# Patient Record
Sex: Female | Born: 1940
Health system: Southern US, Community
[De-identification: ages and names within clinical notes are randomized; demographics above are authoritative.]

## PROBLEM LIST (undated history)

## (undated) DIAGNOSIS — Z9289 Personal history of other medical treatment: Secondary | ICD-10-CM

## (undated) DIAGNOSIS — D649 Anemia, unspecified: Secondary | ICD-10-CM

## (undated) DIAGNOSIS — K759 Inflammatory liver disease, unspecified: Secondary | ICD-10-CM

## (undated) DIAGNOSIS — C3490 Malignant neoplasm of unspecified part of unspecified bronchus or lung: Secondary | ICD-10-CM

## (undated) DIAGNOSIS — I10 Essential (primary) hypertension: Secondary | ICD-10-CM

## (undated) DIAGNOSIS — N186 End stage renal disease: Secondary | ICD-10-CM

## (undated) DIAGNOSIS — Z992 Dependence on renal dialysis: Secondary | ICD-10-CM

## (undated) DIAGNOSIS — E785 Hyperlipidemia, unspecified: Secondary | ICD-10-CM

## (undated) DIAGNOSIS — Z85528 Personal history of other malignant neoplasm of kidney: Secondary | ICD-10-CM

## (undated) HISTORY — DX: Hyperlipidemia, unspecified: E78.5

## (undated) HISTORY — PX: CATARACT EXTRACTION: SUR2

---

## 2002-08-04 ENCOUNTER — Encounter: Payer: Self-pay | Admitting: Emergency Medicine

## 2002-08-04 ENCOUNTER — Inpatient Hospital Stay (HOSPITAL_COMMUNITY): Admission: EM | Admit: 2002-08-04 | Discharge: 2002-08-07 | Payer: Self-pay | Admitting: Emergency Medicine

## 2002-08-04 ENCOUNTER — Encounter (INDEPENDENT_AMBULATORY_CARE_PROVIDER_SITE_OTHER): Payer: Self-pay | Admitting: *Deleted

## 2002-08-08 ENCOUNTER — Encounter: Payer: Self-pay | Admitting: Gastroenterology

## 2002-08-08 ENCOUNTER — Ambulatory Visit (HOSPITAL_COMMUNITY): Admission: RE | Admit: 2002-08-08 | Discharge: 2002-08-08 | Payer: Self-pay | Admitting: Gastroenterology

## 2005-04-27 ENCOUNTER — Emergency Department (HOSPITAL_COMMUNITY): Admission: EM | Admit: 2005-04-27 | Discharge: 2005-04-27 | Payer: Self-pay | Admitting: Family Medicine

## 2009-04-03 ENCOUNTER — Ambulatory Visit (HOSPITAL_COMMUNITY): Admission: RE | Admit: 2009-04-03 | Discharge: 2009-04-03 | Payer: Self-pay | Admitting: Family Medicine

## 2009-08-03 ENCOUNTER — Observation Stay (HOSPITAL_COMMUNITY): Admission: EM | Admit: 2009-08-03 | Discharge: 2009-08-04 | Payer: Self-pay | Admitting: Emergency Medicine

## 2011-03-21 LAB — HEPATIC FUNCTION PANEL
ALT: 33 U/L (ref 0–35)
Total Protein: 7.5 g/dL (ref 6.0–8.3)

## 2011-03-21 LAB — DIFFERENTIAL
Basophils Absolute: 0 10*3/uL (ref 0.0–0.1)
Eosinophils Absolute: 0.1 10*3/uL (ref 0.0–0.7)
Monocytes Absolute: 1.4 10*3/uL — ABNORMAL HIGH (ref 0.1–1.0)
Neutrophils Relative %: 69 % (ref 43–77)

## 2011-03-21 LAB — CROSSMATCH: Antibody Screen: NEGATIVE

## 2011-03-21 LAB — HAPTOGLOBIN: Haptoglobin: 124 mg/dL (ref 16–200)

## 2011-03-21 LAB — BASIC METABOLIC PANEL
BUN: 10 mg/dL (ref 6–23)
CO2: 27 mEq/L (ref 19–32)
Calcium: 9.2 mg/dL (ref 8.4–10.5)
Chloride: 106 mEq/L (ref 96–112)
Creatinine, Ser: 0.59 mg/dL (ref 0.4–1.2)
GFR calc Af Amer: >60 mL/min (ref >60)
GFR calc non Af Amer: >60 mL/min (ref >60)
Glucose, Bld: 115 mg/dL — ABNORMAL HIGH (ref 70–99)
Potassium: 3.5 mEq/L (ref 3.5–5.1)
Sodium: 138 mEq/L (ref 135–145)

## 2011-03-21 LAB — CBC
HCT: 35.4 % — ABNORMAL LOW (ref 36.0–46.0)
Hemoglobin: 11.3 g/dL — ABNORMAL LOW (ref 12.0–15.0)
MCHC: 29.4 g/dL — ABNORMAL LOW (ref 30.0–36.0)
Platelets: 234 10*3/uL (ref 150–400)
Platelets: 260 10*3/uL (ref 150–400)
Platelets: 300 10*3/uL (ref 150–400)
RDW: 20.4 % — ABNORMAL HIGH (ref 11.5–15.5)
RDW: 23.9 % — ABNORMAL HIGH (ref 11.5–15.5)
WBC: 8.4 10*3/uL (ref 4.0–10.5)
WBC: 8.6 10*3/uL (ref 4.0–10.5)

## 2011-03-21 LAB — URINALYSIS, ROUTINE W REFLEX MICROSCOPIC
Nitrite: NEGATIVE
Specific Gravity, Urine: 1.007 (ref 1.005–1.030)
Urobilinogen, UA: 0.2 mg/dL (ref 0.0–1.0)

## 2011-03-21 LAB — IRON AND TIBC
Iron: 11 ug/dL — ABNORMAL LOW (ref 42–135)
UIBC: 498 ug/dL

## 2011-03-21 LAB — HEMOCCULT GUIAC POC 1CARD (OFFICE): Fecal Occult Bld: POSITIVE

## 2011-03-21 LAB — PROTIME-INR
INR: 1 (ref 0.00–1.49)
Prothrombin Time: 13.4 seconds (ref 11.6–15.2)

## 2011-03-21 LAB — POCT CARDIAC MARKERS: Myoglobin, poc: 49.2 ng/mL (ref 12–200)

## 2011-03-21 LAB — RETICULOCYTES: Retic Ct Pct: 4.2 % — ABNORMAL HIGH (ref 0.4–3.1)

## 2011-03-21 LAB — T4: T4, Total: 8.5 ug/dL (ref 5.0–12.5)

## 2011-03-21 LAB — LACTATE DEHYDROGENASE: LDH: 158 U/L (ref 94–250)

## 2011-03-21 LAB — ABO/RH: ABO/RH(D): O POS

## 2011-03-21 LAB — FERRITIN: Ferritin: <1 ng/mL — ABNORMAL LOW (ref 10–291)

## 2011-03-25 LAB — COMPREHENSIVE METABOLIC PANEL
ALT: 36 U/L — ABNORMAL HIGH (ref 0–35)
Alkaline Phosphatase: 73 U/L (ref 39–117)
BUN: 10 mg/dL (ref 6–23)
CO2: 32 mEq/L (ref 19–32)
GFR calc non Af Amer: >60 mL/min (ref >60)
Glucose, Bld: 131 mg/dL — ABNORMAL HIGH (ref 70–99)
Potassium: 3.9 mEq/L (ref 3.5–5.1)
Total Protein: 7.4 g/dL (ref 6.0–8.3)

## 2011-03-25 LAB — CBC
HCT: 39.7 % (ref 36.0–46.0)
Hemoglobin: 13.1 g/dL (ref 12.0–15.0)
MCHC: 33.1 g/dL (ref 30.0–36.0)
RBC: 5.08 MIL/uL (ref 3.87–5.11)
RDW: 17.9 % — ABNORMAL HIGH (ref 11.5–15.5)

## 2011-03-25 LAB — LIPID PANEL
Cholesterol: 182 mg/dL (ref 0–200)
LDL Cholesterol: 133 mg/dL — ABNORMAL HIGH (ref 0–99)
Total CHOL/HDL Ratio: 4.7 RATIO

## 2011-04-28 NOTE — Discharge Summary (Signed)
Cassandra Dyer, Cassandra Dyer               ACCOUNT NO.:  [id number]   MEDICAL RECORD NO.:  [id number]          PATIENT TYPE:  INP   LOCATION:  [id number]                         FACILITY:  [facility]   PHYSICIAN:  Reyne Dumas, MD       DATE OF BIRTH:  04-11-1941   DATE OF ADMISSION:  08/02/2009  DATE OF DISCHARGE:  08/04/2009                               DISCHARGE SUMMARY   PRIMARY CARE PHYSICIAN:  Silvio Clayman. Kennon Holter, MD.   [facility]   DISCHARGE DIAGNOSES:  1. Microcytic anemia likely secondary to gastrointestinal blood loss.  2. Hypertension.  3. Dyslipidemia.   SUBJECTIVE:  This is a 70 year old African American female with a  history of hypertension, dyslipidemia, and anemia, who presents with  progressive fatigue and weakness over the last 1 month.  The patient  complained of difficulties with climbing stairs.  She had a similar  episode about 7 years ago when she was evaluated extensively by [facility]  and no obvious source of bleeding was found.  Of note is that the  patient had a normal CBC on April 03, 2009, with a hemoglobin of 13.1  and hematocrit of 39.7.  The patient reported that she had started  taking aspirin on her own without notifying her primary care Atticus Lemberger  about 2 months ago prior to this admission.  She denied any hematemesis,  denied any melena, blood in her stool, or black tarry stool.  On initial  admission and assessment, the patient was found to be hemodynamically  stable with systolic blood pressure in the 130s.  She was, however,  found to have a 3/6 systolic ejection murmur probably related to her  anemia.  On admission,  initial hemoglobin was found to be 5.4.  Liver  function tests were within normal limits.  Cardiac markers were  unremarkable.   HOSPITAL COURSE:  1. Anemia:  The patient was found to have microcytic anemia with a      hemoglobin of 5.4.  Initially, she was transfused with 3 units of      packed red blood  cells following which her hemoglobin came up to      10.7.  The patient had normal coagulation profile, no evidence of      any hemolysis with a normal LDH and a normal haptoglobin.  The      patient was otherwise found to be hemodynamically stable.  I      notified Dr. Ronald Lobo, who was on call on August 03, 2009,      about the patient's anemia, and after our discussion it was      concluded that since the patient did not have any acute evidence of      any ongoing bleeding, she can wait for an outpatient workup.  Dr.      Cristina Gong reassured that he will give the patient a call next week.      Since the patient's hemoglobin has been stable without any active      bleeding, the patient is being discharged home.  The patient was up  and about ambulating the hallways at the time of my final      assessment of the patient prior to discharge.  She is found to be      100% on room air, not tachycardic, and completely stable.  I have      advised her to call Dr. Cristina Gong first thing next week.  I have      provided her with a phone number for Dr. Osborn Coho office.  2. Hypertension:  The patient was continued on her Norvasc and      hydrochlorothiazide.   DISCHARGE MEDICATIONS:  1. Norvasc 10 mg p.o. daily.  2. Ascorbic acid 500 mg p.o. daily.  3. Ferrous sulfate 325 p.o. t.i.d.  4. Hydrochlorothiazide 25 mg p.o. daily.  5. Protonix 40 mg p.o. twice a day.  6. Pravastatin 20 mg p.o. daily.   MEDICATIONS DISCONTINUED:  Aspirin.      Reyne Dumas, MD  Electronically Signed     NA/MEDQ  D:  08/04/2009  T:  08/04/2009  Job:  [id number]

## 2011-04-28 NOTE — H&P (Signed)
[name], Dyer NO.:  [id number]   MEDICAL RECORD NO.:  [id number]          PATIENT TYPE:  EMS   LOCATION:  MAJO                         FACILITY:  [facility]   PHYSICIAN:  Karlyn Agee, M.D. DATE OF BIRTH:  [birth year]   DATE OF ADMISSION:  08/02/2009  DATE OF DISCHARGE:                              HISTORY & PHYSICAL   PRIMARY CARE PHYSICIAN:  Lindwood Qua, M.D.   CHIEF COMPLAINT:  Chronic fatigue and shortness of breath.   HISTORY OF PRESENT ILLNESS:  This is a 70 year old African American lady  with history of hypertension, hypercholesterolemia and anemia who  presents with progressive fatigue and weakness over the past 1 month.  She has increasing difficulty climbing stairs and feels that her legs  will give out.  She had similar symptoms 7 years ago and was found to be  anemic requiring transfusions.  She had upper and lower endoscopy at  that time and she reports that both were normal.  She has had no chest  pain, dizziness or syncope.  She had no diaphoresis.  She denies any  lower extremity edema.  Says she sleeps very well at night and no PND  nor orthopnea.  The patient was evaluated in the emergency room and  found to be severely anemic and the hospitalist service called to assist  with management.   PAST MEDICAL HISTORY:  1. Anemia.  2. Hypertension.  3. Hyperlipidemia.   MEDICATIONS:  1. Pravastatin 20 mg daily.  2. Hydrochlorothiazide 25 mg daily.  3. Amlodipine 10 mg daily.  4. Aspirin 325 mg daily.  5. Vitamin C daily.   ALLERGIES:  No known drug allergies.   SOCIAL HISTORY:  She denies tobacco, alcohol or illicit drug use.  She  works as a Forensic scientist.   FAMILY HISTORY:  Significant for mother with coronary artery disease, a  father who died of pancreatic cancer and her siblings tend to have  hypertension.   REVIEW OF SYSTEMS:  On a 10-point review of systems, surprisingly  unremarkable,   PHYSICAL EXAMINATION:  Very  pleasant, middle-aged African American lady,  lying flat on the stretcher, in no acute distress.  VITALS:  Her temperature is 97.5.  Her pulse is 90, respirations 18,  blood pressure 132/55.  She is saturating at 99% on 2 liters.  Her pupils are round equal and reactive.  Her mucous membranes are  markedly pale.  She is anicteric.  No cervical lymphadenopathy or  thyromegaly.  No jugular venous distention.  No carotid bruit.  CHEST:  Clear to auscultation bilaterally.  CARDIOVASCULAR SYSTEM:  Regular rhythm, 3/6 systolic murmur.  ABDOMEN: Slightly obese, soft, nontender.  No masses.  EXTREMITIES:  Without edema.  She has 3+ bounding dorsalis pedis pulses  bilaterally.  SKIN:  Warm and dry.  She has no ulcerations.  CENTRAL NERVOUS SYSTEM: Cranial nerves II-XII are grossly intact.  She  has no focal neurologic deficit.   LABORATORY DATA:  Her WBC is 7.6 with a hemoglobin of 5.4 that compares  to a hemoglobin in April of this year which was 74.1.  She had MCV of  67.6 and a platelet of 300.  Her serum chemistry is unremarkable.  Sodium is 138, potassium 3.5, chloride 106, CO2 27, glucose is 115, BUN  10, creatinine 0.59, calcium 9.2.  Liver functions were not done.  Cardiac markers completely normal with undetectable troponins and CK-MB.  An anemia panel and haptoglobin are pending.  Her EKG shows sinus  rhythm, possible left atrial abnormality, mild ST depression in anterior  leads compared to a normal EKG in March 15, 2009.   ASSESSMENT:  1. Severe microcytic anemia.  2. History of hypertension.  3. Mild hyperglycemia.  4. History of hyperlipidemia.   PLAN:  Will admit this lady for transfusion and further evaluation  depending on results of her anemia panel.  This lady, after transfusion,  can likely have continued evaluation as an outpatient.      Karlyn Agee, M.D.  Electronically Signed     LC/MEDQ  D:  08/03/2009  T:  08/03/2009  Job:  [id number]   cc:   Lindwood Qua, MD  Ronald Lobo, M.D.

## 2011-05-01 NOTE — Op Note (Signed)
NAMEHAKIMA, Cassandra Dyer                         ACCOUNT NO.:  [id number]   MEDICAL RECORD NO.:  [id number]                   PATIENT TYPE:  INP   LOCATION:  2041                                 FACILITY:  [facility]   PHYSICIAN:  Val Schiavo DICTATOR                    DATE OF BIRTH:  October 14, 1941   DATE OF PROCEDURE:  08/07/2002  DATE OF DISCHARGE:  08/07/2002                                 OPERATIVE REPORT   PROCEDURE:  Colonoscopy with biopsies.   INDICATIONS:  A 70 year old female who presented to the hospital with  Hemoccult-positive stool and weakness and edema and was found to have  profound anemia with a hemoglobin of 3.6, an MCV of 62, and marked iron  deficiency on chemical studies.  Upper endoscopy was unrevealing for a  source of anemia.   FINDINGS:  Essentially normal exam.  Tiny rectal polyps removed.   DESCRIPTION OF PROCEDURE:  The nature, purpose, and risks of the procedure  had been discussed with the patient, who provided consent.  This procedure  was done immediately following her upper endoscopy.  Total sedation for the  two procedures was fentanyl 75 mcg and Versed 8 mg IV without arrhythmias or  desaturation.   The Olympus adjustable-tension pediatric video colonoscope was advanced to  the cecum.  The tip of the scope was able to be nubbed into the orifice of  the terminal ileum and there appeared to be normal ileal mucosa, but free  cannulation of the terminal ileum could not be achieved despite fairly  prolonged efforts.  Pullback was then performed.  The quality of the prep  was excellent, and it is felt that all areas were well-seen.   There were some tiny 2-3 mm hyperplastic-appearing sessile rectal polyps,  removed by cold biopsy.  No large polyps, cancer, colitis, vascular  malformations, or diverticular disease were observed in the colon.  Retroflexion was not performed in the rectum due to the proximity of the  rectal biopsies.   I then recolonoscoped  the patient all the way to the cecum and brought the  scope back again, again not identifying any source of anemia.   The patient tolerated the procedure well, and there were no apparent  complications.   IMPRESSION:  1. Diminutive rectal polyps.  2. No source of anemia identified.   PLAN:  Small bowel series.  Discharge okay for later today.  She will need  follow-up Hemoccults and monitoring of her hemoglobin as an outpatient, and  this will probably be arranged through [facility], since she  has no primary physician.                                              Quan Cybulski DICTATOR   DD/MEDQ  D:  08/07/2002  T:  08/09/2002  Job:  [id number]   cc:   [facility]

## 2011-05-01 NOTE — Consult Note (Signed)
NAME:  Cassandra Dyer, Cassandra Dyer                         ACCOUNT NO.:  [id number]   MEDICAL RECORD NO.:  [id number]                   PATIENT TYPE:  INP   LOCATION:  2041                                 FACILITY:  [facility]   PHYSICIAN:  Cleotis Nipper, M.D.             DATE OF BIRTH:  [birth year]   DATE OF CONSULTATION:  08/04/2002  DATE OF DISCHARGE:  08/04/2002                           GASTROENTEROLOGY CONSULTATION   HISTORY OF PRESENT ILLNESS:  Benito Mccreedy, M.D., of the [facility] asked me to see this 71 year old African American female  who is being admitted today to the hospital as an unassigned patient since  she has no primary physician.   She presented to the emergency room with a history of weakness and lower  extremity edema of perhaps a month's duration and was found to be severely  anemic with a hemoglobin of 5 and an MCV in the 60s.  Hemoccult status is  not currently known.  She is being admitted for transfusion and evaluation.   The patient herself has no risk factors for GI blood loss other than the use  of a daily aspirin for prophylaxis purposes.  She has no localizing GI  symptoms, such as anorexia, weight loss, dyspepsia, dysphagia, reflux,  abdominal pain, constipation, diarrhea, rectal bleeding, or guaiac stools.  There is no family history colorectal neoplasia or ulcer disease.  The  patient herself has no history of such diseases.   ALLERGIES:  No known allergies.   OUTPATIENT MEDICATIONS:  1. Vitamin C.  2. Daily aspirin.   PAST MEDICAL HISTORY:  Operations:  None.  Prior Hospitalizations:  None.  Chronic Medical Illnesses:  None.  No prior history of anemia.   HABITS:  The patient smokes one pack per day, but is a nondrinker.   FAMILY HISTORY:  Negative for ulcers or colon cancer and I believe also  negative for gallbladder disease.   SOCIAL HISTORY:  Lives with family.  Works as Curator.   REVIEW OF SYMPTOMS:  See HPI.   PHYSICAL  EXAMINATION:  GENERAL APPEARANCE:  A pleasant, light-skinned, but  also somewhat pale African American female in no evident distress  whatsoever.  She is anicteric.  CHEST:  Clear.  HEART:  Without gallops, rubs, murmurs, clicks, or arrhythmias.  ABDOMEN:  Grossly without mass or tenderness, but is somewhat obese.  RECTAL:  Exam was not performed at this time, but it is anticipated that  stool will be obtained for occult blood.   LABORATORY DATA:  The hemoglobin on initial presentation was 3.6 with an MCV  of 62, platelets 293,000, and white count 6600.  Chemistry panel normal,  except for minimal elevation of SGOT.   IMPRESSION:  Severe microcytic anemia without obvious localizing  gastrointestinal tract symptoms or physical findings with her only risk  factor being daily aspirin exposure.   PLAN:  Proceed to endoscopic and fluoroscopic evaluation.  This will  be done  after the patient is transfused and then prepped over the next couple of  days.  The nature, purpose, and risks of the procedures were reviewed with  the patient, as well as alternatives, such as radiographic evaluation of the  gastrointestinal tract and she is agreeable to proceed.  Further management  will depend on the endoscopic findings.                                                Cleotis Nipper, M.D.    RVB/MEDQ  D:  08/04/2002  T:  08/07/2002  Job:  [id number]

## 2011-05-01 NOTE — Op Note (Signed)
   NAMEKEYLE, URSIN                         ACCOUNT NO.:  [id number]   MEDICAL RECORD NO.:  [id number]                   PATIENT TYPE:  INP   LOCATION:  2041                                 FACILITY:  [facility]   PHYSICIAN:  Cleotis Nipper, M.D.             DATE OF BIRTH:  March 09, 1941   DATE OF PROCEDURE:  08/07/2002  DATE OF DISCHARGE:                                 OPERATIVE REPORT   PROCEDURE PERFORMED:  Upper endoscopy.   ENDOSCOPIST:  Cleotis Nipper, M.D.   INDICATIONS FOR PROCEDURE:  The patient is a 70 year old female who  presented to the hospital with hemoccult positive stool and profound iron  deficiency anemia with a hemoglobin of 3.6, MCV of 62 and undetectable iron  in her blood and a ferritin of 4.  Stool was hemoccult positive by rectal  exam.   FINDINGS:  Normal exam.   DESCRIPTION OF PROCEDURE:  The nature, purpose and risks of the procedure  had been discussed with the patient and she provided written consent.  She  came in a fasted state to the endoscopy unit and was sedated was fentanyl 75  mcg and Versed 8 mg IV prior to and during this procedure and the  colonoscopy which followed it.  The Olympus video endoscope was passed under  direct vision without difficulty.  The larynx was grossly unremarkable.   The esophageal mucosa was normal without evidence of reflux esophagitis,  Barrett's esophagus, varices, infection or neoplasia or any  ring, stricture  or significant  hiatal hernia.  The stomach contained no significant  abnormalities, no residual, no gastritis, no erosions, ulcers, polyps or  masses including a retroflex view of the proximal stomach.  The pylorus,  duodenal bulb and second duodenum looked normal.   The scope was then removed from the patient.  The patient tolerated the  procedure well.  No biopsies were obtained.  There were no apparent  complications.   IMPRESSION:  Normal upper endoscopy.  No source of profound anemia  endoscopically evident.   PLAN:  Proceed to colonoscopic evaluation.                                                 Cleotis Nipper, M.D.    RVB/MEDQ  D:  08/07/2002  T:  08/09/2002  Job:  [id number]   cc:   [facility]

## 2011-05-01 NOTE — Discharge Summary (Signed)
Cassandra Dyer, Cassandra Dyer                         ACCOUNT NO.:  [id number]   MEDICAL RECORD NO.:  [id number]                   PATIENT TYPE:  INP   LOCATION:  2041                                 FACILITY:  [facility]   PHYSICIAN:  Benito Mccreedy, M.D.             DATE OF BIRTH:  Aug 19, 1941   DATE OF ADMISSION:  08/04/2002  DATE OF DISCHARGE:  08/07/2002                                 DISCHARGE SUMMARY   DISCHARGE DIAGNOSIS:  1. Critical symptomatic microcytic anemia.  Admission hemoglobin of 3.6,     discharge hemoglobin 10.7, status post total four units of packed red     blood cells transfusion during hospitalization.     Esophagogastroduodenoscopy done today by Dr. Youlanda Mighty Buccini was     unremarkable.  For outpatient small bowel follow through and evolution by     Dr. Cristina Gong as an outpatient.  2. Cigarette smoking with a 43-pack-year history.   DISCHARGE MEDICATIONS:  1. Multivitamin one tablet p.o. q.d.  2. Ferrous sulfate 375 mg p.o. t.i.d.  3. Protonix 40 mg p.o. q.d.   LABORATORY DATA:  Discharge labs show white blood cell count 9.0, hemoglobin  10.7, hematocrit __________ , MCV 80.9, platelet count 228,000.  Sodium 139,  potassium 4.4, chloride 109, cO2 21, glucose 114, BUN 11, creatinine 0.7,  calcium 9.2.   ACTIVITY:  As tolerated.   DISCHARGE DIET:  Regular.   SPECIAL INSTRUCTIONS:  Patient is instructed to stop cigarette smoking.  She  is to report to her physician if she experiences any problems including  passing any dark stools, experiences dyspnea on exertion or easy  fatigability.   FOLLOW UP:  Follow up will be with Dr. Cristina Gong in one to two weeks.  Patient  has Dr. Osborn Coho card and she is to call for follow up appointment.  She  will be scheduled for outpatient small bowel follow through prior to  discharge.  She is to come in for the testing and follow up with Dr. Cristina Gong  as mentioned above.  She is to contact Dr. Cristina Gong regarding procedures  esophagogastroduodenoscopy and colonoscopy  done  on August 07, 2002.   CONDITION ON DISCHARGE:  Improved and stable.  Patient is discharged home  with her family.   REASON FOR ADMISSION:  Critical symptomatic anemia.   HOSPITAL COURSE:  The patient presented with dyspnea on exertion, easy  fatigability.  She did not have any history of bright red blood per rectum.  She has been taking aspirin daily for the last one year.  She had history of  menorrhagia as a child but has not had any menstruation since 1995.  She had  hemoglobin on admission of  3.6 with MCV of 61.6, symptomatic anemia.  Her  iron was less than 10 with Ferritin of 4 which is consistent with severe  iron deficiency anemia.  The patient received a total of 4 units of packed  red blood cells transfusion and with this was associated improvement in her  presenting symptomatology.  Esophagogastroduodenoscopy and colonoscopy were  done as mentioned above, both were negative and therefore the patient is  scheduled for outpatient small bowel follow through to evaluate evolution  and to rule out any small bowel pathology.  The patient's hemoglobin has  remained stable since the transfusion and patient does not have any  indication of active bleed.  The patient is therefore going to be discharged  home today to follow up as an outpatient with Dr. Cristina Gong for further  evaluation of her iron deficiency anemia, ruling out any GI involvement.  She will be scheduled for small bowel follow through testing prior to  leaving the hospital today.                                               Benito Mccreedy, M.D.    GO/MEDQ  D:  08/07/2002  T:  08/09/2002  Job:  [id number]   cc:   Cleotis Nipper, M.D.  [address] [place]  [place], [place] [address]  Fax: [phone]

## 2013-10-12 ENCOUNTER — Other Ambulatory Visit: Payer: Self-pay | Admitting: Family Medicine

## 2013-10-12 DIAGNOSIS — Z1231 Encounter for screening mammogram for malignant neoplasm of breast: Secondary | ICD-10-CM

## 2013-11-16 ENCOUNTER — Ambulatory Visit
Admission: RE | Admit: 2013-11-16 | Discharge: 2013-11-16 | Disposition: A | Discharge status: Home / Self Care | Payer: Medicare Other | Source: Ambulatory Visit | Attending: Family Medicine | Admitting: Family Medicine

## 2013-11-16 DIAGNOSIS — Z1231 Encounter for screening mammogram for malignant neoplasm of breast: Secondary | ICD-10-CM

## 2015-02-07 DIAGNOSIS — J329 Chronic sinusitis, unspecified: Secondary | ICD-10-CM | POA: Diagnosis not present

## 2015-02-13 ENCOUNTER — Other Ambulatory Visit: Payer: Self-pay

## 2015-02-13 DIAGNOSIS — Z1231 Encounter for screening mammogram for malignant neoplasm of breast: Secondary | ICD-10-CM

## 2015-02-18 ENCOUNTER — Ambulatory Visit
Admission: RE | Admit: 2015-02-18 | Discharge: 2015-02-18 | Disposition: A | Discharge status: Home / Self Care | Payer: Medicare Other | Source: Ambulatory Visit

## 2015-02-18 DIAGNOSIS — I10 Essential (primary) hypertension: Secondary | ICD-10-CM | POA: Diagnosis not present

## 2015-02-18 DIAGNOSIS — E780 Pure hypercholesterolemia: Secondary | ICD-10-CM | POA: Diagnosis not present

## 2015-02-18 DIAGNOSIS — Z1231 Encounter for screening mammogram for malignant neoplasm of breast: Secondary | ICD-10-CM | POA: Diagnosis not present

## 2015-02-18 DIAGNOSIS — E785 Hyperlipidemia, unspecified: Secondary | ICD-10-CM | POA: Diagnosis not present

## 2015-02-18 LAB — LIPID PANEL
CHOLESTEROL: 170 mg/dL (ref 0–200)
HDL: 43 mg/dL (ref 35–70)
Triglycerides: 84 mg/dL (ref 40–160)

## 2015-02-18 LAB — HEPATIC FUNCTION PANEL
ALT: 7 U/L (ref 7–35)
AST: 28 U/L (ref 13–35)
Alkaline Phosphatase: 69 U/L (ref 25–125)

## 2015-02-18 LAB — BASIC METABOLIC PANEL
BUN: 15 mg/dL (ref 4–21)
Glucose: 122 mg/dL
POTASSIUM: 4.5 mmol/L (ref 3.4–5.3)
SODIUM: 137 mmol/L (ref 137–147)

## 2015-02-18 LAB — CBC AND DIFFERENTIAL
HCT: 44 % (ref 36–46)
Hemoglobin: 14.3 g/dL (ref 12.0–16.0)
PLATELETS: 327 10*3/uL (ref 150–399)
WBC: 9.8 10^3/mL

## 2015-02-26 DIAGNOSIS — E559 Vitamin D deficiency, unspecified: Secondary | ICD-10-CM | POA: Diagnosis not present

## 2015-05-16 ENCOUNTER — Encounter (HOSPITAL_COMMUNITY): Payer: Self-pay | Admitting: Emergency Medicine

## 2015-05-16 ENCOUNTER — Emergency Department (INDEPENDENT_AMBULATORY_CARE_PROVIDER_SITE_OTHER)
Admission: EM | Admit: 2015-05-16 | Discharge: 2015-05-16 | Disposition: A | Discharge status: Home / Self Care | Payer: Medicare Other | Source: Home / Self Care

## 2015-05-16 DIAGNOSIS — S61219A Laceration without foreign body of unspecified finger without damage to nail, initial encounter: Secondary | ICD-10-CM | POA: Diagnosis not present

## 2015-05-16 DIAGNOSIS — Z23 Encounter for immunization: Secondary | ICD-10-CM

## 2015-05-16 HISTORY — DX: Essential (primary) hypertension: I10

## 2015-05-16 MED ORDER — TETANUS-DIPHTH-ACELL PERTUSSIS 5-2.5-18.5 LF-MCG/0.5 IM SUSP
0.5000 mL | Freq: Once | INTRAMUSCULAR | Status: AC
  Administered 2015-05-16: 0.5 mL via INTRAMUSCULAR

## 2015-05-16 MED ORDER — TETANUS-DIPHTH-ACELL PERTUSSIS 5-2.5-18.5 LF-MCG/0.5 IM SUSP
INTRAMUSCULAR | Status: AC
  Filled 2015-05-16: qty 0.5

## 2015-05-16 MED ORDER — MUPIROCIN 2 % EX OINT: 1.0000 "application " | TOPICAL_OINTMENT | Freq: Two times a day (BID) | CUTANEOUS | Status: DC

## 2015-05-16 NOTE — Discharge Instructions (Signed)
Thank you for coming in today.\  Non-Sutured Laceration A laceration is a cut or wound that goes through all layers of the skin and into the tissue just beneath the skin. Usually, these are stitched up or held together with tape or glue shortly after the injury occurred. However, if several or more hours have passed before getting care, too many germs (bacteria) get into the laceration. Stitching it closed would bring the risk of infection. If your health care provider feels your laceration is too old, it may be left open and then bandaged to allow healing from the bottom layer up. HOME CARE INSTRUCTIONS   Change the bandage (dressing) 2 times a day or as directed by your health care provider.  If the dressing or packing gauze sticks, soak it off with soapy water.  When you re-bandage your laceration, make sure that the dressing or packing gauze goes all the way to the bottom of the laceration. The top of the laceration is kept open so it can heal from the bottom up. There is less chance for infection with this method.  Wash the area with soap and water 2 times a day to remove all the creams or ointments, if used. Rinse off the soap. Pat the area dry with a clean towel. Look for signs of infection, such as redness, swelling, or a red line that goes away from the laceration.  Re-apply creams or ointments if they were used to bandage the laceration. This helps keep the bandage from sticking.  If the bandage becomes wet, dirty, or has a bad smell, change it as soon as possible.  Only take medicine as directed by your health care provider. You might need a tetanus shot now if:  You have no idea when you had the last one.  You have never had a tetanus shot before.  Your laceration had dirt in it.  Your laceration was dirty, and your last tetanus shot was more than 7 years ago.  Your laceration was clean, and your last tetanus shot was more than 10 years ago. If you need a tetanus shot, and  you decide not to get one, there is a rare chance of getting tetanus. Sickness from tetanus can be serious. If you got a tetanus shot, your arm may swell and get red and warm to the touch at the shot site. This is common and not a problem. SEEK MEDICAL CARE IF:   You have redness, swelling, or increasing pain in the laceration.  You notice a red line that goes away from your laceration.  You have pus coming from the laceration.  You have a fever.  You notice a bad smell coming from the laceration or dressing.  You notice something coming out of the laceration, such as wood or glass.  Your laceration is on your hand or foot and you are unable to properly move a finger or toe.  You have severe swelling around the laceration, causing pain and numbness.  You notice a change in color in your arm, hand, leg, or foot. MAKE SURE YOU:   Understand these instructions.  Will watch your condition.  Will get help right away if you are not doing well or get worse. Document Released: 10/28/2006 Document Revised: 12/05/2013 Document Reviewed: 05/20/2009 [facility] Patient Information 2015 [place], [place]. This information is not intended to replace advice given to you by your health care provider. Make sure you discuss any questions you have with your health care provider.

## 2015-05-16 NOTE — ED Provider Notes (Signed)
Cassandra Dyer is a 74 y.o. female who presents to Urgent Care today for laceration to the right fifth digit. Patient suffered a laceration at home 6 days ago on class. The laceration is located at the dorsal and ulnar aspect of her right fifth PIP. She cleaned the wound with hydrogen peroxide and applied Neosporin. She's been applying hydrogen peroxide twice daily since the initial incident. She is here today for evaluation. She denies any pain or fever or difficulty with extension flexion or numbness. She cannot recall her last tetanus vaccine.   No past medical history on file. No past surgical history on file. History  Substance Use Topics  . Smoking status: Not on file  . Smokeless tobacco: Not on file  . Alcohol Use: Not on file   ROS as above Medications: No current facility-administered medications for this encounter.   Current Outpatient Prescriptions  Medication Sig Dispense Refill  . mupirocin ointment (BACTROBAN) 2 % Apply 1 application topically 2 (two) times daily. 30 g 0   Allergies not on file   Exam:  BP 145/55 mmHg  Pulse 88  Temp(Src) 98.2 F (36.8 C) (Oral)  Resp 16  SpO2 99% Gen: Well NAD HEENT: EOMI,  MMM Lungs: Normal work of breathing. CTABL Heart: RRR no MRG Abd: NABS, Soft. Nondistended, Nontender Exts: Brisk capillary refill, warm and well perfused.  Skin: Curvilinear laceration well-appearing at the right dorsal fifth PIP. No discharge or erythema. Hand extension and flexion strength intact. Pulses capillary refill and sensation intact  No results found for this or any previous visit (from the past 24 hour(s)). No results found.  Assessment and Plan: 74 y.o. female with old laceration. Tetanus vaccination provided. Patient will treat with mupirocin and about appointment and will return as needed.  Discussed warning signs or symptoms. Please see discharge instructions. Patient expresses understanding.     Gregor Hams, MD 05/16/15 [phone]

## 2015-05-16 NOTE — ED Notes (Signed)
Pt cut her finger 6 days ago and is here to get checked out.

## 2015-06-05 DIAGNOSIS — E559 Vitamin D deficiency, unspecified: Secondary | ICD-10-CM | POA: Diagnosis not present

## 2016-02-18 LAB — LIPID PANEL: LDL Cholesterol: 110 mg/dL

## 2016-06-13 ENCOUNTER — Emergency Department (HOSPITAL_COMMUNITY)
Admission: EM | Admit: 2016-06-13 | Discharge: 2016-06-14 | Disposition: A | Discharge status: Home / Self Care | Payer: Medicare Other | Attending: Emergency Medicine | Admitting: Emergency Medicine

## 2016-06-13 ENCOUNTER — Emergency Department (HOSPITAL_COMMUNITY): Payer: Medicare Other

## 2016-06-13 ENCOUNTER — Encounter (HOSPITAL_COMMUNITY): Payer: Self-pay

## 2016-06-13 DIAGNOSIS — R112 Nausea with vomiting, unspecified: Secondary | ICD-10-CM

## 2016-06-13 DIAGNOSIS — R52 Pain, unspecified: Secondary | ICD-10-CM

## 2016-06-13 DIAGNOSIS — N2889 Other specified disorders of kidney and ureter: Secondary | ICD-10-CM | POA: Diagnosis not present

## 2016-06-13 DIAGNOSIS — I10 Essential (primary) hypertension: Secondary | ICD-10-CM | POA: Diagnosis not present

## 2016-06-13 DIAGNOSIS — E860 Dehydration: Secondary | ICD-10-CM | POA: Insufficient documentation

## 2016-06-13 DIAGNOSIS — R509 Fever, unspecified: Secondary | ICD-10-CM | POA: Insufficient documentation

## 2016-06-13 DIAGNOSIS — R1110 Vomiting, unspecified: Secondary | ICD-10-CM | POA: Diagnosis present

## 2016-06-13 DIAGNOSIS — Z79899 Other long term (current) drug therapy: Secondary | ICD-10-CM | POA: Diagnosis not present

## 2016-06-13 DIAGNOSIS — R1011 Right upper quadrant pain: Secondary | ICD-10-CM | POA: Diagnosis not present

## 2016-06-13 DIAGNOSIS — R109 Unspecified abdominal pain: Secondary | ICD-10-CM | POA: Diagnosis not present

## 2016-06-13 LAB — COMPREHENSIVE METABOLIC PANEL
ALT: 44 U/L (ref 14–54)
AST: 142 U/L — AB (ref 15–41)
Albumin: 3.4 g/dL — ABNORMAL LOW (ref 3.5–5.0)
Alkaline Phosphatase: 64 U/L (ref 38–126)
Anion gap: 9 (ref 5–15)
BUN: 9 mg/dL (ref 6–20)
CO2: 23 mmol/L (ref 22–32)
Calcium: 9.5 mg/dL (ref 8.9–10.3)
Chloride: 103 mmol/L (ref 101–111)
Creatinine, Ser: 0.98 mg/dL (ref 0.44–1.00)
GFR, EST NON AFRICAN AMERICAN: 56 mL/min — AB (ref >60)
Glucose, Bld: 146 mg/dL — ABNORMAL HIGH (ref 65–99)
POTASSIUM: 4.5 mmol/L (ref 3.5–5.1)
Sodium: 135 mmol/L (ref 135–145)
TOTAL PROTEIN: 7 g/dL (ref 6.5–8.1)
Total Bilirubin: 1.3 mg/dL — ABNORMAL HIGH (ref 0.3–1.2)

## 2016-06-13 LAB — URINALYSIS, ROUTINE W REFLEX MICROSCOPIC
Glucose, UA: 100 mg/dL — AB
Ketones, ur: 15 mg/dL — AB
Nitrite: POSITIVE — AB
Protein, ur: 100 mg/dL — AB
Specific Gravity, Urine: 1.039 — ABNORMAL HIGH (ref 1.005–1.030)
pH: 5.5 (ref 5.0–8.0)

## 2016-06-13 LAB — TROPONIN I: Troponin I: <0.03 ng/mL (ref <0.03)

## 2016-06-13 LAB — LACTIC ACID, PLASMA: Lactic Acid, Venous: 1.7 mmol/L (ref 0.5–1.9)

## 2016-06-13 LAB — URINE MICROSCOPIC-ADD ON

## 2016-06-13 LAB — CBC
HCT: 33.8 % — ABNORMAL LOW (ref 36.0–46.0)
Hemoglobin: 10.2 g/dL — ABNORMAL LOW (ref 12.0–15.0)
MCH: 21 pg — ABNORMAL LOW (ref 26.0–34.0)
MCHC: 30.2 g/dL (ref 30.0–36.0)
MCV: 69.5 fL — ABNORMAL LOW (ref 78.0–100.0)
Platelets: 265 10*3/uL (ref 150–400)
RBC: 4.86 MIL/uL (ref 3.87–5.11)
RDW: 18.4 % — ABNORMAL HIGH (ref 11.5–15.5)
WBC: 27.3 10*3/uL — ABNORMAL HIGH (ref 4.0–10.5)

## 2016-06-13 LAB — LIPASE, BLOOD: Lipase: 20 U/L (ref 11–51)

## 2016-06-13 MED ORDER — IOPAMIDOL (ISOVUE-300) INJECTION 61%
INTRAVENOUS | Status: AC
  Administered 2016-06-14: 100 mL
  Filled 2016-06-13: qty 100

## 2016-06-13 MED ORDER — SODIUM CHLORIDE 0.9 % IV BOLUS (SEPSIS)
1000.0000 mL | Freq: Once | INTRAVENOUS | Status: AC
  Administered 2016-06-13: 1000 mL via INTRAVENOUS

## 2016-06-13 MED ORDER — MORPHINE SULFATE (PF) 4 MG/ML IV SOLN
4.0000 mg | Freq: Once | INTRAVENOUS | Status: AC
  Administered 2016-06-13: 4 mg via INTRAVENOUS
  Filled 2016-06-13: qty 1

## 2016-06-13 MED ORDER — ACETAMINOPHEN 325 MG PO TABS
325.0000 mg | ORAL_TABLET | Freq: Once | ORAL | Status: AC
  Administered 2016-06-13: 325 mg via ORAL
  Filled 2016-06-13: qty 1

## 2016-06-13 MED ORDER — ONDANSETRON HCL 4 MG/2ML IJ SOLN
4.0000 mg | Freq: Once | INTRAMUSCULAR | Status: AC
  Administered 2016-06-13: 4 mg via INTRAVENOUS
  Filled 2016-06-13: qty 2

## 2016-06-13 NOTE — ED Notes (Signed)
Patient complains of right sided abdominal pain and flank pain with vomiting since Thursday, denies diarrhea. Denies dysuria

## 2016-06-13 NOTE — ED Provider Notes (Signed)
CSN: [id number]     Arrival date & time 06/13/16  1812 History   First MD Initiated Contact with Patient 06/13/16 2049     Chief Complaint  Patient presents with  . Emesis   PT IS A 75 YO BF WHO SAID THAT SHE HAS BEEN HAVING RIGHT SIDED PAIN AND VOMITING SINCE Thursday (JUNE 29).  PT SAID THAT SHE HAS BEEN UNABLE TO EAT OR DRINK ANYTHING.  (Consider location/radiation/quality/duration/timing/severity/associated sxs/prior Treatment) Patient is a 75 y.o. female presenting with vomiting. The history is provided by the patient.  Emesis Severity:  Moderate Timing:  Intermittent Quality:  Bilious material Progression:  Unchanged Chronicity:  New Recent urination:  Decreased Context: not post-tussive and not self-induced   Relieved by:  Nothing Associated symptoms: abdominal pain     Past Medical History  Diagnosis Date  . Hypertension    History reviewed. No pertinent past surgical history. No family history on file. Social History  Substance Use Topics  . Smoking status: Never Smoker   . Smokeless tobacco: None  . Alcohol Use: Yes   OB History    No data available     Review of Systems  Gastrointestinal: Positive for vomiting and abdominal pain.  All other systems reviewed and are negative.     Allergies  Review of patient's allergies indicates no known allergies.  Home Medications   Prior to Admission medications   Medication Sig Start Date End Date Taking? Authorizing Provider  Calcium Carbonate-Vit D-Min (CALCIUM/VITAMIN D/MINERALS) 600-200 MG-UNIT TABS Take 1 tablet by mouth daily.   Yes Historical Provider, MD  NON FORMULARY Take 1 tablet by mouth daily. Female balance   Yes Historical Provider, MD  NON FORMULARY Take 1 tablet by mouth daily. megajuice   Yes Historical Provider, MD  Omega-3 Fatty Acids (FISH OIL) 1200 MG CAPS Take 1,200 mg by mouth daily.   Yes Historical Provider, MD  vitamin C (ASCORBIC ACID) 500 MG tablet Take 500 mg by mouth daily.   Yes  Historical Provider, MD  vitamin E 400 UNIT capsule Take 400 Units by mouth daily.   Yes Historical Provider, MD  amLODipine (NORVASC) 10 MG tablet Take 10 mg by mouth daily.    Historical Provider, MD  hydrochlorothiazide (HYDRODIURIL) 25 MG tablet Take 25 mg by mouth daily.    Historical Provider, MD  HYDROcodone-acetaminophen (NORCO/VICODIN) 5-325 MG tablet Take 2 tablets by mouth every 4 (four) hours as needed. 06/14/16   Tanna Furry, MD  mupirocin ointment (BACTROBAN) 2 % Apply 1 application topically 2 (two) times daily. 05/16/15   Gregor Hams, MD  ondansetron (ZOFRAN ODT) 4 MG disintegrating tablet Take 1 tablet (4 mg total) by mouth every 8 (eight) hours as needed for nausea. 06/14/16   Tanna Furry, MD  pravastatin (PRAVACHOL) 20 MG tablet Take 20 mg by mouth daily.    Historical Provider, MD  valsartan (DIOVAN) 160 MG tablet Take 160 mg by mouth daily. Reported on 06/13/2016    Historical Provider, MD   BP 126/48 mmHg  Pulse 91  Temp(Src) 100.3 F (37.9 C) (Oral)  Resp 21  SpO2 95% Physical Exam  Constitutional: She is oriented to person, place, and time. She appears well-developed and well-nourished.  HENT:  Head: Normocephalic and atraumatic.  Right Ear: External ear normal.  Left Ear: External ear normal.  Nose: Nose normal.  Mouth/Throat: Oropharynx is clear and moist.  Eyes: Conjunctivae and EOM are normal. Pupils are equal, round, and reactive to light.  Neck: Normal  range of motion. Neck supple.  Cardiovascular: Normal rate, regular rhythm, normal heart sounds and intact distal pulses.   Pulmonary/Chest: Effort normal and breath sounds normal.  Abdominal: Soft. Bowel sounds are normal. There is tenderness in the right upper quadrant.  Musculoskeletal: Normal range of motion.  Neurological: She is alert and oriented to person, place, and time.  Skin: Skin is warm and dry.  Psychiatric: She has a normal mood and affect. Her behavior is normal. Judgment and thought content normal.   Nursing note and vitals reviewed.   ED Course  Procedures (including critical care time) Labs Review Labs Reviewed  COMPREHENSIVE METABOLIC PANEL - Abnormal; Notable for the following:    Glucose, Bld 146 (*)    Albumin 3.4 (*)    AST 142 (*)    Total Bilirubin 1.3 (*)    GFR calc non Af Amer 56 (*)    All other components within normal limits  CBC - Abnormal; Notable for the following:    WBC 27.3 (*)    Hemoglobin 10.2 (*)    HCT 33.8 (*)    MCV 69.5 (*)    MCH 21.0 (*)    RDW 18.4 (*)    All other components within normal limits  URINALYSIS, ROUTINE W REFLEX MICROSCOPIC (NOT AT [facility]) - Abnormal; Notable for the following:    Color, Urine ORANGE (*)    APPearance TURBID (*)    Specific Gravity, Urine 1.039 (*)    Glucose, UA 100 (*)    Hgb urine dipstick LARGE (*)    Bilirubin Urine MODERATE (*)    Ketones, ur 15 (*)    Protein, ur 100 (*)    Nitrite POSITIVE (*)    Leukocytes, UA SMALL (*)    All other components within normal limits  URINE MICROSCOPIC-ADD ON - Abnormal; Notable for the following:    Squamous Epithelial / LPF 0-5 (*)    Bacteria, UA FEW (*)    Casts HYALINE CASTS (*)    All other components within normal limits  CULTURE, BLOOD (ROUTINE X 2)  CULTURE, BLOOD (ROUTINE X 2)  LIPASE, BLOOD  LACTIC ACID, PLASMA  LACTIC ACID, PLASMA  TROPONIN I    Imaging Review Ct Abdomen Pelvis W Contrast  06/14/2016  CLINICAL DATA:  Right flank pain for 2 days.  Vomiting. EXAM: CT ABDOMEN AND PELVIS WITH CONTRAST TECHNIQUE: Multidetector CT imaging of the abdomen and pelvis was performed using the standard protocol following bolus administration of intravenous contrast. CONTRAST:  166m ISOVUE-300 IOPAMIDOL (ISOVUE-300) INJECTION 61% COMPARISON:  Ultrasound 06/13/2016 FINDINGS: There is an 8.5 x 8.8 x 10.0 cm complex mass of the right kidney midpole to lower pole. This appeared solid on ultrasound. It may be partially cystic on CT, perhaps with internal hemorrhage.  The left kidney is normal. Ureters and urinary bladder are normal. There are a few hypodensities in the liver, well under a cm, likely benign but not conclusively characterized. Gallbladder and bile ducts are unremarkable. Pancreas, spleen and left adrenal are normal. There is a 1.8 cm right adrenal nodule. Stomach, small bowel and appendix are normal.  Colon is normal. There are multiple calcified fibroids of the uterus measuring up to 3.5 cm. Probable ovarian cysts bilaterally, measuring up to 3 cm. No ascites. No significant skeletal lesion. Atelectatic appearing posterior lung base opacities bilaterally. No nodules are evident in the visible lung bases. IMPRESSION: 1. Complex 10 cm mass of the right kidney, likely a neoplasm. There may be internal hemorrhage.  MRI without/with contrast would provide optimal characterization. 2. Indeterminate 1.8 cm right adrenal nodule. This could also be evaluated on MRI. 3. Multiple small hypodensities in the liver, more likely benign cysts but not conclusively characterized. 4. Multiple calcified uterine fibroids. 5. Bilateral ovarian cysts, measuring up to 3 cm. In a patient this age, pelvic sonography is advisable to characterize. Electronically Signed   By: Andreas Newport M.D.   On: 06/14/2016 00:51   US Abdomen Limited Ruq  06/13/2016  CLINICAL DATA:  Right upper quadrant pain radiating into the back and right flank. Nausea and vomiting. Three days duration. EXAM: US ABDOMEN LIMITED - RIGHT UPPER QUADRANT COMPARISON:  None. FINDINGS: Gallbladder: No gallstones or wall thickening visualized. No sonographic Murphy sign noted by sonographer. Common bile duct: Diameter: 3.9 mm Liver: No focal lesion identified. Within normal limits in parenchymal echogenicity. Other: There is a solid 10 cm mass of the lower pole right kidney. This likely is a primary neoplasm. IMPRESSION: 1. Normal liver, gallbladder and bile ducts. 2. 10 cm solid mass of the lower pole right kidney. This  probably is a primary neoplasm. Recommend CT of the abdomen and pelvis with contrast. These results were called by telephone at the time of interpretation on 06/13/2016 at 10:39 pm to Dr. Isla Pence , who verbally acknowledged these results. Electronically Signed   By: Andreas Newport M.D.   On: 06/13/2016 22:40   I have personally reviewed and evaluated these images and lab results as part of my medical decision-making.   EKG Interpretation   Date/Time:  Saturday June 13 2016 21:06:43 EDT Ventricular Rate:  91 PR Interval:    QRS Duration: 73 QT Interval:  335 QTC Calculation: 413 R Axis:   51 Text Interpretation:  Sinus rhythm Probable left atrial enlargement  Confirmed by Temia Debroux MD, Frank Pilger ([id number]) on 06/13/2016 9:16:14 PM      MDM  PT SIGNED OUT TO DR. Jeneen Rinks PENDING CT ABD/PELVIS RESULTS. Final diagnoses:  Pain  Non-intractable vomiting with nausea, vomiting of unspecified type  Dehydration  Renal mass, right  Fever, unspecified fever cause      Isla Pence, MD 06/14/16 [phone]

## 2016-06-14 ENCOUNTER — Encounter (HOSPITAL_COMMUNITY): Payer: Self-pay | Admitting: Radiology

## 2016-06-14 ENCOUNTER — Emergency Department (HOSPITAL_COMMUNITY): Payer: Medicare Other

## 2016-06-14 LAB — LACTIC ACID, PLASMA: Lactic Acid, Venous: 1.4 mmol/L (ref 0.5–1.9)

## 2016-06-14 MED ORDER — ONDANSETRON 4 MG PO TBDP: 4.0000 mg | ORAL_TABLET | Freq: Three times a day (TID) | ORAL | Status: DC | PRN

## 2016-06-14 MED ORDER — HYDROCODONE-ACETAMINOPHEN 5-325 MG PO TABS: 2.0000 | ORAL_TABLET | ORAL | Status: DC | PRN

## 2016-06-14 NOTE — ED Provider Notes (Signed)
I met with patient and reviewed the results of her CT scan for her.  Splint if this is diagnostic of renal cell carcinoma and will require urology follow-up and most likely will require complete nephrectomy. Fortunately, her left kidney appears radiographically normal and her creatinine is normal. Disturbance detail. She was able to repeat back to me that she will have to follow-up with urology and likely would have surgery. Her pain is well-controlled at this time. I think her symptoms likely developed with some internal contained hemorrhage within the tumor. She is comfortable and symptom free now.  Tanna Furry, MD 06/14/16 [phone]

## 2016-06-14 NOTE — Discharge Instructions (Signed)
Your testing today including a CAT scan show a large mass in your right kidney that is likely a renal cell cancer.  You have been given the name and information for Dr.Ottelin, a urologist with [facility]. Call Monday morning to make an appointment for follow-up as soon as possible.  Zofran for nausea, Vicodin for pain. Return to the ER with new or worsening symptoms including worsening pain, vomiting, lightheadedness/passing out.

## 2016-06-18 LAB — CULTURE, BLOOD (ROUTINE X 2)
Culture: NO GROWTH
Culture: NO GROWTH

## 2016-09-24 DIAGNOSIS — D49511 Neoplasm of unspecified behavior of right kidney: Secondary | ICD-10-CM | POA: Diagnosis not present

## 2016-09-24 DIAGNOSIS — D4411 Neoplasm of uncertain behavior of right adrenal gland: Secondary | ICD-10-CM | POA: Diagnosis not present

## 2016-11-02 DIAGNOSIS — D4411 Neoplasm of uncertain behavior of right adrenal gland: Secondary | ICD-10-CM | POA: Diagnosis not present

## 2016-11-02 DIAGNOSIS — D49511 Neoplasm of unspecified behavior of right kidney: Secondary | ICD-10-CM | POA: Diagnosis not present

## 2016-11-18 ENCOUNTER — Other Ambulatory Visit: Payer: Self-pay | Admitting: Urology

## 2016-11-18 MED ORDER — MAGNESIUM CITRATE PO SOLN: 1.0000 | Freq: Once | ORAL | Status: DC

## 2016-12-14 DIAGNOSIS — C649 Malignant neoplasm of unspecified kidney, except renal pelvis: Secondary | ICD-10-CM

## 2016-12-14 HISTORY — DX: Malignant neoplasm of unspecified kidney, except renal pelvis: C64.9

## 2017-01-11 NOTE — Patient Instructions (Signed)
SAHVANNA MCMANIGAL  01/11/2017   Your procedure is scheduled on: 01/13/2017    Report to [facility]  Entrance take [place]  elevators to 3rd floor to  [place] at    [place] AM.  Call this number if you have problems the morning of surgery [phone]   Remember: ONLY 1 PERSON MAY GO WITH YOU TO SHORT STAY TO GET  READY MORNING OF YOUR SURGERY.  Do not eat food or drink liquids :After Midnight.     Take these medicines the morning of surgery with A SIP OF WATER: none                                 You may not have any metal on your body including hair pins and              piercings  Do not wear jewelry, make-up, lotions, powders or perfumes, deodorant             Do not wear nail polish.  Do not shave  48 hours prior to surgery.                 Do not bring valuables to the hospital. [place].  Contacts, dentures or bridgework may not be worn into surgery.  Leave suitcase in the car. After surgery it may be brought to your room.                       Please read over the following fact sheets you were given: _____________________________________________________________________             [facility] - Preparing for Surgery Before surgery, you can play an important role.  Because skin is not sterile, your skin needs to be as free of germs as possible.  You can reduce the number of germs on your skin by washing with CHG (chlorahexidine gluconate) soap before surgery.  CHG is an antiseptic cleaner which kills germs and bonds with the skin to continue killing germs even after washing. Please DO NOT use if you have an allergy to CHG or antibacterial soaps.  If your skin becomes reddened/irritated stop using the CHG and inform your nurse when you arrive at Short Stay. Do not shave (including legs and underarms) for at least 48 hours prior to the first CHG shower.  You may shave your face/neck. Please  follow these instructions carefully:  1.  Shower with CHG Soap the night before surgery and the  morning of Surgery.  2.  If you choose to wash your hair, wash your hair first as usual with your  normal  shampoo.  3.  After you shampoo, rinse your hair and body thoroughly to remove the  shampoo.                           4.  Use CHG as you would any other liquid soap.  You can apply chg directly  to the skin and wash                       Gently with a scrungie or clean washcloth.  5.  Apply the CHG Soap to your body ONLY FROM THE NECK DOWN.   Do not use on face/ open                           Wound or open sores. Avoid contact with eyes, ears mouth and genitals (private parts).                       Wash face,  Genitals (private parts) with your normal soap.             6.  Wash thoroughly, paying special attention to the area where your surgery  will be performed.  7.  Thoroughly rinse your body with warm water from the neck down.  8.  DO NOT shower/wash with your normal soap after using and rinsing off  the CHG Soap.                9.  Pat yourself dry with a clean towel.            10.  Wear clean pajamas.            11.  Place clean sheets on your bed the night of your first shower and do not  sleep with pets. Day of Surgery : Do not apply any lotions/deodorants the morning of surgery.  Please wear clean clothes to the hospital/surgery center.  FAILURE TO FOLLOW THESE INSTRUCTIONS MAY RESULT IN THE CANCELLATION OF YOUR SURGERY PATIENT SIGNATURE_________________________________  NURSE SIGNATURE__________________________________  ________________________________________________________________________  WHAT IS A BLOOD TRANSFUSION? Blood Transfusion Information  A transfusion is the replacement of blood or some of its parts. Blood is made up of multiple cells which provide different functions.  Red blood cells carry oxygen and are used for blood loss replacement.  White blood cells  fight against infection.  Platelets control bleeding.  Plasma helps clot blood.  Other blood products are available for specialized needs, such as hemophilia or other clotting disorders. BEFORE THE TRANSFUSION  Who gives blood for transfusions?   Healthy volunteers who are fully evaluated to make sure their blood is safe. This is blood bank blood. Transfusion therapy is the safest it has ever been in the practice of medicine. Before blood is taken from a donor, a complete history is taken to make sure that person has no history of diseases nor engages in risky social behavior (examples are intravenous drug use or sexual activity with multiple partners). The donor's travel history is screened to minimize risk of transmitting infections, such as malaria. The donated blood is tested for signs of infectious diseases, such as HIV and hepatitis. The blood is then tested to be sure it is compatible with you in order to minimize the chance of a transfusion reaction. If you or a relative donates blood, this is often done in anticipation of surgery and is not appropriate for emergency situations. It takes many days to process the donated blood. RISKS AND COMPLICATIONS Although transfusion therapy is very safe and saves many lives, the main dangers of transfusion include:   Getting an infectious disease.  Developing a transfusion reaction. This is an allergic reaction to something in the blood you were given. Every precaution is taken to prevent this. The decision to have a blood transfusion has been considered carefully by your caregiver before blood is given. Blood is not given unless the benefits outweigh the risks. AFTER THE TRANSFUSION  Right after  receiving a blood transfusion, you will usually feel much better and more energetic. This is especially true if your red blood cells have gotten low (anemic). The transfusion raises the level of the red blood cells which carry oxygen, and this usually  causes an energy increase.  The nurse administering the transfusion will monitor you carefully for complications. HOME CARE INSTRUCTIONS  No special instructions are needed after a transfusion. You may find your energy is better. Speak with your caregiver about any limitations on activity for underlying diseases you may have. SEEK MEDICAL CARE IF:   Your condition is not improving after your transfusion.  You develop redness or irritation at the intravenous (IV) site. SEEK IMMEDIATE MEDICAL CARE IF:  Any of the following symptoms occur over the next 12 hours:  Shaking chills.  You have a temperature by mouth above 102 F (38.9 C), not controlled by medicine.  Chest, back, or muscle pain.  People around you feel you are not acting correctly or are confused.  Shortness of breath or difficulty breathing.  Dizziness and fainting.  You get a rash or develop hives.  You have a decrease in urine output.  Your urine turns a dark color or changes to pink, red, or brown. Any of the following symptoms occur over the next 10 days:  You have a temperature by mouth above 102 F (38.9 C), not controlled by medicine.  Shortness of breath.  Weakness after normal activity.  The white part of the eye turns yellow (jaundice).  You have a decrease in the amount of urine or are urinating less often.  Your urine turns a dark color or changes to pink, red, or brown. Document Released: 11/27/2000 Document Revised: 02/22/2012 Document Reviewed: 07/16/2008 [facility] Patient Information 2014 [place], [place].  _______________________________________________________________________

## 2017-01-12 ENCOUNTER — Encounter (HOSPITAL_COMMUNITY): Payer: Self-pay

## 2017-01-12 ENCOUNTER — Encounter (HOSPITAL_COMMUNITY)
Admission: RE | Admit: 2017-01-12 | Discharge: 2017-01-12 | Disposition: A | Discharge status: Home / Self Care | Payer: Medicare Other | Source: Ambulatory Visit | Attending: Urology | Admitting: Urology

## 2017-01-12 HISTORY — DX: Inflammatory liver disease, unspecified: K75.9

## 2017-01-12 LAB — CBC
HCT: 34.7 % — ABNORMAL LOW (ref 36.0–46.0)
HEMOGLOBIN: 10.3 g/dL — AB (ref 12.0–15.0)
MCH: 20.9 pg — AB (ref 26.0–34.0)
MCHC: 29.7 g/dL — AB (ref 30.0–36.0)
MCV: 70.2 fL — ABNORMAL LOW (ref 78.0–100.0)
Platelets: 291 10*3/uL (ref 150–400)
RBC: 4.94 MIL/uL (ref 3.87–5.11)
RDW: 19 % — AB (ref 11.5–15.5)
WBC: 7.6 10*3/uL (ref 4.0–10.5)

## 2017-01-12 LAB — COMPREHENSIVE METABOLIC PANEL
ALK PHOS: 68 U/L (ref 38–126)
ALT: 29 U/L (ref 14–54)
AST: 35 U/L (ref 15–41)
Albumin: 4.1 g/dL (ref 3.5–5.0)
Anion gap: 7 (ref 5–15)
BUN: 10 mg/dL (ref 6–20)
CALCIUM: 9.6 mg/dL (ref 8.9–10.3)
CO2: 27 mmol/L (ref 22–32)
CREATININE: 0.79 mg/dL (ref 0.44–1.00)
Chloride: 107 mmol/L (ref 101–111)
GFR calc non Af Amer: >60 mL/min (ref >60)
GLUCOSE: 106 mg/dL — AB (ref 65–99)
Potassium: 4.3 mmol/L (ref 3.5–5.1)
SODIUM: 141 mmol/L (ref 135–145)
Total Bilirubin: 0.4 mg/dL (ref 0.3–1.2)
Total Protein: 7.2 g/dL (ref 6.5–8.1)

## 2017-01-12 LAB — ABO/RH: ABO/RH(D): O POS

## 2017-01-12 NOTE — Progress Notes (Signed)
EKG-06/15/16-epic

## 2017-01-13 ENCOUNTER — Inpatient Hospital Stay (HOSPITAL_COMMUNITY): Payer: Medicare Other | Admitting: Anesthesiology

## 2017-01-13 ENCOUNTER — Encounter (HOSPITAL_COMMUNITY): Payer: Self-pay | Admitting: *Deleted

## 2017-01-13 ENCOUNTER — Encounter (HOSPITAL_COMMUNITY)
Admission: RE | Disposition: A | Discharge status: Home / Self Care | Payer: Self-pay | Source: Ambulatory Visit | Attending: Urology

## 2017-01-13 ENCOUNTER — Inpatient Hospital Stay (HOSPITAL_COMMUNITY)
Admission: RE | Admit: 2017-01-13 | Discharge: 2017-01-14 | DRG: 661 | Disposition: A | Discharge status: Home / Self Care | Payer: Medicare Other | Source: Ambulatory Visit | Attending: Urology | Admitting: Urology

## 2017-01-13 DIAGNOSIS — N2889 Other specified disorders of kidney and ureter: Secondary | ICD-10-CM | POA: Diagnosis present

## 2017-01-13 DIAGNOSIS — E279 Disorder of adrenal gland, unspecified: Secondary | ICD-10-CM | POA: Diagnosis present

## 2017-01-13 DIAGNOSIS — Z87891 Personal history of nicotine dependence: Secondary | ICD-10-CM

## 2017-01-13 DIAGNOSIS — C641 Malignant neoplasm of right kidney, except renal pelvis: Secondary | ICD-10-CM | POA: Diagnosis not present

## 2017-01-13 DIAGNOSIS — I10 Essential (primary) hypertension: Secondary | ICD-10-CM | POA: Diagnosis present

## 2017-01-13 HISTORY — DX: Personal history of other medical treatment: Z92.89

## 2017-01-13 HISTORY — PX: ROBOT ASSISTED LAPAROSCOPIC NEPHRECTOMY: SHX5140

## 2017-01-13 HISTORY — DX: Anemia, unspecified: D64.9

## 2017-01-13 LAB — HEMOGLOBIN AND HEMATOCRIT, BLOOD
HCT: 34.5 % — ABNORMAL LOW (ref 36.0–46.0)
HEMOGLOBIN: 10.4 g/dL — AB (ref 12.0–15.0)

## 2017-01-13 SURGERY — NEPHRECTOMY, RADICAL, ROBOT-ASSISTED, LAPAROSCOPIC, ADULT
Anesthesia: General | Laterality: Right

## 2017-01-13 MED ORDER — FENTANYL CITRATE (PF) 250 MCG/5ML IJ SOLN
INTRAMUSCULAR | Status: AC
  Filled 2017-01-13: qty 5

## 2017-01-13 MED ORDER — CEFAZOLIN SODIUM-DEXTROSE 2-4 GM/100ML-% IV SOLN
INTRAVENOUS | Status: AC
  Filled 2017-01-13: qty 100

## 2017-01-13 MED ORDER — PROPOFOL 10 MG/ML IV BOLUS
INTRAVENOUS | Status: DC | PRN
  Administered 2017-01-13: 150 mg via INTRAVENOUS

## 2017-01-13 MED ORDER — LACTATED RINGERS IV SOLN
INTRAVENOUS | Status: DC
  Administered 2017-01-13 (×2): via INTRAVENOUS

## 2017-01-13 MED ORDER — LABETALOL HCL 5 MG/ML IV SOLN
INTRAVENOUS | Status: AC
  Filled 2017-01-13: qty 4

## 2017-01-13 MED ORDER — HYDROMORPHONE HCL 2 MG/ML IJ SOLN
INTRAMUSCULAR | Status: AC
  Filled 2017-01-13: qty 1

## 2017-01-13 MED ORDER — LIDOCAINE 2% (20 MG/ML) 5 ML SYRINGE
INTRAMUSCULAR | Status: DC | PRN
  Administered 2017-01-13: 100 mg via INTRAVENOUS

## 2017-01-13 MED ORDER — HYDROMORPHONE HCL 1 MG/ML IJ SOLN
0.2500 mg | INTRAMUSCULAR | Status: DC | PRN
  Administered 2017-01-13 (×4): 0.5 mg via INTRAVENOUS

## 2017-01-13 MED ORDER — LACTATED RINGERS IR SOLN
Status: DC | PRN
  Administered 2017-01-13: 1000 mL

## 2017-01-13 MED ORDER — SUCCINYLCHOLINE CHLORIDE 200 MG/10ML IV SOSY
PREFILLED_SYRINGE | INTRAVENOUS | Status: DC | PRN
  Administered 2017-01-13: 100 mg via INTRAVENOUS

## 2017-01-13 MED ORDER — HYDROMORPHONE HCL 1 MG/ML IJ SOLN
INTRAMUSCULAR | Status: DC | PRN
  Administered 2017-01-13: 0.5 mg via INTRAVENOUS

## 2017-01-13 MED ORDER — SODIUM CHLORIDE 0.9 % IJ SOLN
INTRAMUSCULAR | Status: AC
  Filled 2017-01-13: qty 50

## 2017-01-13 MED ORDER — SODIUM CHLORIDE 0.9 % IJ SOLN
INTRAMUSCULAR | Status: DC | PRN
  Administered 2017-01-13: 20 mL

## 2017-01-13 MED ORDER — BUPIVACAINE LIPOSOME 1.3 % IJ SUSP
INTRAMUSCULAR | Status: DC | PRN
  Administered 2017-01-13: 20 mL

## 2017-01-13 MED ORDER — STERILE WATER FOR IRRIGATION IR SOLN
Status: DC | PRN
  Administered 2017-01-13: 1000 mL

## 2017-01-13 MED ORDER — PROPOFOL 10 MG/ML IV BOLUS
INTRAVENOUS | Status: AC
  Filled 2017-01-13: qty 20

## 2017-01-13 MED ORDER — HYDROCODONE-ACETAMINOPHEN 5-325 MG PO TABS: 1.0000 | ORAL_TABLET | Freq: Four times a day (QID) | ORAL | 0 refills | Status: DC | PRN

## 2017-01-13 MED ORDER — ONDANSETRON HCL 4 MG/2ML IJ SOLN: 4.0000 mg | INTRAMUSCULAR | Status: DC | PRN

## 2017-01-13 MED ORDER — SUGAMMADEX SODIUM 200 MG/2ML IV SOLN
INTRAVENOUS | Status: DC | PRN
  Administered 2017-01-13: 200 mg via INTRAVENOUS

## 2017-01-13 MED ORDER — ROCURONIUM BROMIDE 10 MG/ML (PF) SYRINGE
PREFILLED_SYRINGE | INTRAVENOUS | Status: DC | PRN
  Administered 2017-01-13: 50 mg via INTRAVENOUS

## 2017-01-13 MED ORDER — ONDANSETRON HCL 4 MG/2ML IJ SOLN
INTRAMUSCULAR | Status: DC | PRN
  Administered 2017-01-13: 4 mg via INTRAVENOUS

## 2017-01-13 MED ORDER — ONDANSETRON HCL 4 MG/2ML IJ SOLN
INTRAMUSCULAR | Status: AC
  Filled 2017-01-13: qty 2

## 2017-01-13 MED ORDER — HYDRALAZINE HCL 20 MG/ML IJ SOLN
2.0000 mg | INTRAMUSCULAR | Status: AC | PRN
  Administered 2017-01-13 (×2): 2 mg via INTRAVENOUS

## 2017-01-13 MED ORDER — HYDROMORPHONE HCL 2 MG/ML IJ SOLN: 0.5000 mg | INTRAMUSCULAR | Status: DC | PRN

## 2017-01-13 MED ORDER — ROCURONIUM BROMIDE 50 MG/5ML IV SOSY
PREFILLED_SYRINGE | INTRAVENOUS | Status: AC
  Filled 2017-01-13: qty 5

## 2017-01-13 MED ORDER — DIPHENHYDRAMINE HCL 50 MG/ML IJ SOLN: 12.5000 mg | Freq: Four times a day (QID) | INTRAMUSCULAR | Status: DC | PRN

## 2017-01-13 MED ORDER — HYDROMORPHONE HCL 1 MG/ML IJ SOLN: 0.2500 mg | INTRAMUSCULAR | Status: DC | PRN

## 2017-01-13 MED ORDER — HYDRALAZINE HCL 20 MG/ML IJ SOLN
INTRAMUSCULAR | Status: AC
  Filled 2017-01-13: qty 1

## 2017-01-13 MED ORDER — DIPHENHYDRAMINE HCL 12.5 MG/5ML PO ELIX: 12.5000 mg | ORAL_SOLUTION | Freq: Four times a day (QID) | ORAL | Status: DC | PRN

## 2017-01-13 MED ORDER — DEXTROSE-NACL 5-0.45 % IV SOLN
INTRAVENOUS | Status: DC
  Administered 2017-01-13 – 2017-01-14 (×2): via INTRAVENOUS

## 2017-01-13 MED ORDER — FENTANYL CITRATE (PF) 100 MCG/2ML IJ SOLN
INTRAMUSCULAR | Status: DC | PRN
  Administered 2017-01-13 (×5): 50 ug via INTRAVENOUS

## 2017-01-13 MED ORDER — DEXAMETHASONE SODIUM PHOSPHATE 10 MG/ML IJ SOLN
INTRAMUSCULAR | Status: AC
  Filled 2017-01-13: qty 1

## 2017-01-13 MED ORDER — DEXAMETHASONE SODIUM PHOSPHATE 10 MG/ML IJ SOLN
INTRAMUSCULAR | Status: DC | PRN
  Administered 2017-01-13: 10 mg via INTRAVENOUS

## 2017-01-13 MED ORDER — CEFAZOLIN SODIUM-DEXTROSE 2-4 GM/100ML-% IV SOLN
2.0000 g | INTRAVENOUS | Status: AC
  Administered 2017-01-13: 2 g via INTRAVENOUS
  Filled 2017-01-13: qty 100

## 2017-01-13 MED ORDER — LABETALOL HCL 5 MG/ML IV SOLN
INTRAVENOUS | Status: DC | PRN
  Administered 2017-01-13: 5 mg via INTRAVENOUS
  Administered 2017-01-13: 10 mg via INTRAVENOUS

## 2017-01-13 MED ORDER — OXYCODONE HCL 5 MG PO TABS: 5.0000 mg | ORAL_TABLET | ORAL | Status: DC | PRN

## 2017-01-13 MED ORDER — BUPIVACAINE LIPOSOME 1.3 % IJ SUSP
INTRAMUSCULAR | Status: AC
  Filled 2017-01-13: qty 20

## 2017-01-13 MED ORDER — ACETAMINOPHEN 500 MG PO TABS
1000.0000 mg | ORAL_TABLET | Freq: Four times a day (QID) | ORAL | Status: AC
  Administered 2017-01-13 – 2017-01-14 (×4): 1000 mg via ORAL
  Filled 2017-01-13 (×4): qty 2

## 2017-01-13 SURGICAL SUPPLY — 58 items
ADH SKN CLS APL DERMABOND .7 (GAUZE/BANDAGES/DRESSINGS) ×1
BAG LAPAROSCOPIC 12 15 PORT 16 (BASKET) ×1 IMPLANT
BAG RETRIEVAL 12/15 (BASKET) ×4
CHLORAPREP W/TINT 26ML (MISCELLANEOUS) ×2 IMPLANT
CLIP LIGATING HEM O LOK PURPLE (MISCELLANEOUS) ×2 IMPLANT
CLIP LIGATING HEMO LOK XL GOLD (MISCELLANEOUS) ×3 IMPLANT
CLIP LIGATING HEMO O LOK GREEN (MISCELLANEOUS) ×1 IMPLANT
COVER TIP SHEARS 8 DVNC (MISCELLANEOUS) ×1 IMPLANT
COVER TIP SHEARS 8MM DA VINCI (MISCELLANEOUS) ×1
CUTTER ECHEON FLEX ENDO 45 340 (ENDOMECHANICALS) ×1 IMPLANT
DECANTER SPIKE VIAL GLASS SM (MISCELLANEOUS) ×2 IMPLANT
DERMABOND ADVANCED (GAUZE/BANDAGES/DRESSINGS) ×1
DERMABOND ADVANCED .7 DNX12 (GAUZE/BANDAGES/DRESSINGS) ×2 IMPLANT
DRAIN CHANNEL 15F RND FF 3/16 (WOUND CARE) IMPLANT
DRAPE ARM DVNC X/XI (DISPOSABLE) ×4 IMPLANT
DRAPE COLUMN DVNC XI (DISPOSABLE) ×1 IMPLANT
DRAPE DA VINCI XI ARM (DISPOSABLE) ×4
DRAPE DA VINCI XI COLUMN (DISPOSABLE) ×1
DRAPE INCISE IOBAN 66X45 STRL (DRAPES) ×2 IMPLANT
DRAPE LAPAROSCOPIC ABDOMINAL (DRAPES) ×2 IMPLANT
DRAPE SHEET LG 3/4 BI-LAMINATE (DRAPES) ×2 IMPLANT
ELECT PENCIL ROCKER SW 15FT (MISCELLANEOUS) ×2 IMPLANT
ELECT REM PT RETURN 9FT ADLT (ELECTROSURGICAL) ×2
ELECTRODE REM PT RTRN 9FT ADLT (ELECTROSURGICAL) ×2 IMPLANT
EVACUATOR SILICONE 100CC (DRAIN) IMPLANT
GLOVE BIO SURGEON STRL SZ 6.5 (GLOVE) ×2 IMPLANT
GLOVE BIOGEL M STRL SZ7.5 (GLOVE) ×4 IMPLANT
GOWN STRL REUS W/TWL LRG LVL3 (GOWN DISPOSABLE) ×7 IMPLANT
IRRIG SUCT STRYKERFLOW 2 WTIP (MISCELLANEOUS) ×2
IRRIGATION SUCT STRKRFLW 2 WTP (MISCELLANEOUS) IMPLANT
KIT BASIN OR (CUSTOM PROCEDURE TRAY) ×2 IMPLANT
LOOP VESSEL MAXI BLUE (MISCELLANEOUS) ×2 IMPLANT
NEEDLE INSUFFLATION 14GA 120MM (NEEDLE) ×2 IMPLANT
PORT ACCESS TROCAR AIRSEAL 12 (TROCAR) ×1 IMPLANT
PORT ACCESS TROCAR AIRSEAL 5M (TROCAR)
RELOAD STAPLE 60 2.6 WHT THN (STAPLE) IMPLANT
RELOAD STAPLER WHITE 60MM (STAPLE) IMPLANT
RELOAD WH ECHELON 45 (STAPLE) ×3 IMPLANT
SEAL CANN UNIV 5-8 DVNC XI (MISCELLANEOUS) ×4 IMPLANT
SEAL XI 5MM-8MM UNIVERSAL (MISCELLANEOUS) ×4
SET TRI-LUMEN FLTR TB AIRSEAL (TUBING) ×2 IMPLANT
SOLUTION ELECTROLUBE (MISCELLANEOUS) ×2 IMPLANT
SPONGE LAP 4X18 X RAY DECT (DISPOSABLE) ×2 IMPLANT
STAPLE ECHEON FLEX 60 POW ENDO (STAPLE) IMPLANT
STAPLER RELOAD WHITE 60MM (STAPLE)
SUT ETHILON 3 0 PS 1 (SUTURE) IMPLANT
SUT MNCRL AB 4-0 PS2 18 (SUTURE) ×5 IMPLANT
SUT PDS AB 1 CT1 27 (SUTURE) ×6 IMPLANT
SUT VICRYL 0 UR6 27IN ABS (SUTURE) IMPLANT
TAPE STRIPS DRAPE STRL (GAUZE/BANDAGES/DRESSINGS) ×1 IMPLANT
TOWEL OR NON WOVEN STRL DISP B (DISPOSABLE) ×4 IMPLANT
TRAY FOLEY W/METER SILVER 16FR (SET/KITS/TRAYS/PACK) ×1 IMPLANT
TRAY LAPAROSCOPIC (CUSTOM PROCEDURE TRAY) ×2 IMPLANT
TROCAR BLADELESS OPT 12M 100M (ENDOMECHANICALS) ×1 IMPLANT
TROCAR BLADELESS OPT 5 100 (ENDOMECHANICALS) ×1 IMPLANT
TROCAR UNIVERSAL OPT 12M 100M (ENDOMECHANICALS) ×2 IMPLANT
TROCAR XCEL 12X100 BLDLESS (ENDOMECHANICALS) ×2 IMPLANT
WATER STERILE IRR 1500ML POUR (IV SOLUTION) ×2 IMPLANT

## 2017-01-13 NOTE — Discharge Instructions (Signed)

## 2017-01-13 NOTE — Transfer of Care (Signed)
Immediate Anesthesia Transfer of Care Note  Patient: Cassandra Dyer  Procedure(s) Performed: Procedure(s): XI ROBOTIC ASSISTED LAPAROSCOPIC RADICAL NEPHRECTOMY WITH LYSIS OF ADHESION (Right)  Patient Location: PACU  Anesthesia Type:General  Level of Consciousness: sedated  Airway & Oxygen Therapy: Patient Spontanous Breathing and Patient connected to face mask oxygen  Post-op Assessment: Report given to RN and Post -op Vital signs reviewed and stable  Post vital signs: Reviewed and stable  Last Vitals:  Vitals:   01/13/17 0757  BP: (!) 166/79  Pulse: 76  Resp: 16  Temp: 36.9 C    Last Pain:  Vitals:   01/13/17 0757  TempSrc: Oral      Patients Stated Pain Goal: 2 (74/12/87 8676)  Complications: No apparent anesthesia complications

## 2017-01-13 NOTE — Progress Notes (Signed)
Notified Dr. Tresa Moore of patient's BP 173/61, stated to continue to monitor. Patient in no distress. Will continue to assess patient.

## 2017-01-13 NOTE — H&P (Signed)
Cassandra Dyer is an 76 y.o. female.    Chief Complaint: Pre-op RIGHT robotic radical nephrectomy  HPI:   1- Large Right Renal Cystic Neoplasm - 10cm nodular cystic right renal mass on ER CT on eval abd pain. Ipsilateral 2cm adrenal mass as well. 1 artery 1 vein right renovascular anatomy. NO hilar adenopathy. Cr 0.98 2017. NO electroylte abnormalities or h/o refractory HTN. No contralateral lesions.   2 - Right Adreneal Mass - 2 cm right adrenal mass as per above.   PMH sig fro HepC, HTN. NO CV disesae. NO priro surgeries. NO PCP at present.   Today "Cassandra Dyer" is seen to proceed with RIGHT robotic radical nephrectomy.     Past Medical History:  Diagnosis Date  . Hepatitis    hx of hep C - 10-20 years ago   . Hypertension    not taken b/p med in 2-3 years md deceased never went to another md     No past surgical history on file.  No family history on file. Social History:  reports that she quit smoking about 16 years ago. She has never used smokeless tobacco. She reports that she drinks alcohol. She reports that she does not use drugs.  Allergies: No Known Allergies  No prescriptions prior to admission.    Results for orders placed or performed during the hospital encounter of 01/12/17 (from the past 48 hour(s))  Type and screen All Cardiac and thoracic surgeries, spinal fusions, myomectomies, craniotomies, colon & liver resections, total joint revisions, same day c-section with placenta previa or accreta.     Status: None   Collection Time: 01/12/17  2:18 PM  Result Value Ref Range   ABO/RH(D) O POS    Antibody Screen NEG    Sample Expiration 01/26/2017    Extend sample reason NO TRANSFUSIONS OR PREGNANCY IN THE PAST 3 MONTHS   CBC     Status: Abnormal   Collection Time: 01/12/17  2:19 PM  Result Value Ref Range   WBC 7.6 4.0 - 10.5 K/uL   RBC 4.94 3.87 - 5.11 MIL/uL   Hemoglobin 10.3 (L) 12.0 - 15.0 g/dL   HCT 34.7 (L) 36.0 - 46.0 %   MCV 70.2 (L) 78.0 - 100.0 fL   MCH 20.9 (L) 26.0 - 34.0 pg   MCHC 29.7 (L) 30.0 - 36.0 g/dL   RDW 19.0 (H) 11.5 - 15.5 %   Platelets 291 150 - 400 K/uL  Comprehensive metabolic panel     Status: Abnormal   Collection Time: 01/12/17  2:19 PM  Result Value Ref Range   Sodium 141 135 - 145 mmol/L   Potassium 4.3 3.5 - 5.1 mmol/L   Chloride 107 101 - 111 mmol/L   CO2 27 22 - 32 mmol/L   Glucose, Bld 106 (H) 65 - 99 mg/dL   BUN 10 6 - 20 mg/dL   Creatinine, Ser 0.79 0.44 - 1.00 mg/dL   Calcium 9.6 8.9 - 10.3 mg/dL   Total Protein 7.2 6.5 - 8.1 g/dL   Albumin 4.1 3.5 - 5.0 g/dL   AST 35 15 - 41 U/L   ALT 29 14 - 54 U/L   Alkaline Phosphatase 68 38 - 126 U/L   Total Bilirubin 0.4 0.3 - 1.2 mg/dL   GFR calc non Af Amer >60 >60 mL/min   GFR calc Af Amer >60 >60 mL/min    Comment: (NOTE) The eGFR has been calculated using the CKD EPI equation. This calculation has not been  validated in all clinical situations. eGFR's persistently <60 mL/min signify possible Chronic Kidney Disease.    Anion gap 7 5 - 15  ABO/Rh     Status: None   Collection Time: 01/12/17  2:19 PM  Result Value Ref Range   ABO/RH(D) O POS    No results found.  Review of Systems  Constitutional: Negative.  Negative for chills and fever.  HENT: Negative.   Eyes: Negative.   Respiratory: Negative.   Cardiovascular: Negative.   Gastrointestinal: Negative.   Genitourinary: Negative.   Musculoskeletal: Negative.   Skin: Negative.   Neurological: Negative.   Endo/Heme/Allergies: Negative.   Psychiatric/Behavioral: Negative.     There were no vitals taken for this visit. Physical Exam  Constitutional: She appears well-developed.  Eyes: Pupils are equal, round, and reactive to light.  Neck: Normal range of motion.  Cardiovascular: Normal rate.   Respiratory: Effort normal.  GI: Soft.  Genitourinary:  Genitourinary Comments: No CVAT  Musculoskeletal: Normal range of motion.  Neurological: She is alert.  Skin: Skin is warm.  Psychiatric:  She has a normal mood and affect. Her behavior is normal. Judgment and thought content normal.     Assessment/Plan  Right renal mass highly concerning for cystic renal cell cancer. THis is quite large and ipsilateral adrenal mass does raise some concern about possible metastasis. As she has well controlled comorbitdy and good overall renal function I strongly geel this is best managed by right radical nephrectomy with formal adrenalectomy. Plan for robotic approach.   Risks, benefits, alternatives, expected peri-op course discussed in detail previously and reiterated today.    Alexis Frock, MD 01/13/2017, 6:42 AM

## 2017-01-13 NOTE — Anesthesia Postprocedure Evaluation (Signed)
Anesthesia Post Note  Patient: Cassandra Dyer  Procedure(s) Performed: Procedure(s) (LRB): XI ROBOTIC ASSISTED LAPAROSCOPIC RADICAL NEPHRECTOMY WITH LYSIS OF ADHESION (Right)  Patient location during evaluation: PACU Anesthesia Type: General Level of consciousness: awake Pain management: pain level controlled Vital Signs Assessment: post-procedure vital signs reviewed and stable Respiratory status: spontaneous breathing Cardiovascular status: stable Anesthetic complications: no       Last Vitals:  Vitals:   01/13/17 1200 01/13/17 1215  BP: (!) 207/76 (!) 205/70  Pulse: 65 68  Resp: 10 14  Temp: 36.4 C     Last Pain:  Vitals:   01/13/17 1200  TempSrc:   PainSc: 5                  Merril Nagy

## 2017-01-13 NOTE — Brief Op Note (Signed)
01/13/2017  11:21 AM  PATIENT:  Cassandra Dyer  76 y.o. female  PRE-OPERATIVE DIAGNOSIS:  LARGE RIGHT RENAL MASS  POST-OPERATIVE DIAGNOSIS:  LARGE RIGHT RENAL MASS  PROCEDURE:  Procedure(s): XI ROBOTIC ASSISTED LAPAROSCOPIC RADICAL NEPHRECTOMY WITH LYSIS OF ADHESION (Right)  SURGEON:  Surgeon(s) and Role:    * Alexis Frock, MD - Primary  PHYSICIAN ASSISTANT:   ASSISTANTS: Debbrah Alar PA   ANESTHESIA:   local and general  EBL:  Total I/O In: 2000 [I.V.:2000] Out: 400 [Urine:300; Blood:100]  BLOOD ADMINISTERED:none  DRAINS: foley to gravity   LOCAL MEDICATIONS USED:  MARCAINE     SPECIMEN:  Source of Specimen:  Rt kidney with adrenal and adrenal mass  DISPOSITION OF SPECIMEN:  PATHOLOGY  COUNTS:  YES  TOURNIQUET:  * No tourniquets in log *  DICTATION: .Other Dictation: Dictation Number [id number]  PLAN OF CARE: Admit to inpatient   PATIENT DISPOSITION:  PACU - hemodynamically stable.   Delay start of Pharmacological VTE agent (>24hrs) due to surgical blood loss or risk of bleeding: yes

## 2017-01-13 NOTE — Anesthesia Procedure Notes (Signed)
Procedure Name: Intubation Date/Time: 01/13/2017 9:00 AM Performed by: Lind Covert Pre-anesthesia Checklist: Patient identified, Emergency Drugs available, Suction available, Patient being monitored and Timeout performed Patient Re-evaluated:Patient Re-evaluated prior to inductionOxygen Delivery Method: Circle system utilized Preoxygenation: Pre-oxygenation with 100% oxygen Intubation Type: IV induction Laryngoscope Size: Mac and 3 Grade View: Grade I Tube type: Oral Tube size: 7.5 mm Number of attempts: 1 Airway Equipment and Method: Stylet Secured at: 21 cm Tube secured with: Tape Dental Injury: Teeth and Oropharynx as per pre-operative assessment

## 2017-01-13 NOTE — Anesthesia Preprocedure Evaluation (Addendum)
Anesthesia Evaluation  Patient identified by MRN, date of birth, ID band Patient awake    Reviewed: Allergy & Precautions, NPO status , Patient's Chart, lab work & pertinent test results  History of Anesthesia Complications (+) Emergence Delirium  Airway Mallampati: II  TM Distance: >3 FB     Dental   Pulmonary former smoker,    breath sounds clear to auscultation       Cardiovascular hypertension,  Rhythm:Regular Rate:Normal     Neuro/Psych    GI/Hepatic negative GI ROS, (+) Hepatitis -  Endo/Other  negative endocrine ROS  Renal/GU negative Renal ROS     Musculoskeletal   Abdominal   Peds  Hematology   Anesthesia Other Findings   Reproductive/Obstetrics                            Anesthesia Physical Anesthesia Plan  ASA: III  Anesthesia Plan: General   Post-op Pain Management:    Induction: Intravenous  Airway Management Planned: Oral ETT  Additional Equipment:   Intra-op Plan:   Post-operative Plan: Possible Post-op intubation/ventilation  Informed Consent: I have reviewed the patients History and Physical, chart, labs and discussed the procedure including the risks, benefits and alternatives for the proposed anesthesia with the patient or authorized representative who has indicated his/her understanding and acceptance.   Dental advisory given  Plan Discussed with: CRNA and Anesthesiologist  Anesthesia Plan Comments:         Anesthesia Quick Evaluation

## 2017-01-14 ENCOUNTER — Encounter (HOSPITAL_COMMUNITY): Payer: Self-pay | Admitting: Urology

## 2017-01-14 LAB — TYPE AND SCREEN
ABO/RH(D): O POS
Antibody Screen: NEGATIVE

## 2017-01-14 LAB — BASIC METABOLIC PANEL
ANION GAP: 7 (ref 5–15)
BUN: 7 mg/dL (ref 6–20)
CALCIUM: 8.8 mg/dL — AB (ref 8.9–10.3)
CO2: 26 mmol/L (ref 22–32)
CREATININE: 1.03 mg/dL — AB (ref 0.44–1.00)
Chloride: 107 mmol/L (ref 101–111)
GFR calc non Af Amer: 53 mL/min — ABNORMAL LOW (ref >60)
Glucose, Bld: 120 mg/dL — ABNORMAL HIGH (ref 65–99)
Potassium: 4.1 mmol/L (ref 3.5–5.1)
SODIUM: 140 mmol/L (ref 135–145)

## 2017-01-14 LAB — HEMOGLOBIN AND HEMATOCRIT, BLOOD
HEMATOCRIT: 29.5 % — AB (ref 36.0–46.0)
HEMOGLOBIN: 8.9 g/dL — AB (ref 12.0–15.0)

## 2017-01-14 NOTE — Progress Notes (Signed)
01/14/17  1830  Reviewed discharge instructions with patient. Patient verbalized understanding of discharge instructions. Copy of discharge instructions and prescription given to patient.

## 2017-01-14 NOTE — Discharge Summary (Signed)
Physician Discharge Summary  Patient ID: Cassandra Dyer MRN: [id number] DOB/AGE: 1941-08-02 76 y.o.  Admit date: 01/13/2017 Discharge date: 01/14/2017  Admission Diagnoses:  Discharge Diagnoses:  Active Problems:   Renal mass   Discharged Condition: good  Hospital Course:   1 - Right Renal + Adrenal Mass - Pt underwent RIGHT robotic radical nephrectomy (wtih total right adrenalectomy) on 01/13/17, the day of admission, without acute complication. She was admitted to 4th floor Urology service post-op where she began her vigorous recovery. By the afternoon of POD 1 she is ambulatory, tolerate PO hydration / nutrition, pain controlled with oral meds, and felt to be adequate for discharge. Discharge Hgb 8.9, Cr 1.03, final surgical pathology pending.   Consults: None  Significant Diagnostic Studies: labs: as per above.  Treatments:   RIGHT robotic radical nephrectomy (wtih total right adrenalectomy) on 01/13/17  Discharge Exam: Blood pressure (!) 153/54, pulse 80, temperature 98.1 F (36.7 C), temperature source Oral, resp. rate 18, height '5\' 2"'$  (1.575 m), weight 65.8 kg (145 lb), SpO2 97 %. General appearance: alert, cooperative, appears stated age and family at bedside Eyes: negative Nose: Nares normal. Septum midline. Mucosa normal. No drainage or sinus tenderness. Throat: lips, mucosa, and tongue normal; teeth and gums normal Neck: supple, symmetrical, trachea midline Back: symmetric, no curvature. ROM normal. No CVA tenderness. Resp: non-labored on room air.  Cardio: normal rate GI: Nl rate Extremities: extremities normal, atraumatic, no cyanosis or edema Pulses: 2+ and symmetric Skin: Skin color, texture, turgor normal. No rashes or lesions Lymph nodes: Cervical, supraclavicular, and axillary nodes normal. Neurologic: Grossly normal Incision/Wound: recent port sites / extraction sites c/d/i. NO hernias. No r/g.   Disposition: 01-Home or Self Care    Follow-up  Information    Alexis Frock, MD Follow up.   Specialty:  Urology Why:  We will call to arange post-op visit in abotu 3 weeks. Dr. Tresa Moore will call you with pathology results when available.  Contact information: [place] [address] [phone]        Mauricio Po, FNP Follow up on 02/15/2017.   Specialty:  Family Medicine Why:  Appointment at 9:00 AM. Please keep appointment. If you cancel, call 2 days before appointment or there is a charge of $50.00 for not going. Take all medications with you that you are taking.  Contact information: [place] [address] [phone]           Signed: Alexis Frock 01/14/2017, 5:21 PM

## 2017-01-14 NOTE — Op Note (Signed)
Cassandra Dyer, Cassandra Dyer NO.:  [id number]  MEDICAL RECORD NO.:  [id number]  LOCATION:                                 FACILITY:  PHYSICIAN:  Alexis Frock, MD     DATE OF BIRTH:  Nov 20, 1941  DATE OF PROCEDURE: 01/13/2017                              OPERATIVE REPORT   PREOPERATIVE DIAGNOSES:  Large right renal mass, right adrenal mass.  PROCEDURE: 1. Robotic-assisted laparoscopic right radical nephrectomy. 2. Laparoscopic extensive adhesiolysis.  ESTIMATED BLOOD LOSS:  100 mL.  COMPLICATION:  None.  SPECIMENS:  Right kidney with total adrenal and right adrenal mass en bloc.  FINDINGS: 1. Single artery, single vein, right renovascular anatomy. 2. Right adrenal nodule in the superior limb of the right adrenal     gland. 3. Extensive adhesions between the liver and gallbladder,  Lateral     abdominal wall, likely due to prior hepatitis.  This necessitated     dedicated adhesiolysis.  ASSISTANT:  Debbrah Alar, PA  INDICATION:  Cassandra Dyer is a very pleasant 76 year old lady who was found incidentally to have a large right renal mass of approximately 10 cm.  This had taken up the majority of the volume of the right kidney. There was also an ipsilateral 2 cm adrenal mass worrisome for possibly oligometastatic disease.  No bulky lymphadenopathy, chest lesions, or liver lesions noted.  She does have a history of well-controlled hepatitis.  Her renal function is normal.  Options were discussed for further management including palliative only protocols versus surgical extirpation with curative intent and she wished to proceed with the latter with right radical nephrectomy and adrenalectomy with goal of removing both the right renal mass and right adrenal mass totally. Informed consent was obtained and placed in the medical record.  PROCEDURE IN DETAIL:  The patient being Cassandra Dyer verified. Procedure being right radical nephrectomy was confirmed.  Procedure  was carried out.  Time-out was performed.  Intravenous antibiotics were administered.  General endotracheal anesthesia introduced.  The patient placed into a right side up full flank position, applying 15 degrees of stable flexion, superior arm elevator, axillary roll, sequential compression devices, bottom leg bent, top leg straight.  Foley catheter had been placed per urethra to straight drain.  She was further fashioned to the operative table using beanbag and 3-inch tape over foam padding across her supraxiphoid chest and her pelvis.  Sterile field was created by prepping and draping the patient's entire right flank using chlorhexidine gluconate and high flow low-pressure pneumoperitoneum was obtained using Veress technique in the right lower quadrant having passed the aspiration and drop test.  Next, an 8-mm robotic camera port was placed in position approximately 4 fingerbreadths superior lateral to the umbilicus.  Laparoscopic examination of the peritoneal cavity revealed very large liver with some nodular changes and extensive adhesions between the liver, abdominal wall, hepatic flexure, and gallbladder.  Some of these were quite dense.  This was felt to be likely consistent with her known hepatitis.  Additional ports were placed as follows:  Right subcostal 8-mm robotic port, right far lateral 8-mm robotic port 4 fingerbreadths superior medial to the anterior iliac spine, right paramedian inferior  robotic port approximately 1 handbreadth superior to the pubic ramus, and two 12-mm assistant port site in the midline, the inferior most being in the supraumbilical crease air seal tight and the superior most being 2 fingerbreadths above the camera port.  An additional 5-mm port was placed in the subxiphoid location for later liver retraction.  Robot was docked and passed through the electronic checks.  Initial attention was directed at adhesiolysis.  Very careful adhesiolysis was  performed for approximately 15 minutes, taking down dense adhesions between the inferior and lateral liver borders and the hepatic flexure and abdominal sidewall and chest sidewall to allow sufficient liver mobility to even get to the right retroperitoneum.  Exquisite care was taken to avoid any injury to the hepatic flexure, and this did not occur.  A self-locking grasper was then placed to the previous 5-mm subxiphoid port placing the liver on gentle superior traction.  This allowed much better exposure to the presumed area of renal and adrenal dissection.  The ascending colon was then carefully swept medially by incising just lateral to it.  Lower pole of the kidney was identified and placed on gentle lateral traction. The duodenum was encountered and very carefully kocherized medially such that it lie completely medial to the medial border of the inferior vena cava.  The ureter was identified, placed also on gentle lateral traction.  Dissection proceeded in this triangle towards the renal hilum.  Renal hilum consisted of single artery, single vein, renovascular anatomy as anticipated.  The artery was controlled using extra large clip proximally followed by vascular stapler distally.  The vein was also controlled using a separate vascular load stapler.  Given the known adrenal mass which was by preoperative imaging and at very superior medial aspect of the adrenal gland, exquisitely careful dissection was performed just lateral to the lateral border of the inferior vena cava and medial to the border of the adrenal towards the superior aspect of the adrenal on the right side.  The mass in question was seen and kept with the adrenal portion of the specimen.  Very careful dissection was performed using bipolar energy to control small expected perforating vessels to the adrenal gland from the great vessels such that the entire medial plane was developed to the apex of the adrenal.   Superior attachments were taken down using a similar technique with very careful segmental bipolar energy, small perforating vessels between the adrenal and inferior phrenic branches.  This completely mobilized the superior and medial aspects of the adrenal gland such that it and its mass were completely mobilized en bloc with the kidney. Superior and lateral renal attachments were taken down using cautery scissors.  The ureter was doubly clipped and ligated.  This completely freed up the right nephrectomy and adrenalectomy with adrenal mass and kidney mass specimen en bloc.  This was placed in extra large EndoCatch bag for retrieval.  The area of dissection was carefully inspected. There was excellent hemostasis.  The liver was also inspected very carefully.  Excellent hemostasis.  The liver retractor was taken down. Pneumoperitoneum was released.  System was retrieved by connecting the 2 previous 12-mm assistant port sites in the midline and removing the kidney, adrenal mass, and kidney mass specimen en bloc setting aside for permanent pathology.  The extraction site was closed at the level of fascia using figure-of-eight PDS x6 after mobilizing omentum such that line apposition to the closure.  Scarpa was reapproximated with running Vicryl.  All incision sites were infiltrated  with dilute lyophilized Marcaine and closed at the level of the skin using subcuticular Monocryl followed by Dermabond.  Procedure was then terminated.  The patient tolerated the procedure well.  No immediate periprocedural complications.  The patient was taken to postanesthesia care in stable condition.  Please note, the surgical assistant Debbrah Alar was crucial for all aspects of the procedure today.  She provided invaluable retraction, suctioning, stapling, mobilization of vascular structures without which the procedure today would not be possible.    ______________________________ Alexis Frock,  MD   ______________________________ Alexis Frock, MD    TM/MEDQ  D:  01/13/2017  T:  01/14/2017  Job:  [id number]

## 2017-01-14 NOTE — Progress Notes (Signed)
PCP appointment for March 5th at 9:00 am at [facility] on [address]. With [place], [place].  Pt is aware.

## 2017-02-02 DIAGNOSIS — D49511 Neoplasm of unspecified behavior of right kidney: Secondary | ICD-10-CM | POA: Diagnosis not present

## 2017-02-02 DIAGNOSIS — C641 Malignant neoplasm of right kidney, except renal pelvis: Secondary | ICD-10-CM | POA: Diagnosis not present

## 2017-02-02 DIAGNOSIS — Z905 Acquired absence of kidney: Secondary | ICD-10-CM | POA: Diagnosis not present

## 2017-02-15 ENCOUNTER — Ambulatory Visit (INDEPENDENT_AMBULATORY_CARE_PROVIDER_SITE_OTHER): Payer: Medicare Other | Admitting: Family

## 2017-02-15 ENCOUNTER — Encounter: Payer: Self-pay | Admitting: Family

## 2017-02-15 VITALS — BP 188/78 | HR 88 | Temp 98.2°F | Resp 16 | Ht 62.0 in | Wt 146.0 lb

## 2017-02-15 DIAGNOSIS — I10 Essential (primary) hypertension: Secondary | ICD-10-CM | POA: Insufficient documentation

## 2017-02-15 DIAGNOSIS — C641 Malignant neoplasm of right kidney, except renal pelvis: Secondary | ICD-10-CM

## 2017-02-15 DIAGNOSIS — E785 Hyperlipidemia, unspecified: Secondary | ICD-10-CM

## 2017-02-15 MED ORDER — AMLODIPINE BESYLATE 10 MG PO TABS: 10.0000 mg | ORAL_TABLET | Freq: Every day | ORAL | 1 refills | Status: DC

## 2017-02-15 NOTE — Patient Instructions (Addendum)
Thank you for choosing [facility].  SUMMARY AND INSTRUCTIONS:  Nice to meet you.  Please restart taking the amlodipine daily.  Continue to monitor your blood pressure at home daily and record the values for your next office visit in 1 month.  Please bring your home blood pressure cuff to your next office visit.  Information provided on the [facility] plan is below.   Please stop by the lab at your convenience for your cholesterol check.   Medication:  Your prescription(s) have been submitted to your pharmacy or been printed and provided for you. Please take as directed and contact our office if you believe you are having problem(s) with the medication(s) or have any questions.  Labs:  Please stop by the lab on the lower level of the building for your blood work. Your results will be released to [place] (or called to you) after review, usually within 72 hours after test completion. If any changes need to be made, you will be notified at that same time.  1.) The lab is open from 7:30am to 5:30 pm Monday-Friday 2.) No appointment is necessary 3.) Fasting (if needed) is 6-8 hours after food and drink; black coffee and water are okay   Follow up:  If your symptoms worsen or fail to improve, please contact our office for further instruction, or in case of emergency go directly to the emergency room at the closest medical facility.    DASH Eating Plan DASH stands for "Dietary Approaches to Stop Hypertension." The DASH eating plan is a healthy eating plan that has been shown to reduce high blood pressure (hypertension). It may also reduce your risk for type 2 diabetes, heart disease, and stroke. The DASH eating plan may also help with weight loss. What are tips for following this plan? General guidelines   Avoid eating more than 2,300 mg (milligrams) of salt (sodium) a day. If you have hypertension, you may need to reduce your sodium intake to 1,500 mg a day.  Limit alcohol intake  to no more than 1 drink a day for nonpregnant women and 2 drinks a day for men. One drink equals 12 oz of beer, 5 oz of wine, or 1 oz of hard liquor.  Work with your health care provider to maintain a healthy body weight or to lose weight. Ask what an ideal weight is for you.  Get at least 30 minutes of exercise that causes your heart to beat faster (aerobic exercise) most days of the week. Activities may include walking, swimming, or biking.  Work with your health care provider or diet and nutrition specialist (dietitian) to adjust your eating plan to your individual calorie needs. Reading food labels   Check food labels for the amount of sodium per serving. Choose foods with less than 5 percent of the Daily Value of sodium. Generally, foods with less than 300 mg of sodium per serving fit into this eating plan.  To find whole grains, look for the word "whole" as the first word in the ingredient list. Shopping   Buy products labeled as "low-sodium" or "no salt added."  Buy fresh foods. Avoid canned foods and premade or frozen meals. Cooking   Avoid adding salt when cooking. Use salt-free seasonings or herbs instead of table salt or sea salt. Check with your health care provider or pharmacist before using salt substitutes.  Do not fry foods. Cook foods using healthy methods such as baking, boiling, grilling, and broiling instead.  Cook with heart-healthy oils,  such as olive, canola, soybean, or sunflower oil. Meal planning    Eat a balanced diet that includes:  5 or more servings of fruits and vegetables each day. At each meal, try to fill half of your plate with fruits and vegetables.  Up to 6-8 servings of whole grains each day.  Less than 6 oz of lean meat, poultry, or fish each day. A 3-oz serving of meat is about the same size as a deck of cards. One egg equals 1 oz.  2 servings of low-fat dairy each day.  A serving of nuts, seeds, or beans 5 times each  week.  Heart-healthy fats. Healthy fats called Omega-3 fatty acids are found in foods such as flaxseeds and coldwater fish, like sardines, salmon, and mackerel.  Limit how much you eat of the following:  Canned or prepackaged foods.  Food that is high in trans fat, such as fried foods.  Food that is high in saturated fat, such as fatty meat.  Sweets, desserts, sugary drinks, and other foods with added sugar.  Full-fat dairy products.  Do not salt foods before eating.  Try to eat at least 2 vegetarian meals each week.  Eat more home-cooked food and less restaurant, buffet, and fast food.  When eating at a restaurant, ask that your food be prepared with less salt or no salt, if possible. What foods are recommended? The items listed may not be a complete list. Talk with your dietitian about what dietary choices are best for you. Grains  Whole-grain or whole-wheat bread. Whole-grain or whole-wheat pasta. Brown rice. [place]. Bulgur. Whole-grain and low-sodium cereals. Pita bread. Low-fat, low-sodium crackers. Whole-wheat flour tortillas. Vegetables  Fresh or frozen vegetables (raw, steamed, roasted, or grilled). Low-sodium or reduced-sodium tomato and vegetable juice. Low-sodium or reduced-sodium tomato sauce and tomato paste. Low-sodium or reduced-sodium canned vegetables. Fruits  All fresh, dried, or frozen fruit. Canned fruit in natural juice (without added sugar). Meat and other protein foods  Skinless chicken or [place]. Ground chicken or [place]. Pork with fat trimmed off. Fish and seafood. Egg whites. Dried beans, peas, or lentils. Unsalted nuts, nut butters, and seeds. Unsalted canned beans. Lean cuts of beef with fat trimmed off. Low-sodium, lean deli meat. Dairy  Low-fat (1%) or fat-free (skim) milk. Fat-free, low-fat, or reduced-fat cheeses. Nonfat, low-sodium ricotta or cottage cheese. Low-fat or nonfat yogurt. Low-fat, low-sodium cheese. Fats and oils  Soft  margarine without trans fats. Vegetable oil. Low-fat, reduced-fat, or light mayonnaise and salad dressings (reduced-sodium). Canola, safflower, olive, soybean, and sunflower oils. Avocado. Seasoning and other foods  Herbs. Spices. Seasoning mixes without salt. Unsalted popcorn and pretzels. Fat-free sweets. What foods are not recommended? The items listed may not be a complete list. Talk with your dietitian about what dietary choices are best for you. Grains  Baked goods made with fat, such as croissants, muffins, or some breads. Dry pasta or rice meal packs. Vegetables  Creamed or fried vegetables. Vegetables in a cheese sauce. Regular canned vegetables (not low-sodium or reduced-sodium). Regular canned tomato sauce and paste (not low-sodium or reduced-sodium). Regular tomato and vegetable juice (not low-sodium or reduced-sodium). Angie Fava. Olives. Fruits  Canned fruit in a light or heavy syrup. Fried fruit. Fruit in cream or butter sauce. Meat and other protein foods  Fatty cuts of meat. Ribs. Fried meat. Berniece Salines. Sausage. Bologna and other processed lunch meats. Salami. Fatback. Hotdogs. Bratwurst. Salted nuts and seeds. Canned beans with added salt. Canned or smoked fish. Whole eggs or  egg yolks. Chicken or [place] with skin. Dairy  Whole or 2% milk, cream, and half-and-half. Whole or full-fat cream cheese. Whole-fat or sweetened yogurt. Full-fat cheese. Nondairy creamers. Whipped toppings. Processed cheese and cheese spreads. Fats and oils  Butter. Stick margarine. Lard. Shortening. Ghee. Bacon fat. Tropical oils, such as coconut, palm kernel, or palm oil. Seasoning and other foods  Salted popcorn and pretzels. Onion salt, garlic salt, seasoned salt, table salt, and sea salt. Worcestershire sauce. Tartar sauce. Barbecue sauce. Teriyaki sauce. Soy sauce, including reduced-sodium. Steak sauce. Canned and packaged gravies. Fish sauce. Oyster sauce. Cocktail sauce. Horseradish that you find on the  shelf. Ketchup. Mustard. Meat flavorings and tenderizers. Bouillon cubes. Hot sauce and Tabasco sauce. Premade or packaged marinades. Premade or packaged taco seasonings. Relishes. Regular salad dressings. Where to find more information:  National Heart, Lung, and [place]: https://[place]-eaton.com/  American Heart Association: www.heart.org Summary  The DASH eating plan is a healthy eating plan that has been shown to reduce high blood pressure (hypertension). It may also reduce your risk for type 2 diabetes, heart disease, and stroke.  With the DASH eating plan, you should limit salt (sodium) intake to 2,300 mg a day. If you have hypertension, you may need to reduce your sodium intake to 1,500 mg a day.  When on the DASH eating plan, aim to eat more fresh fruits and vegetables, whole grains, lean proteins, low-fat dairy, and heart-healthy fats.  Work with your health care provider or diet and nutrition specialist (dietitian) to adjust your eating plan to your individual calorie needs. This information is not intended to replace advice given to you by your health care provider. Make sure you discuss any questions you have with your health care provider. Document Released: 11/19/2011 Document Revised: 11/23/2016 Document Reviewed: 11/23/2016 Elsevier Interactive Patient Education  2017 [employer].

## 2017-02-15 NOTE — Progress Notes (Signed)
Subjective:    Patient ID: Cassandra Dyer, female    DOB: 21-Jul-1941, 76 y.o.   MRN: [id number]  Chief Complaint  Patient presents with  . Establish Care    pts previous dr passed away, currently not on any medication since 2015, was taking BP meds and cholesterol medications    HPI:  Cassandra Dyer is a 76 y.o. female who  has a past medical history of Anemia; Hepatitis; History of blood transfusion; Hyperlipidemia; and Hypertension. and presents today for an office visit to establish care.   1.) Renal mass - recently admitted to the hospital and underwent a right robotic radical nephrectomy with total right adrenalectomy 1/44/31 without complication. She is followed by Dr. Tresa Moore of [facility]. 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 She has since followed up with urology is doing well and planned for recheck in 6 months. No current pain or urinary symptoms.  2.) Hypertension - Previously maintained on hydrochlorothiazide (25 mg), Valsartan (160 mg), and amlodipine (10 mg). Has not taken the medication is the past several months. When she takes her blood pressure at home in ranges in the 140s. Believes she may have white coat hypertension. Denies worst headache of life with no symptoms of end organ damage. Not currently following a low sodium diet. Physical activity of walking daily for about 30 minutes.   BP Readings from Last 3 Encounters:  02/15/17 (!) 188/78  01/14/17 (!) 153/54  01/12/17 (!) 160/94    3.) Dyslipidemia - Previously maintained on pravastatin (20 mg). Has not taken the medication in several months since her previous PCP passed away. Denies any adverse side effects while taking the medication. Denies chest pain, shortness of breath or paroxsymal nocturnal dyspnea.   Lab Results  Component Value Date   CHOL  04/03/2009    182        ATP III CLASSIFICATION:  <200     mg/dL   Desirable  200-239  mg/dL   Borderline High  >=240    mg/dL   High          HDL 39 (L) 04/03/2009   LDLCALC (H) 04/03/2009    133        Total Cholesterol/HDL:CHD Risk Coronary Heart Disease Risk Table                     Men   Women  1/2 Average Risk   3.4   3.3  Average Risk       5.0   4.4  2 X Average Risk   9.6   7.1  3 X Average Risk  23.4   11.0        Use the calculated Patient Ratio above and the CHD Risk Table to determine the patient's CHD Risk.        ATP III CLASSIFICATION (LDL):  <100     mg/dL   Optimal  100-129  mg/dL   Near or Above                    Optimal  130-159  mg/dL   Borderline  160-189  mg/dL   High  >190     mg/dL   Very High   TRIG 50 04/03/2009   CHOLHDL 4.7 04/03/2009     No Known Allergies    Outpatient Medications Prior to Visit  Medication Sig Dispense Refill  . HYDROcodone-acetaminophen (NORCO) 5-325 MG tablet Take 1-2 tablets  by mouth every 6 (six) hours as needed for moderate pain or severe pain. 30 tablet 0   Facility-Administered Medications Prior to Visit  Medication Dose Route Frequency Provider Last Rate Last Dose  . magnesium citrate solution 1 Bottle  1 Bottle Oral Once Alexis Frock, MD         Past Medical History:  Diagnosis Date  . Anemia   . Hepatitis    hx of hep C - 10-20 years ago   . History of blood transfusion   . Hyperlipidemia   . Hypertension    not taken b/p med in 2-3 years md deceased never went to another md       Past Surgical History:  Procedure Laterality Date  . ROBOT ASSISTED LAPAROSCOPIC NEPHRECTOMY Right 01/13/2017   Procedure: XI ROBOTIC ASSISTED LAPAROSCOPIC RADICAL NEPHRECTOMY WITH LYSIS OF ADHESION;  Surgeon: Alexis Frock, MD;  Location: [facility];  Service: Urology;  Laterality: Right;      Family History  Problem Relation Age of Onset  . Hypertension Mother   . Pancreatic cancer Father       Social History   Social History  . Marital status: Single    Spouse name: N/A  . Number of children: 0  . Years of  education: 9   Occupational History  . Retired    Social History Main Topics  . Smoking status: Former Smoker    Packs/day: 0.50    Years: 30.00    Types: Cigarettes    Quit date: 12/14/2000  . Smokeless tobacco: Never Used  . Alcohol use Yes     Comment: occasional wine   . Drug use: No  . Sexual activity: Not on file   Other Topics Concern  . Not on file   Social History Narrative   Fun/Hobby: Watching TV and crossword puzzles      Review of Systems  Constitutional: Negative for chills and fever.  Eyes:       Negative for changes in vision  Respiratory: Negative for cough, chest tightness and wheezing.   Cardiovascular: Negative for chest pain, palpitations and leg swelling.  Genitourinary: Negative for decreased urine volume, dysuria, frequency, hematuria and urgency.  Neurological: Negative for dizziness, weakness and light-headedness.       Objective:    BP (!) 188/78 (BP Location: Left Arm, Patient Position: Sitting, Cuff Size: Normal)   Pulse 88   Temp 98.2 F (36.8 C) (Oral)   Resp 16   Ht '5\' 2"'$  (1.575 m)   Wt 146 lb (66.2 kg)   SpO2 91%   BMI 26.70 kg/m  Nursing note and vital signs reviewed.  Physical Exam  Constitutional: She is oriented to person, place, and time. She appears well-developed and well-nourished. No distress.  Cardiovascular: Normal rate, regular rhythm, normal heart sounds and intact distal pulses.  Exam reveals no gallop and no friction rub.   No murmur heard. Pulmonary/Chest: Effort normal and breath sounds normal. No respiratory distress. She has no wheezes. She has no rales. She exhibits no tenderness.  Abdominal: Normal appearance. There is no CVA tenderness.  Neurological: She is alert and oriented to person, place, and time.  Skin: Skin is warm and dry.  Psychiatric: She has a normal mood and affect. Her behavior is normal. Judgment and thought content normal.        Assessment & Plan:   Problem List Items Addressed  This Visit      Cardiovascular and Mediastinum   Essential hypertension -  Primary    Blood pressure elevated above goal 140/90 with current lifestyle management and previously maintained on blood pressure medication. Denies worst headache of life with no new symptoms of end organ damage on physical exam. Information on Dash eating plan provided. Encouraged to monitor blood pressure at home and follow low-sodium diet. Start amlodipine. There is concern for possible white coat hypertension. Bring blood pressure cuff and readings to follow-up in one month to check accuracy of blood pressure cuff.      Relevant Medications   amLODipine (NORVASC) 10 MG tablet   Other Relevant Orders   Comprehensive metabolic panel     Genitourinary   Renal cell carcinoma of right kidney (HCC)    Right renal cell carcinoma status post right nephrectomy. Stable with no current urinary symptoms or costovertebral pain. Management and follow up per urology.        Other   Dyslipidemia    Obtain lipid profile. Previously maintained on pravastatin. Continue lifestyle management pending lipid profile results.      Relevant Orders   Comprehensive metabolic panel   Lipid panel       I have discontinued Ms. Marshman's HYDROcodone-acetaminophen. I am also having her start on amLODipine.   Meds ordered this encounter  Medications  . amLODipine (NORVASC) 10 MG tablet    Sig: Take 1 tablet (10 mg total) by mouth daily.    Dispense:  30 tablet    Refill:  1    Order Specific Question:   Supervising Provider    Answer:   Pricilla Holm A [[id number]]     Follow-up: Return in about 1 month (around 03/18/2017), or if symptoms worsen or fail to improve.  Mauricio Po, FNP

## 2017-02-15 NOTE — Assessment & Plan Note (Signed)
Right renal cell carcinoma status post right nephrectomy. Stable with no current urinary symptoms or costovertebral pain. Management and follow up per urology.

## 2017-02-15 NOTE — Assessment & Plan Note (Signed)
Blood pressure elevated above goal 140/90 with current lifestyle management and previously maintained on blood pressure medication. Denies worst headache of life with no new symptoms of end organ damage on physical exam. Information on Dash eating plan provided. Encouraged to monitor blood pressure at home and follow low-sodium diet. Start amlodipine. There is concern for possible white coat hypertension. Bring blood pressure cuff and readings to follow-up in one month to check accuracy of blood pressure cuff.

## 2017-02-15 NOTE — Assessment & Plan Note (Signed)
Obtain lipid profile. Previously maintained on pravastatin. Continue lifestyle management pending lipid profile results.

## 2017-02-24 ENCOUNTER — Encounter: Payer: Self-pay | Admitting: Family

## 2017-03-08 ENCOUNTER — Other Ambulatory Visit (INDEPENDENT_AMBULATORY_CARE_PROVIDER_SITE_OTHER): Payer: Medicare Other

## 2017-03-08 DIAGNOSIS — E785 Hyperlipidemia, unspecified: Secondary | ICD-10-CM

## 2017-03-08 DIAGNOSIS — I10 Essential (primary) hypertension: Secondary | ICD-10-CM

## 2017-03-08 LAB — COMPREHENSIVE METABOLIC PANEL
ALT: 26 U/L (ref 0–35)
AST: 29 U/L (ref 0–37)
Albumin: 4.3 g/dL (ref 3.5–5.2)
Alkaline Phosphatase: 66 U/L (ref 39–117)
BUN: 12 mg/dL (ref 6–23)
CO2: 27 mEq/L (ref 19–32)
CREATININE: 0.96 mg/dL (ref 0.40–1.20)
Calcium: 9.7 mg/dL (ref 8.4–10.5)
Chloride: 108 mEq/L (ref 96–112)
GFR: 73.1 mL/min (ref >60.00)
GLUCOSE: 110 mg/dL — AB (ref 70–99)
Potassium: 4.7 mEq/L (ref 3.5–5.1)
Sodium: 140 mEq/L (ref 135–145)
Total Bilirubin: 0.3 mg/dL (ref 0.2–1.2)
Total Protein: 7.2 g/dL (ref 6.0–8.3)

## 2017-03-08 LAB — LIPID PANEL
CHOL/HDL RATIO: 4
Cholesterol: 160 mg/dL (ref 0–200)
HDL: 45.5 mg/dL (ref >39.00)
LDL CALC: 101 mg/dL — AB (ref 0–99)
NONHDL: 114.26
Triglycerides: 68 mg/dL (ref 0.0–149.0)
VLDL: 13.6 mg/dL (ref 0.0–40.0)

## 2017-03-25 ENCOUNTER — Encounter: Payer: Self-pay | Admitting: Family

## 2017-03-25 ENCOUNTER — Ambulatory Visit (INDEPENDENT_AMBULATORY_CARE_PROVIDER_SITE_OTHER): Payer: Medicare Other | Admitting: Family

## 2017-03-25 DIAGNOSIS — I10 Essential (primary) hypertension: Secondary | ICD-10-CM | POA: Diagnosis not present

## 2017-03-25 MED ORDER — AMLODIPINE BESYLATE 10 MG PO TABS: 10.0000 mg | ORAL_TABLET | Freq: Every day | ORAL | 1 refills | Status: DC

## 2017-03-25 MED ORDER — METOPROLOL SUCCINATE ER 50 MG PO TB24: 50.0000 mg | ORAL_TABLET | Freq: Every day | ORAL | 1 refills | Status: DC

## 2017-03-25 NOTE — Patient Instructions (Signed)
Thank you for choosing [facility].  SUMMARY AND INSTRUCTIONS:   Please continue to take her medications as prescribed.  Start metoprolol daily.  Continue to monitor your blood pressures at home as you have been doing.  Everything is moving in the right direction.  Follow-up in 3 weeks for nurse visit to check blood pressure.  Medication:  Your prescription(s) have been submitted to your pharmacy or been printed and provided for you. Please take as directed and contact our office if you believe you are having problem(s) with the medication(s) or have any questions.  Follow up:  If your symptoms worsen or fail to improve, please contact our office for further instruction, or in case of emergency go directly to the emergency room at the closest medical facility.

## 2017-03-25 NOTE — Assessment & Plan Note (Signed)
Blood pressure remains elevated above goal 150/90 with current medication regimen and no adverse side effects. Continue current dosage of amlodipine. Start metoprolol. Encouraged to monitor blood pressure at home and follow low-sodium diet. Denies worst headache of life with no new symptoms of end organ damage noted on physical exam. Continue to monitor.

## 2017-03-25 NOTE — Progress Notes (Signed)
Subjective:    Patient ID: Cassandra Dyer, female    DOB: [birth year], 76 y.o.   MRN: [id number]  Chief Complaint  Patient presents with  . Follow-up    blood pressure    HPI:  Cassandra Dyer is a 76 y.o. female who  has a past medical history of Anemia; Hepatitis; History of blood transfusion; Hyperlipidemia; and Hypertension. and presents today for a follow up office visit.   1.) Hypertension - Currently maintained on amlodipine. Reports taking the medication as prescribed and denies adverse side effects. Denies worst headache of life with no symptoms. Blood pressures at home have been ranging between 140-160. Working on following a low sodium diet.   BP Readings from Last 3 Encounters:  03/25/17 (!) 164/78  02/15/17 (!) 188/78  01/14/17 (!) 153/54    No Known Allergies    Outpatient Medications Prior to Visit  Medication Sig Dispense Refill  . amLODipine (NORVASC) 10 MG tablet Take 1 tablet (10 mg total) by mouth daily. 30 tablet 1   Facility-Administered Medications Prior to Visit  Medication Dose Route Frequency Provider Last Rate Last Dose  . magnesium citrate solution 1 Bottle  1 Bottle Oral Once Alexis Frock, MD          Review of Systems  Constitutional: Negative for chills and fever.  Eyes:       Negative for changes in vision  Respiratory: Negative for cough, chest tightness and wheezing.   Cardiovascular: Negative for chest pain, palpitations and leg swelling.  Neurological: Negative for dizziness, weakness and light-headedness.      Objective:    BP (!) 164/78 (BP Location: Left Arm, Patient Position: Sitting, Cuff Size: Large)   Pulse 97   Temp 98.7 F (37.1 C) (Oral)   Resp 16   Ht '5\' 2"'$  (1.575 m)   Wt 148 lb 12.8 oz (67.5 kg)   SpO2 95%   BMI 27.22 kg/m  Nursing note and vital signs reviewed.  Physical Exam  Constitutional: She is oriented to person, place, and time. She appears well-developed and well-nourished. No distress.    Cardiovascular: Normal rate, regular rhythm, normal heart sounds and intact distal pulses.   Pulmonary/Chest: Effort normal and breath sounds normal.  Neurological: She is alert and oriented to person, place, and time.  Skin: Skin is warm and dry.  Psychiatric: She has a normal mood and affect. Her behavior is normal. Judgment and thought content normal.       Assessment & Plan:   Problem List Items Addressed This Visit      Cardiovascular and Mediastinum   Essential hypertension    Blood pressure remains elevated above goal 150/90 with current medication regimen and no adverse side effects. Continue current dosage of amlodipine. Start metoprolol. Encouraged to monitor blood pressure at home and follow low-sodium diet. Denies worst headache of life with no new symptoms of end organ damage noted on physical exam. Continue to monitor.      Relevant Medications   metoprolol succinate (TOPROL-XL) 50 MG 24 hr tablet   amLODipine (NORVASC) 10 MG tablet       I am having Cassandra Dyer start on metoprolol succinate. I am also having her maintain her Garlic and amLODipine.   Meds ordered this encounter  Medications  . Garlic 2130 MG CAPS    Sig: Take by mouth.  . metoprolol succinate (TOPROL-XL) 50 MG 24 hr tablet    Sig: Take 1 tablet (50 mg total) by mouth daily.  Take with or immediately following a meal.    Dispense:  30 tablet    Refill:  1    Order Specific Question:   Supervising Provider    Answer:   Pricilla Holm A [[id number]]  . amLODipine (NORVASC) 10 MG tablet    Sig: Take 1 tablet (10 mg total) by mouth daily.    Dispense:  90 tablet    Refill:  1    Order Specific Question:   Supervising Provider    Answer:   Pricilla Holm A [[id number]]     Follow-up: Return in about 3 weeks (around 04/15/2017), or if symptoms worsen or fail to improve.  Mauricio Po, FNP

## 2017-04-15 ENCOUNTER — Ambulatory Visit: Payer: Medicare Other | Admitting: General Practice

## 2017-04-15 VITALS — BP 150/60

## 2017-04-15 DIAGNOSIS — I10 Essential (primary) hypertension: Secondary | ICD-10-CM

## 2017-04-15 NOTE — Progress Notes (Signed)
Blood pressure improved. Continue current dosage of metoprolol and amlodipine. Follow up in 3 months.

## 2017-05-16 ENCOUNTER — Other Ambulatory Visit: Payer: Self-pay | Admitting: Family

## 2017-07-20 ENCOUNTER — Other Ambulatory Visit: Payer: Self-pay | Admitting: Family

## 2017-08-02 ENCOUNTER — Other Ambulatory Visit: Payer: Self-pay | Admitting: Urology

## 2017-08-02 ENCOUNTER — Ambulatory Visit (HOSPITAL_COMMUNITY)
Admission: RE | Admit: 2017-08-02 | Discharge: 2017-08-02 | Disposition: A | Discharge status: Home / Self Care | Payer: Medicare Other | Source: Ambulatory Visit | Attending: Urology | Admitting: Urology

## 2017-08-02 DIAGNOSIS — C641 Malignant neoplasm of right kidney, except renal pelvis: Secondary | ICD-10-CM

## 2017-08-02 DIAGNOSIS — R918 Other nonspecific abnormal finding of lung field: Secondary | ICD-10-CM | POA: Diagnosis not present

## 2017-08-05 DIAGNOSIS — Z85528 Personal history of other malignant neoplasm of kidney: Secondary | ICD-10-CM | POA: Diagnosis not present

## 2017-08-05 DIAGNOSIS — C641 Malignant neoplasm of right kidney, except renal pelvis: Secondary | ICD-10-CM | POA: Diagnosis not present

## 2017-08-05 DIAGNOSIS — C649 Malignant neoplasm of unspecified kidney, except renal pelvis: Secondary | ICD-10-CM | POA: Diagnosis not present

## 2017-10-26 ENCOUNTER — Ambulatory Visit (INDEPENDENT_AMBULATORY_CARE_PROVIDER_SITE_OTHER): Payer: Medicare Other | Admitting: Nurse Practitioner

## 2017-10-26 ENCOUNTER — Encounter: Payer: Self-pay | Admitting: Nurse Practitioner

## 2017-10-26 DIAGNOSIS — I10 Essential (primary) hypertension: Secondary | ICD-10-CM

## 2017-10-26 MED ORDER — AMLODIPINE BESYLATE 5 MG PO TABS: 5.0000 mg | ORAL_TABLET | Freq: Every day | ORAL | 2 refills | Status: DC

## 2017-10-26 NOTE — Assessment & Plan Note (Signed)
Blood pressure above goal 150/90. She ran out of medications one month ago. Shes been getting daily readings 140s-150s/60s-70s over the past month since being off her medications. Denies headaches, vision changes, chest pain, shortness of breath.  Will restart amlodipine at 5mg  po daily. She will keep BP log at home and return in 1 month for Follow up.

## 2017-10-26 NOTE — Progress Notes (Signed)
Subjective:    Patient ID: Cassandra Dyer, female    DOB: Jan 03, 1941, 76 y.o.   MRN: [id number]  HPI Mr Cassandra Dyer is a 77 yo female who presents today to establish care. She is transferring to me from another provider in the same clinic.  Hypertension-Has been maintained on amlodipine and toprol. She ran out of her medications about 1 month ago and was unable to get an appointment for refill. She is wanting to get back on her BP medications. She checks her blood pressure every night and reports readings around 140s-150s/60s-70s. She denies headaches, vision changes, chest pain, shortness. She reports she tolerated her medications well and did not have any adverse reactions to the medications in the past.  Review of Systems  See HPI  Past Medical History:  Diagnosis Date  . Anemia   . Hepatitis    hx of hep C - 10-20 years ago   . History of blood transfusion   . Hyperlipidemia   . Hypertension    not taken b/p med in 2-3 years md deceased never went to another md      Social History   Socioeconomic History  . Marital status: Single    Spouse name: Not on file  . Number of children: 0  . Years of education: 9  . Highest education level: Not on file  Social Needs  . Financial resource strain: Not on file  . Food insecurity - worry: Not on file  . Food insecurity - inability: Not on file  . Transportation needs - medical: Not on file  . Transportation needs - non-medical: Not on file  Occupational History  . Occupation: Retired  Tobacco Use  . Smoking status: Former Smoker    Packs/day: 0.50    Years: 30.00    Pack years: 15.00    Types: Cigarettes    Last attempt to quit: 12/14/2000    Years since quitting: 16.8  . Smokeless tobacco: Never Used  Substance and Sexual Activity  . Alcohol use: Yes    Comment: occasional wine   . Drug use: No  . Sexual activity: Not on file  Other Topics Concern  . Not on file  Social History Narrative   Fun/Hobby: Watching TV and  crossword puzzles    No past surgical history on file.  Family History  Problem Relation Age of Onset  . Hypertension Mother   . Pancreatic cancer Father     No Known Allergies  Current Outpatient Medications on File Prior to Visit  Medication Sig Dispense Refill  . amLODipine (NORVASC) 10 MG tablet Take 1 tablet (10 mg total) by mouth daily. 90 tablet 1  . Ascorbic Acid (VITAMIN C PO) Take by mouth.    . Cholecalciferol (VITAMIN D PO) Take by mouth.    . Garlic 9563 MG CAPS Take by mouth.    . metoprolol succinate (TOPROL-XL) 50 MG 24 hr tablet TAKE 1 TABLET BY MOUTH ONCE DAILY. TAKE  WITH  OR  IMMEDIATELY  FOLLOWING  A  MEAL 30 tablet 1  . NON FORMULARY Mega Juice, Female balance vitamins     Current Facility-Administered Medications on File Prior to Visit  Medication Dose Route Frequency Provider Last Rate Last Dose  . magnesium citrate solution 1 Bottle  1 Bottle Oral Once Alexis Frock, MD        BP (!) 160/70 (BP Location: Left Arm, Patient Position: Sitting, Cuff Size: Large)   Pulse 88   Temp 98.3 F (  36.8 C) (Oral)   Resp 16   Ht 5\' 2"  (1.575 m)   Wt 150 lb (68 kg)   SpO2 98%   BMI 27.44 kg/m       Objective:   Physical Exam  Constitutional: She is oriented to person, place, and time. She appears well-developed and well-nourished. No distress.  HENT:  Head: Normocephalic and atraumatic.  Cardiovascular: Normal rate, regular rhythm and intact distal pulses.  Pulmonary/Chest: Effort normal and breath sounds normal.  Musculoskeletal: She exhibits no edema.  Neurological: She is alert and oriented to person, place, and time. Coordination normal.  Skin: Skin is warm and dry.  Psychiatric: She has a normal mood and affect. Judgment and thought content normal.      Assessment & Plan:

## 2017-10-26 NOTE — Patient Instructions (Addendum)
I have sent a refill of your amlodipine 5mg  once daily to your pharmacy.  Please try to check your blood pressure once daily or at least a few times a week, at the same time each day, and keep a log.  Return in 1 month and we will see how your blood pressure is doing with the amlodipine 5mg  once daily.  Our blood pressure goal for you is less than 150/90.  If you have medicare related insurance (such as traditional Medicare, [employer], [employer], or similar), Please make an appointment at the scheduling desk with Sharee Pimple, the [employer], for your Wellness visit in this office, which is a benefit with your insurance.  It was nice to meet you. Thanks for letting me take care of you today :)

## 2017-11-10 NOTE — Progress Notes (Signed)
Subjective:   Cassandra Dyer is a 76 y.o. female who presents for an Initial Medicare Annual Wellness Visit.  Review of Systems    No ROS.  Medicare Wellness Visit. Additional risk factors are reflected in the social history.  Cardiac Risk Factors include: advanced age (>17men, >69 women);dyslipidemia;hypertension Sleep patterns: feels rested on waking, gets up 1 times nightly to void and sleeps 7-8 hours nightly.    Home Safety/Smoke Alarms: Feels safe in home. Smoke alarms in place.  Living environment; residence and Firearm Safety: 1-story house/ trailer, no firearms. Lives with alone, no needs for DME, good support system Seat Belt Safety/Bike Helmet: Wears seat belt.     Objective:    Today's Vitals   11/11/17 1458 11/11/17 1535  BP: (!) 162/72 (!) 164/65  Pulse: 96 86  Resp: 20   SpO2: 98%   Weight: 154 lb (69.9 kg)   Height: 5\' 2"  (1.575 m)    Body mass index is 28.17 kg/m.   Current Medications (verified) Outpatient Encounter Medications as of 11/11/2017  Medication Sig  . amLODipine (NORVASC) 5 MG tablet Take 1 tablet (5 mg total) daily by mouth.  . Ascorbic Acid (VITAMIN C PO) Take by mouth.  . Cholecalciferol (VITAMIN D PO) Take by mouth.  . Garlic 1540 MG CAPS Take by mouth.  . metoprolol succinate (TOPROL-XL) 50 MG 24 hr tablet TAKE 1 TABLET BY MOUTH ONCE DAILY. TAKE  WITH  OR  IMMEDIATELY  FOLLOWING  A  MEAL  . NON FORMULARY Mega Juice, Female balance vitamins   Facility-Administered Encounter Medications as of 11/11/2017  Medication  . magnesium citrate solution 1 Bottle    Allergies (verified) Patient has no known allergies.   History: Past Medical History:  Diagnosis Date  . Anemia   . Hepatitis    hx of hep C - 10-20 years ago   . History of blood transfusion   . Hyperlipidemia   . Hypertension    not taken b/p med in 2-3 years md deceased never went to another md    Past Surgical History:  Procedure Laterality Date  . ROBOT ASSISTED  LAPAROSCOPIC NEPHRECTOMY Right 01/13/2017   Procedure: XI ROBOTIC ASSISTED LAPAROSCOPIC RADICAL NEPHRECTOMY WITH LYSIS OF ADHESION;  Surgeon: Alexis Frock, MD;  Location: [facility];  Service: Urology;  Laterality: Right;   Family History  Problem Relation Age of Onset  . Hypertension Mother   . Pancreatic cancer Father    Social History   Occupational History  . Occupation: Retired  Tobacco Use  . Smoking status: Former Smoker    Packs/day: 0.50    Years: 30.00    Pack years: 15.00    Types: Cigarettes    Last attempt to quit: 12/14/2000    Years since quitting: 16.9  . Smokeless tobacco: Never Used  Substance and Sexual Activity  . Alcohol use: Yes    Comment: occasional Kaynen Minner   . Drug use: No  . Sexual activity: Not on file    Tobacco Counseling Counseling given: Not Answered   Activities of Daily Living In your present state of health, do you have any difficulty performing the following activities: 11/11/2017 01/13/2017  Hearing? N -  Vision? N -  Comment - -  Difficulty concentrating or making decisions? N N  Walking or climbing stairs? N N  Dressing or bathing? N N  Doing errands, shopping? N N  Preparing Food and eating ? N -  Using the Toilet? N -  In  the past six months, have you accidently leaked urine? N -  Do you have problems with loss of bowel control? N -  Managing your Medications? N -  Managing your Finances? N -  Housekeeping or managing your Housekeeping? N -  Some recent data might be hidden    Immunizations and Health Maintenance Immunization History  Administered Date(s) Administered  . Tdap 05/16/2015   Health Maintenance Due  Topic Date Due  . DEXA SCAN  05/06/2008  . MAMMOGRAM  02/17/2017    Patient Care Team: Golden Circle, FNP as PCP - General (Family Medicine)  Indicate any recent Medical Services you may have received from other than [facility] providers in the past year (date may be approximate).     Assessment:   This is a  routine wellness examination for Aliyah.Physical assessment deferred to PCP.   Hearing/Vision screen  Visual Acuity Screening   Right eye Left eye Both eyes  Without correction: 20/40 20/40 20/40   With correction:     Comments: Patient states she will make an eye appointment.  Hearing Screening Comments: Able to hear conversational tones w/o difficulty. No issues reported.  Passed whisper test  Dietary issues and exercise activities discussed: Current Exercise Habits: Home exercise routine(Patient has an active job of which she works 5 days a week.), Type of exercise: walking, Time (Minutes): 30, Frequency (Times/Week): 5, Weekly Exercise (Minutes/Week): 150, Intensity: Mild, Exercise limited by: None identified  Diet (meal preparation, eat out, water intake, caffeinated beverages, dairy products, fruits and vegetables): in general, a "healthy" diet  , well balanced, eats a variety of fruits and vegetables daily, limits salt, fat/cholesterol, sugar, caffeine, drinks 6-8 glasses of water daily.  Goals    . Patient Stated     Maintain current health status, continue to reach to individuals that need help and stay active in church, enjoy life and family      Depression Screen PHQ 2/9 Scores 11/11/2017 02/15/2017  PHQ - 2 Score 0 0  PHQ- 9 Score 0 -    Fall Risk Fall Risk  11/11/2017 02/15/2017  Falls in the past year? No No    Cognitive Function: MMSE - Mini Mental State Exam 11/11/2017  Orientation to time 5  Orientation to Place 5  Registration 3  Attention/ Calculation 5  Recall 2  Language- name 2 objects 2  Language- repeat 1  Language- follow 3 step command 3  Language- read & follow direction 1  Write a sentence 1  Copy design 1  Total score 29        Screening Tests Health Maintenance  Topic Date Due  . DEXA SCAN  05/06/2008  . MAMMOGRAM  02/17/2017  . INFLUENZA VACCINE  03/21/2018 (Originally 07/14/2017)  . PNA vac Low Risk Adult (1 of 2 - PCV13) 11/11/2018  (Originally 05/06/2008)  . COLONOSCOPY  10/08/2019  . TETANUS/TDAP  05/15/2025      Plan:    Continue doing brain stimulating activities (puzzles, reading, adult coloring books, staying active) to keep memory sharp.   Continue to eat heart healthy diet (full of fruits, vegetables, whole grains, lean protein, water--limit salt, fat, and sugar intake) and increase physical activity as tolerated.  I have personally reviewed and noted the following in the patient's chart:   . Medical and social history . Use of alcohol, tobacco or illicit drugs  . Current medications and supplements . Functional ability and status . Nutritional status . Physical activity . Advanced directives . List of  other physicians . Vitals . Screenings to include cognitive, depression, and falls . Referrals and appointments  In addition, I have reviewed and discussed with patient certain preventive protocols, quality metrics, and best practice recommendations. A written personalized care plan for preventive services as well as general preventive health recommendations were provided to patient.     Michiel Cowboy, RN   11/11/2017

## 2017-11-11 ENCOUNTER — Ambulatory Visit (INDEPENDENT_AMBULATORY_CARE_PROVIDER_SITE_OTHER): Payer: Medicare Other | Admitting: *Deleted

## 2017-11-11 ENCOUNTER — Telehealth: Payer: Self-pay | Admitting: *Deleted

## 2017-11-11 VITALS — BP 164/65 | HR 86 | Resp 20 | Ht 62.0 in | Wt 154.0 lb

## 2017-11-11 DIAGNOSIS — Z0000 Encounter for general adult medical examination without abnormal findings: Secondary | ICD-10-CM

## 2017-11-11 DIAGNOSIS — Z1231 Encounter for screening mammogram for malignant neoplasm of breast: Secondary | ICD-10-CM | POA: Diagnosis not present

## 2017-11-11 DIAGNOSIS — Z780 Asymptomatic menopausal state: Secondary | ICD-10-CM | POA: Diagnosis not present

## 2017-11-11 DIAGNOSIS — Z1239 Encounter for other screening for malignant neoplasm of breast: Secondary | ICD-10-CM

## 2017-11-11 NOTE — Telephone Encounter (Signed)
During AWV, patient's B/P was elevated 162/72 pulse 96 and 30 minutes later 164/65 pulse 86. Patient stated that her norvasc was lowered to 5 mg. Patient has a follow-up visit scheduled for 12/02/17 for B/P check.

## 2017-11-11 NOTE — Patient Instructions (Signed)
Continue doing brain stimulating activities (puzzles, reading, adult coloring books, staying active) to keep memory sharp.   Continue to eat heart healthy diet (full of fruits, vegetables, whole grains, lean protein, water--limit salt, fat, and sugar intake) and increase physical activity as tolerated.   Cassandra Dyer , Thank you for taking time to come for your Medicare Wellness Visit. I appreciate your ongoing commitment to your health goals. Please review the following plan we discussed and let me know if I can assist you in the future.   These are the goals we discussed: Goals    . Patient Stated     Maintain current health status, continue to reach to individuals that need help and stay active in church, enjoy life and family       This is a list of the screening recommended for you and due dates:  Health Maintenance  Topic Date Due  . DEXA scan (bone density measurement)  05/06/2008  . Mammogram  02/17/2017  . Flu Shot  03/21/2018*  . Pneumonia vaccines (1 of 2 - PCV13) 11/11/2018*  . Colon Cancer Screening  10/08/2019  . Tetanus Vaccine  05/15/2025  *Topic was postponed. The date shown is not the original due date.

## 2017-11-12 NOTE — Telephone Encounter (Signed)
Left message advising patient of ashleighs note/instructions---call back if you need refill

## 2017-11-15 NOTE — Progress Notes (Signed)
Medical screening examination/treatment/procedure(s) were performed by the Wellness Coach, RN. As primary care provider I was immediately available for consulation/collaboration. I agree with above documentation. Micholas Drumwright, NP  

## 2017-12-02 ENCOUNTER — Encounter: Payer: Self-pay | Admitting: Nurse Practitioner

## 2017-12-02 ENCOUNTER — Ambulatory Visit (INDEPENDENT_AMBULATORY_CARE_PROVIDER_SITE_OTHER): Payer: Medicare Other | Admitting: Nurse Practitioner

## 2017-12-02 DIAGNOSIS — I10 Essential (primary) hypertension: Secondary | ICD-10-CM | POA: Diagnosis not present

## 2017-12-02 MED ORDER — METOPROLOL SUCCINATE ER 50 MG PO TB24: ORAL_TABLET | ORAL | 1 refills | Status: DC

## 2017-12-02 MED ORDER — AMLODIPINE BESYLATE 10 MG PO TABS: 5.0000 mg | ORAL_TABLET | Freq: Every day | ORAL | 1 refills | Status: DC

## 2017-12-02 NOTE — Progress Notes (Signed)
Subjective:    Patient ID: Cassandra Dyer, female    DOB: 06-30-1941, 76 y.o.   MRN: [id number]  HPI Cassandra Dyer is a 76 yo female who presents today for a follow up of hypertension. She was seen by me on 11/13 to establish care and her blood pressure was elevated. She had been out of her daily blood pressure medications, amlodipine 5 and toprol 50 once daily, for about 1 month because she was not able to get refills due to loss of provider. She reported home readings 140s-150s/60s-70s on 11/13, so the decision was made to restart her amlodipine 5 once dialy and return in 1 month for follow up. In the meantime, She was seen for an RN wellness visit in the clinic on 11/29 and her blood pressure was 164/65, so her amlodipine dosage was increased to 10mg  daily. She returns today for follow up.  She has been taking amlodipine 10 daily with full compliance. She reports feeling well. She denies headaches, vision changes, chest pain, shortness of breath. Her blood pressure is still elevated today.  BP Readings from Last 3 Encounters:  12/02/17 (!) 170/68  11/11/17 (!) 164/65  10/26/17 (!) 160/70    Review of Systems  See HPI  Past Medical History:  Diagnosis Date  . Anemia   . Hepatitis    hx of hep C - 10-20 years ago   . History of blood transfusion   . Hyperlipidemia   . Hypertension    not taken b/p med in 2-3 years md deceased never went to another md      Social History   Socioeconomic History  . Marital status: Single    Spouse name: Not on file  . Number of children: 0  . Years of education: 9  . Highest education level: Not on file  Social Needs  . Financial resource strain: Not hard at all  . Food insecurity - worry: Never true  . Food insecurity - inability: Never true  . Transportation needs - medical: No  . Transportation needs - non-medical: No  Occupational History  . Occupation: Retired  Tobacco Use  . Smoking status: Former Smoker    Packs/day: 0.50   Years: 30.00    Pack years: 15.00    Types: Cigarettes    Last attempt to quit: 12/14/2000    Years since quitting: 16.9  . Smokeless tobacco: Never Used  Substance and Sexual Activity  . Alcohol use: Yes    Comment: occasional wine   . Drug use: No  . Sexual activity: Not on file  Other Topics Concern  . Not on file  Social History Narrative   Fun/Hobby: Watching TV and crossword puzzles    Past Surgical History:  Procedure Laterality Date  . ROBOT ASSISTED LAPAROSCOPIC NEPHRECTOMY Right 01/13/2017   Procedure: XI ROBOTIC ASSISTED LAPAROSCOPIC RADICAL NEPHRECTOMY WITH LYSIS OF ADHESION;  Surgeon: Alexis Frock, MD;  Location: [facility];  Service: Urology;  Laterality: Right;    Family History  Problem Relation Age of Onset  . Hypertension Mother   . Pancreatic cancer Father     No Known Allergies  Current Outpatient Medications on File Prior to Visit  Medication Sig Dispense Refill  . amLODipine (NORVASC) 5 MG tablet Take 1 tablet (5 mg total) daily by mouth. 90 tablet 2  . Ascorbic Acid (VITAMIN C PO) Take by mouth.    . Cholecalciferol (VITAMIN D PO) Take by mouth.    . Garlic 1601 MG CAPS  Take by mouth.    . metoprolol succinate (TOPROL-XL) 50 MG 24 hr tablet TAKE 1 TABLET BY MOUTH ONCE DAILY. TAKE  WITH  OR  IMMEDIATELY  FOLLOWING  A  MEAL 30 tablet 1  . NON FORMULARY Mega Juice, Female balance vitamins     Current Facility-Administered Medications on File Prior to Visit  Medication Dose Route Frequency Provider Last Rate Last Dose  . magnesium citrate solution 1 Bottle  1 Bottle Oral Once Alexis Frock, MD        BP (!) 170/68 (BP Location: Left Arm, Patient Position: Sitting, Cuff Size: Large)   Pulse 85   Temp 98.2 F (36.8 C) (Oral)   Resp 16   Ht 5\' 2"  (1.575 m)   Wt 153 lb (69.4 kg)   SpO2 97%   BMI 27.98 kg/m       Objective:   Physical Exam  Constitutional: She is oriented to person, place, and time. She appears well-developed and well-nourished.  No distress.  HENT:  Head: Normocephalic and atraumatic.  Cardiovascular: Normal rate, regular rhythm, normal heart sounds and intact distal pulses.  Pulmonary/Chest: Effort normal and breath sounds normal.  Musculoskeletal: She exhibits no edema.  Neurological: She is alert and oriented to person, place, and time. Coordination normal.  Skin: Skin is warm and dry.  Psychiatric: She has a normal mood and affect. Judgment and thought content normal.      Assessment & Plan:

## 2017-12-02 NOTE — Patient Instructions (Addendum)
Please continue amlodipine 10mg  once daily. Please resume taking your toprol XL 50mg  once daily.  Please try to check your blood pressure once daily or at least a few times a week, at the same time each day, and keep a log.  Let me know if you get blood pressure readings higher than 150/90, lower than 110/70.  It was good to see you. Happy holidays!

## 2017-12-05 NOTE — Assessment & Plan Note (Signed)
Continues to have high readings on amlodipine alone. We will add the toprol 50 back as she tolerated the medication well and reports her blood pressure was better controlled on the toprol and amlodipine combined. Encouraged healthy diet and exercise, continued BP monitoring at home. Medications ordered: - amLODipine (NORVASC) 10 MG tablet; Take 0.5 tablets (5 mg total) by mouth daily.  Dispense: 30 tablet; Refill: 1 - metoprolol succinate (TOPROL-XL) 50 MG 24 hr tablet; TAKE 1 TABLET BY MOUTH ONCE DAILY. TAKE  WITH  OR  IMMEDIATELY  FOLLOWING  A  MEAL  Dispense: 30 tablet; Refill: 1 RTC in 1 month for f/u

## 2017-12-30 ENCOUNTER — Ambulatory Visit
Admission: RE | Admit: 2017-12-30 | Discharge: 2017-12-30 | Disposition: A | Discharge status: Home / Self Care | Payer: Medicare Other | Source: Ambulatory Visit | Attending: Nurse Practitioner | Admitting: Nurse Practitioner

## 2017-12-30 DIAGNOSIS — Z780 Asymptomatic menopausal state: Secondary | ICD-10-CM | POA: Diagnosis not present

## 2017-12-30 DIAGNOSIS — Z1231 Encounter for screening mammogram for malignant neoplasm of breast: Secondary | ICD-10-CM | POA: Diagnosis not present

## 2017-12-30 DIAGNOSIS — M810 Age-related osteoporosis without current pathological fracture: Secondary | ICD-10-CM | POA: Diagnosis not present

## 2017-12-30 DIAGNOSIS — Z1239 Encounter for other screening for malignant neoplasm of breast: Secondary | ICD-10-CM

## 2018-01-06 ENCOUNTER — Telehealth: Payer: Self-pay

## 2018-01-06 NOTE — Telephone Encounter (Signed)
Patient would like a phone call at your convenience to discuss prolia injections. [name] recommends with her osteoporosis that she look into getting these injections. View recent dexa scan in chart with note. Thank you!

## 2018-01-07 NOTE — Telephone Encounter (Signed)
I will verify insurance for prolia and call patient back to discuss cost and any other questions about prolia

## 2018-01-20 ENCOUNTER — Ambulatory Visit (INDEPENDENT_AMBULATORY_CARE_PROVIDER_SITE_OTHER): Payer: Medicare Other | Admitting: Nurse Practitioner

## 2018-01-20 ENCOUNTER — Other Ambulatory Visit (INDEPENDENT_AMBULATORY_CARE_PROVIDER_SITE_OTHER): Payer: Medicare Other

## 2018-01-20 ENCOUNTER — Encounter: Payer: Self-pay | Admitting: Nurse Practitioner

## 2018-01-20 VITALS — BP 164/74 | HR 85 | Temp 98.9°F | Resp 16 | Ht 62.0 in | Wt 153.4 lb

## 2018-01-20 DIAGNOSIS — I10 Essential (primary) hypertension: Secondary | ICD-10-CM

## 2018-01-20 LAB — CBC WITH DIFFERENTIAL/PLATELET
Basophils Absolute: 0 10*3/uL (ref 0.0–0.1)
Basophils Relative: 0.6 % (ref 0.0–3.0)
EOS PCT: 5 % (ref 0.0–5.0)
Eosinophils Absolute: 0.3 10*3/uL (ref 0.0–0.7)
HEMATOCRIT: 37.2 % (ref 36.0–46.0)
HEMOGLOBIN: 11.9 g/dL — AB (ref 12.0–15.0)
LYMPHS PCT: 16 % (ref 12.0–46.0)
Lymphs Abs: 1 10*3/uL (ref 0.7–4.0)
MCHC: 32 g/dL (ref 30.0–36.0)
MCV: 76.2 fl — AB (ref 78.0–100.0)
Monocytes Absolute: 1.2 10*3/uL — ABNORMAL HIGH (ref 0.1–1.0)
NEUTROS ABS: 3.8 10*3/uL (ref 1.4–7.7)
Neutrophils Relative %: 60 % (ref 43.0–77.0)
Platelets: 289 10*3/uL (ref 150.0–400.0)
RBC: 4.88 Mil/uL (ref 3.87–5.11)
RDW: 18.5 % — AB (ref 11.5–15.5)
WBC: 6.3 10*3/uL (ref 4.0–10.5)

## 2018-01-20 LAB — COMPREHENSIVE METABOLIC PANEL
ALBUMIN: 4.3 g/dL (ref 3.5–5.2)
ALT: 28 U/L (ref 0–35)
AST: 37 U/L (ref 0–37)
Alkaline Phosphatase: 65 U/L (ref 39–117)
BUN: 19 mg/dL (ref 6–23)
CALCIUM: 10.1 mg/dL (ref 8.4–10.5)
CHLORIDE: 105 meq/L (ref 96–112)
CO2: 28 meq/L (ref 19–32)
Creatinine, Ser: 1.13 mg/dL (ref 0.40–1.20)
GFR: 60.42 mL/min (ref >60.00)
Glucose, Bld: 86 mg/dL (ref 70–99)
POTASSIUM: 4.2 meq/L (ref 3.5–5.1)
SODIUM: 139 meq/L (ref 135–145)
Total Bilirubin: 0.3 mg/dL (ref 0.2–1.2)
Total Protein: 7.4 g/dL (ref 6.0–8.3)

## 2018-01-20 NOTE — Progress Notes (Signed)
Name: Cassandra Dyer   MRN: [id number]    DOB: 03-21-1941   Date:01/20/2018       Progress Note  Subjective  Chief Complaint  Chief Complaint  Patient presents with  . Follow-up    blood pressure    HPI  Hypertension -At her last visit on 12/20 we added toprol 50 for continued elevated blood pressure readings. She had been maintained on toprol in the past for blood pressure and tolerated this medication well. She had stopped it prior to establishing care with me because she ran out of refills -around October of last year. We also continued her amlodipine 10 dialy We discussed healthy diet and exercise and she was instructed to return in about 1 month for a follow up. Today, she says she has been back on toprol 50 and continued amlodipine 10 daily as instructed. She says she is tolerating both medications well and denies any adverse effects. She brings in home BP readings today : 130-142/55-65 for past several weeks. Denies headaches, vision changes, chest pain, shortness of breath, edema. She says she feels well.  BP Readings from Last 3 Encounters:  01/20/18 (!) 164/74  12/02/17 (!) 170/68  11/11/17 (!) 164/65     Patient Active Problem List   Diagnosis Date Noted  . Essential hypertension 02/15/2017  . Dyslipidemia 02/15/2017  . Renal cell carcinoma of right kidney ([place]) 02/15/2017  . Renal mass 01/13/2017    Past Surgical History:  Procedure Laterality Date  . ROBOT ASSISTED LAPAROSCOPIC NEPHRECTOMY Right 01/13/2017   Procedure: XI ROBOTIC ASSISTED LAPAROSCOPIC RADICAL NEPHRECTOMY WITH LYSIS OF ADHESION;  Surgeon: Alexis Frock, MD;  Location: [facility];  Service: Urology;  Laterality: Right;    Family History  Problem Relation Age of Onset  . Hypertension Mother   . Pancreatic cancer Father   . Breast cancer Sister     Social History   Socioeconomic History  . Marital status: Single    Spouse name: Not on file  . Number of children: 0  . Years of education: 9   . Highest education level: Not on file  Social Needs  . Financial resource strain: Not hard at all  . Food insecurity - worry: Never true  . Food insecurity - inability: Never true  . Transportation needs - medical: No  . Transportation needs - non-medical: No  Occupational History  . Occupation: Retired  Tobacco Use  . Smoking status: Former Smoker    Packs/day: 0.50    Years: 30.00    Pack years: 15.00    Types: Cigarettes    Last attempt to quit: 12/14/2000    Years since quitting: 17.1  . Smokeless tobacco: Never Used  Substance and Sexual Activity  . Alcohol use: Yes    Comment: occasional wine   . Drug use: No  . Sexual activity: Not on file  Other Topics Concern  . Not on file  Social History Narrative   Fun/Hobby: Watching TV and crossword puzzles     Current Outpatient Medications:  .  amLODipine (NORVASC) 10 MG tablet, Take 0.5 tablets (5 mg total) by mouth daily., Disp: 30 tablet, Rfl: 1 .  Ascorbic Acid (VITAMIN C PO), Take by mouth., Disp: , Rfl:  .  Calcium Carbonate-Vit D-Min (CALCIUM 1200 PO), Take by mouth., Disp: , Rfl:  .  Cholecalciferol (VITAMIN D PO), Take by mouth., Disp: , Rfl:  .  Garlic 2778 MG CAPS, Take by mouth., Disp: , Rfl:  .  metoprolol succinate (  TOPROL-XL) 50 MG 24 hr tablet, TAKE 1 TABLET BY MOUTH ONCE DAILY. TAKE  WITH  OR  IMMEDIATELY  FOLLOWING  A  MEAL, Disp: 30 tablet, Rfl: 1 .  NON FORMULARY, Mega Juice, Female balance vitamins, Disp: , Rfl:  No current facility-administered medications for this visit.   Facility-Administered Medications Ordered in Other Visits:  .  magnesium citrate solution 1 Bottle, 1 Bottle, Oral, Once, Alexis Frock, MD  No Known Allergies   ROS See HPI  Objective  Vitals:   01/20/18 1559  BP: (!) 164/74  Pulse: 85  Resp: 16  Temp: 98.9 F (37.2 C)  TempSrc: Oral  SpO2: 96%  Weight: 153 lb 6.4 oz (69.6 kg)  Height: 5\' 2"  (1.575 m)    Body mass index is 28.06 kg/m.  Physical Exam Vital  signs reviewed. Constitutional: Patient appears well-developed and well-nourished. No distress.  HENT: Head: Normocephalic and atraumatic. Nose: Nose normal. Mouth/Throat: Oropharynx is clear and moist. No oropharyngeal exudate.  Eyes: Conjunctivae and EOM are normal. No scleral icterus.  Neck: Normal range of motion. Neck supple.  Cardiovascular: Normal rate, regular rhythm and normal heart sounds. No BLE edema. Distal pulses intact. Pulmonary/Chest: Effort normal and breath sounds normal. No respiratory distress. Musculoskeletal: Normal range of motion, no joint effusions. No gross deformities Neurological: She is alert and oriented to person, place, and time. Coordination, balance, strength, speech and gait are normal.  Skin: Skin is warm and dry. No rash noted. No erythema.  Psychiatric: Patient has a normal mood and affect. behavior is normal. Judgment and thought content normal.  PHQ2/9: Depression screen [facility] 2/9 11/11/2017 02/15/2017  Decreased Interest 0 0  Down, Depressed, Hopeless 0 0  PHQ - 2 Score 0 0  Altered sleeping 0 -  Tired, decreased energy 0 -  Change in appetite 0 -  Feeling bad or failure about yourself  0 -  Trouble concentrating 0 -  Moving slowly or fidgety/restless 0 -  Suicidal thoughts 0 -  PHQ-9 Score 0 -  Difficult doing work/chores Not difficult at all -    Fall Risk: Fall Risk  11/11/2017 02/15/2017  Falls in the past year? No No   Assessment & Plan RTC in about 1 month for BP follow up- she will bring log and cuff from home for calibration

## 2018-01-20 NOTE — Patient Instructions (Addendum)
Please head downstairs for lab work.  Please try to check your blood pressure once daily or at least a few times a week, at the same time each day, and keep a log. Please bring your log and your blood pressure cuff back in about 1 month, so we can calibrate your cuff and make sure your blood pressure is stable.  It was good to see you. Thanks for letting me take care of you today :)   Mediterranean Diet A Mediterranean diet refers to food and lifestyle choices that are based on the traditions of countries located on the [employer]. This way of eating has been shown to help prevent certain conditions and improve outcomes for people who have chronic diseases, like kidney disease and heart disease. What are tips for following this plan? Lifestyle  Cook and eat meals together with your family, when possible.  Drink enough fluid to keep your urine clear or pale yellow.  Be physically active every day. This includes: ? Aerobic exercise like running or swimming. ? Leisure activities like gardening, walking, or housework.  Get 7-8 hours of sleep each night.  If recommended by your health care provider, drink red wine in moderation. This means 1 glass a day for nonpregnant women and 2 glasses a day for men. A glass of wine equals 5 oz (150 mL). Reading food labels  Check the serving size of packaged foods. For foods such as rice and pasta, the serving size refers to the amount of cooked product, not dry.  Check the total fat in packaged foods. Avoid foods that have saturated fat or trans fats.  Check the ingredients list for added sugars, such as corn syrup. Shopping  At the grocery store, buy most of your food from the areas near the walls of the store. This includes: ? Fresh fruits and vegetables (produce). ? Grains, beans, nuts, and seeds. Some of these may be available in unpackaged forms or large amounts (in bulk). ? Fresh seafood. ? Poultry and eggs. ? Low-fat dairy  products.  Buy whole ingredients instead of prepackaged foods.  Buy fresh fruits and vegetables in-season from local farmers markets.  Buy frozen fruits and vegetables in resealable bags.  If you do not have access to quality fresh seafood, buy precooked frozen shrimp or canned fish, such as tuna, salmon, or sardines.  Buy small amounts of raw or cooked vegetables, salads, or olives from the deli or salad bar at your store.  Stock your pantry so you always have certain foods on hand, such as olive oil, canned tuna, canned tomatoes, rice, pasta, and beans. Cooking  Cook foods with extra-virgin olive oil instead of using butter or other vegetable oils.  Have meat as a side dish, and have vegetables or grains as your main dish. This means having meat in small portions or adding small amounts of meat to foods like pasta or stew.  Use beans or vegetables instead of meat in common dishes like chili or lasagna.  Experiment with different cooking methods. Try roasting or broiling vegetables instead of steaming or sauteing them.  Add frozen vegetables to soups, stews, pasta, or rice.  Add nuts or seeds for added healthy fat at each meal. You can add these to yogurt, salads, or vegetable dishes.  Marinate fish or vegetables using olive oil, lemon juice, garlic, and fresh herbs. Meal planning  Plan to eat 1 vegetarian meal one day each week. Try to work up to 2 vegetarian meals, if possible.  Eat seafood 2 or more times a week.  Have healthy snacks readily available, such as: ? Vegetable sticks with hummus. ? [place] yogurt. ? Fruit and nut trail mix.  Eat balanced meals throughout the week. This includes: ? Fruit: 2-3 servings a day ? Vegetables: 4-5 servings a day ? Low-fat dairy: 2 servings a day ? Fish, poultry, or lean meat: 1 serving a day ? Beans and legumes: 2 or more servings a week ? Nuts and seeds: 1-2 servings a day ? Whole grains: 6-8 servings a day ? Extra-virgin  olive oil: 3-4 servings a day  Limit red meat and sweets to only a few servings a month What are my food choices?  Mediterranean diet ? Recommended ? Grains: Whole-grain pasta. Brown rice. Bulgar wheat. Polenta. Couscous. Whole-wheat bread. [place]. ? Vegetables: Artichokes. Beets. Broccoli. Cabbage. Carrots. Eggplant. Green beans. Chard. Kale. Spinach. Onions. Leeks. Peas. Squash. Tomatoes. Peppers. Radishes. ? Fruits: Apples. Apricots. Avocado. Berries. Bananas. Cherries. Dates. Figs. Grapes. Lemons. Melon. Oranges. Peaches. Plums. Pomegranate. ? Meats and other protein foods: Beans. Almonds. Sunflower seeds. Pine nuts. Peanuts. [place]. Salmon. Scall[place]. Shrimp. [place]. Tilapia. Clams. Oysters. Eggs. ? Dairy: Low-fat milk. Cheese. Greek yogurt. ? Beverages: Water. Red wine. Herbal tea. ? Fats and oils: Extra virgin olive oil. Avocado oil. Grape seed oil. ? Sweets and desserts: [place] yogurt with honey. Baked apples. Poached pears. Trail mix. ? Seasoning and other foods: Basil. Cilantro. Coriander. Cumin. Mint. Parsley. Sage. Rosemary. Tarragon. Garlic. Oregano. Thyme. Pepper. Balsalmic vinegar. Tahini. Hummus. Tomato sauce. Olives. Mushrooms. ? Limit these ? Grains: Prepackaged pasta or rice dishes. Prepackaged cereal with added sugar. ? Vegetables: Deep fried potatoes (french fries). ? Fruits: Fruit canned in syrup. ? Meats and other protein foods: Beef. Pork. Lamb. Poultry with skin. Hot dogs. [place] Salines. ? Dairy: Ice cream. Sour cream. Whole milk. ? Beverages: Juice. Sugar-sweetened soft drinks. Beer. Liquor and spirits. ? Fats and oils: Butter. Canola oil. Vegetable oil. Beef fat (tallow). Lard. ? Sweets and desserts: Cookies. Cakes. Pies. Candy. ? Seasoning and other foods: Mayonnaise. Premade sauces and marinades. ? The items listed may not be a complete list. Talk with your dietitian about what dietary choices are right for you. Summary  The Mediterranean diet includes both food  and lifestyle choices.  Eat a variety of fresh fruits and vegetables, beans, nuts, seeds, and whole grains.  Limit the amount of red meat and sweets that you eat.  Talk with your health care provider about whether it is safe for you to drink red wine in moderation. This means 1 glass a day for nonpregnant women and 2 glasses a day for men. A glass of wine equals 5 oz (150 mL). This information is not intended to replace advice given to you by your health care provider. Make sure you discuss any questions you have with your health care provider. Document Released: 07/23/2016 Document Revised: 08/25/2016 Document Reviewed: 07/23/2016 Elsevier Interactive Patient Education  [employer].

## 2018-01-20 NOTE — Assessment & Plan Note (Signed)
Systolic BP elevated in office but not on home readings, diastolic BP is actually a little low. She is tolerating toprol and amlodipine well- we will continue. We will not adjust medications today due to lower diastolic readings and reported normal home readings. She will continue to monitor BP at home and come back in about 1 month for follow up, she will bring her BP cuff for calibration Healthy diet and reduction of salt discussed today- See AVS for education provided to patient - CBC with Differential/Platelet; Future - Comprehensive metabolic panel; Future

## 2018-01-26 ENCOUNTER — Other Ambulatory Visit: Payer: Self-pay | Admitting: Nurse Practitioner

## 2018-01-26 DIAGNOSIS — I10 Essential (primary) hypertension: Secondary | ICD-10-CM

## 2018-02-10 ENCOUNTER — Telehealth: Payer: Self-pay

## 2018-02-10 NOTE — Telephone Encounter (Signed)
Left message advising patient that insurance has been verified for prolia injections, estimated $250 copay---patient can schedule nurse visit at her earliest  Convenience to get started with first injection---any questions, can talk with Roland Lipke,RN at [facility] office

## 2018-02-17 ENCOUNTER — Encounter: Payer: Self-pay | Admitting: Nurse Practitioner

## 2018-02-17 ENCOUNTER — Ambulatory Visit (INDEPENDENT_AMBULATORY_CARE_PROVIDER_SITE_OTHER): Payer: Medicare Other | Admitting: Nurse Practitioner

## 2018-02-17 VITALS — BP 164/64 | HR 71 | Resp 16 | Ht 62.0 in | Wt 151.0 lb

## 2018-02-17 DIAGNOSIS — M810 Age-related osteoporosis without current pathological fracture: Secondary | ICD-10-CM

## 2018-02-17 DIAGNOSIS — I10 Essential (primary) hypertension: Secondary | ICD-10-CM

## 2018-02-17 NOTE — Progress Notes (Signed)
Name: Cassandra Dyer   MRN: [id number]    DOB: [birth year]   Date:02/17/2018       Progress Note  Subjective  Chief Complaint  Chief Complaint  Patient presents with  . Follow-up    blood pressure    HPI Cassandra Dyer is here for a blood pressure follow up. We will also discuss her recent bone density scan.  Hypertension -maintained on amlodipine 10 and toprol 50 daily Reports daily medication compliance without adverse medication effects. Reports home readings of 120-140/55-65, she checks daily. She brought her own BP monitor today which we calibrated for accuracy. She enies headaches, vision changes, chest pain, shortness of breath, edema. She says she feels well overall, but does get very anxious about coming in for appointments and says she has always had higher blood pressure readings in clinical settings  BP Readings from Last 3 Encounters:  02/17/18 (!) 164/64  01/20/18 (!) 164/74  12/02/17 (!) 170/68   Osteoporosis- Bone density test on 1/17 shows osteoporosis She has started taking calcium 1200 mg and vitamin D 800 IU daily for osteoporosis but declines additional medical therapies She denies falls, joint pain, back pain.  Patient Active Problem List   Diagnosis Date Noted  . Essential hypertension 02/15/2017  . Dyslipidemia 02/15/2017  . Renal cell carcinoma of right kidney ([place]) 02/15/2017  . Renal mass 01/13/2017    Past Surgical History:  Procedure Laterality Date  . ROBOT ASSISTED LAPAROSCOPIC NEPHRECTOMY Right 01/13/2017   Procedure: XI ROBOTIC ASSISTED LAPAROSCOPIC RADICAL NEPHRECTOMY WITH LYSIS OF ADHESION;  Surgeon: Alexis Frock, MD;  Location: [facility];  Service: Urology;  Laterality: Right;    Family History  Problem Relation Age of Onset  . Hypertension Mother   . Pancreatic cancer Father   . Breast cancer Sister     Social History   Socioeconomic History  . Marital status: Single    Spouse name: Not on file  . Number of children: 0  . Years  of education: 9  . Highest education level: Not on file  Social Needs  . Financial resource strain: Not hard at all  . Food insecurity - worry: Never true  . Food insecurity - inability: Never true  . Transportation needs - medical: No  . Transportation needs - non-medical: No  Occupational History  . Occupation: Retired  Tobacco Use  . Smoking status: Former Smoker    Packs/day: 0.50    Years: 30.00    Pack years: 15.00    Types: Cigarettes    Last attempt to quit: 12/14/2000    Years since quitting: 17.1  . Smokeless tobacco: Never Used  Substance and Sexual Activity  . Alcohol use: Yes    Comment: occasional wine   . Drug use: No  . Sexual activity: Not on file  Other Topics Concern  . Not on file  Social History Narrative   Fun/Hobby: Watching TV and crossword puzzles     Current Outpatient Medications:  .  amLODipine (NORVASC) 10 MG tablet, Take 0.5 tablets (5 mg total) by mouth daily., Disp: 30 tablet, Rfl: 1 .  Ascorbic Acid (VITAMIN C PO), Take by mouth., Disp: , Rfl:  .  Calcium Carbonate-Vit D-Min (CALCIUM 1200 PO), Take by mouth., Disp: , Rfl:  .  Cholecalciferol (VITAMIN D PO), Take by mouth., Disp: , Rfl:  .  Garlic 7824 MG CAPS, Take by mouth., Disp: , Rfl:  .  metoprolol succinate (TOPROL-XL) 50 MG 24 hr tablet, TAKE 1 TABLET BY  MOUTH ONCE DAILY, TAKE WITH OR IMMEDIATELY FOLLOWING A MEAL, Disp: 90 tablet, Rfl: 1 .  NON FORMULARY, Mega Juice, Female balance vitamins, Disp: , Rfl:  No current facility-administered medications for this visit.   Facility-Administered Medications Ordered in Other Visits:  .  magnesium citrate solution 1 Bottle, 1 Bottle, Oral, Once, Alexis Frock, MD  No Known Allergies   ROS See HPI  Objective  Vitals:   02/17/18 1634  BP: (!) 164/64  Pulse: 71  Resp: 16  SpO2: 98%  Weight: 151 lb (68.5 kg)  Height: 5\' 2"  (1.575 m)    Body mass index is 27.62 kg/m.  Physical Exam Vital signs reviewed. Constitutional:  Patient appears well-developed and well-nourished. No distress.  HENT: Head: Normocephalic and atraumatic. Nose: Nose normal. Mouth/Throat: Oropharynx is clear and moist. Eyes: Conjunctivae and EOM are normal. No scleral icterus.  Neck: Normal range of motion. Neck supple.  Cardiovascular: Normal rate, regular rhythm and normal heart sounds. No BLE edema. Distal pulses intact. Pulmonary/Chest: Effort normal and breath sounds normal. No respiratory distress. Musculoskeletal: Normal range of motion, no joint effusions. No gross deformities Neurological: She is alert and oriented to person, place, and time. Coordination, balance, strength, speech and gait are normal.  Skin: Skin is warm and dry. No rash noted. No erythema.  Psychiatric: Patient has a normal mood and affect. behavior is normal.   PHQ2/9: Depression screen [facility] 2/9 11/11/2017 02/15/2017  Decreased Interest 0 0  Down, Depressed, Hopeless 0 0  PHQ - 2 Score 0 0  Altered sleeping 0 -  Tired, decreased energy 0 -  Change in appetite 0 -  Feeling bad or failure about yourself  0 -  Trouble concentrating 0 -  Moving slowly or fidgety/restless 0 -  Suicidal thoughts 0 -  PHQ-9 Score 0 -  Difficult doing work/chores Not difficult at all -   Fall Risk: Fall Risk  11/11/2017 02/15/2017  Falls in the past year? No No     Assessment & Plan RTC in 6 months for routine F/U: HTN  -Reviewed Health Maintenance: up to date

## 2018-02-17 NOTE — Patient Instructions (Signed)
Continue to monitor your blood pressure at home and let me know If you are getting readings over 140/90.  Ill see you back in about 6 months !   Mediterranean Diet A Mediterranean diet refers to food and lifestyle choices that are based on the traditions of countries located on the [employer]. This way of eating has been shown to help prevent certain conditions and improve outcomes for people who have chronic diseases, like kidney disease and heart disease. What are tips for following this plan? Lifestyle  Cook and eat meals together with your family, when possible.  Drink enough fluid to keep your urine clear or pale yellow.  Be physically active every day. This includes: ? Aerobic exercise like running or swimming. ? Leisure activities like gardening, walking, or housework.  Get 7-8 hours of sleep each night.  If recommended by your health care provider, drink red wine in moderation. This means 1 glass a day for nonpregnant women and 2 glasses a day for men. A glass of wine equals 5 oz (150 mL). Reading food labels  Check the serving size of packaged foods. For foods such as rice and pasta, the serving size refers to the amount of cooked product, not dry.  Check the total fat in packaged foods. Avoid foods that have saturated fat or trans fats.  Check the ingredients list for added sugars, such as corn syrup. Shopping  At the grocery store, buy most of your food from the areas near the walls of the store. This includes: ? Fresh fruits and vegetables (produce). ? Grains, beans, nuts, and seeds. Some of these may be available in unpackaged forms or large amounts (in bulk). ? Fresh seafood. ? Poultry and eggs. ? Low-fat dairy products.  Buy whole ingredients instead of prepackaged foods.  Buy fresh fruits and vegetables in-season from local farmers markets.  Buy frozen fruits and vegetables in resealable bags.  If you do not have access to quality fresh seafood, buy  precooked frozen shrimp or canned fish, such as tuna, salmon, or sardines.  Buy small amounts of raw or cooked vegetables, salads, or olives from the deli or salad bar at your store.  Stock your pantry so you always have certain foods on hand, such as olive oil, canned tuna, canned tomatoes, rice, pasta, and beans. Cooking  Cook foods with extra-virgin olive oil instead of using butter or other vegetable oils.  Have meat as a side dish, and have vegetables or grains as your main dish. This means having meat in small portions or adding small amounts of meat to foods like pasta or stew.  Use beans or vegetables instead of meat in common dishes like chili or lasagna.  Experiment with different cooking methods. Try roasting or broiling vegetables instead of steaming or sauteing them.  Add frozen vegetables to soups, stews, pasta, or rice.  Add nuts or seeds for added healthy fat at each meal. You can add these to yogurt, salads, or vegetable dishes.  Marinate fish or vegetables using olive oil, lemon juice, garlic, and fresh herbs. Meal planning  Plan to eat 1 vegetarian meal one day each week. Try to work up to 2 vegetarian meals, if possible.  Eat seafood 2 or more times a week.  Have healthy snacks readily available, such as: ? Vegetable sticks with hummus. ? [place] yogurt. ? Fruit and nut trail mix.  Eat balanced meals throughout the week. This includes: ? Fruit: 2-3 servings a day ? Vegetables: 4-5 servings  a day ? Low-fat dairy: 2 servings a day ? Fish, poultry, or lean meat: 1 serving a day ? Beans and legumes: 2 or more servings a week ? Nuts and seeds: 1-2 servings a day ? Whole grains: 6-8 servings a day ? Extra-virgin olive oil: 3-4 servings a day  Limit red meat and sweets to only a few servings a month What are my food choices?  Mediterranean diet ? Recommended ? Grains: Whole-grain pasta. Brown rice. Bulgar wheat. Polenta. Couscous. Whole-wheat bread. [place]. ? Vegetables: Artichokes. Beets. Broccoli. Cabbage. Carrots. Eggplant. Green beans. Chard. Kale. Spinach. Onions. Leeks. Peas. Squash. Tomatoes. Peppers. Radishes. ? Fruits: Apples. Apricots. Avocado. Berries. Bananas. Cherries. Dates. Figs. Grapes. Lemons. Melon. Oranges. Peaches. Plums. Pomegranate. ? Meats and other protein foods: Beans. Almonds. Sunflower seeds. Pine nuts. Peanuts. [place]. Salmon. Scallops. Shrimp. Ogle. Tilapia. Clams. Oysters. Eggs. ? Dairy: Low-fat milk. Cheese. Greek yogurt. ? Beverages: Water. Red wine. Herbal tea. ? Fats and oils: Extra virgin olive oil. Avocado oil. Grape seed oil. ? Sweets and desserts: [place] yogurt with honey. Baked apples. Poached pears. Trail mix. ? Seasoning and other foods: Basil. Cilantro. Coriander. Cumin. Mint. Parsley. Sage. Rosemary. Tarragon. Garlic. Oregano. Thyme. Pepper. Balsalmic vinegar. Tahini. Hummus. Tomato sauce. Olives. Mushrooms. ? Limit these ? Grains: Prepackaged pasta or rice dishes. Prepackaged cereal with added sugar. ? Vegetables: Deep fried potatoes (french fries). ? Fruits: Fruit canned in syrup. ? Meats and other protein foods: Beef. Pork. Lamb. Poultry with skin. Hot dogs. Berniece Salines. ? Dairy: Ice cream. Sour cream. Whole milk. ? Beverages: Juice. Sugar-sweetened soft drinks. Beer. Liquor and spirits. ? Fats and oils: Butter. Canola oil. Vegetable oil. Beef fat (tallow). Lard. ? Sweets and desserts: Cookies. Cakes. Pies. Candy. ? Seasoning and other foods: Mayonnaise. Premade sauces and marinades. ? The items listed may not be a complete list. Talk with your dietitian about what dietary choices are right for you. Summary  The Mediterranean diet includes both food and lifestyle choices.  Eat a variety of fresh fruits and vegetables, beans, nuts, seeds, and whole grains.  Limit the amount of red meat and sweets that you eat.  Talk with your health care provider about whether it is safe for you to drink red wine in  moderation. This means 1 glass a day for nonpregnant women and 2 glasses a day for men. A glass of wine equals 5 oz (150 mL). This information is not intended to replace advice given to you by your health care provider. Make sure you discuss any questions you have with your health care provider. Document Released: 07/23/2016 Document Revised: 08/25/2016 Document Reviewed: 07/23/2016 Elsevier Interactive Patient Education  [employer].

## 2018-02-20 ENCOUNTER — Encounter: Payer: Self-pay | Admitting: Nurse Practitioner

## 2018-02-20 DIAGNOSIS — M810 Age-related osteoporosis without current pathological fracture: Secondary | ICD-10-CM | POA: Insufficient documentation

## 2018-02-20 NOTE — Assessment & Plan Note (Signed)
Noted on recent DEXA We discussed pharmacological options to treat osteoporosis including prolia, she declines She will continue OTC vitamin D and calcium supplements and we will repeat DEXA in 2-5 years

## 2018-02-20 NOTE — Assessment & Plan Note (Signed)
She has normal readings at home, and her monitor appears to be accurate We will have her continue amlodipine and toprol at current dosages She will continue to monitor her blood pressure readings at home and F/U for readings over 140/90, otherwise routine F/U in 6 months Discussed the role of healthy diet,reduction of salt, and exercise in the management of hypertension-See AVS for information provided to patient

## 2018-02-21 ENCOUNTER — Telehealth: Payer: Self-pay

## 2018-02-21 NOTE — Telephone Encounter (Signed)
Received message from [facility] stating that pt called to let us know that she would prefer to take OTC vitamin D and Calcium supplements for her osteoporosis and declines taking prolia injections at this time.

## 2018-02-21 NOTE — Telephone Encounter (Signed)
Patient has been removed from prolia portal

## 2018-03-15 ENCOUNTER — Other Ambulatory Visit: Payer: Self-pay | Admitting: Nurse Practitioner

## 2018-03-15 DIAGNOSIS — I10 Essential (primary) hypertension: Secondary | ICD-10-CM

## 2018-06-13 ENCOUNTER — Other Ambulatory Visit: Payer: Self-pay | Admitting: Nurse Practitioner

## 2018-06-13 DIAGNOSIS — I10 Essential (primary) hypertension: Secondary | ICD-10-CM

## 2018-07-25 ENCOUNTER — Other Ambulatory Visit: Payer: Self-pay | Admitting: Nurse Practitioner

## 2018-07-25 DIAGNOSIS — I10 Essential (primary) hypertension: Secondary | ICD-10-CM

## 2018-08-25 ENCOUNTER — Ambulatory Visit (INDEPENDENT_AMBULATORY_CARE_PROVIDER_SITE_OTHER): Payer: Medicare Other | Admitting: Nurse Practitioner

## 2018-08-25 ENCOUNTER — Encounter: Payer: Self-pay | Admitting: Nurse Practitioner

## 2018-08-25 DIAGNOSIS — I10 Essential (primary) hypertension: Secondary | ICD-10-CM

## 2018-08-25 MED ORDER — AMLODIPINE BESYLATE 10 MG PO TABS: 10.0000 mg | ORAL_TABLET | Freq: Every day | ORAL | 1 refills | Status: DC

## 2018-08-25 NOTE — Patient Instructions (Signed)
Please try to check your blood pressure once daily or at least a few times a week, at the same time each day, and keep a log. Let me know if any readings over 140/90.   DASH Eating Plan DASH stands for "Dietary Approaches to Stop Hypertension." The DASH eating plan is a healthy eating plan that has been shown to reduce high blood pressure (hypertension). It may also reduce your risk for type 2 diabetes, heart disease, and stroke. The DASH eating plan may also help with weight loss. What are tips for following this plan? General guidelines  Avoid eating more than 2,300 mg (milligrams) of salt (sodium) a day. If you have hypertension, you may need to reduce your sodium intake to 1,500 mg a day.  Limit alcohol intake to no more than 1 drink a day for nonpregnant women and 2 drinks a day for men. One drink equals 12 oz of beer, 5 oz of wine, or 1 oz of hard liquor.  Work with your health care provider to maintain a healthy body weight or to lose weight. Ask what an ideal weight is for you.  Get at least 30 minutes of exercise that causes your heart to beat faster (aerobic exercise) most days of the week. Activities may include walking, swimming, or biking.  Work with your health care provider or diet and nutrition specialist (dietitian) to adjust your eating plan to your individual calorie needs. Reading food labels  Check food labels for the amount of sodium per serving. Choose foods with less than 5 percent of the Daily Value of sodium. Generally, foods with less than 300 mg of sodium per serving fit into this eating plan.  To find whole grains, look for the word "whole" as the first word in the ingredient list. Shopping  Buy products labeled as "low-sodium" or "no salt added."  Buy fresh foods. Avoid canned foods and premade or frozen meals. Cooking  Avoid adding salt when cooking. Use salt-free seasonings or herbs instead of table salt or sea salt. Check with your health care provider  or pharmacist before using salt substitutes.  Do not fry foods. Cook foods using healthy methods such as baking, boiling, grilling, and broiling instead.  Cook with heart-healthy oils, such as olive, canola, soybean, or sunflower oil. Meal planning   Eat a balanced diet that includes: ? 5 or more servings of fruits and vegetables each day. At each meal, try to fill half of your plate with fruits and vegetables. ? Up to 6-8 servings of whole grains each day. ? Less than 6 oz of lean meat, poultry, or fish each day. A 3-oz serving of meat is about the same size as a deck of cards. One egg equals 1 oz. ? 2 servings of low-fat dairy each day. ? A serving of nuts, seeds, or beans 5 times each week. ? Heart-healthy fats. Healthy fats called Omega-3 fatty acids are found in foods such as flaxseeds and coldwater fish, like sardines, salmon, and mackerel.  Limit how much you eat of the following: ? Canned or prepackaged foods. ? Food that is high in trans fat, such as fried foods. ? Food that is high in saturated fat, such as fatty meat. ? Sweets, desserts, sugary drinks, and other foods with added sugar. ? Full-fat dairy products.  Do not salt foods before eating.  Try to eat at least 2 vegetarian meals each week.  Eat more home-cooked food and less restaurant, buffet, and fast food.  When  eating at a restaurant, ask that your food be prepared with less salt or no salt, if possible. What foods are recommended? The items listed may not be a complete list. Talk with your dietitian about what dietary choices are best for you. Grains Whole-grain or whole-wheat bread. Whole-grain or whole-wheat pasta. Brown rice. [place]. Bulgur. Whole-grain and low-sodium cereals. Pita bread. Low-fat, low-sodium crackers. Whole-wheat flour tortillas. Vegetables Fresh or frozen vegetables (raw, steamed, roasted, or grilled). Low-sodium or reduced-sodium tomato and vegetable juice. Low-sodium or  reduced-sodium tomato sauce and tomato paste. Low-sodium or reduced-sodium canned vegetables. Fruits All fresh, dried, or frozen fruit. Canned fruit in natural juice (without added sugar). Meat and other protein foods Skinless chicken or [place]. Ground chicken or [place]. Pork with fat trimmed off. Fish and seafood. Egg whites. Dried beans, peas, or lentils. Unsalted nuts, nut butters, and seeds. Unsalted canned beans. Lean cuts of beef with fat trimmed off. Low-sodium, lean deli meat. Dairy Low-fat (1%) or fat-free (skim) milk. Fat-free, low-fat, or reduced-fat cheeses. Nonfat, low-sodium ricotta or cottage cheese. Low-fat or nonfat yogurt. Low-fat, low-sodium cheese. Fats and oils Soft margarine without trans fats. Vegetable oil. Low-fat, reduced-fat, or light mayonnaise and salad dressings (reduced-sodium). Canola, safflower, olive, soybean, and sunflower oils. Avocado. Seasoning and other foods Herbs. Spices. Seasoning mixes without salt. Unsalted popcorn and pretzels. Fat-free sweets. What foods are not recommended? The items listed may not be a complete list. Talk with your dietitian about what dietary choices are best for you. Grains Baked goods made with fat, such as croissants, muffins, or some breads. Dry pasta or rice meal packs. Vegetables Creamed or fried vegetables. Vegetables in a cheese sauce. Regular canned vegetables (not low-sodium or reduced-sodium). Regular canned tomato sauce and paste (not low-sodium or reduced-sodium). Regular tomato and vegetable juice (not low-sodium or reduced-sodium). Angie Fava. Olives. Fruits Canned fruit in a light or heavy syrup. Fried fruit. Fruit in cream or butter sauce. Meat and other protein foods Fatty cuts of meat. Ribs. Fried meat. Berniece Salines. Sausage. Bologna and other processed lunch meats. Salami. Fatback. Hotdogs. Bratwurst. Salted nuts and seeds. Canned beans with added salt. Canned or smoked fish. Whole eggs or egg yolks. Chicken or [place]  with skin. Dairy Whole or 2% milk, cream, and half-and-half. Whole or full-fat cream cheese. Whole-fat or sweetened yogurt. Full-fat cheese. Nondairy creamers. Whipped toppings. Processed cheese and cheese spreads. Fats and oils Butter. Stick margarine. Lard. Shortening. Ghee. Bacon fat. Tropical oils, such as coconut, palm kernel, or palm oil. Seasoning and other foods Salted popcorn and pretzels. Onion salt, garlic salt, seasoned salt, table salt, and sea salt. Worcestershire sauce. Tartar sauce. Barbecue sauce. Teriyaki sauce. Soy sauce, including reduced-sodium. Steak sauce. Canned and packaged gravies. Fish sauce. Oyster sauce. Cocktail sauce. Horseradish that you find on the shelf. Ketchup. Mustard. Meat flavorings and tenderizers. Bouillon cubes. Hot sauce and Tabasco sauce. Premade or packaged marinades. Premade or packaged taco seasonings. Relishes. Regular salad dressings. Where to find more information:  National Heart, Lung, and [place]: https://[place]-eaton.com/  American Heart Association: www.heart.org Summary  The DASH eating plan is a healthy eating plan that has been shown to reduce high blood pressure (hypertension). It may also reduce your risk for type 2 diabetes, heart disease, and stroke.  With the DASH eating plan, you should limit salt (sodium) intake to 2,300 mg a day. If you have hypertension, you may need to reduce your sodium intake to 1,500 mg a day.  When on the DASH eating plan, aim  to eat more fresh fruits and vegetables, whole grains, lean proteins, low-fat dairy, and heart-healthy fats.  Work with your health care provider or diet and nutrition specialist (dietitian) to adjust your eating plan to your individual calorie needs. This information is not intended to replace advice given to you by your health care provider. Make sure you discuss any questions you have with your health care provider. Document Released: 11/19/2011 Document Revised: 11/23/2016  Document Reviewed: 11/23/2016 Elsevier Interactive Patient Education  [employer].

## 2018-08-25 NOTE — Assessment & Plan Note (Signed)
BP remains elevated on clinical readings with documented normal home readings, her home monitor was calibrated for accuracy at last OV She feels well, active 77 yo, I don't think medication changes or further testing indicated today, we will continue her amlodipine and toprol at current dosages Continue to monitor home readings RTC in 6 months for routine F/U, or sooner for readings >140/90 Additional education provided on AVS - amLODipine (NORVASC) 10 MG tablet; Take 1 tablet (10 mg total) by mouth daily.  Dispense: 90 tablet; Refill: 1

## 2018-08-25 NOTE — Progress Notes (Signed)
Name: Cassandra Dyer   MRN: [id number]    DOB: Jul 10, 1941   Date:08/25/2018       Progress Note  Subjective  Chief Complaint Follow up  HPI Cassandra Dyer is here today for blood pressure follow up, maintained on amlodipine 10 and toprol 50 daily. Reports daily medication compliance without noted adverse medication effects. Reports checking her blood pressure daily, recent home readings 120s-140s/70s. Denies weakness, confusion, syncope, headaches, vision changes, chest pain, shortness of breath, edema, falls. Says she feels well today, has recently been trying to eat healthier meals, avoid sweets and exercise more.  BP Readings from Last 3 Encounters:  08/25/18 (!) 174/88  02/17/18 (!) 164/64  01/20/18 (!) 164/74    Patient Active Problem List   Diagnosis Date Noted  . Osteoporosis 02/20/2018  . Essential hypertension 02/15/2017  . Dyslipidemia 02/15/2017  . Renal cell carcinoma of right kidney ([place]) 02/15/2017  . Renal mass 01/13/2017    Past Surgical History:  Procedure Laterality Date  . ROBOT ASSISTED LAPAROSCOPIC NEPHRECTOMY Right 01/13/2017   Procedure: XI ROBOTIC ASSISTED LAPAROSCOPIC RADICAL NEPHRECTOMY WITH LYSIS OF ADHESION;  Surgeon: Alexis Frock, MD;  Location: [facility];  Service: Urology;  Laterality: Right;    Family History  Problem Relation Age of Onset  . Hypertension Mother   . Pancreatic cancer Father   . Breast cancer Sister     Social History   Socioeconomic History  . Marital status: Single    Spouse name: Not on file  . Number of children: 0  . Years of education: 9  . Highest education level: Not on file  Occupational History  . Occupation: Retired  Scientific laboratory technician  . Financial resource strain: Not hard at all  . Food insecurity:    Worry: Never true    Inability: Never true  . Transportation needs:    Medical: No    Non-medical: No  Tobacco Use  . Smoking status: Former Smoker    Packs/day: 0.50    Years: 30.00    Pack years: 15.00   Types: Cigarettes    Last attempt to quit: 12/14/2000    Years since quitting: 17.7  . Smokeless tobacco: Never Used  Substance and Sexual Activity  . Alcohol use: Yes    Comment: occasional wine   . Drug use: No  . Sexual activity: Not on file  Lifestyle  . Physical activity:    Days per week: Not on file    Minutes per session: Not on file  . Stress: Not on file  Relationships  . Social connections:    Talks on phone: Not on file    Gets together: Not on file    Attends religious service: Not on file    Active member of club or organization: Not on file    Attends meetings of clubs or organizations: Not on file    Relationship status: Not on file  . Intimate partner violence:    Fear of current or ex partner: Not on file    Emotionally abused: Not on file    Physically abused: Not on file    Forced sexual activity: Not on file  Other Topics Concern  . Not on file  Social History Narrative   Fun/Hobby: Watching TV and crossword puzzles     Current Outpatient Medications:  .  amLODipine (NORVASC) 5 MG tablet, TAKE 1 TABLET BY MOUTH ONCE DAILY (Patient taking differently: Take 10 mg by mouth daily. ), Disp: 90 tablet, Rfl: 1 .  Ascorbic Acid (VITAMIN C PO), Take by mouth., Disp: , Rfl:  .  Calcium Carbonate-Vit D-Min (CALCIUM 1200 PO), Take by mouth., Disp: , Rfl:  .  Cholecalciferol (VITAMIN D PO), Take by mouth., Disp: , Rfl:  .  Garlic 2482 MG CAPS, Take by mouth., Disp: , Rfl:  .  metoprolol succinate (TOPROL-XL) 50 MG 24 hr tablet, TAKE 1 TABLET BY MOUTH ONCE DAILY **TAKE  WITH  OR  IMMEDIATELY  FOLLOWING  A  MEAL**, Disp: 90 tablet, Rfl: 0 .  NON FORMULARY, Mega Juice, Female balance vitamins, Disp: , Rfl:  .  amLODipine (NORVASC) 10 MG tablet, Take 0.5 tablets (5 mg total) by mouth daily. (Patient not taking: Reported on 08/25/2018), Disp: 30 tablet, Rfl: 1 No current facility-administered medications for this visit.   Facility-Administered Medications Ordered in Other  Visits:  .  magnesium citrate solution 1 Bottle, 1 Bottle, Oral, Once, Alexis Frock, MD  No Known Allergies   ROS See HPI  Objective  Vitals:   08/25/18 1609  BP: (!) 174/88  Pulse: 68  SpO2: 95%  Weight: 149 lb (67.6 kg)  Height: 5\' 2"  (1.575 m)    Body mass index is 27.25 kg/m.  Physical Exam Vital signs reviewed. Constitutional: Patient appears well-developed and well-nourished. No distress.  HENT: Head: Normocephalic and atraumatic.  Nose: Nose normal. Mouth/Throat: Oropharynx is clear and moist. No oropharyngeal exudate.  Eyes: Conjunctivae and EOM are normal. Pupils are equal, round, and reactive to light. No scleral icterus.  Neck: Normal range of motion. Neck supple. Cardiovascular: Normal rate, regular rhythm and normal heart sounds.  No murmur heard. No BLE edema. Distal pulses intact. Pulmonary/Chest: Effort normal and breath sounds normal. No respiratory distress. Neurological: She is alert and oriented to person, place, and time. No cranial nerve deficit. Coordination, balance, strength, speech and gait are normal.  Skin: Skin is warm and dry. No rash noted. No erythema.  Psychiatric: Patient has a normal mood and affect. behavior is normal. Judgment and thought content normal.   Assessment & Plan RTC in 6 months for routine F/U: HTN, annual labs  -Reviewed Health Maintenance: declines PNA, influenza vaccination

## 2018-09-12 ENCOUNTER — Other Ambulatory Visit: Payer: Self-pay | Admitting: Nurse Practitioner

## 2018-09-12 DIAGNOSIS — I10 Essential (primary) hypertension: Secondary | ICD-10-CM

## 2018-10-24 ENCOUNTER — Telehealth: Payer: Self-pay | Admitting: Nurse Practitioner

## 2018-10-24 ENCOUNTER — Other Ambulatory Visit: Payer: Self-pay | Admitting: Family

## 2018-10-24 DIAGNOSIS — I10 Essential (primary) hypertension: Secondary | ICD-10-CM

## 2018-10-24 MED ORDER — AMLODIPINE BESYLATE 10 MG PO TABS: 10.0000 mg | ORAL_TABLET | Freq: Every day | ORAL | 1 refills | Status: DC

## 2018-10-24 NOTE — Telephone Encounter (Signed)
Copied from [place] [phone]. Topic: Quick Communication - See Telephone Encounter >> Oct 24, 2018 12:49 PM Conception Chancy, NT wrote: CRM for notification. See Telephone encounter for: 10/24/18.  Patient is calling and states she did not pick up amLODipine (NORVASC) 10 MG tablet  on 08/25/18 when it was sent in. She would like this resent.  [place], [place] - [address] [place] [address] Phone: [phone] Fax: [phone]

## 2018-10-24 NOTE — Telephone Encounter (Signed)
Rx sent. See meds.  

## 2018-11-17 ENCOUNTER — Ambulatory Visit: Payer: Medicare Other

## 2018-11-24 ENCOUNTER — Ambulatory Visit (INDEPENDENT_AMBULATORY_CARE_PROVIDER_SITE_OTHER): Payer: Medicare Other | Admitting: *Deleted

## 2018-11-24 VITALS — BP 146/64 | HR 72 | Resp 17 | Ht 62.0 in | Wt 150.0 lb

## 2018-11-24 DIAGNOSIS — Z0000 Encounter for general adult medical examination without abnormal findings: Secondary | ICD-10-CM | POA: Diagnosis not present

## 2018-11-24 NOTE — Progress Notes (Signed)
Subjective:   Cassandra Dyer is a 77 y.o. female who presents for Medicare Annual (Subsequent) preventive examination.  Review of Systems:  No ROS.  Medicare Wellness Visit. Additional risk factors are reflected in the social history.  Cardiac Risk Factors include: advanced age (>33men, >56 women);dyslipidemia;hypertension Sleep patterns: feels rested on waking, gets up 1-2 times nightly to void and sleeps 6-7 hours nightly.    Home Safety/Smoke Alarms: Feels safe in home. Smoke alarms in place.  Living environment; residence and Firearm Safety: 1-story house/ trailer, no firearms. Lives alone, no needs for DME, good support system Seat Belt Safety/Bike Helmet: Wears seat belt.      Objective:     Vitals: BP (!) 146/64   Pulse 72   Resp 17   Ht 5\' 2"  (1.575 m)   Wt 150 lb (68 kg)   SpO2 98%   BMI 27.44 kg/m   Body mass index is 27.44 kg/m.  Advanced Directives 11/24/2018 11/11/2017 01/13/2017 01/12/2017 06/13/2016  Does Patient Have a Medical Advance Directive? No No No No No  Would patient like information on creating a medical advance directive? No - Patient declined Yes (ED - Information included in AVS) No - Patient declined - No - patient declined information    Tobacco Social History   Tobacco Use  Smoking Status Former Smoker  . Packs/day: 0.50  . Years: 30.00  . Pack years: 15.00  . Types: Cigarettes  . Last attempt to quit: 12/14/2000  . Years since quitting: 17.9  Smokeless Tobacco Never Used     Counseling given: Not Answered  Past Medical History:  Diagnosis Date  . Anemia   . Hepatitis    hx of hep C - 10-20 years ago   . History of blood transfusion   . Hyperlipidemia   . Hypertension    not taken b/p med in 2-3 years md deceased never went to another md    Past Surgical History:  Procedure Laterality Date  . ROBOT ASSISTED LAPAROSCOPIC NEPHRECTOMY Right 01/13/2017   Procedure: XI ROBOTIC ASSISTED LAPAROSCOPIC RADICAL NEPHRECTOMY WITH LYSIS OF  ADHESION;  Surgeon: Alexis Frock, MD;  Location: [facility];  Service: Urology;  Laterality: Right;   Family History  Problem Relation Age of Onset  . Hypertension Mother   . Pancreatic cancer Father   . Breast cancer Sister    Social History   Socioeconomic History  . Marital status: Single    Spouse name: Not on file  . Number of children: 0  . Years of education: 9  . Highest education level: Not on file  Occupational History  . Occupation: Retired  Scientific laboratory technician  . Financial resource strain: Not hard at all  . Food insecurity:    Worry: Never true    Inability: Never true  . Transportation needs:    Medical: No    Non-medical: No  Tobacco Use  . Smoking status: Former Smoker    Packs/day: 0.50    Years: 30.00    Pack years: 15.00    Types: Cigarettes    Last attempt to quit: 12/14/2000    Years since quitting: 17.9  . Smokeless tobacco: Never Used  Substance and Sexual Activity  . Alcohol use: Yes    Comment: occasional    . Drug use: No  . Sexual activity: Never  Lifestyle  . Physical activity:    Days per week: 5 days    Minutes per session: 20 min  . Stress: Not at  all  Relationships  . Social connections:    Talks on phone: More than three times a week    Gets together: More than three times a week    Attends religious service: More than 4 times per year    Active member of club or organization: Yes    Attends meetings of clubs or organizations: More than 4 times per year    Relationship status: Not on file  Other Topics Concern  . Not on file  Social History Narrative   Fun/Hobby: Watching TV and crossword puzzles    Outpatient Encounter Medications as of 11/24/2018  Medication Sig  . amLODipine (NORVASC) 10 MG tablet Take 1 tablet (10 mg total) by mouth daily.  . Ascorbic Acid (VITAMIN C PO) Take by mouth.  . Calcium Carbonate-Vit D-Min (CALCIUM 1200 PO) Take by mouth.  . Cholecalciferol (VITAMIN D PO) Take by mouth.  . Garlic 0076 MG CAPS  Take by mouth.  . metoprolol succinate (TOPROL-XL) 50 MG 24 hr tablet TAKE 1 TABLET BY MOUTH ONCE DAILY TAKE  WITH  OR  IMMEDIATELY  FOLLOWING  A  MEAL  . NON FORMULARY Mega Juice, Female balance vitamins  . Omega-3 Fatty Acids (FISH OIL PO) Take 1 tablet by mouth daily.   Facility-Administered Encounter Medications as of 11/24/2018  Medication  . magnesium citrate solution 1 Bottle    Activities of Daily Living In your present state of health, do you have any difficulty performing the following activities: 11/24/2018  Hearing? N  Vision? N  Difficulty concentrating or making decisions? N  Walking or climbing stairs? N  Dressing or bathing? N  Doing errands, shopping? N  Preparing Food and eating ? N  Using the Toilet? N  In the past six months, have you accidently leaked urine? N  Do you have problems with loss of bowel control? N  Managing your Medications? N  Managing your Finances? N  Housekeeping or managing your Housekeeping? N  Some recent data might be hidden    Patient Care Team: Lance Sell, NP as PCP - General (Nurse Practitioner)    Assessment:   This is a routine wellness examination for Cassandra Dyer. Physical assessment deferred to PCP.  Exercise Activities and Dietary recommendations Current Exercise Habits: Home exercise routine, Type of exercise: walking, Time (Minutes): 30, Frequency (Times/Week): 5, Weekly Exercise (Minutes/Week): 150, Intensity: Mild, Exercise limited by: None identified  Diet (meal preparation, eat out, water intake, caffeinated beverages, dairy products, fruits and vegetables): in general, a "healthy" diet  , well balanced. eats a variety of fruits and vegetables daily, limits salt, fat/cholesterol, sugar,carbohydrates,caffeine.  Reviewed heart healthy diet. Encouraged patient to increase daily water and healthy fluid intake.   Goals    . Patient Stated     Maintain current health status, continue to reach to individuals that need  help and stay active in church, enjoy life and family    . Patient Stated     Continue to enjoy family, friends, stay socially and spiritually connected.       Fall Risk Fall Risk  11/24/2018 11/11/2017 02/15/2017  Falls in the past year? 0 No No    Depression Screen PHQ 2/9 Scores 11/24/2018 11/11/2017 02/15/2017  PHQ - 2 Score 0 0 0  PHQ- 9 Score - 0 -     Cognitive Function MMSE - Mini Mental State Exam 11/24/2018 11/11/2017  Orientation to time 5 5  Orientation to Place 5 5  Registration 3 3  Attention/  Calculation 5 5  Recall 2 2  Language- name 2 objects 2 2  Language- repeat 1 1  Language- follow 3 step command 3 3  Language- read & follow direction 1 1  Write a sentence 1 1  Copy design 1 1  Total score 29 29        Immunization History  Administered Date(s) Administered  . Tdap 05/16/2015   Screening Tests Health Maintenance  Topic Date Due  . INFLUENZA VACCINE  03/15/2019 (Originally 07/14/2018)  . PNA vac Low Risk Adult (1 of 2 - PCV13) 11/25/2019 (Originally 05/06/2008)  . COLONOSCOPY  10/08/2019  . TETANUS/TDAP  05/15/2025  . DEXA SCAN  Completed      Plan:      Continue doing brain stimulating activities (puzzles, reading, adult coloring books, staying active) to keep memory sharp.   Continue to eat heart healthy diet (full of fruits, vegetables, whole grains, lean protein, water--limit salt, fat, and sugar intake) and increase physical activity as tolerated.   I have personally reviewed and noted the following in the patient's chart:   . Medical and social history . Use of alcohol, tobacco or illicit drugs  . Current medications and supplements . Functional ability and status . Nutritional status . Physical activity . Advanced directives . List of other physicians . Vitals . Screenings to include cognitive, depression, and falls . Referrals and appointments  In addition, I have reviewed and discussed with patient certain preventive  protocols, quality metrics, and best practice recommendations. A written personalized care plan for preventive services as well as general preventive health recommendations were provided to patient.     Michiel Cowboy, RN  11/24/2018

## 2018-11-24 NOTE — Patient Instructions (Signed)
Continue doing brain stimulating activities (puzzles, reading, adult coloring books, staying active) to keep memory sharp.   Continue to eat heart healthy diet (full of fruits, vegetables, whole grains, lean protein, water--limit salt, fat, and sugar intake) and increase physical activity as tolerated.   Cassandra Dyer , Thank you for taking time to come for your Medicare Wellness Visit. I appreciate your ongoing commitment to your health goals. Please review the following plan we discussed and let me know if I can assist you in the future.   These are the goals we discussed: Goals    . Patient Stated     Maintain current health status, continue to reach to individuals that need help and stay active in church, enjoy life and family    . Patient Stated     Continue to enjoy family, friends, stay socially and spiritually connected.       This is a list of the screening recommended for you and due dates:  Health Maintenance  Topic Date Due  . Flu Shot  03/15/2019*  . Pneumonia vaccines (1 of 2 - PCV13) 11/25/2019*  . Colon Cancer Screening  10/08/2019  . Tetanus Vaccine  05/15/2025  . DEXA scan (bone density measurement)  Completed  *Topic was postponed. The date shown is not the original due date.   Health Maintenance, Female Adopting a healthy lifestyle and getting preventive care can go a long way to promote health and wellness. Talk with your health care provider about what schedule of regular examinations is right for you. This is a good chance for you to check in with your provider about disease prevention and staying healthy. In between checkups, there are plenty of things you can do on your own. Experts have done a lot of research about which lifestyle changes and preventive measures are most likely to keep you healthy. Ask your health care provider for more information. Weight and diet Eat a healthy diet  Be sure to include plenty of vegetables, fruits, low-fat dairy products, and  lean protein.  Do not eat a lot of foods high in solid fats, added sugars, or salt.  Get regular exercise. This is one of the most important things you can do for your health. ? Most adults should exercise for at least 150 minutes each week. The exercise should increase your heart rate and make you sweat (moderate-intensity exercise). ? Most adults should also do strengthening exercises at least twice a week. This is in addition to the moderate-intensity exercise.  Maintain a healthy weight  Body mass index (BMI) is a measurement that can be used to identify possible weight problems. It estimates body fat based on height and weight. Your health care provider can help determine your BMI and help you achieve or maintain a healthy weight.  For females 36 years of age and older: ? A BMI below 18.5 is considered underweight. ? A BMI of 18.5 to 24.9 is normal. ? A BMI of 25 to 29.9 is considered overweight. ? A BMI of 30 and above is considered obese.  Watch levels of cholesterol and blood lipids  You should start having your blood tested for lipids and cholesterol at 75 years of age, then have this test every 5 years.  You may need to have your cholesterol levels checked more often if: ? Your lipid or cholesterol levels are high. ? You are older than 75 years of age. ? You are at high risk for heart disease.  Cancer screening Lung  Cancer  Lung cancer screening is recommended for adults 93-50 years old who are at high risk for lung cancer because of a history of smoking.  A yearly low-dose CT scan of the lungs is recommended for people who: ? Currently smoke. ? Have quit within the past 15 years. ? Have at least a 30-pack-year history of smoking. A pack year is smoking an average of one pack of cigarettes a day for 1 year.  Yearly screening should continue until it has been 15 years since you quit.  Yearly screening should stop if you develop a health problem that would prevent you  from having lung cancer treatment.  Breast Cancer  Practice breast self-awareness. This means understanding how your breasts normally appear and feel.  It also means doing regular breast self-exams. Let your health care provider know about any changes, no matter how small.  If you are in your 20s or 30s, you should have a clinical breast exam (CBE) by a health care provider every 1-3 years as part of a regular health exam.  If you are 73 or older, have a CBE every year. Also consider having a breast X-ray (mammogram) every year.  If you have a family history of breast cancer, talk to your health care provider about genetic screening.  If you are at high risk for breast cancer, talk to your health care provider about having an MRI and a mammogram every year.  Breast cancer gene (BRCA) assessment is recommended for women who have family members with BRCA-related cancers. BRCA-related cancers include: ? Breast. ? Ovarian. ? Tubal. ? Peritoneal cancers.  Results of the assessment will determine the need for genetic counseling and BRCA1 and BRCA2 testing.  Cervical Cancer Your health care provider may recommend that you be screened regularly for cancer of the pelvic organs (ovaries, uterus, and vagina). This screening involves a pelvic examination, including checking for microscopic changes to the surface of your cervix (Pap test). You may be encouraged to have this screening done every 3 years, beginning at age 65.  For women ages 65-65, health care providers may recommend pelvic exams and Pap testing every 3 years, or they may recommend the Pap and pelvic exam, combined with testing for human papilloma virus (HPV), every 5 years. Some types of HPV increase your risk of cervical cancer. Testing for HPV may also be done on women of any age with unclear Pap test results.  Other health care providers may not recommend any screening for nonpregnant women who are considered low risk for pelvic  cancer and who do not have symptoms. Ask your health care provider if a screening pelvic exam is right for you.  If you have had past treatment for cervical cancer or a condition that could lead to cancer, you need Pap tests and screening for cancer for at least 20 years after your treatment. If Pap tests have been discontinued, your risk factors (such as having a new sexual partner) need to be reassessed to determine if screening should resume. Some women have medical problems that increase the chance of getting cervical cancer. In these cases, your health care provider may recommend more frequent screening and Pap tests.  Colorectal Cancer  This type of cancer can be detected and often prevented.  Routine colorectal cancer screening usually begins at 75 years of age and continues through 75 years of age.  Your health care provider may recommend screening at an earlier age if you have risk factors for colon cancer.  Your health care provider may also recommend using home test kits to check for hidden blood in the stool.  A small camera at the end of a tube can be used to examine your colon directly (sigmoidoscopy or colonoscopy). This is done to check for the earliest forms of colorectal cancer.  Routine screening usually begins at age 21.  Direct examination of the colon should be repeated every 5-10 years through 75 years of age. However, you may need to be screened more often if early forms of precancerous polyps or small growths are found.  Skin Cancer  Check your skin from head to toe regularly.  Tell your health care provider about any new moles or changes in moles, especially if there is a change in a mole's shape or color.  Also tell your health care provider if you have a mole that is larger than the size of a pencil eraser.  Always use sunscreen. Apply sunscreen liberally and repeatedly throughout the day.  Protect yourself by wearing long sleeves, pants, a wide-brimmed hat,  and sunglasses whenever you are outside.  Heart disease, diabetes, and high blood pressure  High blood pressure causes heart disease and increases the risk of stroke. High blood pressure is more likely to develop in: ? People who have blood pressure in the high end of the normal range (130-139/85-89 mm Hg). ? People who are overweight or obese. ? People who are African American.  If you are 95-35 years of age, have your blood pressure checked every 3-5 years. If you are 54 years of age or older, have your blood pressure checked every year. You should have your blood pressure measured twice-once when you are at a hospital or clinic, and once when you are not at a hospital or clinic. Record the average of the two measurements. To check your blood pressure when you are not at a hospital or clinic, you can use: ? An automated blood pressure machine at a pharmacy. ? A home blood pressure monitor.  If you are between 62 years and 24 years old, ask your health care provider if you should take aspirin to prevent strokes.  Have regular diabetes screenings. This involves taking a blood sample to check your fasting blood sugar level. ? If you are at a normal weight and have a low risk for diabetes, have this test once every three years after 75 years of age. ? If you are overweight and have a high risk for diabetes, consider being tested at a younger age or more often. Preventing infection Hepatitis B  If you have a higher risk for hepatitis B, you should be screened for this virus. You are considered at high risk for hepatitis B if: ? You were born in a country where hepatitis B is common. Ask your health care provider which countries are considered high risk. ? Your parents were born in a high-risk country, and you have not been immunized against hepatitis B (hepatitis B vaccine). ? You have HIV or AIDS. ? You use needles to inject street drugs. ? You live with someone who has hepatitis B. ? You  have had sex with someone who has hepatitis B. ? You get hemodialysis treatment. ? You take certain medicines for conditions, including cancer, organ transplantation, and autoimmune conditions.  Hepatitis C  Blood testing is recommended for: ? Everyone born from [birth year] through 1963. ? Anyone with known risk factors for hepatitis C.  Sexually transmitted infections (STIs)  You should be screened  for sexually transmitted infections (STIs) including gonorrhea and chlamydia if: ? You are sexually active and are younger than 75 years of age. ? You are older than 75 years of age and your health care provider tells you that you are at risk for this type of infection. ? Your sexual activity has changed since you were last screened and you are at an increased risk for chlamydia or gonorrhea. Ask your health care provider if you are at risk.  If you do not have HIV, but are at risk, it may be recommended that you take a prescription medicine daily to prevent HIV infection. This is called pre-exposure prophylaxis (PrEP). You are considered at risk if: ? You are sexually active and do not regularly use condoms or know the HIV status of your partner(s). ? You take drugs by injection. ? You are sexually active with a partner who has HIV.  Talk with your health care provider about whether you are at high risk of being infected with HIV. If you choose to begin PrEP, you should first be tested for HIV. You should then be tested every 3 months for as long as you are taking PrEP. Pregnancy  If you are premenopausal and you may become pregnant, ask your health care provider about preconception counseling.  If you may become pregnant, take 400 to 800 micrograms (mcg) of folic acid every day.  If you want to prevent pregnancy, talk to your health care provider about birth control (contraception). Osteoporosis and menopause  Osteoporosis is a disease in which the bones lose minerals and strength with aging.  This can result in serious bone fractures. Your risk for osteoporosis can be identified using a bone density scan.  If you are 45 years of age or older, or if you are at risk for osteoporosis and fractures, ask your health care provider if you should be screened.  Ask your health care provider whether you should take a calcium or vitamin D supplement to lower your risk for osteoporosis.  Menopause may have certain physical symptoms and risks.  Hormone replacement therapy may reduce some of these symptoms and risks. Talk to your health care provider about whether hormone replacement therapy is right for you. Follow these instructions at home:  Schedule regular health, dental, and eye exams.  Stay current with your immunizations.  Do not use any tobacco products including cigarettes, chewing tobacco, or electronic cigarettes.  If you are pregnant, do not drink alcohol.  If you are breastfeeding, limit how much and how often you drink alcohol.  Limit alcohol intake to no more than 1 drink per day for nonpregnant women. One drink equals 12 ounces of beer, 5 ounces of Augustina Braddock, or 1 ounces of hard liquor.  Do not use street drugs.  Do not share needles.  Ask your health care provider for help if you need support or information about quitting drugs.  Tell your health care provider if you often feel depressed.  Tell your health care provider if you have ever been abused or do not feel safe at home. This information is not intended to replace advice given to you by your health care provider. Make sure you discuss any questions you have with your health care provider. Document Released: 06/15/2011 Document Revised: 05/07/2016 Document Reviewed: 09/03/2015 Elsevier Interactive Patient Education  [employer].

## 2018-11-25 NOTE — Progress Notes (Signed)
Medical screening examination/treatment/procedure(s) were performed by the Wellness Coach, RN. As primary care provider I was immediately available for consulation/collaboration. I agree with above documentation. Maelyn Berrey, NP  

## 2018-12-27 ENCOUNTER — Other Ambulatory Visit: Payer: Self-pay | Admitting: Nurse Practitioner

## 2018-12-27 DIAGNOSIS — Z1231 Encounter for screening mammogram for malignant neoplasm of breast: Secondary | ICD-10-CM

## 2019-01-23 ENCOUNTER — Other Ambulatory Visit: Payer: Self-pay | Admitting: Nurse Practitioner

## 2019-01-23 DIAGNOSIS — I10 Essential (primary) hypertension: Secondary | ICD-10-CM

## 2019-01-26 ENCOUNTER — Ambulatory Visit
Admission: RE | Admit: 2019-01-26 | Discharge: 2019-01-26 | Disposition: A | Discharge status: Home / Self Care | Payer: Medicare Other | Source: Ambulatory Visit | Attending: Nurse Practitioner | Admitting: Nurse Practitioner

## 2019-01-26 DIAGNOSIS — Z1231 Encounter for screening mammogram for malignant neoplasm of breast: Secondary | ICD-10-CM | POA: Diagnosis not present

## 2019-02-23 ENCOUNTER — Ambulatory Visit (INDEPENDENT_AMBULATORY_CARE_PROVIDER_SITE_OTHER): Payer: Medicare Other | Admitting: Nurse Practitioner

## 2019-02-23 ENCOUNTER — Other Ambulatory Visit (INDEPENDENT_AMBULATORY_CARE_PROVIDER_SITE_OTHER): Payer: Medicare Other

## 2019-02-23 ENCOUNTER — Other Ambulatory Visit: Payer: Self-pay

## 2019-02-23 ENCOUNTER — Encounter: Payer: Self-pay | Admitting: Nurse Practitioner

## 2019-02-23 VITALS — BP 160/70 | HR 82 | Ht 62.0 in | Wt 151.0 lb

## 2019-02-23 DIAGNOSIS — E785 Hyperlipidemia, unspecified: Secondary | ICD-10-CM | POA: Diagnosis not present

## 2019-02-23 DIAGNOSIS — I10 Essential (primary) hypertension: Secondary | ICD-10-CM | POA: Diagnosis not present

## 2019-02-23 LAB — COMPREHENSIVE METABOLIC PANEL
ALBUMIN: 4.5 g/dL (ref 3.5–5.2)
ALT: 33 U/L (ref 0–35)
AST: 39 U/L — ABNORMAL HIGH (ref 0–37)
Alkaline Phosphatase: 56 U/L (ref 39–117)
BUN: 18 mg/dL (ref 6–23)
CO2: 28 mEq/L (ref 19–32)
Calcium: 10.6 mg/dL — ABNORMAL HIGH (ref 8.4–10.5)
Chloride: 102 mEq/L (ref 96–112)
Creatinine, Ser: 1.1 mg/dL (ref 0.40–1.20)
GFR: 58.46 mL/min — ABNORMAL LOW (ref >60.00)
Glucose, Bld: 98 mg/dL (ref 70–99)
Potassium: 4.7 mEq/L (ref 3.5–5.1)
Sodium: 137 mEq/L (ref 135–145)
Total Bilirubin: 0.6 mg/dL (ref 0.2–1.2)
Total Protein: 7.3 g/dL (ref 6.0–8.3)

## 2019-02-23 LAB — CBC
HEMATOCRIT: 44.2 % (ref 36.0–46.0)
Hemoglobin: 15.1 g/dL — ABNORMAL HIGH (ref 12.0–15.0)
MCHC: 34.3 g/dL (ref 30.0–36.0)
MCV: 90.8 fl (ref 78.0–100.0)
Platelets: 218 10*3/uL (ref 150.0–400.0)
RBC: 4.86 Mil/uL (ref 3.87–5.11)
RDW: 13.5 % (ref 11.5–15.5)
WBC: 6.7 10*3/uL (ref 4.0–10.5)

## 2019-02-23 LAB — LIPID PANEL
Cholesterol: 163 mg/dL (ref 0–200)
HDL: 34.7 mg/dL — AB (ref >39.00)
LDL Cholesterol: 100 mg/dL — ABNORMAL HIGH (ref 0–99)
NonHDL: 128.61
Total CHOL/HDL Ratio: 5
Triglycerides: 142 mg/dL (ref 0.0–149.0)
VLDL: 28.4 mg/dL (ref 0.0–40.0)

## 2019-02-23 MED ORDER — AMLODIPINE BESYLATE 10 MG PO TABS: 10.0000 mg | ORAL_TABLET | Freq: Every day | ORAL | 1 refills | Status: DC

## 2019-02-23 MED ORDER — METOPROLOL SUCCINATE ER 50 MG PO TB24: ORAL_TABLET | ORAL | 1 refills | Status: DC

## 2019-02-23 NOTE — Patient Instructions (Signed)
Head downstairs for labs  Return in about 3-4 months for routine follow up   Hypertension Hypertension, commonly called high blood pressure, is when the force of blood pumping through the arteries is too strong. The arteries are the blood vessels that carry blood from the heart throughout the body. Hypertension forces the heart to work harder to pump blood and may cause arteries to become narrow or stiff. Having untreated or uncontrolled hypertension can cause heart attacks, strokes, kidney disease, and other problems. A blood pressure reading consists of a higher number over a lower number. Ideally, your blood pressure should be below 120/80. The first ("top") number is called the systolic pressure. It is a measure of the pressure in your arteries as your heart beats. The second ("bottom") number is called the diastolic pressure. It is a measure of the pressure in your arteries as the heart relaxes. What are the causes? The cause of this condition is not known. What increases the risk? Some risk factors for high blood pressure are under your control. Others are not. Factors you can change  Smoking.  Having type 2 diabetes mellitus, high cholesterol, or both.  Not getting enough exercise or physical activity.  Being overweight.  Having too much fat, sugar, calories, or salt (sodium) in your diet.  Drinking too much alcohol. Factors that are difficult or impossible to change  Having chronic kidney disease.  Having a family history of high blood pressure.  Age. Risk increases with age.  Race. You may be at higher risk if you are African-American.  Gender. Men are at higher risk than women before age 19. After age 2, women are at higher risk than men.  Having obstructive sleep apnea.  Stress. What are the signs or symptoms? Extremely high blood pressure (hypertensive crisis) may cause:  Headache.  Anxiety.  Shortness of breath.  Nosebleed.  Nausea and  vomiting.  Severe chest pain.  Jerky movements you cannot control (seizures). How is this diagnosed? This condition is diagnosed by measuring your blood pressure while you are seated, with your arm resting on a surface. The cuff of the blood pressure monitor will be placed directly against the skin of your upper arm at the level of your heart. It should be measured at least twice using the same arm. Certain conditions can cause a difference in blood pressure between your right and left arms. Certain factors can cause blood pressure readings to be lower or higher than normal (elevated) for a short period of time:  When your blood pressure is higher when you are in a health care provider's office than when you are at home, this is called white coat hypertension. Most people with this condition do not need medicines.  When your blood pressure is higher at home than when you are in a health care provider's office, this is called masked hypertension. Most people with this condition may need medicines to control blood pressure. If you have a high blood pressure reading during one visit or you have normal blood pressure with other risk factors:  You may be asked to return on a different day to have your blood pressure checked again.  You may be asked to monitor your blood pressure at home for 1 week or longer. If you are diagnosed with hypertension, you may have other blood or imaging tests to help your health care provider understand your overall risk for other conditions. How is this treated? This condition is treated by making healthy lifestyle changes,  such as eating healthy foods, exercising more, and reducing your alcohol intake. Your health care provider may prescribe medicine if lifestyle changes are not enough to get your blood pressure under control, and if:  Your systolic blood pressure is above 130.  Your diastolic blood pressure is above 80. Your personal target blood pressure may vary  depending on your medical conditions, your age, and other factors. Follow these instructions at home: Eating and drinking   Eat a diet that is high in fiber and potassium, and low in sodium, added sugar, and fat. An example eating plan is called the DASH (Dietary Approaches to Stop Hypertension) diet. To eat this way: ? Eat plenty of fresh fruits and vegetables. Try to fill half of your plate at each meal with fruits and vegetables. ? Eat whole grains, such as whole wheat pasta, brown rice, or whole grain bread. Fill about one quarter of your plate with whole grains. ? Eat or drink low-fat dairy products, such as skim milk or low-fat yogurt. ? Avoid fatty cuts of meat, processed or cured meats, and poultry with skin. Fill about one quarter of your plate with lean proteins, such as fish, chicken without skin, beans, eggs, and tofu. ? Avoid premade and processed foods. These tend to be higher in sodium, added sugar, and fat.  Reduce your daily sodium intake. Most people with hypertension should eat less than 1,500 mg of sodium a day.  Limit alcohol intake to no more than 1 drink a day for nonpregnant women and 2 drinks a day for men. One drink equals 12 oz of beer, 5 oz of wine, or 1 oz of hard liquor. Lifestyle   Work with your health care provider to maintain a healthy body weight or to lose weight. Ask what an ideal weight is for you.  Get at least 30 minutes of exercise that causes your heart to beat faster (aerobic exercise) most days of the week. Activities may include walking, swimming, or biking.  Include exercise to strengthen your muscles (resistance exercise), such as pilates or lifting weights, as part of your weekly exercise routine. Try to do these types of exercises for 30 minutes at least 3 days a week.  Do not use any products that contain nicotine or tobacco, such as cigarettes and e-cigarettes. If you need help quitting, ask your health care provider.  Monitor your blood  pressure at home as told by your health care provider.  Keep all follow-up visits as told by your health care provider. This is important. Medicines  Take over-the-counter and prescription medicines only as told by your health care provider. Follow directions carefully. Blood pressure medicines must be taken as prescribed.  Do not skip doses of blood pressure medicine. Doing this puts you at risk for problems and can make the medicine less effective.  Ask your health care provider about side effects or reactions to medicines that you should watch for. Contact a health care provider if:  You think you are having a reaction to a medicine you are taking.  You have headaches that keep coming back (recurring).  You feel dizzy.  You have swelling in your ankles.  You have trouble with your vision. Get help right away if:  You develop a severe headache or confusion.  You have unusual weakness or numbness.  You feel faint.  You have severe pain in your chest or abdomen.  You vomit repeatedly.  You have trouble breathing. Summary  Hypertension is when the force  of blood pumping through your arteries is too strong. If this condition is not controlled, it may put you at risk for serious complications.  Your personal target blood pressure may vary depending on your medical conditions, your age, and other factors. For most people, a normal blood pressure is less than 120/80.  Hypertension is treated with lifestyle changes, medicines, or a combination of both. Lifestyle changes include weight loss, eating a healthy, low-sodium diet, exercising more, and limiting alcohol. This information is not intended to replace advice given to you by your health care provider. Make sure you discuss any questions you have with your health care provider. Document Released: 11/30/2005 Document Revised: 10/28/2016 Document Reviewed: 10/28/2016 Elsevier Interactive Patient Education  2019 [employer].

## 2019-02-23 NOTE — Assessment & Plan Note (Signed)
BP readings are elevated in clinic but normal at home Continue current medications Continue to monitor home readings RTC in 3-4 months for routine follow up, or sooner for readings >140/90 at home - amLODipine (NORVASC) 10 MG tablet; Take 1 tablet (10 mg total) by mouth daily.  Dispense: 90 tablet; Refill: 1 - metoprolol succinate (TOPROL-XL) 50 MG 24 hr tablet; TAKE 1 TABLET BY MOUTH ONCE DAILY WITH OR IMMEDIATELY FOLLOWING A MEAL  Dispense: 90 tablet; Refill: 1 - CBC; Future - Comprehensive metabolic panel; Future - Lipid panel; Future

## 2019-02-23 NOTE — Assessment & Plan Note (Signed)
Not on statin On OTC supplements Update labs She is fasting - CBC; Future - Comprehensive metabolic panel; Future - Lipid panel; Future

## 2019-02-23 NOTE — Progress Notes (Signed)
Cassandra Dyer is a 78 y.o. female with the following history as recorded in Epic[employer]:  Patient Active Problem List   Diagnosis Date Noted  . Osteoporosis 02/20/2018  . Essential hypertension 02/15/2017  . Dyslipidemia 02/15/2017  . Renal cell carcinoma of right kidney ([place]) 02/15/2017  . Renal mass 01/13/2017    Current Outpatient Medications  Medication Sig Dispense Refill  . amLODipine (NORVASC) 10 MG tablet Take 1 tablet (10 mg total) by mouth daily. 90 tablet 1  . Ascorbic Acid (VITAMIN C PO) Take by mouth.    . Calcium Carbonate-Vit D-Min (CALCIUM 1200 PO) Take by mouth.    . Cholecalciferol (VITAMIN D PO) Take by mouth.    . Garlic 5035 MG CAPS Take by mouth.    . metoprolol succinate (TOPROL-XL) 50 MG 24 hr tablet TAKE 1 TABLET BY MOUTH ONCE DAILY WITH OR IMMEDIATELY FOLLOWING A MEAL 90 tablet 1  . NON FORMULARY Mega Juice, Female balance vitamins    . Omega-3 Fatty Acids (FISH OIL PO) Take 1 tablet by mouth daily.     No current facility-administered medications for this visit.    Facility-Administered Medications Ordered in Other Visits  Medication Dose Route Frequency Provider Last Rate Last Dose  . magnesium citrate solution 1 Bottle  1 Bottle Oral Once Alexis Frock, MD        Allergies: Patient has no known allergies.  Past Medical History:  Diagnosis Date  . Anemia   . Hepatitis    hx of hep C - 10-20 years ago   . History of blood transfusion   . Hyperlipidemia   . Hypertension    not taken b/p med in 2-3 years md deceased never went to another md     Past Surgical History:  Procedure Laterality Date  . ROBOT ASSISTED LAPAROSCOPIC NEPHRECTOMY Right 01/13/2017   Procedure: XI ROBOTIC ASSISTED LAPAROSCOPIC RADICAL NEPHRECTOMY WITH LYSIS OF ADHESION;  Surgeon: Alexis Frock, MD;  Location: [facility];  Service: Urology;  Laterality: Right;    Family History  Problem Relation Age of Onset  . Hypertension Mother   . Pancreatic cancer Father   . Breast cancer  Sister     Social History   Tobacco Use  . Smoking status: Former Smoker    Packs/day: 0.50    Years: 30.00    Pack years: 15.00    Types: Cigarettes    Last attempt to quit: 12/14/2000    Years since quitting: 18.2  . Smokeless tobacco: Never Used  Substance Use Topics  . Alcohol use: Yes    Comment: occasional wine      Subjective:  Cassandra Dyer is here today for routine hypertension follow up, Annual labs. She is maintained on amlodipine 10, metoprolol 50 daily Reports daily medication compliance without noted adverse medication effects. Reports checking BP readings regularly at home, recent readings 120s-130s/70s, says she feels nervous about her medical appointments and thinks that's why her blood pressure is up here She tries to stay healthy by walking every morning She feels well today without any complaints Denies weakness, headaches, vision changes, chest pain, shortness of breath, edema.  BP Readings from Last 3 Encounters:  02/23/19 (!) 160/70  11/24/18 (!) 146/64  08/25/18 (!) 174/88    ROS- See HPI  Objective:  Vitals:   02/23/19 1602 02/23/19 1622  BP: (!) 180/78 (!) 160/70  Pulse: 82   SpO2: 95%   Weight: 151 lb (68.5 kg)   Height: 5\' 2"  (1.575 m)  General: Well developed, well nourished, in no acute distress  Skin : Warm and dry.  Head: Normocephalic and atraumatic  Eyes: Sclera and conjunctiva clear; pupils round and reactive to light; extraocular movements intact  Oropharynx: Pink, supple. No suspicious lesions  Neck: Supple without thyromegaly, adenopathy  Lungs: Respirations unlabored; clear to auscultation bilaterally without wheeze, rales, rhonchi  CVS exam: normal rate, regular rhythm, normal S1, S2, no murmurs, rubs, clicks or gallops.  Extremities: No edema, cyanosis, clubbing  Vessels: Symmetric bilaterally  Neurologic: Alert and oriented; speech intact; face symmetrical; moves all extremities well; CNII-XII intact without focal deficit   Psychiatric: Normal mood and affect.  Assessment:  1. Dyslipidemia   2. Essential hypertension     Plan:   Return in about 3 months (around 05/26/2019) for routine follow up.

## 2019-02-27 ENCOUNTER — Other Ambulatory Visit: Payer: Self-pay | Admitting: Family

## 2019-03-01 ENCOUNTER — Telehealth: Payer: Self-pay | Admitting: Nurse Practitioner

## 2019-03-01 ENCOUNTER — Other Ambulatory Visit: Payer: Self-pay | Admitting: Nurse Practitioner

## 2019-03-01 DIAGNOSIS — E785 Hyperlipidemia, unspecified: Secondary | ICD-10-CM

## 2019-03-01 MED ORDER — ROSUVASTATIN CALCIUM 10 MG PO TABS: 10.0000 mg | ORAL_TABLET | Freq: Every day | ORAL | 1 refills | Status: DC

## 2019-03-01 NOTE — Telephone Encounter (Signed)
Copied from [place] [id number]. Topic: Quick Communication - See Telephone Encounter >> Mar 01, 2019 11:55 AM Sheran Luz wrote: CRM for notification. See Telephone encounter for: 03/01/19.  Patient called back to inquire if [facility]nut had sent in a medication for her cholesterol as she had previously spoke with nurse who advised her it would be sent to pharmacy. Please advise?

## 2019-03-01 NOTE — Telephone Encounter (Signed)
See result note sent today Thanks!

## 2019-03-01 NOTE — Addendum Note (Signed)
Addended by: Cresenciano Lick on: 03/01/2019 01:29 PM   Modules accepted: Orders

## 2019-03-01 NOTE — Telephone Encounter (Signed)
Notes recorded by Cresenciano Lick, CMA on 02/27/2019 at 4:54 PM EDT Pt informed of below. She is willing to try a statin and will repeat labs in 2-3 weeks. ------  Notes recorded by Marrian Salvage, [place] on 02/27/2019 at 2:39 PM EDT Based on her lipid panel and risk factors, would recommend a statin; she has an almost 15% risk of having a cardiac event in the next 10 years; we recommend treatment when the risk is at 7.5% or higher; let [name] know her response; Also her calcium level was up slightly; not sure if this is clinically significant; but would recommend re-checking this and a hormone level in 2-3 weeks; can just come to the lab for tests.

## 2019-03-02 NOTE — Telephone Encounter (Signed)
I called pt and advised her to take Crestor once daily and verified with her when to come back for Calcium and PTH labs on around 03/10/19 and to come back to lab fasting on around 04/12/19. Pt verbalized understanding.

## 2019-03-02 NOTE — Telephone Encounter (Signed)
Pt states she is calling to receive instructions on how to take her medication and to discuss coming back in in April for lab work. Please advise.

## 2019-03-10 ENCOUNTER — Other Ambulatory Visit (INDEPENDENT_AMBULATORY_CARE_PROVIDER_SITE_OTHER): Payer: Medicare Other

## 2019-03-10 DIAGNOSIS — E785 Hyperlipidemia, unspecified: Secondary | ICD-10-CM

## 2019-03-10 LAB — LIPID PANEL
CHOL/HDL RATIO: 3
Cholesterol: 116 mg/dL (ref 0–200)
HDL: 38.8 mg/dL — ABNORMAL LOW (ref >39.00)
LDL Cholesterol: 63 mg/dL (ref 0–99)
NonHDL: 76.99
Triglycerides: 70 mg/dL (ref 0.0–149.0)
VLDL: 14 mg/dL (ref 0.0–40.0)

## 2019-03-10 LAB — COMPREHENSIVE METABOLIC PANEL
ALK PHOS: 50 U/L (ref 39–117)
ALT: 40 U/L — AB (ref 0–35)
AST: 39 U/L — ABNORMAL HIGH (ref 0–37)
Albumin: 4.2 g/dL (ref 3.5–5.2)
BUN: 14 mg/dL (ref 6–23)
CO2: 25 mEq/L (ref 19–32)
Calcium: 10 mg/dL (ref 8.4–10.5)
Chloride: 107 mEq/L (ref 96–112)
Creatinine, Ser: 1.05 mg/dL (ref 0.40–1.20)
GFR: 61.68 mL/min (ref >60.00)
Glucose, Bld: 118 mg/dL — ABNORMAL HIGH (ref 70–99)
Potassium: 4.3 mEq/L (ref 3.5–5.1)
Sodium: 141 mEq/L (ref 135–145)
TOTAL PROTEIN: 6.9 g/dL (ref 6.0–8.3)
Total Bilirubin: 0.4 mg/dL (ref 0.2–1.2)

## 2019-04-20 ENCOUNTER — Telehealth: Payer: Self-pay | Admitting: Nurse Practitioner

## 2019-04-20 DIAGNOSIS — E785 Hyperlipidemia, unspecified: Secondary | ICD-10-CM

## 2019-04-20 MED ORDER — ROSUVASTATIN CALCIUM 10 MG PO TABS: 10.0000 mg | ORAL_TABLET | Freq: Every day | ORAL | 1 refills | Status: DC

## 2019-04-20 NOTE — Telephone Encounter (Signed)
Copied from [place] [id number]. Topic: Quick Communication - Rx Refill/Question >> Apr 20, 2019  9:53 AM Percell Belt A wrote: Medication: rosuvastatin (CRESTOR) 10 MG tablet [[id number]]   Has the patient contacted their pharmacy? Yes.   (Agent: If no, request that the patient contact the pharmacy for the refill.) (Agent: If yes, when and what did the pharmacy advise?)  Preferred Pharmacy (with phone number or street name): [place], [place] - [address] [phone] (Phone)   Agent: Please be advised that RX refills may take up to 3 business days. We ask that you follow-up with your pharmacy.

## 2019-04-20 NOTE — Telephone Encounter (Signed)
Refill sent. See meds.  

## 2019-04-25 ENCOUNTER — Telehealth: Payer: Self-pay | Admitting: Internal Medicine

## 2019-04-25 NOTE — Telephone Encounter (Signed)
LVM for patient to call back to make an appt with Dr. Jenny Reichmann and to cancel [facility] appt

## 2019-04-25 NOTE — Telephone Encounter (Signed)
-----   Message from Biagio Borg, MD sent at 04/15/2019  6:50 PM EDT ----- Regarding: transfer of care Ms Pasty Arch or other [place],  Pt is due for yearly f/u and DM on or after June 18; ok to change her July appt to June with me    in order for the patient to continue have updated ongoing care without being lost in the system, and to keep up a high standard of care at [facility].  This visit would be for "office visit" to f/u general medical conditions (such as Diabetics who are due for f/u A1c or other chronic medical problem).     These visits can be In Person (if no fever symptoms), or Virtual.  I would prefer in person during day hours at the office, but either is ok.  Exceptions can be made to scheduling of currently ill patients in person who happen to have a current issue such as feeling feverish and UTI symptoms or other infection (just not anything related to possible COVID19 symptoms)  Please be aware I would like 4 D[place] (virtual) visits if possible each Mon/Tues/Wed/Thur evenings that I can do from home.  The template has been changed to reflect the ability to do this, and I will just need to be aware to do this by watching my schedule.

## 2019-06-22 ENCOUNTER — Other Ambulatory Visit: Payer: Self-pay | Admitting: Internal Medicine

## 2019-06-22 DIAGNOSIS — E785 Hyperlipidemia, unspecified: Secondary | ICD-10-CM

## 2019-06-29 ENCOUNTER — Encounter: Payer: Self-pay | Admitting: Internal Medicine

## 2019-06-29 ENCOUNTER — Other Ambulatory Visit: Payer: Self-pay

## 2019-06-29 ENCOUNTER — Ambulatory Visit (INDEPENDENT_AMBULATORY_CARE_PROVIDER_SITE_OTHER): Payer: Medicare Other | Admitting: Internal Medicine

## 2019-06-29 ENCOUNTER — Other Ambulatory Visit (INDEPENDENT_AMBULATORY_CARE_PROVIDER_SITE_OTHER): Payer: Medicare Other

## 2019-06-29 VITALS — BP 144/76 | HR 86 | Temp 98.9°F | Ht 62.0 in | Wt 156.0 lb

## 2019-06-29 DIAGNOSIS — E611 Iron deficiency: Secondary | ICD-10-CM

## 2019-06-29 DIAGNOSIS — E785 Hyperlipidemia, unspecified: Secondary | ICD-10-CM

## 2019-06-29 DIAGNOSIS — E559 Vitamin D deficiency, unspecified: Secondary | ICD-10-CM

## 2019-06-29 DIAGNOSIS — R739 Hyperglycemia, unspecified: Secondary | ICD-10-CM

## 2019-06-29 DIAGNOSIS — E538 Deficiency of other specified B group vitamins: Secondary | ICD-10-CM

## 2019-06-29 DIAGNOSIS — I10 Essential (primary) hypertension: Secondary | ICD-10-CM

## 2019-06-29 LAB — CBC WITH DIFFERENTIAL/PLATELET
Basophils Absolute: 0 10*3/uL (ref 0.0–0.1)
Basophils Relative: 0.6 % (ref 0.0–3.0)
Eosinophils Absolute: 0.2 10*3/uL (ref 0.0–0.7)
Eosinophils Relative: 2.3 % (ref 0.0–5.0)
HCT: 44.1 % (ref 36.0–46.0)
Hemoglobin: 14.6 g/dL (ref 12.0–15.0)
Lymphocytes Relative: 12.2 % (ref 12.0–46.0)
Lymphs Abs: 0.8 10*3/uL (ref 0.7–4.0)
MCHC: 33.1 g/dL (ref 30.0–36.0)
MCV: 91.9 fl (ref 78.0–100.0)
Monocytes Absolute: 1.1 10*3/uL — ABNORMAL HIGH (ref 0.1–1.0)
Monocytes Relative: 16.8 % — ABNORMAL HIGH (ref 3.0–12.0)
Neutro Abs: 4.6 10*3/uL (ref 1.4–7.7)
Neutrophils Relative %: 68.1 % (ref 43.0–77.0)
Platelets: 215 10*3/uL (ref 150.0–400.0)
RBC: 4.8 Mil/uL (ref 3.87–5.11)
RDW: 13.3 % (ref 11.5–15.5)
WBC: 6.8 10*3/uL (ref 4.0–10.5)

## 2019-06-29 LAB — HEMOGLOBIN A1C: Hgb A1c MFr Bld: 5.8 % (ref 4.6–6.5)

## 2019-06-29 LAB — VITAMIN B12: Vitamin B-12: 514 pg/mL (ref 211–911)

## 2019-06-29 LAB — TSH: TSH: 0.89 u[IU]/mL (ref 0.35–4.50)

## 2019-06-29 NOTE — Progress Notes (Signed)
Subjective:    Patient ID: Cassandra Dyer, female    DOB: [birth year], 78 y.o.   MRN: [id number]  HPI  Here to f/u; overall doing ok,  Pt denies chest pain, increasing sob or doe, wheezing, orthopnea, PND, increased LE swelling, palpitations, dizziness or syncope.  Pt denies new neurological symptoms such as new headache, or facial or extremity weakness or numbness.  Pt denies polydipsia, polyuria, or low sugar episode.  Pt states overall good compliance with meds, mostly trying to follow appropriate diet, with wt overall stable,  but little exercise however. BP at home < 140/90. No new complaints Past Medical History:  Diagnosis Date  . Anemia   . Hepatitis    hx of hep C - 10-20 years ago   . History of blood transfusion   . Hyperlipidemia   . Hypertension    not taken b/p med in 2-3 years md deceased never went to another md    Past Surgical History:  Procedure Laterality Date  . ROBOT ASSISTED LAPAROSCOPIC NEPHRECTOMY Right 01/13/2017   Procedure: XI ROBOTIC ASSISTED LAPAROSCOPIC RADICAL NEPHRECTOMY WITH LYSIS OF ADHESION;  Surgeon: Alexis Frock, MD;  Location: [facility];  Service: Urology;  Laterality: Right;    reports that she quit smoking about 18 years ago. Her smoking use included cigarettes. She has a 15.00 pack-year smoking history. She has never used smokeless tobacco. She reports current alcohol use. She reports that she does not use drugs. family history includes Breast cancer in her sister; Hypertension in her mother; Pancreatic cancer in her father. No Known Allergies Current Outpatient Medications on File Prior to Visit  Medication Sig Dispense Refill  . amLODipine (NORVASC) 10 MG tablet Take 1 tablet (10 mg total) by mouth daily. 90 tablet 1  . Ascorbic Acid (VITAMIN C PO) Take by mouth.    . Calcium Carbonate-Vit D-Min (CALCIUM 1200 PO) Take by mouth.    . Cholecalciferol (VITAMIN D PO) Take by mouth.    . Garlic 0263 MG CAPS Take by mouth.    . metoprolol succinate  (TOPROL-XL) 50 MG 24 hr tablet TAKE 1 TABLET BY MOUTH ONCE DAILY WITH OR IMMEDIATELY FOLLOWING A MEAL 90 tablet 1  . NON FORMULARY Mega Juice, Female balance vitamins    . Omega-3 Fatty Acids (FISH OIL PO) Take 1 tablet by mouth daily.    . rosuvastatin (CRESTOR) 10 MG tablet Take 1 tablet by mouth once daily 30 tablet 0   Current Facility-Administered Medications on File Prior to Visit  Medication Dose Route Frequency Provider Last Rate Last Dose  . magnesium citrate solution 1 Bottle  1 Bottle Oral Once Alexis Frock, MD       Review of Systems  Constitutional: Negative for other unusual diaphoresis or sweats HENT: Negative for ear discharge or swelling Eyes: Negative for other worsening visual disturbances Respiratory: Negative for stridor or other swelling  Gastrointestinal: Negative for worsening distension or other blood Genitourinary: Negative for retention or other urinary change Musculoskeletal: Negative for other MSK pain or swelling Skin: Negative for color change or other new lesions Neurological: Negative for worsening tremors and other numbness  Psychiatric/Behavioral: Negative for worsening agitation or other fatigue All other system neg per pt    Objective:   Physical Exam BP (!) 144/76 (BP Location: Left Arm, Patient Position: Sitting, Cuff Size: Normal)   Pulse 86   Temp 98.9 F (37.2 C) (Oral)   Ht 5\' 2"  (1.575 m)   Wt 156 lb (70.8 kg)  SpO2 96%   BMI 28.53 kg/m  VS noted,  Constitutional: Pt appears in NAD HENT: Head: NCAT.  Right Ear: External ear normal.  Left Ear: External ear normal.  Eyes: . Pupils are equal, round, and reactive to light. Conjunctivae and EOM are normal Nose: without d/c or deformity Neck: Neck supple. Gross normal ROM Cardiovascular: Normal rate and regular rhythm.   Pulmonary/Chest: Effort normal and breath sounds without rales or wheezing.  Abd:  Soft, NT, ND, + BS, no organomegaly Neurological: Pt is alert. At baseline  orientation, motor grossly intact Skin: Skin is warm. No rashes, other new lesions, no LE edema Psychiatric: Pt behavior is normal without agitation  No other exam findings  Assessment & Plan:

## 2019-06-29 NOTE — Assessment & Plan Note (Signed)
stable overall by history and exam, recent data reviewed with pt, and pt to continue medical treatment as before,  to f/u any worsening symptoms or concerns  

## 2019-06-29 NOTE — Patient Instructions (Signed)

## 2019-06-30 LAB — BASIC METABOLIC PANEL
BUN: 17 mg/dL (ref 6–23)
CO2: 25 mEq/L (ref 19–32)
Calcium: 10.2 mg/dL (ref 8.4–10.5)
Chloride: 103 mEq/L (ref 96–112)
Creatinine, Ser: 1.17 mg/dL (ref 0.40–1.20)
GFR: 54.4 mL/min — ABNORMAL LOW (ref >60.00)
Glucose, Bld: 85 mg/dL (ref 70–99)
Potassium: 4.3 mEq/L (ref 3.5–5.1)
Sodium: 139 mEq/L (ref 135–145)

## 2019-06-30 LAB — LIPID PANEL
Cholesterol: 112 mg/dL (ref 0–200)
HDL: 32.3 mg/dL — ABNORMAL LOW (ref >39.00)
LDL Cholesterol: 45 mg/dL (ref 0–99)
NonHDL: 79.84
Total CHOL/HDL Ratio: 3
Triglycerides: 172 mg/dL — ABNORMAL HIGH (ref 0.0–149.0)
VLDL: 34.4 mg/dL (ref 0.0–40.0)

## 2019-06-30 LAB — HEPATIC FUNCTION PANEL
ALT: 48 U/L — ABNORMAL HIGH (ref 0–35)
AST: 49 U/L — ABNORMAL HIGH (ref 0–37)
Albumin: 4.6 g/dL (ref 3.5–5.2)
Alkaline Phosphatase: 58 U/L (ref 39–117)
Bilirubin, Direct: 0 mg/dL (ref 0.0–0.3)
Total Bilirubin: 0.3 mg/dL (ref 0.2–1.2)
Total Protein: 7.4 g/dL (ref 6.0–8.3)

## 2019-06-30 LAB — IBC PANEL
Iron: 81 ug/dL (ref 42–145)
Saturation Ratios: 18.7 % — ABNORMAL LOW (ref 20.0–50.0)
Transferrin: 310 mg/dL (ref 212.0–360.0)

## 2019-06-30 LAB — VITAMIN D 25 HYDROXY (VIT D DEFICIENCY, FRACTURES): VITD: 45.06 ng/mL (ref 30.00–100.00)

## 2019-07-01 ENCOUNTER — Encounter: Payer: Self-pay | Admitting: Internal Medicine

## 2019-07-05 ENCOUNTER — Ambulatory Visit: Payer: Medicare Other | Admitting: Nurse Practitioner

## 2019-07-18 ENCOUNTER — Telehealth: Payer: Self-pay | Admitting: Nurse Practitioner

## 2019-07-18 DIAGNOSIS — E785 Hyperlipidemia, unspecified: Secondary | ICD-10-CM

## 2019-07-18 MED ORDER — ROSUVASTATIN CALCIUM 10 MG PO TABS: 10.0000 mg | ORAL_TABLET | Freq: Every day | ORAL | 1 refills | Status: DC

## 2019-07-18 NOTE — Telephone Encounter (Signed)
rosuvastatin (CRESTOR) 10 MG tablet  Send to [facility]   Pt want 90 day supply. Please advise

## 2019-07-18 NOTE — Telephone Encounter (Signed)
Reviewed chart pt is up-to-date sent refills to [facility].../lmb  

## 2019-10-10 DIAGNOSIS — H25013 Cortical age-related cataract, bilateral: Secondary | ICD-10-CM | POA: Diagnosis not present

## 2019-10-10 DIAGNOSIS — H2512 Age-related nuclear cataract, left eye: Secondary | ICD-10-CM | POA: Diagnosis not present

## 2019-10-10 DIAGNOSIS — H2513 Age-related nuclear cataract, bilateral: Secondary | ICD-10-CM | POA: Diagnosis not present

## 2019-10-10 DIAGNOSIS — H18413 Arcus senilis, bilateral: Secondary | ICD-10-CM | POA: Diagnosis not present

## 2019-10-10 DIAGNOSIS — H25043 Posterior subcapsular polar age-related cataract, bilateral: Secondary | ICD-10-CM | POA: Diagnosis not present

## 2019-10-11 ENCOUNTER — Other Ambulatory Visit: Payer: Self-pay | Admitting: *Deleted

## 2019-10-11 DIAGNOSIS — I10 Essential (primary) hypertension: Secondary | ICD-10-CM

## 2019-10-11 MED ORDER — METOPROLOL SUCCINATE ER 50 MG PO TB24: ORAL_TABLET | ORAL | 1 refills | Status: DC

## 2019-10-11 MED ORDER — AMLODIPINE BESYLATE 10 MG PO TABS: 10.0000 mg | ORAL_TABLET | Freq: Every day | ORAL | 1 refills | Status: DC

## 2019-10-18 ENCOUNTER — Other Ambulatory Visit: Payer: Self-pay | Admitting: Internal Medicine

## 2019-10-18 DIAGNOSIS — E785 Hyperlipidemia, unspecified: Secondary | ICD-10-CM

## 2019-10-23 DIAGNOSIS — H2512 Age-related nuclear cataract, left eye: Secondary | ICD-10-CM | POA: Diagnosis not present

## 2019-10-24 DIAGNOSIS — H2511 Age-related nuclear cataract, right eye: Secondary | ICD-10-CM | POA: Diagnosis not present

## 2019-11-08 ENCOUNTER — Other Ambulatory Visit: Payer: Self-pay

## 2019-11-13 DIAGNOSIS — H2511 Age-related nuclear cataract, right eye: Secondary | ICD-10-CM | POA: Diagnosis not present

## 2019-11-29 ENCOUNTER — Ambulatory Visit (INDEPENDENT_AMBULATORY_CARE_PROVIDER_SITE_OTHER): Payer: Medicare Other | Admitting: *Deleted

## 2019-11-29 DIAGNOSIS — Z0000 Encounter for general adult medical examination without abnormal findings: Secondary | ICD-10-CM | POA: Diagnosis not present

## 2019-11-29 NOTE — Progress Notes (Addendum)
Subjective:   Cassandra Dyer is a 78 y.o. female who presents for Medicare Annual (Subsequent) preventive examination.  I connected with patient by a telephone and verified that I am speaking with the correct person using two identifiers. Patient stated full name and DOB. Patient gave permission to continue with telephonic visit. Patient's location was at home and Nurse's location was at [place] office. Participants during this visit included patient and nurse.  Review of Systems:     Sleep patterns: feels rested on waking, gets up 1-2 times nightly to void and sleeps 7-8 hours nightly.    Home Safety/Smoke Alarms: Feels safe in home. Smoke alarms in place.  Living environment; residence and Firearm Safety: 1-story house/ trailer. Lives with significant other, no needs for DME, good support system Seat Belt Safety/Bike Helmet: Wears seat belt.      Objective:     Vitals: There were no vitals taken for this visit.  There is no height or weight on file to calculate BMI.  Advanced Directives 11/29/2019 11/24/2018 11/11/2017 01/13/2017 01/12/2017 06/13/2016  Does Patient Have a Medical Advance Directive? No No No No No No  Would patient like information on creating a medical advance directive? Yes (ED - Information included in AVS) No - Patient declined Yes (ED - Information included in AVS) No - Patient declined - No - patient declined information    Tobacco Social History   Tobacco Use  Smoking Status Former Smoker  . Packs/day: 0.50  . Years: 30.00  . Pack years: 15.00  . Types: Cigarettes  . Quit date: 12/14/2000  . Years since quitting: 18.9  Smokeless Tobacco Never Used     Counseling given: Not Answered  Past Medical History:  Diagnosis Date  . Anemia   . Hepatitis    hx of hep C - 10-20 years ago   . History of blood transfusion   . Hyperlipidemia   . Hypertension    not taken b/p med in 2-3 years md deceased never went to another md    Past Surgical History:    Procedure Laterality Date  . CATARACT EXTRACTION Bilateral    11/30 left /12/9 right  . ROBOT ASSISTED LAPAROSCOPIC NEPHRECTOMY Right 01/13/2017   Procedure: XI ROBOTIC ASSISTED LAPAROSCOPIC RADICAL NEPHRECTOMY WITH LYSIS OF ADHESION;  Surgeon: Alexis Frock, MD;  Location: [facility];  Service: Urology;  Laterality: Right;   Family History  Problem Relation Age of Onset  . Hypertension Mother   . Pancreatic cancer Father   . Breast cancer Sister    Social History   Socioeconomic History  . Marital status: Significant Other    Spouse name: Not on file  . Number of children: 0  . Years of education: 9  . Highest education level: Not on file  Occupational History  . Occupation: Retired  Tobacco Use  . Smoking status: Former Smoker    Packs/day: 0.50    Years: 30.00    Pack years: 15.00    Types: Cigarettes    Quit date: 12/14/2000    Years since quitting: 18.9  . Smokeless tobacco: Never Used  Substance and Sexual Activity  . Alcohol use: Yes    Comment: occasional Cassandra Dyer   . Drug use: No  . Sexual activity: Never  Other Topics Concern  . Not on file  Social History Narrative   Fun/Hobby: Watching TV and crossword puzzles   Social Determinants of Health   Financial Resource Strain:   . Difficulty of Paying Living  Expenses: Not on file  Food Insecurity:   . Worried About Charity fundraiser in the Last Year: Not on file  . Ran Out of Food in the Last Year: Not on file  Transportation Needs:   . Lack of Transportation (Medical): Not on file  . Lack of Transportation (Non-Medical): Not on file  Physical Activity: Insufficiently Active  . Days of Exercise per Week: Not on file  . Minutes of Exercise per Session: Not on file  Stress: No Stress Concern Present  . Feeling of Stress : Not on file  Social Connections: Unknown  . Frequency of Communication with Friends and Family: Not on file  . Frequency of Social Gatherings with Friends and Family: Not on file  . Attends  Religious Services: Not on file  . Active Member of Clubs or Organizations: Not on file  . Attends Archivist Meetings: Not on file  . Marital Status: Not on file    Outpatient Encounter Medications as of 11/29/2019  Medication Sig  . amLODipine (NORVASC) 10 MG tablet Take 1 tablet (10 mg total) by mouth daily.  . Ascorbic Acid (VITAMIN C PO) Take by mouth.  . Calcium Carbonate-Vit D-Min (CALCIUM 1200 PO) Take by mouth.  . Cholecalciferol (VITAMIN D PO) Take by mouth.  . Garlic 2831 MG CAPS Take by mouth.  . metoprolol succinate (TOPROL-XL) 50 MG 24 hr tablet TAKE 1 TABLET BY MOUTH ONCE DAILY WITH OR IMMEDIATELY FOLLOWING A MEAL  . NON FORMULARY Mega Juice, Female balance vitamins  . Omega-3 Fatty Acids (FISH OIL PO) Take 1 tablet by mouth daily.  . rosuvastatin (CRESTOR) 10 MG tablet Take 1 tablet by mouth once daily   Facility-Administered Encounter Medications as of 11/29/2019  Medication  . magnesium citrate solution 1 Bottle    Activities of Daily Living In your present state of health, do you have any difficulty performing the following activities: 11/29/2019  Hearing? N  Vision? N  Difficulty concentrating or making decisions? N  Walking or climbing stairs? N  Dressing or bathing? N  Doing errands, shopping? N  Preparing Food and eating ? N  Using the Toilet? N  In the past six months, have you accidently leaked urine? N  Do you have problems with loss of bowel control? N  Managing your Medications? N  Managing your Finances? N  Housekeeping or managing your Housekeeping? N  Some recent data might be hidden    Patient Care Team: Biagio Borg, MD as PCP - General (Internal Medicine)    Assessment:   This is a routine wellness examination for Cassandra Dyer. Physical assessment deferred to PCP.  Exercise Activities and Dietary recommendations Current Exercise Habits: Home exercise routine, Type of exercise: walking, Time (Minutes): 30, Frequency (Times/Week):  7, Weekly Exercise (Minutes/Week): 210, Intensity: Mild, Exercise limited by: orthopedic condition(s)  Diet (meal preparation, eat out, water intake, caffeinated beverages, dairy products, fruits and vegetables): in general, a "healthy" diet  , well balanced. eats a variety of fruits and vegetables daily, limits salt, fat/cholesterol, sugar,carbohydrates,caffeine, drinks 6-8 glasses of water daily.  Goals    . Patient Stated     Maintain current health status, continue to help individuals within the community, stay active in church, enjoy life and family    . Patient Stated     Continue to enjoy family, friends, stay socially and spiritually connected.       Fall Risk Fall Risk  11/29/2019 06/29/2019 11/24/2018 11/11/2017 02/15/2017  Falls in  the past year? 0 0 0 No No  Number falls in past yr: 0 - - - -  Injury with Fall? 0 - - - -    Depression Screen PHQ 2/9 Scores 11/29/2019 06/29/2019 11/24/2018 11/11/2017  PHQ - 2 Score 0 0 0 0  PHQ- 9 Score - - - 0     Cognitive Function MMSE - Mini Mental State Exam 11/24/2018 11/11/2017  Orientation to time 5 5  Orientation to Place 5 5  Registration 3 3  Attention/ Calculation 5 5  Recall 2 2  Language- name 2 objects 2 2  Language- repeat 1 1  Language- follow 3 step command 3 3  Language- read & follow direction 1 1  Write a sentence 1 1  Copy design 1 1  Total score 29 29       Ad8 score reviewed for issues:  Issues making decisions: no  Less interest in hobbies / activities: no  Repeats questions, stories (family complaining): no  Trouble using ordinary gadgets (microwave, computer, phone):no  Forgets the month or year: no  Mismanaging finances: no  Remembering appts: no  Daily problems with thinking and/or memory: no Ad8 score is= 0  Immunization History  Administered Date(s) Administered  . Tdap 05/16/2015   Screening Tests Health Maintenance  Topic Date Due  . INFLUENZA VACCINE  03/13/2020 (Originally  07/15/2019)  . PNA vac Low Risk Adult (1 of 2 - PCV13) 11/28/2020 (Originally 05/06/2008)  . TETANUS/TDAP  05/15/2025  . DEXA SCAN  Completed      Plan:    Reviewed health maintenance screenings with patient today and relevant education, vaccines, and/or referrals were provided.   Continue to eat heart healthy diet (full of fruits, vegetables, whole grains, lean protein, water--limit salt, fat, and sugar intake) and increase physical activity as tolerated.  Continue doing brain stimulating activities (puzzles, reading, adult coloring books, staying active) to keep memory sharp.   I have personally reviewed and noted the following in the patient's chart:   . Medical and social history . Use of alcohol, tobacco or illicit drugs  . Current medications and supplements . Functional ability and status . Nutritional status . Physical activity . Advanced directives . List of other physicians . Screenings to include cognitive, depression, and falls . Referrals and appointments  In addition, I have reviewed and discussed with patient certain preventive protocols, quality metrics, and best practice recommendations. A written personalized care plan for preventive services as well as general preventive health recommendations were provided to patient.     Michiel Cowboy, RN  11/29/2019    Medical screening examination/treatment/procedure(s) were performed by non-physician practitioner and as supervising physician I was immediately available for consultation/collaboration. I agree with above. Cathlean Cower, MD

## 2019-11-30 ENCOUNTER — Ambulatory Visit: Payer: Medicare Other

## 2019-12-28 ENCOUNTER — Other Ambulatory Visit: Payer: Self-pay

## 2019-12-28 ENCOUNTER — Encounter: Payer: Self-pay | Admitting: Internal Medicine

## 2019-12-28 ENCOUNTER — Ambulatory Visit (INDEPENDENT_AMBULATORY_CARE_PROVIDER_SITE_OTHER): Payer: Medicare Other | Admitting: Internal Medicine

## 2019-12-28 VITALS — BP 164/84 | HR 90 | Temp 98.8°F | Ht 62.0 in | Wt 157.0 lb

## 2019-12-28 DIAGNOSIS — E785 Hyperlipidemia, unspecified: Secondary | ICD-10-CM

## 2019-12-28 DIAGNOSIS — R739 Hyperglycemia, unspecified: Secondary | ICD-10-CM | POA: Diagnosis not present

## 2019-12-28 DIAGNOSIS — I10 Essential (primary) hypertension: Secondary | ICD-10-CM | POA: Diagnosis not present

## 2019-12-28 DIAGNOSIS — I872 Venous insufficiency (chronic) (peripheral): Secondary | ICD-10-CM | POA: Diagnosis not present

## 2019-12-28 MED ORDER — HYDROCHLOROTHIAZIDE 12.5 MG PO CAPS: 12.5000 mg | ORAL_CAPSULE | Freq: Every day | ORAL | 3 refills | Status: DC

## 2019-12-28 MED ORDER — METOPROLOL SUCCINATE ER 100 MG PO TB24: 100.0000 mg | ORAL_TABLET | Freq: Every day | ORAL | 3 refills | Status: DC

## 2019-12-28 NOTE — Assessment & Plan Note (Signed)
Mild, also add hct 12.5 qd

## 2019-12-28 NOTE — Assessment & Plan Note (Signed)
stable overall by history and exam, recent data reviewed with pt, and pt to continue medical treatment as before,  to f/u any worsening symptoms or concerns  

## 2019-12-28 NOTE — Patient Instructions (Addendum)
Please take all new medication as prescribed - the mild fluid pill  OK to increase the toprol XL to 100 mg per day  Please continue all other medications as before, including the amlodipine and crestor  Please have the pharmacy call with any other refills you may need.  Please continue your efforts at being more active, low cholesterol diet, and weight control.  Please keep your appointments with your specialists as you may have planned  Please return in 6 months, or sooner if needed

## 2019-12-28 NOTE — Progress Notes (Signed)
Subjective:    Patient ID: Cassandra Dyer, female    DOB: 10-24-1941, 79 y.o.   MRN: [id number]  HPI  Here to f/u; overall doing ok,  Pt denies chest pain, increasing sob or doe, wheezing, orthopnea, PND, increased LE swelling, palpitations, dizziness or syncope though does have some small swelling to the legs gone in the AM, but back in the PM  Pt denies new neurological symptoms such as new headache, or facial or extremity weakness or numbness.  Pt denies polydipsia, polyuria, or low sugar episode.  Pt states overall good compliance with meds, mostly trying to follow appropriate diet, with wt overall stable,  but little exercise however BP Readings from Last 3 Encounters:  12/28/19 (!) 164/84  06/29/19 (!) 144/76  02/23/19 (!) 160/70   Past Medical History:  Diagnosis Date  . Anemia   . Hepatitis    hx of hep C - 10-20 years ago   . History of blood transfusion   . Hyperlipidemia   . Hypertension    not taken b/p med in 2-3 years md deceased never went to another md    Past Surgical History:  Procedure Laterality Date  . CATARACT EXTRACTION Bilateral    11/30 left /12/9 right  . ROBOT ASSISTED LAPAROSCOPIC NEPHRECTOMY Right 01/13/2017   Procedure: XI ROBOTIC ASSISTED LAPAROSCOPIC RADICAL NEPHRECTOMY WITH LYSIS OF ADHESION;  Surgeon: Alexis Frock, MD;  Location: [facility];  Service: Urology;  Laterality: Right;    reports that she quit smoking about 19 years ago. Her smoking use included cigarettes. She has a 15.00 pack-year smoking history. She has never used smokeless tobacco. She reports current alcohol use. She reports that she does not use drugs. family history includes Breast cancer in her sister; Hypertension in her mother; Pancreatic cancer in her father. No Known Allergies Current Outpatient Medications on File Prior to Visit  Medication Sig Dispense Refill  . amLODipine (NORVASC) 10 MG tablet Take 1 tablet (10 mg total) by mouth daily. 90 tablet 1  . Ascorbic Acid  (VITAMIN C PO) Take by mouth.    . Calcium Carbonate-Vit D-Min (CALCIUM 1200 PO) Take by mouth.    . Cholecalciferol (VITAMIN D PO) Take by mouth.    . Garlic 7616 MG CAPS Take by mouth.    . NON FORMULARY Mega Juice, Female balance vitamins    . Omega-3 Fatty Acids (FISH OIL PO) Take 1 tablet by mouth daily.    . rosuvastatin (CRESTOR) 10 MG tablet Take 1 tablet by mouth once daily 90 tablet 0   No current facility-administered medications on file prior to visit.   Review of Systems  Constitutional: Negative for other unusual diaphoresis or sweats HENT: Negative for ear discharge or swelling Eyes: Negative for other worsening visual disturbances Respiratory: Negative for stridor or other swelling  Gastrointestinal: Negative for worsening distension or other blood Genitourinary: Negative for retention or other urinary change Musculoskeletal: Negative for other MSK pain or swelling Skin: Negative for color change or other new lesions Neurological: Negative for worsening tremors and other numbness  Psychiatric/Behavioral: Negative for worsening agitation or other fatigue All otherwise neg per pt     Objective:   Physical Exam BP (!) 164/84   Pulse 90   Temp 98.8 F (37.1 C) (Oral)   Ht 5\' 2"  (1.575 m)   Wt 157 lb (71.2 kg)   SpO2 97%   BMI 28.72 kg/m  VS noted,  Constitutional: Pt appears in NAD HENT: Head: NCAT.  Right Ear: External ear normal.  Left Ear: External ear normal.  Eyes: . Pupils are equal, round, and reactive to light. Conjunctivae and EOM are normal Nose: without d/c or deformity Neck: Neck supple. Gross normal ROM Cardiovascular: Normal rate and regular rhythm.   Pulmonary/Chest: Effort normal and breath sounds without rales or wheezing.  Abd:  Soft, NT, ND, + BS, no organomegaly Neurological: Pt is alert. At baseline orientation, motor grossly intact Skin: Skin is warm. No rashes, other new lesions, trace bilat LE edema Psychiatric: Pt behavior is normal  without agitation  All otherwise neg per pt Lab Results  Component Value Date   WBC 6.8 06/29/2019   HGB 14.6 06/29/2019   HCT 44.1 06/29/2019   PLT 215.0 06/29/2019   GLUCOSE 85 06/29/2019   CHOL 112 06/29/2019   TRIG 172.0 (H) 06/29/2019   HDL 32.30 (L) 06/29/2019   LDLCALC 45 06/29/2019   ALT 48 (H) 06/29/2019   AST 49 (H) 06/29/2019   NA 139 06/29/2019   K 4.3 06/29/2019   CL 103 06/29/2019   CREATININE 1.17 06/29/2019   BUN 17 06/29/2019   CO2 25 06/29/2019   TSH 0.89 06/29/2019   INR 1.0 08/03/2009   HGBA1C 5.8 06/29/2019         Assessment & Plan:

## 2019-12-28 NOTE — Assessment & Plan Note (Addendum)
Uncontrolled o/w stable overall by history and exam, recent data reviewed with pt, and pt to continue medical treatment as before,  to f/u any worsening symptoms or concerns, to increase the toprol xl 100 qd

## 2020-01-11 ENCOUNTER — Other Ambulatory Visit: Payer: Self-pay | Admitting: Internal Medicine

## 2020-01-11 DIAGNOSIS — Z1231 Encounter for screening mammogram for malignant neoplasm of breast: Secondary | ICD-10-CM

## 2020-01-14 ENCOUNTER — Other Ambulatory Visit: Payer: Self-pay | Admitting: Internal Medicine

## 2020-01-14 DIAGNOSIS — E785 Hyperlipidemia, unspecified: Secondary | ICD-10-CM

## 2020-02-20 ENCOUNTER — Ambulatory Visit
Admission: RE | Admit: 2020-02-20 | Discharge: 2020-02-20 | Disposition: A | Discharge status: Home / Self Care | Payer: Medicare Other | Source: Ambulatory Visit | Attending: Internal Medicine | Admitting: Internal Medicine

## 2020-02-20 ENCOUNTER — Other Ambulatory Visit: Payer: Self-pay

## 2020-02-20 DIAGNOSIS — Z1231 Encounter for screening mammogram for malignant neoplasm of breast: Secondary | ICD-10-CM

## 2020-02-22 ENCOUNTER — Other Ambulatory Visit: Payer: Self-pay | Admitting: Internal Medicine

## 2020-02-22 DIAGNOSIS — R928 Other abnormal and inconclusive findings on diagnostic imaging of breast: Secondary | ICD-10-CM

## 2020-03-06 ENCOUNTER — Other Ambulatory Visit: Payer: Self-pay

## 2020-03-06 ENCOUNTER — Other Ambulatory Visit: Payer: Self-pay | Admitting: Internal Medicine

## 2020-03-06 ENCOUNTER — Ambulatory Visit
Admission: RE | Admit: 2020-03-06 | Discharge: 2020-03-06 | Disposition: A | Discharge status: Home / Self Care | Payer: Medicare Other | Source: Ambulatory Visit | Attending: Internal Medicine | Admitting: Internal Medicine

## 2020-03-06 DIAGNOSIS — R921 Mammographic calcification found on diagnostic imaging of breast: Secondary | ICD-10-CM

## 2020-03-06 DIAGNOSIS — R928 Other abnormal and inconclusive findings on diagnostic imaging of breast: Secondary | ICD-10-CM

## 2020-03-14 ENCOUNTER — Ambulatory Visit
Admission: RE | Admit: 2020-03-14 | Discharge: 2020-03-14 | Disposition: A | Discharge status: Home / Self Care | Payer: Medicare Other | Source: Ambulatory Visit | Attending: Internal Medicine | Admitting: Internal Medicine

## 2020-03-14 ENCOUNTER — Other Ambulatory Visit: Payer: Self-pay

## 2020-03-14 DIAGNOSIS — N6011 Diffuse cystic mastopathy of right breast: Secondary | ICD-10-CM | POA: Diagnosis not present

## 2020-03-14 DIAGNOSIS — D241 Benign neoplasm of right breast: Secondary | ICD-10-CM | POA: Diagnosis not present

## 2020-03-14 DIAGNOSIS — R921 Mammographic calcification found on diagnostic imaging of breast: Secondary | ICD-10-CM

## 2020-03-25 ENCOUNTER — Other Ambulatory Visit: Payer: Self-pay | Admitting: Internal Medicine

## 2020-03-25 DIAGNOSIS — I10 Essential (primary) hypertension: Secondary | ICD-10-CM

## 2020-03-25 NOTE — Telephone Encounter (Signed)
Please refill as per office routine med refill policy (all routine meds refilled for 3 mo or monthly per pt preference up to one year from last visit, then month to month grace period for 3 mo, then further med refills will have to be denied)  

## 2020-04-08 ENCOUNTER — Emergency Department (HOSPITAL_COMMUNITY): Payer: Medicare Other

## 2020-04-08 ENCOUNTER — Other Ambulatory Visit: Payer: Self-pay

## 2020-04-08 ENCOUNTER — Inpatient Hospital Stay (HOSPITAL_COMMUNITY)
Admission: EM | Admit: 2020-04-08 | Discharge: 2020-04-11 | DRG: 871 | Disposition: A | Discharge status: Home / Self Care | Payer: Medicare Other | Attending: Internal Medicine | Admitting: Internal Medicine

## 2020-04-08 DIAGNOSIS — Z85528 Personal history of other malignant neoplasm of kidney: Secondary | ICD-10-CM

## 2020-04-08 DIAGNOSIS — Z20822 Contact with and (suspected) exposure to covid-19: Secondary | ICD-10-CM | POA: Diagnosis present

## 2020-04-08 DIAGNOSIS — K5730 Diverticulosis of large intestine without perforation or abscess without bleeding: Secondary | ICD-10-CM | POA: Diagnosis not present

## 2020-04-08 DIAGNOSIS — Z8249 Family history of ischemic heart disease and other diseases of the circulatory system: Secondary | ICD-10-CM | POA: Diagnosis not present

## 2020-04-08 DIAGNOSIS — R05 Cough: Secondary | ICD-10-CM | POA: Diagnosis not present

## 2020-04-08 DIAGNOSIS — C641 Malignant neoplasm of right kidney, except renal pelvis: Secondary | ICD-10-CM | POA: Diagnosis not present

## 2020-04-08 DIAGNOSIS — R6520 Severe sepsis without septic shock: Secondary | ICD-10-CM | POA: Diagnosis not present

## 2020-04-08 DIAGNOSIS — D696 Thrombocytopenia, unspecified: Secondary | ICD-10-CM | POA: Diagnosis present

## 2020-04-08 DIAGNOSIS — I10 Essential (primary) hypertension: Secondary | ICD-10-CM | POA: Diagnosis present

## 2020-04-08 DIAGNOSIS — N179 Acute kidney failure, unspecified: Secondary | ICD-10-CM | POA: Diagnosis present

## 2020-04-08 DIAGNOSIS — Z905 Acquired absence of kidney: Secondary | ICD-10-CM | POA: Diagnosis not present

## 2020-04-08 DIAGNOSIS — J9621 Acute and chronic respiratory failure with hypoxia: Secondary | ICD-10-CM | POA: Diagnosis present

## 2020-04-08 DIAGNOSIS — M810 Age-related osteoporosis without current pathological fracture: Secondary | ICD-10-CM | POA: Diagnosis present

## 2020-04-08 DIAGNOSIS — Z79899 Other long term (current) drug therapy: Secondary | ICD-10-CM | POA: Diagnosis not present

## 2020-04-08 DIAGNOSIS — R739 Hyperglycemia, unspecified: Secondary | ICD-10-CM | POA: Diagnosis present

## 2020-04-08 DIAGNOSIS — I160 Hypertensive urgency: Secondary | ICD-10-CM | POA: Diagnosis present

## 2020-04-08 DIAGNOSIS — E860 Dehydration: Secondary | ICD-10-CM

## 2020-04-08 DIAGNOSIS — Z91128 Patient's intentional underdosing of medication regimen for other reason: Secondary | ICD-10-CM

## 2020-04-08 DIAGNOSIS — A419 Sepsis, unspecified organism: Secondary | ICD-10-CM | POA: Diagnosis not present

## 2020-04-08 DIAGNOSIS — T465X6A Underdosing of other antihypertensive drugs, initial encounter: Secondary | ICD-10-CM | POA: Diagnosis present

## 2020-04-08 DIAGNOSIS — Z87891 Personal history of nicotine dependence: Secondary | ICD-10-CM | POA: Diagnosis not present

## 2020-04-08 DIAGNOSIS — Y92009 Unspecified place in unspecified non-institutional (private) residence as the place of occurrence of the external cause: Secondary | ICD-10-CM

## 2020-04-08 DIAGNOSIS — J189 Pneumonia, unspecified organism: Secondary | ICD-10-CM

## 2020-04-08 DIAGNOSIS — B1920 Unspecified viral hepatitis C without hepatic coma: Secondary | ICD-10-CM | POA: Diagnosis present

## 2020-04-08 DIAGNOSIS — R748 Abnormal levels of other serum enzymes: Secondary | ICD-10-CM | POA: Diagnosis present

## 2020-04-08 DIAGNOSIS — N170 Acute kidney failure with tubular necrosis: Secondary | ICD-10-CM | POA: Diagnosis present

## 2020-04-08 DIAGNOSIS — Z8619 Personal history of other infectious and parasitic diseases: Secondary | ICD-10-CM

## 2020-04-08 DIAGNOSIS — J440 Chronic obstructive pulmonary disease with acute lower respiratory infection: Secondary | ICD-10-CM | POA: Diagnosis present

## 2020-04-08 DIAGNOSIS — Z9289 Personal history of other medical treatment: Secondary | ICD-10-CM

## 2020-04-08 DIAGNOSIS — R918 Other nonspecific abnormal finding of lung field: Secondary | ICD-10-CM | POA: Diagnosis not present

## 2020-04-08 DIAGNOSIS — E785 Hyperlipidemia, unspecified: Secondary | ICD-10-CM | POA: Diagnosis not present

## 2020-04-08 DIAGNOSIS — R7989 Other specified abnormal findings of blood chemistry: Secondary | ICD-10-CM | POA: Diagnosis not present

## 2020-04-08 DIAGNOSIS — R112 Nausea with vomiting, unspecified: Secondary | ICD-10-CM | POA: Diagnosis present

## 2020-04-08 DIAGNOSIS — Z9114 Patient's other noncompliance with medication regimen: Secondary | ICD-10-CM | POA: Diagnosis not present

## 2020-04-08 DIAGNOSIS — R000 Tachycardia, unspecified: Secondary | ICD-10-CM | POA: Diagnosis not present

## 2020-04-08 HISTORY — DX: Personal history of other malignant neoplasm of kidney: Z85.528

## 2020-04-08 LAB — COMPREHENSIVE METABOLIC PANEL
ALT: 95 U/L — ABNORMAL HIGH (ref 0–44)
AST: 155 U/L — ABNORMAL HIGH (ref 15–41)
Albumin: 2.6 g/dL — ABNORMAL LOW (ref 3.5–5.0)
Alkaline Phosphatase: 41 U/L (ref 38–126)
Anion gap: 13 (ref 5–15)
BUN: 52 mg/dL — ABNORMAL HIGH (ref 8–23)
CO2: 18 mmol/L — ABNORMAL LOW (ref 22–32)
Calcium: 8.5 mg/dL — ABNORMAL LOW (ref 8.9–10.3)
Chloride: 104 mmol/L (ref 98–111)
Creatinine, Ser: 3.17 mg/dL — ABNORMAL HIGH (ref 0.44–1.00)
GFR calc Af Amer: 16 mL/min — ABNORMAL LOW (ref >60)
GFR calc non Af Amer: 14 mL/min — ABNORMAL LOW (ref >60)
Glucose, Bld: 231 mg/dL — ABNORMAL HIGH (ref 70–99)
Potassium: 4.5 mmol/L (ref 3.5–5.1)
Sodium: 135 mmol/L (ref 135–145)
Total Bilirubin: 1.1 mg/dL (ref 0.3–1.2)
Total Protein: 4.9 g/dL — ABNORMAL LOW (ref 6.5–8.1)

## 2020-04-08 LAB — CBC
HCT: 51.4 % — ABNORMAL HIGH (ref 36.0–46.0)
Hemoglobin: 17.2 g/dL — ABNORMAL HIGH (ref 12.0–15.0)
MCH: 29.5 pg (ref 26.0–34.0)
MCHC: 33.5 g/dL (ref 30.0–36.0)
MCV: 88 fL (ref 80.0–100.0)
Platelets: 126 10*3/uL — ABNORMAL LOW (ref 150–400)
RBC: 5.84 MIL/uL — ABNORMAL HIGH (ref 3.87–5.11)
RDW: 14.9 % (ref 11.5–15.5)
WBC: 16.7 10*3/uL — ABNORMAL HIGH (ref 4.0–10.5)
nRBC: 0 % (ref 0.0–0.2)

## 2020-04-08 LAB — URINALYSIS, ROUTINE W REFLEX MICROSCOPIC
Bilirubin Urine: NEGATIVE
Glucose, UA: NEGATIVE mg/dL
Hgb urine dipstick: NEGATIVE
Ketones, ur: NEGATIVE mg/dL
Leukocytes,Ua: NEGATIVE
Nitrite: NEGATIVE
Protein, ur: 30 mg/dL — AB
Specific Gravity, Urine: 1.02 (ref 1.005–1.030)
pH: 5 (ref 5.0–8.0)

## 2020-04-08 LAB — CBG MONITORING, ED: Glucose-Capillary: 117 mg/dL — ABNORMAL HIGH (ref 70–99)

## 2020-04-08 LAB — LACTIC ACID, PLASMA: Lactic Acid, Venous: 1.7 mmol/L (ref 0.5–1.9)

## 2020-04-08 LAB — PROCALCITONIN: Procalcitonin: 0.11 ng/mL

## 2020-04-08 LAB — CK: Total CK: 116 U/L (ref 38–234)

## 2020-04-08 LAB — LIPASE, BLOOD: Lipase: 65 U/L — ABNORMAL HIGH (ref 11–51)

## 2020-04-08 LAB — HEMOGLOBIN A1C
Hgb A1c MFr Bld: 5.6 % (ref 4.8–5.6)
Mean Plasma Glucose: 114.02 mg/dL

## 2020-04-08 LAB — RESPIRATORY PANEL BY RT PCR (FLU A&B, COVID)
Influenza A by PCR: NEGATIVE
Influenza B by PCR: NEGATIVE
SARS Coronavirus 2 by RT PCR: NEGATIVE

## 2020-04-08 MED ORDER — SODIUM CHLORIDE 0.9 % IV SOLN: INTRAVENOUS | Status: DC

## 2020-04-08 MED ORDER — HEPARIN SODIUM (PORCINE) 5000 UNIT/ML IJ SOLN
5000.0000 [IU] | Freq: Three times a day (TID) | INTRAMUSCULAR | Status: DC
  Administered 2020-04-08 – 2020-04-09 (×4): 5000 [IU] via SUBCUTANEOUS
  Filled 2020-04-08 (×4): qty 1

## 2020-04-08 MED ORDER — GUAIFENESIN ER 600 MG PO TB12
600.0000 mg | ORAL_TABLET | Freq: Two times a day (BID) | ORAL | Status: DC
  Administered 2020-04-08 – 2020-04-11 (×7): 600 mg via ORAL
  Filled 2020-04-08 (×7): qty 1

## 2020-04-08 MED ORDER — SODIUM CHLORIDE 0.9 % IV SOLN
500.0000 mg | Freq: Once | INTRAVENOUS | Status: AC
  Administered 2020-04-08: 500 mg via INTRAVENOUS
  Filled 2020-04-08: qty 500

## 2020-04-08 MED ORDER — LACTATED RINGERS IV BOLUS
1000.0000 mL | Freq: Once | INTRAVENOUS | Status: AC
  Administered 2020-04-08: 1000 mL via INTRAVENOUS

## 2020-04-08 MED ORDER — ONDANSETRON HCL 4 MG/2ML IJ SOLN
4.0000 mg | Freq: Once | INTRAMUSCULAR | Status: AC
  Administered 2020-04-08: 4 mg via INTRAVENOUS
  Filled 2020-04-08: qty 2

## 2020-04-08 MED ORDER — SODIUM CHLORIDE 0.9 % IV SOLN
2.0000 g | Freq: Once | INTRAVENOUS | Status: AC
  Administered 2020-04-08: 2 g via INTRAVENOUS
  Filled 2020-04-08: qty 20

## 2020-04-08 MED ORDER — SODIUM CHLORIDE 0.9% FLUSH
3.0000 mL | Freq: Two times a day (BID) | INTRAVENOUS | Status: DC
  Administered 2020-04-08: 3 mL via INTRAVENOUS

## 2020-04-08 MED ORDER — ALBUTEROL SULFATE (2.5 MG/3ML) 0.083% IN NEBU: 2.5000 mg | INHALATION_SOLUTION | Freq: Four times a day (QID) | RESPIRATORY_TRACT | Status: DC | PRN

## 2020-04-08 MED ORDER — INSULIN ASPART 100 UNIT/ML ~~LOC~~ SOLN: 0.0000 [IU] | Freq: Three times a day (TID) | SUBCUTANEOUS | Status: DC

## 2020-04-08 MED ORDER — ONDANSETRON HCL 4 MG/2ML IJ SOLN: 4.0000 mg | Freq: Four times a day (QID) | INTRAMUSCULAR | Status: DC | PRN

## 2020-04-08 MED ORDER — HYDRALAZINE HCL 20 MG/ML IJ SOLN
10.0000 mg | INTRAMUSCULAR | Status: DC | PRN
  Administered 2020-04-10: 10 mg via INTRAVENOUS
  Filled 2020-04-08: qty 1

## 2020-04-08 MED ORDER — ACETAMINOPHEN 650 MG RE SUPP: 650.0000 mg | Freq: Four times a day (QID) | RECTAL | Status: DC | PRN

## 2020-04-08 MED ORDER — AMLODIPINE BESYLATE 10 MG PO TABS
10.0000 mg | ORAL_TABLET | Freq: Every day | ORAL | Status: DC
  Administered 2020-04-08 – 2020-04-11 (×4): 10 mg via ORAL
  Filled 2020-04-08: qty 2
  Filled 2020-04-08 (×3): qty 1

## 2020-04-08 MED ORDER — ONDANSETRON HCL 4 MG PO TABS: 4.0000 mg | ORAL_TABLET | Freq: Four times a day (QID) | ORAL | Status: DC | PRN

## 2020-04-08 MED ORDER — ACETAMINOPHEN 325 MG PO TABS: 650.0000 mg | ORAL_TABLET | Freq: Four times a day (QID) | ORAL | Status: DC | PRN

## 2020-04-08 MED ORDER — METOPROLOL SUCCINATE ER 100 MG PO TB24
100.0000 mg | ORAL_TABLET | Freq: Once | ORAL | Status: AC
  Administered 2020-04-08: 100 mg via ORAL
  Filled 2020-04-08: qty 1

## 2020-04-08 MED ORDER — AZITHROMYCIN 250 MG PO TABS
500.0000 mg | ORAL_TABLET | Freq: Every day | ORAL | Status: DC
  Administered 2020-04-09 – 2020-04-11 (×3): 500 mg via ORAL
  Filled 2020-04-08 (×3): qty 2

## 2020-04-08 MED ORDER — SODIUM CHLORIDE 0.9 % IV SOLN
2.0000 g | INTRAVENOUS | Status: DC
  Administered 2020-04-09 – 2020-04-11 (×3): 2 g via INTRAVENOUS
  Filled 2020-04-08 (×3): qty 2

## 2020-04-08 MED ORDER — METOPROLOL SUCCINATE ER 100 MG PO TB24
100.0000 mg | ORAL_TABLET | Freq: Every day | ORAL | Status: DC
  Administered 2020-04-09 – 2020-04-11 (×3): 100 mg via ORAL
  Filled 2020-04-08 (×3): qty 1

## 2020-04-08 NOTE — ED Notes (Signed)
Patient transported to CT 

## 2020-04-08 NOTE — ED Triage Notes (Signed)
Pt c/o vomiting since Saturday a.m. along with facial and left hand swelling.

## 2020-04-08 NOTE — ED Provider Notes (Signed)
[facility] Provider Note   CSN: [id number] Arrival date & time: 04/08/20  [id number]     History Chief Complaint  Patient presents with  . Emesis    Cassandra Dyer is a 79 y.o. female.  The history is provided by the patient.  Emesis Severity:  Severe Duration:  3 days Timing:  Intermittent Number of daily episodes:  5 or more Quality:  Stomach contents Progression:  Unchanged Chronicity:  New Relieved by:  Nothing Exacerbated by: trying to eat. Ineffective treatments:  None tried Associated symptoms: cough and diarrhea   Associated symptoms: no abdominal pain, no headaches and no URI   Associated symptoms comment:  Generalized fatigue and weakness.  Feels out of breath with activity since the vomiting started.  2 episodes of diarrhea with the vomiting yesterday but none before or since then.  No fever.  Intermittent cough but no sputum or cold like symptoms.  No abd pain or recent surgery s/p nephrectomy.  Pt still taking BP meds but not today due to vomiting.  No chest pain or pressure. Felt like her face looks puffy through her cheeks and eyes since sat.  Has intermittent swelling of her legs that is better after sleeping which has not changed. Covid vaccine 2nd dose last month. Risk factors: prior abdominal surgery   Risk factors: no alcohol use, no diabetes and no sick contacts        Past Medical History:  Diagnosis Date  . Anemia   . Hepatitis    hx of hep C - 10-20 years ago   . History of blood transfusion   . Hyperlipidemia   . Hypertension    not taken b/p med in 2-3 years md deceased never went to another md     Patient Active Problem List   Diagnosis Date Noted  . Venous insufficiency 12/28/2019  . Hyperglycemia 06/29/2019  . Osteoporosis 02/20/2018  . Essential hypertension 02/15/2017  . Dyslipidemia 02/15/2017  . Renal cell carcinoma of right kidney ([place]) 02/15/2017    Past Surgical History:  Procedure  Laterality Date  . CATARACT EXTRACTION Bilateral    11/30 left /12/9 right  . ROBOT ASSISTED LAPAROSCOPIC NEPHRECTOMY Right 01/13/2017   Procedure: XI ROBOTIC ASSISTED LAPAROSCOPIC RADICAL NEPHRECTOMY WITH LYSIS OF ADHESION;  Surgeon: Alexis Frock, MD;  Location: [facility];  Service: Urology;  Laterality: Right;     OB History   No obstetric history on file.     Family History  Problem Relation Age of Onset  . Hypertension Mother   . Pancreatic cancer Father   . Breast cancer Sister     Social History   Tobacco Use  . Smoking status: Former Smoker    Packs/day: 0.50    Years: 30.00    Pack years: 15.00    Types: Cigarettes    Quit date: 12/14/2000    Years since quitting: 19.3  . Smokeless tobacco: Never Used  Substance Use Topics  . Alcohol use: Yes    Comment: occasional wine   . Drug use: No    Home Medications Prior to Admission medications   Medication Sig Start Date End Date Taking? Authorizing Provider  amLODipine (NORVASC) 10 MG tablet Take 1 tablet (10 mg total) by mouth daily. Annual appt due in July must see provider for future refills 03/26/20   Biagio Borg, MD  Ascorbic Acid (VITAMIN C PO) Take by mouth.    [provider]  Calcium Carbonate-Vit D-Min (CALCIUM  1200 PO) Take by mouth.    [provider]  Cholecalciferol (VITAMIN D PO) Take by mouth.    [provider]  Garlic 1791 MG CAPS Take by mouth.    [provider]  hydrochlorothiazide (MICROZIDE) 12.5 MG capsule Take 1 capsule (12.5 mg total) by mouth daily. 12/28/19 12/27/20  Biagio Borg, MD  metoprolol succinate (TOPROL-XL) 100 MG 24 hr tablet Take 1 tablet (100 mg total) by mouth daily. Take with or immediately following a meal. 12/28/19   Biagio Borg, MD  NON FORMULARY Mega Juice, Female balance vitamins    [provider]  Omega-3 Fatty Acids (FISH OIL PO) Take 1 tablet by mouth daily.    [provider]  rosuvastatin (CRESTOR) 10 MG tablet  Take 1 tablet by mouth once daily 01/15/20   Biagio Borg, MD    Allergies    Patient has no known allergies.  Review of Systems   Review of Systems  Respiratory: Positive for cough.   Gastrointestinal: Positive for diarrhea and vomiting. Negative for abdominal pain.  Neurological: Negative for headaches.  All other systems reviewed and are negative.   Physical Exam Updated Vital Signs BP (!) 197/84 (BP Location: Left Arm)   Pulse (!) 114   Temp 98.2 F (36.8 C) (Oral)   Resp 16   Ht 5\' 2"  (1.575 m)   Wt 71.2 kg   SpO2 97%   BMI 28.72 kg/m   Physical Exam Vitals and nursing note reviewed.  Constitutional:      General: She is not in acute distress.    Appearance: Normal appearance. She is well-developed and normal weight.  HENT:     Head: Normocephalic and atraumatic.     Comments: No notable facial swelling    Mouth/Throat:     Mouth: Mucous membranes are dry.     Comments: No dental pain Eyes:     Conjunctiva/sclera: Conjunctivae normal.     Pupils: Pupils are equal, round, and reactive to light.  Cardiovascular:     Rate and Rhythm: Regular rhythm. Tachycardia present.     Heart sounds: No murmur.  Pulmonary:     Effort: Pulmonary effort is normal. No respiratory distress.     Breath sounds: Normal breath sounds. No wheezing or rales.  Abdominal:     General: Bowel sounds are normal. There is no distension.     Palpations: Abdomen is soft.     Tenderness: There is no abdominal tenderness. There is no guarding or rebound.  Musculoskeletal:        General: No tenderness. Normal range of motion.     Cervical back: Normal range of motion and neck supple.     Comments: Trace edema present in bilateral lower ext  Skin:    General: Skin is warm and dry.     Findings: No erythema or rash.  Neurological:     General: No focal deficit present.     Mental Status: She is alert and oriented to person, place, and time. Mental status is at baseline.  Psychiatric:         Mood and Affect: Mood normal.        Behavior: Behavior normal.        Thought Content: Thought content normal.     ED Results / Procedures / Treatments   Labs (all labs ordered are listed, but only abnormal results are displayed) Labs Reviewed  LIPASE, BLOOD - Abnormal; Notable for the following components:  Result Value   Lipase 65 (*)    All other components within normal limits  COMPREHENSIVE METABOLIC PANEL - Abnormal; Notable for the following components:   CO2 18 (*)    Glucose, Bld 231 (*)    BUN 52 (*)    Creatinine, Ser 3.17 (*)    Calcium 8.5 (*)    Total Protein 4.9 (*)    Albumin 2.6 (*)    AST 155 (*)    ALT 95 (*)    GFR calc non Af Amer 14 (*)    GFR calc Af Amer 16 (*)    All other components within normal limits  CBC - Abnormal; Notable for the following components:   WBC 16.7 (*)    RBC 5.84 (*)    Hemoglobin 17.2 (*)    HCT 51.4 (*)    Platelets 126 (*)    All other components within normal limits  URINALYSIS, ROUTINE W REFLEX MICROSCOPIC    EKG EKG Interpretation  Date/Time:  Monday April 08 2020 11:35:01 EDT Ventricular Rate:  103 PR Interval:    QRS Duration: 69 QT Interval:  314 QTC Calculation: 411 R Axis:   36 Text Interpretation: Sinus tachycardia No significant change since last tracing Confirmed by Blanchie Dessert [phone] on 04/08/2020 11:45:12 AM   Radiology CT ABDOMEN PELVIS WO CONTRAST  Result Date: 04/08/2020 CLINICAL DATA:  Vomiting. Facial and left hand swelling. EXAM: CT ABDOMEN AND PELVIS WITHOUT CONTRAST TECHNIQUE: Multidetector CT imaging of the abdomen and pelvis was performed following the standard protocol without IV contrast. COMPARISON:  08/05/2017 FINDINGS: Lower chest: Small right pleural effusion. Consolidation/atelectasis posteriorly at the right lung base. Coronary calcifications. Hepatobiliary: Stable small hypodense lesion presumably cysts. No new lesion. No gallstones, gallbladder wall thickening, or  biliary dilatation. Pancreas: Unremarkable. No pancreatic ductal dilatation or surrounding inflammatory changes. Spleen: Normal in size without focal abnormality. Adrenals/Urinary Tract: 1.5 cm left adrenal nodule as before. Left kidney unremarkable. Previous right nephrectomy. Stomach/Bowel: Stomach nondistended. Small bowel decompressed. Colon is nondilated with scattered diverticula. Vascular/Lymphatic: Moderate calcified aortoiliac atheromatous plaque without aneurysm. No retroperitoneal, abdominal, or pelvic adenopathy. Reproductive: Calcified uterine fibroids. 4 cm left cystic adnexal process, previously 3.1 cm on 06/14/2016. Smaller right adnexal cyst. Other: No ascites. No free air. Musculoskeletal: Small ventral hernia containing only mesenteric fat. Stable L3 superior endplate compression deformity. No acute fracture or worrisome bone lesion. IMPRESSION: 1. Small right pleural effusion with adjacent consolidation/atelectasis at the right lung base. 2. Stable left adrenal nodule, likely adenoma. 3. Stable 4 cm left cystic adnexal process, previously 3.1 cm on 06/14/2016. Consider elective outpatient pelvic ultrasound for further characterization. 4. Colonic diverticulosis. 5. Uterine fibroids. 6. Small ventral hernia containing only mesenteric fat. Aortic Atherosclerosis (ICD10-I70.0). Electronically Signed   By: Lucrezia Europe M.D.   On: 04/08/2020 13:36   DG Chest Port 1 View  Result Date: 04/08/2020 CLINICAL DATA:  79 year old female with cough and vomiting for 2 days. Face and left hand swelling. EXAM: PORTABLE CHEST 1 VIEW COMPARISON:  Chest radiographs 08/02/2017 and earlier. FINDINGS: Portable AP upright view at 1115 hours. Mildly lower lung volumes. Mediastinal contours are stable and within normal limits. Visualized tracheal air column is within normal limits. Allowing for portable technique the lungs are clear. No pneumothorax. Normal visible bowel gas pattern. No pneumoperitoneum identified. No  acute osseous abnormality identified. IMPRESSION: Negative portable chest. Electronically Signed   By: Genevie Ann M.D.   On: 04/08/2020 11:21    Procedures Procedures (including critical  care time)  Medications Ordered in ED Medications  ondansetron (ZOFRAN) injection 4 mg (has no administration in time range)  lactated ringers bolus 1,000 mL (has no administration in time range)    ED Course  I have reviewed the triage vital signs and the nursing notes.  Pertinent labs & imaging results that were available during my care of the patient were reviewed by me and considered in my medical decision making (see chart for details).    MDM Rules/Calculators/A&P                      79 year old female presenting today with persistent vomiting since Saturday.  Patient reports she cannot hold anything down but has no localized abdominal pain, fever.  Patient did have 2 episodes of diarrhea but have not had any further bowel movements.  No recent antibiotic use.  She has had no recent abdominal surgeries but does have a prior history of nephrectomy due to cancer.  Patient has had an intermittent cough but denies any cold-like symptoms or shortness of breath today.  Patient is tachycardic and hypertensive here.  She has not been able to take her blood pressure medicines for the last few days due to to the vomiting.  She has no significant findings for fluid overload today and breath sounds are clear bilaterally.  Labs are consistent with a leukocytosis of 16,000 and hemoconcentration with a hemoglobin of 17, CMP with new AKI with a creatinine of 3.1 from her baseline of 1 with elevated BUN concern for prerenal due to poor oral intake.  Also LFTs are elevated at 155 and 95 which is above her baseline even with a history of hepatitis for the last 10 to 20 years.  Patient has no abdominal pain on exam however her lipase is also mildly elevated in the 60s.  We will do a chest x-ray to confirm that patient does not  have an underlying pneumonia.  Low suspicion at this time for CHF.  AKI could be related to dehydration due to persistent nausea vomiting and patient will be given IV fluids and antiemetic.  If x-ray is clear we will do CT to further evaluate the abdomen.  2:09 PM  CT shows a small right pleural effusion with adjacent consolidation in the right lung base which is most likely pneumonia in the cause of patient's symptoms as she does not have any further evidence in her abdomen that would be causing her symptoms.  Given patient's pneumonia, leukocytosis, new AKI will admit for further care.  She was given Rocephin and azithromycin.  MDM Number of Diagnoses or Management Options   Amount and/or Complexity of Data Reviewed Clinical lab tests: ordered and reviewed Tests in the radiology section of CPT: ordered and reviewed Tests in the medicine section of CPT: ordered and reviewed Decide to obtain previous medical records or to obtain history from someone other than the patient: yes Obtain history from someone other than the patient: no Review and summarize past medical records: yes Discuss the patient with other providers: yes Independent visualization of images, tracings, or specimens: yes  Risk of Complications, Morbidity, and/or Mortality Presenting problems: moderate Diagnostic procedures: minimal Management options: low  Patient Progress Patient progress: improved    Final Clinical Impression(s) / ED Diagnoses Final diagnoses:  AKI (acute kidney injury) ([place])  Dehydration  Community acquired pneumonia of right lower lobe of lung    Rx / DC Orders ED Discharge Orders    None  Blanchie Dessert, MD 04/08/20 1410

## 2020-04-08 NOTE — H&P (Signed)
History and Physical    Cassandra Dyer PJK:[id number] DOB: Feb 12, 1941 DOA: 04/08/2020  Referring MD/NP/PA: Blanchie Dessert, MD PCP: Biagio Borg, MD  Patient coming from: Home  Chief Complaint: Vomiting  I have personally briefly reviewed patient's old medical records in [place]   HPI: Cassandra Dyer is a 79 y.o. female with medical history significant of HTN, HLD, remote hepatitis C, renal cell carcinoma s/p right nephrectomy in 2018, and anemia presents with complaints of vomiting over the last 3 days.  She recalls eating fish the night before onset of symptoms, but does not feel that this was related.  She had vomited several times that day and was unable to keep any of her food or medicines down.  Notes associated symptoms of decreased urine output, mild intermittent cough, some shortness of breath, swelling(legs and face), loose stools, and generalized malaise.  She has had both of her Covid vaccines.  Denies having any fever, chest pain, abdominal pain, history of heart failure, or recent sick contacts to her knowledge.  She reports that she previously had been diagnosed with hepatitis C several years ago, but never was formally treated for this.  ED Course: On admission into the emergency department patient was noted to be afebrile with pulse 100-117, respirations 12-23, blood pressure elevated up to 220/85, and O2 saturations maintained on room air.  Labs significant for WBC 16.7, hemoglobin 17.2, platelets 126, BUN 52, creatinine 3.17, glucose 231, AST 155, and ALT 95.  Influenza and COVID-19 screening were negative.  Imaging studies concerning for a right lower lobe consolidation/atelectasis with small pleural effusion.  Patient was given Zofran, 1 L lactated Ringer's, Rocephin, and azithromycin.   Review of Systems  Constitutional: Positive for malaise/fatigue. Negative for fever.  HENT: Negative for ear discharge and nosebleeds.   Eyes: Negative for photophobia and pain.    Respiratory: Positive for cough.   Cardiovascular: Positive for leg swelling.  Gastrointestinal: Positive for nausea and vomiting. Negative for abdominal pain.  Genitourinary: Negative for dysuria and hematuria.  Neurological: Positive for weakness. Negative for focal weakness.    Past Medical History:  Diagnosis Date  . Anemia   . Hepatitis    hx of hep C - 10-20 years ago   . History of blood transfusion   . Hyperlipidemia   . Hypertension    not taken b/p med in 2-3 years md deceased never went to another md     Past Surgical History:  Procedure Laterality Date  . CATARACT EXTRACTION Bilateral    11/30 left /12/9 right  . ROBOT ASSISTED LAPAROSCOPIC NEPHRECTOMY Right 01/13/2017   Procedure: XI ROBOTIC ASSISTED LAPAROSCOPIC RADICAL NEPHRECTOMY WITH LYSIS OF ADHESION;  Surgeon: Alexis Frock, MD;  Location: [facility];  Service: Urology;  Laterality: Right;     reports that she quit smoking about 19 years ago. Her smoking use included cigarettes. She has a 15.00 pack-year smoking history. She has never used smokeless tobacco. She reports current alcohol use. She reports that she does not use drugs.  No Known Allergies  Family History  Problem Relation Age of Onset  . Hypertension Mother   . Pancreatic cancer Father   . Breast cancer Sister     Prior to Admission medications   Medication Sig Start Date End Date Taking? Authorizing Provider  amLODipine (NORVASC) 10 MG tablet Take 1 tablet (10 mg total) by mouth daily. Annual appt due in July must see provider for future refills 03/26/20   Cathlean Cower  W, MD  Ascorbic Acid (VITAMIN C PO) Take by mouth.    [provider]  Calcium Carbonate-Vit D-Min (CALCIUM 1200 PO) Take by mouth.    [provider]  Cholecalciferol (VITAMIN D PO) Take by mouth.    [provider]  Garlic 3557 MG CAPS Take by mouth.    [provider]  hydrochlorothiazide (MICROZIDE) 12.5 MG capsule Take 1 capsule (12.5 mg  total) by mouth daily. 12/28/19 12/27/20  Biagio Borg, MD  metoprolol succinate (TOPROL-XL) 100 MG 24 hr tablet Take 1 tablet (100 mg total) by mouth daily. Take with or immediately following a meal. 12/28/19   Biagio Borg, MD  NON FORMULARY Mega Juice, Female balance vitamins    [provider]  Omega-3 Fatty Acids (FISH OIL PO) Take 1 tablet by mouth daily.    [provider]  rosuvastatin (CRESTOR) 10 MG tablet Take 1 tablet by mouth once daily 01/15/20   Biagio Borg, MD    Physical Exam:  Constitutional: Elderly female who appears to be in no acute distress at this time Vitals:   04/08/20 1045 04/08/20 1100 04/08/20 1130 04/08/20 1230  BP: (!) 209/87 (!) 193/88 (!) 219/84 (!) 220/85  Pulse: (!) 106 (!) 117 (!) 103 (!) 102  Resp: 16 18  17   Temp:      TempSrc:      SpO2: 96% 94% 92% 95%  Weight:      Height:       Eyes: PERRL, lids and conjunctivae normal ENMT: Mucous membranes are dry. Posterior pharynx clear of any exudate or lesions. Neck: normal, supple, no masses, no thyromegaly Respiratory: clear to auscultation bilaterally, no wheezing, no crackles. Normal respiratory effort.  Cardiovascular: Regular rate and rhythm, no murmurs / rubs / gallops.  1+ pitting edema of the lower extremities. 2+ pedal pulses. No carotid bruits.  Abdomen: no tenderness, no masses palpated. No hepatosplenomegaly. Bowel sounds positive.  Musculoskeletal: no clubbing / cyanosis. No joint deformity upper and lower extremities. Good ROM, no contractures. Normal muscle tone.  Skin: no rashes, lesions, ulcers. No induration Neurologic: CN 2-12 grossly intact. Sensation intact, DTR normal. Strength 5/5 in all 4.  Psychiatric: Normal judgment and insight. Alert and oriented x 3. Normal mood.     Labs on Admission: I have personally reviewed following labs and imaging studies  CBC: Recent Labs  Lab 04/08/20 0635  WBC 16.7*  HGB 17.2*  HCT 51.4*  MCV 88.0  PLT 126*   Basic  Metabolic Panel: Recent Labs  Lab 04/08/20 0635  NA 135  K 4.5  CL 104  CO2 18*  GLUCOSE 231*  BUN 52*  CREATININE 3.17*  CALCIUM 8.5*   GFR: Estimated Creatinine Clearance: 13.9 mL/min (A) (by C-G formula based on SCr of 3.17 mg/dL (H)). Liver Function Tests: Recent Labs  Lab 04/08/20 0635  AST 155*  ALT 95*  ALKPHOS 41  BILITOT 1.1  PROT 4.9*  ALBUMIN 2.6*   Recent Labs  Lab 04/08/20 0635  LIPASE 65*   No results for input(s): AMMONIA in the last 168 hours. Coagulation Profile: No results for input(s): INR, PROTIME in the last 168 hours. Cardiac Enzymes: No results for input(s): CKTOTAL, CKMB, CKMBINDEX, TROPONINI in the last 168 hours. BNP (last 3 results) No results for input(s): PROBNP in the last 8760 hours. HbA1C: No results for input(s): HGBA1C in the last 72 hours. CBG: No results for input(s): GLUCAP in the last 168 hours. Lipid Profile: No  results for input(s): CHOL, HDL, LDLCALC, TRIG, CHOLHDL, LDLDIRECT in the last 72 hours. Thyroid Function Tests: No results for input(s): TSH, T4TOTAL, FREET4, T3FREE, THYROIDAB in the last 72 hours. Anemia Panel: No results for input(s): VITAMINB12, FOLATE, FERRITIN, TIBC, IRON, RETICCTPCT in the last 72 hours. Urine analysis:    Component Value Date/Time   COLORURINE YELLOW 04/08/2020 1203   APPEARANCEUR HAZY (A) 04/08/2020 1203   LABSPEC 1.020 04/08/2020 1203   PHURINE 5.0 04/08/2020 1203   GLUCOSEU NEGATIVE 04/08/2020 1203   HGBUR NEGATIVE 04/08/2020 1203   Proctor 04/08/2020 1203   KETONESUR NEGATIVE 04/08/2020 1203   PROTEINUR 30 (A) 04/08/2020 1203   UROBILINOGEN 0.2 08/03/2009 1940   NITRITE NEGATIVE 04/08/2020 1203   LEUKOCYTESUR NEGATIVE 04/08/2020 1203   Sepsis Labs: No results found for this or any previous visit (from the past 240 hour(s)).   Radiological Exams on Admission: CT ABDOMEN PELVIS WO CONTRAST  Result Date: 04/08/2020 CLINICAL DATA:  Vomiting. Facial and left hand  swelling. EXAM: CT ABDOMEN AND PELVIS WITHOUT CONTRAST TECHNIQUE: Multidetector CT imaging of the abdomen and pelvis was performed following the standard protocol without IV contrast. COMPARISON:  08/05/2017 FINDINGS: Lower chest: Small right pleural effusion. Consolidation/atelectasis posteriorly at the right lung base. Coronary calcifications. Hepatobiliary: Stable small hypodense lesion presumably cysts. No new lesion. No gallstones, gallbladder wall thickening, or biliary dilatation. Pancreas: Unremarkable. No pancreatic ductal dilatation or surrounding inflammatory changes. Spleen: Normal in size without focal abnormality. Adrenals/Urinary Tract: 1.5 cm left adrenal nodule as before. Left kidney unremarkable. Previous right nephrectomy. Stomach/Bowel: Stomach nondistended. Small bowel decompressed. Colon is nondilated with scattered diverticula. Vascular/Lymphatic: Moderate calcified aortoiliac atheromatous plaque without aneurysm. No retroperitoneal, abdominal, or pelvic adenopathy. Reproductive: Calcified uterine fibroids. 4 cm left cystic adnexal process, previously 3.1 cm on 06/14/2016. Smaller right adnexal cyst. Other: No ascites. No free air. Musculoskeletal: Small ventral hernia containing only mesenteric fat. Stable L3 superior endplate compression deformity. No acute fracture or worrisome bone lesion. IMPRESSION: 1. Small right pleural effusion with adjacent consolidation/atelectasis at the right lung base. 2. Stable left adrenal nodule, likely adenoma. 3. Stable 4 cm left cystic adnexal process, previously 3.1 cm on 06/14/2016. Consider elective outpatient pelvic ultrasound for further characterization. 4. Colonic diverticulosis. 5. Uterine fibroids. 6. Small ventral hernia containing only mesenteric fat. Aortic Atherosclerosis (ICD10-I70.0). Electronically Signed   By: Lucrezia Europe M.D.   On: 04/08/2020 13:36   DG Chest Port 1 View  Result Date: 04/08/2020 CLINICAL DATA:  79 year old female with  cough and vomiting for 2 days. Face and left hand swelling. EXAM: PORTABLE CHEST 1 VIEW COMPARISON:  Chest radiographs 08/02/2017 and earlier. FINDINGS: Portable AP upright view at 1115 hours. Mildly lower lung volumes. Mediastinal contours are stable and within normal limits. Visualized tracheal air column is within normal limits. Allowing for portable technique the lungs are clear. No pneumothorax. Normal visible bowel gas pattern. No pneumoperitoneum identified. No acute osseous abnormality identified. IMPRESSION: Negative portable chest. Electronically Signed   By: Genevie Ann M.D.   On: 04/08/2020 11:21    EKG: Independently reviewed.  Sinus tachycardia 103 bpm  Assessment/Plan Sepsis secondary to right lower lobe pneumonia: Acute.  Patient presents with tachycardia and tachypnea with white blood cell count elevated up to 16.7.  Imaging studies concerning for possible right lower lobe pneumonia.  Lactic acid had not initially been obtained prior to patient receiving 1 L of fluids with empiric antibiotics of Rocephin and azithromycin.  Imaging findings could be secondary to aspiration  given multiple episodes of vomiting. -Admit to a telemetry bed  -Check blood cultures -Continuous pulse oximetry with nasal cannula oxygen as needed -Add-on lactic acid and procalcitonin -Continue empiric antibiotics of Rocephin and azithromycin -Mucinex  Acute renal failure: Patient's baseline creatinine previously noted to be within normal limits, but presents with creatinine elevated up to 3.17 with BUN 52.  Suspecting prerenal cause of symptoms with signs of hemoconcentration.  Urinalysis not significant for any signs of infection. -Add on CK -Monitor intake and output -IV fluids normal saline IV fluids at 125 mL/h -Recheck kidney function in a.m. -Hold nephrotoxic agents including hydrochlorothiazide  Hypertensive urgency: Blood pressures initially elevated up to 220.  Home blood pressure medications include  amlodipine 10 mg daily and metoprolol 100 mg daily. -Continue amlodipine and metoprolol -Hydralazine IV as needed elevated blood pressures  Nausea and vomiting: Acute.  Patient reports persistent nausea and vomiting over the last 3 days. -Antiemetics as needed  Hyperglycemia: Acute.  Glucose initially elevated up to 231 on admission.  Last hemoglobin A1c 5.8 in 06/2019.  Question if this is secondary to acute stress response or patient has untreated diabetes.   -Check hemoglobin A1c -Hypoglycemic protocol -CBGs before every meal with very sensitive SSI -Adjust insulin regimen as needed   Thrombocytopenia: Acute.  Platelet count 126 on admission.  Review of records notes patient previously with normal platelet counts.  No signs of bleeding. -Recheck platelet count in a.m.  Elevated liver enzymes: Acute.  On admission AST elevated to 155 and ALT 95. -Recheck liver enzymes in a.m.  Hyperlipidemia: Home medications include Crestor 10 mg daily. -Consider restarting depending CK  History of renal cell carcinoma of the right kidney: Patient status post right nephrectomy in 2018.  History of hepatitis C: Patient reports remote history of hepatitis C.  Reports receiving any kind of treatment.  Question if elevated liver enzymes and thrombocytopenia related. -Check acute hepatitis panel -May warrant formal consult gastroenterology in a.m.  **CT incidental findings stable left cystic adnexal process, left adrenal nodule, and small ventral hernia**  DVT prophylaxis: Heparin Code Status: Full Family Communication: Patient declined need to update family at this time Disposition Plan: Possible discharge home in 2 to 3 days Consults called: None Admission status: Inpatient  Norval Morton MD [facility] Pager [phone]   If 7PM-7AM, please contact night-coverage [email or web address] Password [facility]  04/08/2020, 2:38 PM

## 2020-04-09 ENCOUNTER — Inpatient Hospital Stay (HOSPITAL_COMMUNITY): Payer: Medicare Other

## 2020-04-09 ENCOUNTER — Encounter (HOSPITAL_COMMUNITY): Payer: Self-pay | Admitting: Internal Medicine

## 2020-04-09 DIAGNOSIS — J189 Pneumonia, unspecified organism: Secondary | ICD-10-CM | POA: Diagnosis present

## 2020-04-09 DIAGNOSIS — N170 Acute kidney failure with tubular necrosis: Secondary | ICD-10-CM | POA: Diagnosis present

## 2020-04-09 DIAGNOSIS — I160 Hypertensive urgency: Secondary | ICD-10-CM | POA: Diagnosis present

## 2020-04-09 DIAGNOSIS — Z8619 Personal history of other infectious and parasitic diseases: Secondary | ICD-10-CM | POA: Diagnosis present

## 2020-04-09 DIAGNOSIS — N179 Acute kidney failure, unspecified: Secondary | ICD-10-CM | POA: Diagnosis present

## 2020-04-09 LAB — GLUCOSE, CAPILLARY
Glucose-Capillary: 102 mg/dL — ABNORMAL HIGH (ref 70–99)
Glucose-Capillary: 103 mg/dL — ABNORMAL HIGH (ref 70–99)
Glucose-Capillary: 130 mg/dL — ABNORMAL HIGH (ref 70–99)
Glucose-Capillary: 92 mg/dL (ref 70–99)

## 2020-04-09 LAB — COMPREHENSIVE METABOLIC PANEL
ALT: 75 U/L — ABNORMAL HIGH (ref 0–44)
AST: 102 U/L — ABNORMAL HIGH (ref 15–41)
Albumin: 2.3 g/dL — ABNORMAL LOW (ref 3.5–5.0)
Alkaline Phosphatase: 33 U/L — ABNORMAL LOW (ref 38–126)
Anion gap: 9 (ref 5–15)
BUN: 40 mg/dL — ABNORMAL HIGH (ref 8–23)
CO2: 20 mmol/L — ABNORMAL LOW (ref 22–32)
Calcium: 8 mg/dL — ABNORMAL LOW (ref 8.9–10.3)
Chloride: 105 mmol/L (ref 98–111)
Creatinine, Ser: 2.15 mg/dL — ABNORMAL HIGH (ref 0.44–1.00)
GFR calc Af Amer: 25 mL/min — ABNORMAL LOW (ref >60)
GFR calc non Af Amer: 22 mL/min — ABNORMAL LOW (ref >60)
Glucose, Bld: 121 mg/dL — ABNORMAL HIGH (ref 70–99)
Potassium: 4.4 mmol/L (ref 3.5–5.1)
Sodium: 134 mmol/L — ABNORMAL LOW (ref 135–145)
Total Bilirubin: 1.3 mg/dL — ABNORMAL HIGH (ref 0.3–1.2)
Total Protein: 4.4 g/dL — ABNORMAL LOW (ref 6.5–8.1)

## 2020-04-09 LAB — CBC
HCT: 41.5 % (ref 36.0–46.0)
Hemoglobin: 14.3 g/dL (ref 12.0–15.0)
MCH: 30.4 pg (ref 26.0–34.0)
MCHC: 34.5 g/dL (ref 30.0–36.0)
MCV: 88.1 fL (ref 80.0–100.0)
Platelets: 74 10*3/uL — ABNORMAL LOW (ref 150–400)
RBC: 4.71 MIL/uL (ref 3.87–5.11)
RDW: 14.6 % (ref 11.5–15.5)
WBC: 20.8 10*3/uL — ABNORMAL HIGH (ref 4.0–10.5)
nRBC: 0 % (ref 0.0–0.2)

## 2020-04-09 LAB — TSH: TSH: 0.482 u[IU]/mL (ref 0.350–4.500)

## 2020-04-09 LAB — DIC (DISSEMINATED INTRAVASCULAR COAGULATION)PANEL
D-Dimer, Quant: 2.66 ug/mL-FEU — ABNORMAL HIGH (ref 0.00–0.50)
Fibrinogen: 551 mg/dL — ABNORMAL HIGH (ref 210–475)
INR: 1.2 (ref 0.8–1.2)
Platelets: 72 10*3/uL — ABNORMAL LOW (ref 150–400)
Prothrombin Time: 14.7 seconds (ref 11.4–15.2)
Smear Review: NONE SEEN
aPTT: 34 seconds (ref 24–36)

## 2020-04-09 LAB — HEPATITIS PANEL, ACUTE
HCV Ab: NONREACTIVE
Hep A IgM: NONREACTIVE
Hep B C IgM: NONREACTIVE
Hepatitis B Surface Ag: NONREACTIVE

## 2020-04-09 LAB — D-DIMER, QUANTITATIVE: D-Dimer, Quant: 3.12 ug/mL-FEU — ABNORMAL HIGH (ref 0.00–0.50)

## 2020-04-09 LAB — T4, FREE: Free T4: 0.97 ng/dL (ref 0.61–1.12)

## 2020-04-09 MED ORDER — TECHNETIUM TO 99M ALBUMIN AGGREGATED
1.6000 | Freq: Once | INTRAVENOUS | Status: AC | PRN
  Administered 2020-04-09: 1.6 via INTRAVENOUS

## 2020-04-09 NOTE — Progress Notes (Signed)
PROGRESS NOTE    Cassandra Dyer  WLN:[id number] DOB: 10-22-1941 DOA: 04/08/2020 PCP: Biagio Borg, MD   Brief Narrative:  Cassandra Dyer is a 79 y.o. female with medical history significant of HTN, HLD, remote hepatitis C, renal cell carcinoma s/p right nephrectomy in 2018, and anemia presents with complaints of vomiting over the last 3 days.  She recalls eating fish the night before onset of symptoms, but does not feel that this was related.  She had vomited several times that day and was unable to keep any of her food or medicines down.  Notes associated symptoms of decreased urine output, mild intermittent cough, some shortness of breath, swelling(legs and face), loose stools, and generalized malaise.  She has had both of her Covid vaccines.  Denies having any fever, chest pain, abdominal pain, history of heart failure, or recent sick contacts to her knowledge.  She reports that she previously had been diagnosed with hepatitis C several years ago, but never was formally treated for this. On admission into the emergency department patient was noted to be afebrile with pulse 100-117, respirations 12-23, blood pressure elevated up to 220/85, and O2 saturations maintained on room air.  Labs significant for WBC 16.7, hemoglobin 17.2, platelets 126, BUN 52, creatinine 3.17, glucose 231, AST 155, and ALT 95.  Influenza and COVID-19 screening were negative.  Imaging studies concerning for a right lower lobe consolidation/atelectasis with small pleural effusion.  Patient was given Zofran, 1 L lactated Ringer's, Rocephin, and azithromycin.    Assessment & Plan:   Principal Problem:   Sepsis ([place]) Active Problems:   Dyslipidemia   Renal cell carcinoma of right kidney (HCC)   Pneumonia   Acute renal failure (HCC)   Hypertensive urgency   History of hepatitis C   Acute hypoxic respiratory failure in the setting of questionable pneumonia, rule out PE -Chest x-ray personally reviewed, no overt  opacification, CT abdomen does remark on small pleural effusion but does not appear to be remarkable enough to cause hypoxia -D-dimer positive, will VQ scan given patient's elevated creatinine, bilateral lower extremity ultrasounds pending  Sepsis secondary to questionable right lower lobe pneumonia; CAP versus aspiration: Acute, POA.   -Tachycardia, tachypnea, leukocytosis at 16.7 at admission -suspected right lower lobe pneumonia based on CT  -Clinically patient without fever, borderline hypoxic as above but leukocytosis continues to worsen(although possibly in the setting of steroids) -Procalcitonin borderline positive -Continue antibiotics and follow clinically  AKI without history of CKD - Baseline appears to be around 0.9-1.1 per chart review  - Continue IV fluids Lab Results  Component Value Date   CREATININE 2.15 (H) 04/09/2020   CREATININE 3.17 (H) 04/08/2020   CREATININE 1.17 06/29/2019   Thrombocytopenia: Acute, POA. -Platelet count dropping precipitously overnight to 74 -Discontinue DVT prophylaxis -Repeat CBC in the morning -Continue to downtrend will need to evaluate further -not consistent with HIT given acute onset possibly secondary to acute sepsis as above  Hypertensive urgency:  -Blood pressures initially elevated up to 220.   -Continue home medications amlodipine 10 mg daily and metoprolol 100 mg daily.  Acute intractable nausea and vomiting: POA, resolving.  - Patient reports persistent nausea and vomiting over the last 3 days. - Antiemetics as needed -Symptoms improving today  Hyperglycemia: Acute, without history of diabetes Lab Results  Component Value Date   HGBA1C 5.6 04/08/2020  -Hypoglycemic protocol, low-dose sliding scale as necessary -Patient has not required any insulin since admission    Elevated liver enzymes:  Acute on chronic, POA Hepatic Function Latest Ref Rng & Units 04/09/2020 04/08/2020 06/29/2019  Total Protein 6.5 - 8.1 g/dL 4.4(L)  4.9(L) 7.4  Albumin 3.5 - 5.0 g/dL 2.3(L) 2.6(L) 4.6  AST 15 - 41 U/L 102(H) 155(H) 49(H)  ALT 0 - 44 U/L 75(H) 95(H) 48(H)  Alk Phosphatase 38 - 126 U/L 33(L) 41 58  Total Bilirubin 0.3 - 1.2 mg/dL 1.3(H) 1.1 0.3  Bilirubin, Direct 0.0 - 0.3 mg/dL - - 0.0    Hyperlipidemia:  - Continue home Crestor 10 mg daily. - CK WNL  History of renal cell carcinoma of the right kidney:  - Patient status post right nephrectomy in 2018.  History of hepatitis C:  Patient reports remote history of hepatitis C.  Denies receiving any kind of treatment. Hepatitis panel negative  DVT prophylaxis: Heparin Code Status: Full Family Communication: Patient declined need to update family at this time Disposition Plan: Possible discharge home in 2 to 3 days pending clinical course Consults called: None Admission status: Inpatient  Subjective: No acute issues or events overnight, denies abdominal pain, headache, fevers, chills, chest pain, shortness of breath.  Objective: Vitals:   04/09/20 0155 04/09/20 0211 04/09/20 0416 04/09/20 0430  BP: (!) 192/75 (!) 170/79 (!) 184/78 (!) 172/76  Pulse: 94 89 92   Resp: 20 19 18    Temp: 99.9 F (37.7 C) 99.8 F (37.7 C) 98.9 F (37.2 C)   TempSrc: Oral Oral Oral   SpO2: 97% 96% 96%   Weight: 72.4 kg     Height: 5' 2"  (1.575 m)       Intake/Output Summary (Last 24 hours) at 04/09/2020 0723 Last data filed at 04/09/2020 0600 Gross per 24 hour  Intake 1883 ml  Output --  Net 1883 ml   Filed Weights   04/08/20 0624 04/09/20 0155  Weight: 71.2 kg 72.4 kg    Examination:  General:  Pleasantly resting in bed, No acute distress. HEENT:  Normocephalic atraumatic.  Sclerae nonicteric, noninjected.  Extraocular movements intact bilaterally. Neck:  Without mass or deformity.  Trachea is midline. Lungs:  Clear to auscultate bilaterally without rhonchi, wheeze, or rales. Heart:  Regular rate and rhythm.  Without murmurs, rubs, or gallops. Abdomen:   Soft, nontender, nondistended.  Without guarding or rebound. Extremities: Without cyanosis, clubbing, edema, or obvious deformity. Vascular:  Dorsalis pedis and posterior tibial pulses palpable bilaterally. Skin:  Warm and dry, no erythema, no ulcerations.  Data Reviewed: I have personally reviewed following labs and imaging studies  CBC: Recent Labs  Lab 04/08/20 0635 04/09/20 0308  WBC 16.7* 20.8*  HGB 17.2* 14.3  HCT 51.4* 41.5  MCV 88.0 88.1  PLT 126* 74*   Basic Metabolic Panel: Recent Labs  Lab 04/08/20 0635 04/09/20 0308  NA 135 134*  K 4.5 4.4  CL 104 105  CO2 18* 20*  GLUCOSE 231* 121*  BUN 52* 40*  CREATININE 3.17* 2.15*  CALCIUM 8.5* 8.0*   GFR: Estimated Creatinine Clearance: 20.7 mL/min (A) (by C-G formula based on SCr of 2.15 mg/dL (H)). Liver Function Tests: Recent Labs  Lab 04/08/20 0635 04/09/20 0308  AST 155* 102*  ALT 95* 75*  ALKPHOS 41 33*  BILITOT 1.1 1.3*  PROT 4.9* 4.4*  ALBUMIN 2.6* 2.3*   Recent Labs  Lab 04/08/20 0635  LIPASE 65*   No results for input(s): AMMONIA in the last 168 hours. Coagulation Profile: No results for input(s): INR, PROTIME in the last 168 hours. Cardiac Enzymes: Recent Labs  Lab 04/08/20 1640  CKTOTAL 116   BNP (last 3 results) No results for input(s): PROBNP in the last 8760 hours. HbA1C: Recent Labs    04/08/20 1640  HGBA1C 5.6   CBG: Recent Labs  Lab 04/08/20 2221  GLUCAP 117*   Lipid Profile: No results for input(s): CHOL, HDL, LDLCALC, TRIG, CHOLHDL, LDLDIRECT in the last 72 hours. Thyroid Function Tests: Recent Labs    04/08/20 2230  TSH 0.482   Anemia Panel: No results for input(s): VITAMINB12, FOLATE, FERRITIN, TIBC, IRON, RETICCTPCT in the last 72 hours. Sepsis Labs: Recent Labs  Lab 04/08/20 1640  PROCALCITON 0.11  LATICACIDVEN 1.7    Recent Results (from the past 240 hour(s))  Respiratory Panel by RT PCR (Flu A&B, Covid) - Nasopharyngeal Swab     Status: None    Collection Time: 04/08/20  2:17 PM   Specimen: Nasopharyngeal Swab  Result Value Ref Range Status   SARS Coronavirus 2 by RT PCR NEGATIVE NEGATIVE Final    Comment: (NOTE) SARS-CoV-2 target nucleic acids are NOT DETECTED. The SARS-CoV-2 RNA is generally detectable in upper respiratoy specimens during the acute phase of infection. The lowest concentration of SARS-CoV-2 viral copies this assay can detect is 131 copies/mL. A negative result does not preclude SARS-Cov-2 infection and should not be used as the sole basis for treatment or other patient management decisions. A negative result may occur with  improper specimen collection/handling, submission of specimen other than nasopharyngeal swab, presence of viral mutation(s) within the areas targeted by this assay, and inadequate number of viral copies (<131 copies/mL). A negative result must be combined with clinical observations, patient history, and epidemiological information. The expected result is Negative. Fact Sheet for Patients:  PinkCheek.be Fact Sheet for Healthcare Providers:  GravelBags.it This test is not yet ap proved or cleared by the [place] FDA and  has been authorized for detection and/or diagnosis of SARS-CoV-2 by FDA under an Emergency Use Authorization (EUA). This EUA will remain  in effect (meaning this test can be used) for the duration of the COVID-19 declaration under Section 564(b)(1) of the Act, 21 U.S.C. section 360bbb-3(b)(1), unless the authorization is terminated or revoked sooner.    Influenza A by PCR NEGATIVE NEGATIVE Final   Influenza B by PCR NEGATIVE NEGATIVE Final    Comment: (NOTE) The Xpert Xpress SARS-CoV-2/FLU/RSV assay is intended as an aid in  the diagnosis of influenza from Nasopharyngeal swab specimens and  should not be used as a sole basis for treatment. Nasal washings and  aspirates are unacceptable for Xpert Xpress  SARS-CoV-2/FLU/RSV  testing. Fact Sheet for Patients: PinkCheek.be Fact Sheet for Healthcare Providers: GravelBags.it This test is not yet approved or cleared by the [place] FDA and  has been authorized for detection and/or diagnosis of SARS-CoV-2 by  FDA under an Emergency Use Authorization (EUA). This EUA will remain  in effect (meaning this test can be used) for the duration of the  Covid-19 declaration under Section 564(b)(1) of the Act, 21  U.S.C. section 360bbb-3(b)(1), unless the authorization is  terminated or revoked. Performed at [facility], [place] [address] [place], [place] [address]   Culture, blood (routine x 2)     Status: None (Preliminary result)   Collection Time: 04/08/20  4:40 PM   Specimen: Site Not Specified; Blood  Result Value Ref Range Status   Specimen Description SITE NOT SPECIFIED  Final   Special Requests   Final    BOTTLES DRAWN AEROBIC AND  ANAEROBIC Blood Culture adequate volume   Culture   Final    NO GROWTH < 12 HOURS Performed at [facility] [address], [place], [place] [address]    Report Status PENDING  Incomplete         Radiology Studies: CT ABDOMEN PELVIS WO CONTRAST  Result Date: 04/08/2020 CLINICAL DATA:  Vomiting. Facial and left hand swelling. EXAM: CT ABDOMEN AND PELVIS WITHOUT CONTRAST TECHNIQUE: Multidetector CT imaging of the abdomen and pelvis was performed following the standard protocol without IV contrast. COMPARISON:  08/05/2017 FINDINGS: Lower chest: Small right pleural effusion. Consolidation/atelectasis posteriorly at the right lung base. Coronary calcifications. Hepatobiliary: Stable small hypodense lesion presumably cysts. No new lesion. No gallstones, gallbladder wall thickening, or biliary dilatation. Pancreas: Unremarkable. No pancreatic ductal dilatation or surrounding inflammatory changes. Spleen: Normal in size without focal abnormality.  Adrenals/Urinary Tract: 1.5 cm left adrenal nodule as before. Left kidney unremarkable. Previous right nephrectomy. Stomach/Bowel: Stomach nondistended. Small bowel decompressed. Colon is nondilated with scattered diverticula. Vascular/Lymphatic: Moderate calcified aortoiliac atheromatous plaque without aneurysm. No retroperitoneal, abdominal, or pelvic adenopathy. Reproductive: Calcified uterine fibroids. 4 cm left cystic adnexal process, previously 3.1 cm on 06/14/2016. Smaller right adnexal cyst. Other: No ascites. No free air. Musculoskeletal: Small ventral hernia containing only mesenteric fat. Stable L3 superior endplate compression deformity. No acute fracture or worrisome bone lesion. IMPRESSION: 1. Small right pleural effusion with adjacent consolidation/atelectasis at the right lung base. 2. Stable left adrenal nodule, likely adenoma. 3. Stable 4 cm left cystic adnexal process, previously 3.1 cm on 06/14/2016. Consider elective outpatient pelvic ultrasound for further characterization. 4. Colonic diverticulosis. 5. Uterine fibroids. 6. Small ventral hernia containing only mesenteric fat. Aortic Atherosclerosis (ICD10-I70.0). Electronically Signed   By: Lucrezia Europe M.D.   On: 04/08/2020 13:36   DG Chest Port 1 View  Result Date: 04/08/2020 CLINICAL DATA:  79 year old female with cough and vomiting for 2 days. Face and left hand swelling. EXAM: PORTABLE CHEST 1 VIEW COMPARISON:  Chest radiographs 08/02/2017 and earlier. FINDINGS: Portable AP upright view at 1115 hours. Mildly lower lung volumes. Mediastinal contours are stable and within normal limits. Visualized tracheal air column is within normal limits. Allowing for portable technique the lungs are clear. No pneumothorax. Normal visible bowel gas pattern. No pneumoperitoneum identified. No acute osseous abnormality identified. IMPRESSION: Negative portable chest. Electronically Signed   By: Genevie Ann M.D.   On: 04/08/2020 11:21        Scheduled  Meds: . amLODipine  10 mg Oral Daily  . azithromycin  500 mg Oral Daily  . guaiFENesin  600 mg Oral BID  . heparin  5,000 Units Subcutaneous Q8H  . insulin aspart  0-6 Units Subcutaneous TID WC  . metoprolol succinate  100 mg Oral Daily  . sodium chloride flush  3 mL Intravenous Q12H   Continuous Infusions: . sodium chloride 100 mL/hr at 04/09/20 0159  . cefTRIAXone (ROCEPHIN)  IV       LOS: 1 day   Time spent: 9mn  Anshi Jalloh C Sharisse Rantz, DO [facility]  If 7PM-7AM, please contact night-coverage [email or web address]  04/09/2020, 7:23 AM

## 2020-04-10 ENCOUNTER — Inpatient Hospital Stay (HOSPITAL_COMMUNITY): Payer: Medicare Other

## 2020-04-10 DIAGNOSIS — R7989 Other specified abnormal findings of blood chemistry: Secondary | ICD-10-CM

## 2020-04-10 LAB — GLUCOSE, CAPILLARY
Glucose-Capillary: 83 mg/dL (ref 70–99)
Glucose-Capillary: 90 mg/dL (ref 70–99)
Glucose-Capillary: 78 mg/dL (ref 70–99)
Glucose-Capillary: 87 mg/dL (ref 70–99)

## 2020-04-10 LAB — BASIC METABOLIC PANEL
Anion gap: 6 (ref 5–15)
BUN: 19 mg/dL (ref 8–23)
CO2: 23 mmol/L (ref 22–32)
Calcium: 7.8 mg/dL — ABNORMAL LOW (ref 8.9–10.3)
Chloride: 108 mmol/L (ref 98–111)
Creatinine, Ser: 1.5 mg/dL — ABNORMAL HIGH (ref 0.44–1.00)
GFR calc Af Amer: 39 mL/min — ABNORMAL LOW (ref >60)
GFR calc non Af Amer: 33 mL/min — ABNORMAL LOW (ref >60)
Glucose, Bld: 115 mg/dL — ABNORMAL HIGH (ref 70–99)
Potassium: 3.8 mmol/L (ref 3.5–5.1)
Sodium: 137 mmol/L (ref 135–145)

## 2020-04-10 LAB — CBC
HCT: 40.7 % (ref 36.0–46.0)
Hemoglobin: 13.5 g/dL (ref 12.0–15.0)
MCH: 29.5 pg (ref 26.0–34.0)
MCHC: 33.2 g/dL (ref 30.0–36.0)
MCV: 89.1 fL (ref 80.0–100.0)
Platelets: 86 10*3/uL — ABNORMAL LOW (ref 150–400)
RBC: 4.57 MIL/uL (ref 3.87–5.11)
RDW: 14.8 % (ref 11.5–15.5)
WBC: 16 10*3/uL — ABNORMAL HIGH (ref 4.0–10.5)
nRBC: 0 % (ref 0.0–0.2)

## 2020-04-10 NOTE — Plan of Care (Signed)
  Problem: Education: Goal: Knowledge of General Education information will improve Description Including pain rating scale, medication(s)/side effects and non-pharmacologic comfort measures Outcome: Progressing   

## 2020-04-10 NOTE — Progress Notes (Signed)
PROGRESS NOTE    Cassandra Dyer  EYC:[id number] DOB: December 09, 1941 DOA: 04/08/2020 PCP: Biagio Borg, MD   Brief Narrative:  Cassandra Dyer is a 79 y.o. female with medical history significant of HTN, HLD, remote hepatitis C, renal cell carcinoma s/p right nephrectomy in 2018, and anemia presents with complaints of vomiting over the last 3 days.  She recalls eating fish the night before onset of symptoms, but does not feel that this was related.  She had vomited several times that day and was unable to keep any of her food or medicines down.  Notes associated symptoms of decreased urine output, mild intermittent cough, some shortness of breath, swelling(legs and face), loose stools, and generalized malaise.  She has had both of her Covid vaccines.  Denies having any fever, chest pain, abdominal pain, history of heart failure, or recent sick contacts to her knowledge.  She reports that she previously had been diagnosed with hepatitis C several years ago, but never was formally treated for this. On admission into the emergency department patient was noted to be afebrile with pulse 100-117, respirations 12-23, blood pressure elevated up to 220/85, and O2 saturations maintained on room air.  Labs significant for WBC 16.7, hemoglobin 17.2, platelets 126, BUN 52, creatinine 3.17, glucose 231, AST 155, and ALT 95.  Influenza and COVID-19 screening were negative.  Imaging studies concerning for a right lower lobe consolidation/atelectasis with small pleural effusion.  Patient was given Zofran, 1 L lactated Ringer's, Rocephin, and azithromycin.    Assessment & Plan:   Principal Problem:   Sepsis ([place]) Active Problems:   Dyslipidemia   Renal cell carcinoma of right kidney (HCC)   Pneumonia   Acute renal failure (HCC)   Hypertensive urgency   History of hepatitis C   Acute hypoxic respiratory failure in the setting of questionable pneumonia, rule out PE Cannot rule out underlying chronic COPD without  formal diagnosis -Chest x-ray personally reviewed, no overt opacification, CT abdomen does remark on small pleural effusion but does not appear to be remarkable enough to cause hypoxia -D-dimer positive, VQ negative, bilateral lower extremity Doppler pending -Patient endorses a 40+ pack year smoking history with no formal diagnosis of COPD or hypoxia in the past.  This may account for patient's moderate hypoxia with very small noted pneumonia and effusion as above.  Patient would likely benefit from outpatient pulmonology evaluation and PFTs.  She fortunately no longer smokes and has quit some 16 years ago. -Continue daily ambulatory O2 screens -May need oxygen at home after discharge with likely prolonged wean given suspected poor baseline lung function  Sepsis secondary to questionable right lower lobe pneumonia; CAP versus aspiration: Acute, POA.   -Tachycardia, tachypnea, leukocytosis at 16.7 at admission -suspected right lower lobe pneumonia based on CT  -Clinically patient without fever, borderline hypoxic as above but leukocytosis continues -Procalcitonin borderline positive -Continue antibiotics and follow clinically  AKI without history of CKD, resolving - Baseline appears to be around 0.9-1.1 per chart review  -Likely prerenal given discussion with patient and poor p.o. intake recently - Continue IV fluids Lab Results  Component Value Date   CREATININE 1.50 (H) 04/10/2020   CREATININE 2.15 (H) 04/09/2020   CREATININE 3.17 (H) 04/08/2020   Thrombocytopenia: Acute, POA. -Platelet count dropping precipitously overnight to 74 -Discontinue DVT prophylaxis -Repeat CBC in the morning -Continue to downtrend will need to evaluate further -not consistent with HIT given acute onset possibly secondary to acute sepsis as above  Hypertensive  urgency:  -Blood pressures initially elevated up to 220.   -Continue home medications amlodipine 10 mg daily and metoprolol 100 mg daily.  Acute  intractable nausea and vomiting: POA, resolving.  - Patient reports persistent nausea and vomiting over the last 3 days. - Antiemetics as needed -Symptoms improving today  Hyperglycemia: Acute, without history of diabetes Lab Results  Component Value Date   HGBA1C 5.6 04/08/2020  -Hypoglycemic protocol, low-dose sliding scale as necessary -Patient has not required any insulin since admission    Elevated liver enzymes: Acute on chronic, POA Hepatic Function Latest Ref Rng & Units 04/09/2020 04/08/2020 06/29/2019  Total Protein 6.5 - 8.1 g/dL 4.4(L) 4.9(L) 7.4  Albumin 3.5 - 5.0 g/dL 2.3(L) 2.6(L) 4.6  AST 15 - 41 U/L 102(H) 155(H) 49(H)  ALT 0 - 44 U/L 75(H) 95(H) 48(H)  Alk Phosphatase 38 - 126 U/L 33(L) 41 58  Total Bilirubin 0.3 - 1.2 mg/dL 1.3(H) 1.1 0.3  Bilirubin, Direct 0.0 - 0.3 mg/dL - - 0.0    Hyperlipidemia:  - Continue home Crestor 10 mg daily. - CK WNL  History of renal cell carcinoma of the right kidney:  - Patient status post right nephrectomy in 2018.  History of hepatitis C:  Patient reports remote history of hepatitis C.  Denies receiving any kind of treatment. Hepatitis panel negative  DVT prophylaxis: Heparin Code Status: Full Family Communication: Patient declined need to update family at this time Disposition Plan: Possible discharge home in 2 to 3 days pending clinical course Consults called: None Admission status: Inpatient  Subjective: No acute issues or events overnight, denies abdominal pain, headache, fevers, chills, chest pain, shortness of breath.  Objective: Vitals:   04/09/20 2200 04/10/20 0447 04/10/20 0527 04/10/20 1542  BP: (!) 178/74 (!) 186/97 (!) 172/86 (!) 176/58  Pulse:  91  87  Resp:  16  17  Temp:  98.6 F (37 C)  98.8 F (37.1 C)  TempSrc:  Oral  Oral  SpO2:  94%  (!) 71%  Weight:      Height:        Intake/Output Summary (Last 24 hours) at 04/10/2020 1600 Last data filed at 04/10/2020 1110 Gross per 24 hour    Intake 2391.08 ml  Output --  Net 2391.08 ml   Filed Weights   04/08/20 0624 04/09/20 0155  Weight: 71.2 kg 72.4 kg    Examination:  General:  Pleasantly resting in bed, No acute distress. HEENT:  Normocephalic atraumatic.  Sclerae nonicteric, noninjected.  Extraocular movements intact bilaterally. Neck:  Without mass or deformity.  Trachea is midline. Lungs:  Clear to auscultate bilaterally without rhonchi, wheeze, or rales. Heart:  Regular rate and rhythm.  Without murmurs, rubs, or gallops. Abdomen:  Soft, nontender, nondistended.  Without guarding or rebound. Extremities: Without cyanosis, clubbing, edema, or obvious deformity. Vascular:  Dorsalis pedis and posterior tibial pulses palpable bilaterally. Skin:  Warm and dry, no erythema, no ulcerations.  Data Reviewed: I have personally reviewed following labs and imaging studies  CBC: Recent Labs  Lab 04/08/20 0635 04/09/20 0308 04/09/20 1549  WBC 16.7* 20.8*  --   HGB 17.2* 14.3  --   HCT 51.4* 41.5  --   MCV 88.0 88.1  --   PLT 126* 74* 72*   Basic Metabolic Panel: Recent Labs  Lab 04/08/20 0635 04/09/20 0308 04/10/20 1434  NA 135 134* 137  K 4.5 4.4 3.8  CL 104 105 108  CO2 18* 20* 23  GLUCOSE 231*  121* 115*  BUN 52* 40* 19  CREATININE 3.17* 2.15* 1.50*  CALCIUM 8.5* 8.0* 7.8*   GFR: Estimated Creatinine Clearance: 29.7 mL/min (A) (by C-G formula based on SCr of 1.5 mg/dL (H)). Liver Function Tests: Recent Labs  Lab 04/08/20 0635 04/09/20 0308  AST 155* 102*  ALT 95* 75*  ALKPHOS 41 33*  BILITOT 1.1 1.3*  PROT 4.9* 4.4*  ALBUMIN 2.6* 2.3*   Recent Labs  Lab 04/08/20 0635  LIPASE 65*   No results for input(s): AMMONIA in the last 168 hours. Coagulation Profile: Recent Labs  Lab 04/09/20 1549  INR 1.2   Cardiac Enzymes: Recent Labs  Lab 04/08/20 1640  CKTOTAL 116   BNP (last 3 results) No results for input(s): PROBNP in the last 8760 hours. HbA1C: Recent Labs     04/08/20 1640  HGBA1C 5.6   CBG: Recent Labs  Lab 04/09/20 1143 04/09/20 1641 04/09/20 2107 04/10/20 0725 04/10/20 1129  GLUCAP 103* 130* 92 83 90   Lipid Profile: No results for input(s): CHOL, HDL, LDLCALC, TRIG, CHOLHDL, LDLDIRECT in the last 72 hours. Thyroid Function Tests: Recent Labs    04/08/20 2230 04/09/20 0655  TSH 0.482  --   FREET4  --  0.97   Anemia Panel: No results for input(s): VITAMINB12, FOLATE, FERRITIN, TIBC, IRON, RETICCTPCT in the last 72 hours. Sepsis Labs: Recent Labs  Lab 04/08/20 1640  PROCALCITON 0.11  LATICACIDVEN 1.7    Recent Results (from the past 240 hour(s))  Respiratory Panel by RT PCR (Flu A&B, Covid) - Nasopharyngeal Swab     Status: None   Collection Time: 04/08/20  2:17 PM   Specimen: Nasopharyngeal Swab  Result Value Ref Range Status   SARS Coronavirus 2 by RT PCR NEGATIVE NEGATIVE Final    Comment: (NOTE) SARS-CoV-2 target nucleic acids are NOT DETECTED. The SARS-CoV-2 RNA is generally detectable in upper respiratoy specimens during the acute phase of infection. The lowest concentration of SARS-CoV-2 viral copies this assay can detect is 131 copies/mL. A negative result does not preclude SARS-Cov-2 infection and should not be used as the sole basis for treatment or other patient management decisions. A negative result may occur with  improper specimen collection/handling, submission of specimen other than nasopharyngeal swab, presence of viral mutation(s) within the areas targeted by this assay, and inadequate number of viral copies (<131 copies/mL). A negative result must be combined with clinical observations, patient history, and epidemiological information. The expected result is Negative. Fact Sheet for Patients:  PinkCheek.be Fact Sheet for Healthcare Providers:  GravelBags.it This test is not yet ap proved or cleared by the [place] FDA and  has been  authorized for detection and/or diagnosis of SARS-CoV-2 by FDA under an Emergency Use Authorization (EUA). This EUA will remain  in effect (meaning this test can be used) for the duration of the COVID-19 declaration under Section 564(b)(1) of the Act, 21 U.S.C. section 360bbb-3(b)(1), unless the authorization is terminated or revoked sooner.    Influenza A by PCR NEGATIVE NEGATIVE Final   Influenza B by PCR NEGATIVE NEGATIVE Final    Comment: (NOTE) The Xpert Xpress SARS-CoV-2/FLU/RSV assay is intended as an aid in  the diagnosis of influenza from Nasopharyngeal swab specimens and  should not be used as a sole basis for treatment. Nasal washings and  aspirates are unacceptable for Xpert Xpress SARS-CoV-2/FLU/RSV  testing. Fact Sheet for Patients: PinkCheek.be Fact Sheet for Healthcare Providers: GravelBags.it This test is not yet approved or cleared by the  [place] Architectural technologist and  has been authorized for detection and/or diagnosis of SARS-CoV-2 by  FDA under an Print production planner (EUA). This EUA will remain  in effect (meaning this test can be used) for the duration of the  Covid-19 declaration under Section 564(b)(1) of the Act, 21  U.S.C. section 360bbb-3(b)(1), unless the authorization is  terminated or revoked. Performed at [facility], [place] [address] [place], [place] [address]   Culture, blood (routine x 2)     Status: None (Preliminary result)   Collection Time: 04/08/20  4:40 PM   Specimen: Site Not Specified; Blood  Result Value Ref Range Status   Specimen Description SITE NOT SPECIFIED  Final   Special Requests   Final    BOTTLES DRAWN AEROBIC AND ANAEROBIC Blood Culture adequate volume   Culture   Final    NO GROWTH 2 DAYS Performed at [facility], [place] [address], [place], [place] [address]    Report Status PENDING  Incomplete  Culture, blood (routine x 2)     Status: None (Preliminary  result)   Collection Time: 04/09/20  3:08 AM   Specimen: BLOOD LEFT ARM  Result Value Ref Range Status   Specimen Description BLOOD LEFT ARM  Final   Special Requests   Final    BOTTLES DRAWN AEROBIC ONLY Blood Culture results may not be optimal due to an inadequate volume of blood received in culture bottles   Culture   Final    NO GROWTH 1 DAY Performed at [facility], [place] [address], [place],  [address]    Report Status PENDING  Incomplete         Radiology Studies: NM Pulmonary Perfusion  Result Date: 04/09/2020 CLINICAL DATA:  PE suspected, low/intermediate prob, positive D-dimer EXAM: NUCLEAR MEDICINE PERFUSION LUNG SCAN TECHNIQUE: Perfusion images were obtained in multiple projections after intravenous injection of radiopharmaceutical. Ventilation scans intentionally deferred if perfusion scan and chest x-ray adequate for interpretation during COVID 19 epidemic. RADIOPHARMACEUTICALS:  1.6 mCi Tc-70mMAA IV COMPARISON:  Chest radiograph earlier this day. Lung bases from abdominal CT yesterday. FINDINGS: Slight heterogeneous perfusion to both lungs without segmental perfusion defect. IMPRESSION: Mild heterogeneous pulmonary perfusion consistent with low probability of pulmonary embolus. No segmental perfusion defects. Electronically Signed   By: MKeith RakeM.D.   On: 04/09/2020 17:35   DG CHEST PORT 1 VIEW  Result Date: 04/09/2020 CLINICAL DATA:  Suspected PE. For V/Q correlation. EXAM: PORTABLE CHEST 1 VIEW COMPARISON:  None. FINDINGS: Heart size is normal. Aortic atherosclerosis. Chronic interstitial lung markings again demonstrated. These are questionably more prominent raising the possibility of mild edema or early pneumonia. No consolidation, collapse or visible effusion. IMPRESSION: Chronic lung markings, possibly slightly more prominent than seen yesterday raising the possibility early edema or pneumonia. Electronically Signed   By: MNelson ChimesM.D.   On:  04/09/2020 16:34   VAS [place]OWER EXTREMITY VENOUS (DVT)  Result Date: 04/10/2020  Lower Venous DVTStudy Indications: Positive d-dimer.  Comparison Study: No priors. Performing Technologist: ROda CoganRDMS, RVT  Examination Guidelines: A complete evaluation includes B-mode imaging, spectral Doppler, color Doppler, and power Doppler as needed of all accessible portions of each vessel. Bilateral testing is considered an integral part of a complete examination. Limited examinations for reoccurring indications may be performed as noted. The reflux portion of the exam is performed with the patient in reverse Trendelenburg.  +---------+---------------+---------+-----------+----------+--------------+ RIGHT    CompressibilityPhasicitySpontaneityPropertiesThrombus Aging +---------+---------------+---------+-----------+----------+--------------+ CFV  Full           Yes      Yes                                 +---------+---------------+---------+-----------+----------+--------------+ SFJ      Full                                                        +---------+---------------+---------+-----------+----------+--------------+ FV Prox  Full                                                        +---------+---------------+---------+-----------+----------+--------------+ FV Mid   Full                                                        +---------+---------------+---------+-----------+----------+--------------+ FV DistalFull                                                        +---------+---------------+---------+-----------+----------+--------------+ PFV      Full                                                        +---------+---------------+---------+-----------+----------+--------------+ POP      Full           Yes      Yes                                 +---------+---------------+---------+-----------+----------+--------------+ PTV      Full                                                         +---------+---------------+---------+-----------+----------+--------------+ PERO     Full                                                        +---------+---------------+---------+-----------+----------+--------------+   +---------+---------------+---------+-----------+----------+--------------+ LEFT     CompressibilityPhasicitySpontaneityPropertiesThrombus Aging +---------+---------------+---------+-----------+----------+--------------+ CFV      Full           Yes      Yes                                 +---------+---------------+---------+-----------+----------+--------------+  SFJ      Full                                                        +---------+---------------+---------+-----------+----------+--------------+ FV Prox  Full                                                        +---------+---------------+---------+-----------+----------+--------------+ FV Mid   Full                                                        +---------+---------------+---------+-----------+----------+--------------+ FV DistalFull                                                        +---------+---------------+---------+-----------+----------+--------------+ PFV      Full                                                        +---------+---------------+---------+-----------+----------+--------------+ POP      Full           Yes      Yes                                 +---------+---------------+---------+-----------+----------+--------------+ PTV      Full                                                        +---------+---------------+---------+-----------+----------+--------------+ PERO     Full                                                        +---------+---------------+---------+-----------+----------+--------------+     Summary: RIGHT: - There is no evidence of deep vein thrombosis in  the lower extremity.  - No cystic structure found in the popliteal fossa.  LEFT: - There is no evidence of deep vein thrombosis in the lower extremity.  - No cystic structure found in the popliteal fossa.  *See table(s) above for measurements and observations.    Preliminary    Scheduled Meds: . amLODipine  10 mg Oral Daily  . azithromycin  500 mg Oral Daily  . guaiFENesin  600 mg Oral BID  . insulin aspart  0-6 Units Subcutaneous TID WC  . metoprolol succinate  100 mg Oral  Daily  . sodium chloride flush  3 mL Intravenous Q12H   Continuous Infusions: . sodium chloride 100 mL/hr at 04/10/20 1005  . cefTRIAXone (ROCEPHIN)  IV 2 g (04/10/20 1009)     LOS: 2 days   Time spent: 69mn  Seraj Dunnam C Tatia Petrucci, DO [facility]  If 7PM-7AM, please contact night-coverage [email or web address]  04/10/2020, 4:00 PM

## 2020-04-10 NOTE — Progress Notes (Signed)
SATURATION QUALIFICATIONS: (This note is used to comply with regulatory documentation for home oxygen)  Patient Saturations on Room Air at Rest = 94%  Patient Saturations on Room Air while Ambulating = 84%  Patient Saturations on 3 Liters of oxygen while Ambulating = 91%  Please briefly explain why patient needs home oxygen: Pt desats  while ambulating.

## 2020-04-10 NOTE — Evaluation (Signed)
Physical Therapy Evaluation and Discharge  Patient Details Name: Cassandra Dyer MRN: [id number] DOB: Mar 17, 1941 Today's Date: 04/10/2020   History of Present Illness  Pt is a 79 y/o female admitted secondary to sepsis and ? RLL PNA. PMH includes R kidney cancer s/p nephrectomy, hep C, and HTN.   Clinical Impression  Patient evaluated by Physical Therapy with no further acute PT needs identified. All education has been completed and the patient has no further questions. Pt overall at a supervision to mod I level. Pt overall steady with no LOB noted. Oxygen sats at 90-91% on 3L of oxygen during gait; notified RN. Educated about ambulating with nursing staff at least 2X/day. See below for any follow-up Physical Therapy or equipment needs. PT is signing off. Thank you for this referral. If needs change, please re-consult.      Follow Up Recommendations No PT follow up    Equipment Recommendations  None recommended by PT    Recommendations for Other Services       Precautions / Restrictions Precautions Precautions: Other (comment) Precaution Comments: Watch O2 sats.  Restrictions Weight Bearing Restrictions: No      Mobility  Bed Mobility               General bed mobility comments: Up with RN at beginning of session.   Transfers Overall transfer level: Modified independent                  Ambulation/Gait Ambulation/Gait assistance: Supervision Gait Distance (Feet): 500 Feet Assistive device: IV Pole Gait Pattern/deviations: Step-through pattern;Decreased stride length Gait velocity: Decreased   General Gait Details: Cautious gait, however, overall steady. Supervision for safety and line management. Able to navigate obstacles in hall without LOB. Pt reports gait is close to baseline. Oxygen sats at 90-91% on 3L during ambulation.   Stairs            Wheelchair Mobility    Modified Rankin (Stroke Patients Only)       Balance Overall balance  assessment: No apparent balance deficits (not formally assessed)                                           Pertinent Vitals/Pain Pain Assessment: No/denies pain    Home Living Family/patient expects to be discharged to:: Private residence Living Arrangements: Spouse/significant other(boyfriend) Available Help at Discharge: Family;Available 24 hours/day Type of Home: House Home Access: Level entry     Home Layout: One level Home Equipment: None      Prior Function Level of Independence: Independent               Hand Dominance        Extremity/Trunk Assessment   Upper Extremity Assessment Upper Extremity Assessment: Defer to OT evaluation    Lower Extremity Assessment Lower Extremity Assessment: Overall WFL for tasks assessed    Cervical / Trunk Assessment Cervical / Trunk Assessment: Normal  Communication   Communication: No difficulties  Cognition Arousal/Alertness: Awake/alert Behavior During Therapy: WFL for tasks assessed/performed Overall Cognitive Status: Within Functional Limits for tasks assessed                                        General Comments General comments (skin integrity, edema, etc.): Educated about ambulating with nursing  staff at least 2X/day if able.     Exercises     Assessment/Plan    PT Assessment Patent does not need any further PT services  PT Problem List         PT Treatment Interventions      PT Goals (Current goals can be found in the Care Plan section)  Acute Rehab PT Goals Patient Stated Goal: to go home PT Goal Formulation: With patient Time For Goal Achievement: 04/10/20 Potential to Achieve Goals: Good    Frequency     Barriers to discharge        Co-evaluation               AM-PAC PT "6 Clicks" Mobility  Outcome Measure Help needed turning from your back to your side while in a flat bed without using bedrails?: None Help needed moving from lying on your  back to sitting on the side of a flat bed without using bedrails?: None Help needed moving to and from a bed to a chair (including a wheelchair)?: None Help needed standing up from a chair using your arms (e.g., wheelchair or bedside chair)?: None Help needed to walk in hospital room?: None Help needed climbing 3-5 steps with a railing? : A Little 6 Click Score: 23    End of Session Equipment Utilized During Treatment: Oxygen Activity Tolerance: Patient tolerated treatment well Patient left: in chair;with call bell/phone within reach Nurse Communication: Mobility status;Other (comment)(oxygen sats) PT Visit Diagnosis: Other abnormalities of gait and mobility (R26.89)    Time: 1350-1406 PT Time Calculation (min) (ACUTE ONLY): 16 min   Charges:   PT Evaluation $PT Eval Low Complexity: 1 Low          Lou Miner, DPT  Acute Rehabilitation Services  Pager: [phone] Office: [phone]   Rudean Hitt 04/10/2020, 2:14 PM

## 2020-04-11 LAB — GLUCOSE, CAPILLARY
Glucose-Capillary: 83 mg/dL (ref 70–99)
Glucose-Capillary: 128 mg/dL — ABNORMAL HIGH (ref 70–99)

## 2020-04-11 LAB — CBC
HCT: 37.2 % (ref 36.0–46.0)
Hemoglobin: 12.1 g/dL (ref 12.0–15.0)
MCH: 29.2 pg (ref 26.0–34.0)
MCHC: 32.5 g/dL (ref 30.0–36.0)
MCV: 89.6 fL (ref 80.0–100.0)
Platelets: 92 10*3/uL — ABNORMAL LOW (ref 150–400)
RBC: 4.15 MIL/uL (ref 3.87–5.11)
RDW: 14.6 % (ref 11.5–15.5)
WBC: 12.7 10*3/uL — ABNORMAL HIGH (ref 4.0–10.5)
nRBC: 0 % (ref 0.0–0.2)

## 2020-04-11 LAB — BASIC METABOLIC PANEL
Anion gap: 6 (ref 5–15)
BUN: 16 mg/dL (ref 8–23)
CO2: 20 mmol/L — ABNORMAL LOW (ref 22–32)
Calcium: 7.7 mg/dL — ABNORMAL LOW (ref 8.9–10.3)
Chloride: 112 mmol/L — ABNORMAL HIGH (ref 98–111)
Creatinine, Ser: 1.32 mg/dL — ABNORMAL HIGH (ref 0.44–1.00)
GFR calc Af Amer: 45 mL/min — ABNORMAL LOW (ref >60)
GFR calc non Af Amer: 39 mL/min — ABNORMAL LOW (ref >60)
Glucose, Bld: 94 mg/dL (ref 70–99)
Potassium: 3.8 mmol/L (ref 3.5–5.1)
Sodium: 138 mmol/L (ref 135–145)

## 2020-04-11 MED ORDER — AZITHROMYCIN 250 MG PO TABS
ORAL_TABLET | ORAL | 0 refills | Status: AC
Start: 2020-04-11 — End: 2020-04-16

## 2020-04-11 MED ORDER — IPRATROPIUM-ALBUTEROL 0.5-2.5 (3) MG/3ML IN SOLN: 3.0000 mL | RESPIRATORY_TRACT | Status: DC

## 2020-04-11 MED ORDER — IPRATROPIUM-ALBUTEROL 0.5-2.5 (3) MG/3ML IN SOLN
3.0000 mL | RESPIRATORY_TRACT | Status: DC
  Administered 2020-04-11: 3 mL via RESPIRATORY_TRACT
  Filled 2020-04-11: qty 3

## 2020-04-11 NOTE — Discharge Summary (Signed)
Physician Discharge Summary  Cassandra Dyer ZDG:[id number] DOB: [birth year] DOA: 04/08/2020  PCP: Biagio Borg, MD  Admit date: 04/08/2020 Discharge date: 04/11/2020  Admitted From: Home Disposition: Home with home health  Recommendations for Outpatient Follow-up:  1. Follow up with PCP in 1-2 weeks 2. Please obtain BMP/CBC in one week  Home Health: Yes Equipment/Devices: Oxygen, 2 L with ambulation  Discharge Condition: Stable CODE STATUS: Full code Diet recommendation: Low-salt cardiac diet  Brief/Interim Summary: Cassandra R [name] a 79 y.o.femalewith medical history significant ofHTN, HLD, remote hepatitis C,renal cell carcinomas/pright nephrectomy in 2018, and anemiapresents with complaints of vomiting over the last 3 days. She recalls eating fish the night before onset of symptoms, but does not feel that this was related. She had vomited several times that day and was unable to keep any of her food or medicines down. Notes associated symptoms of decreased urine output, mild intermittent cough, some shortness of breath, swelling(legs and face), loose stools, and generalized malaise. She has had both of her Covid vaccines. Denies having any fever, chest pain, abdominal pain, history of heart failure, or recent sick contacts to her knowledge. She reports that she previously had been diagnosed with hepatitis C several years ago, but never was formally treated for this. On admission into the emergency department patient was noted to be afebrile with pulse 100-117, respirations 12-23,blood pressure elevated up to 220/85, and O2 saturations maintainedon room air. Labs significant for WBC 16.7, hemoglobin 17.2, platelets 126, BUN 52, creatinine 3.17, glucose 231, AST 155, and ALT 95. Influenza and COVID-19 screening were negative. Imaging studies concerning for a right lower lobe consolidation/atelectasis with small pleural effusion. Patient was given Zofran, 1 L lactated  Ringer's, Rocephin, and azithromycin  Patient admitted as above with acute on chronic hypoxic respiratory failure likely pneumonia, community-acquired on top of previously undiagnosed COPD.  Patient was moderately hypoxic at intake, pneumonia on imaging was not impressive, would not expect small pneumonia to cause such drastic hypoxia, as such D-dimer was obtained and positive subsequent V/Q and bilateral lower extremity ultrasound were negative for PE or DVT.  Lengthy discussion with patient about history, indicates she has some 40+ pack year smoking history with what appears to be chronic dyspnea with exertion worsening over the past few years.  She has never been formally evaluated or diagnosed with COPD in the outpatient setting but given history and symptoms this is the most likely etiology of her hypoxia.  Patient is resolving from her pneumonia, otherwise stable for discharge, given ongoing hypoxia again likely chronic patient has been evaluated requiring 2 L nasal cannula with ambulation only, able to tolerate room air at rest.  We discussed patient will need to continue complete course of antibiotics and follow with PCP with likely referral to pulmonology in the next few weeks for further evaluation and testing for COPD evaluation.  Patient otherwise stable and agreeable for discharge home.  Discharge Diagnoses:  Principal Problem:   Sepsis ([place]) Active Problems:   Dyslipidemia   Renal cell carcinoma of right kidney (HCC)   Pneumonia   Acute renal failure (HCC)   Hypertensive urgency   History of hepatitis C    Discharge Instructions  Discharge Instructions    Call MD for:  difficulty breathing, headache or visual disturbances   Complete by: As directed    Call MD for:  extreme fatigue   Complete by: As directed    Call MD for:  hives   Complete by: As directed  Call MD for:  persistant dizziness or light-headedness   Complete by: As directed    Call MD for:  persistant nausea  and vomiting   Complete by: As directed    Call MD for:  severe uncontrolled pain   Complete by: As directed    Call MD for:  temperature >100.4   Complete by: As directed    Diet - low sodium heart healthy   Complete by: As directed    Increase activity slowly   Complete by: As directed      Allergies as of 04/11/2020   No Known Allergies     Medication List    TAKE these medications   amLODipine 10 MG tablet Commonly known as: NORVASC Take 1 tablet (10 mg total) by mouth daily. Annual appt due in July must see provider for future refills   azithromycin 250 MG tablet Commonly known as: Zithromax Z-Pak Take 1 tablet po daily x 3 days   Black Currant Seed Oil 500 MG Caps Take 500 mg by mouth daily.   CALCIUM 1200 PO Take 1,200 mg by mouth daily.   FISH OIL PO Take 1 tablet by mouth daily.   Garlic 3419 MG Caps Take 2,000 mg by mouth daily.   hydrochlorothiazide 12.5 MG capsule Commonly known as: Microzide Take 1 capsule (12.5 mg total) by mouth daily.   metoprolol succinate 100 MG 24 hr tablet Commonly known as: TOPROL-XL Take 1 tablet (100 mg total) by mouth daily. Take with or immediately following a meal.   multivitamin with minerals Tabs tablet Take 1 tablet by mouth daily.   OVER THE COUNTER MEDICATION Take 1 capsule by mouth daily. Mega juice   rosuvastatin 10 MG tablet Commonly known as: CRESTOR Take 1 tablet by mouth once daily   VITAMIN C PO Take 500 mg by mouth in the morning and at bedtime.   VITAMIN D PO Take 2,000 mcg by mouth See admin instructions. Takes 2000 mcg 3 times a week ( monday, wednesday and friday)            Museum/gallery conservator  (From admission, onward)         Start     Ordered   04/11/20 1349  DME Oxygen  Once    Question Answer Comment  Length of Need Lifetime   Mode or (Route) Nasal cannula   Liters per Minute 2   Frequency Continuous (stationary and portable oxygen unit needed)   Oxygen conserving  device No   Oxygen delivery system Gas      04/11/20 1348          No Known Allergies   Procedures/Studies: CT ABDOMEN PELVIS WO CONTRAST  Result Date: 04/08/2020 CLINICAL DATA:  Vomiting. Facial and left hand swelling. EXAM: CT ABDOMEN AND PELVIS WITHOUT CONTRAST TECHNIQUE: Multidetector CT imaging of the abdomen and pelvis was performed following the standard protocol without IV contrast. COMPARISON:  08/05/2017 FINDINGS: Lower chest: Small right pleural effusion. Consolidation/atelectasis posteriorly at the right lung base. Coronary calcifications. Hepatobiliary: Stable small hypodense lesion presumably cysts. No new lesion. No gallstones, gallbladder wall thickening, or biliary dilatation. Pancreas: Unremarkable. No pancreatic ductal dilatation or surrounding inflammatory changes. Spleen: Normal in size without focal abnormality. Adrenals/Urinary Tract: 1.5 cm left adrenal nodule as before. Left kidney unremarkable. Previous right nephrectomy. Stomach/Bowel: Stomach nondistended. Small bowel decompressed. Colon is nondilated with scattered diverticula. Vascular/Lymphatic: Moderate calcified aortoiliac atheromatous plaque without aneurysm. No retroperitoneal, abdominal, or pelvic adenopathy. Reproductive: Calcified uterine fibroids. 4  cm left cystic adnexal process, previously 3.1 cm on 06/14/2016. Smaller right adnexal cyst. Other: No ascites. No free air. Musculoskeletal: Small ventral hernia containing only mesenteric fat. Stable L3 superior endplate compression deformity. No acute fracture or worrisome bone lesion. IMPRESSION: 1. Small right pleural effusion with adjacent consolidation/atelectasis at the right lung base. 2. Stable left adrenal nodule, likely adenoma. 3. Stable 4 cm left cystic adnexal process, previously 3.1 cm on 06/14/2016. Consider elective outpatient pelvic ultrasound for further characterization. 4. Colonic diverticulosis. 5. Uterine fibroids. 6. Small ventral hernia  containing only mesenteric fat. Aortic Atherosclerosis (ICD10-I70.0). Electronically Signed   By: Lucrezia Europe M.D.   On: 04/08/2020 13:36   NM Pulmonary Perfusion  Result Date: 04/09/2020 CLINICAL DATA:  PE suspected, low/intermediate prob, positive D-dimer EXAM: NUCLEAR MEDICINE PERFUSION LUNG SCAN TECHNIQUE: Perfusion images were obtained in multiple projections after intravenous injection of radiopharmaceutical. Ventilation scans intentionally deferred if perfusion scan and chest x-ray adequate for interpretation during COVID 19 epidemic. RADIOPHARMACEUTICALS:  1.6 mCi Tc-38m MAA IV COMPARISON:  Chest radiograph earlier this day. Lung bases from abdominal CT yesterday. FINDINGS: Slight heterogeneous perfusion to both lungs without segmental perfusion defect. IMPRESSION: Mild heterogeneous pulmonary perfusion consistent with low probability of pulmonary embolus. No segmental perfusion defects. Electronically Signed   By: Keith Rake M.D.   On: 04/09/2020 17:35   DG CHEST PORT 1 VIEW  Result Date: 04/09/2020 CLINICAL DATA:  Suspected PE. For V/Q correlation. EXAM: PORTABLE CHEST 1 VIEW COMPARISON:  None. FINDINGS: Heart size is normal. Aortic atherosclerosis. Chronic interstitial lung markings again demonstrated. These are questionably more prominent raising the possibility of mild edema or early pneumonia. No consolidation, collapse or visible effusion. IMPRESSION: Chronic lung markings, possibly slightly more prominent than seen yesterday raising the possibility early edema or pneumonia. Electronically Signed   By: Nelson Chimes M.D.   On: 04/09/2020 16:34   DG Chest Port 1 View  Result Date: 04/08/2020 CLINICAL DATA:  79 year old female with cough and vomiting for 2 days. Face and left hand swelling. EXAM: PORTABLE CHEST 1 VIEW COMPARISON:  Chest radiographs 08/02/2017 and earlier. FINDINGS: Portable AP upright view at 1115 hours. Mildly lower lung volumes. Mediastinal contours are stable and  within normal limits. Visualized tracheal air column is within normal limits. Allowing for portable technique the lungs are clear. No pneumothorax. Normal visible bowel gas pattern. No pneumoperitoneum identified. No acute osseous abnormality identified. IMPRESSION: Negative portable chest. Electronically Signed   By: Genevie Ann M.D.   On: 04/08/2020 11:21   MM CLIP PLACEMENT RIGHT  Result Date: 03/14/2020 CLINICAL DATA:  Status post stereotactic guided core biopsy 2 sites of calcifications in the UPPER-OUTER QUADRANT RIGHT breast. EXAM: DIAGNOSTIC RIGHT MAMMOGRAM POST STEREOTACTIC BIOPSY x2 COMPARISON:  Previous exam(s). FINDINGS: Mammographic images were obtained following stereotactic guided biopsy of calcifications superior UPPER OUTER QUADRANT of the RIGHT breast and placement of a coil shaped clip. The biopsy marking clip is in expected position at the site of biopsy. Following biopsy of smooth linear calcifications in the inferior site UPPER-OUTER QUADRANT, an X shaped clip was placed and is identified within the expected location adjacent to residual calcifications. IMPRESSION: Tissue marker clips are in the expected locations after biopsy. Final Assessment: Post Procedure Mammograms for Marker Placement Electronically Signed   By: Nolon Nations M.D.   On: 03/14/2020 10:08   MM RT BREAST BX W LOC DEV 1ST LESION IMAGE BX SPEC STEREO GUIDE  Addendum Date: 03/18/2020   ADDENDUM REPORT: 03/18/2020 12:50  ADDENDUM: Pathology revealed LOBULAR NEOPLASIA (ATYPICAL LOBULAR HYPERPLASIA), FIBROADENOMA WITH CALCIFICATIONS of the RIGHT breast, upper outer quadrant, superior, coil clip. This was found to be concordant by Dr. Nolon Nations, with excision recommended. Pathology revealed FIBROCYSTIC CHANGE WITH CALCIFICATIONS of the RIGHT breast, upper outer inferior, X clip. This was found to be concordant by Dr. Nolon Nations. Pathology results were discussed with the patient by telephone. The patient reported  doing well after the biopsies with tenderness at the sites. Post biopsy instructions and care were reviewed and questions were answered. The patient was encouraged to call [facility] for any additional concerns. Surgical consultation has been arranged with Dr. Donnie Mesa at [facility] on April 08, 2020. Pathology results reported by Stacie Acres RN on 03/18/2020. Electronically Signed   By: Nolon Nations M.D.   On: 03/18/2020 12:50   Result Date: 03/18/2020 CLINICAL DATA:  Patient presents for stereotactic guided core biopsy of 2 sites of calcifications in the RIGHT breast. EXAM: RIGHT BREAST STEREOTACTIC CORE NEEDLE BIOPSY x2 COMPARISON:  Previous exams. FINDINGS: The patient and I discussed the procedure of stereotactic-guided biopsy including benefits and alternatives. We discussed the high likelihood of a successful procedure. We discussed the risks of the procedure including infection, bleeding, tissue injury, clip migration, and inadequate sampling. Informed written consent was given. The usual time out protocol was performed immediately prior to the procedure. Site 1: UPPER-OUTER QUADRANT RIGHT, superior group of amorphous calcifications, coil clip: Using sterile technique and 1% lidocaine and 1% lidocaine with epinephrine as local anesthetic, under stereotactic guidance, a 9 gauge vacuum assisted device was used to perform core needle biopsy of calcifications in the UPPER-OUTER QUADRANT of the RIGHT breast, superiorly site using a craniocaudal approach. Specimen radiograph was performed showing calcifications in numerous specimens. Specimens with calcifications are identified for pathology. Lesion quadrant: UPPER-OUTER QUADRANT RIGHT breast At the conclusion of the procedure, a coil shaped tissue marker clip was deployed into the biopsy cavity. Site 2: UPPER-OUTER QUADRANT RIGHT breast, group of smooth linear calcifications, X clip: Using sterile technique and  1% lidocaine and 1% lidocaine with epinephrine as local anesthetic, under stereotactic guidance, a 9 gauge vacuum assisted device was used to perform core needle biopsy of calcifications in the UPPER-OUTER QUADRANT of the RIGHT breast, more inferior group using a craniocaudal approach. Specimen radiograph was performed showing calcifications and numerous specimen. Specimens with calcifications are identified for pathology. Lesion quadrant: UPPER-OUTER QUADRANT RIGHT breast At the conclusion of the procedure, X shaped tissue marker clip was deployed into the biopsy cavity. Follow-up 2-view mammogram was performed and dictated separately. IMPRESSION: Stereotactic-guided biopsy of 2 sites of calcifications in the UPPER-OUTER QUADRANT RIGHT breast. No apparent complications. Electronically Signed: By: Nolon Nations M.D. On: 03/14/2020 10:06   MM RT BREAST BX W LOC DEV EA AD LESION IMG BX SPEC STEREO GUIDE  Addendum Date: 03/18/2020   ADDENDUM REPORT: 03/18/2020 12:50 ADDENDUM: Pathology revealed LOBULAR NEOPLASIA (ATYPICAL LOBULAR HYPERPLASIA), FIBROADENOMA WITH CALCIFICATIONS of the RIGHT breast, upper outer quadrant, superior, coil clip. This was found to be concordant by Dr. Nolon Nations, with excision recommended. Pathology revealed FIBROCYSTIC CHANGE WITH CALCIFICATIONS of the RIGHT breast, upper outer inferior, X clip. This was found to be concordant by Dr. Nolon Nations. Pathology results were discussed with the patient by telephone. The patient reported doing well after the biopsies with tenderness at the sites. Post biopsy instructions and care were reviewed and questions were answered. The patient was encouraged to  call [facility] for any additional concerns. Surgical consultation has been arranged with Dr. Donnie Mesa at [facility] on April 08, 2020. Pathology results reported by Stacie Acres RN on 03/18/2020. Electronically Signed   By: Nolon Nations  M.D.   On: 03/18/2020 12:50   Result Date: 03/18/2020 CLINICAL DATA:  Patient presents for stereotactic guided core biopsy of 2 sites of calcifications in the RIGHT breast. EXAM: RIGHT BREAST STEREOTACTIC CORE NEEDLE BIOPSY x2 COMPARISON:  Previous exams. FINDINGS: The patient and I discussed the procedure of stereotactic-guided biopsy including benefits and alternatives. We discussed the high likelihood of a successful procedure. We discussed the risks of the procedure including infection, bleeding, tissue injury, clip migration, and inadequate sampling. Informed written consent was given. The usual time out protocol was performed immediately prior to the procedure. Site 1: UPPER-OUTER QUADRANT RIGHT, superior group of amorphous calcifications, coil clip: Using sterile technique and 1% lidocaine and 1% lidocaine with epinephrine as local anesthetic, under stereotactic guidance, a 9 gauge vacuum assisted device was used to perform core needle biopsy of calcifications in the UPPER-OUTER QUADRANT of the RIGHT breast, superiorly site using a craniocaudal approach. Specimen radiograph was performed showing calcifications in numerous specimens. Specimens with calcifications are identified for pathology. Lesion quadrant: UPPER-OUTER QUADRANT RIGHT breast At the conclusion of the procedure, a coil shaped tissue marker clip was deployed into the biopsy cavity. Site 2: UPPER-OUTER QUADRANT RIGHT breast, group of smooth linear calcifications, X clip: Using sterile technique and 1% lidocaine and 1% lidocaine with epinephrine as local anesthetic, under stereotactic guidance, a 9 gauge vacuum assisted device was used to perform core needle biopsy of calcifications in the UPPER-OUTER QUADRANT of the RIGHT breast, more inferior group using a craniocaudal approach. Specimen radiograph was performed showing calcifications and numerous specimen. Specimens with calcifications are identified for pathology. Lesion quadrant: UPPER-OUTER  QUADRANT RIGHT breast At the conclusion of the procedure, X shaped tissue marker clip was deployed into the biopsy cavity. Follow-up 2-view mammogram was performed and dictated separately. IMPRESSION: Stereotactic-guided biopsy of 2 sites of calcifications in the UPPER-OUTER QUADRANT RIGHT breast. No apparent complications. Electronically Signed: By: Nolon Nations M.D. On: 03/14/2020 10:06   VAS [place] LOWER EXTREMITY VENOUS (DVT)  Result Date: 04/10/2020  Lower Venous DVTStudy Indications: Positive d-dimer.  Comparison Study: No priors. Performing Technologist: Oda Cogan RDMS, RVT  Examination Guidelines: A complete evaluation includes B-mode imaging, spectral Doppler, color Doppler, and power Doppler as needed of all accessible portions of each vessel. Bilateral testing is considered an integral part of a complete examination. Limited examinations for reoccurring indications may be performed as noted. The reflux portion of the exam is performed with the patient in reverse Trendelenburg.  +---------+---------------+---------+-----------+----------+--------------+ RIGHT    CompressibilityPhasicitySpontaneityPropertiesThrombus Aging +---------+---------------+---------+-----------+----------+--------------+ CFV      Full           Yes      Yes                                 +---------+---------------+---------+-----------+----------+--------------+ SFJ      Full                                                        +---------+---------------+---------+-----------+----------+--------------+ FV Prox  Full                                                        +---------+---------------+---------+-----------+----------+--------------+ FV Mid   Full                                                        +---------+---------------+---------+-----------+----------+--------------+ FV DistalFull                                                         +---------+---------------+---------+-----------+----------+--------------+ PFV      Full                                                        +---------+---------------+---------+-----------+----------+--------------+ POP      Full           Yes      Yes                                 +---------+---------------+---------+-----------+----------+--------------+ PTV      Full                                                        +---------+---------------+---------+-----------+----------+--------------+ PERO     Full                                                        +---------+---------------+---------+-----------+----------+--------------+   +---------+---------------+---------+-----------+----------+--------------+ LEFT     CompressibilityPhasicitySpontaneityPropertiesThrombus Aging +---------+---------------+---------+-----------+----------+--------------+ CFV      Full           Yes      Yes                                 +---------+---------------+---------+-----------+----------+--------------+ SFJ      Full                                                        +---------+---------------+---------+-----------+----------+--------------+ FV Prox  Full                                                        +---------+---------------+---------+-----------+----------+--------------+  FV Mid   Full                                                        +---------+---------------+---------+-----------+----------+--------------+ FV DistalFull                                                        +---------+---------------+---------+-----------+----------+--------------+ PFV      Full                                                        +---------+---------------+---------+-----------+----------+--------------+ POP      Full           Yes      Yes                                  +---------+---------------+---------+-----------+----------+--------------+ PTV      Full                                                        +---------+---------------+---------+-----------+----------+--------------+ PERO     Full                                                        +---------+---------------+---------+-----------+----------+--------------+     Summary: RIGHT: - There is no evidence of deep vein thrombosis in the lower extremity.  - No cystic structure found in the popliteal fossa.  LEFT: - There is no evidence of deep vein thrombosis in the lower extremity.  - No cystic structure found in the popliteal fossa.  *See table(s) above for measurements and observations. Electronically signed by Servando Snare MD on 04/10/2020 at 8:23:21 PM.    Final     Subjective: No acute issues or events overnight, patient feels back to baseline, denies any dyspnea with exertion while wearing oxygen, otherwise declines chest pain, nausea, vomiting, diarrhea, constipation, headache, fevers, chills.   Discharge Exam: Vitals:   04/11/20 0435 04/11/20 1146  BP: (!) 173/89   Pulse: 92   Resp: 17   Temp: 98.6 F (37 C)   SpO2: 100% 97%   Vitals:   04/10/20 1542 04/10/20 2107 04/11/20 0435 04/11/20 1146  BP: (!) 176/58 (!) 178/60 (!) 173/89   Pulse: 87 87 92   Resp: 17 18 17    Temp: 98.8 F (37.1 C) 99.2 F (37.3 C) 98.6 F (37 C)   TempSrc: Oral Oral Oral   SpO2: (!) 71% 96% 100% 97%  Weight:      Height:        General:  Pleasantly resting in bed, No acute distress. HEENT:  Normocephalic atraumatic.  Sclerae nonicteric, noninjected.  Extraocular movements intact bilaterally. Neck:  Without mass or deformity.  Trachea is midline. Lungs: Bilaterally without overt wheezes rales or rhonchi Heart:  Regular rate and rhythm.  Without murmurs, rubs, or gallops. Abdomen:  Soft, nontender, nondistended.  Without guarding or rebound. Extremities: Without cyanosis, clubbing, or  obvious deformity. Vascular:  Dorsalis pedis and posterior tibial pulses palpable bilaterally. Skin:  Warm and dry, no erythema, no ulcerations.   The results of significant diagnostics from this hospitalization (including imaging, microbiology, ancillary and laboratory) are listed below for reference.     Microbiology: Recent Results (from the past 240 hour(s))  Respiratory Panel by RT PCR (Flu A&B, Covid) - Nasopharyngeal Swab     Status: None   Collection Time: 04/08/20  2:17 PM   Specimen: Nasopharyngeal Swab  Result Value Ref Range Status   SARS Coronavirus 2 by RT PCR NEGATIVE NEGATIVE Final    Comment: (NOTE) SARS-CoV-2 target nucleic acids are NOT DETECTED. The SARS-CoV-2 RNA is generally detectable in upper respiratoy specimens during the acute phase of infection. The lowest concentration of SARS-CoV-2 viral copies this assay can detect is 131 copies/mL. A negative result does not preclude SARS-Cov-2 infection and should not be used as the sole basis for treatment or other patient management decisions. A negative result may occur with  improper specimen collection/handling, submission of specimen other than nasopharyngeal swab, presence of viral mutation(s) within the areas targeted by this assay, and inadequate number of viral copies (<131 copies/mL). A negative result must be combined with clinical observations, patient history, and epidemiological information. The expected result is Negative. Fact Sheet for Patients:  PinkCheek.be Fact Sheet for Healthcare Providers:  GravelBags.it This test is not yet ap proved or cleared by the [place] FDA and  has been authorized for detection and/or diagnosis of SARS-CoV-2 by FDA under an Emergency Use Authorization (EUA). This EUA will remain  in effect (meaning this test can be used) for the duration of the COVID-19 declaration under Section 564(b)(1) of the Act, 21  U.S.C. section 360bbb-3(b)(1), unless the authorization is terminated or revoked sooner.    Influenza A by PCR NEGATIVE NEGATIVE Final   Influenza B by PCR NEGATIVE NEGATIVE Final    Comment: (NOTE) The Xpert Xpress SARS-CoV-2/FLU/RSV assay is intended as an aid in  the diagnosis of influenza from Nasopharyngeal swab specimens and  should not be used as a sole basis for treatment. Nasal washings and  aspirates are unacceptable for Xpert Xpress SARS-CoV-2/FLU/RSV  testing. Fact Sheet for Patients: PinkCheek.be Fact Sheet for Healthcare Providers: GravelBags.it This test is not yet approved or cleared by the [place] FDA and  has been authorized for detection and/or diagnosis of SARS-CoV-2 by  FDA under an Emergency Use Authorization (EUA). This EUA will remain  in effect (meaning this test can be used) for the duration of the  Covid-19 declaration under Section 564(b)(1) of the Act, 21  U.S.C. section 360bbb-3(b)(1), unless the authorization is  terminated or revoked. Performed at [facility], [place] [address], [place], [place] [address]   Culture, blood (routine x 2)     Status: None (Preliminary result)   Collection Time: 04/08/20  4:40 PM   Specimen: Site Not Specified; Blood  Result Value Ref Range Status   Specimen Description SITE NOT SPECIFIED  Final   Special Requests   Final    BOTTLES DRAWN AEROBIC AND ANAEROBIC Blood Culture adequate volume   Culture  Final    NO GROWTH 3 DAYS Performed at [facility], [place] [address], [place], [place] [address]    Report Status PENDING  Incomplete  Culture, blood (routine x 2)     Status: None (Preliminary result)   Collection Time: 04/09/20  3:08 AM   Specimen: BLOOD LEFT ARM  Result Value Ref Range Status   Specimen Description BLOOD LEFT ARM  Final   Special Requests   Final    BOTTLES DRAWN AEROBIC ONLY Blood Culture results may not be optimal due to  an inadequate volume of blood received in culture bottles   Culture   Final    NO GROWTH 2 DAYS Performed at [facility], [place] [address], [place], [place] [address]    Report Status PENDING  Incomplete     Labs: BNP (last 3 results) No results for input(s): BNP in the last 8760 hours. Basic Metabolic Panel: Recent Labs  Lab 04/08/20 0635 04/09/20 0308 04/10/20 1434 04/11/20 0128  NA 135 134* 137 138  K 4.5 4.4 3.8 3.8  CL 104 105 108 112*  CO2 18* 20* 23 20*  GLUCOSE 231* 121* 115* 94  BUN 52* 40* 19 16  CREATININE 3.17* 2.15* 1.50* 1.32*  CALCIUM 8.5* 8.0* 7.8* 7.7*   Liver Function Tests: Recent Labs  Lab 04/08/20 0635 04/09/20 0308  AST 155* 102*  ALT 95* 75*  ALKPHOS 41 33*  BILITOT 1.1 1.3*  PROT 4.9* 4.4*  ALBUMIN 2.6* 2.3*   Recent Labs  Lab 04/08/20 0635  LIPASE 65*   No results for input(s): AMMONIA in the last 168 hours. CBC: Recent Labs  Lab 04/08/20 0635 04/09/20 0308 04/09/20 1549 04/10/20 1434 04/11/20 0128  WBC 16.7* 20.8*  --  16.0* 12.7*  HGB 17.2* 14.3  --  13.5 12.1  HCT 51.4* 41.5  --  40.7 37.2  MCV 88.0 88.1  --  89.1 89.6  PLT 126* 74* 72* 86* 92*   Cardiac Enzymes: Recent Labs  Lab 04/08/20 1640  CKTOTAL 116   BNP: Invalid input(s): POCBNP CBG: Recent Labs  Lab 04/10/20 1129 04/10/20 1642 04/10/20 2109 04/11/20 0742 04/11/20 1249  GLUCAP 90 78 87 83 128*   D-Dimer Recent Labs    04/09/20 0806 04/09/20 1549  DDIMER 3.12* 2.66*   Hgb A1c Recent Labs    04/08/20 1640  HGBA1C 5.6   Lipid Profile No results for input(s): CHOL, HDL, LDLCALC, TRIG, CHOLHDL, LDLDIRECT in the last 72 hours. Thyroid function studies Recent Labs    04/08/20 2230  TSH 0.482   Anemia work up No results for input(s): VITAMINB12, FOLATE, FERRITIN, TIBC, IRON, RETICCTPCT in the last 72 hours. Urinalysis    Component Value Date/Time   COLORURINE YELLOW 04/08/2020 1203   APPEARANCEUR HAZY (A) 04/08/2020 1203    LABSPEC 1.020 04/08/2020 1203   PHURINE 5.0 04/08/2020 1203   GLUCOSEU NEGATIVE 04/08/2020 1203   HGBUR NEGATIVE 04/08/2020 1203   [place] 04/08/2020 1203   KETONESUR NEGATIVE 04/08/2020 1203   PROTEINUR 30 (A) 04/08/2020 1203   UROBILINOGEN 0.2 08/03/2009 1940   NITRITE NEGATIVE 04/08/2020 1203   LEUKOCYTESUR NEGATIVE 04/08/2020 1203   Sepsis Labs Invalid input(s): PROCALCITONIN,  WBC,  LACTICIDVEN Microbiology Recent Results (from the past 240 hour(s))  Respiratory Panel by RT PCR (Flu A&B, Covid) - Nasopharyngeal Swab     Status: None   Collection Time: 04/08/20  2:17 PM   Specimen: Nasopharyngeal Swab  Result Value Ref Range Status  SARS Coronavirus 2 by RT PCR NEGATIVE NEGATIVE Final    Comment: (NOTE) SARS-CoV-2 target nucleic acids are NOT DETECTED. The SARS-CoV-2 RNA is generally detectable in upper respiratoy specimens during the acute phase of infection. The lowest concentration of SARS-CoV-2 viral copies this assay can detect is 131 copies/mL. A negative result does not preclude SARS-Cov-2 infection and should not be used as the sole basis for treatment or other patient management decisions. A negative result may occur with  improper specimen collection/handling, submission of specimen other than nasopharyngeal swab, presence of viral mutation(s) within the areas targeted by this assay, and inadequate number of viral copies (<131 copies/mL). A negative result must be combined with clinical observations, patient history, and epidemiological information. The expected result is Negative. Fact Sheet for Patients:  PinkCheek.be Fact Sheet for Healthcare Providers:  GravelBags.it This test is not yet ap proved or cleared by the [place] FDA and  has been authorized for detection and/or diagnosis of SARS-CoV-2 by FDA under an Emergency Use Authorization (EUA). This EUA will remain  in effect  (meaning this test can be used) for the duration of the COVID-19 declaration under Section 564(b)(1) of the Act, 21 U.S.C. section 360bbb-3(b)(1), unless the authorization is terminated or revoked sooner.    Influenza A by PCR NEGATIVE NEGATIVE Final   Influenza B by PCR NEGATIVE NEGATIVE Final    Comment: (NOTE) The Xpert Xpress SARS-CoV-2/FLU/RSV assay is intended as an aid in  the diagnosis of influenza from Nasopharyngeal swab specimens and  should not be used as a sole basis for treatment. Nasal washings and  aspirates are unacceptable for Xpert Xpress SARS-CoV-2/FLU/RSV  testing. Fact Sheet for Patients: PinkCheek.be Fact Sheet for Healthcare Providers: GravelBags.it This test is not yet approved or cleared by the [place] FDA and  has been authorized for detection and/or diagnosis of SARS-CoV-2 by  FDA under an Emergency Use Authorization (EUA). This EUA will remain  in effect (meaning this test can be used) for the duration of the  Covid-19 declaration under Section 564(b)(1) of the Act, 21  U.S.C. section 360bbb-3(b)(1), unless the authorization is  terminated or revoked. Performed at [facility], [place] [address], [place], [place] [address]   Culture, blood (routine x 2)     Status: None (Preliminary result)   Collection Time: 04/08/20  4:40 PM   Specimen: Site Not Specified; Blood  Result Value Ref Range Status   Specimen Description SITE NOT SPECIFIED  Final   Special Requests   Final    BOTTLES DRAWN AEROBIC AND ANAEROBIC Blood Culture adequate volume   Culture   Final    NO GROWTH 3 DAYS Performed at [facility], [place] [address] [place], [place] [address]    Report Status PENDING  Incomplete  Culture, blood (routine x 2)     Status: None (Preliminary result)   Collection Time: 04/09/20  3:08 AM   Specimen: BLOOD LEFT ARM  Result Value Ref Range Status   Specimen Description BLOOD LEFT  ARM  Final   Special Requests   Final    BOTTLES DRAWN AEROBIC ONLY Blood Culture results may not be optimal due to an inadequate volume of blood received in culture bottles   Culture   Final    NO GROWTH 2 DAYS Performed at [facility], [place] [address] [place], [place] [address]    Report Status PENDING  Incomplete     Time coordinating discharge: Over 30 minutes  SIGNED:   Little Ishikawa, DO [facility] 04/11/2020, 1:48 PM Pager   If 7PM-7AM, please contact night-coverage [email or web address]

## 2020-04-11 NOTE — Plan of Care (Signed)

## 2020-04-11 NOTE — Evaluation (Signed)
Occupational Therapy Evaluation Patient Details Name: Cassandra Dyer MRN: [id number] DOB: 04/25/1941 Today's Date: 04/11/2020    History of Present Illness Pt is a 79 y/o female admitted secondary to sepsis and ? RLL PNA. PMH includes R kidney cancer s/p nephrectomy, hep C, and HTN.    Clinical Impression   PTA patient independent and driving. Admitted for above and limited by problem list below, including decreased activity tolerance and endurance.  Patient completing ADLs and transfers, in room mobility with supervision, on 3L supplemental O2 via [place] but noted SpO2 desaturation to 82% with minimal self care tasks (grooming at sink) with cueing for PLB techniques.  Initated education on energy conservation techniques, will benefit from further reinforcement.  Will follow acutely to optimize independence, safety and tolerance to ADLs but anticipate no further needs after dc home.     Follow Up Recommendations  No OT follow up;Supervision - Intermittent    Equipment Recommendations  Tub/shower seat    Recommendations for Other Services       Precautions / Restrictions Precautions Precautions: Other (comment) Precaution Comments: Watch O2 sats.  Restrictions Weight Bearing Restrictions: No      Mobility Bed Mobility Overal bed mobility: Modified Independent             General bed mobility comments: HOB elevated but no assist required  Transfers Overall transfer level: Needs assistance Equipment used: None Transfers: Sit to/from Stand Sit to Stand: Supervision         General transfer comment: for line mgmt     Balance Overall balance assessment: No apparent balance deficits (not formally assessed)                                         ADL either performed or assessed with clinical judgement   ADL Overall ADL's : Needs assistance/impaired     Grooming: Supervision/safety;Standing   Upper Body Bathing: Set up;Sitting   Lower Body  Bathing: Supervison/ safety;Sit to/from stand   Upper Body Dressing : Set up;Sitting   Lower Body Dressing: Supervision/safety;Sit to/from stand   Toilet Transfer: Supervision/safety;Ambulation   Toileting- Clothing Manipulation and Hygiene: Supervision/safety;Sit to/from stand       Functional mobility during ADLs: Supervision/safety General ADL Comments: pt limited by decreased activity tolerance and endurance, noted on 3L supplemental O2 via [place] with SPO2 desaturation to 82% while grooming at sink     Vision         Perception     Praxis      Pertinent Vitals/Pain Pain Assessment: No/denies pain     Hand Dominance Right   Extremity/Trunk Assessment Upper Extremity Assessment Upper Extremity Assessment: Overall WFL for tasks assessed   Lower Extremity Assessment Lower Extremity Assessment: Defer to PT evaluation   Cervical / Trunk Assessment Cervical / Trunk Assessment: Normal   Communication Communication Communication: No difficulties   Cognition Arousal/Alertness: Awake/alert Behavior During Therapy: WFL for tasks assessed/performed Overall Cognitive Status: Within Functional Limits for tasks assessed                                     General Comments  initated education on energy conservation, PLB; SpO2 decreased to 82% on 3L supplemental O2 with minimal activity     Exercises     Shoulder Instructions  Home Living Family/patient expects to be discharged to:: Private residence Living Arrangements: Spouse/significant other(boyfriend) Available Help at Discharge: Family;Available 24 hours/day Type of Home: House Home Access: Level entry     Home Layout: One level     Bathroom Shower/Tub: Teacher, early years/pre: Standard     Home Equipment: None          Prior Functioning/Environment Level of Independence: Independent                 OT Problem List: Decreased activity tolerance;Decreased safety  awareness;Decreased knowledge of precautions;Decreased knowledge of use of DME or AE;Cardiopulmonary status limiting activity      OT Treatment/Interventions: Self-care/ADL training;DME and/or AE instruction;Therapeutic activities;Patient/family education;Energy conservation    OT Goals(Current goals can be found in the care plan section) Acute Rehab OT Goals Patient Stated Goal: to go home OT Goal Formulation: With patient Time For Goal Achievement: 04/25/20 Potential to Achieve Goals: Good  OT Frequency: Min 2X/week   Barriers to D/C:            Co-evaluation              AM-PAC OT "6 Clicks" Daily Activity     Outcome Measure Help from another person eating meals?: None Help from another person taking care of personal grooming?: A Little Help from another person toileting, which includes using toliet, bedpan, or urinal?: A Little Help from another person bathing (including washing, rinsing, drying)?: A Little Help from another person to put on and taking off regular upper body clothing?: A Little Help from another person to put on and taking off regular lower body clothing?: A Little 6 Click Score: 19   End of Session Equipment Utilized During Treatment: Oxygen(3L) Nurse Communication: Mobility status;Other (comment);Precautions(O2)  Activity Tolerance: Patient tolerated treatment well Patient left: with call bell/phone within reach;in chair  OT Visit Diagnosis: Other (comment)(decreased activity tolerance)                Time: 6237-6283 OT Time Calculation (min): 26 min Charges:  OT General Charges $OT Visit: 1 Visit OT Evaluation $OT Eval Moderate Complexity: 1 Mod OT Treatments $Self Care/Home Management : 8-22 mins  Jolaine Artist, OT Acute Rehabilitation Services Pager [phone] Office [phone]   Delight Stare 04/11/2020, 10:14 AM

## 2020-04-11 NOTE — Progress Notes (Signed)
Cassandra Dyer to be D/C'd  per MD order. Discussed with the patient and all questions fully answered.  VSS, Skin clean, dry and intact without evidence of skin break down, no evidence of skin tears noted.  IV catheter discontinued intact. Site without signs and symptoms of complications. Dressing and pressure applied.  An After Visit Summary was printed and given to the patient. Patient received prescription.  D/c education completed with patient/family including follow up instructions, medication list, d/c activities limitations if indicated, with other d/c instructions as indicated by MD - patient able to verbalize understanding, all questions fully answered.   Patient instructed to return to ED, call 911, or call MD for any changes in condition.   Patient to be escorted via [place], and D/C home via private auto.

## 2020-04-11 NOTE — Progress Notes (Signed)
SATURATION QUALIFICATIONS: (This note is used to comply with regulatory documentation for home oxygen)  Patient Saturations on Room Air at Rest =90%  Patient Saturations on Room Air while Ambulating = 81%  Patient Saturations on2 Liters of oxygen while Ambulating = 91%  Please briefly explain why patient needs home oxygen:

## 2020-04-11 NOTE — TOC Initial Note (Signed)
Transition of Care [facility]) - Initial/Assessment Note    Patient Details  Name: Cassandra Dyer MRN: [id number] Date of Birth: 08-23-1941  Transition of Care [facility]) CM/SW Contact:    Marilu Favre, RN Phone Number: 04/11/2020, 10:53 AM  Clinical Narrative:                  Patient from home with boyfriend. Discussed home oxygen. [employer] [facility] will bring portable oxygen tank to room prior to discharge and arrange for delivery of home oxygen DME.    Patient has transportation home  Expected Discharge Plan: Home/Self Care Barriers to Discharge: Continued Medical Work up(oxygen needs)   Patient Goals and CMS Choice Patient states their goals for this hospitalization and ongoing recovery are:: to go home CMS Medicare.gov Compare Post Acute Care list provided to:: Patient Choice offered to / list presented to : Patient  Expected Discharge Plan and Services Expected Discharge Plan: Home/Self Care   Discharge Planning Services: CM Consult Post Acute Care Choice: Durable Medical Equipment Living arrangements for the past 2 months: Single Family Home                 DME Arranged: Oxygen DME Agency: [employer] Date DME Agency Contacted: 04/11/20 Time DME Agency Contacted: 1053 Representative spoke with at DME Agency: [place]: NA          Prior Living Arrangements/Services Living arrangements for the past 2 months: Single Family Home Lives with:: Significant Other Patient language and need for interpreter reviewed:: Yes        Need for Family Participation in Patient Care: Yes (Comment) Care giver support system in place?: Yes (comment)   Criminal Activity/Legal Involvement Pertinent to Current Situation/Hospitalization: No - Comment as needed  Activities of Daily Living Home Assistive Devices/Equipment: Eyeglasses ADL Screening (condition at time of admission) Patient's cognitive ability adequate to safely complete daily activities?: Yes Is the patient deaf or  have difficulty hearing?: No Does the patient have difficulty seeing, even when wearing glasses/contacts?: No Does the patient have difficulty concentrating, remembering, or making decisions?: No Patient able to express need for assistance with ADLs?: Yes Does the patient have difficulty dressing or bathing?: No Independently performs ADLs?: Yes (appropriate for developmental age) Does the patient have difficulty walking or climbing stairs?: Yes Weakness of Legs: Both Weakness of Arms/Hands: None  Permission Sought/Granted   Permission granted to share information with : No              Emotional Assessment   Attitude/Demeanor/Rapport: Engaged Affect (typically observed): Accepting Orientation: : Oriented to Self, Oriented to Place, Oriented to  Time, Oriented to Situation Alcohol / Substance Use: Not Applicable Psych Involvement: No (comment)  Admission diagnosis:  Dehydration [E86.0] AKI (acute kidney injury) ([place]) [N17.9] Sepsis ([place]) [A41.9] Community acquired pneumonia of right lower lobe of lung [J18.9] Patient Active Problem List   Diagnosis Date Noted  . Pneumonia 04/09/2020  . Acute renal failure ([place]) 04/09/2020  . Hypertensive urgency 04/09/2020  . History of hepatitis C 04/09/2020  . Sepsis ([place]) 04/08/2020  . Venous insufficiency 12/28/2019  . Hyperglycemia 06/29/2019  . Osteoporosis 02/20/2018  . Essential hypertension 02/15/2017  . Dyslipidemia 02/15/2017  . Renal cell carcinoma of right kidney ([place]) 02/15/2017   PCP:  Biagio Borg, MD Pharmacy:   [facility] ([place]), [place] - [address] [place] ([place]) [place] [address] Phone: [phone] Fax: [phone]     Social Determinants of  Health (SDOH) Interventions    Readmission Risk Interventions No flowsheet data found.

## 2020-04-13 LAB — CULTURE, BLOOD (ROUTINE X 2)
Culture: NO GROWTH
Special Requests: ADEQUATE

## 2020-04-14 LAB — CULTURE, BLOOD (ROUTINE X 2): Culture: NO GROWTH

## 2020-04-15 ENCOUNTER — Telehealth: Payer: Self-pay | Admitting: *Deleted

## 2020-04-15 NOTE — Telephone Encounter (Signed)
Transition Care Management Follow-up Telephone Call  Date discharged? 04/11/20   How have you been since you were released from the hospital? Pt states she is doing ok, but still having some swelling in her feet & ankles.    Do you understand why you were in the hospital? YES   Do you understand the discharge instructions? YES   Where were you discharged to? Home   Items Reviewed:  Medications reviewed: YES, she states she has completed the Zpac that was given  Allergies reviewed: YES  Dietary changes reviewed: YES, pt states she still don't have an appetite like she should  Referrals reviewed: No referral needed   Functional Questionnaire:   Activities of Daily Living (ADLs):   She states she are independent in the following: ambulation, bathing and hygiene, feeding, continence, grooming, toileting and dressing States she doesn't require assistance    Any transportation issues/concerns?: NO   Any patient concerns? NO   Confirmed importance and date/time of follow-up visits scheduled YES, APPT 04/19/20  Provider Appointment booked with Dr. Jenny Reichmann   Confirmed with patient if condition begins to worsen call PCP or go to the ER.  Patient was given the office number and encouraged to call back with question or concerns.  : YES

## 2020-04-18 ENCOUNTER — Other Ambulatory Visit: Payer: Self-pay | Admitting: *Deleted

## 2020-04-18 NOTE — Patient Outreach (Signed)
[facility]) Care Management  04/18/2020  ZENOVIA JUSTMAN 08/24/1941 [id number]   RED ON EMMI ALERT - General Discharge Day # 4 Date: 5/5 Red Alert Reason: Don't know who to call with changes in condition, Lost interest in things, and Other problems/questions   Outreach attempt #1, unsuccessful to home number, unable to leave a message.  Call also placed to listed cell number, HIPAA compliant voice message left.   Plan: RN CM will send unsuccessful outreach letter and follow up within the next 3-4 business days.  Valente David, [place], MSN [place] [phone]

## 2020-04-19 ENCOUNTER — Ambulatory Visit (INDEPENDENT_AMBULATORY_CARE_PROVIDER_SITE_OTHER): Payer: Medicare Other | Admitting: Internal Medicine

## 2020-04-19 ENCOUNTER — Other Ambulatory Visit: Payer: Self-pay

## 2020-04-19 ENCOUNTER — Encounter: Payer: Self-pay | Admitting: Internal Medicine

## 2020-04-19 VITALS — BP 220/110 | HR 110 | Temp 98.7°F | Ht 62.0 in | Wt 163.0 lb

## 2020-04-19 DIAGNOSIS — I160 Hypertensive urgency: Secondary | ICD-10-CM

## 2020-04-19 DIAGNOSIS — R739 Hyperglycemia, unspecified: Secondary | ICD-10-CM

## 2020-04-19 DIAGNOSIS — J9601 Acute respiratory failure with hypoxia: Secondary | ICD-10-CM | POA: Diagnosis not present

## 2020-04-19 DIAGNOSIS — I10 Essential (primary) hypertension: Secondary | ICD-10-CM

## 2020-04-19 DIAGNOSIS — R112 Nausea with vomiting, unspecified: Secondary | ICD-10-CM | POA: Diagnosis not present

## 2020-04-19 DIAGNOSIS — J189 Pneumonia, unspecified organism: Secondary | ICD-10-CM

## 2020-04-19 DIAGNOSIS — R197 Diarrhea, unspecified: Secondary | ICD-10-CM

## 2020-04-19 MED ORDER — METOPROLOL SUCCINATE ER 100 MG PO TB24
100.0000 mg | ORAL_TABLET | Freq: Every day | ORAL | 3 refills | Status: DC
Start: 2020-04-19 — End: 2020-08-08

## 2020-04-19 MED ORDER — ONDANSETRON HCL 4 MG PO TABS: 4.0000 mg | ORAL_TABLET | Freq: Three times a day (TID) | ORAL | 1 refills | Status: DC | PRN

## 2020-04-19 MED ORDER — HYDROCHLOROTHIAZIDE 25 MG PO TABS
25.0000 mg | ORAL_TABLET | Freq: Every day | ORAL | 3 refills | Status: DC
Start: 2020-04-19 — End: 2020-08-08

## 2020-04-19 MED ORDER — AMLODIPINE BESYLATE 10 MG PO TABS: 10.0000 mg | ORAL_TABLET | Freq: Every day | ORAL | 3 refills | Status: AC

## 2020-04-19 NOTE — Patient Instructions (Signed)
Please take all new medication as prescribed - the nausea medication as needed  You can also take Immodium as needed for diarrhea  OK to increase the HCT fluid pill to 25 mg per day  Please continue all other medications as before, and refills have been done if requested - the other 2 blood pressure pills, and the home oxygen at 2L for now  Please have the pharmacy call with any other refills you may need.  Please keep your appointments with your specialists as you may have planned  Please make an Appointment to return 1 week

## 2020-04-20 ENCOUNTER — Encounter: Payer: Self-pay | Admitting: Internal Medicine

## 2020-04-20 NOTE — Assessment & Plan Note (Signed)
Etiology unclear, for zofran prn, consider gi consult

## 2020-04-20 NOTE — Progress Notes (Signed)
Subjective:    Patient ID: Cassandra Dyer, female    DOB: 22-May-1941, 79 y.o.   MRN: [id number]  HPI  Here to f/u recent hospn 4/26-4/29 with pneumonia/sepsis with acute hypoxic respiratory fallure after presenting with 3 days n/v and decreased urine output, mild intermittent cough, some shortness of breath, swelling(legs and face), loose stools, and generalized malaise. COVID neg, also s/p vaccination.  May have undiagnosed underlying copd as well. Currently on o2 2L continous.   Pt denies chest pain, increased sob or doe, wheezing, orthopnea, PND, increased LE swelling, palpitations, dizziness or syncope, but has had one day of several episode n/v 3 days ago, with several small non bloody diarrhea as well.  Became nervous about her meds and has not taken much of her antHTN meds.  Did finish her post hosp 3 days azithromycin.  Today - Denies worsening reflux, abd pain, dysphagia, n/v, bowel change or blood.  Refuses to return to ed today with high blood pressure Past Medical History:  Diagnosis Date  . Anemia   . Hepatitis    hx of hep C - 10-20 years ago   . History of blood transfusion   . History of kidney cancer   . Hyperlipidemia   . Hypertension    not taken b/p med in 2-3 years md deceased never went to another md    Past Surgical History:  Procedure Laterality Date  . CATARACT EXTRACTION Bilateral    11/30 left /12/9 right  . ROBOT ASSISTED LAPAROSCOPIC NEPHRECTOMY Right 01/13/2017   Procedure: XI ROBOTIC ASSISTED LAPAROSCOPIC RADICAL NEPHRECTOMY WITH LYSIS OF ADHESION;  Surgeon: Alexis Frock, MD;  Location: [facility];  Service: Urology;  Laterality: Right;    reports that she quit smoking about 19 years ago. Her smoking use included cigarettes. She has a 15.00 pack-year smoking history. She has never used smokeless tobacco. She reports current alcohol use. She reports that she does not use drugs. family history includes Breast cancer in her sister; Hypertension in her mother;  Pancreatic cancer in her father. No Known Allergies Current Outpatient Medications on File Prior to Visit  Medication Sig Dispense Refill  . Ascorbic Acid (VITAMIN C PO) Take 500 mg by mouth in the morning and at bedtime.     . Black Currant Seed Oil 500 MG CAPS Take 500 mg by mouth daily.    . Calcium Carbonate-Vit D-Min (CALCIUM 1200 PO) Take 1,200 mg by mouth daily.     . Cholecalciferol (VITAMIN D PO) Take 2,000 mcg by mouth See admin instructions. Takes 2000 mcg 3 times a week ( monday, wednesday and friday)    . Garlic 1027 MG CAPS Take 2,000 mg by mouth daily.     . Multiple Vitamin (MULTIVITAMIN WITH MINERALS) TABS tablet Take 1 tablet by mouth daily.    . Omega-3 Fatty Acids (FISH OIL PO) Take 1 tablet by mouth daily.    Marland Kitchen OVER THE COUNTER MEDICATION Take 1 capsule by mouth daily. Mega juice    . rosuvastatin (CRESTOR) 10 MG tablet Take 1 tablet by mouth once daily (Patient taking differently: Take 10 mg by mouth daily. ) 90 tablet 1   No current facility-administered medications on file prior to visit.   Review of Systems All otherwise neg per pt     Objective:   Physical Exam BP (!) 220/110 (BP Location: Left Arm, Patient Position: Sitting, Cuff Size: Large)   Pulse (!) 110   Temp 98.7 F (37.1 C) (Oral)  Ht 5\' 2"  (1.575 m)   Wt 163 lb (73.9 kg)   SpO2 95%   BMI 29.81 kg/m  VS noted,  Constitutional: Pt appears in NAD HENT: Head: NCAT.  Right Ear: External ear normal.  Left Ear: External ear normal.  Eyes: . Pupils are equal, round, and reactive to light. Conjunctivae and EOM are normal Nose: without d/c or deformity Neck: Neck supple. Gross normal ROM Cardiovascular: Normal rate and regular rhythm.   Pulmonary/Chest: Effort normal and breath sounds without rales or wheezing.  Abd:  Soft, NT, ND, + BS, no organomegaly Neurological: Pt is alert. At baseline orientation, motor grossly intact Skin: Skin is warm. No rashes, other new lesions, no LE  edema Psychiatric: Pt behavior is normal without agitation  All otherwise neg per pt Lab Results  Component Value Date   WBC 12.7 (H) 04/11/2020   HGB 12.1 04/11/2020   HCT 37.2 04/11/2020   PLT 92 (L) 04/11/2020   GLUCOSE 94 04/11/2020   CHOL 112 06/29/2019   TRIG 172.0 (H) 06/29/2019   HDL 32.30 (L) 06/29/2019   LDLCALC 45 06/29/2019   ALT 75 (H) 04/09/2020   AST 102 (H) 04/09/2020   NA 138 04/11/2020   K 3.8 04/11/2020   CL 112 (H) 04/11/2020   CREATININE 1.32 (H) 04/11/2020   BUN 16 04/11/2020   CO2 20 (L) 04/11/2020   TSH 0.482 04/08/2020   INR 1.2 04/09/2020   HGBA1C 5.6 04/08/2020         Assessment & Plan:

## 2020-04-20 NOTE — Assessment & Plan Note (Signed)
None today, exam benign, etiology unclear, for immodium prn

## 2020-04-20 NOTE — Assessment & Plan Note (Signed)
Resolved,  to f/u any worsening symptoms or concerns  

## 2020-04-20 NOTE — Assessment & Plan Note (Signed)
stable overall by history and exam, recent data reviewed with pt, and pt to continue medical treatment as before,  to f/u any worsening symptoms or concerns  

## 2020-04-20 NOTE — Assessment & Plan Note (Signed)
Urged bp med compliance starting now asap

## 2020-04-20 NOTE — Assessment & Plan Note (Signed)
To stay on 2l home o2 for now

## 2020-04-23 ENCOUNTER — Other Ambulatory Visit: Payer: Self-pay | Admitting: *Deleted

## 2020-04-23 NOTE — Patient Outreach (Signed)
[facility]) Care Management  04/23/2020  Cassandra Dyer 1941-06-17 [id number]   RED ON EMMI ALERT - General Discharge Day # 4 Date: 5/5 Red Alert Reason: Don't know who to call with changes in condition, Lost interest in things, and Other problems/questions  Outreach attempt #2, successful, identity verified.  This care manager introduced self and stated purpose of call.  [facility] care management services explained.    Member report she is doing much better since being discharged from the hospital.  She was having some issues with nausea/vomiting a couple days after but that has since been relieved.  Denies any ongoing issues or questions.  She was seen by her PCP on 5/7, noted that her BP was elevated (220/110), admits that she hadn't taken her medications since discharge due to fear of vomiting.  Report she has since restarted taking them without issues, will have follow up visit on 5/14.  Inquired about checking BP at home, state she need new batteries but will get some as soon as possible.  Discussed having ongoing involvement with [facility], she denies the need but is open to follow up call next week.  Encouraged to contact this care manager with questions.  Valente David, [place], MSN [place] [phone]

## 2020-04-26 ENCOUNTER — Encounter: Payer: Self-pay | Admitting: Internal Medicine

## 2020-04-26 ENCOUNTER — Other Ambulatory Visit: Payer: Self-pay

## 2020-04-26 ENCOUNTER — Ambulatory Visit (INDEPENDENT_AMBULATORY_CARE_PROVIDER_SITE_OTHER): Payer: Medicare Other | Admitting: Internal Medicine

## 2020-04-26 ENCOUNTER — Ambulatory Visit (INDEPENDENT_AMBULATORY_CARE_PROVIDER_SITE_OTHER)
Admission: RE | Admit: 2020-04-26 | Discharge: 2020-04-26 | Disposition: A | Discharge status: Home / Self Care | Payer: Medicare Other | Source: Ambulatory Visit | Attending: Internal Medicine | Admitting: Internal Medicine

## 2020-04-26 VITALS — BP 180/92 | HR 84 | Temp 99.3°F | Ht 62.0 in | Wt 147.0 lb

## 2020-04-26 DIAGNOSIS — I872 Venous insufficiency (chronic) (peripheral): Secondary | ICD-10-CM | POA: Diagnosis not present

## 2020-04-26 DIAGNOSIS — I10 Essential (primary) hypertension: Secondary | ICD-10-CM

## 2020-04-26 DIAGNOSIS — R509 Fever, unspecified: Secondary | ICD-10-CM

## 2020-04-26 DIAGNOSIS — J9601 Acute respiratory failure with hypoxia: Secondary | ICD-10-CM

## 2020-04-26 DIAGNOSIS — J189 Pneumonia, unspecified organism: Secondary | ICD-10-CM | POA: Diagnosis not present

## 2020-04-26 LAB — CBC WITH DIFFERENTIAL/PLATELET
Basophils Absolute: 0 10*3/uL (ref 0.0–0.1)
Basophils Relative: 0.4 % (ref 0.0–3.0)
Eosinophils Absolute: 0.2 10*3/uL (ref 0.0–0.7)
Eosinophils Relative: 2.1 % (ref 0.0–5.0)
HCT: 40.9 % (ref 36.0–46.0)
Hemoglobin: 13.7 g/dL (ref 12.0–15.0)
Lymphocytes Relative: 10.4 % — ABNORMAL LOW (ref 12.0–46.0)
Lymphs Abs: 0.9 10*3/uL (ref 0.7–4.0)
MCHC: 33.4 g/dL (ref 30.0–36.0)
MCV: 89.7 fl (ref 78.0–100.0)
Monocytes Absolute: 1.7 10*3/uL — ABNORMAL HIGH (ref 0.1–1.0)
Monocytes Relative: 19 % — ABNORMAL HIGH (ref 3.0–12.0)
Neutro Abs: 6 10*3/uL (ref 1.4–7.7)
Neutrophils Relative %: 68.1 % (ref 43.0–77.0)
Platelets: 352 10*3/uL (ref 150.0–400.0)
RBC: 4.56 Mil/uL (ref 3.87–5.11)
RDW: 13.7 % (ref 11.5–15.5)
WBC: 8.8 10*3/uL (ref 4.0–10.5)

## 2020-04-26 LAB — BASIC METABOLIC PANEL
BUN: 15 mg/dL (ref 6–23)
CO2: 34 mEq/L — ABNORMAL HIGH (ref 19–32)
Calcium: 10.3 mg/dL (ref 8.4–10.5)
Chloride: 101 mEq/L (ref 96–112)
Creatinine, Ser: 1.59 mg/dL — ABNORMAL HIGH (ref 0.40–1.20)
GFR: 38.1 mL/min — ABNORMAL LOW (ref >60.00)
Glucose, Bld: 96 mg/dL (ref 70–99)
Potassium: 4.3 mEq/L (ref 3.5–5.1)
Sodium: 139 mEq/L (ref 135–145)

## 2020-04-26 LAB — URINALYSIS, ROUTINE W REFLEX MICROSCOPIC
Bilirubin Urine: NEGATIVE
Hgb urine dipstick: NEGATIVE
Leukocytes,Ua: NEGATIVE
Nitrite: NEGATIVE
Specific Gravity, Urine: 1.015 (ref 1.000–1.030)
Urine Glucose: NEGATIVE
Urobilinogen, UA: 0.2 (ref 0.0–1.0)
pH: 7.5 (ref 5.0–8.0)

## 2020-04-26 LAB — HEPATIC FUNCTION PANEL
ALT: 46 U/L — ABNORMAL HIGH (ref 0–35)
AST: 41 U/L — ABNORMAL HIGH (ref 0–37)
Albumin: 3.3 g/dL — ABNORMAL LOW (ref 3.5–5.2)
Alkaline Phosphatase: 63 U/L (ref 39–117)
Bilirubin, Direct: 0.1 mg/dL (ref 0.0–0.3)
Total Bilirubin: 0.3 mg/dL (ref 0.2–1.2)
Total Protein: 6.1 g/dL (ref 6.0–8.3)

## 2020-04-26 MED ORDER — TELMISARTAN 80 MG PO TABS: 80.0000 mg | ORAL_TABLET | Freq: Every day | ORAL | 3 refills | Status: DC

## 2020-04-26 NOTE — Patient Instructions (Signed)
Please take all new medication as prescribed - the generic micardis 80 mg per day  You are given the order to stop the oxygen to give to your oxygen provider  Please continue all other medications as before, and refills have been done if requested.  Please have the pharmacy call with any other refills you may need.  Please continue your efforts at being more active, low cholesterol diet, and weight control.  You are otherwise up to date with prevention measures today.  Please keep your appointments with your specialists as you may have planned  Please go to the XRAY Department in the first floor for the x-ray testing  Please go to the LAB at the blood drawing area for the tests to be done  You will be contacted by phone if any changes need to be made immediately.  Otherwise, you will receive a letter about your results with an explanation, but please check with MyChart first.  Please remember to sign up for MyChart if you have not done so, as this will be important to you in the future with finding out test results, communicating by private email, and scheduling acute appointments online when needed.  Please make an Appointment to return in 3 weeks, or sooner if needed a

## 2020-04-26 NOTE — Assessment & Plan Note (Signed)
Resistant; for add micardis 80 after baseline renal fxn today and f/u 3 wks, cont all other tx - BB, HCT and amlodipine 10

## 2020-04-26 NOTE — Assessment & Plan Note (Signed)
Now at baseline with trivial to the RLE, and trace to LLE to mid leg only, cont same tx

## 2020-04-26 NOTE — Progress Notes (Signed)
Subjective:    Patient ID: Cassandra Dyer, female    DOB: 04-13-1941, 79 y.o.   MRN: [id number]  HPI  Here 1 wk f/u overall feeling much improved, better energy and stamina, no dizziness.  sbp at home last night 160. Pt denies chest pain, increased sob or doe, wheezing, orthopnea, PND, increased LE swelling, palpitations, or syncope.  Swelling improved as well.  Diarrhea resolved.  Needs f/u oxygen need.  Has low grade temp but denies chills, hA, ST, cough, and Denies urinary symptoms such as dysuria, frequency, urgency, flank pain, hematuria or n/v, .   Pt denies fever, wt loss, night sweats, loss of appetite, or other constitutional symptoms Past Medical History:  Diagnosis Date  . Anemia   . Hepatitis    hx of hep C - 10-20 years ago   . History of blood transfusion   . History of kidney cancer   . Hyperlipidemia   . Hypertension    not taken b/p med in 2-3 years md deceased never went to another md    Past Surgical History:  Procedure Laterality Date  . CATARACT EXTRACTION Bilateral    11/30 left /12/9 right  . ROBOT ASSISTED LAPAROSCOPIC NEPHRECTOMY Right 01/13/2017   Procedure: XI ROBOTIC ASSISTED LAPAROSCOPIC RADICAL NEPHRECTOMY WITH LYSIS OF ADHESION;  Surgeon: Alexis Frock, MD;  Location: [facility];  Service: Urology;  Laterality: Right;    reports that she quit smoking about 19 years ago. Her smoking use included cigarettes. She has a 15.00 pack-year smoking history. She has never used smokeless tobacco. She reports current alcohol use. She reports that she does not use drugs. family history includes Breast cancer in her sister; Hypertension in her mother; Pancreatic cancer in her father. No Known Allergies Current Outpatient Medications on File Prior to Visit  Medication Sig Dispense Refill  . amLODipine (NORVASC) 10 MG tablet Take 1 tablet (10 mg total) by mouth daily. 90 tablet 3  . Ascorbic Acid (VITAMIN C PO) Take 500 mg by mouth in the morning and at bedtime.     .  Black Currant Seed Oil 500 MG CAPS Take 500 mg by mouth daily.    . Calcium Carbonate-Vit D-Min (CALCIUM 1200 PO) Take 1,200 mg by mouth daily.     . Cholecalciferol (VITAMIN D PO) Take 2,000 mcg by mouth See admin instructions. Takes 2000 mcg 3 times a week ( monday, wednesday and friday)    . Garlic 9024 MG CAPS Take 2,000 mg by mouth daily.     . hydrochlorothiazide (HYDRODIURIL) 25 MG tablet Take 1 tablet (25 mg total) by mouth daily. 90 tablet 3  . metoprolol succinate (TOPROL-XL) 100 MG 24 hr tablet Take 1 tablet (100 mg total) by mouth daily. Take with or immediately following a meal. 90 tablet 3  . Multiple Vitamin (MULTIVITAMIN WITH MINERALS) TABS tablet Take 1 tablet by mouth daily.    . Omega-3 Fatty Acids (FISH OIL PO) Take 1 tablet by mouth daily.    . ondansetron (ZOFRAN) 4 MG tablet Take 1 tablet (4 mg total) by mouth every 8 (eight) hours as needed for nausea or vomiting. 30 tablet 1  . OVER THE COUNTER MEDICATION Take 1 capsule by mouth daily. Mega juice    . rosuvastatin (CRESTOR) 10 MG tablet Take 1 tablet by mouth once daily (Patient taking differently: Take 10 mg by mouth daily. ) 90 tablet 1   No current facility-administered medications on file prior to visit.   Review of  Systems All otherwise neg per pt     Objective:   Physical Exam BP (!) 180/92 (BP Location: Left Arm, Patient Position: Sitting, Cuff Size: Large)   Pulse 84   Temp 99.3 F (37.4 C) (Oral)   Ht 5\' 2"  (1.575 m)   Wt 147 lb (66.7 kg)   SpO2 99%   BMI 26.89 kg/m  o2 sat after ambulation on RA - 98% VS noted,  Constitutional: Pt appears in NAD HENT: Head: NCAT.  Right Ear: External ear normal.  Left Ear: External ear normal.  Eyes: . Pupils are equal, round, and reactive to light. Conjunctivae and EOM are normal Nose: without d/c or deformity Neck: Neck supple. Gross normal ROM Cardiovascular: Normal rate and regular rhythm.   Pulmonary/Chest: Effort normal and breath sounds without rales or  wheezing.  Abd:  Soft, NT, ND, + BS, no organomegaly Neurological: Pt is alert. At baseline orientation, motor grossly intact Skin: Skin is warm. No rashes, other new lesions, trace LLE edema only Psychiatric: Pt behavior is normal without agitation  All otherwise neg per pt Lab Results  Component Value Date   WBC 8.8 04/26/2020   HGB 13.7 04/26/2020   HCT 40.9 04/26/2020   PLT 352.0 04/26/2020   GLUCOSE 96 04/26/2020   CHOL 112 06/29/2019   TRIG 172.0 (H) 06/29/2019   HDL 32.30 (L) 06/29/2019   LDLCALC 45 06/29/2019   ALT 46 (H) 04/26/2020   AST 41 (H) 04/26/2020   NA 139 04/26/2020   K 4.3 04/26/2020   CL 101 04/26/2020   CREATININE 1.59 (H) 04/26/2020   BUN 15 04/26/2020   CO2 34 (H) 04/26/2020   TSH 0.482 04/08/2020   INR 1.2 04/09/2020   HGBA1C 5.6 04/08/2020      Assessment & Plan:

## 2020-04-27 ENCOUNTER — Encounter: Payer: Self-pay | Admitting: Internal Medicine

## 2020-04-27 LAB — URINE CULTURE: Result:: NO GROWTH

## 2020-04-27 NOTE — Assessment & Plan Note (Signed)
For ua,  Cxr, labs as ordered

## 2020-04-27 NOTE — Assessment & Plan Note (Addendum)
Resolved, ok to d/c oxygen  I spent 41 minutes in preparing to see the patient by review of recent labs, imaging and procedures, obtaining and reviewing separately obtained history, communicating with the patient and family or caregiver, ordering medications, tests or procedures, and documenting clinical information in the EHR including the differential Dx, treatment, and any further evaluation and other management of acute resp failure, htn, edema, diarrhea, fever

## 2020-05-01 ENCOUNTER — Telehealth: Payer: Self-pay

## 2020-05-01 NOTE — Telephone Encounter (Signed)
New message   Seen on  5.14.21   The patient voiced she would like to continue to be on her oxygen machine throughout the day due to a drop in saturation.   Asking for a callback  Please advise.

## 2020-05-02 ENCOUNTER — Other Ambulatory Visit: Payer: Self-pay | Admitting: *Deleted

## 2020-05-02 NOTE — Telephone Encounter (Signed)
No sorry, your last evaluation showed this was not needed  We could repeat at another visit if she likes, and should have an OV if having any worsening breathing again

## 2020-05-02 NOTE — Telephone Encounter (Signed)
Notified pt w/MD response.../lmb 

## 2020-05-02 NOTE — Patient Outreach (Signed)
[facility]) Care Management  05/02/2020  Cassandra Dyer [birth year] [id number]   Call placed to member to follow up on management of hypertension.  BP during last office visit was still elevated at 180/92, was placed on Micardis.  State her blood pressure is now much better, todays reading was 140/66.  She will have another follow up on 6/10 with PCP.  Report she will continue to monitor pressure daily and record trends to share with MD.  Attempted to engage member for ongoing follow up, denies the need stating she is "doing super great."  Benefits of program explained, she again declined to continue with engagement.  State she will contact this care manager should her needs change.  Will close case at this time.  Valente David, [place], MSN [place] [phone]

## 2020-05-23 ENCOUNTER — Other Ambulatory Visit: Payer: Self-pay

## 2020-05-23 ENCOUNTER — Ambulatory Visit (INDEPENDENT_AMBULATORY_CARE_PROVIDER_SITE_OTHER): Payer: Medicare Other | Admitting: Internal Medicine

## 2020-05-23 ENCOUNTER — Encounter: Payer: Self-pay | Admitting: Internal Medicine

## 2020-05-23 VITALS — BP 160/70 | HR 78 | Temp 98.5°F | Ht 62.0 in | Wt 142.0 lb

## 2020-05-23 DIAGNOSIS — R739 Hyperglycemia, unspecified: Secondary | ICD-10-CM

## 2020-05-23 DIAGNOSIS — E785 Hyperlipidemia, unspecified: Secondary | ICD-10-CM | POA: Diagnosis not present

## 2020-05-23 DIAGNOSIS — N1832 Chronic kidney disease, stage 3b: Secondary | ICD-10-CM

## 2020-05-23 DIAGNOSIS — I10 Essential (primary) hypertension: Secondary | ICD-10-CM

## 2020-05-23 DIAGNOSIS — Z905 Acquired absence of kidney: Secondary | ICD-10-CM | POA: Diagnosis not present

## 2020-05-23 DIAGNOSIS — J9601 Acute respiratory failure with hypoxia: Secondary | ICD-10-CM

## 2020-05-23 DIAGNOSIS — N189 Chronic kidney disease, unspecified: Secondary | ICD-10-CM | POA: Insufficient documentation

## 2020-05-23 NOTE — Patient Instructions (Signed)
Ok to stop the oxygen as before, and we will need to send the STOP order to your oxygen provider  You will be contacted regarding the referral for: Nephrology ([facility])  Please continue all other medications as before, and refills have been done if requested.  Please have the pharmacy call with any other refills you may need.  Please continue your efforts at being more active, low cholesterol diet, and weight control  Please keep your appointments with your specialists as you may have planned  Please make an Appointment to return in 6 months, or sooner if needed

## 2020-05-23 NOTE — Progress Notes (Signed)
Subjective:    Patient ID: Cassandra Dyer, female    DOB: Nov 06, 1941, 79 y.o.   MRN: [id number]  HPI  Here to f/u; overall doing ok,  Pt denies chest pain, increasing sob or doe, wheezing, orthopnea, PND, increased LE swelling, palpitations, dizziness or syncope.  Pt denies new neurological symptoms such as new headache, or facial or extremity weakness or numbness.  Pt denies polydipsia, polyuria, or low sugar episode.  Pt states overall good compliance with meds, mostly trying to follow appropriate diet, with wt overall stable,  but little exercise however. Oxygen for some reason not yet picked up at home.  Note pt has solitary kidney.   No new complaints Past Medical History:  Diagnosis Date  . Anemia   . Hepatitis    hx of hep C - 10-20 years ago   . History of blood transfusion   . History of kidney cancer   . Hyperlipidemia   . Hypertension    not taken b/p med in 2-3 years md deceased never went to another md    Past Surgical History:  Procedure Laterality Date  . CATARACT EXTRACTION Bilateral    11/30 left /12/9 right  . ROBOT ASSISTED LAPAROSCOPIC NEPHRECTOMY Right 01/13/2017   Procedure: XI ROBOTIC ASSISTED LAPAROSCOPIC RADICAL NEPHRECTOMY WITH LYSIS OF ADHESION;  Surgeon: Alexis Frock, MD;  Location: [facility];  Service: Urology;  Laterality: Right;    reports that she quit smoking about 19 years ago. Her smoking use included cigarettes. She has a 15.00 pack-year smoking history. She has never used smokeless tobacco. She reports current alcohol use. She reports that she does not use drugs. family history includes Breast cancer in her sister; Hypertension in her mother; Pancreatic cancer in her father. No Known Allergies Current Outpatient Medications on File Prior to Visit  Medication Sig Dispense Refill  . amLODipine (NORVASC) 10 MG tablet Take 1 tablet (10 mg total) by mouth daily. 90 tablet 3  . Ascorbic Acid (VITAMIN C PO) Take 500 mg by mouth in the morning and at  bedtime.     . Black Currant Seed Oil 500 MG CAPS Take 500 mg by mouth daily.    . Calcium Carbonate-Vit D-Min (CALCIUM 1200 PO) Take 1,200 mg by mouth daily.     . Cholecalciferol (VITAMIN D PO) Take 2,000 mcg by mouth See admin instructions. Takes 2000 mcg 3 times a week ( monday, wednesday and friday)    . Garlic 7616 MG CAPS Take 2,000 mg by mouth daily.     . hydrochlorothiazide (HYDRODIURIL) 25 MG tablet Take 1 tablet (25 mg total) by mouth daily. 90 tablet 3  . metoprolol succinate (TOPROL-XL) 100 MG 24 hr tablet Take 1 tablet (100 mg total) by mouth daily. Take with or immediately following a meal. 90 tablet 3  . Multiple Vitamin (MULTIVITAMIN WITH MINERALS) TABS tablet Take 1 tablet by mouth daily.    . Omega-3 Fatty Acids (FISH OIL PO) Take 1 tablet by mouth daily.    . ondansetron (ZOFRAN) 4 MG tablet Take 1 tablet (4 mg total) by mouth every 8 (eight) hours as needed for nausea or vomiting. 30 tablet 1  . OVER THE COUNTER MEDICATION Take 1 capsule by mouth daily. Mega juice    . rosuvastatin (CRESTOR) 10 MG tablet Take 1 tablet by mouth once daily (Patient taking differently: Take 10 mg by mouth daily. ) 90 tablet 1  . telmisartan (MICARDIS) 80 MG tablet Take 1 tablet (80 mg total)  by mouth daily. 90 tablet 3   No current facility-administered medications on file prior to visit.   Review of Systems All otherwise neg per pt    Objective:   Physical Exam BP (!) 160/70 (BP Location: Left Arm, Patient Position: Sitting, Cuff Size: Large)   Pulse 78   Temp 98.5 F (36.9 C) (Oral)   Ht 5\' 2"  (1.575 m)   Wt 142 lb (64.4 kg)   SpO2 96%   BMI 25.97 kg/m  VS noted,  Constitutional: Pt appears in NAD HENT: Head: NCAT.  Right Ear: External ear normal.  Left Ear: External ear normal.  Eyes: . Pupils are equal, round, and reactive to light. Conjunctivae and EOM are normal Nose: without d/c or deformity Neck: Neck supple. Gross normal ROM Cardiovascular: Normal rate and regular  rhythm.   Pulmonary/Chest: Effort normal and breath sounds without rales or wheezing.  Abd:  Soft, NT, ND, + BS, no organomegaly Neurological: Pt is alert. At baseline orientation, motor grossly intact Skin: Skin is warm. No rashes, other new lesions, no LE edema Psychiatric: Pt behavior is normal without agitation  All otherwise neg per pt Lab Results  Component Value Date   WBC 8.8 04/26/2020   HGB 13.7 04/26/2020   HCT 40.9 04/26/2020   PLT 352.0 04/26/2020   GLUCOSE 96 04/26/2020   CHOL 112 06/29/2019   TRIG 172.0 (H) 06/29/2019   HDL 32.30 (L) 06/29/2019   LDLCALC 45 06/29/2019   ALT 46 (H) 04/26/2020   AST 41 (H) 04/26/2020   NA 139 04/26/2020   K 4.3 04/26/2020   CL 101 04/26/2020   CREATININE 1.59 (H) 04/26/2020   BUN 15 04/26/2020   CO2 34 (H) 04/26/2020   TSH 0.482 04/08/2020   INR 1.2 04/09/2020   HGBA1C 5.6 04/08/2020         Assessment & Plan:

## 2020-05-26 ENCOUNTER — Encounter: Payer: Self-pay | Admitting: Internal Medicine

## 2020-05-26 NOTE — Assessment & Plan Note (Signed)
stable overall by history and exam, recent data reviewed with pt, and pt to continue medical treatment as before,  to f/u any worsening symptoms or concerns  

## 2020-05-26 NOTE — Assessment & Plan Note (Signed)
With solitary kidney, for renal referral

## 2020-05-26 NOTE — Assessment & Plan Note (Signed)
For renal f/u

## 2020-05-26 NOTE — Assessment & Plan Note (Addendum)
Resolved, to d/c home o2  I spent 31 minutes in preparing to see the patient by review of recent labs, imaging and procedures, obtaining and reviewing separately obtained history, communicating with the patient and family or caregiver, ordering medications, tests or procedures, and documenting clinical information in the EHR including the differential Dx, treatment, and any further evaluation and other management of acute hypox resp failure, ckd, solitary kidney, htn, hld, hyperglycemia

## 2020-06-10 ENCOUNTER — Telehealth: Payer: Self-pay

## 2020-06-10 DIAGNOSIS — J9601 Acute respiratory failure with hypoxia: Secondary | ICD-10-CM

## 2020-06-10 NOTE — Telephone Encounter (Signed)
PCP has written rx for [place] oxygen.   Will enter and EMR and Fax.

## 2020-06-14 NOTE — Telephone Encounter (Signed)
Order faxed x 3 - waiting on confirmation to come back through.

## 2020-06-26 ENCOUNTER — Ambulatory Visit: Payer: Medicare Other | Admitting: Internal Medicine

## 2020-07-22 ENCOUNTER — Telehealth (INDEPENDENT_AMBULATORY_CARE_PROVIDER_SITE_OTHER): Payer: Medicare Other | Admitting: Family

## 2020-07-22 ENCOUNTER — Other Ambulatory Visit: Payer: Self-pay | Admitting: Internal Medicine

## 2020-07-22 ENCOUNTER — Other Ambulatory Visit: Payer: Self-pay

## 2020-07-22 DIAGNOSIS — E785 Hyperlipidemia, unspecified: Secondary | ICD-10-CM

## 2020-07-22 DIAGNOSIS — J209 Acute bronchitis, unspecified: Secondary | ICD-10-CM | POA: Diagnosis not present

## 2020-07-22 MED ORDER — BENZONATATE 200 MG PO CAPS
200.0000 mg | ORAL_CAPSULE | Freq: Three times a day (TID) | ORAL | 0 refills | Status: DC | PRN
Start: 2020-07-22 — End: 2020-11-06

## 2020-07-22 MED ORDER — DOXYCYCLINE HYCLATE 100 MG PO TABS
100.0000 mg | ORAL_TABLET | Freq: Two times a day (BID) | ORAL | 0 refills | Status: DC
Start: 2020-07-22 — End: 2020-08-08

## 2020-07-22 NOTE — Progress Notes (Signed)
Cassandra Dyer is a 79 y.o. female with the following history as recorded in EpicCare:  Patient Active Problem List   Diagnosis Date Noted   CKD (chronic kidney disease) 05/23/2020   Solitary kidney, acquired 05/23/2020   Fever 04/26/2020   Acute hypoxemic respiratory failure (HCC) 04/19/2020   Diarrhea 04/19/2020   Nausea & vomiting 04/19/2020   Pneumonia 04/09/2020   Acute renal failure ([place]) 04/09/2020   Hypertensive urgency 04/09/2020   History of hepatitis C 04/09/2020   Sepsis ([place]) 04/08/2020   Venous insufficiency 12/28/2019   Hyperglycemia 06/29/2019   Osteoporosis 02/20/2018   Essential hypertension 02/15/2017   Dyslipidemia 02/15/2017   Renal cell carcinoma of right kidney ([place]) 02/15/2017    Current Outpatient Medications  Medication Sig Dispense Refill   amLODipine (NORVASC) 10 MG tablet Take 1 tablet (10 mg total) by mouth daily. 90 tablet 3   Ascorbic Acid (VITAMIN C PO) Take 500 mg by mouth in the morning and at bedtime.      benzonatate (TESSALON) 200 MG capsule Take 1 capsule (200 mg total) by mouth 3 (three) times daily as needed. 30 capsule 0   Black Currant Seed Oil 500 MG CAPS Take 500 mg by mouth daily.     Calcium Carbonate-Vit D-Min (CALCIUM 1200 PO) Take 1,200 mg by mouth daily.      Cholecalciferol (VITAMIN D PO) Take 2,000 mcg by mouth See admin instructions. Takes 2000 mcg 3 times a week ( monday, wednesday and friday)     doxycycline (VIBRA-TABS) 100 MG tablet Take 1 tablet (100 mg total) by mouth 2 (two) times daily. 20 tablet 0   Garlic 3662 MG CAPS Take 2,000 mg by mouth daily.      hydrochlorothiazide (HYDRODIURIL) 25 MG tablet Take 1 tablet (25 mg total) by mouth daily. 90 tablet 3   metoprolol succinate (TOPROL-XL) 100 MG 24 hr tablet Take 1 tablet (100 mg total) by mouth daily. Take with or immediately following a meal. 90 tablet 3   Multiple Vitamin (MULTIVITAMIN WITH MINERALS) TABS tablet Take 1 tablet by mouth  daily.     Omega-3 Fatty Acids (FISH OIL PO) Take 1 tablet by mouth daily.     ondansetron (ZOFRAN) 4 MG tablet Take 1 tablet (4 mg total) by mouth every 8 (eight) hours as needed for nausea or vomiting. 30 tablet 1   OVER THE COUNTER MEDICATION Take 1 capsule by mouth daily. Mega juice     rosuvastatin (CRESTOR) 10 MG tablet Take 1 tablet by mouth once daily (Patient taking differently: Take 10 mg by mouth daily. ) 90 tablet 1   telmisartan (MICARDIS) 80 MG tablet Take 1 tablet (80 mg total) by mouth daily. 90 tablet 3   No current facility-administered medications for this visit.    Allergies: Patient has no known allergies.  Past Medical History:  Diagnosis Date   Anemia    Hepatitis    hx of hep C - 10-20 years ago    History of blood transfusion    History of kidney cancer    Hyperlipidemia    Hypertension    not taken b/p med in 2-3 years md deceased never went to another md     Past Surgical History:  Procedure Laterality Date   CATARACT EXTRACTION Bilateral    11/30 left /12/9 right   ROBOT ASSISTED LAPAROSCOPIC NEPHRECTOMY Right 01/13/2017   Procedure: XI ROBOTIC ASSISTED LAPAROSCOPIC RADICAL NEPHRECTOMY WITH LYSIS OF ADHESION;  Surgeon: Alexis Frock, MD;  Location:  [facility];  Service: Urology;  Laterality: Right;    Family History  Problem Relation Age of Onset   Hypertension Mother    Pancreatic cancer Father    Breast cancer Sister     Social History   Tobacco Use   Smoking status: Former Smoker    Packs/day: 0.50    Years: 30.00    Pack years: 15.00    Types: Cigarettes    Quit date: 12/14/2000    Years since quitting: 19.6   Smokeless tobacco: Never Used  Substance Use Topics   Alcohol use: Yes    Comment: occasional wine     Subjective:    I connected with Cassandra Dyer on 07/22/20 at  9:40 AM EDT by a telephone call and verified that I am speaking with the correct person using two identifiers.   I discussed the limitations of  evaluation and management by telemedicine and the availability of in person appointments. The patient expressed understanding and agreed to proceed. Provider in office/ patient is at home; provider and patient are only 2 people on telephone call.   Patient is concerned about 4 day history of cough/ congestion; notes that symptoms started on Friday; she denies any fever or chest pain or shortness of breath; does not have underlying asthma or COPD documented but was hospitalized with pneumonia in May 2021; is not using any OTC medication; did take COVID vaccine- has no concerns for exposure; Requesting Rx for antibiotic;   Objective:  There were no vitals filed for this visit.  General: Well developed, well nourished, in no acute distress  Lungs: Respirations unlabored; Neurologic: Alert and oriented; speech intact;  Assessment:  1. Acute bronchitis, unspecified organism     Plan:  Rx for Doxycycline 100 mg bid x 10 days and Tessalon Perles 200 mg tid prn; increase fluids, rest; follow-up worse, no better- will need to consider in person evaluation/ CXR;  Time spent 8 minutes    No follow-ups on file.  No orders of the defined types were placed in this encounter.   Requested Prescriptions   Signed Prescriptions Disp Refills   benzonatate (TESSALON) 200 MG capsule 30 capsule 0    Sig: Take 1 capsule (200 mg total) by mouth 3 (three) times daily as needed.   doxycycline (VIBRA-TABS) 100 MG tablet 20 tablet 0    Sig: Take 1 tablet (100 mg total) by mouth 2 (two) times daily.

## 2020-07-26 ENCOUNTER — Inpatient Hospital Stay (HOSPITAL_COMMUNITY)
Admission: EM | Admit: 2020-07-26 | Discharge: 2020-08-08 | DRG: 674 | Disposition: A | Discharge status: Home / Self Care | Payer: Medicare Other | Attending: Internal Medicine | Admitting: Internal Medicine

## 2020-07-26 ENCOUNTER — Encounter (HOSPITAL_COMMUNITY): Payer: Self-pay | Admitting: Emergency Medicine

## 2020-07-26 ENCOUNTER — Emergency Department (HOSPITAL_COMMUNITY): Payer: Medicare Other

## 2020-07-26 ENCOUNTER — Other Ambulatory Visit: Payer: Self-pay

## 2020-07-26 DIAGNOSIS — N170 Acute kidney failure with tubular necrosis: Principal | ICD-10-CM | POA: Diagnosis present

## 2020-07-26 DIAGNOSIS — Z8249 Family history of ischemic heart disease and other diseases of the circulatory system: Secondary | ICD-10-CM

## 2020-07-26 DIAGNOSIS — J189 Pneumonia, unspecified organism: Secondary | ICD-10-CM | POA: Diagnosis not present

## 2020-07-26 DIAGNOSIS — R21 Rash and other nonspecific skin eruption: Secondary | ICD-10-CM | POA: Diagnosis not present

## 2020-07-26 DIAGNOSIS — E782 Mixed hyperlipidemia: Secondary | ICD-10-CM | POA: Diagnosis present

## 2020-07-26 DIAGNOSIS — R768 Other specified abnormal immunological findings in serum: Secondary | ICD-10-CM | POA: Diagnosis present

## 2020-07-26 DIAGNOSIS — R0602 Shortness of breath: Secondary | ICD-10-CM

## 2020-07-26 DIAGNOSIS — Z9842 Cataract extraction status, left eye: Secondary | ICD-10-CM

## 2020-07-26 DIAGNOSIS — Z79899 Other long term (current) drug therapy: Secondary | ICD-10-CM

## 2020-07-26 DIAGNOSIS — J90 Pleural effusion, not elsewhere classified: Secondary | ICD-10-CM | POA: Diagnosis not present

## 2020-07-26 DIAGNOSIS — D509 Iron deficiency anemia, unspecified: Secondary | ICD-10-CM | POA: Diagnosis present

## 2020-07-26 DIAGNOSIS — Z9841 Cataract extraction status, right eye: Secondary | ICD-10-CM

## 2020-07-26 DIAGNOSIS — Z20822 Contact with and (suspected) exposure to covid-19: Secondary | ICD-10-CM | POA: Diagnosis not present

## 2020-07-26 DIAGNOSIS — Z66 Do not resuscitate: Secondary | ICD-10-CM | POA: Diagnosis present

## 2020-07-26 DIAGNOSIS — E875 Hyperkalemia: Secondary | ICD-10-CM | POA: Diagnosis not present

## 2020-07-26 DIAGNOSIS — E872 Acidosis, unspecified: Secondary | ICD-10-CM | POA: Diagnosis present

## 2020-07-26 DIAGNOSIS — I120 Hypertensive chronic kidney disease with stage 5 chronic kidney disease or end stage renal disease: Secondary | ICD-10-CM | POA: Diagnosis present

## 2020-07-26 DIAGNOSIS — Z905 Acquired absence of kidney: Secondary | ICD-10-CM

## 2020-07-26 DIAGNOSIS — N179 Acute kidney failure, unspecified: Secondary | ICD-10-CM

## 2020-07-26 DIAGNOSIS — Z85528 Personal history of other malignant neoplasm of kidney: Secondary | ICD-10-CM

## 2020-07-26 DIAGNOSIS — Z803 Family history of malignant neoplasm of breast: Secondary | ICD-10-CM

## 2020-07-26 DIAGNOSIS — Z8619 Personal history of other infectious and parasitic diseases: Secondary | ICD-10-CM

## 2020-07-26 DIAGNOSIS — D649 Anemia, unspecified: Secondary | ICD-10-CM | POA: Diagnosis present

## 2020-07-26 DIAGNOSIS — J449 Chronic obstructive pulmonary disease, unspecified: Secondary | ICD-10-CM | POA: Diagnosis present

## 2020-07-26 DIAGNOSIS — Z800 Family history of malignant neoplasm of digestive organs: Secondary | ICD-10-CM

## 2020-07-26 DIAGNOSIS — E8770 Fluid overload, unspecified: Secondary | ICD-10-CM | POA: Diagnosis present

## 2020-07-26 DIAGNOSIS — J811 Chronic pulmonary edema: Secondary | ICD-10-CM

## 2020-07-26 DIAGNOSIS — N1832 Chronic kidney disease, stage 3b: Secondary | ICD-10-CM

## 2020-07-26 DIAGNOSIS — D631 Anemia in chronic kidney disease: Secondary | ICD-10-CM | POA: Diagnosis present

## 2020-07-26 DIAGNOSIS — R197 Diarrhea, unspecified: Secondary | ICD-10-CM | POA: Diagnosis present

## 2020-07-26 DIAGNOSIS — E860 Dehydration: Secondary | ICD-10-CM | POA: Diagnosis present

## 2020-07-26 DIAGNOSIS — D696 Thrombocytopenia, unspecified: Secondary | ICD-10-CM

## 2020-07-26 DIAGNOSIS — N186 End stage renal disease: Secondary | ICD-10-CM | POA: Diagnosis present

## 2020-07-26 DIAGNOSIS — D72829 Elevated white blood cell count, unspecified: Secondary | ICD-10-CM | POA: Diagnosis not present

## 2020-07-26 DIAGNOSIS — E785 Hyperlipidemia, unspecified: Secondary | ICD-10-CM | POA: Diagnosis present

## 2020-07-26 DIAGNOSIS — I10 Essential (primary) hypertension: Secondary | ICD-10-CM | POA: Diagnosis present

## 2020-07-26 DIAGNOSIS — Z87891 Personal history of nicotine dependence: Secondary | ICD-10-CM

## 2020-07-26 DIAGNOSIS — R059 Cough, unspecified: Secondary | ICD-10-CM

## 2020-07-26 LAB — CBC WITH DIFFERENTIAL/PLATELET
Abs Immature Granulocytes: 0.1 10*3/uL — ABNORMAL HIGH (ref 0.00–0.07)
Basophils Absolute: 0 10*3/uL (ref 0.0–0.1)
Basophils Relative: 0 %
Eosinophils Absolute: 0 10*3/uL (ref 0.0–0.5)
Eosinophils Relative: 0 %
HCT: 43.5 % (ref 36.0–46.0)
Hemoglobin: 13.4 g/dL (ref 12.0–15.0)
Immature Granulocytes: 1 %
Lymphocytes Relative: 5 %
Lymphs Abs: 0.6 10*3/uL — ABNORMAL LOW (ref 0.7–4.0)
MCH: 28.9 pg (ref 26.0–34.0)
MCHC: 30.8 g/dL (ref 30.0–36.0)
MCV: 93.8 fL (ref 80.0–100.0)
Monocytes Absolute: 0.7 10*3/uL (ref 0.1–1.0)
Monocytes Relative: 6 %
Neutro Abs: 11.4 10*3/uL — ABNORMAL HIGH (ref 1.7–7.7)
Neutrophils Relative %: 88 %
Platelets: 242 10*3/uL (ref 150–400)
RBC: 4.64 MIL/uL (ref 3.87–5.11)
RDW: 17.2 % — ABNORMAL HIGH (ref 11.5–15.5)
WBC: 12.8 10*3/uL — ABNORMAL HIGH (ref 4.0–10.5)
nRBC: 0.2 % (ref 0.0–0.2)

## 2020-07-26 LAB — COMPREHENSIVE METABOLIC PANEL
ALT: 32 U/L (ref 0–44)
AST: 33 U/L (ref 15–41)
Albumin: 3.2 g/dL — ABNORMAL LOW (ref 3.5–5.0)
Alkaline Phosphatase: 46 U/L (ref 38–126)
Anion gap: 16 — ABNORMAL HIGH (ref 5–15)
BUN: 115 mg/dL — ABNORMAL HIGH (ref 8–23)
CO2: 13 mmol/L — ABNORMAL LOW (ref 22–32)
Calcium: 9.2 mg/dL (ref 8.9–10.3)
Chloride: 110 mmol/L (ref 98–111)
Creatinine, Ser: 12.06 mg/dL — ABNORMAL HIGH (ref 0.44–1.00)
GFR calc Af Amer: 3 mL/min — ABNORMAL LOW (ref >60)
GFR calc non Af Amer: 3 mL/min — ABNORMAL LOW (ref >60)
Glucose, Bld: 113 mg/dL — ABNORMAL HIGH (ref 70–99)
Potassium: 6.6 mmol/L (ref 3.5–5.1)
Sodium: 139 mmol/L (ref 135–145)
Total Bilirubin: 1.1 mg/dL (ref 0.3–1.2)
Total Protein: 6.4 g/dL — ABNORMAL LOW (ref 6.5–8.1)

## 2020-07-26 LAB — SARS CORONAVIRUS 2 BY RT PCR (HOSPITAL ORDER, PERFORMED IN ~~LOC~~ HOSPITAL LAB): SARS Coronavirus 2: NEGATIVE

## 2020-07-26 MED ORDER — SODIUM CHLORIDE 0.9 % IV BOLUS
1000.0000 mL | Freq: Once | INTRAVENOUS | Status: AC
  Administered 2020-07-27: 1000 mL via INTRAVENOUS

## 2020-07-26 MED ORDER — ONDANSETRON HCL 4 MG/2ML IJ SOLN
4.0000 mg | Freq: Once | INTRAMUSCULAR | Status: AC
  Administered 2020-07-27: 4 mg via INTRAVENOUS
  Filled 2020-07-26: qty 2

## 2020-07-26 MED ORDER — SODIUM ZIRCONIUM CYCLOSILICATE 10 G PO PACK
10.0000 g | PACK | Freq: Once | ORAL | Status: AC
  Administered 2020-07-27: 10 g via ORAL
  Filled 2020-07-26: qty 1

## 2020-07-26 NOTE — ED Triage Notes (Signed)
Pt to ED with c/o nausea and vomiting for several days.  Pt st's she is currently taking Doxycycline for cough.  Pt also c/o shortness of breath

## 2020-07-26 NOTE — ED Provider Notes (Signed)
[facility] EMERGENCY DEPARTMENT Provider Note   CSN: [id number] Arrival date & time: 07/26/20  1715     History Chief Complaint  Patient presents with  . Vomiting    Cassandra Dyer is a 79 y.o. female with history of renal cell carcinoma s/p right nephrectomy in 2018, HTN, HLD, remote hepatitis C, and COPD who presents to the emergency department with a chief complaint of vomiting.  The patient reports nonbloody, nonbilious vomiting, onset 24 hours ago.  She has had nausea for several days that has been well controlled with Zofran at home.  She was unable to take her home medications due to vomiting.  She reports associated watery diarrhea and swelling noted to her arms and legs.  States that she previously had swelling to her face, hands, and legs several months ago and was started on 25 mg of Lasix daily.  She also reports progressively worsening dyspnea with exertion over the last week accompanied by cough and congestion.  She had a telemedicine visit with her PCP on 8/9 and was started on a course of doxycycline and Tessalon Perles. She also notes polydipsia and dysuria have been ongoing for an unknown amount of time.  She denies fever, chills, chest pain, abdominal pain, orthopnea, PND, dizziness, lightheadedness, confusion.  Patient reports that she saw her PCP around June and was referred to nephrology for worsening kidney function.  Her appointment was scheduled for 8/12, but she was feeling so poorly that she rescheduled the appointment for 8/26.   Last admission in April 2021 with sepsis secondary to acuity acquired pneumonia complicated by previously undiagnosed COPD.  Patient was hypoxic on arrival and discharged with home oxygen, which has since been discontinued.  PCP: Dr. Cathlean Cower  The history is provided by the patient. No language interpreter was used.       Past Medical History:  Diagnosis Date  . Anemia   . Hepatitis    hx of hep C - 10-20  years ago   . History of blood transfusion   . History of kidney cancer   . Hyperlipidemia   . Hypertension    not taken b/p med in 2-3 years md deceased never went to another md     Patient Active Problem List   Diagnosis Date Noted  . Hyperkalemia, diminished renal excretion 07/27/2020  . CKD (chronic kidney disease) 05/23/2020  . Solitary kidney, acquired 05/23/2020  . Fever 04/26/2020  . Acute hypoxemic respiratory failure ([place]) 04/19/2020  . Diarrhea 04/19/2020  . Nausea & vomiting 04/19/2020  . Pneumonia 04/09/2020  . Acute renal failure ([place]) 04/09/2020  . Hypertensive urgency 04/09/2020  . History of hepatitis C 04/09/2020  . Sepsis ([place]) 04/08/2020  . Venous insufficiency 12/28/2019  . Hyperglycemia 06/29/2019  . Osteoporosis 02/20/2018  . Essential hypertension 02/15/2017  . Dyslipidemia 02/15/2017  . Renal cell carcinoma of right kidney ([place]) 02/15/2017    Past Surgical History:  Procedure Laterality Date  . CATARACT EXTRACTION Bilateral    11/30 left /12/9 right  . ROBOT ASSISTED LAPAROSCOPIC NEPHRECTOMY Right 01/13/2017   Procedure: XI ROBOTIC ASSISTED LAPAROSCOPIC RADICAL NEPHRECTOMY WITH LYSIS OF ADHESION;  Surgeon: Alexis Frock, MD;  Location: [facility];  Service: Urology;  Laterality: Right;     OB History   No obstetric history on file.     Family History  Problem Relation Age of Onset  . Hypertension Mother   . Pancreatic cancer Father   . Breast cancer Sister  Social History   Tobacco Use  . Smoking status: Former Smoker    Packs/day: 0.50    Years: 30.00    Pack years: 15.00    Types: Cigarettes    Quit date: 12/14/2000    Years since quitting: 19.6  . Smokeless tobacco: Never Used  Vaping Use  . Vaping Use: Never used  Substance Use Topics  . Alcohol use: Yes    Comment: occasional wine   . Drug use: No    Home Medications Prior to Admission medications   Medication Sig Start Date End Date Taking? Authorizing Provider    amLODipine (NORVASC) 10 MG tablet Take 1 tablet (10 mg total) by mouth daily. 04/19/20  Yes Biagio Borg, MD  Ascorbic Acid (VITAMIN C PO) Take 500 mg by mouth in the morning and at bedtime.    Yes [provider]  benzonatate (TESSALON) 200 MG capsule Take 1 capsule (200 mg total) by mouth 3 (three) times daily as needed. 07/22/20  Yes Marrian Salvage, FNP  Black Currant Seed Oil 500 MG CAPS Take 500 mg by mouth daily.   Yes [provider]  Calcium Carbonate-Vit D-Min (CALCIUM 1200 PO) Take 1,200 mg by mouth daily.    Yes [provider]  Cholecalciferol (VITAMIN D PO) Take 2,000 mcg by mouth See admin instructions. Takes 2000 mcg 3 times a week ( monday, wednesday and friday)   Yes [provider]  doxycycline (VIBRA-TABS) 100 MG tablet Take 1 tablet (100 mg total) by mouth 2 (two) times daily. 07/22/20  Yes Marrian Salvage, FNP  Garlic 5784 MG CAPS Take 2,000 mg by mouth daily.    Yes [provider]  hydrochlorothiazide (HYDRODIURIL) 25 MG tablet Take 1 tablet (25 mg total) by mouth daily. 04/19/20  Yes Biagio Borg, MD  metoprolol succinate (TOPROL-XL) 100 MG 24 hr tablet Take 1 tablet (100 mg total) by mouth daily. Take with or immediately following a meal. 04/19/20  Yes Biagio Borg, MD  Multiple Vitamin (MULTIVITAMIN WITH MINERALS) TABS tablet Take 1 tablet by mouth daily.   Yes [provider]  Omega-3 Fatty Acids (FISH OIL PO) Take 1 tablet by mouth daily.   Yes [provider]  ondansetron (ZOFRAN) 4 MG tablet Take 1 tablet (4 mg total) by mouth every 8 (eight) hours as needed for nausea or vomiting. 04/19/20  Yes Biagio Borg, MD  OVER THE COUNTER MEDICATION Take 1 capsule by mouth daily. Mega juice   Yes [provider]  rosuvastatin (CRESTOR) 10 MG tablet Take 1 tablet by mouth once daily Patient taking differently: Take 10 mg by mouth daily.  07/22/20  Yes Biagio Borg, MD  telmisartan (MICARDIS) 80 MG  tablet Take 1 tablet (80 mg total) by mouth daily. 04/26/20  Yes Biagio Borg, MD    Allergies    Patient has no known allergies.  Review of Systems   Review of Systems  Constitutional: Negative for activity change, chills and fever.  HENT: Positive for congestion. Negative for sinus pressure and sinus pain.   Eyes: Negative for visual disturbance.  Respiratory: Positive for cough and shortness of breath.   Cardiovascular: Positive for leg swelling. Negative for chest pain and palpitations.  Gastrointestinal: Positive for diarrhea, nausea and vomiting. Negative for abdominal pain.  Endocrine: Positive for polydipsia.  Genitourinary: Positive for dysuria. Negative for urgency.  Musculoskeletal: Negative for back pain.  Skin: Negative for rash.  Allergic/Immunologic: Negative for immunocompromised state.  Neurological: Negative for seizures, syncope, weakness and headaches.  Psychiatric/Behavioral: Negative for confusion.    Physical Exam Updated Vital Signs BP (!) 187/61 (BP Location: Right Arm)   Pulse 97   Temp 97.8 F (36.6 C) (Oral)   Resp 18   SpO2 99%   Physical Exam Vitals and nursing note reviewed.  Constitutional:      General: She is not in acute distress.    Comments: Nontoxic-appearing  HENT:     Head: Normocephalic.  Eyes:     Conjunctiva/sclera: Conjunctivae normal.  Cardiovascular:     Rate and Rhythm: Normal rate and regular rhythm.     Heart sounds: No murmur heard.  No friction rub. No gallop.   Pulmonary:     Effort: Pulmonary effort is normal. No respiratory distress.     Breath sounds: No stridor.     Comments: Crackles in the bilateral bases Chest:     Chest wall: No tenderness.  Abdominal:     General: There is no distension.     Palpations: Abdomen is soft.     Tenderness: There is no right CVA tenderness, left CVA tenderness, guarding or rebound.     Comments: Abdomen soft, nontender, nondistended.  Musculoskeletal:     Cervical back:  Neck supple.     Right lower leg: Edema present.     Left lower leg: Edema present.     Comments: Nonpitting edema noted to the bilateral upper and lower extremities  Skin:    General: Skin is warm.     Findings: No rash.  Neurological:     Mental Status: She is alert.  Psychiatric:        Behavior: Behavior normal.     ED Results / Procedures / Treatments   Labs (all labs ordered are listed, but only abnormal results are displayed) Labs Reviewed  CBC WITH DIFFERENTIAL/PLATELET - Abnormal; Notable for the following components:      Result Value   WBC 12.8 (*)    RDW 17.2 (*)    Neutro Abs 11.4 (*)    Lymphs Abs 0.6 (*)    Abs Immature Granulocytes 0.10 (*)    All other components within normal limits  COMPREHENSIVE METABOLIC PANEL - Abnormal; Notable for the following components:   Potassium 6.6 (*)    CO2 13 (*)    Glucose, Bld 113 (*)    BUN 115 (*)    Creatinine, Ser 12.06 (*)    Total Protein 6.4 (*)    Albumin 3.2 (*)    GFR calc non Af Amer 3 (*)    GFR calc Af Amer 3 (*)    Anion gap 16 (*)    All other components within normal limits  BASIC METABOLIC PANEL - Abnormal; Notable for the following components:   Potassium 7.2 (*)    CO2 12 (*)    Glucose, Bld 112 (*)    BUN 127 (*)    Creatinine, Ser 12.34 (*)    Calcium 8.6 (*)    GFR calc non Af Amer 3 (*)    GFR calc Af Amer 3 (*)    All other components within normal limits  MAGNESIUM - Abnormal; Notable for the following components:   Magnesium 2.5 (*)    All other components within normal limits  SARS CORONAVIRUS 2 BY RT PCR (HOSPITAL ORDER, [place])  URINALYSIS, ROUTINE W REFLEX MICROSCOPIC  HEPATITIS B CORE ANTIBODY, TOTAL  HEPATITIS B SURFACE ANTIGEN  HEPATITIS C ANTIBODY  ANCA TITERS  ANTI-DNA ANTIBODY, DOUBLE-STRANDED  ANTINUCLEAR ANTIBODIES, IFA  C3 COMPLEMENT  C4 COMPLEMENT  GLOMERULAR BASEMENT MEMBRANE ANTIBODIES  PROTEIN / CREATININE RATIO, URINE  SODIUM,  URINE, RANDOM  CREATININE, URINE, RANDOM  CK    EKG EKG Interpretation  Date/Time:  Friday July 26 2020 17:36:29 EDT Ventricular Rate:  94 PR Interval:  146 QRS Duration: 70 QT Interval:  338 QTC Calculation: 422 R Axis:   6 Text Interpretation: Normal sinus rhythm Cannot rule out Anterior infarct , age undetermined Abnormal ECG Confirmed by Davonna Belling [phone]) on 07/26/2020 11:04:01 PM   Radiology DG Chest 1 View  Result Date: 07/26/2020 CLINICAL DATA:  79 year old female with shortness of breath. EXAM: CHEST  1 VIEW COMPARISON:  Chest radiograph dated 04/26/2020 FINDINGS: Small bilateral pleural effusions with bibasilar atelectasis, increased since the prior radiograph. Pneumonia is not excluded. No pneumothorax. The cardiac borders are silhouetted. Atherosclerotic calcification of the aorta. No acute osseous pathology. IMPRESSION: Small bilateral pleural effusions with bibasilar atelectasis, increased since the prior radiograph. Electronically Signed   By: Anner Crete M.D.   On: 07/26/2020 20:24   US RENAL  Result Date: 07/27/2020 CLINICAL DATA:  Renal failure. EXAM: RENAL / URINARY TRACT ULTRASOUND COMPLETE COMPARISON:  CT 04/08/2020 FINDINGS: Right Kidney: Surgically absent. Left Kidney: Renal measurements: 9.6 x 5.4 x 4.8 cm = volume: 130 mL. Echogenicity within normal limits. No mass or hydronephrosis visualized. Bladder: Empty and not well assessed. Other: Incidental right pleural effusion. IMPRESSION: 1. Unremarkable sonographic appearance of the left kidney. 2. Right nephrectomy. 3. Urinary bladder is empty and not well assessed. 4. Right pleural effusion incidentally noted. Electronically Signed   By: Keith Rake M.D.   On: 07/27/2020 04:04    Procedures .Critical Care Performed by: Joanne Gavel, PA-C Authorized by: Joanne Gavel, PA-C   Critical care provider statement:    Critical care time (minutes):  50   Critical care time was exclusive of:   Separately billable procedures and treating other patients and teaching time   Critical care was necessary to treat or prevent imminent or life-threatening deterioration of the following conditions:  Renal failure and metabolic crisis   Critical care was time spent personally by me on the following activities:  Ordering and performing treatments and interventions, ordering and review of laboratory studies, ordering and review of radiographic studies, discussions with consultants, examination of patient, obtaining history from patient or surrogate, re-evaluation of patient's condition, review of old charts, pulse oximetry, evaluation of patient's response to treatment and development of treatment plan with patient or surrogate   I assumed direction of critical care for this patient from another provider in my specialty: no     (including critical care time)  Medications Ordered in ED Medications  sodium zirconium cyclosilicate (LOKELMA) packet 10 g (10 g Oral Given 07/27/20 0425)  sodium zirconium cyclosilicate (LOKELMA) packet 10 g (10 g Oral Given 07/27/20 0056)  ondansetron (ZOFRAN) injection 4 mg (4 mg Intravenous Given 07/27/20 0122)  sodium chloride 0.9 % bolus 1,000 mL (1,000 mLs Intravenous Bolus from Bag 07/27/20 0121)  amLODipine (NORVASC) tablet 10 mg (10 mg Oral Given 07/27/20 0143)  sodium bicarbonate injection 50 mEq (50 mEq Intravenous Given 07/27/20 0216)  dextrose 50 % solution 50 mL (50 mLs Intravenous Given 07/27/20 0425)  insulin aspart (novoLOG) injection 6 Units (6 Units Intravenous Given 07/27/20 0256)  calcium gluconate 1 g/ 50 mL sodium chloride IVPB (0 g Intravenous Stopped 07/27/20 0419)  ED Course  I have reviewed the triage vital signs and the nursing notes.  Pertinent labs & imaging results that were available during my care of the patient were reviewed by me and considered in my medical decision making (see chart for details).  Clinical Course as of Jul 27 436  Sat  Jul 27, 2020  0221 Notified by RN that repeat potassium is 7.2.  Of note, repeat metabolic panel was collected around the time that [facility] was initiated.  Repeat EKG obtained at time of results of potassium of 7.2.  She continues to be in sinus rhythm.  No changes from previous EKG.  Calcium gluconate and sodium bicarb have been initiated.  Dr. Alvino Chapel made aware of repeat labs.  Patient re-evaluated at time of results and she is in no acute distress and remains free of chest pain.   [MM]    Clinical Course User Index [MM] Laquinton Bihm, Laymond Purser, PA-C   MDM Rules/Calculators/A&P                          79 year old female with history of renal cell carcinoma s/p right nephrectomy in 2018, HTN, HLD, remote hepatitis C, and COPD who presents with vomiting and diarrhea, progressively worsening shortness of breath, peripheral edema, cough, nasal congestion.  No documented hypoxia, but patient was placed on 2 L nasal cannula by triage staff.  She does not wear home oxygen.  She is hypertensive.  Afebrile.  No tachycardia.  Creatinine is 12, up from 1.59 in May 2021.  Patient also has peripheral edema and has bilateral pleural effusions on my evaluation of chest x-ray.  She appears volume overloaded.  She is not on hemodialysis and has no history of hemodialysis.  Patient also previously had right nephrectomy secondary to renal cell carcinoma in 2018.  Potassium is 6.6.  Patient adamantly denies chest pain.  Troponin is elevated, likely secondary to acute renal failure.  EKG initially did not demonstrate peaked T waves, but repeat EKG is of better quality and does demonstrate peaked T waves.  We will start patient on [facility] and consult nephrology.  She is also endorsing dysuria.  Urinalysis is pending.  TREATMENTS: Start patient on [place] for hyperkalemia.  Will give IV fluid bolus and Zofran for nausea. Amlodipine given for hypertension.  CONSULT to nephrology and spoke with Dr. Augustin Coupe. No  recommendations from nephrology at this time.  CONSULT to hospitalist team and spoke with Dr. Cyd Silence who accept the patient for admission as patient has critically ill and requires inpatient admission for further work-up and evaluation.  The patient appears reasonably stabilized for admission considering the current resources, flow, and capabilities available in the ED at this time, and I doubt any other [facility] requiring further screening and/or treatment in the ED prior to admission.   Final Clinical Impression(s) / ED Diagnoses Final diagnoses:  Acute renal failure, unspecified acute renal failure type ([place])  Hyperkalemia    Rx / DC Orders ED Discharge Orders    None       Joanne Gavel, PA-C 07/27/20 [place], Nathan, MD 07/27/20 [phone]

## 2020-07-27 ENCOUNTER — Encounter (HOSPITAL_COMMUNITY): Payer: Self-pay | Admitting: Internal Medicine

## 2020-07-27 ENCOUNTER — Inpatient Hospital Stay (HOSPITAL_COMMUNITY): Payer: Medicare Other

## 2020-07-27 ENCOUNTER — Emergency Department (HOSPITAL_COMMUNITY): Payer: Medicare Other

## 2020-07-27 DIAGNOSIS — Z992 Dependence on renal dialysis: Secondary | ICD-10-CM | POA: Diagnosis not present

## 2020-07-27 DIAGNOSIS — Z79899 Other long term (current) drug therapy: Secondary | ICD-10-CM | POA: Diagnosis not present

## 2020-07-27 DIAGNOSIS — Z87891 Personal history of nicotine dependence: Secondary | ICD-10-CM | POA: Diagnosis not present

## 2020-07-27 DIAGNOSIS — Z8619 Personal history of other infectious and parasitic diseases: Secondary | ICD-10-CM | POA: Diagnosis not present

## 2020-07-27 DIAGNOSIS — Z9841 Cataract extraction status, right eye: Secondary | ICD-10-CM | POA: Diagnosis not present

## 2020-07-27 DIAGNOSIS — N19 Unspecified kidney failure: Secondary | ICD-10-CM | POA: Diagnosis not present

## 2020-07-27 DIAGNOSIS — I129 Hypertensive chronic kidney disease with stage 1 through stage 4 chronic kidney disease, or unspecified chronic kidney disease: Secondary | ICD-10-CM | POA: Diagnosis not present

## 2020-07-27 DIAGNOSIS — N170 Acute kidney failure with tubular necrosis: Secondary | ICD-10-CM | POA: Diagnosis not present

## 2020-07-27 DIAGNOSIS — I10 Essential (primary) hypertension: Secondary | ICD-10-CM | POA: Diagnosis not present

## 2020-07-27 DIAGNOSIS — I120 Hypertensive chronic kidney disease with stage 5 chronic kidney disease or end stage renal disease: Secondary | ICD-10-CM | POA: Diagnosis not present

## 2020-07-27 DIAGNOSIS — N179 Acute kidney failure, unspecified: Secondary | ICD-10-CM | POA: Diagnosis not present

## 2020-07-27 DIAGNOSIS — Z66 Do not resuscitate: Secondary | ICD-10-CM | POA: Diagnosis not present

## 2020-07-27 DIAGNOSIS — J9811 Atelectasis: Secondary | ICD-10-CM | POA: Diagnosis not present

## 2020-07-27 DIAGNOSIS — Z8249 Family history of ischemic heart disease and other diseases of the circulatory system: Secondary | ICD-10-CM | POA: Diagnosis not present

## 2020-07-27 DIAGNOSIS — E875 Hyperkalemia: Secondary | ICD-10-CM

## 2020-07-27 DIAGNOSIS — R05 Cough: Secondary | ICD-10-CM | POA: Diagnosis not present

## 2020-07-27 DIAGNOSIS — N186 End stage renal disease: Secondary | ICD-10-CM | POA: Diagnosis not present

## 2020-07-27 DIAGNOSIS — E872 Acidosis, unspecified: Secondary | ICD-10-CM | POA: Diagnosis present

## 2020-07-27 DIAGNOSIS — N183 Chronic kidney disease, stage 3 unspecified: Secondary | ICD-10-CM | POA: Diagnosis not present

## 2020-07-27 DIAGNOSIS — Z803 Family history of malignant neoplasm of breast: Secondary | ICD-10-CM | POA: Diagnosis not present

## 2020-07-27 DIAGNOSIS — J449 Chronic obstructive pulmonary disease, unspecified: Secondary | ICD-10-CM | POA: Diagnosis not present

## 2020-07-27 DIAGNOSIS — D696 Thrombocytopenia, unspecified: Secondary | ICD-10-CM | POA: Diagnosis not present

## 2020-07-27 DIAGNOSIS — J90 Pleural effusion, not elsewhere classified: Secondary | ICD-10-CM | POA: Diagnosis not present

## 2020-07-27 DIAGNOSIS — E785 Hyperlipidemia, unspecified: Secondary | ICD-10-CM | POA: Diagnosis present

## 2020-07-27 DIAGNOSIS — Z85528 Personal history of other malignant neoplasm of kidney: Secondary | ICD-10-CM | POA: Diagnosis not present

## 2020-07-27 DIAGNOSIS — E782 Mixed hyperlipidemia: Secondary | ICD-10-CM | POA: Diagnosis not present

## 2020-07-27 DIAGNOSIS — Z9842 Cataract extraction status, left eye: Secondary | ICD-10-CM | POA: Diagnosis not present

## 2020-07-27 DIAGNOSIS — E860 Dehydration: Secondary | ICD-10-CM | POA: Diagnosis present

## 2020-07-27 DIAGNOSIS — D509 Iron deficiency anemia, unspecified: Secondary | ICD-10-CM | POA: Diagnosis present

## 2020-07-27 DIAGNOSIS — Z905 Acquired absence of kidney: Secondary | ICD-10-CM | POA: Diagnosis not present

## 2020-07-27 DIAGNOSIS — D631 Anemia in chronic kidney disease: Secondary | ICD-10-CM | POA: Diagnosis not present

## 2020-07-27 DIAGNOSIS — R197 Diarrhea, unspecified: Secondary | ICD-10-CM | POA: Diagnosis present

## 2020-07-27 DIAGNOSIS — R0600 Dyspnea, unspecified: Secondary | ICD-10-CM | POA: Diagnosis not present

## 2020-07-27 DIAGNOSIS — R0602 Shortness of breath: Secondary | ICD-10-CM | POA: Diagnosis not present

## 2020-07-27 DIAGNOSIS — Z4901 Encounter for fitting and adjustment of extracorporeal dialysis catheter: Secondary | ICD-10-CM | POA: Diagnosis not present

## 2020-07-27 DIAGNOSIS — N185 Chronic kidney disease, stage 5: Secondary | ICD-10-CM | POA: Diagnosis not present

## 2020-07-27 DIAGNOSIS — Z800 Family history of malignant neoplasm of digestive organs: Secondary | ICD-10-CM | POA: Diagnosis not present

## 2020-07-27 DIAGNOSIS — Z20822 Contact with and (suspected) exposure to covid-19: Secondary | ICD-10-CM | POA: Diagnosis not present

## 2020-07-27 DIAGNOSIS — R1110 Vomiting, unspecified: Secondary | ICD-10-CM | POA: Diagnosis not present

## 2020-07-27 HISTORY — PX: IR US GUIDE VASC ACCESS RIGHT: IMG2390

## 2020-07-27 HISTORY — PX: IR FLUORO GUIDE CV LINE RIGHT: IMG2283

## 2020-07-27 LAB — HEPATITIS B SURFACE ANTIGEN: Hepatitis B Surface Ag: NONREACTIVE

## 2020-07-27 LAB — CBC WITH DIFFERENTIAL/PLATELET
Abs Immature Granulocytes: 0.08 10*3/uL — ABNORMAL HIGH (ref 0.00–0.07)
Basophils Absolute: 0 10*3/uL (ref 0.0–0.1)
Basophils Relative: 0 %
Eosinophils Absolute: 0 10*3/uL (ref 0.0–0.5)
Eosinophils Relative: 0 %
HCT: 38.9 % (ref 36.0–46.0)
Hemoglobin: 12.4 g/dL (ref 12.0–15.0)
Immature Granulocytes: 1 %
Lymphocytes Relative: 4 %
Lymphs Abs: 0.5 10*3/uL — ABNORMAL LOW (ref 0.7–4.0)
MCH: 29.1 pg (ref 26.0–34.0)
MCHC: 31.9 g/dL (ref 30.0–36.0)
MCV: 91.3 fL (ref 80.0–100.0)
Monocytes Absolute: 0.7 10*3/uL (ref 0.1–1.0)
Monocytes Relative: 5 %
Neutro Abs: 11.9 10*3/uL — ABNORMAL HIGH (ref 1.7–7.7)
Neutrophils Relative %: 90 %
Platelets: 218 10*3/uL (ref 150–400)
RBC: 4.26 MIL/uL (ref 3.87–5.11)
RDW: 16.9 % — ABNORMAL HIGH (ref 11.5–15.5)
WBC: 13.2 10*3/uL — ABNORMAL HIGH (ref 4.0–10.5)
nRBC: 0.2 % (ref 0.0–0.2)

## 2020-07-27 LAB — RENAL FUNCTION PANEL
Albumin: 2.5 g/dL — ABNORMAL LOW (ref 3.5–5.0)
Anion gap: 14 (ref 5–15)
BUN: 128 mg/dL — ABNORMAL HIGH (ref 8–23)
CO2: 14 mmol/L — ABNORMAL LOW (ref 22–32)
Calcium: 8.4 mg/dL — ABNORMAL LOW (ref 8.9–10.3)
Chloride: 110 mmol/L (ref 98–111)
Creatinine, Ser: 12.33 mg/dL — ABNORMAL HIGH (ref 0.44–1.00)
GFR calc Af Amer: 3 mL/min — ABNORMAL LOW (ref >60)
GFR calc non Af Amer: 3 mL/min — ABNORMAL LOW (ref >60)
Glucose, Bld: 146 mg/dL — ABNORMAL HIGH (ref 70–99)
Phosphorus: 7.4 mg/dL — ABNORMAL HIGH (ref 2.5–4.6)
Potassium: 5.6 mmol/L — ABNORMAL HIGH (ref 3.5–5.1)
Sodium: 138 mmol/L (ref 135–145)

## 2020-07-27 LAB — PROTIME-INR
INR: 1.3 — ABNORMAL HIGH (ref 0.8–1.2)
Prothrombin Time: 15.4 seconds — ABNORMAL HIGH (ref 11.4–15.2)

## 2020-07-27 LAB — BASIC METABOLIC PANEL
Anion gap: 14 (ref 5–15)
BUN: 127 mg/dL — ABNORMAL HIGH (ref 8–23)
CO2: 12 mmol/L — ABNORMAL LOW (ref 22–32)
Calcium: 8.6 mg/dL — ABNORMAL LOW (ref 8.9–10.3)
Chloride: 111 mmol/L (ref 98–111)
Creatinine, Ser: 12.34 mg/dL — ABNORMAL HIGH (ref 0.44–1.00)
GFR calc Af Amer: 3 mL/min — ABNORMAL LOW (ref >60)
GFR calc non Af Amer: 3 mL/min — ABNORMAL LOW (ref >60)
Glucose, Bld: 112 mg/dL — ABNORMAL HIGH (ref 70–99)
Potassium: 7.2 mmol/L (ref 3.5–5.1)
Sodium: 137 mmol/L (ref 135–145)

## 2020-07-27 LAB — MRSA PCR SCREENING: MRSA by PCR: NEGATIVE

## 2020-07-27 LAB — HEPATITIS C ANTIBODY: HCV Ab: NONREACTIVE

## 2020-07-27 LAB — MAGNESIUM: Magnesium: 2.5 mg/dL — ABNORMAL HIGH (ref 1.7–2.4)

## 2020-07-27 LAB — CK: Total CK: 147 U/L (ref 38–234)

## 2020-07-27 LAB — HEPATITIS B CORE ANTIBODY, TOTAL: Hep B Core Total Ab: REACTIVE — AB

## 2020-07-27 LAB — LACTIC ACID, PLASMA: Lactic Acid, Venous: 1.3 mmol/L (ref 0.5–1.9)

## 2020-07-27 LAB — APTT: aPTT: 31 seconds (ref 24–36)

## 2020-07-27 MED ORDER — CALCIUM GLUCONATE-NACL 1-0.675 GM/50ML-% IV SOLN
1.0000 g | Freq: Once | INTRAVENOUS | Status: AC
  Administered 2020-07-27: 1000 mg via INTRAVENOUS
  Filled 2020-07-27: qty 50

## 2020-07-27 MED ORDER — LIDOCAINE HCL (PF) 1 % IJ SOLN
INTRAMUSCULAR | Status: AC | PRN
  Administered 2020-07-27: 10 mL

## 2020-07-27 MED ORDER — MIDAZOLAM HCL 2 MG/2ML IJ SOLN
INTRAMUSCULAR | Status: AC
  Filled 2020-07-27: qty 4

## 2020-07-27 MED ORDER — AMLODIPINE BESYLATE 5 MG PO TABS
10.0000 mg | ORAL_TABLET | Freq: Once | ORAL | Status: AC
  Administered 2020-07-27: 10 mg via ORAL
  Filled 2020-07-27: qty 2

## 2020-07-27 MED ORDER — ADULT MULTIVITAMIN W/MINERALS CH
1.0000 | ORAL_TABLET | Freq: Every day | ORAL | Status: DC
  Administered 2020-07-27 – 2020-08-08 (×13): 1 via ORAL
  Filled 2020-07-27 (×13): qty 1

## 2020-07-27 MED ORDER — STERILE WATER FOR INJECTION IV SOLN
INTRAVENOUS | Status: DC
  Filled 2020-07-27 (×2): qty 850

## 2020-07-27 MED ORDER — DEXTROSE 50 % IV SOLN
1.0000 | Freq: Once | INTRAVENOUS | Status: AC
  Administered 2020-07-27: 50 mL via INTRAVENOUS
  Filled 2020-07-27: qty 50

## 2020-07-27 MED ORDER — ONDANSETRON HCL 4 MG/2ML IJ SOLN
4.0000 mg | Freq: Four times a day (QID) | INTRAMUSCULAR | Status: DC | PRN
  Administered 2020-08-01 – 2020-08-04 (×4): 4 mg via INTRAVENOUS
  Filled 2020-07-27 (×4): qty 2

## 2020-07-27 MED ORDER — POLYETHYLENE GLYCOL 3350 17 G PO PACK: 17.0000 g | PACK | Freq: Every day | ORAL | Status: DC | PRN

## 2020-07-27 MED ORDER — MIDAZOLAM HCL 2 MG/2ML IJ SOLN
INTRAMUSCULAR | Status: AC | PRN
  Administered 2020-07-27: 1 mg via INTRAVENOUS

## 2020-07-27 MED ORDER — SODIUM CHLORIDE 0.9 % IV SOLN
1.0000 g | Freq: Once | INTRAVENOUS | Status: DC
  Filled 2020-07-27: qty 10

## 2020-07-27 MED ORDER — CEFAZOLIN SODIUM-DEXTROSE 2-4 GM/100ML-% IV SOLN
INTRAVENOUS | Status: AC | PRN
  Administered 2020-07-27: 2 g via INTRAVENOUS

## 2020-07-27 MED ORDER — CHLORHEXIDINE GLUCONATE CLOTH 2 % EX PADS
6.0000 | MEDICATED_PAD | Freq: Every day | CUTANEOUS | Status: DC
  Administered 2020-07-29 – 2020-08-08 (×10): 6 via TOPICAL

## 2020-07-27 MED ORDER — SODIUM ZIRCONIUM CYCLOSILICATE 10 G PO PACK
10.0000 g | PACK | Freq: Two times a day (BID) | ORAL | Status: DC
  Administered 2020-07-27 – 2020-07-29 (×4): 10 g via ORAL
  Filled 2020-07-27 (×5): qty 1

## 2020-07-27 MED ORDER — METOPROLOL SUCCINATE ER 100 MG PO TB24
100.0000 mg | ORAL_TABLET | Freq: Every day | ORAL | Status: DC
  Administered 2020-07-27 – 2020-07-31 (×5): 100 mg via ORAL
  Filled 2020-07-27 (×5): qty 1

## 2020-07-27 MED ORDER — HEPARIN SODIUM (PORCINE) 5000 UNIT/ML IJ SOLN
5000.0000 [IU] | Freq: Three times a day (TID) | INTRAMUSCULAR | Status: DC
  Administered 2020-07-27 – 2020-08-08 (×34): 5000 [IU] via SUBCUTANEOUS
  Filled 2020-07-27 (×35): qty 1

## 2020-07-27 MED ORDER — FENTANYL CITRATE (PF) 100 MCG/2ML IJ SOLN
INTRAMUSCULAR | Status: AC | PRN
  Administered 2020-07-27: 50 ug via INTRAVENOUS

## 2020-07-27 MED ORDER — ROSUVASTATIN CALCIUM 5 MG PO TABS
10.0000 mg | ORAL_TABLET | Freq: Every day | ORAL | Status: DC
  Administered 2020-07-27 – 2020-08-08 (×13): 10 mg via ORAL
  Filled 2020-07-27 (×13): qty 2

## 2020-07-27 MED ORDER — FENTANYL CITRATE (PF) 100 MCG/2ML IJ SOLN
INTRAMUSCULAR | Status: AC
  Filled 2020-07-27: qty 2

## 2020-07-27 MED ORDER — CEFAZOLIN SODIUM-DEXTROSE 2-4 GM/100ML-% IV SOLN
INTRAVENOUS | Status: AC
  Filled 2020-07-27: qty 100

## 2020-07-27 MED ORDER — AMLODIPINE BESYLATE 10 MG PO TABS
10.0000 mg | ORAL_TABLET | Freq: Every day | ORAL | Status: DC
  Administered 2020-07-27 – 2020-08-08 (×12): 10 mg via ORAL
  Filled 2020-07-27 (×12): qty 1

## 2020-07-27 MED ORDER — SODIUM CHLORIDE 0.9 % IV SOLN: INTRAVENOUS | Status: AC

## 2020-07-27 MED ORDER — INSULIN ASPART 100 UNIT/ML IV SOLN
6.0000 [IU] | Freq: Once | INTRAVENOUS | Status: AC
  Administered 2020-07-27: 6 [IU] via INTRAVENOUS

## 2020-07-27 MED ORDER — LACTATED RINGERS IV SOLN: INTRAVENOUS | Status: DC

## 2020-07-27 MED ORDER — SODIUM BICARBONATE 8.4 % IV SOLN
50.0000 meq | Freq: Once | INTRAVENOUS | Status: AC
  Administered 2020-07-27: 50 meq via INTRAVENOUS
  Filled 2020-07-27: qty 50

## 2020-07-27 MED ORDER — ONDANSETRON HCL 4 MG PO TABS: 4.0000 mg | ORAL_TABLET | Freq: Four times a day (QID) | ORAL | Status: DC | PRN

## 2020-07-27 MED ORDER — LIDOCAINE HCL 1 % IJ SOLN
INTRAMUSCULAR | Status: AC
  Filled 2020-07-27: qty 20

## 2020-07-27 MED ORDER — HYDRALAZINE HCL 20 MG/ML IJ SOLN
10.0000 mg | Freq: Four times a day (QID) | INTRAMUSCULAR | Status: DC | PRN
  Administered 2020-07-27: 10 mg via INTRAVENOUS
  Filled 2020-07-27: qty 1

## 2020-07-27 MED ORDER — HEPARIN SODIUM (PORCINE) 1000 UNIT/ML IJ SOLN: INTRAMUSCULAR | Status: AC | PRN

## 2020-07-27 MED ORDER — ONDANSETRON HCL 4 MG/2ML IJ SOLN
INTRAMUSCULAR | Status: AC
  Administered 2020-07-27: 4 mg via INTRAVENOUS
  Filled 2020-07-27: qty 2

## 2020-07-27 MED ORDER — ACETAMINOPHEN 325 MG PO TABS
650.0000 mg | ORAL_TABLET | Freq: Four times a day (QID) | ORAL | Status: DC | PRN
  Administered 2020-08-07: 650 mg via ORAL
  Filled 2020-07-27: qty 2

## 2020-07-27 MED ORDER — ACETAMINOPHEN 650 MG RE SUPP: 650.0000 mg | Freq: Four times a day (QID) | RECTAL | Status: DC | PRN

## 2020-07-27 MED ORDER — HEPARIN SODIUM (PORCINE) 1000 UNIT/ML IJ SOLN
INTRAMUSCULAR | Status: AC
  Administered 2020-07-27: 3.2 mL
  Filled 2020-07-27: qty 1

## 2020-07-27 MED ORDER — FUROSEMIDE 10 MG/ML IJ SOLN
120.0000 mg | Freq: Once | INTRAVENOUS | Status: AC
  Administered 2020-07-27: 120 mg via INTRAVENOUS
  Filled 2020-07-27: qty 2

## 2020-07-27 NOTE — Progress Notes (Signed)
PROGRESS NOTE  JAIAH WEIGEL  DOB: February 04, 1941  PCP: Biagio Borg, MD VVO:[id number]  DOA: 07/26/2020  LOS: 0 days   Chief Complaint  Patient presents with  . Vomiting   Brief narrative: TUNISHA RULAND is a 79 y.o. female with PMH of HTN, HLD, CKD stage III, hepatitis C, chronic anemia, COPD, right nephrectomy in 2018 for renal cell cancer. Patient presented to the ED on 07/26/2020 with complaint of vomiting for 24 hours, nonbloody, nonbilious leading to generalized weakness.  In the ED, she was noted to have potassium elevated to 7.2, creatinine significantly elevated 12.3. EKG changes minimal. Patient was given Lokelma, IV dextrose, insulin, calcium gluconate and sodium bicarbonate. Nephrology consultation was obtained. Patient was admitted to hospitalist service.   Subjective: Patient was seen and examined this morning. States that she is having diarrhea for last several days.  Alert, awake, oriented to place and person. Not in pain. Chart reviewed.   No fever.  Heart rate in 90s, blood pressure 180s to 190s, on 3 L oxygen by nasal cannula. Repeat labs this morning showed potassium improvement of 5.6, creatinine remains elevated at 12.33.  Assessment/Plan: AKI on CKD 3 Severe metabolic acidosis History of right nephrectomy secondary to RCC -Baseline creatinine 1.3-1.6 in recent months -Presented with significant elevated creatinine to 12.3 and serum bicarb low at 13 -Unclear cause of acute deterioration of kidney function. -Nephrology involved.  Noted plan for serological work-up. -Renal ultrasound ruled out postoperative course. -Noted plan for tunnel catheter placement and initiation of dialysis today. -Nephrology has started the patient on sodium bicarb at 50 mill per hour.  Acute hyperkalemia -Presented with calcium at 7.2. -Temporized in the ED with Lokelma, IV dextrose, insulin, calcium gluconate and sodium bicarbonate -Repeat potassium this morning is  5.8. -Defer to nephrology for need of dialysis.  Diarrhea -For last 3 to 4 days.  Probably causing dehydration and renal failure. -GI pathogen panel ordered.   Essential hypertension -Blood pressure in last 24 hours has remained elevated to 180s and 190s. -Home meds include Toprol 100 g daily, amlodipine 10 mg daily, HCTZ 25 mg daily, telmisartan 80 mg daily. -Continue Toprol and amlodipine.  Keep HCT intermittent on hold. -IV hydralazine as needed.  Mixed hyperlipidemia -Continue Crestor   History of COPD -Stable respiratory status -As needed inhaler  Mobility: Encourage ambulation Code Status:   Code Status: DNR  Nutritional status: Body mass index is 29.96 kg/m.     Diet Order            Diet NPO time specified  Diet effective now                 DVT prophylaxis: heparin injection 5,000 Units Start: 07/27/20 0600   Antimicrobials:  None Fluid: Sodium bicarbonate 50 mill per hour  Consultants: Nephrology, IR Family Communication:  None at bedside  Status is: Inpatient  Remains inpatient appropriate because:Persistent severe electrolyte disturbances, Ongoing diagnostic testing needed not appropriate for outpatient work up and IV treatments appropriate due to intensity of illness or inability to take PO   Dispo: The patient is from: Home              Anticipated d/c is to: Home              Anticipated d/c date is: > 3 days              Patient currently is not medically stable to d/c.  Infusions:  . sodium chloride 75 mL/hr at 07/27/20 0800  . ceFAZolin    .  ceFAZolin (ANCEF) IV 2 g (07/27/20 1253)  .  sodium bicarbonate (isotonic) infusion in sterile water 50 mL/hr at 07/27/20 0836    Scheduled Meds: . amLODipine  10 mg Oral Daily  . Chlorhexidine Gluconate Cloth  6 each Topical Q0600  . fentaNYL      . heparin  5,000 Units Subcutaneous Q8H  . heparin sodium (porcine)      . lidocaine      . metoprolol succinate  100 mg Oral Daily  .  midazolam      . multivitamin with minerals  1 tablet Oral Daily  . rosuvastatin  10 mg Oral Daily  . sodium zirconium cyclosilicate  10 g Oral BID    Antimicrobials: Anti-infectives (From admission, onward)   Start     Dose/Rate Route Frequency Ordered Stop   07/27/20 1253  ceFAZolin (ANCEF) IVPB 2g/100 mL premix     Discontinue     over 30 Minutes Intravenous Continuous PRN 07/27/20 1253     07/27/20 1244  ceFAZolin (ANCEF) 2-4 GM/100ML-% IVPB       Note to Pharmacy: Margaretmary Dys   : cabinet override      07/27/20 1244 07/28/20 0044      PRN meds: acetaminophen **OR** acetaminophen, ceFAZolin (ANCEF) IV, fentaNYL, hydrALAZINE, midazolam, ondansetron **OR** ondansetron (ZOFRAN) IV, polyethylene glycol   Objective: Vitals:   07/27/20 1052 07/27/20 1234  BP: (!) 164/68 (!) 165/76  Pulse: 95 93  Resp: 20 18  Temp: 98.2 F (36.8 C)   SpO2: 97% 98%    Intake/Output Summary (Last 24 hours) at 07/27/2020 1253 Last data filed at 07/27/2020 0800 Gross per 24 hour  Intake 174.43 ml  Output --  Net 174.43 ml   Filed Weights   07/27/20 0450  Weight: 74.3 kg   Weight change:  Body mass index is 29.96 kg/m.   Physical Exam: General exam: Appears calm and comfortable.  Not in physical distress Skin: No rashes, lesions or ulcers. HEENT: Atraumatic, normocephalic, supple neck, no obvious bleeding Lungs: Clear to auscultation bilaterally CVS: Regular rate and rhythm, no murmur GI/Abd soft, nontender, nondistended, bowel sound present CNS: Alert, awake, oriented to place and person Psychiatry: Mood appropriate Extremities: No edema, no calf tenderness  Data Review: I have personally reviewed the laboratory data and studies available.  Recent Labs  Lab 07/26/20 1747 07/27/20 0734  WBC 12.8* 13.2*  NEUTROABS 11.4* 11.9*  HGB 13.4 12.4  HCT 43.5 38.9  MCV 93.8 91.3  PLT 242 218   Recent Labs  Lab 07/26/20 1747 07/27/20 0115 07/27/20 0734  NA 139 137 138  K 6.6*  7.2* 5.6*  CL 110 111 110  CO2 13* 12* 14*  GLUCOSE 113* 112* 146*  BUN 115* 127* 128*  CREATININE 12.06* 12.34* 12.33*  CALCIUM 9.2 8.6* 8.4*  MG  --  2.5*  --   PHOS  --   --  7.4*   Lab Results  Component Value Date   HGBA1C 5.6 04/08/2020       Component Value Date/Time   CHOL 112 06/29/2019 1622   TRIG 172.0 (H) 06/29/2019 1622   HDL 32.30 (L) 06/29/2019 1622   CHOLHDL 3 06/29/2019 1622   VLDL 34.4 06/29/2019 1622   LDLCALC 45 06/29/2019 1622    Signed, Terrilee Croak, MD [facility] Pager: [phone] (Secure Chat preferred). 07/27/2020

## 2020-07-27 NOTE — H&P (Addendum)
History and Physical    Cassandra Dyer CBJ:[id number] DOB: January 19, 1941 DOA: 07/26/2020  PCP: Biagio Borg, MD  Patient coming from: Home   Chief Complaint:  Chief Complaint  Patient presents with  . Vomiting     HPI:    79 year old female with past medical history of hyperlipidemia, remote history of hepatitis C, COPD, hypertension, chronic kidney disease stage IIIb and renal cell carcinoma status post right nephrectomy (2018) presenting to [facility] emergency department with complaints of vomiting and weakness.  Patient explains that approximately 1 week ago she began to develop generalized weakness. This generalized weakness was initially mild in intensity but progressively worsened over the next several days. Generalized weakness was worse with exertion and improved with rest. As the patient's symptoms continue to worsen the patient developed a nonproductive cough and mild bilateral lower extremity swelling.  Patient symptoms continue to worsen until approximately 2 days ago when the patient also began to develop severe nausea and frequent bouts of vomiting. Because of the patient's frequent bouts of vomiting she was unable to tolerate any oral intake for approximately 2 days prior to her presentation.  Patient symptoms continue to worsen until she eventually presented to [facility] emergency department for evaluation. Upon evaluation in the emergency department patient was found to be hyperkalemic with potassium of 7.2 as well as suffering a new onset severe acute kidney injury with creatinine of 12.34. Patient was administered Lokelma, dextrose, insulin, calcium gluconate and sodium bicarbonate. Dr. Augustin Coupe with nephrology was called by the emergency department staff who graciously came and evaluated the patient at the bedside. The hospitalist group was then called to assess the patient for admission to the hospital.  Upon further questioning patient denies regular NSAID  use, illicit drug use or new herbal supplement use. Patient explains that the most recent prescription medications she has been placed on or hydrochlorothiazide and telmisartan in May by her primary care provider.   Review of Systems:   Review of Systems  Constitutional: Positive for malaise/fatigue.  Gastrointestinal: Positive for nausea and vomiting.  Neurological: Positive for weakness.  All other systems reviewed and are negative.    Past Medical History:  Diagnosis Date  . Anemia   . Hepatitis    hx of hep C - 10-20 years ago   . History of blood transfusion   . History of kidney cancer   . Hyperlipidemia   . Hypertension    not taken b/p med in 2-3 years md deceased never went to another md     Past Surgical History:  Procedure Laterality Date  . CATARACT EXTRACTION Bilateral    11/30 left /12/9 right  . ROBOT ASSISTED LAPAROSCOPIC NEPHRECTOMY Right 01/13/2017   Procedure: XI ROBOTIC ASSISTED LAPAROSCOPIC RADICAL NEPHRECTOMY WITH LYSIS OF ADHESION;  Surgeon: Alexis Frock, MD;  Location: [facility] ORS;  Service: Urology;  Laterality: Right;     reports that she quit smoking about 19 years ago. Her smoking use included cigarettes. She has a 15.00 pack-year smoking history. She has never used smokeless tobacco. She reports current alcohol use. She reports that she does not use drugs.  No Known Allergies  Family History  Problem Relation Age of Onset  . Hypertension Mother   . Pancreatic cancer Father   . Breast cancer Sister      Prior to Admission medications   Medication Sig Start Date End Date Taking? Authorizing Provider  amLODipine (NORVASC) 10 MG tablet Take 1 tablet (10 mg total)  by mouth daily. 04/19/20  Yes Biagio Borg, MD  Ascorbic Acid (VITAMIN C PO) Take 500 mg by mouth in the morning and at bedtime.    Yes [provider]  benzonatate (TESSALON) 200 MG capsule Take 1 capsule (200 mg total) by mouth 3 (three) times daily as needed. 07/22/20  Yes  Marrian Salvage, FNP  Black Currant Seed Oil 500 MG CAPS Take 500 mg by mouth daily.   Yes [provider]  Calcium Carbonate-Vit D-Min (CALCIUM 1200 PO) Take 1,200 mg by mouth daily.    Yes [provider]  Cholecalciferol (VITAMIN D PO) Take 2,000 mcg by mouth See admin instructions. Takes 2000 mcg 3 times a week ( monday, wednesday and friday)   Yes [provider]  doxycycline (VIBRA-TABS) 100 MG tablet Take 1 tablet (100 mg total) by mouth 2 (two) times daily. 07/22/20  Yes Marrian Salvage, FNP  Garlic 7858 MG CAPS Take 2,000 mg by mouth daily.    Yes [provider]  hydrochlorothiazide (HYDRODIURIL) 25 MG tablet Take 1 tablet (25 mg total) by mouth daily. 04/19/20  Yes Biagio Borg, MD  metoprolol succinate (TOPROL-XL) 100 MG 24 hr tablet Take 1 tablet (100 mg total) by mouth daily. Take with or immediately following a meal. 04/19/20  Yes Biagio Borg, MD  Multiple Vitamin (MULTIVITAMIN WITH MINERALS) TABS tablet Take 1 tablet by mouth daily.   Yes [provider]  Omega-3 Fatty Acids (FISH OIL PO) Take 1 tablet by mouth daily.   Yes [provider]  ondansetron (ZOFRAN) 4 MG tablet Take 1 tablet (4 mg total) by mouth every 8 (eight) hours as needed for nausea or vomiting. 04/19/20  Yes Biagio Borg, MD  OVER THE COUNTER MEDICATION Take 1 capsule by mouth daily. Mega juice   Yes [provider]  rosuvastatin (CRESTOR) 10 MG tablet Take 1 tablet by mouth once daily Patient taking differently: Take 10 mg by mouth daily.  07/22/20  Yes Biagio Borg, MD  telmisartan (MICARDIS) 80 MG tablet Take 1 tablet (80 mg total) by mouth daily. 04/26/20  Yes Biagio Borg, MD    Physical Exam: Vitals:   07/27/20 0201 07/27/20 0230 07/27/20 0342 07/27/20 0430  BP: (!) 185/78 (!) 188/59 (!) 192/66 (!) 187/61  Pulse: 94 96 97 97  Resp: 17 (!) 22 18 18   Temp:   97.8 F (36.6 C)   TempSrc:   Oral   SpO2: 100% 100% 99% 99%    Constitutional: Lethargic but arousable and oriented x3, no associated distress.   Skin: no rashes, no lesions, good skin turgor noted. Eyes: Pupils are equally reactive to light. Increased conjunctival pallor noted without scleral icterus. ENMT: Moist mucous membranes noted.  Posterior pharynx clear of any exudate or lesions.   Neck: normal, supple, no masses, no thyromegaly.  No evidence of jugular venous distension.   Respiratory: clear to auscultation bilaterally, no wheezing, no crackles. Normal respiratory effort. No accessory muscle use.  Cardiovascular: Regular rate and rhythm, no murmurs / rubs / gallops. Trace bilateral lower extremity pitting edema. 2+ pedal pulses. No carotid bruits.  Chest:   Nontender without crepitus or deformity.   Back:   Nontender without crepitus or deformity. Abdomen: Abdomen is soft and nontender.  No evidence of intra-abdominal masses.  Positive bowel sounds noted in all quadrants.   Musculoskeletal: No joint deformity upper and lower extremities. Good ROM, no contractures. Normal muscle tone.  Neurologic: CN  2-12 grossly intact. Sensation intact, strength noted to be 5 out of 5 in all 4 extremities.  Patient is following all commands.  Patient is responsive to verbal stimuli.   Psychiatric: Patient presents as a normal mood with appropriate affect.  Patient seems to possess insight as to theircurrent situation.     Labs on Admission: I have personally reviewed following labs and imaging studies -   CBC: Recent Labs  Lab 07/26/20 1747  WBC 12.8*  NEUTROABS 11.4*  HGB 13.4  HCT 43.5  MCV 93.8  PLT 528   Basic Metabolic Panel: Recent Labs  Lab 07/26/20 1747 07/27/20 0115  NA 139 137  K 6.6* 7.2*  CL 110 111  CO2 13* 12*  GLUCOSE 113* 112*  BUN 115* 127*  CREATININE 12.06* 12.34*  CALCIUM 9.2 8.6*  MG  --  2.5*   GFR: CrCl cannot be calculated (Unknown ideal weight.). Liver Function Tests: Recent Labs  Lab 07/26/20 1747  AST 33   ALT 32  ALKPHOS 46  BILITOT 1.1  PROT 6.4*  ALBUMIN 3.2*   No results for input(s): LIPASE, AMYLASE in the last 168 hours. No results for input(s): AMMONIA in the last 168 hours. Coagulation Profile: No results for input(s): INR, PROTIME in the last 168 hours. Cardiac Enzymes: No results for input(s): CKTOTAL, CKMB, CKMBINDEX, TROPONINI in the last 168 hours. BNP (last 3 results) No results for input(s): PROBNP in the last 8760 hours. HbA1C: No results for input(s): HGBA1C in the last 72 hours. CBG: No results for input(s): GLUCAP in the last 168 hours. Lipid Profile: No results for input(s): CHOL, HDL, LDLCALC, TRIG, CHOLHDL, LDLDIRECT in the last 72 hours. Thyroid Function Tests: No results for input(s): TSH, T4TOTAL, FREET4, T3FREE, THYROIDAB in the last 72 hours. Anemia Panel: No results for input(s): VITAMINB12, FOLATE, FERRITIN, TIBC, IRON, RETICCTPCT in the last 72 hours. Urine analysis:    Component Value Date/Time   COLORURINE YELLOW 04/26/2020 0909   APPEARANCEUR CLEAR 04/26/2020 0909   LABSPEC 1.015 04/26/2020 0909   PHURINE 7.5 04/26/2020 0909   GLUCOSEU NEGATIVE 04/26/2020 0909   HGBUR NEGATIVE 04/26/2020 0909   BILIRUBINUR NEGATIVE 04/26/2020 0909   KETONESUR TRACE (A) 04/26/2020 0909   PROTEINUR 30 (A) 04/08/2020 1203   UROBILINOGEN 0.2 04/26/2020 0909   NITRITE NEGATIVE 04/26/2020 0909   LEUKOCYTESUR NEGATIVE 04/26/2020 0909    Radiological Exams on Admission - Personally Reviewed: DG Chest 1 View  Result Date: 07/26/2020 CLINICAL DATA:  79 year old female with shortness of breath. EXAM: CHEST  1 VIEW COMPARISON:  Chest radiograph dated 04/26/2020 FINDINGS: Small bilateral pleural effusions with bibasilar atelectasis, increased since the prior radiograph. Pneumonia is not excluded. No pneumothorax. The cardiac borders are silhouetted. Atherosclerotic calcification of the aorta. No acute osseous pathology. IMPRESSION: Small bilateral pleural effusions with  bibasilar atelectasis, increased since the prior radiograph. Electronically Signed   By: Anner Crete M.D.   On: 07/26/2020 20:24   US RENAL  Result Date: 07/27/2020 CLINICAL DATA:  Renal failure. EXAM: RENAL / URINARY TRACT ULTRASOUND COMPLETE COMPARISON:  CT 04/08/2020 FINDINGS: Right Kidney: Surgically absent. Left Kidney: Renal measurements: 9.6 x 5.4 x 4.8 cm = volume: 130 mL. Echogenicity within normal limits. No mass or hydronephrosis visualized. Bladder: Empty and not well assessed. Other: Incidental right pleural effusion. IMPRESSION: 1. Unremarkable sonographic appearance of the left kidney. 2. Right nephrectomy. 3. Urinary bladder is empty and not well assessed. 4. Right pleural effusion incidentally noted. Electronically Signed   By: Threasa Beards  Sanford M.D.   On: 07/27/2020 04:04    EKG: Personally reviewed.  Rhythm is normal sinus rhythm with heart rate of 86 bpm. Minimally peaked T waves.   Assessment/Plan Principal Problem:   Acute kidney injury (AKI) superimposed on chronic kidney disease stage IIIb with acute tubular necrosis (ATN) (HCC)   Sudden development of acute kidney injury   Patient has known history of chronic kidney disease, likely secondary to longstanding history of hypertension  It is unclear what caused this significant deterioration in renal function.  Patient denies NSAID use or illicit drug use.  Patient does report some diarrhea but this has only been ongoing for 24 hours.    Patient is likely suffering from superimposed acute tubular necrosis.  Patient is additionally suffering from severe metabolic acidosis and hyperkalemia as a result of severe kidney injury.  Appreciate recommendations by Dr. Augustin Coupe with nephrology. Patient could very likely need hemodialysis if she does not rapidly improve.  Renal ultrasound reveals no evidence of postobstructive etiology and no evidence of medical renal disease which is very reassuring.  Patient states that she is  still urinating and does not appear volume overloaded and therefore I will place patient on gentle intravenous hydration while monitoring closely for sudden onset of volume overload.  Strict input and output monitoring  Daily weights  Urine electrolytes, creatine kinase and rheumatologic work-up all ordered by nephrology.  Active Problems:  Metabolic acidosis   Patient exhibiting substantial metabolic acidosis based on chemistry  Etiology is advanced renal disease  Will abstain from initiation of sodium bicarbonate infusion as this can precipitate third spacing/volume overload in the situation.-will defer decision on management of metabolic acidosis to nephrology -if metabolic acidosis persists patient may benefit from starting bicarbonate tablets instead.    Hyperkalemia, diminished renal excretion  Patient presenting with severe hyperkalemia with minimal EKG changes  Patient already administered Lokelma, dextrose, insulin, bicarbonate and calcium by the emergency department staff.  Admitting patient to stepdown unit for close monitoring on telemetry.   Nephrology is aware and is additionally following  If electrolyte abnormalities do not rapidly improve patient will likely need dialysis.    Essential hypertension   Holding home regimen of Micardis and hydrochlorothiazide  Continuing remainder of home regimen of antihypertensive therapy.  We will additionally provide patient with as needed intravenous antihypertensives persistently elevated blood pressures.    Mixed hyperlipidemia    Continue home regimen of statin therapy.   Code Status:  DNR Family Communication: deferred   Status is: Inpatient  Remains inpatient appropriate because:Persistent severe electrolyte disturbances, Ongoing diagnostic testing needed not appropriate for outpatient work up, IV treatments appropriate due to intensity of illness or inability to take PO and Inpatient level of care  appropriate due to severity of illness   Dispo: The patient is from: Home              Anticipated d/c is to: Home              Anticipated d/c date is: > 3 days              Patient currently is not medically stable to d/c.        Vernelle Emerald MD [facility] Pager [phone]  If 7PM-7AM, please contact night-coverage [email or web address] Use universal [place] password for that web site. If you do not have the password, please call the hospital operator.  07/27/2020, 5:50 AM

## 2020-07-27 NOTE — ED Notes (Signed)
Patient having small amt diarrhea, cleaned patient and clean linens applied. Patient states she is comfortable, transported to u/s.

## 2020-07-27 NOTE — Progress Notes (Addendum)
[facility] Progress Note   79 y.o. female  HTN, HLD, HCV, COPD, rt nephrectomy in 2018 for RCC p/w  vomiting x1 day +  nausea for a few days. She has also had water diarrhea and noted increased swelling in her extremities. She has had swelling that occurred a few months ago that was treated with Lasix. In addition she has had progressively worsening dyspnea with exertion with a nonproductive cough and congestion recently started on doxycycline and antitussive. She denies fever, chills, CP, orthopnea, dizziness of loss of consciousness. She has not seen nephrology yet. Her creatinine in the past few months have been in the 1.3-1.6 range. She denies dysuria, hematuria, obstructive symptoms, rashes, new medications or NSAID use. She also denies hemoptysis or new joint discomfort. CXR showed b/l effusions left > right.  Assessment/ Plan:   1. Acute on CKD3 s/p rt nephrectomy BL Cr had been in the 1.3-1.6 range in recent months presumably chronic component secondary to HTN.  - Will check urine studies and a renal ultrasound. - Avoid contrast and other nephrotoxins if possible. - Renal dose medications for a GFR under 15 ml/min. - Sending serological workup as well. - STAT BMet this AM requested -> headed to RRT soon if there is no improvement. - Will check PT PTT in case line is required today -> prefer tunneled catheter if RRT necessary  - Strict I&O's, daily weights  - Will also dose Lasix x1   -> no improvement in renal function -> will initiate HD today while workup ongoing. Appreciate VIR (Dr. Kathlene Cote) who will place TC early afternoon.  2. Hyperkalemia - already received HCO3 and Calcium gluconate. - Also given D5 + insulin 6U at 0256 this AM - Lokelma 10 g BID  3. Dyspnea with h/o COPD + b/l pleural effusions 4. HTN 5. Remote h/o HCV  Subjective:   Breathing is comfortable. Diarrhea. Denies CP/n/v.   Objective:   BP (!) 187/61 (BP Location: Right Arm)   Pulse  97   Temp 98.2 F (36.8 C)   Resp 18   Ht 5\' 2"  (1.575 m)   Wt 74.3 kg   SpO2 99%   BMI 29.96 kg/m  No intake or output data in the 24 hours ending 07/27/20 0702 Weight change:   Physical Exam: GEN: NAD, A&Ox3, NCAT HEENT: No conjunctival pallor, EOMI NECK: Supple, no thyromegaly LUNGS: CTA B/L no rales, rhonchi or wheezing CV: RRR, No M/R/G ABD: SNDNT +BS  EXT: Tr  lower extremity edema    Imaging: DG Chest 1 View  Result Date: 07/26/2020 CLINICAL DATA:  79 year old female with shortness of breath. EXAM: CHEST  1 VIEW COMPARISON:  Chest radiograph dated 04/26/2020 FINDINGS: Small bilateral pleural effusions with bibasilar atelectasis, increased since the prior radiograph. Pneumonia is not excluded. No pneumothorax. The cardiac borders are silhouetted. Atherosclerotic calcification of the aorta. No acute osseous pathology. IMPRESSION: Small bilateral pleural effusions with bibasilar atelectasis, increased since the prior radiograph. Electronically Signed   By: Anner Crete M.D.   On: 07/26/2020 20:24   US RENAL  Result Date: 07/27/2020 CLINICAL DATA:  Renal failure. EXAM: RENAL / URINARY TRACT ULTRASOUND COMPLETE COMPARISON:  CT 04/08/2020 FINDINGS: Right Kidney: Surgically absent. Left Kidney: Renal measurements: 9.6 x 5.4 x 4.8 cm = volume: 130 mL. Echogenicity within normal limits. No mass or hydronephrosis visualized. Bladder: Empty and not well assessed. Other: Incidental right pleural effusion. IMPRESSION: 1. Unremarkable sonographic appearance of the left kidney. 2. Right nephrectomy. 3. Urinary  bladder is empty and not well assessed. 4. Right pleural effusion incidentally noted. Electronically Signed   By: Keith Rake M.D.   On: 07/27/2020 04:04    Labs: BMET Recent Labs  Lab 07/26/20 1747 07/27/20 0115  NA 139 137  K 6.6* 7.2*  CL 110 111  CO2 13* 12*  GLUCOSE 113* 112*  BUN 115* 127*  CREATININE 12.06* 12.34*  CALCIUM 9.2 8.6*   CBC Recent Labs   Lab 07/26/20 1747  WBC 12.8*  NEUTROABS 11.4*  HGB 13.4  HCT 43.5  MCV 93.8  PLT 242    Medications:    . amLODipine  10 mg Oral Daily  . heparin  5,000 Units Subcutaneous Q8H  . metoprolol succinate  100 mg Oral Daily  . multivitamin with minerals  1 tablet Oral Daily  . rosuvastatin  10 mg Oral Daily  . sodium zirconium cyclosilicate  10 g Oral BID      Otelia Santee, MD 07/27/2020, 7:02 AM

## 2020-07-27 NOTE — Procedures (Signed)
Interventional Radiology Procedure Note  Procedure: Right IJ tunneled HD catheter placement  Complications: None  Estimated Blood Loss: < 10 mL  Findings: 19 cm tip to cuff length Palindrome catheter placed via right IJ with tip in RA. OK to use.  Venetia Night. Kathlene Cote, M.D Pager:  [phone]

## 2020-07-27 NOTE — Consult Note (Signed)
Chief Complaint: Patient was seen in consultation today for AKI/tunneled HD catheter placement.  Referring Physician(s): Dwana Melena  Supervising Physician: Aletta Edouard  Patient Status: [facility] - In-pt  History of Present Illness: Cassandra Dyer is a 79 y.o. female with a past medical history of hypertension, hyperlipidemia, COPD, Hepatitis C, RCC s/p right nephrectomy 2018, and anemia. She presented to [facility] ED 07/26/2020 due to vomiting. In ED, labs consistent with AKI. She was admitted for further management. Nephrology was consulted who recommended initiation of HD along with IR consult for possible tunneled HD catheter placement.  IR requested by Dr. Augustin Coupe for possible image-guided tunneled HD catheter placement. Patient awake and alert laying in bed with no complaints at this time. Denies fever, chills, chest pain, dyspnea, abdominal pain, or headache.   Past Medical History:  Diagnosis Date  . Anemia   . Hepatitis    hx of hep C - 10-20 years ago   . History of blood transfusion   . History of kidney cancer   . Hyperlipidemia   . Hypertension    not taken b/p med in 2-3 years md deceased never went to another md     Past Surgical History:  Procedure Laterality Date  . CATARACT EXTRACTION Bilateral    11/30 left /12/9 right  . ROBOT ASSISTED LAPAROSCOPIC NEPHRECTOMY Right 01/13/2017   Procedure: XI ROBOTIC ASSISTED LAPAROSCOPIC RADICAL NEPHRECTOMY WITH LYSIS OF ADHESION;  Surgeon: Alexis Frock, MD;  Location: [facility] ORS;  Service: Urology;  Laterality: Right;    Allergies: Patient has no known allergies.  Medications: Prior to Admission medications   Medication Sig Start Date End Date Taking? Authorizing Provider  amLODipine (NORVASC) 10 MG tablet Take 1 tablet (10 mg total) by mouth daily. 04/19/20  Yes Biagio Borg, MD  Ascorbic Acid (VITAMIN C PO) Take 500 mg by mouth in the morning and at bedtime.    Yes [provider]  benzonatate (TESSALON) 200 MG  capsule Take 1 capsule (200 mg total) by mouth 3 (three) times daily as needed. 07/22/20  Yes Marrian Salvage, FNP  Black Currant Seed Oil 500 MG CAPS Take 500 mg by mouth daily.   Yes [provider]  Calcium Carbonate-Vit D-Min (CALCIUM 1200 PO) Take 1,200 mg by mouth daily.    Yes [provider]  Cholecalciferol (VITAMIN D PO) Take 2,000 mcg by mouth See admin instructions. Takes 2000 mcg 3 times a week ( monday, wednesday and friday)   Yes [provider]  doxycycline (VIBRA-TABS) 100 MG tablet Take 1 tablet (100 mg total) by mouth 2 (two) times daily. 07/22/20  Yes Marrian Salvage, FNP  Garlic 3570 MG CAPS Take 2,000 mg by mouth daily.    Yes [provider]  hydrochlorothiazide (HYDRODIURIL) 25 MG tablet Take 1 tablet (25 mg total) by mouth daily. 04/19/20  Yes Biagio Borg, MD  metoprolol succinate (TOPROL-XL) 100 MG 24 hr tablet Take 1 tablet (100 mg total) by mouth daily. Take with or immediately following a meal. 04/19/20  Yes Biagio Borg, MD  Multiple Vitamin (MULTIVITAMIN WITH MINERALS) TABS tablet Take 1 tablet by mouth daily.   Yes [provider]  Omega-3 Fatty Acids (FISH OIL PO) Take 1 tablet by mouth daily.   Yes [provider]  ondansetron (ZOFRAN) 4 MG tablet Take 1 tablet (4 mg total) by mouth every 8 (eight) hours as needed for nausea or vomiting. 04/19/20  Yes Biagio Borg, MD  OVER THE COUNTER MEDICATION Take 1 capsule by mouth daily. Mega juice   Yes [provider]  rosuvastatin (CRESTOR) 10 MG tablet Take 1 tablet by mouth once daily Patient taking differently: Take 10 mg by mouth daily.  07/22/20  Yes Biagio Borg, MD  telmisartan (MICARDIS) 80 MG tablet Take 1 tablet (80 mg total) by mouth daily. 04/26/20  Yes Biagio Borg, MD     Family History  Problem Relation Age of Onset  . Hypertension Mother   . Pancreatic cancer Father   . Breast cancer Sister     Social History   Socioeconomic  History  . Marital status: Significant Other    Spouse name: Not on file  . Number of children: 0  . Years of education: 9  . Highest education level: Not on file  Occupational History  . Occupation: Retired  Tobacco Use  . Smoking status: Former Smoker    Packs/day: 0.50    Years: 30.00    Pack years: 15.00    Types: Cigarettes    Quit date: 12/14/2000    Years since quitting: 19.6  . Smokeless tobacco: Never Used  Vaping Use  . Vaping Use: Never used  Substance and Sexual Activity  . Alcohol use: Yes    Comment: occasional wine   . Drug use: No  . Sexual activity: Not Currently  Other Topics Concern  . Not on file  Social History Narrative   Fun/Hobby: Watching TV and crossword puzzles   Social Determinants of Health   Financial Resource Strain:   . Difficulty of Paying Living Expenses:   Food Insecurity:   . Worried About Charity fundraiser in the Last Year:   . Arboriculturist in the Last Year:   Transportation Needs:   . Film/video editor (Medical):   Marland Kitchen Lack of Transportation (Non-Medical):   Physical Activity:   . Days of Exercise per Week:   . Minutes of Exercise per Session:   Stress:   . Feeling of Stress :   Social Connections:   . Frequency of Communication with Friends and Family:   . Frequency of Social Gatherings with Friends and Family:   . Attends Religious Services:   . Active Member of Clubs or Organizations:   . Attends Archivist Meetings:   Marland Kitchen Marital Status:      Review of Systems: A 12 point ROS discussed and pertinent positives are indicated in the HPI above.  All other systems are negative.  Review of Systems  Constitutional: Negative for chills and fever.  Respiratory: Negative for shortness of breath and wheezing.   Cardiovascular: Negative for chest pain and palpitations.  Gastrointestinal: Negative for abdominal pain.  Neurological: Negative for headaches.  Psychiatric/Behavioral: Negative for behavioral problems  and confusion.    Vital Signs: BP (!) 166/55 (BP Location: Right Arm)   Pulse 96   Temp 98 F (36.7 C) (Oral)   Resp 20   Ht 5\' 2"  (1.575 m)   Wt 163 lb 12.8 oz (74.3 kg)   SpO2 91%   BMI 29.96 kg/m   Physical Exam Vitals and nursing note reviewed.  Constitutional:      General: She is not in acute distress.    Appearance: Normal appearance.  Cardiovascular:     Rate and Rhythm: Normal rate and regular rhythm.     Heart sounds: Normal heart sounds. No murmur heard.   Pulmonary:     Effort: Pulmonary effort  is normal. No respiratory distress.     Breath sounds: Normal breath sounds. No wheezing.  Skin:    General: Skin is warm and dry.  Neurological:     Mental Status: She is alert and oriented to person, place, and time.      MD Evaluation Airway: WNL Heart: WNL Abdomen: WNL Chest/ Lungs: WNL ASA  Classification: 2 Mallampati/Airway Score: Two   Imaging: DG Chest 1 View  Result Date: 07/26/2020 CLINICAL DATA:  79 year old female with shortness of breath. EXAM: CHEST  1 VIEW COMPARISON:  Chest radiograph dated 04/26/2020 FINDINGS: Small bilateral pleural effusions with bibasilar atelectasis, increased since the prior radiograph. Pneumonia is not excluded. No pneumothorax. The cardiac borders are silhouetted. Atherosclerotic calcification of the aorta. No acute osseous pathology. IMPRESSION: Small bilateral pleural effusions with bibasilar atelectasis, increased since the prior radiograph. Electronically Signed   By: Anner Crete M.D.   On: 07/26/2020 20:24   US RENAL  Result Date: 07/27/2020 CLINICAL DATA:  Renal failure. EXAM: RENAL / URINARY TRACT ULTRASOUND COMPLETE COMPARISON:  CT 04/08/2020 FINDINGS: Right Kidney: Surgically absent. Left Kidney: Renal measurements: 9.6 x 5.4 x 4.8 cm = volume: 130 mL. Echogenicity within normal limits. No mass or hydronephrosis visualized. Bladder: Empty and not well assessed. Other: Incidental right pleural effusion.  IMPRESSION: 1. Unremarkable sonographic appearance of the left kidney. 2. Right nephrectomy. 3. Urinary bladder is empty and not well assessed. 4. Right pleural effusion incidentally noted. Electronically Signed   By: Keith Rake M.D.   On: 07/27/2020 04:04    Labs:  CBC: Recent Labs    04/11/20 0128 04/26/20 0909 07/26/20 1747 07/27/20 0734  WBC 12.7* 8.8 12.8* 13.2*  HGB 12.1 13.7 13.4 12.4  HCT 37.2 40.9 43.5 38.9  PLT 92* 352.0 242 218    COAGS: Recent Labs    04/09/20 1549 07/27/20 0734  INR 1.2 1.3*  APTT 34 31    BMP: Recent Labs    04/11/20 0128 04/11/20 0128 04/26/20 0909 07/26/20 1747 07/27/20 0115 07/27/20 0734  NA 138   < > 139 139 137 138  K 3.8   < > 4.3 6.6* 7.2* 5.6*  CL 112*   < > 101 110 111 110  CO2 20*   < > 34* 13* 12* 14*  GLUCOSE 94   < > 96 113* 112* 146*  BUN 16   < > 15 115* 127* 128*  CALCIUM 7.7*   < > 10.3 9.2 8.6* 8.4*  CREATININE 1.32*   < > 1.59* 12.06* 12.34* 12.33*  GFRNONAA 39*  --   --  3* 3* 3*  GFRAA 45*  --   --  3* 3* 3*   < > = values in this interval not displayed.    LIVER FUNCTION TESTS: Recent Labs    04/08/20 0635 04/08/20 0635 04/09/20 0308 04/26/20 0909 07/26/20 1747 07/27/20 0734  BILITOT 1.1  --  1.3* 0.3 1.1  --   AST 155*  --  102* 41* 33  --   ALT 95*  --  75* 46* 32  --   ALKPHOS 41  --  33* 63 46  --   PROT 4.9*  --  4.4* 6.1 6.4*  --   ALBUMIN 2.6*   < > 2.3* 3.3* 3.2* 2.5*   < > = values in this interval not displayed.     Assessment and Plan:  AKI on CKD stage III with tentative plans to begin HD. Plan for image-guided tunneled  HD catheter placement today in IR. Patient is NPO. Afebrile.  Risks and benefits discussed with the patient including, but not limited to bleeding, infection, vascular injury, pneumothorax which may require chest tube placement, air embolism or even death. All of the patient's questions were answered, patient is agreeable to proceed. Consent signed and in  chart.   Thank you for this interesting consult.  I greatly enjoyed meeting Cassandra Dyer and look forward to participating in their care.  A copy of this report was sent to the requesting provider on this date.  Electronically Signed: Earley Abide, PA-C 07/27/2020, 9:33 AM   I spent a total of 20 Minutes in face to face in clinical consultation, greater than 50% of which was counseling/coordinating care for AKI/tunneled HD catheter placement.

## 2020-07-27 NOTE — Consult Note (Signed)
Reason for Consult: Renal failure Referring Physician:  Dr. Davonna Belling  Chief Complaint: Vomiting  Assessment/Plan: 1. Acute on CKD3 s/p rt nephrectomy BL Cr had been in the 1.3-1.6 range in recent months presumably chronic component secondary to HTN.  - Will check urine studies and a renal ultrasound. - Avoid contrast and other nephrotoxins if possible. - Renal dose medications for a GFR under 15 ml/min. - Will send serological workup as well. - Headed to RRT soon if there is no improvement. 2. Hyperkalemia - already received HCO3 and Calcium gluconate. - D5 + insulin - Lokelma 10 g BID 3. Dyspnea with h/o COPD + b/l pleural effusions 4. HTN 5. Remote h/o HCV   HPI: Cassandra Dyer is an 79 y.o. female with a history of HTN, HLD, HCV, COPD, rt nephrectomy in 2018 for RCC presenting with vomiting described as nonbloody and nonbilious starting 24hrs ago. However, she has had nausea for a few days for which she has been taking Zofran. She has also had water diarrhea and noted increased swelling in her extremities. She has had swelling that occurred a few months ago that was treated with Lasix. In addition she has had progressively worsening dyspnea with exertion with a nonproductive cough and congestion recently started on doxycycline and antitussive. She denies fever, chills, CP, orthopnea, dizziness of loss of consciousness. She has not seen nephrology yet. Her creatinine in the past few months have been in the 1.3-1.6 range. She denies dysuria, hematuria, obstructive symptoms, rashes, new medications or NSAID use. She also denies hemoptysis or new joint discomfort. CXR showed b/l effusions left > right.  ROS Pertinent items are noted in HPI.  Chemistry and CBC: Creatinine, Ser  Date/Time Value Ref Range Status  07/26/2020 05:47 PM 12.06 (H) 0.44 - 1.00 mg/dL Final  04/26/2020 09:09 AM 1.59 (H) 0.40 - 1.20 mg/dL Final  04/11/2020 01:28 AM 1.32 (H) 0.44 - 1.00 mg/dL Final   04/10/2020 02:34 PM 1.50 (H) 0.44 - 1.00 mg/dL Final  04/09/2020 03:08 AM 2.15 (H) 0.44 - 1.00 mg/dL Final    Comment:    DELTA CHECK NOTED  04/08/2020 06:35 AM 3.17 (H) 0.44 - 1.00 mg/dL Final  06/29/2019 04:22 PM 1.17 0.40 - 1.20 mg/dL Final  03/10/2019 08:01 AM 1.05 0.40 - 1.20 mg/dL Final  02/23/2019 04:32 PM 1.10 0.40 - 1.20 mg/dL Final  01/20/2018 04:43 PM 1.13 0.40 - 1.20 mg/dL Final  03/08/2017 10:11 AM 0.96 0.40 - 1.20 mg/dL Final  01/14/2017 05:44 AM 1.03 (H) 0.44 - 1.00 mg/dL Final  01/12/2017 02:19 PM 0.79 0.44 - 1.00 mg/dL Final  06/13/2016 06:33 PM 0.98 0.44 - 1.00 mg/dL Final  08/03/2009 02:40 AM 0.59 0.40 - 1.20 mg/dL Final  04/03/2009 07:38 AM 0.77 0.40 - 1.20 mg/dL Final   Recent Labs  Lab 07/26/20 1747  NA 139  K 6.6*  CL 110  CO2 13*  GLUCOSE 113*  BUN 115*  CREATININE 12.06*  CALCIUM 9.2   Recent Labs  Lab 07/26/20 1747  WBC 12.8*  NEUTROABS 11.4*  HGB 13.4  HCT 43.5  MCV 93.8  PLT 242   Liver Function Tests: Recent Labs  Lab 07/26/20 1747  AST 33  ALT 32  ALKPHOS 46  BILITOT 1.1  PROT 6.4*  ALBUMIN 3.2*   No results for input(s): LIPASE, AMYLASE in the last 168 hours. No results for input(s): AMMONIA in the last 168 hours. Cardiac Enzymes: No results for input(s): CKTOTAL, CKMB, CKMBINDEX, TROPONINI in the last 168  hours. Iron Studies: No results for input(s): IRON, TIBC, TRANSFERRIN, FERRITIN in the last 72 hours. PT/INR: @LABRCNTIP (inr:5)  Xrays/Other Studies: ) Results for orders placed or performed during the hospital encounter of 07/26/20 (from the past 48 hour(s))  SARS Coronavirus 2 by RT PCR (hospital order, performed in [facility] hospital lab) Nasopharyngeal Nasopharyngeal Swab     Status: None   Collection Time: 07/26/20  5:31 PM   Specimen: Nasopharyngeal Swab  Result Value Ref Range   SARS Coronavirus 2 NEGATIVE NEGATIVE    Comment: (NOTE) SARS-CoV-2 target nucleic acids are NOT DETECTED.  The SARS-CoV-2 RNA is  generally detectable in upper and lower respiratory specimens during the acute phase of infection. The lowest concentration of SARS-CoV-2 viral copies this assay can detect is 250 copies / mL. A negative result does not preclude SARS-CoV-2 infection and should not be used as the sole basis for treatment or other patient management decisions.  A negative result may occur with improper specimen collection / handling, submission of specimen other than nasopharyngeal swab, presence of viral mutation(s) within the areas targeted by this assay, and inadequate number of viral copies (<250 copies / mL). A negative result must be combined with clinical observations, patient history, and epidemiological information.  Fact Sheet for Patients:   StrictlyIdeas.no  Fact Sheet for Healthcare Providers: BankingDealers.co.za  This test is not yet approved or  cleared by the [place] FDA and has been authorized for detection and/or diagnosis of SARS-CoV-2 by FDA under an Emergency Use Authorization (EUA).  This EUA will remain in effect (meaning this test can be used) for the duration of the COVID-19 declaration under Section 564(b)(1) of the Act, 21 U.S.C. section 360bbb-3(b)(1), unless the authorization is terminated or revoked sooner.  Performed at [facility], [place] [address] [place], [place] [address]   CBC with Differential     Status: Abnormal   Collection Time: 07/26/20  5:47 PM  Result Value Ref Range   WBC 12.8 (H) 4.0 - 10.5 K/uL   RBC 4.64 3.87 - 5.11 MIL/uL   Hemoglobin 13.4 12.0 - 15.0 g/dL   HCT 43.5 36 - 46 %   MCV 93.8 80.0 - 100.0 fL   MCH 28.9 26.0 - 34.0 pg   MCHC 30.8 30.0 - 36.0 g/dL   RDW 17.2 (H) 11.5 - 15.5 %   Platelets 242 150 - 400 K/uL   nRBC 0.2 0.0 - 0.2 %   Neutrophils Relative % 88 %   Neutro Abs 11.4 (H) 1.7 - 7.7 K/uL   Lymphocytes Relative 5 %   Lymphs Abs 0.6 (L) 0.7 - 4.0 K/uL   Monocytes  Relative 6 %   Monocytes Absolute 0.7 0 - 1 K/uL   Eosinophils Relative 0 %   Eosinophils Absolute 0.0 0 - 0 K/uL   Basophils Relative 0 %   Basophils Absolute 0.0 0 - 0 K/uL   Immature Granulocytes 1 %   Abs Immature Granulocytes 0.10 (H) 0.00 - 0.07 K/uL    Comment: Performed at [facility] [address], [place], [place] [address]  Comprehensive metabolic panel     Status: Abnormal   Collection Time: 07/26/20  5:47 PM  Result Value Ref Range   Sodium 139 135 - 145 mmol/L   Potassium 6.6 (HH) 3.5 - 5.1 mmol/L    Comment: NO VISIBLE HEMOLYSIS CRITICAL RESULT CALLED TO, READ BACK BY AND VERIFIED WITH: Denman George RN [phone] [address]  Chloride 110 98 - 111 mmol/L   CO2 13 (L) 22 - 32 mmol/L   Glucose, Bld 113 (H) 70 - 99 mg/dL    Comment: Glucose reference range applies only to samples taken after fasting for at least 8 hours.   BUN 115 (H) 8 - 23 mg/dL   Creatinine, Ser 12.06 (H) 0.44 - 1.00 mg/dL   Calcium 9.2 8.9 - 10.3 mg/dL   Total Protein 6.4 (L) 6.5 - 8.1 g/dL   Albumin 3.2 (L) 3.5 - 5.0 g/dL   AST 33 15 - 41 U/L   ALT 32 0 - 44 U/L   Alkaline Phosphatase 46 38 - 126 U/L   Total Bilirubin 1.1 0.3 - 1.2 mg/dL   GFR calc non Af Amer 3 (L) >60 mL/min   GFR calc Af Amer 3 (L) >60 mL/min   Anion gap 16 (H) 5 - 15    Comment: Performed at [facility] [address], [place], [place] [address]   DG Chest 1 View  Result Date: 07/26/2020 CLINICAL DATA:  79 year old female with shortness of breath. EXAM: CHEST  1 VIEW COMPARISON:  Chest radiograph dated 04/26/2020 FINDINGS: Small bilateral pleural effusions with bibasilar atelectasis, increased since the prior radiograph. Pneumonia is not excluded. No pneumothorax. The cardiac borders are silhouetted. Atherosclerotic calcification of the aorta. No acute osseous pathology. IMPRESSION: Small bilateral pleural effusions with bibasilar atelectasis, increased since the prior radiograph. Electronically Signed   By:  Anner Crete M.D.   On: 07/26/2020 20:24    PMH:   Past Medical History:  Diagnosis Date  . Anemia   . Hepatitis    hx of hep C - 10-20 years ago   . History of blood transfusion   . History of kidney cancer   . Hyperlipidemia   . Hypertension    not taken b/p med in 2-3 years md deceased never went to another md     PSH:   Past Surgical History:  Procedure Laterality Date  . CATARACT EXTRACTION Bilateral    11/30 left /12/9 right  . ROBOT ASSISTED LAPAROSCOPIC NEPHRECTOMY Right 01/13/2017   Procedure: XI ROBOTIC ASSISTED LAPAROSCOPIC RADICAL NEPHRECTOMY WITH LYSIS OF ADHESION;  Surgeon: Alexis Frock, MD;  Location: [facility];  Service: Urology;  Laterality: Right;    Allergies: No Known Allergies  Medications:   Prior to Admission medications   Medication Sig Start Date End Date Taking? Authorizing Provider  amLODipine (NORVASC) 10 MG tablet Take 1 tablet (10 mg total) by mouth daily. 04/19/20  Yes Biagio Borg, MD  Ascorbic Acid (VITAMIN C PO) Take 500 mg by mouth in the morning and at bedtime.    Yes [provider]  benzonatate (TESSALON) 200 MG capsule Take 1 capsule (200 mg total) by mouth 3 (three) times daily as needed. 07/22/20  Yes Marrian Salvage, FNP  Black Currant Seed Oil 500 MG CAPS Take 500 mg by mouth daily.   Yes [provider]  Calcium Carbonate-Vit D-Min (CALCIUM 1200 PO) Take 1,200 mg by mouth daily.    Yes [provider]  Cholecalciferol (VITAMIN D PO) Take 2,000 mcg by mouth See admin instructions. Takes 2000 mcg 3 times a week ( monday, wednesday and friday)   Yes [provider]  doxycycline (VIBRA-TABS) 100 MG tablet Take 1 tablet (100 mg total) by mouth 2 (two) times daily. 07/22/20  Yes Marrian Salvage, FNP  Garlic 0981 MG CAPS Take 2,000 mg by mouth daily.  Yes [provider]  hydrochlorothiazide (HYDRODIURIL) 25 MG tablet Take 1 tablet (25 mg total) by mouth daily. 04/19/20  Yes Biagio Borg, MD  metoprolol succinate (TOPROL-XL) 100 MG 24 hr tablet Take 1 tablet (100 mg total) by mouth daily. Take with or immediately following a meal. 04/19/20  Yes Biagio Borg, MD  Multiple Vitamin (MULTIVITAMIN WITH MINERALS) TABS tablet Take 1 tablet by mouth daily.   Yes [provider]  Omega-3 Fatty Acids (FISH OIL PO) Take 1 tablet by mouth daily.   Yes [provider]  ondansetron (ZOFRAN) 4 MG tablet Take 1 tablet (4 mg total) by mouth every 8 (eight) hours as needed for nausea or vomiting. 04/19/20  Yes Biagio Borg, MD  OVER THE COUNTER MEDICATION Take 1 capsule by mouth daily. Mega juice   Yes [provider]  rosuvastatin (CRESTOR) 10 MG tablet Take 1 tablet by mouth once daily Patient taking differently: Take 10 mg by mouth daily.  07/22/20  Yes Biagio Borg, MD  telmisartan (MICARDIS) 80 MG tablet Take 1 tablet (80 mg total) by mouth daily. 04/26/20  Yes Biagio Borg, MD    Discontinued Meds:  There are no discontinued medications.  Social History:  reports that she quit smoking about 19 years ago. Her smoking use included cigarettes. She has a 15.00 pack-year smoking history. She has never used smokeless tobacco. She reports current alcohol use. She reports that she does not use drugs.  Family History:   Family History  Problem Relation Age of Onset  . Hypertension Mother   . Pancreatic cancer Father   . Breast cancer Sister     Blood pressure (!) 185/74, pulse 87, temperature 97.9 F (36.6 C), temperature source Oral, resp. rate 19, SpO2 100 %. General appearance: alert, cooperative and appears stated age Head: Normocephalic, without obvious abnormality, atraumatic Eyes: negative Neck: JVD - 6 cm above sternal notch, no adenopathy, no carotid bruit, supple, symmetrical, trachea midline and thyroid not enlarged, symmetric, no tenderness/mass/nodules Back: symmetric, no curvature. ROM normal. No CVA tenderness. Resp: clear to auscultation  bilaterally Chest wall: no tenderness Cardio: regular rate and rhythm GI: soft, non-tender; bowel sounds normal; no masses,  no organomegaly Extremities: edema tr Pulses: 2+ and symmetric Skin: Skin color, texture, turgor normal. No rashes or lesions       Aairah Negrette, Hunt Oris, MD 07/27/2020, 1:43 AM

## 2020-07-28 LAB — PROTEIN / CREATININE RATIO, URINE
Creatinine, Urine: 317.27 mg/dL
Protein Creatinine Ratio: 0.14 mg/mg{Cre} (ref 0.00–0.15)
Total Protein, Urine: 44 mg/dL

## 2020-07-28 LAB — CBC WITH DIFFERENTIAL/PLATELET
Abs Immature Granulocytes: 0.05 10*3/uL (ref 0.00–0.07)
Basophils Absolute: 0 10*3/uL (ref 0.0–0.1)
Basophils Relative: 0 %
Eosinophils Absolute: 0.1 10*3/uL (ref 0.0–0.5)
Eosinophils Relative: 1 %
HCT: 32.4 % — ABNORMAL LOW (ref 36.0–46.0)
Hemoglobin: 10.7 g/dL — ABNORMAL LOW (ref 12.0–15.0)
Immature Granulocytes: 1 %
Lymphocytes Relative: 9 %
Lymphs Abs: 0.9 10*3/uL (ref 0.7–4.0)
MCH: 29.9 pg (ref 26.0–34.0)
MCHC: 33 g/dL (ref 30.0–36.0)
MCV: 90.5 fL (ref 80.0–100.0)
Monocytes Absolute: 1.4 10*3/uL — ABNORMAL HIGH (ref 0.1–1.0)
Monocytes Relative: 14 %
Neutro Abs: 7.8 10*3/uL — ABNORMAL HIGH (ref 1.7–7.7)
Neutrophils Relative %: 75 %
Platelets: 171 10*3/uL (ref 150–400)
RBC: 3.58 MIL/uL — ABNORMAL LOW (ref 3.87–5.11)
RDW: 16.9 % — ABNORMAL HIGH (ref 11.5–15.5)
WBC: 10.1 10*3/uL (ref 4.0–10.5)
nRBC: 0 % (ref 0.0–0.2)

## 2020-07-28 LAB — GASTROINTESTINAL PANEL BY PCR, STOOL (REPLACES STOOL CULTURE)

## 2020-07-28 LAB — URINALYSIS, ROUTINE W REFLEX MICROSCOPIC
Bilirubin Urine: NEGATIVE
Glucose, UA: NEGATIVE mg/dL
Hgb urine dipstick: NEGATIVE
Ketones, ur: NEGATIVE mg/dL
Nitrite: NEGATIVE
Protein, ur: NEGATIVE mg/dL
Renal Epithelial: <1
Specific Gravity, Urine: 1.016 (ref 1.005–1.030)
pH: 5 (ref 5.0–8.0)

## 2020-07-28 LAB — CREATININE, URINE, RANDOM: Creatinine, Urine: 316.4 mg/dL

## 2020-07-28 LAB — COMPREHENSIVE METABOLIC PANEL
ALT: 20 U/L (ref 0–44)
AST: 26 U/L (ref 15–41)
Albumin: 2.2 g/dL — ABNORMAL LOW (ref 3.5–5.0)
Alkaline Phosphatase: 32 U/L — ABNORMAL LOW (ref 38–126)
Anion gap: 16 — ABNORMAL HIGH (ref 5–15)
BUN: 129 mg/dL — ABNORMAL HIGH (ref 8–23)
CO2: 16 mmol/L — ABNORMAL LOW (ref 22–32)
Calcium: 8.1 mg/dL — ABNORMAL LOW (ref 8.9–10.3)
Chloride: 106 mmol/L (ref 98–111)
Creatinine, Ser: 12.35 mg/dL — ABNORMAL HIGH (ref 0.44–1.00)
GFR calc Af Amer: 3 mL/min — ABNORMAL LOW (ref >60)
GFR calc non Af Amer: 3 mL/min — ABNORMAL LOW (ref >60)
Glucose, Bld: 103 mg/dL — ABNORMAL HIGH (ref 70–99)
Potassium: 4.7 mmol/L (ref 3.5–5.1)
Sodium: 138 mmol/L (ref 135–145)
Total Bilirubin: 0.6 mg/dL (ref 0.3–1.2)
Total Protein: 4.2 g/dL — ABNORMAL LOW (ref 6.5–8.1)

## 2020-07-28 LAB — SODIUM, URINE, RANDOM: Sodium, Ur: 22 mmol/L

## 2020-07-28 LAB — PHOSPHORUS: Phosphorus: 8.1 mg/dL — ABNORMAL HIGH (ref 2.5–4.6)

## 2020-07-28 LAB — C4 COMPLEMENT: Complement C4, Body Fluid: 25 mg/dL (ref 12–38)

## 2020-07-28 LAB — C3 COMPLEMENT: C3 Complement: 65 mg/dL — ABNORMAL LOW (ref 82–167)

## 2020-07-28 LAB — MAGNESIUM: Magnesium: 2.4 mg/dL (ref 1.7–2.4)

## 2020-07-28 MED ORDER — ALTEPLASE 2 MG IJ SOLR
2.0000 mg | Freq: Once | INTRAMUSCULAR | Status: DC | PRN
  Filled 2020-07-28: qty 2

## 2020-07-28 MED ORDER — HEPARIN SODIUM (PORCINE) 1000 UNIT/ML IJ SOLN
INTRAMUSCULAR | Status: AC
  Filled 2020-07-28: qty 4

## 2020-07-28 MED ORDER — HEPARIN SODIUM (PORCINE) 1000 UNIT/ML DIALYSIS
1000.0000 [IU] | INTRAMUSCULAR | Status: DC | PRN
  Administered 2020-07-29: 1000 [IU] via INTRAVENOUS_CENTRAL
  Filled 2020-07-28 (×2): qty 1

## 2020-07-28 MED ORDER — SODIUM CHLORIDE 0.9 % IV SOLN: 100.0000 mL | INTRAVENOUS | Status: DC | PRN

## 2020-07-28 MED ORDER — LIDOCAINE-PRILOCAINE 2.5-2.5 % EX CREA
1.0000 "application " | TOPICAL_CREAM | CUTANEOUS | Status: DC | PRN
  Filled 2020-07-28: qty 5

## 2020-07-28 MED ORDER — LIDOCAINE HCL (PF) 1 % IJ SOLN: 5.0000 mL | INTRAMUSCULAR | Status: DC | PRN

## 2020-07-28 MED ORDER — PENTAFLUOROPROP-TETRAFLUOROETH EX AERO
1.0000 "application " | INHALATION_SPRAY | CUTANEOUS | Status: DC | PRN
  Filled 2020-07-28: qty 116

## 2020-07-28 NOTE — Progress Notes (Signed)
[facility] Progress Note   79 y.o.female HTN, HLD, HCV, COPD, rt nephrectomy in 2018 for RCC p/w  vomiting x1 day +  nausea for a few days. She has also had water diarrhea and noted increased swelling in her extremities. She has had swelling that occurred a few months ago that was treated with Lasix. In addition she has had progressively worsening dyspnea with exertion with a nonproductive cough and congestion recently started on doxycycline and antitussive. She denies fever, chills, CP, orthopnea, dizziness of loss of consciousness. She has not seen nephrology yet. Her creatinine in the past few months have been in the 1.3-1.6 range.She denies dysuria, hematuria, obstructive symptoms, rashes, new medications or NSAID use. She also denies hemoptysis or new joint discomfort. CXR showed b/l effusions left > right.  Assessment/ Plan:   1. Acute on CKD3 s/p rt nephrectomy BL Cr had been in the 1.3-1.6 range in recent months presumably chronic component secondary to HTN.  - Will check urine studies; renal ultrasound -> no e/o obstruction. - Avoid contrast and other nephrotoxins if possible. - Renal dose medications for a GFR under 15 ml/min. - Sending serological workup as well + gave a trial of Lasix (poor response) - Strict I&O's, daily weights   -> no improvement in renal function + K 7.2  ->  initiated HD 8/14  while workup ongoing. Appreciate VIR (Dr. Kathlene Cote) who will place [facility] 8/14   - pt tolerated HD #1 very early this AM (overnight) UF net 995 mL  - Plan HD #2 for tomorrow; unfortunately not a candidate for a biopsy because of the solitary kidney. Will f/u serologies and urine studies.  2. Hyperkalemia - already received HCO3 and Calcium gluconate. - Also given D5 + insulin 6U at 0256 8/14  - Lokelma 10 g BID -> can stop  3. Anemia  - Will check iron studies and does ESA if needed.  4. Dyspnea with h/o COPD + b/l pleural effusions 5. HTN 6. Remote h/o  HCV  Subjective:   States her breathing is more comfortable.  Denies CP/n/v.   Objective:   BP (!) 167/70 (BP Location: Right Arm)   Pulse 84   Temp 98.3 F (36.8 C) (Oral)   Resp 16   Ht 5\' 2"  (1.575 m)   Wt 74.3 kg   SpO2 100%   BMI 29.96 kg/m   Intake/Output Summary (Last 24 hours) at 07/28/2020 1009 Last data filed at 07/28/2020 0900 Gross per 24 hour  Intake 1658.37 ml  Output 995 ml  Net 663.37 ml   Weight change:   Physical Exam: GEN: NAD, A&Ox3, NCAT HEENT: No conjunctival pallor, EOMI NECK: Supple, no thyromegaly LUNGS: CTA B/L no rales, rhonchi or wheezing CV: RRR, No M/R/G ABD: SNDNT +BS  EXT: Tr  lower extremity edema  Imaging: DG Chest 1 View  Result Date: 07/26/2020 CLINICAL DATA:  79 year old female with shortness of breath. EXAM: CHEST  1 VIEW COMPARISON:  Chest radiograph dated 04/26/2020 FINDINGS: Small bilateral pleural effusions with bibasilar atelectasis, increased since the prior radiograph. Pneumonia is not excluded. No pneumothorax. The cardiac borders are silhouetted. Atherosclerotic calcification of the aorta. No acute osseous pathology. IMPRESSION: Small bilateral pleural effusions with bibasilar atelectasis, increased since the prior radiograph. Electronically Signed   By: Anner Crete M.D.   On: 07/26/2020 20:24   US RENAL  Result Date: 07/27/2020 CLINICAL DATA:  Renal failure. EXAM: RENAL / URINARY TRACT ULTRASOUND COMPLETE COMPARISON:  CT 04/08/2020 FINDINGS: Right Kidney:  Surgically absent. Left Kidney: Renal measurements: 9.6 x 5.4 x 4.8 cm = volume: 130 mL. Echogenicity within normal limits. No mass or hydronephrosis visualized. Bladder: Empty and not well assessed. Other: Incidental right pleural effusion. IMPRESSION: 1. Unremarkable sonographic appearance of the left kidney. 2. Right nephrectomy. 3. Urinary bladder is empty and not well assessed. 4. Right pleural effusion incidentally noted. Electronically Signed   By: Keith Rake  M.D.   On: 07/27/2020 04:04   IR Fluoro Guide CV Line Right  Result Date: 07/27/2020 CLINICAL DATA:  Acute kidney injury, uremia and need for emergent hemodialysis. Request has been made to place a tunneled hemodialysis catheter due to predicted need for longer term hemodialysis. EXAM: TUNNELED CENTRAL VENOUS HEMODIALYSIS CATHETER PLACEMENT WITH ULTRASOUND AND FLUOROSCOPIC GUIDANCE ANESTHESIA/SEDATION: 2.0 mg IV Versed; 50 mcg IV Fentanyl. Total Moderate Sedation Time:   20 minutes. The patient's level of consciousness and physiologic status were continuously monitored during the procedure by Radiology nursing. MEDICATIONS: 2 g IV Ancef. FLUOROSCOPY TIME:  24 seconds.  2.0 mGy. PROCEDURE: The procedure, risks, benefits, and alternatives were explained to the patient. Questions regarding the procedure were encouraged and answered. The patient understands and consents to the procedure. A timeout was performed prior to initiating the procedure. The right neck and chest were prepped with chlorhexidine in a sterile fashion, and a sterile drape was applied covering the operative field. Maximum barrier sterile technique with sterile gowns and gloves were used for the procedure. Local anesthesia was provided with 1% lidocaine. Ultrasound was used to confirm patency of the right internal jugular vein. After creating a small venotomy incision, a 21 gauge needle was advanced into the right internal jugular vein under direct, real-time ultrasound guidance. Ultrasound image documentation was performed. After securing guidewire access, an 8 Fr dilator was placed. A J-wire was kinked to measure appropriate catheter length. A Palindrome tunneled hemodialysis catheter measuring 19 cm from tip to cuff was chosen for placement. This was tunneled in a retrograde fashion from the chest wall to the venotomy incision. At the venotomy, serial dilatation was performed and a 15 Fr peel-away sheath was placed over a guidewire. The  catheter was then placed through the sheath and the sheath removed. Final catheter positioning was confirmed and documented with a fluoroscopic spot image. The catheter was aspirated, flushed with saline, and injected with appropriate volume heparin dwells. The venotomy incision was closed with subcuticular 4-0 Vicryl. Dermabond was applied to the incision. The catheter exit site was secured with 0-Prolene retention sutures. COMPLICATIONS: None.  No pneumothorax. FINDINGS: After catheter placement, the tip lies in the right atrium. The catheter aspirates normally and is ready for immediate use. IMPRESSION: Placement of tunneled hemodialysis catheter via the right internal jugular vein. The catheter tip lies in the right atrium. The catheter is ready for immediate use. Electronically Signed   By: Aletta Edouard M.D.   On: 07/27/2020 15:00   IR US Guide Vasc Access Right  Result Date: 07/27/2020 CLINICAL DATA:  Acute kidney injury, uremia and need for emergent hemodialysis. Request has been made to place a tunneled hemodialysis catheter due to predicted need for longer term hemodialysis. EXAM: TUNNELED CENTRAL VENOUS HEMODIALYSIS CATHETER PLACEMENT WITH ULTRASOUND AND FLUOROSCOPIC GUIDANCE ANESTHESIA/SEDATION: 2.0 mg IV Versed; 50 mcg IV Fentanyl. Total Moderate Sedation Time:   20 minutes. The patient's level of consciousness and physiologic status were continuously monitored during the procedure by Radiology nursing. MEDICATIONS: 2 g IV Ancef. FLUOROSCOPY TIME:  24 seconds.  2.0 mGy.  PROCEDURE: The procedure, risks, benefits, and alternatives were explained to the patient. Questions regarding the procedure were encouraged and answered. The patient understands and consents to the procedure. A timeout was performed prior to initiating the procedure. The right neck and chest were prepped with chlorhexidine in a sterile fashion, and a sterile drape was applied covering the operative field. Maximum barrier sterile  technique with sterile gowns and gloves were used for the procedure. Local anesthesia was provided with 1% lidocaine. Ultrasound was used to confirm patency of the right internal jugular vein. After creating a small venotomy incision, a 21 gauge needle was advanced into the right internal jugular vein under direct, real-time ultrasound guidance. Ultrasound image documentation was performed. After securing guidewire access, an 8 Fr dilator was placed. A J-wire was kinked to measure appropriate catheter length. A Palindrome tunneled hemodialysis catheter measuring 19 cm from tip to cuff was chosen for placement. This was tunneled in a retrograde fashion from the chest wall to the venotomy incision. At the venotomy, serial dilatation was performed and a 15 Fr peel-away sheath was placed over a guidewire. The catheter was then placed through the sheath and the sheath removed. Final catheter positioning was confirmed and documented with a fluoroscopic spot image. The catheter was aspirated, flushed with saline, and injected with appropriate volume heparin dwells. The venotomy incision was closed with subcuticular 4-0 Vicryl. Dermabond was applied to the incision. The catheter exit site was secured with 0-Prolene retention sutures. COMPLICATIONS: None.  No pneumothorax. FINDINGS: After catheter placement, the tip lies in the right atrium. The catheter aspirates normally and is ready for immediate use. IMPRESSION: Placement of tunneled hemodialysis catheter via the right internal jugular vein. The catheter tip lies in the right atrium. The catheter is ready for immediate use. Electronically Signed   By: Aletta Edouard M.D.   On: 07/27/2020 15:00    Labs: BMET Recent Labs  Lab 07/26/20 1747 07/27/20 0115 07/27/20 0734 07/28/20 0246 07/28/20 0355  NA 139 137 138 138  --   K 6.6* 7.2* 5.6* 4.7  --   CL 110 111 110 106  --   CO2 13* 12* 14* 16*  --   GLUCOSE 113* 112* 146* 103*  --   BUN 115* 127* 128* 129*   --   CREATININE 12.06* 12.34* 12.33* 12.35*  --   CALCIUM 9.2 8.6* 8.4* 8.1*  --   PHOS  --   --  7.4*  --  8.1*   CBC Recent Labs  Lab 07/26/20 1747 07/27/20 0734 07/28/20 0246  WBC 12.8* 13.2* 10.1  NEUTROABS 11.4* 11.9* 7.8*  HGB 13.4 12.4 10.7*  HCT 43.5 38.9 32.4*  MCV 93.8 91.3 90.5  PLT 242 218 171    Medications:    . amLODipine  10 mg Oral Daily  . Chlorhexidine Gluconate Cloth  6 each Topical Q0600  . heparin  5,000 Units Subcutaneous Q8H  . heparin sodium (porcine)      . metoprolol succinate  100 mg Oral Daily  . multivitamin with minerals  1 tablet Oral Daily  . rosuvastatin  10 mg Oral Daily  . sodium zirconium cyclosilicate  10 g Oral BID      Otelia Santee, MD 07/28/2020, 10:09 AM

## 2020-07-28 NOTE — Progress Notes (Signed)
   07/28/20 0628  Hand-Off documentation  Handoff Given Given to shift RN/LPN  Report given to (Full Name) Toma Aran, RN  Handoff Received Received from shift RN/LPN  Report received from (Full Name) Henorckl Wandy Bossler  Vital Signs  Temp 98.7 F (37.1 C)  Temp Source Oral  Pulse Rate 80  Pulse Rate Source Monitor  Resp 19  BP (!) 168/79  BP Location Right Arm  BP Method Automatic  Patient Position (if appropriate) Lying  Oxygen Therapy  SpO2 100 %  O2 Device Nasal Cannula  Pain Assessment  Pain Scale 0-10  Pain Score 0  Post-Hemodialysis Assessment  Rinseback Volume (mL) 250 mL  KECN 194 V  Dialyzer Clearance Lightly streaked  Duration of HD Treatment -hour(s) 3 hour(s)  Hemodialysis Intake (mL) 700 mL  UF Total -Machine (mL) 1695 mL  Net UF (mL) 995 mL  Tolerated HD Treatment Yes  Post-Hemodialysis Comments tx  AVG/AVF Arterial Site Held (minutes) 0 minutes  AVG/AVF Venous Site Held (minutes) 0 minutes  Hemodialysis Catheter Right Internal jugular Double lumen Permanent (Tunneled)  Placement Date/Time: 07/27/20 1258   Time Out: Correct patient;Correct site;Correct procedure  Maximum sterile barrier precautions: Hand hygiene;Cap;Mask;Sterile gown;Sterile gloves;Large sterile sheet  Site Prep: Chlorhexidine (preferred)  Local Anes...  Site Condition No complications  Blue Lumen Status Heparin locked;Capped (Central line)  Red Lumen Status Heparin locked;Blood return noted  Catheter fill solution Heparin 1000 units/ml  Catheter fill volume (Arterial) 1.6 cc  Catheter fill volume (Venous) 1.6  Dressing Type Gauze/Drain sponge  Dressing Status Old drainage;Intact  Post treatment catheter status Capped and Clamped

## 2020-07-28 NOTE — Progress Notes (Signed)
PROGRESS NOTE  Cassandra Dyer  DOB: 1941/09/19  PCP: Biagio Borg, MD PTW:[id number]  DOA: 07/26/2020  LOS: 1 day   Chief Complaint  Patient presents with  . Vomiting   Brief narrative: Cassandra Dyer is a 79 y.o. female with PMH of HTN, HLD, CKD stage III, hepatitis C, chronic anemia, COPD, right nephrectomy in 2018 for renal cell cancer. Patient presented to the ED on 07/26/2020 with complaint of vomiting for 24 hours, nonbloody, nonbilious leading to generalized weakness.  In the ED, she was noted to have potassium elevated to 7.2, creatinine significantly elevated 12.3. EKG changes minimal. Patient was given Lokelma, IV dextrose, insulin, calcium gluconate and sodium bicarbonate. Nephrology consultation was obtained. Patient was admitted to hospitalist service.  Subjective: Patient was seen and examined this morning. Propped up in bed.  Not in distress.  Remains alert, awake, oriented x3. She underwent dialysis earlier this morning, reportedly 1.6 L was removed. Labs from this morning which seems prior to dialysis shows potassium level improved to 4.7 today, creatinine remains high at 12.35, WBC improved to 10.1. Patient is still having diarrhea but feels better than before.  Stool sample sent yesterday.  Pending report. Urine output has not been monitored  Assessment/Plan: AKI on CKD 3 Severe metabolic acidosis History of right nephrectomy secondary to RCC -Baseline creatinine 1.3-1.6 in recent months -Presented with significant elevated creatinine to 12.3 and serum bicarb low at 13 -Unclear cause of acute deterioration of kidney function. -Nephrology involved. Renal ultrasound ruled out postoperative course.  Serological work-up sent. -8/14, underwent tunnel catheter placement -8/15, this morning patient underwent dialysis, reportedly 1.6 L was removed. -Patient currently remains on 50 mill per hour of sodium bicarbonate drip. -Bicarbonate level still remains low but  somewhat better at 16.  Acute hyperkalemia -Presented with calcium at 7.2. -Temporized in the ED with Lokelma, IV dextrose, insulin, calcium gluconate and sodium bicarbonate -Repeat potassium this morning is 4.7.  Patient also underwent dialysis earlier this morning.. -Nephrology following.  Diarrhea -For 3 to 4 days prior to presentation.  Probably causing dehydration and renal failure. -GI pathogen panel was sent.  Pending report.  Patient states it seems to be slowing down.  Currently on depends.  Clinically monitor.  -No fever.  WBC count improving.  Not on antibiotics.  Essential hypertension -Blood pressure in last 24 hours has remained in 160s 170s. -Home meds include Toprol 100 g daily, amlodipine 10 mg daily, HCTZ 25 mg daily, telmisartan 80 mg daily. -Currently on Toprol and amlodipine.  Continue to hold HCTZ and telmisartan. -IV hydralazine as needed.  Mixed hyperlipidemia -Continue Crestor   History of COPD -Stable respiratory status -As needed inhaler  Mobility: Encourage ambulation Code Status:   Code Status: DNR  Nutritional status: Body mass index is 29.96 kg/m.     Diet Order            Diet renal with fluid restriction Room service appropriate? Yes; Fluid consistency: Thin  Diet effective now                 DVT prophylaxis: heparin injection 5,000 Units Start: 07/27/20 0600   Antimicrobials:  None Fluid: Sodium bicarbonate 50 mill per hour  Consultants: Nephrology, IR Family Communication:  None at bedside  Status is: Inpatient  Remains inpatient appropriate because:Persistent severe electrolyte disturbances, Ongoing diagnostic testing needed not appropriate for outpatient work up and IV treatments appropriate due to intensity of illness or inability to take PO   Dispo:  The patient is from: Home              Anticipated d/c is to: Home              Anticipated d/c date is: > 3 days              Patient currently is not medically stable to  d/c.  Infusions:  . sodium chloride    . sodium chloride    .  sodium bicarbonate (isotonic) infusion in sterile water 50 mL/hr at 07/27/20 2300    Scheduled Meds: . amLODipine  10 mg Oral Daily  . Chlorhexidine Gluconate Cloth  6 each Topical Q0600  . heparin  5,000 Units Subcutaneous Q8H  . heparin sodium (porcine)      . metoprolol succinate  100 mg Oral Daily  . multivitamin with minerals  1 tablet Oral Daily  . rosuvastatin  10 mg Oral Daily  . sodium zirconium cyclosilicate  10 g Oral BID    Antimicrobials: Anti-infectives (From admission, onward)   Start     Dose/Rate Route Frequency Ordered Stop   07/27/20 1253  ceFAZolin (ANCEF) IVPB 2g/100 mL premix        over 30 Minutes Intravenous Continuous PRN 07/27/20 1253 07/27/20 1253   07/27/20 1244  ceFAZolin (ANCEF) 2-4 GM/100ML-% IVPB       Note to Pharmacy: Margaretmary Dys   : cabinet override      07/27/20 1244 07/28/20 0044      PRN meds: sodium chloride, sodium chloride, acetaminophen **OR** acetaminophen, alteplase, heparin, hydrALAZINE, lidocaine (PF), lidocaine-prilocaine, ondansetron **OR** ondansetron (ZOFRAN) IV, pentafluoroprop-tetrafluoroeth, polyethylene glycol   Objective: Vitals:   07/28/20 0628 07/28/20 0727  BP: (!) 168/79 (!) 167/70  Pulse: 80 84  Resp: 19 16  Temp: 98.7 F (37.1 C) 98.3 F (36.8 C)  SpO2: 100% 100%    Intake/Output Summary (Last 24 hours) at 07/28/2020 0930 Last data filed at 07/28/2020 0900 Gross per 24 hour  Intake 1658.37 ml  Output 995 ml  Net 663.37 ml   Filed Weights   07/27/20 0450  Weight: 74.3 kg   Weight change:  Body mass index is 29.96 kg/m.   Physical Exam: General exam: Appears calm and comfortable.  Not in physical distress Skin: No rashes, lesions or ulcers. HEENT: Atraumatic, normocephalic, supple neck, no obvious bleeding Lungs: Clear to auscultation bilaterally.  Has temporary dialysis catheter placed on right anterior chest wall. CVS: Regular  rate and rhythm, no murmur GI/Abd soft, nontender, nondistended, bowel sound present CNS: Alert, awake, oriented to place and person Psychiatry: Mood appropriate Extremities: Slightly puffy extremities but no discernible edema.,  No calf tenderness  Data Review: I have personally reviewed the laboratory data and studies available.  Recent Labs  Lab 07/26/20 1747 07/27/20 0734 07/28/20 0246  WBC 12.8* 13.2* 10.1  NEUTROABS 11.4* 11.9* 7.8*  HGB 13.4 12.4 10.7*  HCT 43.5 38.9 32.4*  MCV 93.8 91.3 90.5  PLT 242 218 171   Recent Labs  Lab 07/26/20 1747 07/27/20 0115 07/27/20 0734 07/28/20 0246 07/28/20 0355  NA 139 137 138 138  --   K 6.6* 7.2* 5.6* 4.7  --   CL 110 111 110 106  --   CO2 13* 12* 14* 16*  --   GLUCOSE 113* 112* 146* 103*  --   BUN 115* 127* 128* 129*  --   CREATININE 12.06* 12.34* 12.33* 12.35*  --   CALCIUM 9.2 8.6* 8.4* 8.1*  --  MG  --  2.5*  --  2.4  --   PHOS  --   --  7.4*  --  8.1*   Lab Results  Component Value Date   HGBA1C 5.6 04/08/2020       Component Value Date/Time   CHOL 112 06/29/2019 1622   TRIG 172.0 (H) 06/29/2019 1622   HDL 32.30 (L) 06/29/2019 1622   CHOLHDL 3 06/29/2019 1622   VLDL 34.4 06/29/2019 1622   LDLCALC 45 06/29/2019 1622    Signed, Terrilee Croak, MD [facility] Pager: [phone] (Secure Chat preferred). 07/28/2020

## 2020-07-29 LAB — IRON AND TIBC
Iron: 32 ug/dL (ref 28–170)
Saturation Ratios: 14 % (ref 10.4–31.8)
TIBC: 224 ug/dL — ABNORMAL LOW (ref 250–450)
UIBC: 192 ug/dL

## 2020-07-29 LAB — CBC
HCT: 32.9 % — ABNORMAL LOW (ref 36.0–46.0)
Hemoglobin: 10.5 g/dL — ABNORMAL LOW (ref 12.0–15.0)
MCH: 29.7 pg (ref 26.0–34.0)
MCHC: 31.9 g/dL (ref 30.0–36.0)
MCV: 92.9 fL (ref 80.0–100.0)
Platelets: 131 10*3/uL — ABNORMAL LOW (ref 150–400)
RBC: 3.54 MIL/uL — ABNORMAL LOW (ref 3.87–5.11)
RDW: 16.9 % — ABNORMAL HIGH (ref 11.5–15.5)
WBC: 9.3 10*3/uL (ref 4.0–10.5)
nRBC: 0 % (ref 0.0–0.2)

## 2020-07-29 LAB — RENAL FUNCTION PANEL
Albumin: 2.3 g/dL — ABNORMAL LOW (ref 3.5–5.0)
Anion gap: 11 (ref 5–15)
BUN: 67 mg/dL — ABNORMAL HIGH (ref 8–23)
CO2: 24 mmol/L (ref 22–32)
Calcium: 8.1 mg/dL — ABNORMAL LOW (ref 8.9–10.3)
Chloride: 105 mmol/L (ref 98–111)
Creatinine, Ser: 7.14 mg/dL — ABNORMAL HIGH (ref 0.44–1.00)
GFR calc Af Amer: 6 mL/min — ABNORMAL LOW (ref >60)
GFR calc non Af Amer: 5 mL/min — ABNORMAL LOW (ref >60)
Glucose, Bld: 94 mg/dL (ref 70–99)
Phosphorus: 6.3 mg/dL — ABNORMAL HIGH (ref 2.5–4.6)
Potassium: 3.9 mmol/L (ref 3.5–5.1)
Sodium: 140 mmol/L (ref 135–145)

## 2020-07-29 LAB — PROTEIN ELECTROPHORESIS, SERUM
A/G Ratio: 1.3 (ref 0.7–1.7)
Albumin ELP: 2.7 g/dL — ABNORMAL LOW (ref 2.9–4.4)
Alpha-1-Globulin: 0.3 g/dL (ref 0.0–0.4)
Alpha-2-Globulin: 0.6 g/dL (ref 0.4–1.0)
Beta Globulin: 0.6 g/dL — ABNORMAL LOW (ref 0.7–1.3)
Gamma Globulin: 0.6 g/dL (ref 0.4–1.8)
Globulin, Total: 2.1 g/dL — ABNORMAL LOW (ref 2.2–3.9)
Total Protein ELP: 4.8 g/dL — ABNORMAL LOW (ref 6.0–8.5)

## 2020-07-29 LAB — ANTINUCLEAR ANTIBODIES, IFA: ANA Ab, IFA: POSITIVE — AB

## 2020-07-29 LAB — ANTI-DNA ANTIBODY, DOUBLE-STRANDED: ds DNA Ab: <1 IU/mL (ref 0–9)

## 2020-07-29 LAB — FERRITIN: Ferritin: 59 ng/mL (ref 11–307)

## 2020-07-29 LAB — GLOMERULAR BASEMENT MEMBRANE ANTIBODIES: GBM Ab: 2 units (ref 0–20)

## 2020-07-29 LAB — FANA STAINING PATTERNS: Homogeneous Pattern: 1 — ABNORMAL HIGH

## 2020-07-29 MED ORDER — SODIUM CHLORIDE 0.9 % IV SOLN
250.0000 mg | Freq: Every day | INTRAVENOUS | Status: AC
  Administered 2020-07-29 – 2020-08-01 (×4): 250 mg via INTRAVENOUS
  Filled 2020-07-29 (×5): qty 20

## 2020-07-29 NOTE — Progress Notes (Signed)
PROGRESS NOTE  Cassandra Dyer  DOB: Nov 06, 1941  PCP: Biagio Borg, MD LZJ:[id number]  DOA: 07/26/2020  LOS: 2 days   Chief Complaint  Patient presents with  . Vomiting   Brief narrative: Cassandra Dyer is a 79 y.o. female with PMH of HTN, HLD, CKD stage III, hepatitis C, chronic anemia, COPD, right nephrectomy in 2018 for renal cell cancer. Patient presented to the ED on 07/26/2020 with complaint of vomiting for 24 hours, nonbloody, nonbilious leading to generalized weakness.  In the ED, she was noted to have potassium elevated to 7.2, creatinine significantly elevated 12.3. EKG changes minimal. Patient was given Lokelma, IV dextrose, insulin, calcium gluconate and sodium bicarbonate. Nephrology consultation was obtained. Patient was admitted to hospitalist service.  Subjective: Patient was seen and examined this morning. Sitting up at the edge of the bed.  Not in distress.  No new symptoms.  Diarrhea slowing down.  Only one episode this morning last 24 hours.  GI pathogen panel negative.  Assessment/Plan: AKI on CKD 3 Severe metabolic acidosis History of right nephrectomy secondary to RCC -Baseline creatinine 1.3-1.6 in recent months -Presented with significant elevated creatinine to 12.3 and serum bicarb low at 13 -Unclear cause of acute deterioration of kidney function. -Nephrology involved. Renal ultrasound ruled out postoperative course.  Serological work-up sent. -8/14, underwent tunnel catheter placement -8/15, patient was initiated on dialysis.  Noted plan for repeat dialysis today. -Off bicarbonate drip.  Acute hyperkalemia -Presented with calcium at 7.2. -Temporized in the ED with Lokelma, IV dextrose, insulin, calcium gluconate and sodium bicarbonate -Nephrology following.  Diarrhea -Patient was having diarrhea for 3 to 4 days prior to presentation.  Probably causing dehydration and renal failure. -GI pathogen panel negative.  Not on antibiotics.  Clinically  improving. Only one episode of loose motion last 24 hours this morning.   -No fever. WBC count improving.    Essential hypertension -Home meds include Toprol 100 g daily, amlodipine 10 mg daily, HCTZ 25 mg daily, telmisartan 80 mg daily. -Currently on Toprol and amlodipine.  Continue to hold HCTZ and telmisartan. -Blood pressure in last 24 hours has remained in 160s-170s.  Nephrology following. -IV hydralazine as needed.  Mixed hyperlipidemia -Continue Crestor   History of COPD -Stable respiratory status -As needed inhaler  Mobility: Encourage ambulation Code Status:   Code Status: DNR  Nutritional status: Body mass index is 29.96 kg/m.     Diet Order            Diet renal with fluid restriction Room service appropriate? Yes; Fluid consistency: Thin  Diet effective now                 DVT prophylaxis: heparin injection 5,000 Units Start: 07/27/20 0600   Antimicrobials:  None Fluid: None  Consultants: Nephrology, IR Family Communication:  None at bedside  Status is: Inpatient  Remains inpatient appropriate because:Persistent severe electrolyte disturbances, Ongoing diagnostic testing needed not appropriate for outpatient work up and IV treatments appropriate due to intensity of illness or inability to take PO   Dispo: The patient is from: Home              Anticipated d/c is to: Home              Anticipated d/c date is: > 3 days              Patient currently is not medically stable to d/c.  Infusions:  . sodium chloride    .  sodium chloride    . ferric gluconate (FERRLECIT/NULECIT) IV    .  sodium bicarbonate (isotonic) infusion in sterile water 50 mL/hr at 07/27/20 2300    Scheduled Meds: . amLODipine  10 mg Oral Daily  . Chlorhexidine Gluconate Cloth  6 each Topical Q0600  . heparin  5,000 Units Subcutaneous Q8H  . metoprolol succinate  100 mg Oral Daily  . multivitamin with minerals  1 tablet Oral Daily  . rosuvastatin  10 mg Oral Daily  . sodium  zirconium cyclosilicate  10 g Oral BID    Antimicrobials: Anti-infectives (From admission, onward)   Start     Dose/Rate Route Frequency Ordered Stop   07/27/20 1253  ceFAZolin (ANCEF) IVPB 2g/100 mL premix        over 30 Minutes Intravenous Continuous PRN 07/27/20 1253 07/27/20 1253   07/27/20 1244  ceFAZolin (ANCEF) 2-4 GM/100ML-% IVPB       Note to Pharmacy: Margaretmary Dys   : cabinet override      07/27/20 1244 07/28/20 0044      PRN meds: sodium chloride, sodium chloride, acetaminophen **OR** acetaminophen, alteplase, heparin, hydrALAZINE, lidocaine (PF), lidocaine-prilocaine, ondansetron **OR** ondansetron (ZOFRAN) IV, pentafluoroprop-tetrafluoroeth, polyethylene glycol   Objective: Vitals:   07/29/20 0800 07/29/20 1117  BP: (!) 144/66 (!) 176/83  Pulse: 78 79  Resp: 14 16  Temp: 97.9 F (36.6 C) 98.3 F (36.8 C)  SpO2: 94% 98%    Intake/Output Summary (Last 24 hours) at 07/29/2020 1122 Last data filed at 07/28/2020 1630 Gross per 24 hour  Intake 250 ml  Output --  Net 250 ml   Filed Weights   07/27/20 0450  Weight: 74.3 kg   Weight change:  Body mass index is 29.96 kg/m.   Physical Exam: General exam: Appears calm and comfortable.  Not in physical distress Skin: No rashes, lesions or ulcers. HEENT: Atraumatic, normocephalic, supple neck, no obvious bleeding Lungs: Clear to auscultation bilaterally.  Has temporary dialysis catheter placed on right anterior chest wall. CVS: Regular rate and rhythm, no murmur GI/Abd soft, nontender, nondistended, bowel sound present CNS: Alert, awake, oriented to place and person Psychiatry: Mood appropriate Extremities: Pedal edema bilaterally.  Has puffy bilateral upper extremities as well.  No calf tenderness.  Data Review: I have personally reviewed the laboratory data and studies available.  Recent Labs  Lab 07/26/20 1747 07/27/20 0734 07/28/20 0246  WBC 12.8* 13.2* 10.1  NEUTROABS 11.4* 11.9* 7.8*  HGB 13.4 12.4  10.7*  HCT 43.5 38.9 32.4*  MCV 93.8 91.3 90.5  PLT 242 218 171   Recent Labs  Lab 07/26/20 1747 07/27/20 0115 07/27/20 0734 07/28/20 0246 07/28/20 0355  NA 139 137 138 138  --   K 6.6* 7.2* 5.6* 4.7  --   CL 110 111 110 106  --   CO2 13* 12* 14* 16*  --   GLUCOSE 113* 112* 146* 103*  --   BUN 115* 127* 128* 129*  --   CREATININE 12.06* 12.34* 12.33* 12.35*  --   CALCIUM 9.2 8.6* 8.4* 8.1*  --   MG  --  2.5*  --  2.4  --   PHOS  --   --  7.4*  --  8.1*   Lab Results  Component Value Date   HGBA1C 5.6 04/08/2020       Component Value Date/Time   CHOL 112 06/29/2019 1622   TRIG 172.0 (H) 06/29/2019 1622   HDL 32.30 (L) 06/29/2019 1622   CHOLHDL 3  06/29/2019 1622   VLDL 34.4 06/29/2019 1622   LDLCALC 45 06/29/2019 1622    Signed, Terrilee Croak, MD [facility] Pager: [phone] (Secure Chat preferred). 07/29/2020

## 2020-07-29 NOTE — Progress Notes (Addendum)
[facility] Progress Note   79 y.o.female HTN, HLD, HCV, COPD, rt nephrectomy in 2018 for RCC p/w  nausea and vomiting and AKI on CKD  Assessment/ Plan:   1. Severe oliguric AKI on CKD3: S/p rt nephrectomy BL Cr 1.3-1.6 range in recent months presumably chronic component secondary to HTN.  No obvious obstruction.  Poor response to Lasix.  Not a biopsy candidate given solitary kidney.  Initiated dialysis on 8/14.  We will continue dialysis for now and follow-up serologies -TDC in place -Dialysis 2/3 today -Consider permanent access - Avoid contrast and other nephrotoxins if possible. - Renal dose medications for a GFR under 15 ml/min. - Sending serological workup as well + gave a trial of Lasix (poor response) - Strict I&O's, daily weights   2. Hyperkalemia -secondary to renal failure.  Now improved with Lokelma and dialysis.  3. Anemia  -Iron sat 14 and ferritin 59.  Will start iron load 1000mg  over 4 doses  4. Dyspnea with h/o COPD + b/l pleural effusions 5. HTN -acceptable continue to monitor 6. Remote h/o HCV  Subjective:   Feeling well today without specific complaint.  Denies chest pain.  Mild shortness of breath present   Objective:   BP (!) 144/66 (BP Location: Right Arm)   Pulse 78   Temp 97.9 F (36.6 C) (Oral)   Resp 14   Ht 5\' 2"  (1.575 m)   Wt 74.3 kg   SpO2 94%   BMI 29.96 kg/m   Intake/Output Summary (Last 24 hours) at 07/29/2020 1028 Last data filed at 07/28/2020 1630 Gross per 24 hour  Intake 250 ml  Output 120 ml  Net 130 ml   Weight change:   Physical Exam: GEN: NAD, lying in bed HEENT: No conjunctival pallor, no nasal discharge LUNGS: Bilateral chest rise with no increased work of breathing CV: Normal rate, no audible murmurs ABD: SNDNT +BS  EXT: Tr  lower extremity edema, 1+ LUE edema  Imaging: IR Fluoro Guide CV Line Right  Result Date: 07/27/2020 CLINICAL DATA:  Acute kidney injury, uremia and need for emergent  hemodialysis. Request has been made to place a tunneled hemodialysis catheter due to predicted need for longer term hemodialysis. EXAM: TUNNELED CENTRAL VENOUS HEMODIALYSIS CATHETER PLACEMENT WITH ULTRASOUND AND FLUOROSCOPIC GUIDANCE ANESTHESIA/SEDATION: 2.0 mg IV Versed; 50 mcg IV Fentanyl. Total Moderate Sedation Time:   20 minutes. The patient's level of consciousness and physiologic status were continuously monitored during the procedure by Radiology nursing. MEDICATIONS: 2 g IV Ancef. FLUOROSCOPY TIME:  24 seconds.  2.0 mGy. PROCEDURE: The procedure, risks, benefits, and alternatives were explained to the patient. Questions regarding the procedure were encouraged and answered. The patient understands and consents to the procedure. A timeout was performed prior to initiating the procedure. The right neck and chest were prepped with chlorhexidine in a sterile fashion, and a sterile drape was applied covering the operative field. Maximum barrier sterile technique with sterile gowns and gloves were used for the procedure. Local anesthesia was provided with 1% lidocaine. Ultrasound was used to confirm patency of the right internal jugular vein. After creating a small venotomy incision, a 21 gauge needle was advanced into the right internal jugular vein under direct, real-time ultrasound guidance. Ultrasound image documentation was performed. After securing guidewire access, an 8 Fr dilator was placed. A J-wire was kinked to measure appropriate catheter length. A Palindrome tunneled hemodialysis catheter measuring 19 cm from tip to cuff was chosen for placement. This was tunneled in a  retrograde fashion from the chest wall to the venotomy incision. At the venotomy, serial dilatation was performed and a 15 Fr peel-away sheath was placed over a guidewire. The catheter was then placed through the sheath and the sheath removed. Final catheter positioning was confirmed and documented with a fluoroscopic spot image. The  catheter was aspirated, flushed with saline, and injected with appropriate volume heparin dwells. The venotomy incision was closed with subcuticular 4-0 Vicryl. Dermabond was applied to the incision. The catheter exit site was secured with 0-Prolene retention sutures. COMPLICATIONS: None.  No pneumothorax. FINDINGS: After catheter placement, the tip lies in the right atrium. The catheter aspirates normally and is ready for immediate use. IMPRESSION: Placement of tunneled hemodialysis catheter via the right internal jugular vein. The catheter tip lies in the right atrium. The catheter is ready for immediate use. Electronically Signed   By: Aletta Edouard M.D.   On: 07/27/2020 15:00   IR US Guide Vasc Access Right  Result Date: 07/27/2020 CLINICAL DATA:  Acute kidney injury, uremia and need for emergent hemodialysis. Request has been made to place a tunneled hemodialysis catheter due to predicted need for longer term hemodialysis. EXAM: TUNNELED CENTRAL VENOUS HEMODIALYSIS CATHETER PLACEMENT WITH ULTRASOUND AND FLUOROSCOPIC GUIDANCE ANESTHESIA/SEDATION: 2.0 mg IV Versed; 50 mcg IV Fentanyl. Total Moderate Sedation Time:   20 minutes. The patient's level of consciousness and physiologic status were continuously monitored during the procedure by Radiology nursing. MEDICATIONS: 2 g IV Ancef. FLUOROSCOPY TIME:  24 seconds.  2.0 mGy. PROCEDURE: The procedure, risks, benefits, and alternatives were explained to the patient. Questions regarding the procedure were encouraged and answered. The patient understands and consents to the procedure. A timeout was performed prior to initiating the procedure. The right neck and chest were prepped with chlorhexidine in a sterile fashion, and a sterile drape was applied covering the operative field. Maximum barrier sterile technique with sterile gowns and gloves were used for the procedure. Local anesthesia was provided with 1% lidocaine. Ultrasound was used to confirm patency of  the right internal jugular vein. After creating a small venotomy incision, a 21 gauge needle was advanced into the right internal jugular vein under direct, real-time ultrasound guidance. Ultrasound image documentation was performed. After securing guidewire access, an 8 Fr dilator was placed. A J-wire was kinked to measure appropriate catheter length. A Palindrome tunneled hemodialysis catheter measuring 19 cm from tip to cuff was chosen for placement. This was tunneled in a retrograde fashion from the chest wall to the venotomy incision. At the venotomy, serial dilatation was performed and a 15 Fr peel-away sheath was placed over a guidewire. The catheter was then placed through the sheath and the sheath removed. Final catheter positioning was confirmed and documented with a fluoroscopic spot image. The catheter was aspirated, flushed with saline, and injected with appropriate volume heparin dwells. The venotomy incision was closed with subcuticular 4-0 Vicryl. Dermabond was applied to the incision. The catheter exit site was secured with 0-Prolene retention sutures. COMPLICATIONS: None.  No pneumothorax. FINDINGS: After catheter placement, the tip lies in the right atrium. The catheter aspirates normally and is ready for immediate use. IMPRESSION: Placement of tunneled hemodialysis catheter via the right internal jugular vein. The catheter tip lies in the right atrium. The catheter is ready for immediate use. Electronically Signed   By: Aletta Edouard M.D.   On: 07/27/2020 15:00    Labs: BMET Recent Labs  Lab 07/26/20 1747 07/27/20 0115 07/27/20 4650 07/28/20 0246 07/28/20 0355  NA 139 137 138 138  --   K 6.6* 7.2* 5.6* 4.7  --   CL 110 111 110 106  --   CO2 13* 12* 14* 16*  --   GLUCOSE 113* 112* 146* 103*  --   BUN 115* 127* 128* 129*  --   CREATININE 12.06* 12.34* 12.33* 12.35*  --   CALCIUM 9.2 8.6* 8.4* 8.1*  --   PHOS  --   --  7.4*  --  8.1*   CBC Recent Labs  Lab 07/26/20 1747  07/27/20 0734 07/28/20 0246  WBC 12.8* 13.2* 10.1  NEUTROABS 11.4* 11.9* 7.8*  HGB 13.4 12.4 10.7*  HCT 43.5 38.9 32.4*  MCV 93.8 91.3 90.5  PLT 242 218 171    Medications:    . amLODipine  10 mg Oral Daily  . Chlorhexidine Gluconate Cloth  6 each Topical Q0600  . heparin  5,000 Units Subcutaneous Q8H  . metoprolol succinate  100 mg Oral Daily  . multivitamin with minerals  1 tablet Oral Daily  . rosuvastatin  10 mg Oral Daily  . sodium zirconium cyclosilicate  10 g Oral BID      Reesa Chew  07/29/2020, 10:28 AM

## 2020-07-29 NOTE — Plan of Care (Signed)
  Problem: Education: Goal: Knowledge of disease and its progression will improve Outcome: Adequate for Discharge   Problem: Health Behavior/Discharge Planning: Goal: Ability to manage health-related needs will improve Outcome: Adequate for Discharge   Problem: Clinical Measurements: Goal: Complications related to the disease process or treatment will be avoided or minimized Outcome: Adequate for Discharge Goal: Dialysis access will remain free of complications Outcome: Adequate for Discharge   Problem: Activity: Goal: Activity intolerance will improve Outcome: Adequate for Discharge   Problem: Fluid Volume: Goal: Fluid volume balance will be maintained or improved Outcome: Adequate for Discharge   Problem: Nutritional: Goal: Ability to make appropriate dietary choices will improve Outcome: Adequate for Discharge   Problem: Respiratory: Goal: Respiratory symptoms related to disease process will be avoided Outcome: Adequate for Discharge   Problem: Self-Concept: Goal: Body image disturbance will be avoided or minimized Outcome: Adequate for Discharge   Problem: Urinary Elimination: Goal: Progression of disease will be identified and treated Outcome: Adequate for Discharge   Problem: Education: Goal: Knowledge of General Education information will improve Description: Including pain rating scale, medication(s)/side effects and non-pharmacologic comfort measures Outcome: Adequate for Discharge   Problem: Health Behavior/Discharge Planning: Goal: Ability to manage health-related needs will improve Outcome: Adequate for Discharge   Problem: Clinical Measurements: Goal: Ability to maintain clinical measurements within normal limits will improve Outcome: Adequate for Discharge Goal: Will remain free from infection Outcome: Adequate for Discharge Goal: Diagnostic test results will improve Outcome: Adequate for Discharge Goal: Respiratory complications will  improve Outcome: Adequate for Discharge Goal: Cardiovascular complication will be avoided Outcome: Adequate for Discharge   Problem: Activity: Goal: Risk for activity intolerance will decrease Outcome: Adequate for Discharge   Problem: Nutrition: Goal: Adequate nutrition will be maintained Outcome: Adequate for Discharge   Problem: Coping: Goal: Level of anxiety will decrease Outcome: Adequate for Discharge   Problem: Elimination: Goal: Will not experience complications related to bowel motility Outcome: Adequate for Discharge Goal: Will not experience complications related to urinary retention Outcome: Adequate for Discharge   Problem: Pain Managment: Goal: General experience of comfort will improve Outcome: Adequate for Discharge   Problem: Safety: Goal: Ability to remain free from injury will improve Outcome: Adequate for Discharge   Problem: Skin Integrity: Goal: Risk for impaired skin integrity will decrease Outcome: Adequate for Discharge

## 2020-07-30 LAB — ANCA TITERS
Atypical P-ANCA titer: <1:20 {titer}
C-ANCA: <1:20 {titer}
P-ANCA: <1:20 {titer}

## 2020-07-30 MED ORDER — HYDRALAZINE HCL 25 MG PO TABS
25.0000 mg | ORAL_TABLET | Freq: Three times a day (TID) | ORAL | Status: DC
  Administered 2020-07-30 – 2020-07-31 (×3): 25 mg via ORAL
  Filled 2020-07-30 (×3): qty 1

## 2020-07-30 NOTE — Progress Notes (Signed)
PROGRESS NOTE  Cassandra Dyer  DOB: 12/07/1941  PCP: Biagio Borg, MD WUJ:[id number]  DOA: 07/26/2020  LOS: 3 days   Chief Complaint  Patient presents with  . Vomiting   Brief narrative: Cassandra Dyer is a 79 y.o. female with PMH of HTN, HLD, CKD stage III, hepatitis C, chronic anemia, COPD, right nephrectomy in 2018 for renal cell cancer. Patient presented to the ED on 07/26/2020 with complaint of vomiting for 24 hours, nonbloody, nonbilious leading to generalized weakness.  In the ED, she was noted to have potassium elevated to 7.2, creatinine significantly elevated 12.3. EKG changes minimal. Patient was given Lokelma, IV dextrose, insulin, calcium gluconate and sodium bicarbonate. Nephrology consultation was obtained. Patient was admitted to hospitalist service.  Subjective: Patient was seen and examined this morning. Not in distress.  Diarrhea improved.  AKI seems to be improving  Assessment/Plan: AKI on CKD 3 Severe metabolic acidosis History of right nephrectomy secondary to RCC -Baseline creatinine 1.3-1.6 in recent months -Presented with significant elevated creatinine to 12.3 and serum bicarb low at 13 -Unclear cause of acute deterioration of kidney function. -Nephrology following.  -Renal ultrasound ruled out postoperative course.  Serological work-up sent. -8/14, underwent tunnel catheter placement -8/15, patient was initiated on dialysis.  Last dialysis on 8/16. -AKI seems to be resolving. -Metabolic acidosis improved with dialysis and bicarbonate drip.  Currently off drip.  Acute hyperkalemia -Presented with calcium at 7.2. -Temporized in the ED with Lokelma, IV dextrose, insulin, calcium gluconate and sodium bicarbonate -Potassium level remains normal with dialysis  Acute diarrhea - POA -Patient was having diarrhea for 3 to 4 days prior to presentation.  Probably leading to dehydration and renal failure. -GI pathogen panel negative.  Not on antibiotics.   Clinically improving.  -No fever. WBC count back to normal.  Essential hypertension -Home meds include Toprol 100 g daily, amlodipine 10 mg daily, HCTZ 25 mg daily, telmisartan 80 mg daily. -Currently on Toprol and amlodipine.  Continue to hold HCTZ and telmisartan. -Blood pressure in last 24 hours remains elevated between 150 and 180.  Defer to nephrology. -IV hydralazine as needed.  Mixed hyperlipidemia -Continue Crestor   History of COPD -Stable respiratory status -As needed inhaler  Mobility: Encourage ambulation Code Status:   Code Status: DNR  Nutritional status: Body mass index is 30 kg/m.     Diet Order            Diet renal with fluid restriction Room service appropriate? Yes; Fluid consistency: Thin  Diet effective now                 DVT prophylaxis: heparin injection 5,000 Units Start: 07/27/20 0600   Antimicrobials:  None Fluid: None  Consultants: Nephrology, IR Family Communication:  None at bedside  Status is: Inpatient  Remains inpatient appropriate because:Persistent severe electrolyte disturbances, Ongoing diagnostic testing needed not appropriate for outpatient work up and IV treatments appropriate due to intensity of illness or inability to take PO   Dispo: The patient is from: Home              Anticipated d/c is to: Home              Anticipated d/c date is: > 3 days              Patient currently is not medically stable to d/c.  Infusions:  . ferric gluconate (FERRLECIT/NULECIT) IV 250 mg (07/29/20 1355)  .  sodium bicarbonate (isotonic) infusion in  sterile water 50 mL/hr at 07/27/20 2300    Scheduled Meds: . amLODipine  10 mg Oral Daily  . Chlorhexidine Gluconate Cloth  6 each Topical Q0600  . heparin  5,000 Units Subcutaneous Q8H  . metoprolol succinate  100 mg Oral Daily  . multivitamin with minerals  1 tablet Oral Daily  . rosuvastatin  10 mg Oral Daily  . sodium zirconium cyclosilicate  10 g Oral BID     Antimicrobials: Anti-infectives (From admission, onward)   Start     Dose/Rate Route Frequency Ordered Stop   07/27/20 1253  ceFAZolin (ANCEF) IVPB 2g/100 mL premix        over 30 Minutes Intravenous Continuous PRN 07/27/20 1253 07/27/20 1253   07/27/20 1244  ceFAZolin (ANCEF) 2-4 GM/100ML-% IVPB       Note to Pharmacy: Margaretmary Dys   : cabinet override      07/27/20 1244 07/28/20 0044      PRN meds: acetaminophen **OR** acetaminophen, hydrALAZINE, ondansetron **OR** ondansetron (ZOFRAN) IV, polyethylene glycol   Objective: Vitals:   07/30/20 0751 07/30/20 1002  BP: (!) 151/69 (!) 171/96  Pulse: 83 78  Resp: 17 16  Temp: 98.5 F (36.9 C) 98.7 F (37.1 C)  SpO2: 94% 96%    Intake/Output Summary (Last 24 hours) at 07/30/2020 1132 Last data filed at 07/29/2020 1800 Gross per 24 hour  Intake 105.54 ml  Output 1000 ml  Net -894.46 ml   Filed Weights   07/27/20 0450 07/29/20 1117 07/29/20 1454  Weight: 74.3 kg 75.4 kg 74.4 kg   Weight change:  Body mass index is 30 kg/m.   Physical Exam: General exam: Appears calm and comfortable.  Not in physical distress.  Generalized anasarca seems to be improving. Skin: No rashes, lesions or ulcers. HEENT: Atraumatic, normocephalic, supple neck, no obvious bleeding Lungs: Clear to auscultation bilaterally.  Has temporary dialysis catheter placed on right anterior chest wall. CVS: Regular rate and rhythm, GI/Abd soft, nontender, nondistended, bowel sound present CNS: Alert, awake, oriented to place and person Psychiatry: Mood appropriate Extremities: Pedal edema bilaterally improving as well.  Data Review: I have personally reviewed the laboratory data and studies available.  Recent Labs  Lab 07/26/20 1747 07/27/20 0734 07/28/20 0246 07/29/20 1137  WBC 12.8* 13.2* 10.1 9.3  NEUTROABS 11.4* 11.9* 7.8*  --   HGB 13.4 12.4 10.7* 10.5*  HCT 43.5 38.9 32.4* 32.9*  MCV 93.8 91.3 90.5 92.9  PLT 242 218 171 131*   Recent  Labs  Lab 07/26/20 1747 07/27/20 0115 07/27/20 0734 07/28/20 0246 07/28/20 0355 07/29/20 1137  NA 139 137 138 138  --  140  K 6.6* 7.2* 5.6* 4.7  --  3.9  CL 110 111 110 106  --  105  CO2 13* 12* 14* 16*  --  24  GLUCOSE 113* 112* 146* 103*  --  94  BUN 115* 127* 128* 129*  --  67*  CREATININE 12.06* 12.34* 12.33* 12.35*  --  7.14*  CALCIUM 9.2 8.6* 8.4* 8.1*  --  8.1*  MG  --  2.5*  --  2.4  --   --   PHOS  --   --  7.4*  --  8.1* 6.3*   Lab Results  Component Value Date   HGBA1C 5.6 04/08/2020       Component Value Date/Time   CHOL 112 06/29/2019 1622   TRIG 172.0 (H) 06/29/2019 1622   HDL 32.30 (L) 06/29/2019 1622   CHOLHDL 3 06/29/2019  1622   VLDL 34.4 06/29/2019 1622   LDLCALC 45 06/29/2019 1622    Signed, Terrilee Croak, MD [facility] Pager: [phone] (Secure Chat preferred). 07/30/2020

## 2020-07-30 NOTE — Plan of Care (Signed)
  Problem: Education: Goal: Knowledge of disease and its progression will improve Outcome: Progressing   Problem: Health Behavior/Discharge Planning: Goal: Ability to manage health-related needs will improve Outcome: Progressing   Problem: Clinical Measurements: Goal: Complications related to the disease process or treatment will be avoided or minimized Outcome: Progressing Goal: Dialysis access will remain free of complications Outcome: Progressing   Problem: Activity: Goal: Activity intolerance will improve Outcome: Progressing   Problem: Fluid Volume: Goal: Fluid volume balance will be maintained or improved Outcome: Progressing   Problem: Nutritional: Goal: Ability to make appropriate dietary choices will improve Outcome: Progressing   Problem: Respiratory: Goal: Respiratory symptoms related to disease process will be avoided Outcome: Progressing   Problem: Self-Concept: Goal: Body image disturbance will be avoided or minimized Outcome: Progressing   Problem: Urinary Elimination: Goal: Progression of disease will be identified and treated Outcome: Progressing   Problem: Education: Goal: Knowledge of General Education information will improve Description: Including pain rating scale, medication(s)/side effects and non-pharmacologic comfort measures Outcome: Progressing   Problem: Health Behavior/Discharge Planning: Goal: Ability to manage health-related needs will improve Outcome: Progressing   Problem: Clinical Measurements: Goal: Ability to maintain clinical measurements within normal limits will improve Outcome: Progressing Goal: Will remain free from infection Outcome: Progressing Goal: Diagnostic test results will improve Outcome: Progressing Goal: Respiratory complications will improve Outcome: Progressing Goal: Cardiovascular complication will be avoided Outcome: Progressing   Problem: Activity: Goal: Risk for activity intolerance will  decrease Outcome: Progressing   Problem: Nutrition: Goal: Adequate nutrition will be maintained Outcome: Progressing   Problem: Coping: Goal: Level of anxiety will decrease Outcome: Progressing   Problem: Elimination: Goal: Will not experience complications related to bowel motility Outcome: Progressing Goal: Will not experience complications related to urinary retention Outcome: Progressing   Problem: Pain Managment: Goal: General experience of comfort will improve Outcome: Progressing   Problem: Safety: Goal: Ability to remain free from injury will improve Outcome: Progressing   Problem: Skin Integrity: Goal: Risk for impaired skin integrity will decrease Outcome: Progressing

## 2020-07-30 NOTE — Progress Notes (Signed)
  [facility] Progress Note   79 y.o.female HTN, HLD, HCV, COPD, rt nephrectomy in 2018 for RCC p/w  nausea and vomiting and AKI on CKD  Assessment/ Plan:   1. Severe oliguric AKI on CKD3: S/p rt nephrectomy BL Cr 1.3-1.6 range in recent months presumably chronic component secondary to HTN.  No obvious obstruction.  Poor response to Lasix.  Not a biopsy candidate given solitary kidney.  Initiated dialysis on 8/14.  We will continue dialysis for now and follow-up serologies -TDC in place -Dialysis 3/3 today -Consider AVF/AVG placement but holding given potential resolving AKI - Avoid contrast and other nephrotoxins if possible. - Renal dose medications for a GFR under 15 ml/min. -f/u serologies - Strict I&O's, daily weights   2. Hyperkalemia -secondary to renal failure.  Now improved with Lokelma and dialysis.  3. Anemia  -Iron sat 14 and ferritin 59.  Continue iron load 1000mg  over 4 doses  4. Dyspnea with h/o COPD + b/l pleural effusions - stable 5. HTN -acceptable continue to monitor 6. Remote h/o HCV  Subjective:   Patient feels well today and denies any specific complaints.  Edema is improving with dialysis.   Objective:   BP (!) 171/96 (BP Location: Right Wrist)   Pulse 78   Temp 98.7 F (37.1 C) (Oral)   Resp 16   Ht 5\' 2"  (1.575 m)   Wt 74.4 kg   SpO2 96%   BMI 30.00 kg/m   Intake/Output Summary (Last 24 hours) at 07/30/2020 1054 Last data filed at 07/29/2020 1800 Gross per 24 hour  Intake 105.54 ml  Output 1000 ml  Net -894.46 ml   Weight change:   Physical Exam: GEN: NAD, lying in bed HEENT: No conjunctival pallor, no nasal discharge LUNGS: Bilateral chest rise with no increased work of breathing CV: Normal rate, no audible murmurs ABD: SNDNT +BS  EXT: Tr  lower extremity edema, 1+ LUE edema  Imaging: No results found.  Labs: BMET Recent Labs  Lab 07/26/20 1747 07/27/20 0115 07/27/20 0734 07/28/20 0246 07/28/20 0355  07/29/20 1137  NA 139 137 138 138  --  140  K 6.6* 7.2* 5.6* 4.7  --  3.9  CL 110 111 110 106  --  105  CO2 13* 12* 14* 16*  --  24  GLUCOSE 113* 112* 146* 103*  --  94  BUN 115* 127* 128* 129*  --  67*  CREATININE 12.06* 12.34* 12.33* 12.35*  --  7.14*  CALCIUM 9.2 8.6* 8.4* 8.1*  --  8.1*  PHOS  --   --  7.4*  --  8.1* 6.3*   CBC Recent Labs  Lab 07/26/20 1747 07/27/20 0734 07/28/20 0246 07/29/20 1137  WBC 12.8* 13.2* 10.1 9.3  NEUTROABS 11.4* 11.9* 7.8*  --   HGB 13.4 12.4 10.7* 10.5*  HCT 43.5 38.9 32.4* 32.9*  MCV 93.8 91.3 90.5 92.9  PLT 242 218 171 131*    Medications:    . amLODipine  10 mg Oral Daily  . Chlorhexidine Gluconate Cloth  6 each Topical Q0600  . heparin  5,000 Units Subcutaneous Q8H  . metoprolol succinate  100 mg Oral Daily  . multivitamin with minerals  1 tablet Oral Daily  . rosuvastatin  10 mg Oral Daily  . sodium zirconium cyclosilicate  10 g Oral BID      Reesa Chew  07/30/2020, 10:54 AM

## 2020-07-31 LAB — RENAL FUNCTION PANEL
Albumin: 2.3 g/dL — ABNORMAL LOW (ref 3.5–5.0)
Anion gap: 9 (ref 5–15)
BUN: 37 mg/dL — ABNORMAL HIGH (ref 8–23)
CO2: 27 mmol/L (ref 22–32)
Calcium: 8.4 mg/dL — ABNORMAL LOW (ref 8.9–10.3)
Chloride: 103 mmol/L (ref 98–111)
Creatinine, Ser: 3.83 mg/dL — ABNORMAL HIGH (ref 0.44–1.00)
GFR calc Af Amer: 12 mL/min — ABNORMAL LOW (ref >60)
GFR calc non Af Amer: 11 mL/min — ABNORMAL LOW (ref >60)
Glucose, Bld: 92 mg/dL (ref 70–99)
Phosphorus: 3.6 mg/dL (ref 2.5–4.6)
Potassium: 3.7 mmol/L (ref 3.5–5.1)
Sodium: 139 mmol/L (ref 135–145)

## 2020-07-31 LAB — CBC
HCT: 32.2 % — ABNORMAL LOW (ref 36.0–46.0)
Hemoglobin: 10.1 g/dL — ABNORMAL LOW (ref 12.0–15.0)
MCH: 30.1 pg (ref 26.0–34.0)
MCHC: 31.4 g/dL (ref 30.0–36.0)
MCV: 96.1 fL (ref 80.0–100.0)
Platelets: 108 10*3/uL — ABNORMAL LOW (ref 150–400)
RBC: 3.35 MIL/uL — ABNORMAL LOW (ref 3.87–5.11)
RDW: 16.6 % — ABNORMAL HIGH (ref 11.5–15.5)
WBC: 11.7 10*3/uL — ABNORMAL HIGH (ref 4.0–10.5)
nRBC: 0 % (ref 0.0–0.2)

## 2020-07-31 MED ORDER — LIDOCAINE HCL (PF) 1 % IJ SOLN: 5.0000 mL | INTRAMUSCULAR | Status: DC | PRN

## 2020-07-31 MED ORDER — ALTEPLASE 2 MG IJ SOLR
2.0000 mg | Freq: Once | INTRAMUSCULAR | Status: DC | PRN
  Filled 2020-07-31: qty 2

## 2020-07-31 MED ORDER — HYDRALAZINE HCL 50 MG PO TABS
50.0000 mg | ORAL_TABLET | Freq: Three times a day (TID) | ORAL | Status: DC
  Administered 2020-07-31 – 2020-08-01 (×2): 50 mg via ORAL
  Filled 2020-07-31 (×2): qty 1

## 2020-07-31 MED ORDER — LIDOCAINE-PRILOCAINE 2.5-2.5 % EX CREA
1.0000 "application " | TOPICAL_CREAM | CUTANEOUS | Status: DC | PRN
  Filled 2020-07-31: qty 5

## 2020-07-31 MED ORDER — PENTAFLUOROPROP-TETRAFLUOROETH EX AERO
1.0000 "application " | INHALATION_SPRAY | CUTANEOUS | Status: DC | PRN
  Filled 2020-07-31: qty 116

## 2020-07-31 MED ORDER — HEPARIN SODIUM (PORCINE) 1000 UNIT/ML IJ SOLN
INTRAMUSCULAR | Status: AC
  Administered 2020-07-31: 3200 [IU]
  Filled 2020-07-31: qty 4

## 2020-07-31 NOTE — Progress Notes (Addendum)
  [facility] Progress Note   79 y.o.female HTN, HLD, HCV, COPD, rt nephrectomy in 2018 for RCC p/w  nausea and vomiting and AKI on CKD  Assessment/ Plan:   1. Severe oliguric AKI on CKD3: S/p rt nephrectomy BL Cr 1.3-1.6 range in recent months presumably chronic component secondary to HTN.  No obvious obstruction.  Poor response to Lasix.  Not a biopsy candidate given solitary kidney.  Initiated dialysis on 8/14.  We will continue dialysis for now and follow-up serologies -TDC in place -Continue with dialysis today maintaining MWF schedule -Consider AVF/AVG placement but holding given potential resolving AKI - Avoid contrast and other nephrotoxins if possible. - Renal dose medications for a GFR under 15 ml/min. -ANA positive (no titer), C3 mildly decreased, otherwise normal serologies - Strict I&O's, daily weights   2. Anemia  -Iron sat 14 and ferritin 59.  Continue iron load 1000mg  over 4 doses  3. Dyspnea with h/o COPD + b/l pleural effusions -has improved with dialysis 4. HTN -more hypertensive recently likely related to volume overload.  Continue metoprolol 100 mg daily, amlodipine 10 mg daily, hydralazine increased to 50 mg 3 times daily 5. Remote h/o HCV  Subjective:   Patient did not have dialysis yesterday.  Edema worse in her bilateral arms.  Otherwise no complaints   Objective:   BP (!) 188/79   Pulse 93   Temp (!) 97.5 F (36.4 C) (Oral)   Resp (!) 22   Ht 5\' 2"  (1.575 m)   Wt 74.4 kg   SpO2 93%   BMI 30.00 kg/m  No intake or output data in the 24 hours ending 07/31/20 1045 Weight change:   Physical Exam: GEN: NAD, sitting in bed HEENT: No conjunctival pallor, no nasal discharge LUNGS: Bilateral chest rise with no increased work of breathing CV: Normal rate, no audible murmurs ABD: SNDNT +BS  EXT: Tr  lower extremity edema, 1+ LUE edema  Imaging: No results found.  Labs: BMET Recent Labs  Lab 07/26/20 1747 07/27/20 0115  07/27/20 0734 07/28/20 0246 07/28/20 0355 07/29/20 1137  NA 139 137 138 138  --  140  K 6.6* 7.2* 5.6* 4.7  --  3.9  CL 110 111 110 106  --  105  CO2 13* 12* 14* 16*  --  24  GLUCOSE 113* 112* 146* 103*  --  94  BUN 115* 127* 128* 129*  --  67*  CREATININE 12.06* 12.34* 12.33* 12.35*  --  7.14*  CALCIUM 9.2 8.6* 8.4* 8.1*  --  8.1*  PHOS  --   --  7.4*  --  8.1* 6.3*   CBC Recent Labs  Lab 07/26/20 1747 07/27/20 0734 07/28/20 0246 07/29/20 1137  WBC 12.8* 13.2* 10.1 9.3  NEUTROABS 11.4* 11.9* 7.8*  --   HGB 13.4 12.4 10.7* 10.5*  HCT 43.5 38.9 32.4* 32.9*  MCV 93.8 91.3 90.5 92.9  PLT 242 218 171 131*    Medications:    . amLODipine  10 mg Oral Daily  . Chlorhexidine Gluconate Cloth  6 each Topical Q0600  . heparin  5,000 Units Subcutaneous Q8H  . hydrALAZINE  50 mg Oral Q8H  . metoprolol succinate  100 mg Oral Daily  . multivitamin with minerals  1 tablet Oral Daily  . rosuvastatin  10 mg Oral Daily      Reesa Chew  07/31/2020, 10:45 AM

## 2020-07-31 NOTE — Progress Notes (Signed)
[facility]  PROGRESS NOTE  Cassandra Dyer ZLD:[id number] DOB: [birth year] DOA: 07/26/2020 PCP: Biagio Borg, MD Admit date - 07/26/2020   Admitting Physician Vernelle Emerald, MD  Outpatient Primary MD for the patient is Biagio Borg, MD  LOS - 4 Brief Narrative   Cassandra Dyer is a 79 y.o. year old female with medical history significant for HTN, HLD, CKD stage III, hepatitis C, chronic anemia, COPD, right nephrectomy in 2018 for renal cell cancer who presented on 07/26/2020 with shortness of breath, vomiting, diarrhea and generalized weakness for the past week, as well as intermittent episodes of lower extremity swelling was found to have creatinine of 12 (previous baseline of 1.3), potassium of 7.2, CO2 12, GFR 3, white count 12.8 concerning for AKI on CKD  Hospital course complicated by initiation of dialysis on 8/14  Subjective  Today states she still making urine and having bowel movements.  Denies any chest pain or shortness of breath  A & P   Severe oliguric AKI on CKD stage III in setting of prior remote right nephrectomy.  Unclear progression of CKD likely due to HTN acute exacerbation related to preceding symptoms of nausea, vomiting, diarrhea.  Suspect the symptoms were likely uremic related.  Renal ultrasound showed no obvious obstruction, has not responded well to Lasix, not a biopsy candidate given solitary kidney per nephrology.  ANA positive, C3 mildly decreased otherwise normal serologies -Continue dialysis via [facility], Monday Wednesday Friday -Considering aVF/AVG per nephrology -Avoid nephrotoxins, renally dose medications for GFR less than 15 -Daily weights, strict I's and O's  Hyperkalemia, resolved.  With calcium gluconate, Lokelma, insulin, bicarb on admission.  Normocytic anemia, hemoglobin stable.  Iron panel consistent with iron deficiency -Continue iron load  Dyspnea, improving.  Has COPD but no active wheezing.  Has improved with dialysis, likely due to  volume overload.  All pleural effusions noted on admitting chest x-ray, no current O2 requirements -Monitor respiratory status  HTN, SBP in the 180s likely being driven by volume overload in the setting of poor kidney function. -Increase hydralazine to 50 mg 3 times daily -Continue metoprolol 100 mg daily, Lotensin 10 mg daily -Discontinued home ARB due to AKI  Leukocytosis, mild.  Likely stress leukocytosis.  Remains afebrile -Monitor CBC  HLD, stable -Continue Crestor  Acute diarrhea, present on admission, resolved.  Per patient ongoing 3 to 4 days prior to presentation, potential nidus of AKI  History of COPD, no wheezing on exam -As needed albuterol inhaler     Family Communication  : None  Code Status : DNR, discussed on day of admission  Disposition Plan  :  Patient is from home. Anticipated d/c date: 2 to 3 days. Barriers to d/c or necessity for inpatient status:  Continue monitoring of creatinine/kidney function, dialysis needs, volume status Consults  : Nephrology, IR  Procedures  :    DVT Prophylaxis  : Heparin  Lab Results  Component Value Date   PLT 108 (L) 07/31/2020    Diet :  Diet Order            Diet renal with fluid restriction Room service appropriate? Yes; Fluid consistency: Thin  Diet effective now                  Inpatient Medications Scheduled Meds: . amLODipine  10 mg Oral Daily  . Chlorhexidine Gluconate Cloth  6 each Topical Q0600  . heparin  5,000 Units Subcutaneous Q8H  . hydrALAZINE  50  mg Oral Q8H  . metoprolol succinate  100 mg Oral Daily  . multivitamin with minerals  1 tablet Oral Daily  . rosuvastatin  10 mg Oral Daily   Continuous Infusions: . ferric gluconate (FERRLECIT/NULECIT) IV 250 mg (07/31/20 1043)   PRN Meds:.acetaminophen **OR** acetaminophen, hydrALAZINE, ondansetron **OR** ondansetron (ZOFRAN) IV, polyethylene glycol  Antibiotics  :   Anti-infectives (From admission, onward)   Start     Dose/Rate Route  Frequency Ordered Stop   07/27/20 1253  ceFAZolin (ANCEF) IVPB 2g/100 mL premix        over 30 Minutes Intravenous Continuous PRN 07/27/20 1253 07/27/20 1253   07/27/20 1244  ceFAZolin (ANCEF) 2-4 GM/100ML-% IVPB       Note to Pharmacy: Margaretmary Dys   : cabinet override      07/27/20 1244 07/28/20 0044       Objective   Vitals:   07/31/20 1430 07/31/20 1500 07/31/20 1531 07/31/20 1600  BP: (!) 180/79 (!) 151/78 (!) 177/89 (!) 158/69  Pulse: 75 75 80 83  Resp:   (!) 21 20  Temp:   99 F (37.2 C) 99 F (37.2 C)  TempSrc:   Oral Oral  SpO2: 99% 99% 99% 99%  Weight:   73.8 kg   Height:        SpO2: 99 % O2 Flow Rate (L/min): 2 L/min  Wt Readings from Last 3 Encounters:  07/31/20 73.8 kg  05/23/20 64.4 kg  04/26/20 66.7 kg     Intake/Output Summary (Last 24 hours) at 07/31/2020 1848 Last data filed at 07/31/2020 1531 Gross per 24 hour  Intake 250 ml  Output 3000 ml  Net -2750 ml    Physical Exam:     Awake Alert, Oriented X 3, Normal affect No new F.N deficits,  [place].AT, Normal respiratory effort on room air, CTAB Dialysis catheter in right chest wall, clean, dry Diffuse nonpitting edema of bilateral upper and lower extremities RRR,No Gallops,Rubs or new Murmurs,  +ve B.Sounds, Abd Soft, No tenderness, No rebound, guarding or rigidity.    I have personally reviewed the following:   Data Reviewed:  CBC Recent Labs  Lab 07/26/20 1747 07/27/20 0734 07/28/20 0246 07/29/20 1137 07/31/20 1200  WBC 12.8* 13.2* 10.1 9.3 11.7*  HGB 13.4 12.4 10.7* 10.5* 10.1*  HCT 43.5 38.9 32.4* 32.9* 32.2*  PLT 242 218 171 131* 108*  MCV 93.8 91.3 90.5 92.9 96.1  MCH 28.9 29.1 29.9 29.7 30.1  MCHC 30.8 31.9 33.0 31.9 31.4  RDW 17.2* 16.9* 16.9* 16.9* 16.6*  LYMPHSABS 0.6* 0.5* 0.9  --   --   MONOABS 0.7 0.7 1.4*  --   --   EOSABS 0.0 0.0 0.1  --   --   BASOSABS 0.0 0.0 0.0  --   --     Chemistries  Recent Labs  Lab 07/26/20 1747 07/26/20 1747 07/27/20 0115  07/27/20 0734 07/28/20 0246 07/29/20 1137 07/31/20 1200  NA 139   < > 137 138 138 140 139  K 6.6*   < > 7.2* 5.6* 4.7 3.9 3.7  CL 110   < > 111 110 106 105 103  CO2 13*   < > 12* 14* 16* 24 27  GLUCOSE 113*   < > 112* 146* 103* 94 92  BUN 115*   < > 127* 128* 129* 67* 37*  CREATININE 12.06*   < > 12.34* 12.33* 12.35* 7.14* 3.83*  CALCIUM 9.2   < > 8.6* 8.4* 8.1* 8.1* 8.4*  MG  --   --  2.5*  --  2.4  --   --   AST 33  --   --   --  26  --   --   ALT 32  --   --   --  20  --   --   ALKPHOS 46  --   --   --  32*  --   --   BILITOT 1.1  --   --   --  0.6  --   --    < > = values in this interval not displayed.   ------------------------------------------------------------------------------------------------------------------ No results for input(s): CHOL, HDL, LDLCALC, TRIG, CHOLHDL, LDLDIRECT in the last 72 hours.  Lab Results  Component Value Date   HGBA1C 5.6 04/08/2020   ------------------------------------------------------------------------------------------------------------------ No results for input(s): TSH, T4TOTAL, T3FREE, THYROIDAB in the last 72 hours.  Invalid input(s): FREET3 ------------------------------------------------------------------------------------------------------------------ Recent Labs    07/29/20 0036  FERRITIN 59  TIBC 224*  IRON 32    Coagulation profile Recent Labs  Lab 07/27/20 0734  INR 1.3*    No results for input(s): DDIMER in the last 72 hours.  Cardiac Enzymes No results for input(s): CKMB, TROPONINI, MYOGLOBIN in the last 168 hours.  Invalid input(s): CK ------------------------------------------------------------------------------------------------------------------ No results found for: BNP  Micro Results Recent Results (from the past 240 hour(s))  SARS Coronavirus 2 by RT PCR (hospital order, performed in [facility] hospital lab) Nasopharyngeal Nasopharyngeal Swab     Status: None   Collection Time: 07/26/20  5:31 PM    Specimen: Nasopharyngeal Swab  Result Value Ref Range Status   SARS Coronavirus 2 NEGATIVE NEGATIVE Final    Comment: (NOTE) SARS-CoV-2 target nucleic acids are NOT DETECTED.  The SARS-CoV-2 RNA is generally detectable in upper and lower respiratory specimens during the acute phase of infection. The lowest concentration of SARS-CoV-2 viral copies this assay can detect is 250 copies / mL. A negative result does not preclude SARS-CoV-2 infection and should not be used as the sole basis for treatment or other patient management decisions.  A negative result may occur with improper specimen collection / handling, submission of specimen other than nasopharyngeal swab, presence of viral mutation(s) within the areas targeted by this assay, and inadequate number of viral copies (<250 copies / mL). A negative result must be combined with clinical observations, patient history, and epidemiological information.  Fact Sheet for Patients:   StrictlyIdeas.no  Fact Sheet for Healthcare Providers: BankingDealers.co.za  This test is not yet approved or  cleared by the [place] FDA and has been authorized for detection and/or diagnosis of SARS-CoV-2 by FDA under an Emergency Use Authorization (EUA).  This EUA will remain in effect (meaning this test can be used) for the duration of the COVID-19 declaration under Section 564(b)(1) of the Act, 21 U.S.C. section 360bbb-3(b)(1), unless the authorization is terminated or revoked sooner.  Performed at [facility], [place] [address], [place], [place] [address]   MRSA PCR Screening     Status: None   Collection Time: 07/27/20  8:25 AM   Specimen: Nasal Mucosa; Nasopharyngeal  Result Value Ref Range Status   MRSA by PCR NEGATIVE NEGATIVE Final    Comment:        The GeneXpert MRSA Assay (FDA approved for NASAL specimens only), is one component of a comprehensive MRSA  colonization surveillance program. It is not intended to diagnose MRSA infection nor to guide or monitor treatment for MRSA infections. Performed  at [facility], [place] [address] [place], [place] [address]   Gastrointestinal Panel by PCR , Stool     Status: None   Collection Time: 07/27/20 10:00 AM   Specimen: Stool  Result Value Ref Range Status   Campylobacter species NOT DETECTED NOT DETECTED Final   Plesimonas shigelloides NOT DETECTED NOT DETECTED Final   Salmonella species NOT DETECTED NOT DETECTED Final   Yersinia enterocolitica NOT DETECTED NOT DETECTED Final   Vibrio species NOT DETECTED NOT DETECTED Final   Vibrio cholerae NOT DETECTED NOT DETECTED Final   Enteroaggregative E coli (EAEC) NOT DETECTED NOT DETECTED Final   Enteropathogenic E coli (EPEC) NOT DETECTED NOT DETECTED Final   Enterotoxigenic E coli (ETEC) NOT DETECTED NOT DETECTED Final   Shiga like toxin producing E coli (STEC) NOT DETECTED NOT DETECTED Final   Shigella/Enteroinvasive E coli (EIEC) NOT DETECTED NOT DETECTED Final   Cryptosporidium NOT DETECTED NOT DETECTED Final   Cyclospora cayetanensis NOT DETECTED NOT DETECTED Final   Entamoeba histolytica NOT DETECTED NOT DETECTED Final   Giardia lamblia NOT DETECTED NOT DETECTED Final   Adenovirus F40/41 NOT DETECTED NOT DETECTED Final   Astrovirus NOT DETECTED NOT DETECTED Final   Norovirus GI/GII NOT DETECTED NOT DETECTED Final   Rotavirus A NOT DETECTED NOT DETECTED Final   Sapovirus (I, II, IV, and V) NOT DETECTED NOT DETECTED Final    Comment: Performed at [facility], [address], [place],  [address]    Radiology Reports DG Chest 1 View  Result Date: 07/26/2020 CLINICAL DATA:  79 year old female with shortness of breath. EXAM: CHEST  1 VIEW COMPARISON:  Chest radiograph dated 04/26/2020 FINDINGS: Small bilateral pleural effusions with bibasilar atelectasis, increased since the prior radiograph. Pneumonia is not excluded.  No pneumothorax. The cardiac borders are silhouetted. Atherosclerotic calcification of the aorta. No acute osseous pathology. IMPRESSION: Small bilateral pleural effusions with bibasilar atelectasis, increased since the prior radiograph. Electronically Signed   By: Anner Crete M.D.   On: 07/26/2020 20:24   US RENAL  Result Date: 07/27/2020 CLINICAL DATA:  Renal failure. EXAM: RENAL / URINARY TRACT ULTRASOUND COMPLETE COMPARISON:  CT 04/08/2020 FINDINGS: Right Kidney: Surgically absent. Left Kidney: Renal measurements: 9.6 x 5.4 x 4.8 cm = volume: 130 mL. Echogenicity within normal limits. No mass or hydronephrosis visualized. Bladder: Empty and not well assessed. Other: Incidental right pleural effusion. IMPRESSION: 1. Unremarkable sonographic appearance of the left kidney. 2. Right nephrectomy. 3. Urinary bladder is empty and not well assessed. 4. Right pleural effusion incidentally noted. Electronically Signed   By: Keith Rake M.D.   On: 07/27/2020 04:04   IR Fluoro Guide CV Line Right  Result Date: 07/27/2020 CLINICAL DATA:  Acute kidney injury, uremia and need for emergent hemodialysis. Request has been made to place a tunneled hemodialysis catheter due to predicted need for longer term hemodialysis. EXAM: TUNNELED CENTRAL VENOUS HEMODIALYSIS CATHETER PLACEMENT WITH ULTRASOUND AND FLUOROSCOPIC GUIDANCE ANESTHESIA/SEDATION: 2.0 mg IV Versed; 50 mcg IV Fentanyl. Total Moderate Sedation Time:   20 minutes. The patient's level of consciousness and physiologic status were continuously monitored during the procedure by Radiology nursing. MEDICATIONS: 2 g IV Ancef. FLUOROSCOPY TIME:  24 seconds.  2.0 mGy. PROCEDURE: The procedure, risks, benefits, and alternatives were explained to the patient. Questions regarding the procedure were encouraged and answered. The patient understands and consents to the procedure. A timeout was performed prior to initiating the procedure. The right neck and chest were  prepped with chlorhexidine  in a sterile fashion, and a sterile drape was applied covering the operative field. Maximum barrier sterile technique with sterile gowns and gloves were used for the procedure. Local anesthesia was provided with 1% lidocaine. Ultrasound was used to confirm patency of the right internal jugular vein. After creating a small venotomy incision, a 21 gauge needle was advanced into the right internal jugular vein under direct, real-time ultrasound guidance. Ultrasound image documentation was performed. After securing guidewire access, an 8 Fr dilator was placed. A J-wire was kinked to measure appropriate catheter length. A Palindrome tunneled hemodialysis catheter measuring 19 cm from tip to cuff was chosen for placement. This was tunneled in a retrograde fashion from the chest wall to the venotomy incision. At the venotomy, serial dilatation was performed and a 15 Fr peel-away sheath was placed over a guidewire. The catheter was then placed through the sheath and the sheath removed. Final catheter positioning was confirmed and documented with a fluoroscopic spot image. The catheter was aspirated, flushed with saline, and injected with appropriate volume heparin dwells. The venotomy incision was closed with subcuticular 4-0 Vicryl. Dermabond was applied to the incision. The catheter exit site was secured with 0-Prolene retention sutures. COMPLICATIONS: None.  No pneumothorax. FINDINGS: After catheter placement, the tip lies in the right atrium. The catheter aspirates normally and is ready for immediate use. IMPRESSION: Placement of tunneled hemodialysis catheter via the right internal jugular vein. The catheter tip lies in the right atrium. The catheter is ready for immediate use. Electronically Signed   By: Aletta Edouard M.D.   On: 07/27/2020 15:00   IR US Guide Vasc Access Right  Result Date: 07/27/2020 CLINICAL DATA:  Acute kidney injury, uremia and need for emergent hemodialysis.  Request has been made to place a tunneled hemodialysis catheter due to predicted need for longer term hemodialysis. EXAM: TUNNELED CENTRAL VENOUS HEMODIALYSIS CATHETER PLACEMENT WITH ULTRASOUND AND FLUOROSCOPIC GUIDANCE ANESTHESIA/SEDATION: 2.0 mg IV Versed; 50 mcg IV Fentanyl. Total Moderate Sedation Time:   20 minutes. The patient's level of consciousness and physiologic status were continuously monitored during the procedure by Radiology nursing. MEDICATIONS: 2 g IV Ancef. FLUOROSCOPY TIME:  24 seconds.  2.0 mGy. PROCEDURE: The procedure, risks, benefits, and alternatives were explained to the patient. Questions regarding the procedure were encouraged and answered. The patient understands and consents to the procedure. A timeout was performed prior to initiating the procedure. The right neck and chest were prepped with chlorhexidine in a sterile fashion, and a sterile drape was applied covering the operative field. Maximum barrier sterile technique with sterile gowns and gloves were used for the procedure. Local anesthesia was provided with 1% lidocaine. Ultrasound was used to confirm patency of the right internal jugular vein. After creating a small venotomy incision, a 21 gauge needle was advanced into the right internal jugular vein under direct, real-time ultrasound guidance. Ultrasound image documentation was performed. After securing guidewire access, an 8 Fr dilator was placed. A J-wire was kinked to measure appropriate catheter length. A Palindrome tunneled hemodialysis catheter measuring 19 cm from tip to cuff was chosen for placement. This was tunneled in a retrograde fashion from the chest wall to the venotomy incision. At the venotomy, serial dilatation was performed and a 15 Fr peel-away sheath was placed over a guidewire. The catheter was then placed through the sheath and the sheath removed. Final catheter positioning was confirmed and documented with a fluoroscopic spot image. The catheter was  aspirated, flushed with saline, and injected with appropriate  volume heparin dwells. The venotomy incision was closed with subcuticular 4-0 Vicryl. Dermabond was applied to the incision. The catheter exit site was secured with 0-Prolene retention sutures. COMPLICATIONS: None.  No pneumothorax. FINDINGS: After catheter placement, the tip lies in the right atrium. The catheter aspirates normally and is ready for immediate use. IMPRESSION: Placement of tunneled hemodialysis catheter via the right internal jugular vein. The catheter tip lies in the right atrium. The catheter is ready for immediate use. Electronically Signed   By: Aletta Edouard M.D.   On: 07/27/2020 15:00     Time Spent in minutes  30     Desiree Hane M.D on 07/31/2020 at 6:48 PM  To page go to [email or web address] - password [facility]

## 2020-08-01 LAB — CBC WITH DIFFERENTIAL/PLATELET
Abs Immature Granulocytes: 0.07 10*3/uL (ref 0.00–0.07)
Basophils Absolute: 0 10*3/uL (ref 0.0–0.1)
Basophils Relative: 0 %
Eosinophils Absolute: 0.2 10*3/uL (ref 0.0–0.5)
Eosinophils Relative: 2 %
HCT: 31.7 % — ABNORMAL LOW (ref 36.0–46.0)
Hemoglobin: 9.8 g/dL — ABNORMAL LOW (ref 12.0–15.0)
Immature Granulocytes: 1 %
Lymphocytes Relative: 7 %
Lymphs Abs: 0.8 10*3/uL (ref 0.7–4.0)
MCH: 30.2 pg (ref 26.0–34.0)
MCHC: 30.9 g/dL (ref 30.0–36.0)
MCV: 97.5 fL (ref 80.0–100.0)
Monocytes Absolute: 1.6 10*3/uL — ABNORMAL HIGH (ref 0.1–1.0)
Monocytes Relative: 14 %
Neutro Abs: 9.3 10*3/uL — ABNORMAL HIGH (ref 1.7–7.7)
Neutrophils Relative %: 76 %
Platelets: 107 10*3/uL — ABNORMAL LOW (ref 150–400)
RBC: 3.25 MIL/uL — ABNORMAL LOW (ref 3.87–5.11)
RDW: 16.8 % — ABNORMAL HIGH (ref 11.5–15.5)
WBC: 12 10*3/uL — ABNORMAL HIGH (ref 4.0–10.5)
nRBC: 0 % (ref 0.0–0.2)

## 2020-08-01 MED ORDER — HYDRALAZINE HCL 50 MG PO TABS
100.0000 mg | ORAL_TABLET | Freq: Three times a day (TID) | ORAL | Status: DC
  Administered 2020-08-01 – 2020-08-08 (×20): 100 mg via ORAL
  Filled 2020-08-01 (×20): qty 2

## 2020-08-01 MED ORDER — CARVEDILOL 12.5 MG PO TABS
12.5000 mg | ORAL_TABLET | Freq: Two times a day (BID) | ORAL | Status: DC
  Administered 2020-08-01 – 2020-08-08 (×14): 12.5 mg via ORAL
  Filled 2020-08-01 (×14): qty 1

## 2020-08-01 NOTE — Progress Notes (Signed)
Cassandra Dyer Progress Note   79 y.o.female HTN, HLD, HCV, COPD, rt nephrectomy in 2018 for RCC p/w  nausea and vomiting and AKI on CKD  Assessment/ Plan:   1. Severe oliguric AKI on CKD3: S/p rt nephrectomy BL Cr 1.3-1.6 range in recent months presumably chronic component secondary to HTN.  No obvious obstruction.  Poor response to Lasix.  Not a biopsy candidate given solitary kidney.  Initiated dialysis on 8/14.  We will continue dialysis for now and follow-up serologies -TDC in place -Continue with dialysis today maintaining MWF schedule -Consider AVF/AVG placement but holding given potential resolving AKI - Avoid contrast and other nephrotoxins if possible. - Renal dose medications for a GFR under 15 ml/min. -ANA positive (no titer), C3 mildly decreased, otherwise normal serologies.  Follow-up reflex from ANA - Strict I&O's, daily weights   2. Anemia  -Iron sat 14 and ferritin 59.  Continue iron load 1000mg  over 4 doses, last dose today  3. Dyspnea with h/o COPD + b/l pleural effusions -has improved with dialysis 4. HTN -persistent hypertension despite 3 L of ultrafiltration yesterday.  Increase hydralazine 200 mg 3 times daily and switch metoprolol 100 mg to carvedilol 12.5 mg twice daily.  Continue amlodipine 10 mg daily 5. Remote h/o HCV  Subjective:   Tolerated dialysis yesterday without significant issues.  3 L of ultrafiltration.  Edema somewhat better   Objective:   BP (!) 150/54 (BP Location: Left Wrist)   Pulse 90   Temp 100.1 F (37.8 C) (Oral)   Resp 20   Ht 5\' 2"  (1.575 m)   Wt 73.8 kg   SpO2 95%   BMI 29.76 kg/m   Intake/Output Summary (Last 24 hours) at 08/01/2020 0935 Last data filed at 07/31/2020 1531 Gross per 24 hour  Intake --  Output 3000 ml  Net -3000 ml   Weight change:   Physical Exam: GEN: NAD, sitting in bed HEENT: No conjunctival pallor, no nasal discharge LUNGS: Bilateral chest rise with no increased work of  breathing CV: Normal rate, no audible murmurs ABD: SNDNT +BS  EXT:  Trace bilateral lower extremity edema, trace bilateral upper extremity edema  Imaging: No results found.  Labs: BMET Recent Labs  Lab 07/26/20 1747 07/27/20 0115 07/27/20 0734 07/28/20 0246 07/28/20 0355 07/29/20 1137 07/31/20 1200  NA 139 137 138 138  --  140 139  K 6.6* 7.2* 5.6* 4.7  --  3.9 3.7  CL 110 111 110 106  --  105 103  CO2 13* 12* 14* 16*  --  24 27  GLUCOSE 113* 112* 146* 103*  --  94 92  BUN 115* 127* 128* 129*  --  67* 37*  CREATININE 12.06* 12.34* 12.33* 12.35*  --  7.14* 3.83*  CALCIUM 9.2 8.6* 8.4* 8.1*  --  8.1* 8.4*  PHOS  --   --  7.4*  --  8.1* 6.3* 3.6   CBC Recent Labs  Lab 07/26/20 1747 07/26/20 1747 07/27/20 0734 07/28/20 0246 07/29/20 1137 07/31/20 1200  WBC 12.8*   < > 13.2* 10.1 9.3 11.7*  NEUTROABS 11.4*  --  11.9* 7.8*  --   --   HGB 13.4   < > 12.4 10.7* 10.5* 10.1*  HCT 43.5   < > 38.9 32.4* 32.9* 32.2*  MCV 93.8   < > 91.3 90.5 92.9 96.1  PLT 242   < > 218 171 131* 108*   < > = values in this interval not displayed.  Medications:    . amLODipine  10 mg Oral Daily  . carvedilol  12.5 mg Oral BID WC  . Chlorhexidine Gluconate Cloth  6 each Topical Q0600  . heparin  5,000 Units Subcutaneous Q8H  . hydrALAZINE  100 mg Oral Q8H  . multivitamin with minerals  1 tablet Oral Daily  . rosuvastatin  10 mg Oral Daily      Cassandra Dyer  08/01/2020, 9:35 AM

## 2020-08-01 NOTE — Progress Notes (Signed)
Referral submitted for OP HD treatment for AKI to [facility]missions to request treatment at [facility]. Navigator will follow closely.  Alphonzo Cruise, [place] Renal Navigator [phone]

## 2020-08-01 NOTE — Progress Notes (Signed)
[facility]  PROGRESS NOTE  Cassandra Dyer GBT:[id number] DOB: 03-16-1941 DOA: 07/26/2020 PCP: Biagio Borg, MD Admit date - 07/26/2020   Admitting Physician Vernelle Emerald, MD  Outpatient Primary MD for the patient is Biagio Borg, MD  LOS - 5 Brief Narrative   Cassandra Dyer is a 79 y.o. year old female with medical history significant for HTN, HLD, CKD stage III, hepatitis C, chronic anemia, COPD, right nephrectomy in 2018 for renal cell cancer who presented on 07/26/2020 with shortness of breath, vomiting, diarrhea and generalized weakness for the past week, as well as intermittent episodes of lower extremity swelling was found to have creatinine of 12 (previous baseline of 1.3), potassium of 7.2, CO2 12, GFR 3, white count 12.8 concerning for AKI on CKD  Hospital course complicated by initiation of dialysis on 8/14  Subjective  Today no complaints this morning.  Had normal bowel movements yesterday.  Still reports making urine.  Denies any shortness of breath.  A & P   Severe oliguric AKI on CKD stage III in setting of prior remote right nephrectomy.  Unclear if this is progression of CKD likely due to HTN, possible acute exacerbation related to preceding symptoms of nausea, vomiting, diarrhea though that was only for short period of time.  Suspect the symptoms were likely uremic related.  Renal ultrasound showed no obvious obstruction, has not responded well to Lasix, not a biopsy candidate given solitary kidney per nephrology.  ANA positive, C3 mildly decreased otherwise normal serologies -Continue dialysis via [facility], Monday Wednesday Friday -Considering aVF/AVG per nephrology -Avoid nephrotoxins, renally dose medications for GFR less than 15 -Daily weights, strict I's and O's  Thrombocytopenia.  On presentation on 8/13 platelet count of 240 2K.  Had previous episodes of thrombocytopenia back in April 2021 with nadir of 70 2K.  Platelets have slowly been increasing and  currently 108K.  No signs or symptoms of bleeding -Add differential to CBC -Peripheral smear  Hyperkalemia, resolved.  With calcium gluconate, Lokelma, insulin, bicarb on admission.  Normocytic anemia, hemoglobin stable.  Iron panel consistent with iron deficiency -Continue iron load  Dyspnea, improving.  Has COPD but no active wheezing.  Has improved with dialysis, likely due to volume overload.  All pleural effusions noted on admitting chest x-ray, no current O2 requirements -Monitor respiratory status  HTN, still not well controlled with SBP's in the 160s despite due to his ultrafiltration on 8/18 and increasing hydralazine -Nephrology recommends increasing hydralazine to 20 mg 3 times daily -Discontinue metoprolol in favor of Coreg 12.5 mg twice daily -Continue amlodipine 10 mg daily -Discontinued home ARB due to AKI  Leukocytosis, mild.  Likely stress leukocytosis.  Remains afebrile -Monitor CBC  HLD, stable -Continue Crestor  Acute diarrhea, present on admission, resolved.  Per patient ongoing 3 to 4 days prior to presentation, potential nidus of AKI  History of COPD, no wheezing on exam -As needed albuterol inhaler     Family Communication  : None  Code Status : DNR, discussed on day of admission  Disposition Plan  :  Patient is from home. Anticipated d/c date: 2 to 3 days. Barriers to d/c or necessity for inpatient status:  Continue monitoring of creatinine/kidney function, dialysis needs, volume status Consults  : Nephrology, IR  Procedures  :    DVT Prophylaxis  : Heparin  Lab Results  Component Value Date   PLT 108 (L) 07/31/2020    Diet :  Diet Order  Diet renal with fluid restriction Room service appropriate? Yes; Fluid consistency: Thin  Diet effective now                  Inpatient Medications Scheduled Meds: . amLODipine  10 mg Oral Daily  . carvedilol  12.5 mg Oral BID WC  . Chlorhexidine Gluconate Cloth  6 each Topical Q0600   . heparin  5,000 Units Subcutaneous Q8H  . hydrALAZINE  100 mg Oral Q8H  . multivitamin with minerals  1 tablet Oral Daily  . rosuvastatin  10 mg Oral Daily   Continuous Infusions:  PRN Meds:.acetaminophen **OR** acetaminophen, hydrALAZINE, ondansetron **OR** ondansetron (ZOFRAN) IV, polyethylene glycol  Antibiotics  :   Anti-infectives (From admission, onward)   Start     Dose/Rate Route Frequency Ordered Stop   07/27/20 1253  ceFAZolin (ANCEF) IVPB 2g/100 mL premix        over 30 Minutes Intravenous Continuous PRN 07/27/20 1253 07/27/20 1253   07/27/20 1244  ceFAZolin (ANCEF) 2-4 GM/100ML-% IVPB       Note to Pharmacy: Margaretmary Dys   : cabinet override      07/27/20 1244 07/28/20 0044       Objective   Vitals:   07/31/20 2350 08/01/20 0345 08/01/20 0747 08/01/20 1138  BP: (!) 167/60 (!) 163/68 (!) 150/54 (!) 134/52  Pulse: 89 88 90 80  Resp: (!) 28 19 20 17   Temp: 100.1 F (37.8 C) 99.6 F (37.6 C) 100.1 F (37.8 C) 99.4 F (37.4 C)  TempSrc: Oral Oral Oral Oral  SpO2: 94% 95% 95% 95%  Weight:      Height:        SpO2: 95 % O2 Flow Rate (L/min): 2 L/min  Wt Readings from Last 3 Encounters:  07/31/20 73.8 kg  05/23/20 64.4 kg  04/26/20 66.7 kg     Intake/Output Summary (Last 24 hours) at 08/01/2020 1516 Last data filed at 08/01/2020 1326 Gross per 24 hour  Intake 320 ml  Output 3000 ml  Net -2680 ml    Physical Exam:     Awake Alert, Oriented X 3, Normal affect No new F.N deficits,  [place].AT, Normal respiratory effort on room air, CTAB Dialysis catheter in right chest wall, clean, dry Diffuse nonpitting edema of bilateral upper and lower extremities RRR,No Gallops,Rubs or new Murmurs,  +ve B.Sounds, Abd Soft, No tenderness, No rebound, guarding or rigidity.    I have personally reviewed the following:   Data Reviewed:  CBC Recent Labs  Lab 07/26/20 1747 07/27/20 0734 07/28/20 0246 07/29/20 1137 07/31/20 1200  WBC 12.8* 13.2* 10.1 9.3  11.7*  HGB 13.4 12.4 10.7* 10.5* 10.1*  HCT 43.5 38.9 32.4* 32.9* 32.2*  PLT 242 218 171 131* 108*  MCV 93.8 91.3 90.5 92.9 96.1  MCH 28.9 29.1 29.9 29.7 30.1  MCHC 30.8 31.9 33.0 31.9 31.4  RDW 17.2* 16.9* 16.9* 16.9* 16.6*  LYMPHSABS 0.6* 0.5* 0.9  --   --   MONOABS 0.7 0.7 1.4*  --   --   EOSABS 0.0 0.0 0.1  --   --   BASOSABS 0.0 0.0 0.0  --   --     Chemistries  Recent Labs  Lab 07/26/20 1747 07/26/20 1747 07/27/20 0115 07/27/20 0734 07/28/20 0246 07/29/20 1137 07/31/20 1200  NA 139   < > 137 138 138 140 139  K 6.6*   < > 7.2* 5.6* 4.7 3.9 3.7  CL 110   < > 111 110 106  105 103  CO2 13*   < > 12* 14* 16* 24 27  GLUCOSE 113*   < > 112* 146* 103* 94 92  BUN 115*   < > 127* 128* 129* 67* 37*  CREATININE 12.06*   < > 12.34* 12.33* 12.35* 7.14* 3.83*  CALCIUM 9.2   < > 8.6* 8.4* 8.1* 8.1* 8.4*  MG  --   --  2.5*  --  2.4  --   --   AST 33  --   --   --  26  --   --   ALT 32  --   --   --  20  --   --   ALKPHOS 46  --   --   --  32*  --   --   BILITOT 1.1  --   --   --  0.6  --   --    < > = values in this interval not displayed.   ------------------------------------------------------------------------------------------------------------------ No results for input(s): CHOL, HDL, LDLCALC, TRIG, CHOLHDL, LDLDIRECT in the last 72 hours.  Lab Results  Component Value Date   HGBA1C 5.6 04/08/2020   ------------------------------------------------------------------------------------------------------------------ No results for input(s): TSH, T4TOTAL, T3FREE, THYROIDAB in the last 72 hours.  Invalid input(s): FREET3 ------------------------------------------------------------------------------------------------------------------ No results for input(s): VITAMINB12, FOLATE, FERRITIN, TIBC, IRON, RETICCTPCT in the last 72 hours.  Coagulation profile Recent Labs  Lab 07/27/20 0734  INR 1.3*    No results for input(s): DDIMER in the last 72 hours.  Cardiac  Enzymes No results for input(s): CKMB, TROPONINI, MYOGLOBIN in the last 168 hours.  Invalid input(s): CK ------------------------------------------------------------------------------------------------------------------ No results found for: BNP  Micro Results Recent Results (from the past 240 hour(s))  SARS Coronavirus 2 by RT PCR (hospital order, performed in [facility] hospital lab) Nasopharyngeal Nasopharyngeal Swab     Status: None   Collection Time: 07/26/20  5:31 PM   Specimen: Nasopharyngeal Swab  Result Value Ref Range Status   SARS Coronavirus 2 NEGATIVE NEGATIVE Final    Comment: (NOTE) SARS-CoV-2 target nucleic acids are NOT DETECTED.  The SARS-CoV-2 RNA is generally detectable in upper and lower respiratory specimens during the acute phase of infection. The lowest concentration of SARS-CoV-2 viral copies this assay can detect is 250 copies / mL. A negative result does not preclude SARS-CoV-2 infection and should not be used as the sole basis for treatment or other patient management decisions.  A negative result may occur with improper specimen collection / handling, submission of specimen other than nasopharyngeal swab, presence of viral mutation(s) within the areas targeted by this assay, and inadequate number of viral copies (<250 copies / mL). A negative result must be combined with clinical observations, patient history, and epidemiological information.  Fact Sheet for Patients:   StrictlyIdeas.no  Fact Sheet for Healthcare Providers: BankingDealers.co.za  This test is not yet approved or  cleared by the [place] FDA and has been authorized for detection and/or diagnosis of SARS-CoV-2 by FDA under an Emergency Use Authorization (EUA).  This EUA will remain in effect (meaning this test can be used) for the duration of the COVID-19 declaration under Section 564(b)(1) of the Act, 21 U.S.C. section  360bbb-3(b)(1), unless the authorization is terminated or revoked sooner.  Performed at [facility], [place] [address], [place], [place] [address]   MRSA PCR Screening     Status: None   Collection Time: 07/27/20  8:25 AM   Specimen: Nasal Mucosa; Nasopharyngeal  Result Value  Ref Range Status   MRSA by PCR NEGATIVE NEGATIVE Final    Comment:        The GeneXpert MRSA Assay (FDA approved for NASAL specimens only), is one component of a comprehensive MRSA colonization surveillance program. It is not intended to diagnose MRSA infection nor to guide or monitor treatment for MRSA infections. Performed at [facility], [place] [address], [place], [place] [address]   Gastrointestinal Panel by PCR , Stool     Status: None   Collection Time: 07/27/20 10:00 AM   Specimen: Stool  Result Value Ref Range Status   Campylobacter species NOT DETECTED NOT DETECTED Final   Plesimonas shigelloides NOT DETECTED NOT DETECTED Final   Salmonella species NOT DETECTED NOT DETECTED Final   Yersinia enterocolitica NOT DETECTED NOT DETECTED Final   Vibrio species NOT DETECTED NOT DETECTED Final   Vibrio cholerae NOT DETECTED NOT DETECTED Final   Enteroaggregative E coli (EAEC) NOT DETECTED NOT DETECTED Final   Enteropathogenic E coli (EPEC) NOT DETECTED NOT DETECTED Final   Enterotoxigenic E coli (ETEC) NOT DETECTED NOT DETECTED Final   Shiga like toxin producing E coli (STEC) NOT DETECTED NOT DETECTED Final   Shigella/Enteroinvasive E coli (EIEC) NOT DETECTED NOT DETECTED Final   Cryptosporidium NOT DETECTED NOT DETECTED Final   Cyclospora cayetanensis NOT DETECTED NOT DETECTED Final   Entamoeba histolytica NOT DETECTED NOT DETECTED Final   Giardia lamblia NOT DETECTED NOT DETECTED Final   Adenovirus F40/41 NOT DETECTED NOT DETECTED Final   Astrovirus NOT DETECTED NOT DETECTED Final   Norovirus GI/GII NOT DETECTED NOT DETECTED Final   Rotavirus A NOT DETECTED NOT DETECTED Final   Sapovirus  (I, II, IV, and V) NOT DETECTED NOT DETECTED Final    Comment: Performed at [facility], [address], [place], [place] [address]    Radiology Reports DG Chest 1 View  Result Date: 07/26/2020 CLINICAL DATA:  79 year old female with shortness of breath. EXAM: CHEST  1 VIEW COMPARISON:  Chest radiograph dated 04/26/2020 FINDINGS: Small bilateral pleural effusions with bibasilar atelectasis, increased since the prior radiograph. Pneumonia is not excluded. No pneumothorax. The cardiac borders are silhouetted. Atherosclerotic calcification of the aorta. No acute osseous pathology. IMPRESSION: Small bilateral pleural effusions with bibasilar atelectasis, increased since the prior radiograph. Electronically Signed   By: Anner Crete M.D.   On: 07/26/2020 20:24   US RENAL  Result Date: 07/27/2020 CLINICAL DATA:  Renal failure. EXAM: RENAL / URINARY TRACT ULTRASOUND COMPLETE COMPARISON:  CT 04/08/2020 FINDINGS: Right Kidney: Surgically absent. Left Kidney: Renal measurements: 9.6 x 5.4 x 4.8 cm = volume: 130 mL. Echogenicity within normal limits. No mass or hydronephrosis visualized. Bladder: Empty and not well assessed. Other: Incidental right pleural effusion. IMPRESSION: 1. Unremarkable sonographic appearance of the left kidney. 2. Right nephrectomy. 3. Urinary bladder is empty and not well assessed. 4. Right pleural effusion incidentally noted. Electronically Signed   By: Keith Rake M.D.   On: 07/27/2020 04:04   IR Fluoro Guide CV Line Right  Result Date: 07/27/2020 CLINICAL DATA:  Acute kidney injury, uremia and need for emergent hemodialysis. Request has been made to place a tunneled hemodialysis catheter due to predicted need for longer term hemodialysis. EXAM: TUNNELED CENTRAL VENOUS HEMODIALYSIS CATHETER PLACEMENT WITH ULTRASOUND AND FLUOROSCOPIC GUIDANCE ANESTHESIA/SEDATION: 2.0 mg IV Versed; 50 mcg IV Fentanyl. Total Moderate Sedation Time:   20 minutes. The patient's level of  consciousness and physiologic status were continuously monitored during the procedure by Radiology  nursing. MEDICATIONS: 2 g IV Ancef. FLUOROSCOPY TIME:  24 seconds.  2.0 mGy. PROCEDURE: The procedure, risks, benefits, and alternatives were explained to the patient. Questions regarding the procedure were encouraged and answered. The patient understands and consents to the procedure. A timeout was performed prior to initiating the procedure. The right neck and chest were prepped with chlorhexidine in a sterile fashion, and a sterile drape was applied covering the operative field. Maximum barrier sterile technique with sterile gowns and gloves were used for the procedure. Local anesthesia was provided with 1% lidocaine. Ultrasound was used to confirm patency of the right internal jugular vein. After creating a small venotomy incision, a 21 gauge needle was advanced into the right internal jugular vein under direct, real-time ultrasound guidance. Ultrasound image documentation was performed. After securing guidewire access, an 8 Fr dilator was placed. A J-wire was kinked to measure appropriate catheter length. A Palindrome tunneled hemodialysis catheter measuring 19 cm from tip to cuff was chosen for placement. This was tunneled in a retrograde fashion from the chest wall to the venotomy incision. At the venotomy, serial dilatation was performed and a 15 Fr peel-away sheath was placed over a guidewire. The catheter was then placed through the sheath and the sheath removed. Final catheter positioning was confirmed and documented with a fluoroscopic spot image. The catheter was aspirated, flushed with saline, and injected with appropriate volume heparin dwells. The venotomy incision was closed with subcuticular 4-0 Vicryl. Dermabond was applied to the incision. The catheter exit site was secured with 0-Prolene retention sutures. COMPLICATIONS: None.  No pneumothorax. FINDINGS: After catheter placement, the tip lies in  the right atrium. The catheter aspirates normally and is ready for immediate use. IMPRESSION: Placement of tunneled hemodialysis catheter via the right internal jugular vein. The catheter tip lies in the right atrium. The catheter is ready for immediate use. Electronically Signed   By: Aletta Edouard M.D.   On: 07/27/2020 15:00   IR US Guide Vasc Access Right  Result Date: 07/27/2020 CLINICAL DATA:  Acute kidney injury, uremia and need for emergent hemodialysis. Request has been made to place a tunneled hemodialysis catheter due to predicted need for longer term hemodialysis. EXAM: TUNNELED CENTRAL VENOUS HEMODIALYSIS CATHETER PLACEMENT WITH ULTRASOUND AND FLUOROSCOPIC GUIDANCE ANESTHESIA/SEDATION: 2.0 mg IV Versed; 50 mcg IV Fentanyl. Total Moderate Sedation Time:   20 minutes. The patient's level of consciousness and physiologic status were continuously monitored during the procedure by Radiology nursing. MEDICATIONS: 2 g IV Ancef. FLUOROSCOPY TIME:  24 seconds.  2.0 mGy. PROCEDURE: The procedure, risks, benefits, and alternatives were explained to the patient. Questions regarding the procedure were encouraged and answered. The patient understands and consents to the procedure. A timeout was performed prior to initiating the procedure. The right neck and chest were prepped with chlorhexidine in a sterile fashion, and a sterile drape was applied covering the operative field. Maximum barrier sterile technique with sterile gowns and gloves were used for the procedure. Local anesthesia was provided with 1% lidocaine. Ultrasound was used to confirm patency of the right internal jugular vein. After creating a small venotomy incision, a 21 gauge needle was advanced into the right internal jugular vein under direct, real-time ultrasound guidance. Ultrasound image documentation was performed. After securing guidewire access, an 8 Fr dilator was placed. A J-wire was kinked to measure appropriate catheter length. A  Palindrome tunneled hemodialysis catheter measuring 19 cm from tip to cuff was chosen for placement. This was tunneled in a retrograde fashion from  the chest wall to the venotomy incision. At the venotomy, serial dilatation was performed and a 15 Fr peel-away sheath was placed over a guidewire. The catheter was then placed through the sheath and the sheath removed. Final catheter positioning was confirmed and documented with a fluoroscopic spot image. The catheter was aspirated, flushed with saline, and injected with appropriate volume heparin dwells. The venotomy incision was closed with subcuticular 4-0 Vicryl. Dermabond was applied to the incision. The catheter exit site was secured with 0-Prolene retention sutures. COMPLICATIONS: None.  No pneumothorax. FINDINGS: After catheter placement, the tip lies in the right atrium. The catheter aspirates normally and is ready for immediate use. IMPRESSION: Placement of tunneled hemodialysis catheter via the right internal jugular vein. The catheter tip lies in the right atrium. The catheter is ready for immediate use. Electronically Signed   By: Aletta Edouard M.D.   On: 07/27/2020 15:00     Time Spent in minutes  30     Desiree Hane M.D on 08/01/2020 at 3:16 PM  To page go to [email or web address] - password [facility]

## 2020-08-02 DIAGNOSIS — D696 Thrombocytopenia, unspecified: Secondary | ICD-10-CM

## 2020-08-02 DIAGNOSIS — N179 Acute kidney failure, unspecified: Secondary | ICD-10-CM

## 2020-08-02 DIAGNOSIS — D649 Anemia, unspecified: Secondary | ICD-10-CM | POA: Diagnosis present

## 2020-08-02 LAB — CBC WITH DIFFERENTIAL/PLATELET
Abs Immature Granulocytes: 0.04 10*3/uL (ref 0.00–0.07)
Basophils Absolute: 0 10*3/uL (ref 0.0–0.1)
Basophils Relative: 0 %
Eosinophils Absolute: 0.1 10*3/uL (ref 0.0–0.5)
Eosinophils Relative: 1 %
HCT: 29.4 % — ABNORMAL LOW (ref 36.0–46.0)
Hemoglobin: 9.5 g/dL — ABNORMAL LOW (ref 12.0–15.0)
Immature Granulocytes: 0 %
Lymphocytes Relative: 4 %
Lymphs Abs: 0.4 10*3/uL — ABNORMAL LOW (ref 0.7–4.0)
MCH: 30.9 pg (ref 26.0–34.0)
MCHC: 32.3 g/dL (ref 30.0–36.0)
MCV: 95.8 fL (ref 80.0–100.0)
Monocytes Absolute: 1.4 10*3/uL — ABNORMAL HIGH (ref 0.1–1.0)
Monocytes Relative: 14 %
Neutro Abs: 8.1 10*3/uL — ABNORMAL HIGH (ref 1.7–7.7)
Neutrophils Relative %: 81 %
Platelets: 141 10*3/uL — ABNORMAL LOW (ref 150–400)
RBC: 3.07 MIL/uL — ABNORMAL LOW (ref 3.87–5.11)
RDW: 15.8 % — ABNORMAL HIGH (ref 11.5–15.5)
WBC: 10.1 10*3/uL (ref 4.0–10.5)
nRBC: 0 % (ref 0.0–0.2)

## 2020-08-02 MED ORDER — HEPARIN SODIUM (PORCINE) 1000 UNIT/ML IJ SOLN
INTRAMUSCULAR | Status: AC
  Filled 2020-08-02: qty 4

## 2020-08-02 NOTE — Progress Notes (Signed)
[facility] Progress Note   79 y.o.female HTN, HLD, HCV, COPD, rt nephrectomy in 2018 for RCC p/w  nausea and vomiting and AKI on CKD  Assessment/ Plan:   1. Severe oliguric AKI on CKD3: S/p rt nephrectomy BL Cr 1.3-1.6 range in recent months presumably chronic component secondary to HTN.  No obvious obstruction.  Poor response to Lasix.  Not a biopsy candidate given solitary kidney.  Initiated dialysis on 8/14.  Serologies without obvious cause of kidney dysfunction -TDC in place -Continue with dialysis today maintaining MWF schedule -Consider AVF/AVG placement but holding given potential resolving AKI - Avoid contrast and other nephrotoxins if possible. - Renal dose medications for a GFR under 15 ml/min. - Strict I&O's, daily weights   2. Anemia  -Status post 1000 mg of iron -Globin 10 today  3. HTN -significantly improved with ultrafiltration.  Continue hydralazine 100 mg 3 times daily, carvedilol 12.5 mg twice daily, amlodipine 10 mg daily 4. Remote h/o HCV  Subjective:   Had dialysis earlier this morning and tolerated well.  To 2.6 L of ultrafiltration.  Feels that her edema is improving.  Appetite is steady.   Objective:   BP (!) 145/57 (BP Location: Right Arm)   Pulse 85   Temp 98.8 F (37.1 C) (Oral)   Resp 18   Ht 5\' 2"  (1.575 m)   Wt 73.8 kg   SpO2 92%   BMI 29.76 kg/m   Intake/Output Summary (Last 24 hours) at 08/02/2020 1045 Last data filed at 08/02/2020 0524 Gross per 24 hour  Intake 120 ml  Output 2529 ml  Net -2409 ml   Weight change:   Physical Exam: GEN: NAD, sitting in bed HEENT: No conjunctival pallor, no nasal discharge LUNGS: Bilateral chest rise with no increased work of breathing CV: Normal rate, no audible murmurs ABD: SNDNT +BS  EXT:  Trace edema in her bilateral lower extremities  Imaging: No results found.  Labs: BMET Recent Labs  Lab 07/26/20 1747 07/27/20 0115 07/27/20 0734 07/28/20 0246 07/28/20 0355  07/29/20 1137 07/31/20 1200  NA 139 137 138 138  --  140 139  K 6.6* 7.2* 5.6* 4.7  --  3.9 3.7  CL 110 111 110 106  --  105 103  CO2 13* 12* 14* 16*  --  24 27  GLUCOSE 113* 112* 146* 103*  --  94 92  BUN 115* 127* 128* 129*  --  67* 37*  CREATININE 12.06* 12.34* 12.33* 12.35*  --  7.14* 3.83*  CALCIUM 9.2 8.6* 8.4* 8.1*  --  8.1* 8.4*  PHOS  --   --  7.4*  --  8.1* 6.3* 3.6   CBC Recent Labs  Lab 07/26/20 1747 07/26/20 1747 07/27/20 0734 07/27/20 0734 07/28/20 0246 07/29/20 1137 07/31/20 1056 07/31/20 1200  WBC 12.8*   < > 13.2*   < > 10.1 9.3 12.0* 11.7*  NEUTROABS 11.4*  --  11.9*  --  7.8*  --  9.3*  --   HGB 13.4   < > 12.4   < > 10.7* 10.5* 9.8* 10.1*  HCT 43.5   < > 38.9   < > 32.4* 32.9* 31.7* 32.2*  MCV 93.8   < > 91.3   < > 90.5 92.9 97.5 96.1  PLT 242   < > 218   < > 171 131* 107* 108*   < > = values in this interval not displayed.    Medications:    . amLODipine  10 mg Oral Daily  . carvedilol  12.5 mg Oral BID WC  . Chlorhexidine Gluconate Cloth  6 each Topical Q0600  . heparin  5,000 Units Subcutaneous Q8H  . heparin sodium (porcine)      . hydrALAZINE  100 mg Oral Q8H  . multivitamin with minerals  1 tablet Oral Daily  . rosuvastatin  10 mg Oral Daily      Reesa Chew  08/02/2020, 10:45 AM

## 2020-08-02 NOTE — Progress Notes (Signed)
Renal Navigator received message from [facility] that [facility] is not able to accommodate patient at this time and referral has now been sent to [facility]. Navigator will continue to follow closely.  Alphonzo Cruise, [place] Renal Navigator [phone]

## 2020-08-02 NOTE — Progress Notes (Signed)
   08/02/20 0524  Hand-Off documentation  Handoff Given Given to shift RN/LPN  Report given to (Full Name) Eloy End, RN  Handoff Received Received from Transfer Unit/facility  Report received from (Full Name) Taylyn Brame, RN  Vital Signs  Temp 98.8 F (37.1 C)  Temp Source Oral  Pulse Rate 81  Pulse Rate Source Monitor  Resp 18  BP (!) 145/57  BP Location Right Arm  BP Method Automatic  Patient Position (if appropriate) Lying  Oxygen Therapy  SpO2 98 %  O2 Device Nasal Cannula  O2 Flow Rate (L/min) 2 L/min  Pain Assessment  Pain Scale 0-10  Pain Score 0  Post-Hemodialysis Assessment  Rinseback Volume (mL) 250 mL  KECN 281 V  Dialyzer Clearance Lightly streaked  Duration of HD Treatment -hour(s) 3.5 hour(s)  Hemodialysis Intake (mL) 500 mL  UF Total -Machine (mL) 3029 mL  Net UF (mL) 2529 mL  Tolerated HD Treatment Yes  Post-Hemodialysis Comments tx tx achieved as ordered, well tolerated.  AVG/AVF Arterial Site Held (minutes) 0 minutes  AVG/AVF Venous Site Held (minutes) 0 minutes  Education / Care Plan  Dialysis Education Provided Yes  Hemodialysis Catheter Right Internal jugular Double lumen Permanent (Tunneled)  Placement Date/Time: 07/27/20 1258   Time Out: Correct patient;Correct site;Correct procedure  Maximum sterile barrier precautions: Hand hygiene;Cap;Mask;Sterile gown;Sterile gloves;Large sterile sheet  Site Prep: Chlorhexidine (preferred)  Local Anes...  Site Condition Drainage  Blue Lumen Status Heparin locked;Capped (Central line)  Red Lumen Status Heparin locked;Capped (Central line)  Catheter fill solution Heparin 1000 units/ml  Catheter fill volume (Arterial) 1.6 cc  Catheter fill volume (Venous) 1.6  Dressing Type Occlusive  Dressing Status Clean;Dry;Intact

## 2020-08-02 NOTE — Progress Notes (Signed)
   08/02/20 0524  Hand-Off documentation  Handoff Given Given to shift RN/LPN  Report given to (Full Name) Eloy End, RN  Handoff Received Received from Transfer Unit/facility  Report received from (Full Name) Baylen Dea, RN  Vital Signs  Temp 98.8 F (37.1 C)  Temp Source Oral  Pulse Rate 81  Pulse Rate Source Monitor  Resp 18  BP (!) 145/57  BP Location Right Arm  BP Method Automatic  Patient Position (if appropriate) Lying  Oxygen Therapy  SpO2 98 %  O2 Device Nasal Cannula  O2 Flow Rate (L/min) 2 L/min  Pain Assessment  Pain Scale 0-10  Pain Score 0  Post-Hemodialysis Assessment  Rinseback Volume (mL) 250 mL  KECN 281 V  Dialyzer Clearance Lightly streaked  Duration of HD Treatment -hour(s) 3.5 hour(s)  Hemodialysis Intake (mL) 500 mL  UF Total -Machine (mL) 3029 mL  Net UF (mL) 2529 mL  Tolerated HD Treatment Yes  Post-Hemodialysis Comments tx tx achieved as ordered, well tolerated.  AVG/AVF Arterial Site Held (minutes) 0 minutes  AVG/AVF Venous Site Held (minutes) 0 minutes  Education / Care Plan  Dialysis Education Provided Yes  Hemodialysis Catheter Right Internal jugular Double lumen Permanent (Tunneled)  Placement Date/Time: 07/27/20 1258   Time Out: Correct patient;Correct site;Correct procedure  Maximum sterile barrier precautions: Hand hygiene;Cap;Mask;Sterile gown;Sterile gloves;Large sterile sheet  Site Prep: Chlorhexidine (preferred)  Local Anes...  Site Condition Drainage  Blue Lumen Status Heparin locked;Capped (Central line)  Red Lumen Status Heparin locked;Capped (Central line)  Catheter fill solution Heparin 1000 units/ml  Catheter fill volume (Arterial) 1.6 cc  Catheter fill volume (Venous) 1.6  Dressing Type Occlusive  Dressing Status Clean;Dry;Intact

## 2020-08-02 NOTE — Progress Notes (Signed)
[facility]  PROGRESS NOTE  Cassandra Dyer JJK:[id number] DOB: [birth year] DOA: 07/26/2020 PCP: Biagio Borg, MD Admit date - 07/26/2020   Admitting Physician Vernelle Emerald, MD  Outpatient Primary MD for the patient is Biagio Borg, MD  LOS - 6 Brief Narrative   Cassandra Dyer is a 79 y.o. year old female with medical history significant for HTN, HLD, CKD stage III, hepatitis C, chronic anemia, COPD, right nephrectomy in 2018 for renal cell cancer who presented on 07/26/2020 with shortness of breath, vomiting, diarrhea and generalized weakness for the past week, as well as intermittent episodes of lower extremity swelling was found to have creatinine of 12 (previous baseline of 1.3), potassium of 7.2, CO2 12, GFR 3, white count 12.8 concerning for AKI on CKD  Hospital course complicated by initiation of dialysis on 8/14  Subjective  Reports some nausea and vomiting late last night.  Currently states she feels much better.  Tolerated breakfast.  Went to dialysis earlier this morning.  Denies any bleeding.  Still having adequate bowel movements.  Reports urine output.  A & P   Severe oliguric AKI on CKD stage III in setting of prior remote right nephrectomy.  Unclear if this is progression of CKD likely due to HTN, possible acute exacerbation related to preceding symptoms of nausea, vomiting, diarrhea though that was only for short period of time.  Suspect the symptoms were likely uremic related.  Renal ultrasound showed no obvious obstruction, has not responded well to Lasix, not a biopsy candidate given solitary kidney per nephrology.  ANA positive, C3 mildly decreased otherwise normal serologies -Continue dialysis via [facility], Monday Wednesday Friday -Considering aVF/AVG per nephrology -Avoid nephrotoxins, renally dose medications for GFR less than 15 -Daily weights, strict I's and O's  Thrombocytopenia, improving.  On presentation on 8/13 platelet count of 240 2K.  Had previous  episodes of thrombocytopenia back in April 2021 with nadir of 70 K.  Platelets have slowly been decreasing.  Previously 100 and 8K on 8/19, now 141K no signs or symptoms of bleeding -Add differential to CBC -Peripheral smear  Hyperkalemia, resolved.  With calcium gluconate, Lokelma, insulin, bicarb on admission.  Normocytic anemia, hemoglobin stable.  Iron panel consistent with iron deficiency -Continue iron load  Dyspnea, improving.  Has COPD but no active wheezing.  Has improved with dialysis, likely due to volume overload.  All pleural effusions noted on admitting chest x-ray, no current O2 requirements -Monitor respiratory status  HTN, improved control  -Hydralazine 100 mg 3 times daily -Discontinue metoprolol in favor of Coreg 12.5 mg twice daily -Continue amlodipine 10 mg daily -Discontinued home ARB due to AKI  Leukocytosis, mild, resolved likely stress leukocytosis.  Remains afebrile -Monitor CBC  HLD, stable -Continue Crestor  Acute diarrhea, present on admission, resolved.  Per patient ongoing 3 to 4 days prior to presentation, potential nidus of AKI  History of COPD, no wheezing on exam -As needed albuterol inhaler     Family Communication  : None  Code Status : DNR, discussed on day of admission  Disposition Plan  :  Patient is from home. Anticipated d/c date: 2 to 3 days. Barriers to d/c or necessity for inpatient status:  Continue monitoring of creatinine/kidney function, dialysis needs, volume status Consults  : Nephrology, IR  Procedures  :    DVT Prophylaxis  : Heparin  Lab Results  Component Value Date   PLT 141 (L) 08/02/2020    Diet :  Diet Order  Diet renal with fluid restriction Room service appropriate? Yes; Fluid consistency: Thin  Diet effective now                  Inpatient Medications Scheduled Meds: . amLODipine  10 mg Oral Daily  . carvedilol  12.5 mg Oral BID WC  . Chlorhexidine Gluconate Cloth  6 each Topical  Q0600  . heparin  5,000 Units Subcutaneous Q8H  . heparin sodium (porcine)      . hydrALAZINE  100 mg Oral Q8H  . multivitamin with minerals  1 tablet Oral Daily  . rosuvastatin  10 mg Oral Daily   Continuous Infusions:  PRN Meds:.acetaminophen **OR** acetaminophen, hydrALAZINE, ondansetron **OR** ondansetron (ZOFRAN) IV, polyethylene glycol  Antibiotics  :   Anti-infectives (From admission, onward)   Start     Dose/Rate Route Frequency Ordered Stop   07/27/20 1253  ceFAZolin (ANCEF) IVPB 2g/100 mL premix        over 30 Minutes Intravenous Continuous PRN 07/27/20 1253 07/27/20 1253   07/27/20 1244  ceFAZolin (ANCEF) 2-4 GM/100ML-% IVPB       Note to Pharmacy: Margaretmary Dys   : cabinet override      07/27/20 1244 07/28/20 0044       Objective   Vitals:   08/02/20 0500 08/02/20 0524 08/02/20 0754 08/02/20 1116  BP: (!) 147/60 (!) 145/57  (!) 129/51  Pulse: 76 81 85 83  Resp:  18 18 19   Temp:  98.8 F (37.1 C)  99.7 F (37.6 C)  TempSrc:  Oral  Oral  SpO2:  98% 92% 93%  Weight:      Height:        SpO2: 93 % O2 Flow Rate (L/min): 2 L/min  Wt Readings from Last 3 Encounters:  07/31/20 73.8 kg  05/23/20 64.4 kg  04/26/20 66.7 kg     Intake/Output Summary (Last 24 hours) at 08/02/2020 1419 Last data filed at 08/02/2020 0524 Gross per 24 hour  Intake --  Output 2529 ml  Net -2529 ml    Physical Exam:     Awake Alert, Oriented X 3, Normal affect No new F.N deficits,  [place].AT, Normal respiratory effort on room air, CTAB Dialysis catheter in right chest wall, clean, dry Diffuse nonpitting edema of bilateral upper and lower extremities, seems to be improving RRR,No Gallops,Rubs or new Murmurs,  +ve B.Sounds, Abd Soft, No tenderness, No rebound, guarding or rigidity.    I have personally reviewed the following:   Data Reviewed:  CBC Recent Labs  Lab 07/26/20 1747 07/26/20 1747 07/27/20 0734 07/27/20 0734 07/28/20 0246 07/29/20 1137 07/31/20 1056  07/31/20 1200 08/02/20 1011  WBC 12.8*   < > 13.2*   < > 10.1 9.3 12.0* 11.7* 10.1  HGB 13.4   < > 12.4   < > 10.7* 10.5* 9.8* 10.1* 9.5*  HCT 43.5   < > 38.9   < > 32.4* 32.9* 31.7* 32.2* 29.4*  PLT 242   < > 218   < > 171 131* 107* 108* 141*  MCV 93.8   < > 91.3   < > 90.5 92.9 97.5 96.1 95.8  MCH 28.9   < > 29.1   < > 29.9 29.7 30.2 30.1 30.9  MCHC 30.8   < > 31.9   < > 33.0 31.9 30.9 31.4 32.3  RDW 17.2*   < > 16.9*   < > 16.9* 16.9* 16.8* 16.6* 15.8*  LYMPHSABS 0.6*  --  0.5*  --  0.9  --  0.8  --  0.4*  MONOABS 0.7  --  0.7  --  1.4*  --  1.6*  --  1.4*  EOSABS 0.0  --  0.0  --  0.1  --  0.2  --  0.1  BASOSABS 0.0  --  0.0  --  0.0  --  0.0  --  0.0   < > = values in this interval not displayed.    Chemistries  Recent Labs  Lab 07/26/20 1747 07/26/20 1747 07/27/20 0115 07/27/20 0734 07/28/20 0246 07/29/20 1137 07/31/20 1200  NA 139   < > 137 138 138 140 139  K 6.6*   < > 7.2* 5.6* 4.7 3.9 3.7  CL 110   < > 111 110 106 105 103  CO2 13*   < > 12* 14* 16* 24 27  GLUCOSE 113*   < > 112* 146* 103* 94 92  BUN 115*   < > 127* 128* 129* 67* 37*  CREATININE 12.06*   < > 12.34* 12.33* 12.35* 7.14* 3.83*  CALCIUM 9.2   < > 8.6* 8.4* 8.1* 8.1* 8.4*  MG  --   --  2.5*  --  2.4  --   --   AST 33  --   --   --  26  --   --   ALT 32  --   --   --  20  --   --   ALKPHOS 46  --   --   --  32*  --   --   BILITOT 1.1  --   --   --  0.6  --   --    < > = values in this interval not displayed.   ------------------------------------------------------------------------------------------------------------------ No results for input(s): CHOL, HDL, LDLCALC, TRIG, CHOLHDL, LDLDIRECT in the last 72 hours.  Lab Results  Component Value Date   HGBA1C 5.6 04/08/2020   ------------------------------------------------------------------------------------------------------------------ No results for input(s): TSH, T4TOTAL, T3FREE, THYROIDAB in the last 72 hours.  Invalid input(s):  FREET3 ------------------------------------------------------------------------------------------------------------------ No results for input(s): VITAMINB12, FOLATE, FERRITIN, TIBC, IRON, RETICCTPCT in the last 72 hours.  Coagulation profile Recent Labs  Lab 07/27/20 0734  INR 1.3*    No results for input(s): DDIMER in the last 72 hours.  Cardiac Enzymes No results for input(s): CKMB, TROPONINI, MYOGLOBIN in the last 168 hours.  Invalid input(s): CK ------------------------------------------------------------------------------------------------------------------ No results found for: BNP  Micro Results Recent Results (from the past 240 hour(s))  SARS Coronavirus 2 by RT PCR (hospital order, performed in [facility] hospital lab) Nasopharyngeal Nasopharyngeal Swab     Status: None   Collection Time: 07/26/20  5:31 PM   Specimen: Nasopharyngeal Swab  Result Value Ref Range Status   SARS Coronavirus 2 NEGATIVE NEGATIVE Final    Comment: (NOTE) SARS-CoV-2 target nucleic acids are NOT DETECTED.  The SARS-CoV-2 RNA is generally detectable in upper and lower respiratory specimens during the acute phase of infection. The lowest concentration of SARS-CoV-2 viral copies this assay can detect is 250 copies / mL. A negative result does not preclude SARS-CoV-2 infection and should not be used as the sole basis for treatment or other patient management decisions.  A negative result may occur with improper specimen collection / handling, submission of specimen other than nasopharyngeal swab, presence of viral mutation(s) within the areas targeted by this assay, and inadequate number of viral copies (<250 copies / mL). A negative result must be combined with clinical observations,  patient history, and epidemiological information.  Fact Sheet for Patients:   StrictlyIdeas.no  Fact Sheet for Healthcare Providers: BankingDealers.co.za  This  test is not yet approved or  cleared by the [place] FDA and has been authorized for detection and/or diagnosis of SARS-CoV-2 by FDA under an Emergency Use Authorization (EUA).  This EUA will remain in effect (meaning this test can be used) for the duration of the COVID-19 declaration under Section 564(b)(1) of the Act, 21 U.S.C. section 360bbb-3(b)(1), unless the authorization is terminated or revoked sooner.  Performed at [facility], [place] [address], [place], [place] [address]   MRSA PCR Screening     Status: None   Collection Time: 07/27/20  8:25 AM   Specimen: Nasal Mucosa; Nasopharyngeal  Result Value Ref Range Status   MRSA by PCR NEGATIVE NEGATIVE Final    Comment:        The GeneXpert MRSA Assay (FDA approved for NASAL specimens only), is one component of a comprehensive MRSA colonization surveillance program. It is not intended to diagnose MRSA infection nor to guide or monitor treatment for MRSA infections. Performed at [facility], [place] [address], [place], [place] [address]   Gastrointestinal Panel by PCR , Stool     Status: None   Collection Time: 07/27/20 10:00 AM   Specimen: Stool  Result Value Ref Range Status   Campylobacter species NOT DETECTED NOT DETECTED Final   Plesimonas shigelloides NOT DETECTED NOT DETECTED Final   Salmonella species NOT DETECTED NOT DETECTED Final   Yersinia enterocolitica NOT DETECTED NOT DETECTED Final   Vibrio species NOT DETECTED NOT DETECTED Final   Vibrio cholerae NOT DETECTED NOT DETECTED Final   Enteroaggregative E coli (EAEC) NOT DETECTED NOT DETECTED Final   Enteropathogenic E coli (EPEC) NOT DETECTED NOT DETECTED Final   Enterotoxigenic E coli (ETEC) NOT DETECTED NOT DETECTED Final   Shiga like toxin producing E coli (STEC) NOT DETECTED NOT DETECTED Final   Shigella/Enteroinvasive E coli (EIEC) NOT DETECTED NOT DETECTED Final   Cryptosporidium NOT DETECTED NOT DETECTED Final   Cyclospora cayetanensis  NOT DETECTED NOT DETECTED Final   Entamoeba histolytica NOT DETECTED NOT DETECTED Final   Giardia lamblia NOT DETECTED NOT DETECTED Final   Adenovirus F40/41 NOT DETECTED NOT DETECTED Final   Astrovirus NOT DETECTED NOT DETECTED Final   Norovirus GI/GII NOT DETECTED NOT DETECTED Final   Rotavirus A NOT DETECTED NOT DETECTED Final   Sapovirus (I, II, IV, and V) NOT DETECTED NOT DETECTED Final    Comment: Performed at [facility], [address], [place], [place] [address]    Radiology Reports DG Chest 1 View  Result Date: 07/26/2020 CLINICAL DATA:  79 year old female with shortness of breath. EXAM: CHEST  1 VIEW COMPARISON:  Chest radiograph dated 04/26/2020 FINDINGS: Small bilateral pleural effusions with bibasilar atelectasis, increased since the prior radiograph. Pneumonia is not excluded. No pneumothorax. The cardiac borders are silhouetted. Atherosclerotic calcification of the aorta. No acute osseous pathology. IMPRESSION: Small bilateral pleural effusions with bibasilar atelectasis, increased since the prior radiograph. Electronically Signed   By: Anner Crete M.D.   On: 07/26/2020 20:24   US RENAL  Result Date: 07/27/2020 CLINICAL DATA:  Renal failure. EXAM: RENAL / URINARY TRACT ULTRASOUND COMPLETE COMPARISON:  CT 04/08/2020 FINDINGS: Right Kidney: Surgically absent. Left Kidney: Renal measurements: 9.6 x 5.4 x 4.8 cm = volume: 130 mL. Echogenicity within normal limits. No mass or hydronephrosis visualized. Bladder: Empty and not well assessed.  Other: Incidental right pleural effusion. IMPRESSION: 1. Unremarkable sonographic appearance of the left kidney. 2. Right nephrectomy. 3. Urinary bladder is empty and not well assessed. 4. Right pleural effusion incidentally noted. Electronically Signed   By: Keith Rake M.D.   On: 07/27/2020 04:04   IR Fluoro Guide CV Line Right  Result Date: 07/27/2020 CLINICAL DATA:  Acute kidney injury, uremia and need for emergent  hemodialysis. Request has been made to place a tunneled hemodialysis catheter due to predicted need for longer term hemodialysis. EXAM: TUNNELED CENTRAL VENOUS HEMODIALYSIS CATHETER PLACEMENT WITH ULTRASOUND AND FLUOROSCOPIC GUIDANCE ANESTHESIA/SEDATION: 2.0 mg IV Versed; 50 mcg IV Fentanyl. Total Moderate Sedation Time:   20 minutes. The patient's level of consciousness and physiologic status were continuously monitored during the procedure by Radiology nursing. MEDICATIONS: 2 g IV Ancef. FLUOROSCOPY TIME:  24 seconds.  2.0 mGy. PROCEDURE: The procedure, risks, benefits, and alternatives were explained to the patient. Questions regarding the procedure were encouraged and answered. The patient understands and consents to the procedure. A timeout was performed prior to initiating the procedure. The right neck and chest were prepped with chlorhexidine in a sterile fashion, and a sterile drape was applied covering the operative field. Maximum barrier sterile technique with sterile gowns and gloves were used for the procedure. Local anesthesia was provided with 1% lidocaine. Ultrasound was used to confirm patency of the right internal jugular vein. After creating a small venotomy incision, a 21 gauge needle was advanced into the right internal jugular vein under direct, real-time ultrasound guidance. Ultrasound image documentation was performed. After securing guidewire access, an 8 Fr dilator was placed. A J-wire was kinked to measure appropriate catheter length. A Palindrome tunneled hemodialysis catheter measuring 19 cm from tip to cuff was chosen for placement. This was tunneled in a retrograde fashion from the chest wall to the venotomy incision. At the venotomy, serial dilatation was performed and a 15 Fr peel-away sheath was placed over a guidewire. The catheter was then placed through the sheath and the sheath removed. Final catheter positioning was confirmed and documented with a fluoroscopic spot image. The  catheter was aspirated, flushed with saline, and injected with appropriate volume heparin dwells. The venotomy incision was closed with subcuticular 4-0 Vicryl. Dermabond was applied to the incision. The catheter exit site was secured with 0-Prolene retention sutures. COMPLICATIONS: None.  No pneumothorax. FINDINGS: After catheter placement, the tip lies in the right atrium. The catheter aspirates normally and is ready for immediate use. IMPRESSION: Placement of tunneled hemodialysis catheter via the right internal jugular vein. The catheter tip lies in the right atrium. The catheter is ready for immediate use. Electronically Signed   By: Aletta Edouard M.D.   On: 07/27/2020 15:00   IR US Guide Vasc Access Right  Result Date: 07/27/2020 CLINICAL DATA:  Acute kidney injury, uremia and need for emergent hemodialysis. Request has been made to place a tunneled hemodialysis catheter due to predicted need for longer term hemodialysis. EXAM: TUNNELED CENTRAL VENOUS HEMODIALYSIS CATHETER PLACEMENT WITH ULTRASOUND AND FLUOROSCOPIC GUIDANCE ANESTHESIA/SEDATION: 2.0 mg IV Versed; 50 mcg IV Fentanyl. Total Moderate Sedation Time:   20 minutes. The patient's level of consciousness and physiologic status were continuously monitored during the procedure by Radiology nursing. MEDICATIONS: 2 g IV Ancef. FLUOROSCOPY TIME:  24 seconds.  2.0 mGy. PROCEDURE: The procedure, risks, benefits, and alternatives were explained to the patient. Questions regarding the procedure were encouraged and answered. The patient understands and consents to the procedure. A timeout was  performed prior to initiating the procedure. The right neck and chest were prepped with chlorhexidine in a sterile fashion, and a sterile drape was applied covering the operative field. Maximum barrier sterile technique with sterile gowns and gloves were used for the procedure. Local anesthesia was provided with 1% lidocaine. Ultrasound was used to confirm patency of  the right internal jugular vein. After creating a small venotomy incision, a 21 gauge needle was advanced into the right internal jugular vein under direct, real-time ultrasound guidance. Ultrasound image documentation was performed. After securing guidewire access, an 8 Fr dilator was placed. A J-wire was kinked to measure appropriate catheter length. A Palindrome tunneled hemodialysis catheter measuring 19 cm from tip to cuff was chosen for placement. This was tunneled in a retrograde fashion from the chest wall to the venotomy incision. At the venotomy, serial dilatation was performed and a 15 Fr peel-away sheath was placed over a guidewire. The catheter was then placed through the sheath and the sheath removed. Final catheter positioning was confirmed and documented with a fluoroscopic spot image. The catheter was aspirated, flushed with saline, and injected with appropriate volume heparin dwells. The venotomy incision was closed with subcuticular 4-0 Vicryl. Dermabond was applied to the incision. The catheter exit site was secured with 0-Prolene retention sutures. COMPLICATIONS: None.  No pneumothorax. FINDINGS: After catheter placement, the tip lies in the right atrium. The catheter aspirates normally and is ready for immediate use. IMPRESSION: Placement of tunneled hemodialysis catheter via the right internal jugular vein. The catheter tip lies in the right atrium. The catheter is ready for immediate use. Electronically Signed   By: Aletta Edouard M.D.   On: 07/27/2020 15:00     Time Spent in minutes  30     Desiree Hane M.D on 08/02/2020 at 2:19 PM  To page go to [email or web address] - password [facility]

## 2020-08-03 DIAGNOSIS — D509 Iron deficiency anemia, unspecified: Secondary | ICD-10-CM

## 2020-08-03 LAB — CBC
HCT: 27.5 % — ABNORMAL LOW (ref 36.0–46.0)
Hemoglobin: 8.6 g/dL — ABNORMAL LOW (ref 12.0–15.0)
MCH: 30 pg (ref 26.0–34.0)
MCHC: 31.3 g/dL (ref 30.0–36.0)
MCV: 95.8 fL (ref 80.0–100.0)
Platelets: 164 10*3/uL (ref 150–400)
RBC: 2.87 MIL/uL — ABNORMAL LOW (ref 3.87–5.11)
RDW: 15.3 % (ref 11.5–15.5)
WBC: 8.9 10*3/uL (ref 4.0–10.5)
nRBC: 0 % (ref 0.0–0.2)

## 2020-08-03 MED ORDER — DARBEPOETIN ALFA 40 MCG/0.4ML IJ SOSY
40.0000 ug | PREFILLED_SYRINGE | INTRAMUSCULAR | Status: DC
  Administered 2020-08-05: 40 ug via INTRAVENOUS
  Filled 2020-08-03: qty 0.4

## 2020-08-03 NOTE — Progress Notes (Signed)
  [facility] Progress Note   79 y.o.female HTN, HLD, HCV, COPD, rt nephrectomy in 2018 for RCC p/w  nausea and vomiting and AKI on CKD  Assessment/ Plan:   1. Severe oliguric AKI on CKD3: S/p rt nephrectomy BL Cr 1.3-1.6 range in recent months presumably chronic component secondary to HTN.  No obvious obstruction.  Poor response to Lasix.  Not a biopsy candidate given solitary kidney.  Initiated dialysis on 8/14.  Serologies without obvious cause of kidney dysfunction -TDC in place -Continue with dialysis today maintaining MWF schedule -Consider AVF/AVG placement next week if the patient continues to show no signs of recovery over the weekend - Avoid contrast and other nephrotoxins if possible. - Renal dose medications for a GFR under 15 ml/min. - Strict I&O's, daily weights   2. Anemia  -Status post 1000 mg of iron -Start Aranesp 64mcg weekly  3. HTN -Continue hydralazine 100 mg 3 times daily, carvedilol 12.5 mg twice daily, amlodipine 10 mg daily 4. Remote h/o HCV  Subjective:   Tolerated dialysis with 2.6 L of ultrafiltration.  Patient denies any complaints today.   Objective:   BP (!) 112/48 (BP Location: Right Arm)   Pulse 75   Temp 98 F (36.7 C) (Oral)   Resp 14   Ht 5\' 2"  (1.575 m)   Wt 73.8 kg   SpO2 90%   BMI 29.76 kg/m   Intake/Output Summary (Last 24 hours) at 08/03/2020 1937 Last data filed at 08/02/2020 2030 Gross per 24 hour  Intake 360 ml  Output --  Net 360 ml   Weight change:   Physical Exam: GEN: NAD, sitting in bed HEENT: No conjunctival pallor, no nasal discharge LUNGS: Bilateral chest rise with no increased work of breathing CV: Normal rate, no audible murmurs ABD: SNDNT +BS  EXT:  Trace edema in her bilateral lower extremities  Imaging: No results found.  Labs: BMET Recent Labs  Lab 07/28/20 0246 07/28/20 0355 07/29/20 1137 07/31/20 1200  NA 138  --  140 139  K 4.7  --  3.9 3.7  CL 106  --  105 103  CO2 16*   --  24 27  GLUCOSE 103*  --  94 92  BUN 129*  --  67* 37*  CREATININE 12.35*  --  7.14* 3.83*  CALCIUM 8.1*  --  8.1* 8.4*  PHOS  --  8.1* 6.3* 3.6   CBC Recent Labs  Lab 07/28/20 0246 07/29/20 1137 07/31/20 1056 07/31/20 1200 08/02/20 1011 08/03/20 0814  WBC 10.1   < > 12.0* 11.7* 10.1 8.9  NEUTROABS 7.8*  --  9.3*  --  8.1*  --   HGB 10.7*   < > 9.8* 10.1* 9.5* 8.6*  HCT 32.4*   < > 31.7* 32.2* 29.4* 27.5*  MCV 90.5   < > 97.5 96.1 95.8 95.8  PLT 171   < > 107* 108* 141* 164   < > = values in this interval not displayed.    Medications:    . amLODipine  10 mg Oral Daily  . carvedilol  12.5 mg Oral BID WC  . Chlorhexidine Gluconate Cloth  6 each Topical Q0600  . heparin  5,000 Units Subcutaneous Q8H  . hydrALAZINE  100 mg Oral Q8H  . multivitamin with minerals  1 tablet Oral Daily  . rosuvastatin  10 mg Oral Daily      Reesa Chew  08/03/2020, 9:51 AM

## 2020-08-03 NOTE — Progress Notes (Signed)
[facility]  PROGRESS NOTE  Cassandra Dyer OZD:[id number] DOB: 07-Jan-1941 DOA: 07/26/2020 PCP: Biagio Borg, MD Admit date - 07/26/2020   Admitting Physician Vernelle Emerald, MD  Outpatient Primary MD for the patient is Biagio Borg, MD  LOS - 7 Brief Narrative   Cassandra Dyer is a 79 y.o. year old female with medical history significant for HTN, HLD, CKD stage III, hepatitis C, chronic anemia, COPD, right nephrectomy in 2018 for renal cell cancer who presented on 07/26/2020 with shortness of breath, vomiting, diarrhea and generalized weakness for the past week, as well as intermittent episodes of lower extremity swelling was found to have creatinine of 12 (previous baseline of 1.3), potassium of 7.2, CO2 12, GFR 3, white count 12.8 concerning for AKI on CKD  Hospital course complicated by initiation of dialysis on 8/14  Subjective  Still having occasional nausea.  No vomiting.  Tolerate dinner last night.  Has not had a bowel movement since Thursday.  Denies any abdominal pain  A & P   Severe oliguric AKI on CKD stage III in setting of prior remote right nephrectomy.  Unclear if this is progression of CKD likely due to HTN, possible acute exacerbation related to preceding symptoms of nausea, vomiting, diarrhea though that was only for short period of time.  Suspect the symptoms were likely uremic related.  Renal ultrasound showed no obvious obstruction, has not responded well to Lasix, not a biopsy candidate given solitary kidney per nephrology.  ANA positive, C3 mildly decreased otherwise normal serologies -Continue dialysis via [facility] Monday Wednesday Friday -Considering aVF/AVG per nephrology -Avoid nephrotoxins, renally dose medications for GFR less than 15 -Daily weights, strict I's and O's  Thrombocytopenia, improving.  On presentation on 8/13 platelet count of 240 2K.  Had previous episodes of thrombocytopenia back in April 2021 with nadir of 70 K.  Platelets have slowly  been decreasing.  Previously 108 8K on 8/19, now 164K, no signs or symptoms of bleeding -Monitor CBC  Hyperkalemia, resolved.  With calcium gluconate, Lokelma, insulin, bicarb on admission.  Normocytic anemia, hemoglobin stable.  Iron panel consistent with iron deficiency -Continue iron load  Dyspnea, improving.  Has COPD but no active wheezing.  Has improved with dialysis, likely due to volume overload.  All pleural effusions noted on admitting chest x-ray, no current O2 requirements -Monitor respiratory status  HTN, improved control  -Hydralazine 100 mg 3 times daily -Discontinue metoprolol in favor of Coreg 12.5 mg twice daily -Continue amlodipine 10 mg daily -Discontinued home ARB due to AKI  Leukocytosis, mild, resolved likely stress leukocytosis.  Remains afebrile -Monitor CBC  HLD, stable -Continue Crestor  Acute diarrhea, present on admission, resolved.  Per patient ongoing 3 to 4 days prior to presentation, potential nidus of AKI  History of COPD, no wheezing on exam -As needed albuterol inhaler     Family Communication  : None  Code Status : DNR, discussed on day of admission  Disposition Plan  :  Patient is from home. Anticipated d/c date: 2 to 3 days. Barriers to d/c or necessity for inpatient status:  Continue monitoring of creatinine/kidney function, dialysis needs, volume status Consults  : Nephrology, IR  Procedures  :    DVT Prophylaxis  : Heparin  Lab Results  Component Value Date   PLT 164 08/03/2020    Diet :  Diet Order            Diet renal with fluid restriction Room service appropriate?  Yes; Fluid consistency: Thin  Diet effective now                  Inpatient Medications Scheduled Meds: . amLODipine  10 mg Oral Daily  . carvedilol  12.5 mg Oral BID WC  . Chlorhexidine Gluconate Cloth  6 each Topical Q0600  . [START ON 08/05/2020] darbepoetin (ARANESP) injection - DIALYSIS  40 mcg Intravenous Q Mon-HD  . heparin  5,000 Units  Subcutaneous Q8H  . hydrALAZINE  100 mg Oral Q8H  . multivitamin with minerals  1 tablet Oral Daily  . rosuvastatin  10 mg Oral Daily   Continuous Infusions:  PRN Meds:.acetaminophen **OR** acetaminophen, hydrALAZINE, ondansetron **OR** ondansetron (ZOFRAN) IV, polyethylene glycol  Antibiotics  :   Anti-infectives (From admission, onward)   Start     Dose/Rate Route Frequency Ordered Stop   07/27/20 1253  ceFAZolin (ANCEF) IVPB 2g/100 mL premix        over 30 Minutes Intravenous Continuous PRN 07/27/20 1253 07/27/20 1253   07/27/20 1244  ceFAZolin (ANCEF) 2-4 GM/100ML-% IVPB       Note to Pharmacy: Margaretmary Dys   : cabinet override      07/27/20 1244 07/28/20 0044       Objective   Vitals:   08/02/20 2245 08/03/20 0312 08/03/20 0759 08/03/20 1141  BP: 118/73 (!) 137/52 (!) 112/48 (!) 127/54  Pulse: 74 83 75 81  Resp: (!) 21 20 14 20   Temp: 99.3 F (37.4 C) 99.5 F (37.5 C) 98 F (36.7 C) (!) 97.4 F (36.3 C)  TempSrc: Oral Oral Oral Oral  SpO2: 92% 92% 90% 92%  Weight:      Height:        SpO2: 92 % O2 Flow Rate (L/min): 2 L/min  Wt Readings from Last 3 Encounters:  07/31/20 73.8 kg  05/23/20 64.4 kg  04/26/20 66.7 kg     Intake/Output Summary (Last 24 hours) at 08/03/2020 1304 Last data filed at 08/03/2020 1200 Gross per 24 hour  Intake 840 ml  Output 300 ml  Net 540 ml    Physical Exam:     Awake Alert, Oriented X 3, Normal affect No new F.N deficits,  [place].AT, Normal respiratory effort on room air, CTAB Dialysis catheter in right chest wall, clean, dry Diffuse nonpitting edema of bilateral upper and lower extremities, seems to be improving RRR,No Gallops,Rubs or new Murmurs,  +ve B.Sounds, Abd Soft, No tenderness, No rebound, guarding or rigidity.    I have personally reviewed the following:   Data Reviewed:  CBC Recent Labs  Lab 07/28/20 0246 07/28/20 0246 07/29/20 1137 07/31/20 1056 07/31/20 1200 08/02/20 1011 08/03/20 0814  WBC  10.1   < > 9.3 12.0* 11.7* 10.1 8.9  HGB 10.7*   < > 10.5* 9.8* 10.1* 9.5* 8.6*  HCT 32.4*   < > 32.9* 31.7* 32.2* 29.4* 27.5*  PLT 171   < > 131* 107* 108* 141* 164  MCV 90.5   < > 92.9 97.5 96.1 95.8 95.8  MCH 29.9   < > 29.7 30.2 30.1 30.9 30.0  MCHC 33.0   < > 31.9 30.9 31.4 32.3 31.3  RDW 16.9*   < > 16.9* 16.8* 16.6* 15.8* 15.3  LYMPHSABS 0.9  --   --  0.8  --  0.4*  --   MONOABS 1.4*  --   --  1.6*  --  1.4*  --   EOSABS 0.1  --   --  0.2  --  0.1  --   BASOSABS 0.0  --   --  0.0  --  0.0  --    < > = values in this interval not displayed.    Chemistries  Recent Labs  Lab 07/28/20 0246 07/29/20 1137 07/31/20 1200  NA 138 140 139  K 4.7 3.9 3.7  CL 106 105 103  CO2 16* 24 27  GLUCOSE 103* 94 92  BUN 129* 67* 37*  CREATININE 12.35* 7.14* 3.83*  CALCIUM 8.1* 8.1* 8.4*  MG 2.4  --   --   AST 26  --   --   ALT 20  --   --   ALKPHOS 32*  --   --   BILITOT 0.6  --   --    ------------------------------------------------------------------------------------------------------------------ No results for input(s): CHOL, HDL, LDLCALC, TRIG, CHOLHDL, LDLDIRECT in the last 72 hours.  Lab Results  Component Value Date   HGBA1C 5.6 04/08/2020   ------------------------------------------------------------------------------------------------------------------ No results for input(s): TSH, T4TOTAL, T3FREE, THYROIDAB in the last 72 hours.  Invalid input(s): FREET3 ------------------------------------------------------------------------------------------------------------------ No results for input(s): VITAMINB12, FOLATE, FERRITIN, TIBC, IRON, RETICCTPCT in the last 72 hours.  Coagulation profile No results for input(s): INR, PROTIME in the last 168 hours.  No results for input(s): DDIMER in the last 72 hours.  Cardiac Enzymes No results for input(s): CKMB, TROPONINI, MYOGLOBIN in the last 168 hours.  Invalid input(s):  CK ------------------------------------------------------------------------------------------------------------------ No results found for: BNP  Micro Results Recent Results (from the past 240 hour(s))  SARS Coronavirus 2 by RT PCR (hospital order, performed in [facility] hospital lab) Nasopharyngeal Nasopharyngeal Swab     Status: None   Collection Time: 07/26/20  5:31 PM   Specimen: Nasopharyngeal Swab  Result Value Ref Range Status   SARS Coronavirus 2 NEGATIVE NEGATIVE Final    Comment: (NOTE) SARS-CoV-2 target nucleic acids are NOT DETECTED.  The SARS-CoV-2 RNA is generally detectable in upper and lower respiratory specimens during the acute phase of infection. The lowest concentration of SARS-CoV-2 viral copies this assay can detect is 250 copies / mL. A negative result does not preclude SARS-CoV-2 infection and should not be used as the sole basis for treatment or other patient management decisions.  A negative result may occur with improper specimen collection / handling, submission of specimen other than nasopharyngeal swab, presence of viral mutation(s) within the areas targeted by this assay, and inadequate number of viral copies (<250 copies / mL). A negative result must be combined with clinical observations, patient history, and epidemiological information.  Fact Sheet for Patients:   StrictlyIdeas.no  Fact Sheet for Healthcare Providers: BankingDealers.co.za  This test is not yet approved or  cleared by the [place] FDA and has been authorized for detection and/or diagnosis of SARS-CoV-2 by FDA under an Emergency Use Authorization (EUA).  This EUA will remain in effect (meaning this test can be used) for the duration of the COVID-19 declaration under Section 564(b)(1) of the Act, 21 U.S.C. section 360bbb-3(b)(1), unless the authorization is terminated or revoked sooner.  Performed at [facility],  [place] [address], [place], [place] [address]   MRSA PCR Screening     Status: None   Collection Time: 07/27/20  8:25 AM   Specimen: Nasal Mucosa; Nasopharyngeal  Result Value Ref Range Status   MRSA by PCR NEGATIVE NEGATIVE Final    Comment:        The GeneXpert MRSA Assay (FDA approved for NASAL specimens only), is one component of  a comprehensive MRSA colonization surveillance program. It is not intended to diagnose MRSA infection nor to guide or monitor treatment for MRSA infections. Performed at [facility], [place] [address] [place], [place] [address]   Gastrointestinal Panel by PCR , Stool     Status: None   Collection Time: 07/27/20 10:00 AM   Specimen: Stool  Result Value Ref Range Status   Campylobacter species NOT DETECTED NOT DETECTED Final   Plesimonas shigelloides NOT DETECTED NOT DETECTED Final   Salmonella species NOT DETECTED NOT DETECTED Final   Yersinia enterocolitica NOT DETECTED NOT DETECTED Final   Vibrio species NOT DETECTED NOT DETECTED Final   Vibrio cholerae NOT DETECTED NOT DETECTED Final   Enteroaggregative E coli (EAEC) NOT DETECTED NOT DETECTED Final   Enteropathogenic E coli (EPEC) NOT DETECTED NOT DETECTED Final   Enterotoxigenic E coli (ETEC) NOT DETECTED NOT DETECTED Final   Shiga like toxin producing E coli (STEC) NOT DETECTED NOT DETECTED Final   Shigella/Enteroinvasive E coli (EIEC) NOT DETECTED NOT DETECTED Final   Cryptosporidium NOT DETECTED NOT DETECTED Final   Cyclospora cayetanensis NOT DETECTED NOT DETECTED Final   Entamoeba histolytica NOT DETECTED NOT DETECTED Final   Giardia lamblia NOT DETECTED NOT DETECTED Final   Adenovirus F40/41 NOT DETECTED NOT DETECTED Final   Astrovirus NOT DETECTED NOT DETECTED Final   Norovirus GI/GII NOT DETECTED NOT DETECTED Final   Rotavirus A NOT DETECTED NOT DETECTED Final   Sapovirus (I, II, IV, and V) NOT DETECTED NOT DETECTED Final    Comment: Performed at [facility], [address] [place], [place] [address]    Radiology Reports DG Chest 1 View  Result Date: 07/26/2020 CLINICAL DATA:  79 year old female with shortness of breath. EXAM: CHEST  1 VIEW COMPARISON:  Chest radiograph dated 04/26/2020 FINDINGS: Small bilateral pleural effusions with bibasilar atelectasis, increased since the prior radiograph. Pneumonia is not excluded. No pneumothorax. The cardiac borders are silhouetted. Atherosclerotic calcification of the aorta. No acute osseous pathology. IMPRESSION: Small bilateral pleural effusions with bibasilar atelectasis, increased since the prior radiograph. Electronically Signed   By: Anner Crete M.D.   On: 07/26/2020 20:24   US RENAL  Result Date: 07/27/2020 CLINICAL DATA:  Renal failure. EXAM: RENAL / URINARY TRACT ULTRASOUND COMPLETE COMPARISON:  CT 04/08/2020 FINDINGS: Right Kidney: Surgically absent. Left Kidney: Renal measurements: 9.6 x 5.4 x 4.8 cm = volume: 130 mL. Echogenicity within normal limits. No mass or hydronephrosis visualized. Bladder: Empty and not well assessed. Other: Incidental right pleural effusion. IMPRESSION: 1. Unremarkable sonographic appearance of the left kidney. 2. Right nephrectomy. 3. Urinary bladder is empty and not well assessed. 4. Right pleural effusion incidentally noted. Electronically Signed   By: Keith Rake M.D.   On: 07/27/2020 04:04   IR Fluoro Guide CV Line Right  Result Date: 07/27/2020 CLINICAL DATA:  Acute kidney injury, uremia and need for emergent hemodialysis. Request has been made to place a tunneled hemodialysis catheter due to predicted need for longer term hemodialysis. EXAM: TUNNELED CENTRAL VENOUS HEMODIALYSIS CATHETER PLACEMENT WITH ULTRASOUND AND FLUOROSCOPIC GUIDANCE ANESTHESIA/SEDATION: 2.0 mg IV Versed; 50 mcg IV Fentanyl. Total Moderate Sedation Time:   20 minutes. The patient's level of consciousness and physiologic status were continuously monitored during the procedure by Radiology nursing.  MEDICATIONS: 2 g IV Ancef. FLUOROSCOPY TIME:  24 seconds.  2.0 mGy. PROCEDURE: The procedure, risks, benefits, and alternatives were explained to the patient. Questions regarding the procedure were encouraged and answered. The patient  understands and consents to the procedure. A timeout was performed prior to initiating the procedure. The right neck and chest were prepped with chlorhexidine in a sterile fashion, and a sterile drape was applied covering the operative field. Maximum barrier sterile technique with sterile gowns and gloves were used for the procedure. Local anesthesia was provided with 1% lidocaine. Ultrasound was used to confirm patency of the right internal jugular vein. After creating a small venotomy incision, a 21 gauge needle was advanced into the right internal jugular vein under direct, real-time ultrasound guidance. Ultrasound image documentation was performed. After securing guidewire access, an 8 Fr dilator was placed. A J-wire was kinked to measure appropriate catheter length. A Palindrome tunneled hemodialysis catheter measuring 19 cm from tip to cuff was chosen for placement. This was tunneled in a retrograde fashion from the chest wall to the venotomy incision. At the venotomy, serial dilatation was performed and a 15 Fr peel-away sheath was placed over a guidewire. The catheter was then placed through the sheath and the sheath removed. Final catheter positioning was confirmed and documented with a fluoroscopic spot image. The catheter was aspirated, flushed with saline, and injected with appropriate volume heparin dwells. The venotomy incision was closed with subcuticular 4-0 Vicryl. Dermabond was applied to the incision. The catheter exit site was secured with 0-Prolene retention sutures. COMPLICATIONS: None.  No pneumothorax. FINDINGS: After catheter placement, the tip lies in the right atrium. The catheter aspirates normally and is ready for immediate use. IMPRESSION: Placement of  tunneled hemodialysis catheter via the right internal jugular vein. The catheter tip lies in the right atrium. The catheter is ready for immediate use. Electronically Signed   By: Aletta Edouard M.D.   On: 07/27/2020 15:00   IR US Guide Vasc Access Right  Result Date: 07/27/2020 CLINICAL DATA:  Acute kidney injury, uremia and need for emergent hemodialysis. Request has been made to place a tunneled hemodialysis catheter due to predicted need for longer term hemodialysis. EXAM: TUNNELED CENTRAL VENOUS HEMODIALYSIS CATHETER PLACEMENT WITH ULTRASOUND AND FLUOROSCOPIC GUIDANCE ANESTHESIA/SEDATION: 2.0 mg IV Versed; 50 mcg IV Fentanyl. Total Moderate Sedation Time:   20 minutes. The patient's level of consciousness and physiologic status were continuously monitored during the procedure by Radiology nursing. MEDICATIONS: 2 g IV Ancef. FLUOROSCOPY TIME:  24 seconds.  2.0 mGy. PROCEDURE: The procedure, risks, benefits, and alternatives were explained to the patient. Questions regarding the procedure were encouraged and answered. The patient understands and consents to the procedure. A timeout was performed prior to initiating the procedure. The right neck and chest were prepped with chlorhexidine in a sterile fashion, and a sterile drape was applied covering the operative field. Maximum barrier sterile technique with sterile gowns and gloves were used for the procedure. Local anesthesia was provided with 1% lidocaine. Ultrasound was used to confirm patency of the right internal jugular vein. After creating a small venotomy incision, a 21 gauge needle was advanced into the right internal jugular vein under direct, real-time ultrasound guidance. Ultrasound image documentation was performed. After securing guidewire access, an 8 Fr dilator was placed. A J-wire was kinked to measure appropriate catheter length. A Palindrome tunneled hemodialysis catheter measuring 19 cm from tip to cuff was chosen for placement. This was  tunneled in a retrograde fashion from the chest wall to the venotomy incision. At the venotomy, serial dilatation was performed and a 15 Fr peel-away sheath was placed over a guidewire. The catheter was then placed through the sheath and the sheath  removed. Final catheter positioning was confirmed and documented with a fluoroscopic spot image. The catheter was aspirated, flushed with saline, and injected with appropriate volume heparin dwells. The venotomy incision was closed with subcuticular 4-0 Vicryl. Dermabond was applied to the incision. The catheter exit site was secured with 0-Prolene retention sutures. COMPLICATIONS: None.  No pneumothorax. FINDINGS: After catheter placement, the tip lies in the right atrium. The catheter aspirates normally and is ready for immediate use. IMPRESSION: Placement of tunneled hemodialysis catheter via the right internal jugular vein. The catheter tip lies in the right atrium. The catheter is ready for immediate use. Electronically Signed   By: Aletta Edouard M.D.   On: 07/27/2020 15:00     Time Spent in minutes  30     Desiree Hane M.D on 08/03/2020 at 1:04 PM  To page go to [email or web address] - password [facility]

## 2020-08-04 ENCOUNTER — Inpatient Hospital Stay (HOSPITAL_COMMUNITY): Payer: Medicare Other

## 2020-08-04 DIAGNOSIS — R059 Cough, unspecified: Secondary | ICD-10-CM

## 2020-08-04 DIAGNOSIS — R05 Cough: Secondary | ICD-10-CM

## 2020-08-04 LAB — CBC WITH DIFFERENTIAL/PLATELET
Abs Immature Granulocytes: 0.03 10*3/uL (ref 0.00–0.07)
Basophils Absolute: 0 10*3/uL (ref 0.0–0.1)
Basophils Relative: 0 %
Eosinophils Absolute: 0.2 10*3/uL (ref 0.0–0.5)
Eosinophils Relative: 2 %
HCT: 28.7 % — ABNORMAL LOW (ref 36.0–46.0)
Hemoglobin: 9.3 g/dL — ABNORMAL LOW (ref 12.0–15.0)
Immature Granulocytes: 0 %
Lymphocytes Relative: 7 %
Lymphs Abs: 0.6 10*3/uL — ABNORMAL LOW (ref 0.7–4.0)
MCH: 31.1 pg (ref 26.0–34.0)
MCHC: 32.4 g/dL (ref 30.0–36.0)
MCV: 96 fL (ref 80.0–100.0)
Monocytes Absolute: 1.5 10*3/uL — ABNORMAL HIGH (ref 0.1–1.0)
Monocytes Relative: 17 %
Neutro Abs: 6.6 10*3/uL (ref 1.7–7.7)
Neutrophils Relative %: 74 %
Platelets: 221 10*3/uL (ref 150–400)
RBC: 2.99 MIL/uL — ABNORMAL LOW (ref 3.87–5.11)
RDW: 14.9 % (ref 11.5–15.5)
WBC: 9 10*3/uL (ref 4.0–10.5)
nRBC: 0 % (ref 0.0–0.2)

## 2020-08-04 MED ORDER — FUROSEMIDE 80 MG PO TABS
80.0000 mg | ORAL_TABLET | Freq: Two times a day (BID) | ORAL | Status: DC
  Administered 2020-08-04 – 2020-08-05 (×3): 80 mg via ORAL
  Filled 2020-08-04 (×3): qty 1

## 2020-08-04 NOTE — Progress Notes (Signed)
[facility]  PROGRESS NOTE  Cassandra Dyer YSA:[id number] DOB: Dec 29, 1940 DOA: 07/26/2020 PCP: Biagio Borg, MD Admit date - 07/26/2020   Admitting Physician Vernelle Emerald, MD  Outpatient Primary MD for the patient is Biagio Borg, MD  LOS - 8 Brief Narrative   Cassandra Dyer is a 79 y.o. year old female with medical history significant for HTN, HLD, CKD stage III, hepatitis C, chronic anemia, COPD, right nephrectomy in 2018 for renal cell cancer who presented on 07/26/2020 with shortness of breath, vomiting, diarrhea and generalized weakness for the past week, as well as intermittent episodes of lower extremity swelling was found to have creatinine of 12 (previous baseline of 1.3), potassium of 7.2, CO2 12, GFR 3, white count 12.8 concerning for AKI on CKD  Hospital course complicated by initiation of dialysis on 8/14  Subjective  Still having occasional nausea.  No vomiting.  Had a BM yesterday. Mild nonproductive cough. NO SOB  A & P   Severe oliguric AKI on CKD stage III in setting of prior remote right nephrectomy.  Unclear if this is progression of CKD likely due to HTN, possible acute exacerbation related to preceding symptoms of nausea, vomiting, diarrhea though that was only for short period of time.  Suspect the symptoms were likely uremic related.  Renal ultrasound showed no obvious obstruction, has not responded well to Lasix, not a biopsy candidate given solitary kidney per nephrology.  ANA positive, C3 mildly decreased otherwise normal serologies -Continue dialysis via [facility], Monday Wednesday Friday -Considering aVF/AVG per nephrology -Avoid nephrotoxins, renally dose medications for GFR less than 15 -Daily weights, strict I's and O's  Thrombocytopenia, improving.  On presentation on 8/13 platelet count of 240 2K.  Had previous episodes of thrombocytopenia back in April 2021 with nadir of 70 K.  Platelets have slowly been decreasing.  Previously 108 8K on 8/19, now  wnl, no signs or symptoms of bleeding -Monitor CBC  Hyperkalemia, resolved.  With calcium gluconate, Lokelma, insulin, bicarb on admission.  Normocytic anemia, hemoglobin stable.  Iron panel consistent with iron deficiency -Continue iron load  Dyspnea, improving.  Has COPD but no active wheezing.  Has improved with dialysis, likely due to volume overload.  Small pleural effusions noted on admitting chest x-ray, and on 2 L O2. Complains of cough --CXR to assess pleural effusion and possible pulm edema --Agree with starting lasix in meantime and monitoring output -Monitor respiratory status  HTN, improved control  -Hydralazine 100 mg 3 times daily -Discontinued metoprolol in favor of Coreg 12.5 mg twice daily -Continue amlodipine 10 mg daily -Discontinued home ARB due to AKI  Leukocytosis, mild, resolved likely stress leukocytosis.  Remains afebrile -Monitor CBC  HLD, stable -Continue Crestor  Acute diarrhea, present on admission, resolved.  Per patient ongoing 3 to 4 days prior to presentation, potential nidus of AKI  History of COPD, no wheezing on exam -As needed albuterol inhaler     Family Communication  : None  Code Status : DNR, discussed on day of admission  Disposition Plan  :  Patient is from home. Anticipated d/c date: 2 to 3 days. Barriers to d/c or necessity for inpatient status:  Continue monitoring of creatinine/kidney function, dialysis needs, volume status Consults  : Nephrology, IR  Procedures  :    DVT Prophylaxis  : Heparin  Lab Results  Component Value Date   PLT 221 08/04/2020    Diet :  Diet Order  Diet renal with fluid restriction Room service appropriate? Yes; Fluid consistency: Thin  Diet effective now                  Inpatient Medications Scheduled Meds: . amLODipine  10 mg Oral Daily  . carvedilol  12.5 mg Oral BID WC  . Chlorhexidine Gluconate Cloth  6 each Topical Q0600  . [START ON 08/05/2020] darbepoetin (ARANESP)  injection - DIALYSIS  40 mcg Intravenous Q Mon-HD  . furosemide  80 mg Oral BID  . heparin  5,000 Units Subcutaneous Q8H  . hydrALAZINE  100 mg Oral Q8H  . multivitamin with minerals  1 tablet Oral Daily  . rosuvastatin  10 mg Oral Daily   Continuous Infusions:  PRN Meds:.acetaminophen **OR** acetaminophen, hydrALAZINE, ondansetron **OR** ondansetron (ZOFRAN) IV, polyethylene glycol  Antibiotics  :   Anti-infectives (From admission, onward)   Start     Dose/Rate Route Frequency Ordered Stop   07/27/20 1253  ceFAZolin (ANCEF) IVPB 2g/100 mL premix        over 30 Minutes Intravenous Continuous PRN 07/27/20 1253 07/27/20 1253   07/27/20 1244  ceFAZolin (ANCEF) 2-4 GM/100ML-% IVPB       Note to Pharmacy: Margaretmary Dys   : cabinet override      07/27/20 1244 07/28/20 0044       Objective   Vitals:   08/04/20 0451 08/04/20 0721 08/04/20 0840 08/04/20 1105  BP: (!) 154/51 (!) 138/52 (!) 127/49 (!) 128/52  Pulse: 81 79 77 77  Resp: (!) 21 20 20 18   Temp: 99 F (37.2 C) 99 F (37.2 C) 98.2 F (36.8 C) 98.5 F (36.9 C)  TempSrc: Oral Oral Oral Oral  SpO2: 91% 91% 91% 94%  Weight:      Height:        SpO2: 94 % O2 Flow Rate (L/min): 2 L/min  Wt Readings from Last 3 Encounters:  07/31/20 73.8 kg  05/23/20 64.4 kg  04/26/20 66.7 kg     Intake/Output Summary (Last 24 hours) at 08/04/2020 1221 Last data filed at 08/04/2020 1158 Gross per 24 hour  Intake 640 ml  Output 0 ml  Net 640 ml    Physical Exam:     Awake Alert, Oriented X 3, Normal affect No new F.N deficits,  [place].AT, Normal respiratory effort on 2L, decreased breath sounds throughout Dialysis catheter in right chest wall, clean, dry Diffuse nonpitting edema of bilateral upper and lower extremities, seems to be improving RRR,No Gallops,Rubs or new Murmurs,  +ve B.Sounds, Abd Soft, No tenderness, No rebound, guarding or rigidity.    I have personally reviewed the following:   Data  Reviewed:  CBC Recent Labs  Lab 07/31/20 1056 07/31/20 1200 08/02/20 1011 08/03/20 0814 08/04/20 0133  WBC 12.0* 11.7* 10.1 8.9 9.0  HGB 9.8* 10.1* 9.5* 8.6* 9.3*  HCT 31.7* 32.2* 29.4* 27.5* 28.7*  PLT 107* 108* 141* 164 221  MCV 97.5 96.1 95.8 95.8 96.0  MCH 30.2 30.1 30.9 30.0 31.1  MCHC 30.9 31.4 32.3 31.3 32.4  RDW 16.8* 16.6* 15.8* 15.3 14.9  LYMPHSABS 0.8  --  0.4*  --  0.6*  MONOABS 1.6*  --  1.4*  --  1.5*  EOSABS 0.2  --  0.1  --  0.2  BASOSABS 0.0  --  0.0  --  0.0    Chemistries  Recent Labs  Lab 07/29/20 1137 07/31/20 1200  NA 140 139  K 3.9 3.7  CL 105 103  CO2  24 27  GLUCOSE 94 92  BUN 67* 37*  CREATININE 7.14* 3.83*  CALCIUM 8.1* 8.4*   ------------------------------------------------------------------------------------------------------------------ No results for input(s): CHOL, HDL, LDLCALC, TRIG, CHOLHDL, LDLDIRECT in the last 72 hours.  Lab Results  Component Value Date   HGBA1C 5.6 04/08/2020   ------------------------------------------------------------------------------------------------------------------ No results for input(s): TSH, T4TOTAL, T3FREE, THYROIDAB in the last 72 hours.  Invalid input(s): FREET3 ------------------------------------------------------------------------------------------------------------------ No results for input(s): VITAMINB12, FOLATE, FERRITIN, TIBC, IRON, RETICCTPCT in the last 72 hours.  Coagulation profile No results for input(s): INR, PROTIME in the last 168 hours.  No results for input(s): DDIMER in the last 72 hours.  Cardiac Enzymes No results for input(s): CKMB, TROPONINI, MYOGLOBIN in the last 168 hours.  Invalid input(s): CK ------------------------------------------------------------------------------------------------------------------ No results found for: BNP  Micro Results Recent Results (from the past 240 hour(s))  SARS Coronavirus 2 by RT PCR (hospital order, performed in [facility] hospital lab) Nasopharyngeal Nasopharyngeal Swab     Status: None   Collection Time: 07/26/20  5:31 PM   Specimen: Nasopharyngeal Swab  Result Value Ref Range Status   SARS Coronavirus 2 NEGATIVE NEGATIVE Final    Comment: (NOTE) SARS-CoV-2 target nucleic acids are NOT DETECTED.  The SARS-CoV-2 RNA is generally detectable in upper and lower respiratory specimens during the acute phase of infection. The lowest concentration of SARS-CoV-2 viral copies this assay can detect is 250 copies / mL. A negative result does not preclude SARS-CoV-2 infection and should not be used as the sole basis for treatment or other patient management decisions.  A negative result may occur with improper specimen collection / handling, submission of specimen other than nasopharyngeal swab, presence of viral mutation(s) within the areas targeted by this assay, and inadequate number of viral copies (<250 copies / mL). A negative result must be combined with clinical observations, patient history, and epidemiological information.  Fact Sheet for Patients:   StrictlyIdeas.no  Fact Sheet for Healthcare Providers: BankingDealers.co.za  This test is not yet approved or  cleared by the [place] FDA and has been authorized for detection and/or diagnosis of SARS-CoV-2 by FDA under an Emergency Use Authorization (EUA).  This EUA will remain in effect (meaning this test can be used) for the duration of the COVID-19 declaration under Section 564(b)(1) of the Act, 21 U.S.C. section 360bbb-3(b)(1), unless the authorization is terminated or revoked sooner.  Performed at [facility], [place] [address], [place], [place] [address]   MRSA PCR Screening     Status: None   Collection Time: 07/27/20  8:25 AM   Specimen: Nasal Mucosa; Nasopharyngeal  Result Value Ref Range Status   MRSA by PCR NEGATIVE NEGATIVE Final    Comment:        The GeneXpert MRSA Assay  (FDA approved for NASAL specimens only), is one component of a comprehensive MRSA colonization surveillance program. It is not intended to diagnose MRSA infection nor to guide or monitor treatment for MRSA infections. Performed at [facility], [place] [address] [place], [place] [address]   Gastrointestinal Panel by PCR , Stool     Status: None   Collection Time: 07/27/20 10:00 AM   Specimen: Stool  Result Value Ref Range Status   Campylobacter species NOT DETECTED NOT DETECTED Final   Plesimonas shigelloides NOT DETECTED NOT DETECTED Final   Salmonella species NOT DETECTED NOT DETECTED Final   Yersinia enterocolitica NOT DETECTED NOT DETECTED Final   Vibrio species NOT DETECTED NOT DETECTED Final  Vibrio cholerae NOT DETECTED NOT DETECTED Final   Enteroaggregative E coli (EAEC) NOT DETECTED NOT DETECTED Final   Enteropathogenic E coli (EPEC) NOT DETECTED NOT DETECTED Final   Enterotoxigenic E coli (ETEC) NOT DETECTED NOT DETECTED Final   Shiga like toxin producing E coli (STEC) NOT DETECTED NOT DETECTED Final   Shigella/Enteroinvasive E coli (EIEC) NOT DETECTED NOT DETECTED Final   Cryptosporidium NOT DETECTED NOT DETECTED Final   Cyclospora cayetanensis NOT DETECTED NOT DETECTED Final   Entamoeba histolytica NOT DETECTED NOT DETECTED Final   Giardia lamblia NOT DETECTED NOT DETECTED Final   Adenovirus F40/41 NOT DETECTED NOT DETECTED Final   Astrovirus NOT DETECTED NOT DETECTED Final   Norovirus GI/GII NOT DETECTED NOT DETECTED Final   Rotavirus A NOT DETECTED NOT DETECTED Final   Sapovirus (I, II, IV, and V) NOT DETECTED NOT DETECTED Final    Comment: Performed at [facility], [address], [place], [place] [address]    Radiology Reports DG Chest 1 View  Result Date: 07/26/2020 CLINICAL DATA:  79 year old female with shortness of breath. EXAM: CHEST  1 VIEW COMPARISON:  Chest radiograph dated 04/26/2020 FINDINGS: Small bilateral pleural effusions with  bibasilar atelectasis, increased since the prior radiograph. Pneumonia is not excluded. No pneumothorax. The cardiac borders are silhouetted. Atherosclerotic calcification of the aorta. No acute osseous pathology. IMPRESSION: Small bilateral pleural effusions with bibasilar atelectasis, increased since the prior radiograph. Electronically Signed   By: Anner Crete M.D.   On: 07/26/2020 20:24   US RENAL  Result Date: 07/27/2020 CLINICAL DATA:  Renal failure. EXAM: RENAL / URINARY TRACT ULTRASOUND COMPLETE COMPARISON:  CT 04/08/2020 FINDINGS: Right Kidney: Surgically absent. Left Kidney: Renal measurements: 9.6 x 5.4 x 4.8 cm = volume: 130 mL. Echogenicity within normal limits. No mass or hydronephrosis visualized. Bladder: Empty and not well assessed. Other: Incidental right pleural effusion. IMPRESSION: 1. Unremarkable sonographic appearance of the left kidney. 2. Right nephrectomy. 3. Urinary bladder is empty and not well assessed. 4. Right pleural effusion incidentally noted. Electronically Signed   By: Keith Rake M.D.   On: 07/27/2020 04:04   IR Fluoro Guide CV Line Right  Result Date: 07/27/2020 CLINICAL DATA:  Acute kidney injury, uremia and need for emergent hemodialysis. Request has been made to place a tunneled hemodialysis catheter due to predicted need for longer term hemodialysis. EXAM: TUNNELED CENTRAL VENOUS HEMODIALYSIS CATHETER PLACEMENT WITH ULTRASOUND AND FLUOROSCOPIC GUIDANCE ANESTHESIA/SEDATION: 2.0 mg IV Versed; 50 mcg IV Fentanyl. Total Moderate Sedation Time:   20 minutes. The patient's level of consciousness and physiologic status were continuously monitored during the procedure by Radiology nursing. MEDICATIONS: 2 g IV Ancef. FLUOROSCOPY TIME:  24 seconds.  2.0 mGy. PROCEDURE: The procedure, risks, benefits, and alternatives were explained to the patient. Questions regarding the procedure were encouraged and answered. The patient understands and consents to the procedure. A  timeout was performed prior to initiating the procedure. The right neck and chest were prepped with chlorhexidine in a sterile fashion, and a sterile drape was applied covering the operative field. Maximum barrier sterile technique with sterile gowns and gloves were used for the procedure. Local anesthesia was provided with 1% lidocaine. Ultrasound was used to confirm patency of the right internal jugular vein. After creating a small venotomy incision, a 21 gauge needle was advanced into the right internal jugular vein under direct, real-time ultrasound guidance. Ultrasound image documentation was performed. After securing guidewire access, an 8 Fr dilator was placed. A J-wire was kinked to  measure appropriate catheter length. A Palindrome tunneled hemodialysis catheter measuring 19 cm from tip to cuff was chosen for placement. This was tunneled in a retrograde fashion from the chest wall to the venotomy incision. At the venotomy, serial dilatation was performed and a 15 Fr peel-away sheath was placed over a guidewire. The catheter was then placed through the sheath and the sheath removed. Final catheter positioning was confirmed and documented with a fluoroscopic spot image. The catheter was aspirated, flushed with saline, and injected with appropriate volume heparin dwells. The venotomy incision was closed with subcuticular 4-0 Vicryl. Dermabond was applied to the incision. The catheter exit site was secured with 0-Prolene retention sutures. COMPLICATIONS: None.  No pneumothorax. FINDINGS: After catheter placement, the tip lies in the right atrium. The catheter aspirates normally and is ready for immediate use. IMPRESSION: Placement of tunneled hemodialysis catheter via the right internal jugular vein. The catheter tip lies in the right atrium. The catheter is ready for immediate use. Electronically Signed   By: Aletta Edouard M.D.   On: 07/27/2020 15:00   IR US Guide Vasc Access Right  Result Date:  07/27/2020 CLINICAL DATA:  Acute kidney injury, uremia and need for emergent hemodialysis. Request has been made to place a tunneled hemodialysis catheter due to predicted need for longer term hemodialysis. EXAM: TUNNELED CENTRAL VENOUS HEMODIALYSIS CATHETER PLACEMENT WITH ULTRASOUND AND FLUOROSCOPIC GUIDANCE ANESTHESIA/SEDATION: 2.0 mg IV Versed; 50 mcg IV Fentanyl. Total Moderate Sedation Time:   20 minutes. The patient's level of consciousness and physiologic status were continuously monitored during the procedure by Radiology nursing. MEDICATIONS: 2 g IV Ancef. FLUOROSCOPY TIME:  24 seconds.  2.0 mGy. PROCEDURE: The procedure, risks, benefits, and alternatives were explained to the patient. Questions regarding the procedure were encouraged and answered. The patient understands and consents to the procedure. A timeout was performed prior to initiating the procedure. The right neck and chest were prepped with chlorhexidine in a sterile fashion, and a sterile drape was applied covering the operative field. Maximum barrier sterile technique with sterile gowns and gloves were used for the procedure. Local anesthesia was provided with 1% lidocaine. Ultrasound was used to confirm patency of the right internal jugular vein. After creating a small venotomy incision, a 21 gauge needle was advanced into the right internal jugular vein under direct, real-time ultrasound guidance. Ultrasound image documentation was performed. After securing guidewire access, an 8 Fr dilator was placed. A J-wire was kinked to measure appropriate catheter length. A Palindrome tunneled hemodialysis catheter measuring 19 cm from tip to cuff was chosen for placement. This was tunneled in a retrograde fashion from the chest wall to the venotomy incision. At the venotomy, serial dilatation was performed and a 15 Fr peel-away sheath was placed over a guidewire. The catheter was then placed through the sheath and the sheath removed. Final catheter  positioning was confirmed and documented with a fluoroscopic spot image. The catheter was aspirated, flushed with saline, and injected with appropriate volume heparin dwells. The venotomy incision was closed with subcuticular 4-0 Vicryl. Dermabond was applied to the incision. The catheter exit site was secured with 0-Prolene retention sutures. COMPLICATIONS: None.  No pneumothorax. FINDINGS: After catheter placement, the tip lies in the right atrium. The catheter aspirates normally and is ready for immediate use. IMPRESSION: Placement of tunneled hemodialysis catheter via the right internal jugular vein. The catheter tip lies in the right atrium. The catheter is ready for immediate use. Electronically Signed   By: Jenness Corner.D.  On: 07/27/2020 15:00     Time Spent in minutes  30     Desiree Hane M.D on 08/04/2020 at 12:21 PM  To page go to [email or web address] - password [email or web address]

## 2020-08-04 NOTE — Progress Notes (Signed)
[facility] Progress Note   79 y.o.female HTN, HLD, HCV, COPD, rt nephrectomy in 2018 for RCC p/w  nausea and vomiting and AKI on CKD  Assessment/ Plan:   1. Severe oliguric AKI on CKD3: S/p rt nephrectomy BL Cr 1.3-1.6 range in recent months presumably chronic component secondary to HTN.  No obvious obstruction. Not a biopsy candidate given solitary kidney.  Initiated dialysis on 8/14.  Serologies without obvious cause of kidney dysfunction but most likely ATN -TDC in place -Continue with dialysis today maintaining MWF schedule; next dialysis tomorrow -Consider AVF/AVG placement next week if the patient continues to show no signs of recovery over the weekend - Avoid contrast and other nephrotoxins if possible. - Renal dose medications for a GFR under 15 ml/min. -Start oral Lasix 80 mg twice daily given possible pulmonary edema - Strict I&O's, daily weights   2. Anemia  -Status post 1000 mg of iron -Aranesp 28mcg weekly  3. HTN -Continue hydralazine 100 mg 3 times daily, carvedilol 12.5 mg twice daily, amlodipine 10 mg daily 4. Remote h/o HCV 5. Cough: Possibly related to pulmonary edema.  No fevers but could consider Covid.  Will start diuretics as above  Subjective:   Patient complaining of cough minimally productive at this time.  Mild shortness of breath.  Appetite not as good today.   Objective:   BP (!) 127/49 (BP Location: Right Arm)    Pulse 77    Temp 98.2 F (36.8 C) (Oral)    Resp 20    Ht 5\' 2"  (1.575 m)    Wt 73.8 kg    SpO2 91%    BMI 29.76 kg/m   Intake/Output Summary (Last 24 hours) at 08/04/2020 5638 Last data filed at 08/04/2020 7564 Gross per 24 hour  Intake 640 ml  Output 300 ml  Net 340 ml   Weight change:   Physical Exam: GEN: NAD, sitting in bed HEENT: No conjunctival pallor, no nasal discharge LUNGS: Bilateral chest rise with no increased work of breathing CV: Normal rate, no audible murmurs ABD: SNDNT +BS  EXT:  Trace edema in  her bilateral lower extremities  Imaging: No results found.  Labs: BMET Recent Labs  Lab 07/29/20 1137 07/31/20 1200  NA 140 139  K 3.9 3.7  CL 105 103  CO2 24 27  GLUCOSE 94 92  BUN 67* 37*  CREATININE 7.14* 3.83*  CALCIUM 8.1* 8.4*  PHOS 6.3* 3.6   CBC Recent Labs  Lab 07/31/20 1056 07/31/20 1056 07/31/20 1200 08/02/20 1011 08/03/20 0814 08/04/20 0133  WBC 12.0*   < > 11.7* 10.1 8.9 9.0  NEUTROABS 9.3*  --   --  8.1*  --  6.6  HGB 9.8*   < > 10.1* 9.5* 8.6* 9.3*  HCT 31.7*   < > 32.2* 29.4* 27.5* 28.7*  MCV 97.5   < > 96.1 95.8 95.8 96.0  PLT 107*   < > 108* 141* 164 221   < > = values in this interval not displayed.    Medications:     amLODipine  10 mg Oral Daily   carvedilol  12.5 mg Oral BID WC   Chlorhexidine Gluconate Cloth  6 each Topical Q0600   [START ON 08/05/2020] darbepoetin (ARANESP) injection - DIALYSIS  40 mcg Intravenous Q Mon-HD   furosemide  80 mg Oral BID   heparin  5,000 Units Subcutaneous Q8H   hydrALAZINE  100 mg Oral Q8H   multivitamin with minerals  1 tablet Oral  Daily   rosuvastatin  10 mg Oral Daily      Reesa Chew  08/04/2020, 9:39 AM

## 2020-08-05 ENCOUNTER — Inpatient Hospital Stay (HOSPITAL_COMMUNITY): Payer: Medicare Other

## 2020-08-05 DIAGNOSIS — N186 End stage renal disease: Secondary | ICD-10-CM

## 2020-08-05 LAB — RENAL FUNCTION PANEL
Albumin: 2.2 g/dL — ABNORMAL LOW (ref 3.5–5.0)
Anion gap: 12 (ref 5–15)
BUN: 35 mg/dL — ABNORMAL HIGH (ref 8–23)
CO2: 26 mmol/L (ref 22–32)
Calcium: 8.8 mg/dL — ABNORMAL LOW (ref 8.9–10.3)
Chloride: 93 mmol/L — ABNORMAL LOW (ref 98–111)
Creatinine, Ser: 5.53 mg/dL — ABNORMAL HIGH (ref 0.44–1.00)
GFR calc Af Amer: 8 mL/min — ABNORMAL LOW (ref >60)
GFR calc non Af Amer: 7 mL/min — ABNORMAL LOW (ref >60)
Glucose, Bld: 104 mg/dL — ABNORMAL HIGH (ref 70–99)
Phosphorus: 5.1 mg/dL — ABNORMAL HIGH (ref 2.5–4.6)
Potassium: 3.6 mmol/L (ref 3.5–5.1)
Sodium: 131 mmol/L — ABNORMAL LOW (ref 135–145)

## 2020-08-05 LAB — CBC WITH DIFFERENTIAL/PLATELET
Abs Immature Granulocytes: 0.03 10*3/uL (ref 0.00–0.07)
Basophils Absolute: 0 10*3/uL (ref 0.0–0.1)
Basophils Relative: 0 %
Eosinophils Absolute: 0.1 10*3/uL (ref 0.0–0.5)
Eosinophils Relative: 2 %
HCT: 27.2 % — ABNORMAL LOW (ref 36.0–46.0)
Hemoglobin: 8.8 g/dL — ABNORMAL LOW (ref 12.0–15.0)
Immature Granulocytes: 0 %
Lymphocytes Relative: 8 %
Lymphs Abs: 0.7 10*3/uL (ref 0.7–4.0)
MCH: 30.8 pg (ref 26.0–34.0)
MCHC: 32.4 g/dL (ref 30.0–36.0)
MCV: 95.1 fL (ref 80.0–100.0)
Monocytes Absolute: 1.5 10*3/uL — ABNORMAL HIGH (ref 0.1–1.0)
Monocytes Relative: 18 %
Neutro Abs: 6 10*3/uL (ref 1.7–7.7)
Neutrophils Relative %: 72 %
Platelets: 238 10*3/uL (ref 150–400)
RBC: 2.86 MIL/uL — ABNORMAL LOW (ref 3.87–5.11)
RDW: 14.4 % (ref 11.5–15.5)
WBC: 8.3 10*3/uL (ref 4.0–10.5)
nRBC: 0 % (ref 0.0–0.2)

## 2020-08-05 MED ORDER — HEPARIN SODIUM (PORCINE) 1000 UNIT/ML IJ SOLN
INTRAMUSCULAR | Status: AC
  Administered 2020-08-05: 3200 [IU]
  Filled 2020-08-05: qty 4

## 2020-08-05 MED ORDER — DARBEPOETIN ALFA 40 MCG/0.4ML IJ SOSY
PREFILLED_SYRINGE | INTRAMUSCULAR | Status: AC
  Filled 2020-08-05: qty 0.4

## 2020-08-05 NOTE — Progress Notes (Signed)
Upper extremity vein mapping study completed.   Please see CV Proc for preliminary results.   Cassandra Dyer

## 2020-08-05 NOTE — Plan of Care (Signed)
  Problem: Education: Goal: Knowledge of disease and its progression will improve Outcome: Progressing   Problem: Health Behavior/Discharge Planning: Goal: Ability to manage health-related needs will improve Outcome: Progressing   Problem: Clinical Measurements: Goal: Complications related to the disease process or treatment will be avoided or minimized Outcome: Progressing Goal: Dialysis access will remain free of complications Outcome: Progressing   Problem: Activity: Goal: Activity intolerance will improve Outcome: Progressing   Problem: Fluid Volume: Goal: Fluid volume balance will be maintained or improved Outcome: Progressing   Problem: Nutritional: Goal: Ability to make appropriate dietary choices will improve Outcome: Progressing   Problem: Respiratory: Goal: Respiratory symptoms related to disease process will be avoided Outcome: Progressing   Problem: Self-Concept: Goal: Body image disturbance will be avoided or minimized Outcome: Progressing   Problem: Urinary Elimination: Goal: Progression of disease will be identified and treated Outcome: Progressing   Problem: Education: Goal: Knowledge of General Education information will improve Description: Including pain rating scale, medication(s)/side effects and non-pharmacologic comfort measures Outcome: Progressing   Problem: Health Behavior/Discharge Planning: Goal: Ability to manage health-related needs will improve Outcome: Progressing   Problem: Clinical Measurements: Goal: Ability to maintain clinical measurements within normal limits will improve Outcome: Progressing Goal: Will remain free from infection Outcome: Progressing Goal: Diagnostic test results will improve Outcome: Progressing Goal: Respiratory complications will improve Outcome: Progressing Goal: Cardiovascular complication will be avoided Outcome: Progressing   Problem: Activity: Goal: Risk for activity intolerance will  decrease Outcome: Progressing   Problem: Nutrition: Goal: Adequate nutrition will be maintained Outcome: Progressing   Problem: Coping: Goal: Level of anxiety will decrease Outcome: Progressing   Problem: Elimination: Goal: Will not experience complications related to bowel motility Outcome: Progressing Goal: Will not experience complications related to urinary retention Outcome: Progressing   Problem: Pain Managment: Goal: General experience of comfort will improve Outcome: Progressing   Problem: Safety: Goal: Ability to remain free from injury will improve Outcome: Progressing   Problem: Skin Integrity: Goal: Risk for impaired skin integrity will decrease Outcome: Progressing

## 2020-08-05 NOTE — Progress Notes (Addendum)
[facility] NEPHROLOGY PROGRESS NOTE  Assessment/ Plan: Pt is a 79 y.o. yo female  HTN, HLD, HCV, COPD, rt nephrectomy in 2018 for RCC p/w nausea and vomiting and AKI on CKD.  # Severe oliguric AKI on CKD3: S/p rt nephrectomy BL Cr 1.3-1.6 range in recent months presumably chronic component secondary to HTN.  No obvious obstruction. Not a biopsy candidate given solitary kidney.  Initiated dialysis on 8/14.  Serologies without obvious cause of kidney dysfunction but most likely ATN. Receiving dialysis MWF schedule via [facility].  She will likely need permanent access placement which was discussed with the patient today.  I will order vein mapping.  Discontinue Lasix. She will need arrangement for outpatient HD.  #Anemia of CKD: Received IV iron.  Continue Aranesp.  Monitor hemoglobin.  #Hypertension/volume: Continue current antihypertensive medication.  Manage volume by dialysis.  #Hyperphosphatemia: Phosphorus level is improving.  I will check PTH level.  #Acute diarrhea present on admission: Improving now.  Subjective: Seen and examined.  Receiving dialysis.  Denies nausea vomiting chest pain shortness of breath. Objective Vital signs in last 24 hours: Vitals:   08/05/20 0930 08/05/20 1000 08/05/20 1030 08/05/20 1130  BP: (!) 149/64 (!) 146/62 (!) 147/64 (!) 147/53  Pulse: 74 76 79 87  Resp:    16  Temp:    98.1 F (36.7 C)  TempSrc:      SpO2:    98%  Weight:      Height:       Weight change:   Intake/Output Summary (Last 24 hours) at 08/05/2020 1133 Last data filed at 08/04/2020 1800 Gross per 24 hour  Intake 440 ml  Output 500 ml  Net -60 ml       Labs: Basic Metabolic Panel: Recent Labs  Lab 07/29/20 1137 07/31/20 1200 08/05/20 0657  NA 140 139 131*  K 3.9 3.7 3.6  CL 105 103 93*  CO2 24 27 26   GLUCOSE 94 92 104*  BUN 67* 37* 35*  CREATININE 7.14* 3.83* 5.53*  CALCIUM 8.1* 8.4* 8.8*  PHOS 6.3* 3.6 5.1*   Liver Function Tests: Recent Labs   Lab 07/29/20 1137 07/31/20 1200 08/05/20 0657  ALBUMIN 2.3* 2.3* 2.2*   No results for input(s): LIPASE, AMYLASE in the last 168 hours. No results for input(s): AMMONIA in the last 168 hours. CBC: Recent Labs  Lab 07/31/20 1200 07/31/20 1200 08/02/20 1011 08/02/20 1011 08/03/20 0814 08/04/20 0133 08/05/20 0035  WBC 11.7*   < > 10.1   < > 8.9 9.0 8.3  NEUTROABS  --    < > 8.1*  --   --  6.6 6.0  HGB 10.1*   < > 9.5*   < > 8.6* 9.3* 8.8*  HCT 32.2*   < > 29.4*   < > 27.5* 28.7* 27.2*  MCV 96.1  --  95.8  --  95.8 96.0 95.1  PLT 108*   < > 141*   < > 164 221 238   < > = values in this interval not displayed.   Cardiac Enzymes: No results for input(s): CKTOTAL, CKMB, CKMBINDEX, TROPONINI in the last 168 hours. CBG: No results for input(s): GLUCAP in the last 168 hours.  Iron Studies: No results for input(s): IRON, TIBC, TRANSFERRIN, FERRITIN in the last 72 hours. Studies/Results: DG Chest 2 View  Result Date: 08/04/2020 CLINICAL DATA:  Shortness of breath. EXAM: CHEST - 2 VIEW COMPARISON:  07/26/2020 chest radiograph FINDINGS: Small-moderate bilateral pleural effusions and bibasilar atelectasis  again noted with slight decrease in LEFT pleural effusion. The visualized cardiopericardial silhouette is unremarkable. A RIGHT IJ central venous catheter with tip overlying the SUPERIOR cavoatrial junction is noted. There is no evidence of pneumothorax IMPRESSION: Small-moderate bilateral pleural effusions and bibasilar atelectasis again noted with slight decrease in LEFT pleural effusion. Electronically Signed   By: Margarette Canada M.D.   On: 08/04/2020 14:53    Medications: Infusions:   Scheduled Medications: . amLODipine  10 mg Oral Daily  . carvedilol  12.5 mg Oral BID WC  . Chlorhexidine Gluconate Cloth  6 each Topical Q0600  . darbepoetin (ARANESP) injection - DIALYSIS  40 mcg Intravenous Q Mon-HD  . furosemide  80 mg Oral BID  . heparin  5,000 Units Subcutaneous Q8H  .  hydrALAZINE  100 mg Oral Q8H  . multivitamin with minerals  1 tablet Oral Daily  . rosuvastatin  10 mg Oral Daily    have reviewed scheduled and prn medications.  Physical Exam: General:NAD, comfortable Heart:RRR, s1s2 nl Lungs:clear b/l, no crackle Abdomen:soft, Non-tender, non-distended Extremities:No edema Dialysis Access: Right IJ TDC placed by IR on 07/27/2020.  Keyan Folson Prasad Haeden Hudock 08/05/2020,11:33 AM  LOS: 9 days  Pager: [phone]

## 2020-08-05 NOTE — Progress Notes (Signed)
Renal Navigator still awaiting OP HD seat acceptance/schedule and requested update. Navigator will continue to follow.  Alphonzo Cruise, [place] Renal Navigator [phone]

## 2020-08-05 NOTE — Progress Notes (Signed)
[facility]  PROGRESS NOTE  Cassandra Dyer:[id number] DOB: [birth year] DOA: 07/26/2020 PCP: Biagio Borg, MD Admit date - 07/26/2020   Admitting Physician Vernelle Emerald, MD  Outpatient Primary MD for the patient is Biagio Borg, MD  LOS - 9 Brief Narrative   Cassandra Dyer is a 79 y.o. year old female with medical history significant for HTN, HLD, CKD stage III, hepatitis C, chronic anemia, COPD, right nephrectomy in 2018 for renal cell cancer who presented on 07/26/2020 with shortness of breath, vomiting, diarrhea and generalized weakness for the past week, as well as intermittent episodes of lower extremity swelling was found to have creatinine of 12 (previous baseline of 1.3), potassium of 7.2, CO2 12, GFR 3, white count 12.8 concerning for AKI on CKD  Hospital course complicated by initiation of dialysis on 8/14  Subjective  Tolerated HD this am. No further nausea. Feels cough is improved  A & P   Severe oliguric AKI on CKD stage III in setting of prior remote right nephrectomy.  Unclear if this is progression of CKD likely due to HTN, possible acute exacerbation related to preceding symptoms of nausea, vomiting, diarrhea though that was only for short period of time.  Suspect the symptoms were likely uremic related.  Renal ultrasound showed no obvious obstruction, has not responded well to Lasix, not a biopsy candidate given solitary kidney per nephrology.  ANA positive, C3 mildly decreased otherwise normal serologies -Continue dialysis via [facility], Monday Wednesday Friday -Considering aVF/AVG per nephrology--she will likely need permanent access -Avoid nephrotoxins, renally dose medications for GFR less than 15 -Daily weights, strict I's and O's  Thrombocytopenia, resolved.  On presentation on 8/13 platelet count of 240 2K.  Had previous episodes of thrombocytopenia back in April 2021 with nadir of 70 K.   Previously 108 8K on 8/19, now wnl, no signs or symptoms of  bleeding -Monitor CBC  Hyperkalemia, resolved.  With calcium gluconate, Lokelma, insulin, bicarb on admission.  Normocytic anemia, hemoglobin stable.  Iron panel consistent with iron deficiency -Continue iron load  Dyspnea and cough, improving.  Has COPD but no active wheezing.  Has improved with dialysis, likely due to volume overload.  Small pleural effusions noted on admitting chest x-ray, and on 2 L O2. CXR on 8/22 with small bilateral effusions ( left decreased from prior imaging) cough better after IV lasix on 8/22 and HD on 8/23 --No further lasix needed --continue with HD as directed by nephrology -Monitor respiratory status  HTN, improved control , at goal  -Hydralazine 100 mg 3 times daily -Discontinued metoprolol in favor of Coreg 12.5 mg twice daily -Continue amlodipine 10 mg daily -Discontinued home ARB due to AKI  Leukocytosis, mild, resolved.  likely stress leukocytosis.  Remains afebrile -Monitor CBC  HLD, stable -Continue Crestor  Acute diarrhea, present on admission, resolved.  Per patient ongoing 3 to 4 days prior to presentation, potential nidus of AKI  History of COPD, no wheezing on exam -As needed albuterol inhaler     Family Communication  : None  Code Status : DNR, discussed on day of admission  Disposition Plan  :  Patient is from home. Anticipated d/c date: 2 to 3 days. Barriers to d/c or necessity for inpatient status:  Continue monitoring of creatinine/kidney function, dialysis needs, volume status Consults  : Nephrology, IR  Procedures  :    DVT Prophylaxis  : Heparin  Lab Results  Component Value Date   PLT 238 08/05/2020  Diet :  Diet Order            Diet renal with fluid restriction Room service appropriate? Yes; Fluid consistency: Thin  Diet effective now                  Inpatient Medications Scheduled Meds:  amLODipine  10 mg Oral Daily   carvedilol  12.5 mg Oral BID WC   Chlorhexidine Gluconate Cloth  6 each  Topical Q0600   darbepoetin (ARANESP) injection - DIALYSIS  40 mcg Intravenous Q Mon-HD   heparin  5,000 Units Subcutaneous Q8H   hydrALAZINE  100 mg Oral Q8H   multivitamin with minerals  1 tablet Oral Daily   rosuvastatin  10 mg Oral Daily   Continuous Infusions:  PRN Meds:.acetaminophen **OR** acetaminophen, hydrALAZINE, ondansetron **OR** ondansetron (ZOFRAN) IV, polyethylene glycol  Antibiotics  :   Anti-infectives (From admission, onward)   Start     Dose/Rate Route Frequency Ordered Stop   07/27/20 1253  ceFAZolin (ANCEF) IVPB 2g/100 mL premix        over 30 Minutes Intravenous Continuous PRN 07/27/20 1253 07/27/20 1253   07/27/20 1244  ceFAZolin (ANCEF) 2-4 GM/100ML-% IVPB       Note to Pharmacy: Margaretmary Dys   : cabinet override      07/27/20 1244 07/28/20 0044       Objective   Vitals:   08/05/20 1030 08/05/20 1038 08/05/20 1130 08/05/20 1434  BP: (!) 147/64 (!) 156/60 (!) 147/53 (!) 122/47  Pulse: 79 81 87   Resp:  12 16   Temp:  97.8 F (36.6 C) 98.1 F (36.7 C)   TempSrc:  Oral    SpO2:  100% 98%   Weight:  68.1 kg    Height:        SpO2: 98 % O2 Flow Rate (L/min): 2 L/min  Wt Readings from Last 3 Encounters:  08/05/20 68.1 kg  05/23/20 64.4 kg  04/26/20 66.7 kg     Intake/Output Summary (Last 24 hours) at 08/05/2020 1533 Last data filed at 08/05/2020 1038 Gross per 24 hour  Intake 200 ml  Output 3200 ml  Net -3000 ml    Physical Exam:     Awake Alert, Oriented X 3, Normal affect No new F.N deficits,  [place].AT, Normal respiratory effort on 2L, decreased breath sounds throughout Dialysis catheter in right chest wall, clean, dry Diffuse nonpitting edema of bilateral upper and lower extremities, continues  to be improving RRR,No Gallops,Rubs or new Murmurs,  +ve B.Sounds, Abd Soft, No tenderness, No rebound, guarding or rigidity.    I have personally reviewed the following:   Data Reviewed:  CBC Recent Labs  Lab 07/31/20 1056  07/31/20 1056 07/31/20 1200 08/02/20 1011 08/03/20 0814 08/04/20 0133 08/05/20 0035  WBC 12.0*   < > 11.7* 10.1 8.9 9.0 8.3  HGB 9.8*   < > 10.1* 9.5* 8.6* 9.3* 8.8*  HCT 31.7*   < > 32.2* 29.4* 27.5* 28.7* 27.2*  PLT 107*   < > 108* 141* 164 221 238  MCV 97.5   < > 96.1 95.8 95.8 96.0 95.1  MCH 30.2   < > 30.1 30.9 30.0 31.1 30.8  MCHC 30.9   < > 31.4 32.3 31.3 32.4 32.4  RDW 16.8*   < > 16.6* 15.8* 15.3 14.9 14.4  LYMPHSABS 0.8  --   --  0.4*  --  0.6* 0.7  MONOABS 1.6*  --   --  1.4*  --  1.5* 1.5*  EOSABS 0.2  --   --  0.1  --  0.2 0.1  BASOSABS 0.0  --   --  0.0  --  0.0 0.0   < > = values in this interval not displayed.    Chemistries  Recent Labs  Lab 07/31/20 1200 08/05/20 0657  NA 139 131*  K 3.7 3.6  CL 103 93*  CO2 27 26  GLUCOSE 92 104*  BUN 37* 35*  CREATININE 3.83* 5.53*  CALCIUM 8.4* 8.8*   ------------------------------------------------------------------------------------------------------------------ No results for input(s): CHOL, HDL, LDLCALC, TRIG, CHOLHDL, LDLDIRECT in the last 72 hours.  Lab Results  Component Value Date   HGBA1C 5.6 04/08/2020   ------------------------------------------------------------------------------------------------------------------ No results for input(s): TSH, T4TOTAL, T3FREE, THYROIDAB in the last 72 hours.  Invalid input(s): FREET3 ------------------------------------------------------------------------------------------------------------------ No results for input(s): VITAMINB12, FOLATE, FERRITIN, TIBC, IRON, RETICCTPCT in the last 72 hours.  Coagulation profile No results for input(s): INR, PROTIME in the last 168 hours.  No results for input(s): DDIMER in the last 72 hours.  Cardiac Enzymes No results for input(s): CKMB, TROPONINI, MYOGLOBIN in the last 168 hours.  Invalid input(s): CK ------------------------------------------------------------------------------------------------------------------ No  results found for: BNP  Micro Results Recent Results (from the past 240 hour(s))  SARS Coronavirus 2 by RT PCR (hospital order, performed in [facility] hospital lab) Nasopharyngeal Nasopharyngeal Swab     Status: None   Collection Time: 07/26/20  5:31 PM   Specimen: Nasopharyngeal Swab  Result Value Ref Range Status   SARS Coronavirus 2 NEGATIVE NEGATIVE Final    Comment: (NOTE) SARS-CoV-2 target nucleic acids are NOT DETECTED.  The SARS-CoV-2 RNA is generally detectable in upper and lower respiratory specimens during the acute phase of infection. The lowest concentration of SARS-CoV-2 viral copies this assay can detect is 250 copies / mL. A negative result does not preclude SARS-CoV-2 infection and should not be used as the sole basis for treatment or other patient management decisions.  A negative result may occur with improper specimen collection / handling, submission of specimen other than nasopharyngeal swab, presence of viral mutation(s) within the areas targeted by this assay, and inadequate number of viral copies (<250 copies / mL). A negative result must be combined with clinical observations, patient history, and epidemiological information.  Fact Sheet for Patients:   StrictlyIdeas.no  Fact Sheet for Healthcare Providers: BankingDealers.co.za  This test is not yet approved or  cleared by the [place] FDA and has been authorized for detection and/or diagnosis of SARS-CoV-2 by FDA under an Emergency Use Authorization (EUA).  This EUA will remain in effect (meaning this test can be used) for the duration of the COVID-19 declaration under Section 564(b)(1) of the Act, 21 U.S.C. section 360bbb-3(b)(1), unless the authorization is terminated or revoked sooner.  Performed at [facility], [place] [address], [place], [place] [address]   MRSA PCR Screening     Status: None   Collection Time: 07/27/20  8:25 AM    Specimen: Nasal Mucosa; Nasopharyngeal  Result Value Ref Range Status   MRSA by PCR NEGATIVE NEGATIVE Final    Comment:        The GeneXpert MRSA Assay (FDA approved for NASAL specimens only), is one component of a comprehensive MRSA colonization surveillance program. It is not intended to diagnose MRSA infection nor to guide or monitor treatment for MRSA infections. Performed at [facility], [place] [address] [place], [place] [address]   Gastrointestinal Panel by PCR , Stool  Status: None   Collection Time: 07/27/20 10:00 AM   Specimen: Stool  Result Value Ref Range Status   Campylobacter species NOT DETECTED NOT DETECTED Final   Plesimonas shigelloides NOT DETECTED NOT DETECTED Final   Salmonella species NOT DETECTED NOT DETECTED Final   Yersinia enterocolitica NOT DETECTED NOT DETECTED Final   Vibrio species NOT DETECTED NOT DETECTED Final   Vibrio cholerae NOT DETECTED NOT DETECTED Final   Enteroaggregative E coli (EAEC) NOT DETECTED NOT DETECTED Final   Enteropathogenic E coli (EPEC) NOT DETECTED NOT DETECTED Final   Enterotoxigenic E coli (ETEC) NOT DETECTED NOT DETECTED Final   Shiga like toxin producing E coli (STEC) NOT DETECTED NOT DETECTED Final   Shigella/Enteroinvasive E coli (EIEC) NOT DETECTED NOT DETECTED Final   Cryptosporidium NOT DETECTED NOT DETECTED Final   Cyclospora cayetanensis NOT DETECTED NOT DETECTED Final   Entamoeba histolytica NOT DETECTED NOT DETECTED Final   Giardia lamblia NOT DETECTED NOT DETECTED Final   Adenovirus F40/41 NOT DETECTED NOT DETECTED Final   Astrovirus NOT DETECTED NOT DETECTED Final   Norovirus GI/GII NOT DETECTED NOT DETECTED Final   Rotavirus A NOT DETECTED NOT DETECTED Final   Sapovirus (I, II, IV, and V) NOT DETECTED NOT DETECTED Final    Comment: Performed at [facility], [address], [place], [place] [address]    Radiology Reports DG Chest 1 View  Result Date: 07/26/2020 CLINICAL DATA:   79 year old female with shortness of breath. EXAM: CHEST  1 VIEW COMPARISON:  Chest radiograph dated 04/26/2020 FINDINGS: Small bilateral pleural effusions with bibasilar atelectasis, increased since the prior radiograph. Pneumonia is not excluded. No pneumothorax. The cardiac borders are silhouetted. Atherosclerotic calcification of the aorta. No acute osseous pathology. IMPRESSION: Small bilateral pleural effusions with bibasilar atelectasis, increased since the prior radiograph. Electronically Signed   By: Anner Crete M.D.   On: 07/26/2020 20:24   DG Chest 2 View  Result Date: 08/04/2020 CLINICAL DATA:  Shortness of breath. EXAM: CHEST - 2 VIEW COMPARISON:  07/26/2020 chest radiograph FINDINGS: Small-moderate bilateral pleural effusions and bibasilar atelectasis again noted with slight decrease in LEFT pleural effusion. The visualized cardiopericardial silhouette is unremarkable. A RIGHT IJ central venous catheter with tip overlying the SUPERIOR cavoatrial junction is noted. There is no evidence of pneumothorax IMPRESSION: Small-moderate bilateral pleural effusions and bibasilar atelectasis again noted with slight decrease in LEFT pleural effusion. Electronically Signed   By: Margarette Canada M.D.   On: 08/04/2020 14:53   US RENAL  Result Date: 07/27/2020 CLINICAL DATA:  Renal failure. EXAM: RENAL / URINARY TRACT ULTRASOUND COMPLETE COMPARISON:  CT 04/08/2020 FINDINGS: Right Kidney: Surgically absent. Left Kidney: Renal measurements: 9.6 x 5.4 x 4.8 cm = volume: 130 mL. Echogenicity within normal limits. No mass or hydronephrosis visualized. Bladder: Empty and not well assessed. Other: Incidental right pleural effusion. IMPRESSION: 1. Unremarkable sonographic appearance of the left kidney. 2. Right nephrectomy. 3. Urinary bladder is empty and not well assessed. 4. Right pleural effusion incidentally noted. Electronically Signed   By: Keith Rake M.D.   On: 07/27/2020 04:04   IR Fluoro Guide CV Line  Right  Result Date: 07/27/2020 CLINICAL DATA:  Acute kidney injury, uremia and need for emergent hemodialysis. Request has been made to place a tunneled hemodialysis catheter due to predicted need for longer term hemodialysis. EXAM: TUNNELED CENTRAL VENOUS HEMODIALYSIS CATHETER PLACEMENT WITH ULTRASOUND AND FLUOROSCOPIC GUIDANCE ANESTHESIA/SEDATION: 2.0 mg IV Versed; 50 mcg IV Fentanyl. Total Moderate Sedation Time:   20  minutes. The patient's level of consciousness and physiologic status were continuously monitored during the procedure by Radiology nursing. MEDICATIONS: 2 g IV Ancef. FLUOROSCOPY TIME:  24 seconds.  2.0 mGy. PROCEDURE: The procedure, risks, benefits, and alternatives were explained to the patient. Questions regarding the procedure were encouraged and answered. The patient understands and consents to the procedure. A timeout was performed prior to initiating the procedure. The right neck and chest were prepped with chlorhexidine in a sterile fashion, and a sterile drape was applied covering the operative field. Maximum barrier sterile technique with sterile gowns and gloves were used for the procedure. Local anesthesia was provided with 1% lidocaine. Ultrasound was used to confirm patency of the right internal jugular vein. After creating a small venotomy incision, a 21 gauge needle was advanced into the right internal jugular vein under direct, real-time ultrasound guidance. Ultrasound image documentation was performed. After securing guidewire access, an 8 Fr dilator was placed. A J-wire was kinked to measure appropriate catheter length. A Palindrome tunneled hemodialysis catheter measuring 19 cm from tip to cuff was chosen for placement. This was tunneled in a retrograde fashion from the chest wall to the venotomy incision. At the venotomy, serial dilatation was performed and a 15 Fr peel-away sheath was placed over a guidewire. The catheter was then placed through the sheath and the sheath  removed. Final catheter positioning was confirmed and documented with a fluoroscopic spot image. The catheter was aspirated, flushed with saline, and injected with appropriate volume heparin dwells. The venotomy incision was closed with subcuticular 4-0 Vicryl. Dermabond was applied to the incision. The catheter exit site was secured with 0-Prolene retention sutures. COMPLICATIONS: None.  No pneumothorax. FINDINGS: After catheter placement, the tip lies in the right atrium. The catheter aspirates normally and is ready for immediate use. IMPRESSION: Placement of tunneled hemodialysis catheter via the right internal jugular vein. The catheter tip lies in the right atrium. The catheter is ready for immediate use. Electronically Signed   By: Aletta Edouard M.D.   On: 07/27/2020 15:00   IR US Guide Vasc Access Right  Result Date: 07/27/2020 CLINICAL DATA:  Acute kidney injury, uremia and need for emergent hemodialysis. Request has been made to place a tunneled hemodialysis catheter due to predicted need for longer term hemodialysis. EXAM: TUNNELED CENTRAL VENOUS HEMODIALYSIS CATHETER PLACEMENT WITH ULTRASOUND AND FLUOROSCOPIC GUIDANCE ANESTHESIA/SEDATION: 2.0 mg IV Versed; 50 mcg IV Fentanyl. Total Moderate Sedation Time:   20 minutes. The patient's level of consciousness and physiologic status were continuously monitored during the procedure by Radiology nursing. MEDICATIONS: 2 g IV Ancef. FLUOROSCOPY TIME:  24 seconds.  2.0 mGy. PROCEDURE: The procedure, risks, benefits, and alternatives were explained to the patient. Questions regarding the procedure were encouraged and answered. The patient understands and consents to the procedure. A timeout was performed prior to initiating the procedure. The right neck and chest were prepped with chlorhexidine in a sterile fashion, and a sterile drape was applied covering the operative field. Maximum barrier sterile technique with sterile gowns and gloves were used for the  procedure. Local anesthesia was provided with 1% lidocaine. Ultrasound was used to confirm patency of the right internal jugular vein. After creating a small venotomy incision, a 21 gauge needle was advanced into the right internal jugular vein under direct, real-time ultrasound guidance. Ultrasound image documentation was performed. After securing guidewire access, an 8 Fr dilator was placed. A J-wire was kinked to measure appropriate catheter length. A Palindrome tunneled hemodialysis catheter measuring 19  cm from tip to cuff was chosen for placement. This was tunneled in a retrograde fashion from the chest wall to the venotomy incision. At the venotomy, serial dilatation was performed and a 15 Fr peel-away sheath was placed over a guidewire. The catheter was then placed through the sheath and the sheath removed. Final catheter positioning was confirmed and documented with a fluoroscopic spot image. The catheter was aspirated, flushed with saline, and injected with appropriate volume heparin dwells. The venotomy incision was closed with subcuticular 4-0 Vicryl. Dermabond was applied to the incision. The catheter exit site was secured with 0-Prolene retention sutures. COMPLICATIONS: None.  No pneumothorax. FINDINGS: After catheter placement, the tip lies in the right atrium. The catheter aspirates normally and is ready for immediate use. IMPRESSION: Placement of tunneled hemodialysis catheter via the right internal jugular vein. The catheter tip lies in the right atrium. The catheter is ready for immediate use. Electronically Signed   By: Aletta Edouard M.D.   On: 07/27/2020 15:00     Time Spent in minutes  30     Desiree Hane M.D on 08/05/2020 at 3:33 PM  To page go to [email or web address] - password [email or web address]

## 2020-08-05 NOTE — Procedures (Signed)
Patient was seen on dialysis and the procedure was supervised.  BFR 400  Via TDC BP is  146/62.   Patient appears to be tolerating treatment well.  Cassandra Dyer 08/05/2020

## 2020-08-06 DIAGNOSIS — N185 Chronic kidney disease, stage 5: Secondary | ICD-10-CM

## 2020-08-06 LAB — SURGICAL PCR SCREEN
MRSA, PCR: NEGATIVE
Staphylococcus aureus: NEGATIVE

## 2020-08-06 LAB — CBC WITH DIFFERENTIAL/PLATELET
Abs Immature Granulocytes: 0.03 10*3/uL (ref 0.00–0.07)
Basophils Absolute: 0 10*3/uL (ref 0.0–0.1)
Basophils Relative: 0 %
Eosinophils Absolute: 0.1 10*3/uL (ref 0.0–0.5)
Eosinophils Relative: 1 %
HCT: 28.5 % — ABNORMAL LOW (ref 36.0–46.0)
Hemoglobin: 9 g/dL — ABNORMAL LOW (ref 12.0–15.0)
Immature Granulocytes: 0 %
Lymphocytes Relative: 9 %
Lymphs Abs: 0.8 10*3/uL (ref 0.7–4.0)
MCH: 30.4 pg (ref 26.0–34.0)
MCHC: 31.6 g/dL (ref 30.0–36.0)
MCV: 96.3 fL (ref 80.0–100.0)
Monocytes Absolute: 1.8 10*3/uL — ABNORMAL HIGH (ref 0.1–1.0)
Monocytes Relative: 21 %
Neutro Abs: 5.9 10*3/uL (ref 1.7–7.7)
Neutrophils Relative %: 69 %
Platelets: 236 10*3/uL (ref 150–400)
RBC: 2.96 MIL/uL — ABNORMAL LOW (ref 3.87–5.11)
RDW: 14.5 % (ref 11.5–15.5)
WBC: 8.6 10*3/uL (ref 4.0–10.5)
nRBC: 0 % (ref 0.0–0.2)

## 2020-08-06 LAB — RENAL FUNCTION PANEL
Albumin: 2.4 g/dL — ABNORMAL LOW (ref 3.5–5.0)
Anion gap: 8 (ref 5–15)
BUN: 14 mg/dL (ref 8–23)
CO2: 28 mmol/L (ref 22–32)
Calcium: 8.4 mg/dL — ABNORMAL LOW (ref 8.9–10.3)
Chloride: 97 mmol/L — ABNORMAL LOW (ref 98–111)
Creatinine, Ser: 3.67 mg/dL — ABNORMAL HIGH (ref 0.44–1.00)
GFR calc Af Amer: 13 mL/min — ABNORMAL LOW (ref >60)
GFR calc non Af Amer: 11 mL/min — ABNORMAL LOW (ref >60)
Glucose, Bld: 109 mg/dL — ABNORMAL HIGH (ref 70–99)
Phosphorus: 2.9 mg/dL (ref 2.5–4.6)
Potassium: 3.9 mmol/L (ref 3.5–5.1)
Sodium: 133 mmol/L — ABNORMAL LOW (ref 135–145)

## 2020-08-06 LAB — PATHOLOGIST SMEAR REVIEW

## 2020-08-06 MED ORDER — SODIUM CHLORIDE 0.9 % IV SOLN
1.5000 g | INTRAVENOUS | Status: AC
  Administered 2020-08-07: 1.5 g via INTRAVENOUS
  Filled 2020-08-06: qty 1.5

## 2020-08-06 MED ORDER — SENNOSIDES-DOCUSATE SODIUM 8.6-50 MG PO TABS
2.0000 | ORAL_TABLET | Freq: Two times a day (BID) | ORAL | Status: DC
  Administered 2020-08-06 (×2): 2 via ORAL
  Filled 2020-08-06 (×4): qty 2

## 2020-08-06 MED ORDER — POLYETHYLENE GLYCOL 3350 17 G PO PACK
17.0000 g | PACK | Freq: Two times a day (BID) | ORAL | Status: DC
  Administered 2020-08-06 (×2): 17 g via ORAL
  Filled 2020-08-06 (×4): qty 1

## 2020-08-06 MED ORDER — CHLORHEXIDINE GLUCONATE CLOTH 2 % EX PADS
6.0000 | MEDICATED_PAD | Freq: Every day | CUTANEOUS | Status: DC
  Administered 2020-08-06 – 2020-08-07 (×2): 6 via TOPICAL

## 2020-08-06 NOTE — Consult Note (Addendum)
Hospital Consult    Reason for Consult: Permanent dialysis access Requesting Physician: Dr. Carolin Sicks MRN #:  [id number]  History of Present Illness: This is a 79 y.o. female seen in consultation for evaluation for permanent dialysis access placement.  Patient is requiring hemodialysis.  Right IJ tunneled dialysis catheter was placed by interventional radiology during this admission.  Patient states she is right arm dominant and would prefer access placement in her left arm.  She does not take any blood thinners.  She does not have a pacemaker.  She is willing to proceed with permanent dialysis access during this admission.  Vein mapping has been performed and results can be seen on patient's chart.  Past Medical History:  Diagnosis Date  . Anemia   . Hepatitis    hx of hep C - 10-20 years ago   . History of blood transfusion   . History of kidney cancer   . Hyperlipidemia   . Hypertension    not taken b/p med in 2-3 years md deceased never went to another md     Past Surgical History:  Procedure Laterality Date  . CATARACT EXTRACTION Bilateral    11/30 left /12/9 right  . IR FLUORO GUIDE CV LINE RIGHT  07/27/2020  . IR US GUIDE VASC ACCESS RIGHT  07/27/2020  . ROBOT ASSISTED LAPAROSCOPIC NEPHRECTOMY Right 01/13/2017   Procedure: XI ROBOTIC ASSISTED LAPAROSCOPIC RADICAL NEPHRECTOMY WITH LYSIS OF ADHESION;  Surgeon: Alexis Frock, MD;  Location: [facility];  Service: Urology;  Laterality: Right;    No Known Allergies  Prior to Admission medications   Medication Sig Start Date End Date Taking? Authorizing Provider  amLODipine (NORVASC) 10 MG tablet Take 1 tablet (10 mg total) by mouth daily. 04/19/20  Yes Biagio Borg, MD  Ascorbic Acid (VITAMIN C PO) Take 500 mg by mouth in the morning and at bedtime.    Yes [provider]  benzonatate (TESSALON) 200 MG capsule Take 1 capsule (200 mg total) by mouth 3 (three) times daily as needed. 07/22/20  Yes Marrian Salvage, FNP    Black Currant Seed Oil 500 MG CAPS Take 500 mg by mouth daily.   Yes [provider]  Calcium Carbonate-Vit D-Min (CALCIUM 1200 PO) Take 1,200 mg by mouth daily.    Yes [provider]  Cholecalciferol (VITAMIN D PO) Take 2,000 mcg by mouth See admin instructions. Takes 2000 mcg 3 times a week ( monday, wednesday and friday)   Yes [provider]  doxycycline (VIBRA-TABS) 100 MG tablet Take 1 tablet (100 mg total) by mouth 2 (two) times daily. 07/22/20  Yes Marrian Salvage, FNP  Garlic 0814 MG CAPS Take 2,000 mg by mouth daily.    Yes [provider]  hydrochlorothiazide (HYDRODIURIL) 25 MG tablet Take 1 tablet (25 mg total) by mouth daily. 04/19/20  Yes Biagio Borg, MD  metoprolol succinate (TOPROL-XL) 100 MG 24 hr tablet Take 1 tablet (100 mg total) by mouth daily. Take with or immediately following a meal. 04/19/20  Yes Biagio Borg, MD  Multiple Vitamin (MULTIVITAMIN WITH MINERALS) TABS tablet Take 1 tablet by mouth daily.   Yes [provider]  Omega-3 Fatty Acids (FISH OIL PO) Take 1 tablet by mouth daily.   Yes [provider]  ondansetron (ZOFRAN) 4 MG tablet Take 1 tablet (4 mg total) by mouth every 8 (eight) hours as needed for nausea or vomiting. 04/19/20  Yes Biagio Borg, MD  OVER THE COUNTER  MEDICATION Take 1 capsule by mouth daily. Mega juice   Yes [provider]  rosuvastatin (CRESTOR) 10 MG tablet Take 1 tablet by mouth once daily Patient taking differently: Take 10 mg by mouth daily.  07/22/20  Yes Biagio Borg, MD  telmisartan (MICARDIS) 80 MG tablet Take 1 tablet (80 mg total) by mouth daily. 04/26/20  Yes Biagio Borg, MD    Social History   Socioeconomic History  . Marital status: Significant Other    Spouse name: Not on file  . Number of children: 0  . Years of education: 9  . Highest education level: Not on file  Occupational History  . Occupation: Retired  Tobacco Use  . Smoking status: Former  Smoker    Packs/day: 0.50    Years: 30.00    Pack years: 15.00    Types: Cigarettes    Quit date: 12/14/2000    Years since quitting: 19.6  . Smokeless tobacco: Never Used  Vaping Use  . Vaping Use: Never used  Substance and Sexual Activity  . Alcohol use: Yes    Comment: occasional wine   . Drug use: No  . Sexual activity: Not Currently  Other Topics Concern  . Not on file  Social History Narrative   Fun/Hobby: Watching TV and crossword puzzles   Social Determinants of Health   Financial Resource Strain:   . Difficulty of Paying Living Expenses: Not on file  Food Insecurity:   . Worried About Charity fundraiser in the Last Year: Not on file  . Ran Out of Food in the Last Year: Not on file  Transportation Needs:   . Lack of Transportation (Medical): Not on file  . Lack of Transportation (Non-Medical): Not on file  Physical Activity:   . Days of Exercise per Week: Not on file  . Minutes of Exercise per Session: Not on file  Stress:   . Feeling of Stress : Not on file  Social Connections:   . Frequency of Communication with Friends and Family: Not on file  . Frequency of Social Gatherings with Friends and Family: Not on file  . Attends Religious Services: Not on file  . Active Member of Clubs or Organizations: Not on file  . Attends Archivist Meetings: Not on file  . Marital Status: Not on file  Intimate Partner Violence:   . Fear of Current or Ex-Partner: Not on file  . Emotionally Abused: Not on file  . Physically Abused: Not on file  . Sexually Abused: Not on file     Family History  Problem Relation Age of Onset  . Hypertension Mother   . Pancreatic cancer Father   . Breast cancer Sister     ROS: Otherwise negative unless mentioned in HPI  Physical Examination  Vitals:   08/06/20 0344 08/06/20 1025  BP: (!) 144/57 (!) 144/60  Pulse: 77 74  Resp: 20 19  Temp: 99.1 F (37.3 C) 98.1 F (36.7 C)  SpO2: 95% 94%   Body mass index is 27.46  kg/m.  General:  WDWN in NAD Gait: Not observed HENT: WNL, normocephalic Pulmonary: normal non-labored breathing, without Rales, rhonchi,  wheezing Cardiac: regular Abdomen:  soft, NT/ND, no masses Skin: without rashes Vascular Exam/Pulses: Symmetrical radial pulses Extremities: without ischemic changes, without Gangrene , without cellulitis; without open wounds;  Musculoskeletal: no muscle wasting or atrophy  Neurologic: A&O X 3;  No focal weakness or paresthesias are detected; speech is fluent/normal Psychiatric:  The  pt has Normal affect. Lymph:  Unremarkable  CBC    Component Value Date/Time   WBC 8.6 08/06/2020 0059   RBC 2.96 (L) 08/06/2020 0059   HGB 9.0 (L) 08/06/2020 0059   HCT 28.5 (L) 08/06/2020 0059   PLT 236 08/06/2020 0059   MCV 96.3 08/06/2020 0059   MCH 30.4 08/06/2020 0059   MCHC 31.6 08/06/2020 0059   RDW 14.5 08/06/2020 0059   LYMPHSABS 0.8 08/06/2020 0059   MONOABS 1.8 (H) 08/06/2020 0059   EOSABS 0.1 08/06/2020 0059   BASOSABS 0.0 08/06/2020 0059    BMET    Component Value Date/Time   NA 133 (L) 08/06/2020 0059   NA 137 02/18/2015 0000   K 3.9 08/06/2020 0059   CL 97 (L) 08/06/2020 0059   CO2 28 08/06/2020 0059   GLUCOSE 109 (H) 08/06/2020 0059   BUN 14 08/06/2020 0059   BUN 15 02/18/2015 0000   CREATININE 3.67 (H) 08/06/2020 0059   CALCIUM 8.4 (L) 08/06/2020 0059   GFRNONAA 11 (L) 08/06/2020 0059   GFRAA 13 (L) 08/06/2020 0059    COAGS: Lab Results  Component Value Date   INR 1.3 (H) 07/27/2020   INR 1.2 04/09/2020   INR 1.0 08/03/2009     Non-Invasive Vascular Imaging:   Marginal right cephalic for conduit use Adequate right basilic Adequate left cephalic Adequate left basilic   ASSESSMENT/PLAN: This is a 79 y.o. female with end-stage renal disease requiring hemodialysis  Patient is right arm dominant and would prefer dialysis access placement in left arm Vein mapping performed: Adequate left cephalic and basilic vein for  conduit use Restrict left upper extremity Plan will be for left arm AV fistula creation during this admission On call vascular surgeon Dr. Trula Slade will evaluate the patient later today and provide further treatment plans   Dagoberto Ligas PA-C [facility] [phone]   I agree with the above.  I have seen and evaluated the patient.  She is a new start dialysis and has a catheter placed on the right side.  She is right-handed.  Vein mapping suggests adequate left cephalic and basilic veins for fistula creation.  She does not have a pacemaker or defibrillator.  I discussed proceeding with a brachiocephalic or a first stage left basilic vein fistula creation tomorrow.  I discussed the details of the procedure as well as the risks and benefits including but not limited to the need for future interventions including a second stage procedure, the risk of not maturity, the risk of steal syndrome.  All of her questions were answered.  She has a palpable left brachial and radial pulse.  I have spoken with the dialysis unit tonight and they will plan on dialysis tomorrow after her fistula creation.  She will be n.p.o. after midnight.  Her left arm is restricted.  Cassandra Dyer

## 2020-08-06 NOTE — Progress Notes (Addendum)
[facility]  PROGRESS NOTE  [name] IHK:[id number] DOB: 1941-12-02 DOA: 07/26/2020 PCP: Biagio Borg, MD Admit date - 07/26/2020   Admitting Physician Vernelle Emerald, MD  Outpatient Primary MD for the patient is Biagio Borg, MD  LOS - 10 Brief Narrative   Cassandra Dyer is a 79 y.o. year old female with medical history significant for HTN, HLD, CKD stage III, hepatitis C, chronic anemia, COPD, right nephrectomy in 2018 for renal cell cancer who presented on 07/26/2020 with shortness of breath, vomiting, diarrhea and generalized weakness for the past week, as well as intermittent episodes of lower extremity swelling was found to have creatinine of 12 (previous baseline of 1.3), potassium of 7.2, CO2 12, GFR 3, white count 12.8 concerning for AKI on CKD  Hospital course complicated by initiation of dialysis on 8/14.  Patient has unfortunately had no signs of renal revoery with intermittent dialysis.   Subjective  No more cough. No nausea. No BM in 2 days  A & P   Severe oliguric AKI on CKD stage III in setting of prior remote right nephrectomy, no signs of renal recovery likely progression to ESRD.  Unclear if this is progression of CKD likely due to HTN, possible acute exacerbation related to preceding symptoms of nausea, vomiting, diarrhea though that was only for short period of time.  Suspect the symptoms were likely uremic related.  Renal ultrasound showed no obvious obstruction, has not responded well to Lasix, not a biopsy candidate given solitary kidney per nephrology.  ANA positive, C3 mildly decreased otherwise normal serologies. -Continue dialysis via [facility], Monday Wednesday Friday -Vascular evaluating for possible permanent access during this hospitalization --Nephrology planning for outpatient HD, f/u pth -Avoid nephrotoxins, renally dose medications for GFR less than 15 -Daily weights, strict I's and O's  Thrombocytopenia, resolved.  On presentation on 8/13  platelet count of 240 2K.  Had previous episodes of thrombocytopenia back in April 2021 with nadir of 70 K.   Previously 108 8K on 8/19, now wnl, no signs or symptoms of bleeding -Monitor CBC  Hyperkalemia, resolved.  With calcium gluconate, Lokelma, insulin, bicarb on admission.  Normocytic anemia, hemoglobin stable.  Iron panel consistent with iron deficiency -Continue iron load  Dyspnea and cough, improving.  Has COPD but no active wheezing.  Has improved with dialysis, likely due to volume overload.  Small pleural effusions noted on admitting chest x-ray, and on 2 L O2. CXR on 8/22 with small bilateral effusions ( left decreased from prior imaging) cough better after IV lasix on 8/22 and HD on 8/23 --No further lasix needed --continue with HD as directed by nephrology -Monitor respiratory status  HTN, improved control , at goal  -Hydralazine 100 mg 3 times daily -Discontinued metoprolol in favor of Coreg 12.5 mg twice daily -Continue amlodipine 10 mg daily -Discontinued home ARB due to AKI  Leukocytosis, mild, resolved.  likely stress leukocytosis.  Remains afebrile -Monitor CBC  HLD, stable -Continue Crestor  Acute diarrhea, present on admission, resolved.  Per patient ongoing 3 to 4 days prior to presentation, potential nidus of AKI  History of COPD, no wheezing on exam -As needed albuterol inhaler     Family Communication  : None  Code Status : DNR, discussed on day of admission  Disposition Plan  :  Patient is from home. Anticipated d/c date: 2 to 3 days. Barriers to d/c or necessity for inpatient status:  Continue monitoring of creatinine/kidney function, dialysis needs, volume status,  permanent HD access Consults  : Nephrology, IR  Procedures  :    DVT Prophylaxis  : Heparin  Lab Results  Component Value Date   PLT 236 08/06/2020    Diet :  Diet Order            Diet renal with fluid restriction Room service appropriate? Yes; Fluid consistency: Thin  Diet  effective now                  Inpatient Medications Scheduled Meds: . amLODipine  10 mg Oral Daily  . carvedilol  12.5 mg Oral BID WC  . Chlorhexidine Gluconate Cloth  6 each Topical Q0600  . Chlorhexidine Gluconate Cloth  6 each Topical Q0600  . darbepoetin (ARANESP) injection - DIALYSIS  40 mcg Intravenous Q Mon-HD  . heparin  5,000 Units Subcutaneous Q8H  . hydrALAZINE  100 mg Oral Q8H  . multivitamin with minerals  1 tablet Oral Daily  . rosuvastatin  10 mg Oral Daily   Continuous Infusions:  PRN Meds:.acetaminophen **OR** acetaminophen, hydrALAZINE, ondansetron **OR** ondansetron (ZOFRAN) IV, polyethylene glycol  Antibiotics  :   Anti-infectives (From admission, onward)   Start     Dose/Rate Route Frequency Ordered Stop   07/27/20 1253  ceFAZolin (ANCEF) IVPB 2g/100 mL premix        over 30 Minutes Intravenous Continuous PRN 07/27/20 1253 07/27/20 1253   07/27/20 1244  ceFAZolin (ANCEF) 2-4 GM/100ML-% IVPB       Note to Pharmacy: Margaretmary Dys   : cabinet override      07/27/20 1244 07/28/20 0044       Objective   Vitals:   08/05/20 2000 08/05/20 2341 08/06/20 0344 08/06/20 1025  BP:  (!) 146/60 (!) 144/57 (!) 144/60  Pulse: 77 73 77 74  Resp: 20 20 20 19   Temp:  98.9 F (37.2 C) 99.1 F (37.3 C) 98.1 F (36.7 C)  TempSrc:  Oral Oral Oral  SpO2: 91% 93% 95% 94%  Weight:      Height:        SpO2: 94 % O2 Flow Rate (L/min): 2 L/min  Wt Readings from Last 3 Encounters:  08/05/20 68.1 kg  05/23/20 64.4 kg  04/26/20 66.7 kg    No intake or output data in the 24 hours ending 08/06/20 1356  Physical Exam:     Awake Alert, Oriented X 3, Normal affect No new F.N deficits,  [place].AT, Normal respiratory effort on 2L, decreased breath sounds throughout Dialysis catheter in right chest wall, clean, dry scant nonpitting edema of bilateral upper and lower extremities, continues  to be improving RRR,No Gallops,Rubs or new Murmurs,  +ve B.Sounds, Abd Soft,  No tenderness, No rebound, guarding or rigidity.    I have personally reviewed the following:   Data Reviewed:  CBC Recent Labs  Lab 07/31/20 1056 07/31/20 1200 08/02/20 1011 08/03/20 0814 08/04/20 0133 08/05/20 0035 08/06/20 0059  WBC 12.0*   < > 10.1 8.9 9.0 8.3 8.6  HGB 9.8*   < > 9.5* 8.6* 9.3* 8.8* 9.0*  HCT 31.7*   < > 29.4* 27.5* 28.7* 27.2* 28.5*  PLT 107*   < > 141* 164 221 238 236  MCV 97.5   < > 95.8 95.8 96.0 95.1 96.3  MCH 30.2   < > 30.9 30.0 31.1 30.8 30.4  MCHC 30.9   < > 32.3 31.3 32.4 32.4 31.6  RDW 16.8*   < > 15.8* 15.3 14.9 14.4 14.5  LYMPHSABS  0.8  --  0.4*  --  0.6* 0.7 0.8  MONOABS 1.6*  --  1.4*  --  1.5* 1.5* 1.8*  EOSABS 0.2  --  0.1  --  0.2 0.1 0.1  BASOSABS 0.0  --  0.0  --  0.0 0.0 0.0   < > = values in this interval not displayed.    Chemistries  Recent Labs  Lab 07/31/20 1200 08/05/20 0657 08/06/20 0059  NA 139 131* 133*  K 3.7 3.6 3.9  CL 103 93* 97*  CO2 27 26 28   GLUCOSE 92 104* 109*  BUN 37* 35* 14  CREATININE 3.83* 5.53* 3.67*  CALCIUM 8.4* 8.8* 8.4*   ------------------------------------------------------------------------------------------------------------------ No results for input(s): CHOL, HDL, LDLCALC, TRIG, CHOLHDL, LDLDIRECT in the last 72 hours.  Lab Results  Component Value Date   HGBA1C 5.6 04/08/2020   ------------------------------------------------------------------------------------------------------------------ No results for input(s): TSH, T4TOTAL, T3FREE, THYROIDAB in the last 72 hours.  Invalid input(s): FREET3 ------------------------------------------------------------------------------------------------------------------ No results for input(s): VITAMINB12, FOLATE, FERRITIN, TIBC, IRON, RETICCTPCT in the last 72 hours.  Coagulation profile No results for input(s): INR, PROTIME in the last 168 hours.  No results for input(s): DDIMER in the last 72 hours.  Cardiac Enzymes No results for  input(s): CKMB, TROPONINI, MYOGLOBIN in the last 168 hours.  Invalid input(s): CK ------------------------------------------------------------------------------------------------------------------ No results found for: BNP  Micro Results No results found for this or any previous visit (from the past 240 hour(s)).  Radiology Reports DG Chest 1 View  Result Date: 07/26/2020 CLINICAL DATA:  79 year old female with shortness of breath. EXAM: CHEST  1 VIEW COMPARISON:  Chest radiograph dated 04/26/2020 FINDINGS: Small bilateral pleural effusions with bibasilar atelectasis, increased since the prior radiograph. Pneumonia is not excluded. No pneumothorax. The cardiac borders are silhouetted. Atherosclerotic calcification of the aorta. No acute osseous pathology. IMPRESSION: Small bilateral pleural effusions with bibasilar atelectasis, increased since the prior radiograph. Electronically Signed   By: Anner Crete M.D.   On: 07/26/2020 20:24   DG Chest 2 View  Result Date: 08/04/2020 CLINICAL DATA:  Shortness of breath. EXAM: CHEST - 2 VIEW COMPARISON:  07/26/2020 chest radiograph FINDINGS: Small-moderate bilateral pleural effusions and bibasilar atelectasis again noted with slight decrease in LEFT pleural effusion. The visualized cardiopericardial silhouette is unremarkable. A RIGHT IJ central venous catheter with tip overlying the SUPERIOR cavoatrial junction is noted. There is no evidence of pneumothorax IMPRESSION: Small-moderate bilateral pleural effusions and bibasilar atelectasis again noted with slight decrease in LEFT pleural effusion. Electronically Signed   By: Margarette Canada M.D.   On: 08/04/2020 14:53   US RENAL  Result Date: 07/27/2020 CLINICAL DATA:  Renal failure. EXAM: RENAL / URINARY TRACT ULTRASOUND COMPLETE COMPARISON:  CT 04/08/2020 FINDINGS: Right Kidney: Surgically absent. Left Kidney: Renal measurements: 9.6 x 5.4 x 4.8 cm = volume: 130 mL. Echogenicity within normal limits. No  mass or hydronephrosis visualized. Bladder: Empty and not well assessed. Other: Incidental right pleural effusion. IMPRESSION: 1. Unremarkable sonographic appearance of the left kidney. 2. Right nephrectomy. 3. Urinary bladder is empty and not well assessed. 4. Right pleural effusion incidentally noted. Electronically Signed   By: Keith Rake M.D.   On: 07/27/2020 04:04   IR Fluoro Guide CV Line Right  Result Date: 07/27/2020 CLINICAL DATA:  Acute kidney injury, uremia and need for emergent hemodialysis. Request has been made to place a tunneled hemodialysis catheter due to predicted need for longer term hemodialysis. EXAM: TUNNELED CENTRAL VENOUS HEMODIALYSIS CATHETER PLACEMENT WITH ULTRASOUND AND FLUOROSCOPIC  GUIDANCE ANESTHESIA/SEDATION: 2.0 mg IV Versed; 50 mcg IV Fentanyl. Total Moderate Sedation Time:   20 minutes. The patient's level of consciousness and physiologic status were continuously monitored during the procedure by Radiology nursing. MEDICATIONS: 2 g IV Ancef. FLUOROSCOPY TIME:  24 seconds.  2.0 mGy. PROCEDURE: The procedure, risks, benefits, and alternatives were explained to the patient. Questions regarding the procedure were encouraged and answered. The patient understands and consents to the procedure. A timeout was performed prior to initiating the procedure. The right neck and chest were prepped with chlorhexidine in a sterile fashion, and a sterile drape was applied covering the operative field. Maximum barrier sterile technique with sterile gowns and gloves were used for the procedure. Local anesthesia was provided with 1% lidocaine. Ultrasound was used to confirm patency of the right internal jugular vein. After creating a small venotomy incision, a 21 gauge needle was advanced into the right internal jugular vein under direct, real-time ultrasound guidance. Ultrasound image documentation was performed. After securing guidewire access, an 8 Fr dilator was placed. A J-wire was kinked  to measure appropriate catheter length. A Palindrome tunneled hemodialysis catheter measuring 19 cm from tip to cuff was chosen for placement. This was tunneled in a retrograde fashion from the chest wall to the venotomy incision. At the venotomy, serial dilatation was performed and a 15 Fr peel-away sheath was placed over a guidewire. The catheter was then placed through the sheath and the sheath removed. Final catheter positioning was confirmed and documented with a fluoroscopic spot image. The catheter was aspirated, flushed with saline, and injected with appropriate volume heparin dwells. The venotomy incision was closed with subcuticular 4-0 Vicryl. Dermabond was applied to the incision. The catheter exit site was secured with 0-Prolene retention sutures. COMPLICATIONS: None.  No pneumothorax. FINDINGS: After catheter placement, the tip lies in the right atrium. The catheter aspirates normally and is ready for immediate use. IMPRESSION: Placement of tunneled hemodialysis catheter via the right internal jugular vein. The catheter tip lies in the right atrium. The catheter is ready for immediate use. Electronically Signed   By: Aletta Edouard M.D.   On: 07/27/2020 15:00   IR US Guide Vasc Access Right  Result Date: 07/27/2020 CLINICAL DATA:  Acute kidney injury, uremia and need for emergent hemodialysis. Request has been made to place a tunneled hemodialysis catheter due to predicted need for longer term hemodialysis. EXAM: TUNNELED CENTRAL VENOUS HEMODIALYSIS CATHETER PLACEMENT WITH ULTRASOUND AND FLUOROSCOPIC GUIDANCE ANESTHESIA/SEDATION: 2.0 mg IV Versed; 50 mcg IV Fentanyl. Total Moderate Sedation Time:   20 minutes. The patient's level of consciousness and physiologic status were continuously monitored during the procedure by Radiology nursing. MEDICATIONS: 2 g IV Ancef. FLUOROSCOPY TIME:  24 seconds.  2.0 mGy. PROCEDURE: The procedure, risks, benefits, and alternatives were explained to the patient.  Questions regarding the procedure were encouraged and answered. The patient understands and consents to the procedure. A timeout was performed prior to initiating the procedure. The right neck and chest were prepped with chlorhexidine in a sterile fashion, and a sterile drape was applied covering the operative field. Maximum barrier sterile technique with sterile gowns and gloves were used for the procedure. Local anesthesia was provided with 1% lidocaine. Ultrasound was used to confirm patency of the right internal jugular vein. After creating a small venotomy incision, a 21 gauge needle was advanced into the right internal jugular vein under direct, real-time ultrasound guidance. Ultrasound image documentation was performed. After securing guidewire access, an 8 Fr dilator was  placed. A J-wire was kinked to measure appropriate catheter length. A Palindrome tunneled hemodialysis catheter measuring 19 cm from tip to cuff was chosen for placement. This was tunneled in a retrograde fashion from the chest wall to the venotomy incision. At the venotomy, serial dilatation was performed and a 15 Fr peel-away sheath was placed over a guidewire. The catheter was then placed through the sheath and the sheath removed. Final catheter positioning was confirmed and documented with a fluoroscopic spot image. The catheter was aspirated, flushed with saline, and injected with appropriate volume heparin dwells. The venotomy incision was closed with subcuticular 4-0 Vicryl. Dermabond was applied to the incision. The catheter exit site was secured with 0-Prolene retention sutures. COMPLICATIONS: None.  No pneumothorax. FINDINGS: After catheter placement, the tip lies in the right atrium. The catheter aspirates normally and is ready for immediate use. IMPRESSION: Placement of tunneled hemodialysis catheter via the right internal jugular vein. The catheter tip lies in the right atrium. The catheter is ready for immediate use.  Electronically Signed   By: Aletta Edouard M.D.   On: 07/27/2020 15:00   VAS [place] UPPER EXT VEIN MAPPING (PRE-OP AVF)  Result Date: 08/05/2020 UPPER EXTREMITY VEIN MAPPING  Indications: Pre-access. Performing Technologist: Griffin Basil RCT RDMS  Examination Guidelines: A complete evaluation includes B-mode imaging, spectral Doppler, color Doppler, and power Doppler as needed of all accessible portions of each vessel. Bilateral testing is considered an integral part of a complete examination. Limited examinations for reoccurring indications may be performed as noted. +-----------------+-------------+----------+--------------+ Right Cephalic   Diameter (cm)Depth (cm)   Findings    +-----------------+-------------+----------+--------------+ Shoulder             0.30        1.70                  +-----------------+-------------+----------+--------------+ Prox upper arm       0.25        1.30                  +-----------------+-------------+----------+--------------+ Mid upper arm        0.21        0.86                  +-----------------+-------------+----------+--------------+ Dist upper arm                          not visualized +-----------------+-------------+----------+--------------+ Antecubital fossa    0.27        0.50                  +-----------------+-------------+----------+--------------+ Prox forearm         0.33        0.51                  +-----------------+-------------+----------+--------------+ Mid forearm          0.33        0.48                  +-----------------+-------------+----------+--------------+ Dist forearm         0.29        0.40                  +-----------------+-------------+----------+--------------+ Wrist                0.20        0.28                  +-----------------+-------------+----------+--------------+ +-----------------+-------------+----------+--------+  Right Basilic    Diameter (cm)Depth (cm)Findings  +-----------------+-------------+----------+--------+ Prox upper arm       0.33                        +-----------------+-------------+----------+--------+ Mid upper arm        0.38                        +-----------------+-------------+----------+--------+ Dist upper arm       0.35                        +-----------------+-------------+----------+--------+ Antecubital fossa    0.37                        +-----------------+-------------+----------+--------+ Prox forearm         0.30                        +-----------------+-------------+----------+--------+ Mid forearm          0.23                        +-----------------+-------------+----------+--------+ Distal forearm       0.18                        +-----------------+-------------+----------+--------+ Wrist                0.25                        +-----------------+-------------+----------+--------+ +-----------------+-------------+----------+---------+ Left Cephalic    Diameter (cm)Depth (cm)Findings  +-----------------+-------------+----------+---------+ Shoulder             0.31        1.50             +-----------------+-------------+----------+---------+ Prox upper arm       0.35        0.90             +-----------------+-------------+----------+---------+ Mid upper arm        0.30        0.38             +-----------------+-------------+----------+---------+ Dist upper arm       0.32        0.40             +-----------------+-------------+----------+---------+ Antecubital fossa    0.32        0.34             +-----------------+-------------+----------+---------+ Prox forearm         0.13        0.40   branching +-----------------+-------------+----------+---------+ Mid forearm          0.18        0.32             +-----------------+-------------+----------+---------+ Dist forearm         0.14        0.36              +-----------------+-------------+----------+---------+ Wrist                0.10        0.60             +-----------------+-------------+----------+---------+ +-----------------+-------------+----------+---------+ Left Basilic     Diameter (cm)Depth (cm)Findings  +-----------------+-------------+----------+---------+ Shoulder  0.40                         +-----------------+-------------+----------+---------+ Prox upper arm       0.35                         +-----------------+-------------+----------+---------+ Mid upper arm        0.36                         +-----------------+-------------+----------+---------+ Dist upper arm       0.33                         +-----------------+-------------+----------+---------+ Antecubital fossa    0.33               branching +-----------------+-------------+----------+---------+ Prox forearm         0.19                         +-----------------+-------------+----------+---------+ Mid forearm          0.17                         +-----------------+-------------+----------+---------+ Distal forearm       0.15                         +-----------------+-------------+----------+---------+ Wrist                0.12                         +-----------------+-------------+----------+---------+ *See table(s) above for measurements and observations.  Diagnosing physician:    Preliminary      Time Spent in minutes  30     Desiree Hane M.D on 08/06/2020 at 1:56 PM  To page go to [email or web address] - password [facility]

## 2020-08-06 NOTE — Progress Notes (Signed)
[facility] NEPHROLOGY PROGRESS NOTE  Assessment/ Plan: Pt is a 79 y.o. yo female  HTN, HLD, HCV, COPD, rt nephrectomy in 2018 for RCC p/w nausea and vomiting and AKI on CKD.  # Severe oliguric AKI on CKD3, anuric: S/p rt nephrectomy BL Cr 1.3-1.6 range in recent months presumably chronic component secondary to HTN.  No obvious obstruction. Not a biopsy candidate given solitary kidney.  Initiated dialysis on 8/14.  Serologies without obvious cause of kidney dysfunction but most likely ATN. Receiving dialysis MWF schedule via [facility].  She had HD yesterday, tolerated well. Remains anuric without sign of renal recovery.  Discussed with the patient about permanent access, she agreed with AV fistula creation.  Vein mapping done.  I will consult vascular to place permanent access. She will need arrangement for outpatient HD.  #Anemia of CKD: Received IV iron.  Continue Aranesp.  Monitor hemoglobin.  #Hypertension/volume: Continue current antihypertensive medication.  Manage volume by dialysis.  #Hyperphosphatemia: Phosphorus level improved, off binders.  I will check PTH level.  #Acute diarrhea present on admission: Improving now.  Subjective: Seen and examined.  Denies nausea vomiting chest pain shortness of breath.  No new event.  Remains anuric.    Objective Vital signs in last 24 hours: Vitals:   08/05/20 1942 08/05/20 2000 08/05/20 2341 08/06/20 0344  BP: 138/61  (!) 146/60 (!) 144/57  Pulse: 74 77 73 77  Resp: 17 20 20 20   Temp: 98.6 F (37 C)  98.9 F (37.2 C) 99.1 F (37.3 C)  TempSrc: Oral  Oral Oral  SpO2: 91% 91% 93% 95%  Weight:      Height:       Weight change: -3.4 kg  Intake/Output Summary (Last 24 hours) at 08/06/2020 0817 Last data filed at 08/05/2020 1038 Gross per 24 hour  Intake --  Output 3000 ml  Net -3000 ml       Labs: Basic Metabolic Panel: Recent Labs  Lab 07/31/20 1200 08/05/20 0657 08/06/20 0059  NA 139 131* 133*  K 3.7 3.6  3.9  CL 103 93* 97*  CO2 27 26 28   GLUCOSE 92 104* 109*  BUN 37* 35* 14  CREATININE 3.83* 5.53* 3.67*  CALCIUM 8.4* 8.8* 8.4*  PHOS 3.6 5.1* 2.9   Liver Function Tests: Recent Labs  Lab 07/31/20 1200 08/05/20 0657 08/06/20 0059  ALBUMIN 2.3* 2.2* 2.4*   No results for input(s): LIPASE, AMYLASE in the last 168 hours. No results for input(s): AMMONIA in the last 168 hours. CBC: Recent Labs  Lab 08/02/20 1011 08/02/20 1011 08/03/20 0814 08/03/20 0814 08/04/20 0133 08/05/20 0035 08/06/20 0059  WBC 10.1   < > 8.9   < > 9.0 8.3 8.6  NEUTROABS 8.1*   < >  --   --  6.6 6.0 5.9  HGB 9.5*   < > 8.6*   < > 9.3* 8.8* 9.0*  HCT 29.4*   < > 27.5*   < > 28.7* 27.2* 28.5*  MCV 95.8  --  95.8  --  96.0 95.1 96.3  PLT 141*   < > 164   < > 221 238 236   < > = values in this interval not displayed.   Cardiac Enzymes: No results for input(s): CKTOTAL, CKMB, CKMBINDEX, TROPONINI in the last 168 hours. CBG: No results for input(s): GLUCAP in the last 168 hours.  Iron Studies: No results for input(s): IRON, TIBC, TRANSFERRIN, FERRITIN in the last 72 hours. Studies/Results: DG Chest 2 View  Result Date: 08/04/2020 CLINICAL DATA:  Shortness of breath. EXAM: CHEST - 2 VIEW COMPARISON:  07/26/2020 chest radiograph FINDINGS: Small-moderate bilateral pleural effusions and bibasilar atelectasis again noted with slight decrease in LEFT pleural effusion. The visualized cardiopericardial silhouette is unremarkable. A RIGHT IJ central venous catheter with tip overlying the SUPERIOR cavoatrial junction is noted. There is no evidence of pneumothorax IMPRESSION: Small-moderate bilateral pleural effusions and bibasilar atelectasis again noted with slight decrease in LEFT pleural effusion. Electronically Signed   By: Margarette Canada M.D.   On: 08/04/2020 14:53   VAS [place] UPPER EXT VEIN MAPPING (PRE-OP AVF)  Result Date: 08/05/2020 UPPER EXTREMITY VEIN MAPPING  Indications: Pre-access. Performing Technologist:  Griffin Basil RCT RDMS  Examination Guidelines: A complete evaluation includes B-mode imaging, spectral Doppler, color Doppler, and power Doppler as needed of all accessible portions of each vessel. Bilateral testing is considered an integral part of a complete examination. Limited examinations for reoccurring indications may be performed as noted. +-----------------+-------------+----------+--------------+ Right Cephalic   Diameter (cm)Depth (cm)   Findings    +-----------------+-------------+----------+--------------+ Shoulder             0.30        1.70                  +-----------------+-------------+----------+--------------+ Prox upper arm       0.25        1.30                  +-----------------+-------------+----------+--------------+ Mid upper arm        0.21        0.86                  +-----------------+-------------+----------+--------------+ Dist upper arm                          not visualized +-----------------+-------------+----------+--------------+ Antecubital fossa    0.27        0.50                  +-----------------+-------------+----------+--------------+ Prox forearm         0.33        0.51                  +-----------------+-------------+----------+--------------+ Mid forearm          0.33        0.48                  +-----------------+-------------+----------+--------------+ Dist forearm         0.29        0.40                  +-----------------+-------------+----------+--------------+ Wrist                0.20        0.28                  +-----------------+-------------+----------+--------------+ +-----------------+-------------+----------+--------+ Right Basilic    Diameter (cm)Depth (cm)Findings +-----------------+-------------+----------+--------+ Prox upper arm       0.33                        +-----------------+-------------+----------+--------+ Mid upper arm        0.38                         +-----------------+-------------+----------+--------+ Dist upper arm  0.35                        +-----------------+-------------+----------+--------+ Antecubital fossa    0.37                        +-----------------+-------------+----------+--------+ Prox forearm         0.30                        +-----------------+-------------+----------+--------+ Mid forearm          0.23                        +-----------------+-------------+----------+--------+ Distal forearm       0.18                        +-----------------+-------------+----------+--------+ Wrist                0.25                        +-----------------+-------------+----------+--------+ +-----------------+-------------+----------+---------+ Left Cephalic    Diameter (cm)Depth (cm)Findings  +-----------------+-------------+----------+---------+ Shoulder             0.31        1.50             +-----------------+-------------+----------+---------+ Prox upper arm       0.35        0.90             +-----------------+-------------+----------+---------+ Mid upper arm        0.30        0.38             +-----------------+-------------+----------+---------+ Dist upper arm       0.32        0.40             +-----------------+-------------+----------+---------+ Antecubital fossa    0.32        0.34             +-----------------+-------------+----------+---------+ Prox forearm         0.13        0.40   branching +-----------------+-------------+----------+---------+ Mid forearm          0.18        0.32             +-----------------+-------------+----------+---------+ Dist forearm         0.14        0.36             +-----------------+-------------+----------+---------+ Wrist                0.10        0.60             +-----------------+-------------+----------+---------+ +-----------------+-------------+----------+---------+ Left Basilic     Diameter  (cm)Depth (cm)Findings  +-----------------+-------------+----------+---------+ Shoulder             0.40                         +-----------------+-------------+----------+---------+ Prox upper arm       0.35                         +-----------------+-------------+----------+---------+ Mid upper arm        0.36                         +-----------------+-------------+----------+---------+  Dist upper arm       0.33                         +-----------------+-------------+----------+---------+ Antecubital fossa    0.33               branching +-----------------+-------------+----------+---------+ Prox forearm         0.19                         +-----------------+-------------+----------+---------+ Mid forearm          0.17                         +-----------------+-------------+----------+---------+ Distal forearm       0.15                         +-----------------+-------------+----------+---------+ Wrist                0.12                         +-----------------+-------------+----------+---------+ *See table(s) above for measurements and observations.  Diagnosing physician:    Preliminary     Medications: Infusions:   Scheduled Medications: . amLODipine  10 mg Oral Daily  . carvedilol  12.5 mg Oral BID WC  . Chlorhexidine Gluconate Cloth  6 each Topical Q0600  . darbepoetin (ARANESP) injection - DIALYSIS  40 mcg Intravenous Q Mon-HD  . heparin  5,000 Units Subcutaneous Q8H  . hydrALAZINE  100 mg Oral Q8H  . multivitamin with minerals  1 tablet Oral Daily  . rosuvastatin  10 mg Oral Daily    have reviewed scheduled and prn medications.  Physical Exam: General:NAD, comfortable Heart:RRR, s1s2 nl Lungs:clear b/l, no crackle Abdomen:soft, Non-tender, non-distended Extremities:No edema Dialysis Access: Right IJ TDC placed by IR on 07/27/2020.  Kyion Gautier Prasad Melton Walls 08/06/2020,8:17 AM  LOS: 10 days  Pager: [phone]

## 2020-08-06 NOTE — H&P (View-Only) (Signed)
Hospital Consult    Reason for Consult: Permanent dialysis access Requesting Physician: Dr. Carolin Sicks MRN #:  [id number]  History of Present Illness: This is a 79 y.o. female seen in consultation for evaluation for permanent dialysis access placement.  Patient is requiring hemodialysis.  Right IJ tunneled dialysis catheter was placed by interventional radiology during this admission.  Patient states she is right arm dominant and would prefer access placement in her left arm.  She does not take any blood thinners.  She does not have a pacemaker.  She is willing to proceed with permanent dialysis access during this admission.  Vein mapping has been performed and results can be seen on patient's chart.  Past Medical History:  Diagnosis Date  . Anemia   . Hepatitis    hx of hep C - 10-20 years ago   . History of blood transfusion   . History of kidney cancer   . Hyperlipidemia   . Hypertension    not taken b/p med in 2-3 years md deceased never went to another md     Past Surgical History:  Procedure Laterality Date  . CATARACT EXTRACTION Bilateral    11/30 left /12/9 right  . IR FLUORO GUIDE CV LINE RIGHT  07/27/2020  . IR US GUIDE VASC ACCESS RIGHT  07/27/2020  . ROBOT ASSISTED LAPAROSCOPIC NEPHRECTOMY Right 01/13/2017   Procedure: XI ROBOTIC ASSISTED LAPAROSCOPIC RADICAL NEPHRECTOMY WITH LYSIS OF ADHESION;  Surgeon: Alexis Frock, MD;  Location: [facility];  Service: Urology;  Laterality: Right;    No Known Allergies  Prior to Admission medications   Medication Sig Start Date End Date Taking? Authorizing Provider  amLODipine (NORVASC) 10 MG tablet Take 1 tablet (10 mg total) by mouth daily. 04/19/20  Yes Biagio Borg, MD  Ascorbic Acid (VITAMIN C PO) Take 500 mg by mouth in the morning and at bedtime.    Yes [provider]  benzonatate (TESSALON) 200 MG capsule Take 1 capsule (200 mg total) by mouth 3 (three) times daily as needed. 07/22/20  Yes Marrian Salvage, FNP    Black Currant Seed Oil 500 MG CAPS Take 500 mg by mouth daily.   Yes [provider]  Calcium Carbonate-Vit D-Min (CALCIUM 1200 PO) Take 1,200 mg by mouth daily.    Yes [provider]  Cholecalciferol (VITAMIN D PO) Take 2,000 mcg by mouth See admin instructions. Takes 2000 mcg 3 times a week ( monday, wednesday and friday)   Yes [provider]  doxycycline (VIBRA-TABS) 100 MG tablet Take 1 tablet (100 mg total) by mouth 2 (two) times daily. 07/22/20  Yes Marrian Salvage, FNP  Garlic 7253 MG CAPS Take 2,000 mg by mouth daily.    Yes [provider]  hydrochlorothiazide (HYDRODIURIL) 25 MG tablet Take 1 tablet (25 mg total) by mouth daily. 04/19/20  Yes Biagio Borg, MD  metoprolol succinate (TOPROL-XL) 100 MG 24 hr tablet Take 1 tablet (100 mg total) by mouth daily. Take with or immediately following a meal. 04/19/20  Yes Biagio Borg, MD  Multiple Vitamin (MULTIVITAMIN WITH MINERALS) TABS tablet Take 1 tablet by mouth daily.   Yes [provider]  Omega-3 Fatty Acids (FISH OIL PO) Take 1 tablet by mouth daily.   Yes [provider]  ondansetron (ZOFRAN) 4 MG tablet Take 1 tablet (4 mg total) by mouth every 8 (eight) hours as needed for nausea or vomiting. 04/19/20  Yes Biagio Borg, MD  OVER THE COUNTER  MEDICATION Take 1 capsule by mouth daily. Mega juice   Yes [provider]  rosuvastatin (CRESTOR) 10 MG tablet Take 1 tablet by mouth once daily Patient taking differently: Take 10 mg by mouth daily.  07/22/20  Yes Biagio Borg, MD  telmisartan (MICARDIS) 80 MG tablet Take 1 tablet (80 mg total) by mouth daily. 04/26/20  Yes Biagio Borg, MD    Social History   Socioeconomic History  . Marital status: Significant Other    Spouse name: Not on file  . Number of children: 0  . Years of education: 9  . Highest education level: Not on file  Occupational History  . Occupation: Retired  Tobacco Use  . Smoking status: Former  Smoker    Packs/day: 0.50    Years: 30.00    Pack years: 15.00    Types: Cigarettes    Quit date: 12/14/2000    Years since quitting: 19.6  . Smokeless tobacco: Never Used  Vaping Use  . Vaping Use: Never used  Substance and Sexual Activity  . Alcohol use: Yes    Comment: occasional wine   . Drug use: No  . Sexual activity: Not Currently  Other Topics Concern  . Not on file  Social History Narrative   Fun/Hobby: Watching TV and crossword puzzles   Social Determinants of Health   Financial Resource Strain:   . Difficulty of Paying Living Expenses: Not on file  Food Insecurity:   . Worried About Charity fundraiser in the Last Year: Not on file  . Ran Out of Food in the Last Year: Not on file  Transportation Needs:   . Lack of Transportation (Medical): Not on file  . Lack of Transportation (Non-Medical): Not on file  Physical Activity:   . Days of Exercise per Week: Not on file  . Minutes of Exercise per Session: Not on file  Stress:   . Feeling of Stress : Not on file  Social Connections:   . Frequency of Communication with Friends and Family: Not on file  . Frequency of Social Gatherings with Friends and Family: Not on file  . Attends Religious Services: Not on file  . Active Member of Clubs or Organizations: Not on file  . Attends Archivist Meetings: Not on file  . Marital Status: Not on file  Intimate Partner Violence:   . Fear of Current or Ex-Partner: Not on file  . Emotionally Abused: Not on file  . Physically Abused: Not on file  . Sexually Abused: Not on file     Family History  Problem Relation Age of Onset  . Hypertension Mother   . Pancreatic cancer Father   . Breast cancer Sister     ROS: Otherwise negative unless mentioned in HPI  Physical Examination  Vitals:   08/06/20 0344 08/06/20 1025  BP: (!) 144/57 (!) 144/60  Pulse: 77 74  Resp: 20 19  Temp: 99.1 F (37.3 C) 98.1 F (36.7 C)  SpO2: 95% 94%   Body mass index is 27.46  kg/m.  General:  WDWN in NAD Gait: Not observed HENT: WNL, normocephalic Pulmonary: normal non-labored breathing, without Rales, rhonchi,  wheezing Cardiac: regular Abdomen:  soft, NT/ND, no masses Skin: without rashes Vascular Exam/Pulses: Symmetrical radial pulses Extremities: without ischemic changes, without Gangrene , without cellulitis; without open wounds;  Musculoskeletal: no muscle wasting or atrophy  Neurologic: A&O X 3;  No focal weakness or paresthesias are detected; speech is fluent/normal Psychiatric:  The  pt has Normal affect. Lymph:  Unremarkable  CBC    Component Value Date/Time   WBC 8.6 08/06/2020 0059   RBC 2.96 (L) 08/06/2020 0059   HGB 9.0 (L) 08/06/2020 0059   HCT 28.5 (L) 08/06/2020 0059   PLT 236 08/06/2020 0059   MCV 96.3 08/06/2020 0059   MCH 30.4 08/06/2020 0059   MCHC 31.6 08/06/2020 0059   RDW 14.5 08/06/2020 0059   LYMPHSABS 0.8 08/06/2020 0059   MONOABS 1.8 (H) 08/06/2020 0059   EOSABS 0.1 08/06/2020 0059   BASOSABS 0.0 08/06/2020 0059    BMET    Component Value Date/Time   NA 133 (L) 08/06/2020 0059   NA 137 02/18/2015 0000   K 3.9 08/06/2020 0059   CL 97 (L) 08/06/2020 0059   CO2 28 08/06/2020 0059   GLUCOSE 109 (H) 08/06/2020 0059   BUN 14 08/06/2020 0059   BUN 15 02/18/2015 0000   CREATININE 3.67 (H) 08/06/2020 0059   CALCIUM 8.4 (L) 08/06/2020 0059   GFRNONAA 11 (L) 08/06/2020 0059   GFRAA 13 (L) 08/06/2020 0059    COAGS: Lab Results  Component Value Date   INR 1.3 (H) 07/27/2020   INR 1.2 04/09/2020   INR 1.0 08/03/2009     Non-Invasive Vascular Imaging:   Marginal right cephalic for conduit use Adequate right basilic Adequate left cephalic Adequate left basilic   ASSESSMENT/PLAN: This is a 79 y.o. female with end-stage renal disease requiring hemodialysis  Patient is right arm dominant and would prefer dialysis access placement in left arm Vein mapping performed: Adequate left cephalic and basilic vein for  conduit use Restrict left upper extremity Plan will be for left arm AV fistula creation during this admission On call vascular surgeon Dr. Trula Slade will evaluate the patient later today and provide further treatment plans   Dagoberto Ligas PA-C [facility] [phone]   I agree with the above.  I have seen and evaluated the patient.  She is a new start dialysis and has a catheter placed on the right side.  She is right-handed.  Vein mapping suggests adequate left cephalic and basilic veins for fistula creation.  She does not have a pacemaker or defibrillator.  I discussed proceeding with a brachiocephalic or a first stage left basilic vein fistula creation tomorrow.  I discussed the details of the procedure as well as the risks and benefits including but not limited to the need for future interventions including a second stage procedure, the risk of not maturity, the risk of steal syndrome.  All of her questions were answered.  She has a palpable left brachial and radial pulse.  I have spoken with the dialysis unit tonight and they will plan on dialysis tomorrow after her fistula creation.  She will be n.p.o. after midnight.  Her left arm is restricted.  Annamarie Major

## 2020-08-06 NOTE — Progress Notes (Signed)
    NPO past MN for left AV fistula verses graft 08/07/2020  Roxy Horseman PA-C

## 2020-08-06 NOTE — Anesthesia Preprocedure Evaluation (Addendum)
Anesthesia Evaluation  Patient identified by MRN, date of birth, ID band Patient awake    Reviewed: Allergy & Precautions, NPO status , Patient's Chart, lab work & pertinent test results, reviewed documented beta blocker date and time   Airway Mallampati: II  TM Distance: >3 FB Neck ROM: Full    Dental  (+) Edentulous Upper, Edentulous Lower   Pulmonary former smoker,  Quit smoking 2002, 15 pack year history    Pulmonary exam normal breath sounds clear to auscultation       Cardiovascular hypertension, Pt. on medications and Pt. on home beta blockers Normal cardiovascular exam Rhythm:Regular Rate:Normal     Neuro/Psych negative neurological ROS  negative psych ROS   GI/Hepatic negative GI ROS, (+) Hepatitis -, C  Endo/Other  negative endocrine ROS  Renal/GU ESRF and DialysisRenal diseaseLast HD 8/23, due today K 3.8 this AM  negative genitourinary   Musculoskeletal negative musculoskeletal ROS (+)   Abdominal   Peds  Hematology  (+) Blood dyscrasia, anemia , H/H 9.5/28.9, plt 272   Anesthesia Other Findings   Reproductive/Obstetrics negative OB ROS                            Anesthesia Physical Anesthesia Plan  ASA: III  Anesthesia Plan: MAC and Regional   Post-op Pain Management:    Induction: Intravenous  PONV Risk Score and Plan: Propofol infusion, TIVA and Treatment may vary due to age or medical condition  Airway Management Planned: Natural Airway and Simple Face Mask  Additional Equipment: None  Intra-op Plan:   Post-operative Plan: Extubation in OR  Informed Consent: I have reviewed the patients History and Physical, chart, labs and discussed the procedure including the risks, benefits and alternatives for the proposed anesthesia with the patient or authorized representative who has indicated his/her understanding and acceptance.   Patient has DNR.  Discussed DNR  with patient and Suspend DNR.     Plan Discussed with: CRNA  Anesthesia Plan Comments:       Anesthesia Quick Evaluation

## 2020-08-07 ENCOUNTER — Inpatient Hospital Stay (HOSPITAL_COMMUNITY): Payer: Medicare Other | Admitting: Anesthesiology

## 2020-08-07 ENCOUNTER — Encounter (HOSPITAL_COMMUNITY): Payer: Self-pay | Admitting: Internal Medicine

## 2020-08-07 ENCOUNTER — Encounter (HOSPITAL_COMMUNITY)
Admission: EM | Disposition: A | Discharge status: Home / Self Care | Payer: Self-pay | Source: Home / Self Care | Attending: Internal Medicine

## 2020-08-07 HISTORY — PX: AV FISTULA PLACEMENT: SHX1204

## 2020-08-07 LAB — GLUCOSE, CAPILLARY: Glucose-Capillary: 101 mg/dL — ABNORMAL HIGH (ref 70–99)

## 2020-08-07 LAB — BASIC METABOLIC PANEL
Anion gap: 10 (ref 5–15)
BUN: 19 mg/dL (ref 8–23)
CO2: 25 mmol/L (ref 22–32)
Calcium: 8.8 mg/dL — ABNORMAL LOW (ref 8.9–10.3)
Chloride: 95 mmol/L — ABNORMAL LOW (ref 98–111)
Creatinine, Ser: 4.65 mg/dL — ABNORMAL HIGH (ref 0.44–1.00)
GFR calc Af Amer: 10 mL/min — ABNORMAL LOW (ref >60)
GFR calc non Af Amer: 8 mL/min — ABNORMAL LOW (ref >60)
Glucose, Bld: 143 mg/dL — ABNORMAL HIGH (ref 70–99)
Potassium: 3.8 mmol/L (ref 3.5–5.1)
Sodium: 130 mmol/L — ABNORMAL LOW (ref 135–145)

## 2020-08-07 LAB — CBC WITH DIFFERENTIAL/PLATELET
Abs Immature Granulocytes: 0.04 10*3/uL (ref 0.00–0.07)
Basophils Absolute: 0 10*3/uL (ref 0.0–0.1)
Basophils Relative: 0 %
Eosinophils Absolute: 0.2 10*3/uL (ref 0.0–0.5)
Eosinophils Relative: 2 %
HCT: 28.9 % — ABNORMAL LOW (ref 36.0–46.0)
Hemoglobin: 9.5 g/dL — ABNORMAL LOW (ref 12.0–15.0)
Immature Granulocytes: 1 %
Lymphocytes Relative: 7 %
Lymphs Abs: 0.6 10*3/uL — ABNORMAL LOW (ref 0.7–4.0)
MCH: 31.3 pg (ref 26.0–34.0)
MCHC: 32.9 g/dL (ref 30.0–36.0)
MCV: 95.1 fL (ref 80.0–100.0)
Monocytes Absolute: 1.6 10*3/uL — ABNORMAL HIGH (ref 0.1–1.0)
Monocytes Relative: 18 %
Neutro Abs: 6.3 10*3/uL (ref 1.7–7.7)
Neutrophils Relative %: 72 %
Platelets: 272 10*3/uL (ref 150–400)
RBC: 3.04 MIL/uL — ABNORMAL LOW (ref 3.87–5.11)
RDW: 14.4 % (ref 11.5–15.5)
WBC: 8.7 10*3/uL (ref 4.0–10.5)
nRBC: 0 % (ref 0.0–0.2)

## 2020-08-07 LAB — PROTIME-INR
INR: 1.1 (ref 0.8–1.2)
Prothrombin Time: 14.1 seconds (ref 11.4–15.2)

## 2020-08-07 LAB — PTH, INTACT AND CALCIUM
Calcium, Total (PTH): 8.1 mg/dL — ABNORMAL LOW (ref 8.7–10.3)
PTH: 51 pg/mL (ref 15–65)

## 2020-08-07 SURGERY — ARTERIOVENOUS (AV) FISTULA CREATION
Anesthesia: Monitor Anesthesia Care | Site: Arm Lower | Laterality: Left

## 2020-08-07 MED ORDER — BUPIVACAINE-EPINEPHRINE 0.5% -1:200000 IJ SOLN
INTRAMUSCULAR | Status: AC
  Filled 2020-08-07: qty 1

## 2020-08-07 MED ORDER — SODIUM CHLORIDE 0.9 % IV SOLN
INTRAVENOUS | Status: DC | PRN
  Administered 2020-08-07: 500 mL

## 2020-08-07 MED ORDER — SODIUM CHLORIDE 0.9 % IV SOLN: INTRAVENOUS | Status: DC

## 2020-08-07 MED ORDER — MIDAZOLAM HCL 2 MG/2ML IJ SOLN: 2.0000 mg | Freq: Once | INTRAMUSCULAR | Status: AC

## 2020-08-07 MED ORDER — MIDAZOLAM HCL 2 MG/2ML IJ SOLN
INTRAMUSCULAR | Status: AC
  Administered 2020-08-07: 2 mg via INTRAVENOUS
  Filled 2020-08-07: qty 2

## 2020-08-07 MED ORDER — CHLORHEXIDINE GLUCONATE 0.12 % MT SOLN
15.0000 mL | OROMUCOSAL | Status: AC
  Administered 2020-08-07: 15 mL via OROMUCOSAL

## 2020-08-07 MED ORDER — FENTANYL CITRATE (PF) 250 MCG/5ML IJ SOLN
INTRAMUSCULAR | Status: AC
  Filled 2020-08-07: qty 5

## 2020-08-07 MED ORDER — PROPOFOL 500 MG/50ML IV EMUL
INTRAVENOUS | Status: DC | PRN
  Administered 2020-08-07: 15 ug/kg/min via INTRAVENOUS

## 2020-08-07 MED ORDER — CEFAZOLIN SODIUM 1 G IJ SOLR
INTRAMUSCULAR | Status: AC
  Filled 2020-08-07: qty 10

## 2020-08-07 MED ORDER — LIDOCAINE-EPINEPHRINE (PF) 1.5 %-1:200000 IJ SOLN
INTRAMUSCULAR | Status: DC | PRN
  Administered 2020-08-07: 20 mL via PERINEURAL

## 2020-08-07 MED ORDER — FENTANYL CITRATE (PF) 100 MCG/2ML IJ SOLN: 25.0000 ug | INTRAMUSCULAR | Status: DC | PRN

## 2020-08-07 MED ORDER — FENTANYL CITRATE (PF) 100 MCG/2ML IJ SOLN
INTRAMUSCULAR | Status: AC
  Administered 2020-08-07: 50 ug via INTRAVENOUS
  Filled 2020-08-07: qty 2

## 2020-08-07 MED ORDER — FENTANYL CITRATE (PF) 100 MCG/2ML IJ SOLN: 50.0000 ug | Freq: Once | INTRAMUSCULAR | Status: AC

## 2020-08-07 MED ORDER — ONDANSETRON HCL 4 MG/2ML IJ SOLN
INTRAMUSCULAR | Status: DC | PRN
  Administered 2020-08-07: 4 mg via INTRAVENOUS

## 2020-08-07 MED ORDER — ONDANSETRON HCL 4 MG/2ML IJ SOLN: 4.0000 mg | Freq: Once | INTRAMUSCULAR | Status: DC | PRN

## 2020-08-07 MED ORDER — ACETAMINOPHEN 500 MG PO TABS
1000.0000 mg | ORAL_TABLET | Freq: Once | ORAL | Status: AC
  Administered 2020-08-07: 1000 mg via ORAL
  Filled 2020-08-07: qty 2

## 2020-08-07 MED ORDER — CHLORHEXIDINE GLUCONATE 0.12 % MT SOLN
OROMUCOSAL | Status: AC
  Filled 2020-08-07: qty 15

## 2020-08-07 MED ORDER — 0.9 % SODIUM CHLORIDE (POUR BTL) OPTIME
TOPICAL | Status: DC | PRN
  Administered 2020-08-07: 1000 mL

## 2020-08-07 MED ORDER — OXYCODONE-ACETAMINOPHEN 5-325 MG PO TABS: 1.0000 | ORAL_TABLET | Freq: Four times a day (QID) | ORAL | Status: DC | PRN

## 2020-08-07 MED ORDER — SODIUM CHLORIDE 0.9 % IV SOLN
INTRAVENOUS | Status: AC
  Filled 2020-08-07: qty 1.2

## 2020-08-07 SURGICAL SUPPLY — 28 items
ADH SKN CLS APL DERMABOND .7 (GAUZE/BANDAGES/DRESSINGS) ×1
ARMBAND PINK RESTRICT EXTREMIT (MISCELLANEOUS) ×6 IMPLANT
CANISTER SUCT 3000ML PPV (MISCELLANEOUS) ×3 IMPLANT
CANNULA VESSEL 3MM 2 BLNT TIP (CANNULA) ×3 IMPLANT
CLIP LIGATING EXTRA MED SLVR (CLIP) ×3 IMPLANT
CLIP LIGATING EXTRA SM BLUE (MISCELLANEOUS) ×3 IMPLANT
COVER PROBE W GEL 5X96 (DRAPES) ×3 IMPLANT
COVER WAND RF STERILE (DRAPES) ×3 IMPLANT
DECANTER SPIKE VIAL GLASS SM (MISCELLANEOUS) ×3 IMPLANT
DERMABOND ADVANCED (GAUZE/BANDAGES/DRESSINGS) ×2
DERMABOND ADVANCED .7 DNX12 (GAUZE/BANDAGES/DRESSINGS) ×1 IMPLANT
ELECT REM PT RETURN 9FT ADLT (ELECTROSURGICAL) ×3
ELECTRODE REM PT RTRN 9FT ADLT (ELECTROSURGICAL) ×1 IMPLANT
GLOVE SS BIOGEL STRL SZ 7.5 (GLOVE) ×1 IMPLANT
GLOVE SUPERSENSE BIOGEL SZ 7.5 (GLOVE) ×2
GOWN STRL REUS W/ TWL LRG LVL3 (GOWN DISPOSABLE) ×3 IMPLANT
GOWN STRL REUS W/TWL LRG LVL3 (GOWN DISPOSABLE) ×9
KIT BASIN OR (CUSTOM PROCEDURE TRAY) ×3 IMPLANT
KIT TURNOVER KIT B (KITS) ×3 IMPLANT
NS IRRIG 1000ML POUR BTL (IV SOLUTION) ×3 IMPLANT
PACK CV ACCESS (CUSTOM PROCEDURE TRAY) ×3 IMPLANT
PAD ARMBOARD 7.5X6 YLW CONV (MISCELLANEOUS) ×6 IMPLANT
SUT PROLENE 6 0 CC (SUTURE) ×3 IMPLANT
SUT VIC AB 3-0 SH 27 (SUTURE) ×3
SUT VIC AB 3-0 SH 27X BRD (SUTURE) ×1 IMPLANT
TOWEL GREEN STERILE (TOWEL DISPOSABLE) ×3 IMPLANT
UNDERPAD 30X36 HEAVY ABSORB (UNDERPADS AND DIAPERS) ×3 IMPLANT
WATER STERILE IRR 1000ML POUR (IV SOLUTION) ×3 IMPLANT

## 2020-08-07 NOTE — Op Note (Signed)
    OPERATIVE REPORT  DATE OF SURGERY: 08/07/2020  PATIENT: Cassandra Dyer, 79 y.o. female MRN: [id number]  DOB: July 15, 1941  PRE-OPERATIVE DIAGNOSIS: End-stage renal disease  POST-OPERATIVE DIAGNOSIS:  Same  PROCEDURE: Left brachiocephalic AV fistula creation  SURGEON:  Curt Jews, M.D.  PHYSICIAN ASSISTANT: Rhyne PAC  The assistant was needed for exposure and to expedite the case  ANESTHESIA: Axillary block  EBL: per anesthesia record  Total I/O In: -  Out: 10 [Blood:10]  BLOOD ADMINISTERED: none  DRAINS: none  SPECIMEN: none  COUNTS CORRECT:  YES  PATIENT DISPOSITION:  PACU - hemodynamically stable  PROCEDURE DETAILS: The patient was taken operating placed supine position where the area of the left arm and left axilla were prepped and draped in sterile fashion.  SonoSite ultrasound was used to visualize the cephalic vein at the antecubital space which was of good caliber.  The vein was patent through the upper arm.  Patient also had a good caliber basilic vein.  Decision was made to create a brachial artery to basilic vein fistula.  Incision was made over the antecubital space and carried down to isolate the cephalic vein which was a very good caliber.  Tributary branches were ligated and divided.  The vein was ligated distally and divided and mobilized to the level of the brachial artery.  The artery was occluded proximally distally and was opened with 11 blade sent longstanding with Potts scissors.  The vein was spatulated and cut the appropriate length and sewn end-to-side to the artery with a running 6-0 Prolene suture.  Clamps removed.  There was excellent thrill in the vein.  The patient maintained a 2+ radial pulse.  Sterile dressing was applied and the patient was transferred to the recovery in stable condition   [name], M.D., [facility] 08/07/2020 12:53 PM

## 2020-08-07 NOTE — Discharge Instructions (Signed)
   [facility]  Discharge Instructions  AV Fistula or Graft Surgery for Dialysis Access  Please refer to the following instructions for your post-procedure care. Your surgeon or physician assistant will discuss any changes with you.  Activity  You may drive the day following your surgery, if you are comfortable and no longer taking prescription pain medication. Resume full activity as the soreness in your incision resolves.  Bathing/Showering  You may shower after you go home. Keep your incision dry for 48 hours. Do not soak in a bathtub, hot tub, or swim until the incision heals completely. You may not shower if you have a hemodialysis catheter.  Incision Care  Clean your incision with mild soap and water after 48 hours. Pat the area dry with a clean towel. You do not need a bandage unless otherwise instructed. Do not apply any ointments or creams to your incision. You may have skin glue on your incision. Do not peel it off. It will come off on its own in about one week. Your arm may swell a bit after surgery. To reduce swelling use pillows to elevate your arm so it is above your heart. Your doctor will tell you if you need to lightly wrap your arm with an ACE bandage.  Diet  Resume your normal diet. There are not special food restrictions following this procedure. In order to heal from your surgery, it is CRITICAL to get adequate nutrition. Your body requires vitamins, minerals, and protein. Vegetables are the best source of vitamins and minerals. Vegetables also provide the perfect balance of protein. Processed food has little nutritional value, so try to avoid this.  Medications  Resume taking all of your medications. If your incision is causing pain, you may take over-the counter pain relievers such as acetaminophen (Tylenol). If you were prescribed a stronger pain medication, please be aware these medications can cause nausea and constipation. Prevent  nausea by taking the medication with a snack or meal. Avoid constipation by drinking plenty of fluids and eating foods with high amount of fiber, such as fruits, vegetables, and grains.  Do not take Tylenol if you are taking prescription pain medications.  Follow up Your surgeon may want to see you in the office following your access surgery. If so, this will be arranged at the time of your surgery.  Please call us immediately for any of the following conditions:  . Increased pain, redness, drainage (pus) from your incision site . Fever of 101 degrees or higher . Severe or worsening pain at your incision site . Hand pain or numbness. .  Reduce your risk of vascular disease:  . Stop smoking. If you would like help, call [facility] at 1-800-QUIT-NOW 671-868-3681) or [place] at (939)096-1215  . Manage your cholesterol . Maintain a desired weight . Control your diabetes . Keep your blood pressure down  Dialysis  It will take several weeks to several months for your new dialysis access to be ready for use. Your surgeon will determine when it is okay to use it. Your nephrologist will continue to direct your dialysis. You can continue to use your Permcath until your new access is ready for use.   08/07/2020 Cassandra Dyer [id number] 1941/10/07  Surgeon(s): Early, Arvilla Meres, MD  Procedure(s): Creation of left brachiocephalic AV fistula  x Do not stick fistula for 12 weeks    If you have any questions, please call the office at [phone].

## 2020-08-07 NOTE — Progress Notes (Signed)
Back from PACU by bed awake and alert. Left arm A_V fistula site warm to touch. + faint  bruit and thrill.

## 2020-08-07 NOTE — Anesthesia Postprocedure Evaluation (Signed)
Anesthesia Post Note  Patient: Cassandra Dyer  Procedure(s) Performed: LEFT ARM ARTERIOVENOUS (AV) FISTULA CREATION (Left Arm Lower)     Patient location during evaluation: PACU Anesthesia Type: Regional and MAC Level of consciousness: awake and alert Pain management: pain level controlled Vital Signs Assessment: post-procedure vital signs reviewed and stable Respiratory status: spontaneous breathing, nonlabored ventilation and respiratory function stable Cardiovascular status: blood pressure returned to baseline and stable Postop Assessment: no apparent nausea or vomiting Anesthetic complications: no   No complications documented.  Last Vitals:  Vitals:   08/07/20 1238 08/07/20 1253  BP: (!) 142/53 (!) 139/54  Pulse: 69 65  Resp: (!) 21 (!) 21  Temp: 36.6 C   SpO2: 96% 98%    Last Pain:  Vitals:   08/07/20 1238  TempSrc:   PainSc: 0-No pain                 Pervis Hocking

## 2020-08-07 NOTE — Progress Notes (Signed)
Transported to hemo. By bed awake and alert.

## 2020-08-07 NOTE — Progress Notes (Signed)
Back from Hemodialysis by bed awake and alert.

## 2020-08-07 NOTE — Progress Notes (Signed)
Patient has been accepted at [facility] on a MWF schedule with a seat time of 12:00pm. She needs to arrive to her appointments at 11:40am.  On her first day at the clinic, she needs to arrive at 11:00am to complete intake paperwork prior to her first treatment. OP HD clinic is prepared to start her on Friday, 08/09/20 if she is discharged prior to this date. Navigator notified Nephrologist/Dr. Carolin Sicks. Navigator met with patient at bedside to inform of above verbally and in writing. She states understanding and reports that her boyfriend Eddie Dibbles will take her to/from HD. She asked that Navigator call him with information also. Navigator left message for Butler Denmark on number in [place].  Navigator will follow for discharge date/start date in clinic to assist with smooth transition from hospital to OP HD clinic.  Alphonzo Cruise, [place] [facility] Navigator [phone]

## 2020-08-07 NOTE — Anesthesia Procedure Notes (Signed)
Anesthesia Regional Block: Supraclavicular block   Pre-Anesthetic Checklist: ,, timeout performed, Correct Patient, Correct Site, Correct Laterality, Correct Procedure, Correct Position, site marked, Risks and benefits discussed,  Surgical consent,  Pre-op evaluation,  At surgeon's request and post-op pain management  Laterality: Left  Prep: Maximum Sterile Barrier Precautions used, chloraprep       Needles:  Injection technique: Single-shot  Needle Type: Echogenic Stimulator Needle     Needle Length: 9cm  Needle Gauge: 22     Additional Needles:   Procedures:,,,, ultrasound used (permanent image in chart),,,,  Narrative:  Start time: 08/07/2020 10:30 AM End time: 08/07/2020 10:40 AM Injection made incrementally with aspirations every 5 mL.  Performed by: Personally  Anesthesiologist: Pervis Hocking, DO  Additional Notes: Monitors applied. No increased pain on injection. No increased resistance to injection. Injection made in 5cc increments. Good needle visualization. Patient tolerated procedure well.

## 2020-08-07 NOTE — Progress Notes (Signed)
PROGRESS NOTE    Cassandra Dyer  TIW:[id number] DOB: 01-02-1941 DOA: 07/26/2020 PCP: Biagio Borg, MD   Brief Narrative:Cassandra Dyer is a 79 y.o. year old female with medical history significant for HTN, HLD, CKD stage III, hepatitis C, chronic anemia, COPD, right nephrectomy in 2018 for renal cell cancer who presented on 07/26/2020 with shortness of breath, vomiting, diarrhea and generalized weakness for the past week, as well as intermittent episodes of lower extremity swelling was found to have creatinine of 12 (previous baseline of 1.3), potassium of 7.2, CO2 12, GFR 3, white count 12.8 concerning for AKI on CKD  Hospital course complicated by initiation of dialysis on 8/14.  Patient has unfortunately had no signs of renal revoery with intermittent dialysis.  Assessment & Plan:   Principal Problem:   Acute kidney injury (AKI) with acute tubular necrosis (ATN) (HCC) Active Problems:   Essential hypertension   Hyperkalemia, diminished renal excretion   Mixed hyperlipidemia   Metabolic acidosis   Thrombocytopenia (HCC)   Anemia   Cough   #1 AKI on CKD stage III in the setting of prior right nephrectomy with no signs of renal recovery and progression to ESRD.  Patient to have fistula placed today.  Continue dialysis Monday Wednesday and Friday.  Outpatient dialysis has been already set.  If patient remains stable overnight will plan discharge tomorrow.    #2 hypertension continue hydralazine Coreg and amlodipine.  #3 leukocytosis resolved.  #4 hyperlipidemia on Crestor.  #5 history of COPD stable    Estimated body mass index is 27.46 kg/m as calculated from the following:   Height as of this encounter: 5\' 2"  (1.575 m).   Weight as of this encounter: 68.1 kg.  DVT prophylaxis: Heparin Code Status: DNR Family Communication: None at bedside Disposition Plan:  Status is: Inpatient   Dispo: The patient is from: Home              Anticipated d/c is to: Home               Anticipated d/c date is: 1 day              Patient currently is not medically stable to d/c.  Patient getting AV fistula placed today    Consultants: Nephrology vascular surgery  Procedures: None Antimicrobials none Subjective:  Resting in bed anxious to have the procedure denies any new complaints Objective: Vitals:   08/07/20 1455 08/07/20 1530 08/07/20 1600 08/07/20 1630  BP: (!) 131/49 (!) 141/49 (!) 156/54 (!) 149/50  Pulse: 76 76 76 70  Resp: 19 19 18 16   Temp:      TempSrc:      SpO2:      Weight:      Height:        Intake/Output Summary (Last 24 hours) at 08/07/2020 1704 Last data filed at 08/07/2020 1232 Gross per 24 hour  Intake --  Output 10 ml  Net -10 ml   Filed Weights   08/05/20 1038 08/07/20 1021 08/07/20 1448  Weight: 68.1 kg 68.1 kg 68.1 kg    Examination:  General exam: Appears calm and comfortable  Respiratory system: Clear to auscultation. Respiratory effort normal. Cardiovascular system: S1 & S2 heard, RRR. No JVD, murmurs, rubs, gallops or clicks. No pedal edema. Gastrointestinal system: Abdomen is nondistended, soft and nontender. No organomegaly or masses felt. Normal bowel sounds heard. Central nervous system: Alert and oriented. No focal neurological deficits. Extremities: Symmetric 5 x 5 power.  Skin: No rashes, lesions or ulcers Psychiatry: Judgement and insight appear normal. Mood & affect appropriate.     Data Reviewed: I have personally reviewed following labs and imaging studies  CBC: Recent Labs  Lab 08/02/20 1011 08/02/20 1011 08/03/20 0814 08/04/20 0133 08/05/20 0035 08/06/20 0059 08/07/20 0011  WBC 10.1   < > 8.9 9.0 8.3 8.6 8.7  NEUTROABS 8.1*  --   --  6.6 6.0 5.9 6.3  HGB 9.5*   < > 8.6* 9.3* 8.8* 9.0* 9.5*  HCT 29.4*   < > 27.5* 28.7* 27.2* 28.5* 28.9*  MCV 95.8   < > 95.8 96.0 95.1 96.3 95.1  PLT 141*   < > 164 221 238 236 272   < > = values in this interval not displayed.   Basic Metabolic Panel: Recent  Labs  Lab 08/05/20 0657 08/06/20 0059 08/07/20 0011  NA 131* 133* 130*  K 3.6 3.9 3.8  CL 93* 97* 95*  CO2 26 28 25   GLUCOSE 104* 109* 143*  BUN 35* 14 19  CREATININE 5.53* 3.67* 4.65*  CALCIUM 8.8* 8.4*   8.1* 8.8*  PHOS 5.1* 2.9  --    GFR: Estimated Creatinine Clearance: 9.2 mL/min (A) (by C-G formula based on SCr of 4.65 mg/dL (H)). Liver Function Tests: Recent Labs  Lab 08/05/20 0657 08/06/20 0059  ALBUMIN 2.2* 2.4*   No results for input(s): LIPASE, AMYLASE in the last 168 hours. No results for input(s): AMMONIA in the last 168 hours. Coagulation Profile: Recent Labs  Lab 08/07/20 0011  INR 1.1   Cardiac Enzymes: No results for input(s): CKTOTAL, CKMB, CKMBINDEX, TROPONINI in the last 168 hours. BNP (last 3 results) No results for input(s): PROBNP in the last 8760 hours. HbA1C: No results for input(s): HGBA1C in the last 72 hours. CBG: Recent Labs  Lab 08/07/20 1239  GLUCAP 101*   Lipid Profile: No results for input(s): CHOL, HDL, LDLCALC, TRIG, CHOLHDL, LDLDIRECT in the last 72 hours. Thyroid Function Tests: No results for input(s): TSH, T4TOTAL, FREET4, T3FREE, THYROIDAB in the last 72 hours. Anemia Panel: No results for input(s): VITAMINB12, FOLATE, FERRITIN, TIBC, IRON, RETICCTPCT in the last 72 hours. Sepsis Labs: No results for input(s): PROCALCITON, LATICACIDVEN in the last 168 hours.  Recent Results (from the past 240 hour(s))  Surgical pcr screen     Status: None   Collection Time: 08/06/20  6:53 PM   Specimen: Nasal Mucosa; Nasal Swab  Result Value Ref Range Status   MRSA, PCR NEGATIVE NEGATIVE Final   Staphylococcus aureus NEGATIVE NEGATIVE Final    Comment: (NOTE) The Xpert SA Assay (FDA approved for NASAL specimens in patients 59 years of age and older), is one component of a comprehensive surveillance program. It is not intended to diagnose infection nor to guide or monitor treatment. Performed at [facility], [place]  [address], [place], [place] [address]          Radiology Studies: No results found.      Scheduled Meds:  amLODipine  10 mg Oral Daily   carvedilol  12.5 mg Oral BID WC   chlorhexidine       Chlorhexidine Gluconate Cloth  6 each Topical Q0600   Chlorhexidine Gluconate Cloth  6 each Topical Q0600   darbepoetin (ARANESP) injection - DIALYSIS  40 mcg Intravenous Q Mon-HD   heparin  5,000 Units Subcutaneous Q8H   hydrALAZINE  100 mg Oral Q8H   multivitamin with minerals  1 tablet Oral Daily  polyethylene glycol  17 g Oral BID   rosuvastatin  10 mg Oral Daily   senna-docusate  2 tablet Oral BID   Continuous Infusions:  sodium chloride 250 mL/hr at 08/07/20 1232     LOS: 11 days      Georgette Shell, MD 08/07/2020, 5:04 PM

## 2020-08-07 NOTE — Anesthesia Procedure Notes (Signed)
Procedure Name: MAC Date/Time: 08/07/2020 11:22 AM Performed by: Imagene Riches, CRNA Pre-anesthesia Checklist: Patient identified, Emergency Drugs available, Suction available, Patient being monitored and Timeout performed Patient Re-evaluated:Patient Re-evaluated prior to induction Oxygen Delivery Method: Simple face mask

## 2020-08-07 NOTE — Transfer of Care (Addendum)
Immediate Anesthesia Transfer of Care Note  Patient: Cassandra Dyer  Procedure(s) Performed: LEFT ARM ARTERIOVENOUS (AV) FISTULA CREATION (Left Arm Lower)  Patient Location: PACU  Anesthesia Type:General  Level of Consciousness: drowsy  Airway & Oxygen Therapy: Patient Spontanous Breathing and Patient connected to face mask oxygen  Post-op Assessment: Report given to RN and Post -op Vital signs reviewed and stable  Post vital signs: Reviewed and stable  Last Vitals:  Vitals Value Taken Time  BP 142/53 08/07/20 1238  Temp 36.6 C 08/07/20 1238  Pulse 67 08/07/20 1247  Resp 18 08/07/20 1247  SpO2 97 % 08/07/20 1247  Vitals shown include unvalidated device data.  Last Pain:  Vitals:   08/07/20 1238  TempSrc:   PainSc: 0-No pain         Complications: No complications documented.

## 2020-08-07 NOTE — Plan of Care (Signed)
  Problem: Education: Goal: Knowledge of disease and its progression will improve Outcome: Progressing   Problem: Health Behavior/Discharge Planning: Goal: Ability to manage health-related needs will improve Outcome: Progressing   Problem: Clinical Measurements: Goal: Complications related to the disease process or treatment will be avoided or minimized Outcome: Progressing   Problem: Clinical Measurements: Goal: Dialysis access will remain free of complications Outcome: Progressing   Problem: Activity: Goal: Activity intolerance will improve Outcome: Progressing   Problem: Fluid Volume: Goal: Fluid volume balance will be maintained or improved Outcome: Progressing   Problem: Education: Goal: Knowledge of General Education information will improve Description: Including pain rating scale, medication(s)/side effects and non-pharmacologic comfort measures Outcome: Progressing   Problem: Health Behavior/Discharge Planning: Goal: Ability to manage health-related needs will improve Outcome: Progressing   Problem: Clinical Measurements: Goal: Ability to maintain clinical measurements within normal limits will improve Outcome: Progressing   Problem: Clinical Measurements: Goal: Will remain free from infection Outcome: Progressing   Problem: Clinical Measurements: Goal: Diagnostic test results will improve Outcome: Progressing   Problem: Nutrition: Goal: Adequate nutrition will be maintained Outcome: Progressing   Problem: Pain Managment: Goal: General experience of comfort will improve Outcome: Progressing   Problem: Safety: Goal: Ability to remain free from injury will improve Outcome: Progressing   Problem: Skin Integrity: Goal: Risk for impaired skin integrity will decrease Outcome: Progressing

## 2020-08-07 NOTE — Progress Notes (Signed)
[facility] NEPHROLOGY PROGRESS NOTE  Assessment/ Plan: Pt is a 79 y.o. yo female  HTN, HLD, HCV, COPD, rt nephrectomy in 2018 for RCC p/w nausea and vomiting and AKI on CKD.  # Severe anuric AKI on CKD3 likely progressed to ESRD: S/p rt nephrectomy BL Cr 1.3-1.6 range in recent months presumably chronic component secondary to HTN.  No obvious obstruction. Not a biopsy candidate given solitary kidney.  Initiated dialysis on 8/14.  Serologies without obvious cause of kidney dysfunction but most likely ATN. Receiving dialysis MWF schedule via [place].  Remains anuric without sign of renal recovery.  Vascular was consulted and patient is going to the OR today for permanent access placement. She will need arrangement for outpatient HD.  Discussed with renal navigator.  #Anemia of CKD: Received IV iron.  Continue Aranesp.  Monitor hemoglobin.  #Hypertension/volume: Continue current antihypertensive medication.  Manage volume by dialysis.  #Hyperphosphatemia: Phosphorus level improved, off binders.  PTH level 51.   #Acute diarrhea present on admission: Improving now.  Subjective: Seen and examined.  Remains anuric.  Denies nausea vomiting chest pain shortness of breath.  Plan for permanent access surgery today. Objective Vital signs in last 24 hours: Vitals:   08/06/20 2000 08/06/20 2351 08/07/20 0401 08/07/20 0719  BP:  (!) 146/61 (!) 159/50   Pulse:  72 70   Resp: 17  17   Temp:  97.9 F (36.6 C) 98.2 F (36.8 C) 98.7 F (37.1 C)  TempSrc:  Oral Oral Oral  SpO2:  94% 97%   Weight:      Height:       Weight change:  No intake or output data in the 24 hours ending 08/07/20 0847     Labs: Basic Metabolic Panel: Recent Labs  Lab 07/31/20 1200 07/31/20 1200 08/05/20 0657 08/06/20 0059 08/07/20 0011  NA 139   < > 131* 133* 130*  K 3.7   < > 3.6 3.9 3.8  CL 103   < > 93* 97* 95*  CO2 27   < > 26 28 25   GLUCOSE 92   < > 104* 109* 143*  BUN 37*   < > 35* 14 19   CREATININE 3.83*   < > 5.53* 3.67* 4.65*  CALCIUM 8.4*   < > 8.8* 8.4*  8.1* 8.8*  PHOS 3.6  --  5.1* 2.9  --    < > = values in this interval not displayed.   Liver Function Tests: Recent Labs  Lab 07/31/20 1200 08/05/20 0657 08/06/20 0059  ALBUMIN 2.3* 2.2* 2.4*   No results for input(s): LIPASE, AMYLASE in the last 168 hours. No results for input(s): AMMONIA in the last 168 hours. CBC: Recent Labs  Lab 08/03/20 0814 08/03/20 0814 08/04/20 0133 08/04/20 0133 08/05/20 0035 08/06/20 0059 08/07/20 0011  WBC 8.9   < > 9.0   < > 8.3 8.6 8.7  NEUTROABS  --   --  6.6   < > 6.0 5.9 6.3  HGB 8.6*   < > 9.3*   < > 8.8* 9.0* 9.5*  HCT 27.5*   < > 28.7*   < > 27.2* 28.5* 28.9*  MCV 95.8  --  96.0  --  95.1 96.3 95.1  PLT 164   < > 221   < > 238 236 272   < > = values in this interval not displayed.   Cardiac Enzymes: No results for input(s): CKTOTAL, CKMB, CKMBINDEX, TROPONINI in the last 168 hours.  CBG: No results for input(s): GLUCAP in the last 168 hours.  Iron Studies: No results for input(s): IRON, TIBC, TRANSFERRIN, FERRITIN in the last 72 hours. Studies/Results: VAS [place] UPPER EXT VEIN MAPPING (PRE-OP AVF)  Result Date: 08/06/2020 UPPER EXTREMITY VEIN MAPPING  Indications: Pre-access. Performing Technologist: Griffin Basil RCT RDMS  Examination Guidelines: A complete evaluation includes B-mode imaging, spectral Doppler, color Doppler, and power Doppler as needed of all accessible portions of each vessel. Bilateral testing is considered an integral part of a complete examination. Limited examinations for reoccurring indications may be performed as noted. +-----------------+-------------+----------+--------------+ Right Cephalic   Diameter (cm)Depth (cm)   Findings    +-----------------+-------------+----------+--------------+ Shoulder             0.30        1.70                  +-----------------+-------------+----------+--------------+ Prox upper arm       0.25         1.30                  +-----------------+-------------+----------+--------------+ Mid upper arm        0.21        0.86                  +-----------------+-------------+----------+--------------+ Dist upper arm                          not visualized +-----------------+-------------+----------+--------------+ Antecubital fossa    0.27        0.50                  +-----------------+-------------+----------+--------------+ Prox forearm         0.33        0.51                  +-----------------+-------------+----------+--------------+ Mid forearm          0.33        0.48                  +-----------------+-------------+----------+--------------+ Dist forearm         0.29        0.40                  +-----------------+-------------+----------+--------------+ Wrist                0.20        0.28                  +-----------------+-------------+----------+--------------+ +-----------------+-------------+----------+--------+ Right Basilic    Diameter (cm)Depth (cm)Findings +-----------------+-------------+----------+--------+ Prox upper arm       0.33                        +-----------------+-------------+----------+--------+ Mid upper arm        0.38                        +-----------------+-------------+----------+--------+ Dist upper arm       0.35                        +-----------------+-------------+----------+--------+ Antecubital fossa    0.37                        +-----------------+-------------+----------+--------+ Prox forearm         0.30                        +-----------------+-------------+----------+--------+  Mid forearm          0.23                        +-----------------+-------------+----------+--------+ Distal forearm       0.18                        +-----------------+-------------+----------+--------+ Wrist                0.25                         +-----------------+-------------+----------+--------+ +-----------------+-------------+----------+---------+ Left Cephalic    Diameter (cm)Depth (cm)Findings  +-----------------+-------------+----------+---------+ Shoulder             0.31        1.50             +-----------------+-------------+----------+---------+ Prox upper arm       0.35        0.90             +-----------------+-------------+----------+---------+ Mid upper arm        0.30        0.38             +-----------------+-------------+----------+---------+ Dist upper arm       0.32        0.40             +-----------------+-------------+----------+---------+ Antecubital fossa    0.32        0.34             +-----------------+-------------+----------+---------+ Prox forearm         0.13        0.40   branching +-----------------+-------------+----------+---------+ Mid forearm          0.18        0.32             +-----------------+-------------+----------+---------+ Dist forearm         0.14        0.36             +-----------------+-------------+----------+---------+ Wrist                0.10        0.60             +-----------------+-------------+----------+---------+ +-----------------+-------------+----------+---------+ Left Basilic     Diameter (cm)Depth (cm)Findings  +-----------------+-------------+----------+---------+ Shoulder             0.40                         +-----------------+-------------+----------+---------+ Prox upper arm       0.35                         +-----------------+-------------+----------+---------+ Mid upper arm        0.36                         +-----------------+-------------+----------+---------+ Dist upper arm       0.33                         +-----------------+-------------+----------+---------+ Antecubital fossa    0.33               branching +-----------------+-------------+----------+---------+ Prox forearm          0.19                         +-----------------+-------------+----------+---------+  Mid forearm          0.17                         +-----------------+-------------+----------+---------+ Distal forearm       0.15                         +-----------------+-------------+----------+---------+ Wrist                0.12                         +-----------------+-------------+----------+---------+ *See table(s) above for measurements and observations.  Diagnosing physician: Harold Barban MD Electronically signed by Harold Barban MD on 08/06/2020 at 5:40:50 PM.    Final     Medications: Infusions: . cefUROXime (ZINACEF)  IV      Scheduled Medications: . acetaminophen  1,000 mg Oral Once  . amLODipine  10 mg Oral Daily  . carvedilol  12.5 mg Oral BID WC  . Chlorhexidine Gluconate Cloth  6 each Topical Q0600  . Chlorhexidine Gluconate Cloth  6 each Topical Q0600  . darbepoetin (ARANESP) injection - DIALYSIS  40 mcg Intravenous Q Mon-HD  . heparin  5,000 Units Subcutaneous Q8H  . hydrALAZINE  100 mg Oral Q8H  . multivitamin with minerals  1 tablet Oral Daily  . polyethylene glycol  17 g Oral BID  . rosuvastatin  10 mg Oral Daily  . senna-docusate  2 tablet Oral BID    have reviewed scheduled and prn medications.  Physical Exam: General:NAD, comfortable Heart:RRR, s1s2 nl Lungs: Clear b/l, no crackle Abdomen:soft, Non-tender, non-distended Extremities:No edema Dialysis Access: Right IJ TDC placed by IR on 07/27/2020.  Rody Keadle Prasad Kaelan Amble 08/07/2020,8:47 AM  LOS: 11 days  Pager: [phone]

## 2020-08-07 NOTE — Progress Notes (Signed)
Transported to the OR by bed awake and alert.

## 2020-08-07 NOTE — Evaluation (Signed)
Physical Therapy Evaluation Patient Details Name: Cassandra Dyer MRN: [id number] DOB: 1941/06/20 Today's Date: 08/07/2020   History of Present Illness  Pt is a 79 y.o. female admitted 07/26/20 with vomiting, weakness, cough, BLE swelling. Found to have hyperkalemia, AKI on CKD 3. RIJ tunneled HD catheter placed 8/14 and HD initiated same day. Plan for permanent dialysis access placed 8/25 (L AV fistula vs. graft). PMH includes Hep C, HTN, anemia, kidney CA s/p nephrectomy.    Clinical Impression  Pt presents with an overall decrease in functional mobility secondary to above. PTA, pt independent and lives with boyfriend. Today, pt ambulatory without DME at supervision-level; limited by decreased activity tolerance and fatigue. SpO2 down to 80% on RA; pt required 3L O2 [place] to maintain SpO2 >/88% with activity. Pt would benefit from continued acute PT services to maximize functional mobility and independence prior to d/c home.     Follow Up Recommendations No PT follow up;Supervision - Intermittent    Equipment Recommendations  None recommended by PT    Recommendations for Other Services       Precautions / Restrictions Precautions Precautions: Fall;Other (comment) Precaution Comments: Not on home O2, but needed 3L O2 during session Restrictions Weight Bearing Restrictions: No      Mobility  Bed Mobility Overal bed mobility: Modified Independent             General bed mobility comments: HOB slightly elevated  Transfers Overall transfer level: Needs assistance Equipment used: None Transfers: Sit to/from Stand Sit to Stand: Supervision         General transfer comment: Able to stand from EOB and low toilet height, reliant on grab bar from low toilet seat; supervision for safety with lines  Ambulation/Gait Ambulation/Gait assistance: Supervision Gait Distance (Feet): 24 Feet Assistive device: None Gait Pattern/deviations: Step-through pattern;Decreased stride  length Gait velocity: Decreased   General Gait Details: Slow, fatigued gait in room without DME, reaching for UE support on furniture, limited by fatigue; SpO2 down to 80% on RA, requiring 3L O2 to maintain >/88%  Stairs            Wheelchair Mobility    Modified Rankin (Stroke Patients Only)       Balance Overall balance assessment: Needs assistance   Sitting balance-Leahy Scale: Good       Standing balance-Leahy Scale: Fair                               Pertinent Vitals/Pain Pain Assessment: Faces Faces Pain Scale: Hurts a little bit Pain Location: Generalized Pain Descriptors / Indicators: Tiring Pain Intervention(s): Monitored during session;Limited activity within patient's tolerance    Home Living Family/patient expects to be discharged to:: Private residence Living Arrangements: Spouse/significant other (boyfriend) Available Help at Discharge: Family;Available 24 hours/day Type of Home: House Home Access: Level entry     Home Layout: One level Home Equipment: None      Prior Function Level of Independence: Independent               Hand Dominance   Dominant Hand: Right    Extremity/Trunk Assessment   Upper Extremity Assessment Upper Extremity Assessment: Overall WFL for tasks assessed    Lower Extremity Assessment Lower Extremity Assessment: Overall WFL for tasks assessed       Communication   Communication: No difficulties  Cognition Arousal/Alertness: Awake/alert Behavior During Therapy: WFL for tasks assessed/performed Overall Cognitive Status: Within Functional Limits for  tasks assessed                                        General Comments General comments (skin integrity, edema, etc.): Initiated education on fatigue associated with new HD appointments and energy conservation    Exercises     Assessment/Plan    PT Assessment Patient needs continued PT services  PT Problem List Decreased  activity tolerance;Decreased balance;Decreased mobility;Decreased knowledge of use of DME;Cardiopulmonary status limiting activity       PT Treatment Interventions DME instruction;Gait training;Functional mobility training;Therapeutic activities;Therapeutic exercise;Balance training;Patient/family education    PT Goals (Current goals can be found in the Care Plan section)  Acute Rehab PT Goals Patient Stated Goal: Get some rest PT Goal Formulation: With patient Time For Goal Achievement: 08/21/20 Potential to Achieve Goals: Good    Frequency Min 3X/week   Barriers to discharge        Co-evaluation               AM-PAC PT "6 Clicks" Mobility  Outcome Measure Help needed turning from your back to your side while in a flat bed without using bedrails?: None Help needed moving from lying on your back to sitting on the side of a flat bed without using bedrails?: None Help needed moving to and from a bed to a chair (including a wheelchair)?: None Help needed standing up from a chair using your arms (e.g., wheelchair or bedside chair)?: None Help needed to walk in hospital room?: None Help needed climbing 3-5 steps with a railing? : A Little 6 Click Score: 23    End of Session   Activity Tolerance: Patient tolerated treatment well;Patient limited by fatigue Patient left: in bed;with call bell/phone within reach Nurse Communication: Mobility status PT Visit Diagnosis: Other abnormalities of gait and mobility (R26.89)    Time: 8381-8403 PT Time Calculation (min) (ACUTE ONLY): 20 min   Charges:   PT Evaluation $PT Eval Moderate Complexity: [place], PT, DPT Acute Rehabilitation Services  Pager [phone] Office [place] 08/07/2020, 9:08 AM

## 2020-08-07 NOTE — Interval H&P Note (Signed)
History and Physical Interval Note:  08/07/2020 10:23 AM  Cassandra Dyer  has presented today for surgery, with the diagnosis of END STAGE RENAL DISEASE.  The various methods of treatment have been discussed with the patient and family. After consideration of risks, benefits and other options for treatment, the patient has consented to  Procedure(s): LEFT ARM ARTERIOVENOUS (AV) FISTULA CREATION (Left) as a surgical intervention.  The patient's history has been reviewed, patient examined, no change in status, stable for surgery.  I have reviewed the patient's chart and labs.  Questions were answered to the patient's satisfaction.     Curt Jews

## 2020-08-08 ENCOUNTER — Encounter (HOSPITAL_COMMUNITY): Payer: Self-pay | Admitting: Vascular Surgery

## 2020-08-08 LAB — CBC WITH DIFFERENTIAL/PLATELET
Abs Immature Granulocytes: 0.05 10*3/uL (ref 0.00–0.07)
Basophils Absolute: 0 10*3/uL (ref 0.0–0.1)
Basophils Relative: 0 %
Eosinophils Absolute: 0.1 10*3/uL (ref 0.0–0.5)
Eosinophils Relative: 2 %
HCT: 28.9 % — ABNORMAL LOW (ref 36.0–46.0)
Hemoglobin: 9.1 g/dL — ABNORMAL LOW (ref 12.0–15.0)
Immature Granulocytes: 1 %
Lymphocytes Relative: 8 %
Lymphs Abs: 0.7 10*3/uL (ref 0.7–4.0)
MCH: 30.2 pg (ref 26.0–34.0)
MCHC: 31.5 g/dL (ref 30.0–36.0)
MCV: 96 fL (ref 80.0–100.0)
Monocytes Absolute: 1.5 10*3/uL — ABNORMAL HIGH (ref 0.1–1.0)
Monocytes Relative: 18 %
Neutro Abs: 5.9 10*3/uL (ref 1.7–7.7)
Neutrophils Relative %: 71 %
Platelets: 280 10*3/uL (ref 150–400)
RBC: 3.01 MIL/uL — ABNORMAL LOW (ref 3.87–5.11)
RDW: 14.4 % (ref 11.5–15.5)
WBC: 8.2 10*3/uL (ref 4.0–10.5)
nRBC: 0 % (ref 0.0–0.2)

## 2020-08-08 LAB — BASIC METABOLIC PANEL
Anion gap: 8 (ref 5–15)
BUN: 11 mg/dL (ref 8–23)
CO2: 28 mmol/L (ref 22–32)
Calcium: 8.5 mg/dL — ABNORMAL LOW (ref 8.9–10.3)
Chloride: 97 mmol/L — ABNORMAL LOW (ref 98–111)
Creatinine, Ser: 3.26 mg/dL — ABNORMAL HIGH (ref 0.44–1.00)
GFR calc Af Amer: 15 mL/min — ABNORMAL LOW (ref >60)
GFR calc non Af Amer: 13 mL/min — ABNORMAL LOW (ref >60)
Glucose, Bld: 104 mg/dL — ABNORMAL HIGH (ref 70–99)
Potassium: 3.9 mmol/L (ref 3.5–5.1)
Sodium: 133 mmol/L — ABNORMAL LOW (ref 135–145)

## 2020-08-08 MED ORDER — HYDRALAZINE HCL 100 MG PO TABS
100.0000 mg | ORAL_TABLET | Freq: Three times a day (TID) | ORAL | 4 refills | Status: DC
Start: 2020-08-08 — End: 2020-09-12

## 2020-08-08 MED ORDER — SENNOSIDES-DOCUSATE SODIUM 8.6-50 MG PO TABS: 2.0000 | ORAL_TABLET | Freq: Two times a day (BID) | ORAL | Status: DC

## 2020-08-08 MED ORDER — CARVEDILOL 12.5 MG PO TABS
12.5000 mg | ORAL_TABLET | Freq: Two times a day (BID) | ORAL | 1 refills | Status: DC
Start: 2020-08-08 — End: 2020-11-28

## 2020-08-08 MED ORDER — POLYETHYLENE GLYCOL 3350 17 G PO PACK: 17.0000 g | PACK | Freq: Two times a day (BID) | ORAL | 0 refills | Status: DC

## 2020-08-08 MED ORDER — CHLORHEXIDINE GLUCONATE CLOTH 2 % EX PADS
6.0000 | MEDICATED_PAD | Freq: Every day | CUTANEOUS | Status: DC
  Administered 2020-08-08: 6 via TOPICAL

## 2020-08-08 NOTE — Progress Notes (Signed)
[facility] NEPHROLOGY PROGRESS NOTE  Assessment/ Plan: Pt is a 79 y.o. yo female  HTN, HLD, HCV, COPD, rt nephrectomy in 2018 for RCC p/w nausea and vomiting and AKI on CKD.  # Severe anuric AKI on CKD3 likely progressed to ESRD: S/p rt nephrectomy BL Cr 1.3-1.6 range in recent months presumably chronic component secondary to HTN.  No obvious obstruction. Not a biopsy candidate given solitary kidney.  Initiated dialysis on 8/14.  Serologies without obvious cause of kidney dysfunction but most likely ATN. Receiving dialysis MWF schedule via [place].  Remains anuric without sign of renal recovery.  She had dialysis yesterday with 1.5 L UF. Status post left brachiocephalic AV fistula creation by Dr. Donnetta Hutching on 8/26.  Wound healing well. Outpatient HD arrangement at [facility] for HD tomorrow which can be done as outpatient.  Okay to discharge from nephrology perspective.  #Anemia of CKD: Received IV iron.  Continue Aranesp.  Monitor hemoglobin.  #Hypertension/volume: Continue current antihypertensive medication.  Manage volume by dialysis.  #Hyperphosphatemia: Phosphorus level improved, off binders.  PTH level 51.   #Acute diarrhea present on admission: Improving now.  Subjective: Seen and examined.  Remains anuric.  She had AV fistula surgery yesterday.  Denies nausea vomiting chest pain shortness of breath. Objective Vital signs in last 24 hours: Vitals:   08/07/20 2221 08/07/20 2349 08/08/20 0407 08/08/20 0626  BP: (!) 138/54 (!) 152/60 (!) 160/54 (!) 163/60  Pulse:  81 84 82  Resp:  (!) 25 (!) 23   Temp:  97.8 F (36.6 C) 99.3 F (37.4 C)   TempSrc:  Oral Oral   SpO2:  93% 92%   Weight:      Height:       Weight change:   Intake/Output Summary (Last 24 hours) at 08/08/2020 0729 Last data filed at 08/07/2020 2349 Gross per 24 hour  Intake 480 ml  Output 1510 ml  Net -1030 ml       Labs: Basic Metabolic Panel: Recent Labs  Lab 08/05/20 0657  08/06/20 0059 08/07/20 0011  NA 131* 133* 130*  K 3.6 3.9 3.8  CL 93* 97* 95*  CO2 26 28 25   GLUCOSE 104* 109* 143*  BUN 35* 14 19  CREATININE 5.53* 3.67* 4.65*  CALCIUM 8.8* 8.4*  8.1* 8.8*  PHOS 5.1* 2.9  --    Liver Function Tests: Recent Labs  Lab 08/05/20 0657 08/06/20 0059  ALBUMIN 2.2* 2.4*   No results for input(s): LIPASE, AMYLASE in the last 168 hours. No results for input(s): AMMONIA in the last 168 hours. CBC: Recent Labs  Lab 08/04/20 0133 08/04/20 0133 08/05/20 0035 08/05/20 0035 08/06/20 0059 08/07/20 0011 08/08/20 0029  WBC 9.0   < > 8.3   < > 8.6 8.7 8.2  NEUTROABS 6.6   < > 6.0   < > 5.9 6.3 5.9  HGB 9.3*   < > 8.8*   < > 9.0* 9.5* 9.1*  HCT 28.7*   < > 27.2*   < > 28.5* 28.9* 28.9*  MCV 96.0  --  95.1  --  96.3 95.1 96.0  PLT 221   < > 238   < > 236 272 280   < > = values in this interval not displayed.   Cardiac Enzymes: No results for input(s): CKTOTAL, CKMB, CKMBINDEX, TROPONINI in the last 168 hours. CBG: Recent Labs  Lab 08/07/20 1239  GLUCAP 101*    Iron Studies: No results for input(s): IRON, TIBC,  TRANSFERRIN, FERRITIN in the last 72 hours. Studies/Results: No results found.  Medications: Infusions: . sodium chloride 250 mL/hr at 08/07/20 1232    Scheduled Medications: . amLODipine  10 mg Oral Daily  . carvedilol  12.5 mg Oral BID WC  . Chlorhexidine Gluconate Cloth  6 each Topical Q0600  . Chlorhexidine Gluconate Cloth  6 each Topical Q0600  . darbepoetin (ARANESP) injection - DIALYSIS  40 mcg Intravenous Q Mon-HD  . heparin  5,000 Units Subcutaneous Q8H  . hydrALAZINE  100 mg Oral Q8H  . multivitamin with minerals  1 tablet Oral Daily  . polyethylene glycol  17 g Oral BID  . rosuvastatin  10 mg Oral Daily  . senna-docusate  2 tablet Oral BID    have reviewed scheduled and prn medications.  Physical Exam: General:NAD, comfortable Heart:RRR, s1s2 nl Lungs: Clear b/l, no crackle Abdomen:soft, Non-tender,  non-distended Extremities:No edema Dialysis Access: Right IJ TDC placed by IR on 07/27/2020.  Left AV fistula site wound healing well, has good thrill.  [place] Berkley Prasad Onofre Gains 08/08/2020,7:29 AM  LOS: 12 days  Pager: [phone]

## 2020-08-08 NOTE — Progress Notes (Signed)
Physical Therapy Treatment Patient Details Name: Cassandra Dyer MRN: [id number] DOB: 04/10/1941 Today's Date: 08/08/2020    History of Present Illness Pt is a 79 y.o. female admitted 07/26/20 with vomiting, weakness, cough, BLE swelling. Found to have hyperkalemia, AKI on CKD 3. RIJ tunneled HD catheter placed 8/14 and HD initiated same day. LUE permanent dialysis access placed 8/25. PMH includes Hep C, HTN, anemia, kidney CA s/p nephrectomy.    PT Comments    Pt making good progress. Needs supplemental O2. Eager for [employer] home.    Follow Up Recommendations  No PT follow up;Supervision - Intermittent     Equipment Recommendations  Other (comment) (O2)    Recommendations for Other Services       Precautions / Restrictions Precautions Precautions: Fall;Other (comment) Precaution Comments: watch SpO2    Mobility  Bed Mobility Overal bed mobility: Modified Independent             General bed mobility comments: HOB slightly elevated  Transfers Overall transfer level: Needs assistance Equipment used: 4-wheeled walker;None Transfers: Sit to/from Stand Sit to Stand: Supervision         General transfer comment: supervision for safety and lines  Ambulation/Gait Ambulation/Gait assistance: Supervision Gait Distance (Feet): 150 Feet Assistive device: None;4-wheeled walker Gait Pattern/deviations: Step-through pattern;Decreased stride length Gait velocity: Decreased Gait velocity interpretation: 1.31 - 2.62 ft/sec, indicative of limited community ambulator General Gait Details: Used rollator in hallway for support   Marine scientist Rankin (Stroke Patients Only)       Balance Overall balance assessment: Needs assistance Sitting-balance support: No upper extremity supported;Feet supported Sitting balance-Leahy Scale: Good     Standing balance support: No upper extremity supported;During functional activity Standing  balance-Leahy Scale: Fair                              Cognition Arousal/Alertness: Awake/alert Behavior During Therapy: WFL for tasks assessed/performed Overall Cognitive Status: Within Functional Limits for tasks assessed                                        Exercises      General Comments        Pertinent Vitals/Pain Pain Assessment: Faces Faces Pain Scale: Hurts even more Pain Location: LUE Pain Descriptors / Indicators: Sore Pain Intervention(s): Limited activity within patient's tolerance;Monitored during session    Home Living                      Prior Function            PT Goals (current goals can now be found in the care plan section) Acute Rehab PT Goals Patient Stated Goal: Get some rest Progress towards PT goals: Progressing toward goals    Frequency    Min 3X/week      PT Plan Current plan remains appropriate    Co-evaluation              AM-PAC PT "6 Clicks" Mobility   Outcome Measure  Help needed turning from your back to your side while in a flat bed without using bedrails?: None Help needed moving from lying on your back to sitting on the side of a flat bed without using bedrails?: None Help  needed moving to and from a bed to a chair (including a wheelchair)?: None Help needed standing up from a chair using your arms (e.g., wheelchair or bedside chair)?: None Help needed to walk in hospital room?: None Help needed climbing 3-5 steps with a railing? : A Little 6 Click Score: 23    End of Session Equipment Utilized During Treatment: Oxygen Activity Tolerance: Patient tolerated treatment well Patient left: in chair;with call bell/phone within reach Nurse Communication: Mobility status PT Visit Diagnosis: Other abnormalities of gait and mobility (R26.89)     Time: 6004-5997 PT Time Calculation (min) (ACUTE ONLY): 19 min  Charges:  $Gait Training: 8-22 mins                     [place] Pager [phone] Office [place] 08/08/2020, 1:21 PM

## 2020-08-08 NOTE — Progress Notes (Signed)
Discharge instructions (including medications) discussed with and copy provided to patient/caregiver 

## 2020-08-08 NOTE — TOC Initial Note (Signed)
Transition of Care [facility]) - Initial/Assessment Note    Patient Details  Name: Cassandra Dyer MRN: [id number] Date of Birth: 10-28-1941  Transition of [facility]) CM/SW Contact:    Zenon Mayo, RN Phone Number: 08/08/2020, 12:44 PM  Clinical Narrative:                 Patient is for dc today, she will need home oxygen, she states [employer] is ok to work with for the oxygen,  NCM made referral to [facility] with [employer] for the oxygen.  Expected Discharge Plan: Home/Self Care Barriers to Discharge: No Barriers Identified   Patient Goals and CMS Choice Patient states their goals for this hospitalization and ongoing recovery are:: get better   Choice offered to / list presented to : NA  Expected Discharge Plan and Services Expected Discharge Plan: Home/Self Care   Discharge Planning Services: CM Consult Post Acute Care Choice: NA Living arrangements for the past 2 months: Single Family Home Expected Discharge Date: 08/08/20               DME Arranged: Oxygen DME Agency: [employer] Date DME Agency Contacted: 08/08/20 Time DME Agency Contacted: [phone] Representative spoke with at DME Agency: [place]: NA          Prior Living Arrangements/Services Living arrangements for the past 2 months: [place] Lives with:: Significant Other Patient language and need for interpreter reviewed:: Yes Do you feel safe going back to the place where you live?: Yes      Need for Family Participation in Patient Care: No (Comment) Care giver support system in place?: No (comment)   Criminal Activity/Legal Involvement Pertinent to Current Situation/Hospitalization: No - Comment as needed  Activities of Daily Living      Permission Sought/Granted                  Emotional Assessment   Attitude/Demeanor/Rapport: Engaged Affect (typically observed): Appropriate Orientation: : Oriented to Self, Oriented to Place, Oriented to  Time, Oriented to Situation Alcohol /  Substance Use: Not Applicable Psych Involvement: No (comment)  Admission diagnosis:  Hyperkalemia [E87.5] SOB (shortness of breath) [R06.02] Renal failure (ARF), acute on chronic (HCC) [N17.9, N18.9] Hyperkalemia, diminished renal excretion [E87.5] Acute renal failure, unspecified acute renal failure type ([place]) [N17.9] Patient Active Problem List   Diagnosis Date Noted  . Cough 08/04/2020  . Thrombocytopenia ([place]) 08/02/2020  . Anemia 08/02/2020  . Hyperkalemia, diminished renal excretion 07/27/2020  . Mixed hyperlipidemia 07/27/2020  . Metabolic acidosis 51/88/4166  . CKD (chronic kidney disease) 05/23/2020  . Solitary kidney, acquired 05/23/2020  . Fever 04/26/2020  . Acute hypoxemic respiratory failure ([place]) 04/19/2020  . Diarrhea 04/19/2020  . Nausea & vomiting 04/19/2020  . Pneumonia 04/09/2020  . Acute kidney injury (AKI) with acute tubular necrosis (ATN) ([place]) 04/09/2020  . Hypertensive urgency 04/09/2020  . History of hepatitis C 04/09/2020  . Sepsis ([place]) 04/08/2020  . Venous insufficiency 12/28/2019  . Hyperglycemia 06/29/2019  . Osteoporosis 02/20/2018  . Essential hypertension 02/15/2017  . Dyslipidemia 02/15/2017  . Renal cell carcinoma of right kidney ([place]) 02/15/2017   PCP:  Biagio Borg, MD Pharmacy:   [facility] ([place]), [place] - [address] [place] ([place]) [place] [address] Phone: [phone] Fax: [phone]     Social Determinants of Health (SDOH) Interventions Food Insecurity Interventions: Intervention Not Indicated Transportation Interventions: Intervention Not Indicated  Readmission Risk Interventions Readmission Risk Prevention Plan  08/08/2020  Transportation Screening Complete  Medication Review Press photographer) Complete  PCP or Specialist appointment within 3-5 days of discharge Complete  HRI or [place] Complete  SW Recovery Care/Counseling Consult Complete  [place] Not Applicable  Some recent data might be hidden

## 2020-08-08 NOTE — Plan of Care (Signed)
  Problem: Education: Goal: Knowledge of disease and its progression will improve Outcome: Adequate for Discharge

## 2020-08-08 NOTE — Progress Notes (Signed)
SATURATION QUALIFICATIONS: (This note is used to comply with regulatory documentation for home oxygen)  Patient Saturations on Room Air at Rest = 85%  Patient Saturations on Room Air while Ambulating = Not tested due to low SpO2 at rest on room air  Patient Saturations on 3 Liters of oxygen while Ambulating = 92%  Please briefly explain why patient needs home oxygen:Pt unable to maintain adequate oxygenation at rest or with activity without supplemental O2.   [place] Pager [phone] Office [phone]

## 2020-08-08 NOTE — Progress Notes (Signed)
Renal Navigator notified OP HD clinic/[place] of patient's discharge today to start in the clinic tomorrow. Patient aware of plan. Navigator notified Renal PA/D. Zey[place]ang to request orders to be sent to clinic.  Alphonzo Cruise, [place] Renal Navigator [phone]

## 2020-08-08 NOTE — Plan of Care (Signed)
  Problem: Education: Goal: Knowledge of disease and its progression will improve 08/08/2020 1541 by Roma Kayser, RN Outcome: Adequate for Discharge 08/08/2020 1541 by Roma Kayser, RN Outcome: Adequate for Discharge

## 2020-08-08 NOTE — Discharge Summary (Addendum)
Physician Discharge Summary  Cassandra Dyer UJW:[id number] DOB: 07/27/1941 DOA: 07/26/2020  PCP: Biagio Borg, MD  Admit date: 07/26/2020 Discharge date: 08/08/2020  Admitted From: Home Disposition: Home Recommendations for Outpatient Follow-up:  1. Follow up with PCP in 1-2 weeks 2. Please obtain BMP/CBC in one week 3. Please follow up with vascular surgery/nephrology  Home Health: None Equipment/Devices: None Discharge Condition: Stable CODE STATUS DO NOT RESUSCITATE Diet recommendation: Renal cardiac diet Brief/Interim Summary:Cassandra R [name] a 79 y.o.year old femalewith medical history significant for HTN, HLD, CKD stage III, hepatitis C, chronic anemia, COPD, right nephrectomy in 2018 for renal cell cancer who presented on 8/13/2021with shortness of breath, vomiting, diarrhea and generalized weakness for the past week, as well as intermittent episodes of lower extremity swelling was found to have creatinine of 12 (previous baseline of 1.3), potassium of 7.2, CO2 12, GFR 3, white count 12.8 concerning for AKI on CKD  Hospital course complicated by initiation of dialysis on 8/14. Patient has unfortunately had no signs of renal revoery with intermittent dialysis.    Discharge Diagnoses:  Principal Problem:   Acute kidney injury (AKI) with acute tubular necrosis (ATN) (HCC) Active Problems:   Essential hypertension   Hyperkalemia, diminished renal excretion   Mixed hyperlipidemia   Metabolic acidosis   Thrombocytopenia (HCC)   Anemia   Cough  Severe oliguric AKI on CKD stage III in setting of prior remote right nephrectomy, with no signs of renal recovery likely progression to ESRD.  Unclear if this is progression of CKD likely due to HTN, possible acute exacerbation related to preceding symptoms of nausea, vomiting, diarrhea though that was only for short period of time.  Suspect the symptoms were likely uremic related.  Renal ultrasound showed no obvious obstruction, has  not responded well to Lasix, not a biopsy candidate given solitary kidney per nephrology.  ANA positive, C3 mildly decreased otherwise normal serologies. -Continue dialysis via [facility], Monday Wednesday Friday -Vascular placed left AV fistula 08/07/2020 --Nephrology planning for outpatient HD, f/u pathology, outpatient hemodialysis already set up.  Patient to get her next dialysis session 08/09/2020.  Thrombocytopenia, resolved.  On presentation on 8/13 platelet count of 240 2K.  Had previous episodes of thrombocytopenia back in April 2021 with nadir of 70 K.   Previously 108 8K on 8/19, now wnl, no signs or symptoms of bleeding  Hyperkalemia, resolved.  With calcium gluconate, Lokelma, insulin, bicarb on admission.  Normocytic anemia, hemoglobin stable.  Iron panel consistent with iron deficiency status post IV iron  HTN, improved control , at goal  -Hydralazine 100 mg 3 times daily -Discontinued metoprolol in favor of Coreg 12.5 mg twice daily -Norvasc 10 mg daily  Leukocytosis, mild, resolved.  likely stress leukocytosis.  Remains afebrile  HLD, stable -Continue Crestor  Acute diarrhea- resolved  History of COPD, no wheezing on exam -As needed albuterol inhaler Estimated body mass index is 26.85 kg/m as calculated from the following:   Height as of this encounter: 5\' 2"  (1.575 m).   Weight as of this encounter: 66.6 kg.  Discharge Instructions  Discharge Instructions    Diet - low sodium heart healthy   Complete by: As directed    Discharge wound care:   Complete by: As directed    Keep area clean and dry   Increase activity slowly   Complete by: As directed      Allergies as of 08/08/2020   No Known Allergies     Medication List  STOP taking these medications   amLODipine 10 MG tablet Commonly known as: NORVASC   doxycycline 100 MG tablet Commonly known as: VIBRA-TABS   hydrochlorothiazide 25 MG tablet Commonly known as: HYDRODIURIL   metoprolol  succinate 100 MG 24 hr tablet Commonly known as: TOPROL-XL   telmisartan 80 MG tablet Commonly known as: MICARDIS     TAKE these medications   benzonatate 200 MG capsule Commonly known as: TESSALON Take 1 capsule (200 mg total) by mouth 3 (three) times daily as needed.   Black Currant Seed Oil 500 MG Caps Take 500 mg by mouth daily.   CALCIUM 1200 PO Take 1,200 mg by mouth daily.   carvedilol 12.5 MG tablet Commonly known as: COREG Take 1 tablet (12.5 mg total) by mouth 2 (two) times daily with a meal.   FISH OIL PO Take 1 tablet by mouth daily.   Garlic 3500 MG Caps Take 2,000 mg by mouth daily.   hydrALAZINE 100 MG tablet Commonly known as: APRESOLINE Take 1 tablet (100 mg total) by mouth every 8 (eight) hours.   multivitamin with minerals Tabs tablet Take 1 tablet by mouth daily.   ondansetron 4 MG tablet Commonly known as: Zofran Take 1 tablet (4 mg total) by mouth every 8 (eight) hours as needed for nausea or vomiting.   OVER THE COUNTER MEDICATION Take 1 capsule by mouth daily. Mega juice   polyethylene glycol 17 g packet Commonly known as: MIRALAX / GLYCOLAX Take 17 g by mouth 2 (two) times daily.   rosuvastatin 10 MG tablet Commonly known as: CRESTOR Take 1 tablet by mouth once daily   senna-docusate 8.6-50 MG tablet Commonly known as: Senokot-S Take 2 tablets by mouth 2 (two) times daily.   VITAMIN C PO Take 500 mg by mouth in the morning and at bedtime.   VITAMIN D PO Take 2,000 mcg by mouth See admin instructions. Takes 2000 mcg 3 times a week ( monday, wednesday and friday)            Discharge Care Instructions  (From admission, onward)         Start     Ordered   08/08/20 0000  Discharge wound care:       Comments: Keep area clean and dry   08/08/20 0938          Follow-up Information    [facility] In 6 weeks.   Specialty: Vascular Surgery Why: Office will call you to arrange your appt (sent) Contact  information: [place] [address] [phone]       Biagio Borg, MD Follow up.   Specialties: Internal Medicine, Radiology Contact information: [place] [address] [phone]        [place] Follow up.   Contact information: [place] [address] [phone]        [name], MD Follow up.   Specialties: Vascular Surgery, Cardiology Contact information: [address] [place] [address] [phone]              No Known Allergies  Consultations:  Vascular surgery and nephrology   Procedures/Studies: DG Chest 1 View  Result Date: 07/26/2020 CLINICAL DATA:  79 year old female with shortness of breath. EXAM: CHEST  1 VIEW COMPARISON:  Chest radiograph dated 04/26/2020 FINDINGS: Small bilateral pleural effusions with bibasilar atelectasis, increased since the prior radiograph. Pneumonia is not excluded. No pneumothorax. The cardiac borders are silhouetted. Atherosclerotic calcification  of the aorta. No acute osseous pathology. IMPRESSION: Small bilateral pleural effusions with bibasilar atelectasis, increased since the prior radiograph. Electronically Signed   By: Anner Crete M.D.   On: 07/26/2020 20:24   DG Chest 2 View  Result Date: 08/04/2020 CLINICAL DATA:  Shortness of breath. EXAM: CHEST - 2 VIEW COMPARISON:  07/26/2020 chest radiograph FINDINGS: Small-moderate bilateral pleural effusions and bibasilar atelectasis again noted with slight decrease in LEFT pleural effusion. The visualized cardiopericardial silhouette is unremarkable. A RIGHT IJ central venous catheter with tip overlying the SUPERIOR cavoatrial junction is noted. There is no evidence of pneumothorax IMPRESSION: Small-moderate bilateral pleural effusions and bibasilar atelectasis again noted with slight decrease in LEFT pleural effusion. Electronically Signed   By: Margarette Canada M.D.   On: 08/04/2020 14:53   US  RENAL  Result Date: 07/27/2020 CLINICAL DATA:  Renal failure. EXAM: RENAL / URINARY TRACT ULTRASOUND COMPLETE COMPARISON:  CT 04/08/2020 FINDINGS: Right Kidney: Surgically absent. Left Kidney: Renal measurements: 9.6 x 5.4 x 4.8 cm = volume: 130 mL. Echogenicity within normal limits. No mass or hydronephrosis visualized. Bladder: Empty and not well assessed. Other: Incidental right pleural effusion. IMPRESSION: 1. Unremarkable sonographic appearance of the left kidney. 2. Right nephrectomy. 3. Urinary bladder is empty and not well assessed. 4. Right pleural effusion incidentally noted. Electronically Signed   By: Keith Rake M.D.   On: 07/27/2020 04:04   IR Fluoro Guide CV Line Right  Result Date: 07/27/2020 CLINICAL DATA:  Acute kidney injury, uremia and need for emergent hemodialysis. Request has been made to place a tunneled hemodialysis catheter due to predicted need for longer term hemodialysis. EXAM: TUNNELED CENTRAL VENOUS HEMODIALYSIS CATHETER PLACEMENT WITH ULTRASOUND AND FLUOROSCOPIC GUIDANCE ANESTHESIA/SEDATION: 2.0 mg IV Versed; 50 mcg IV Fentanyl. Total Moderate Sedation Time:   20 minutes. The patient's level of consciousness and physiologic status were continuously monitored during the procedure by Radiology nursing. MEDICATIONS: 2 g IV Ancef. FLUOROSCOPY TIME:  24 seconds.  2.0 mGy. PROCEDURE: The procedure, risks, benefits, and alternatives were explained to the patient. Questions regarding the procedure were encouraged and answered. The patient understands and consents to the procedure. A timeout was performed prior to initiating the procedure. The right neck and chest were prepped with chlorhexidine in a sterile fashion, and a sterile drape was applied covering the operative field. Maximum barrier sterile technique with sterile gowns and gloves were used for the procedure. Local anesthesia was provided with 1% lidocaine. Ultrasound was used to confirm patency of the right internal  jugular vein. After creating a small venotomy incision, a 21 gauge needle was advanced into the right internal jugular vein under direct, real-time ultrasound guidance. Ultrasound image documentation was performed. After securing guidewire access, an 8 Fr dilator was placed. A J-wire was kinked to measure appropriate catheter length. A Palindrome tunneled hemodialysis catheter measuring 19 cm from tip to cuff was chosen for placement. This was tunneled in a retrograde fashion from the chest wall to the venotomy incision. At the venotomy, serial dilatation was performed and a 15 Fr peel-away sheath was placed over a guidewire. The catheter was then placed through the sheath and the sheath removed. Final catheter positioning was confirmed and documented with a fluoroscopic spot image. The catheter was aspirated, flushed with saline, and injected with appropriate volume heparin dwells. The venotomy incision was closed with subcuticular 4-0 Vicryl. Dermabond was applied to the incision. The catheter exit site was secured with 0-Prolene retention sutures. COMPLICATIONS: None.  No pneumothorax. FINDINGS:  After catheter placement, the tip lies in the right atrium. The catheter aspirates normally and is ready for immediate use. IMPRESSION: Placement of tunneled hemodialysis catheter via the right internal jugular vein. The catheter tip lies in the right atrium. The catheter is ready for immediate use. Electronically Signed   By: Aletta Edouard M.D.   On: 07/27/2020 15:00   IR US Guide Vasc Access Right  Result Date: 07/27/2020 CLINICAL DATA:  Acute kidney injury, uremia and need for emergent hemodialysis. Request has been made to place a tunneled hemodialysis catheter due to predicted need for longer term hemodialysis. EXAM: TUNNELED CENTRAL VENOUS HEMODIALYSIS CATHETER PLACEMENT WITH ULTRASOUND AND FLUOROSCOPIC GUIDANCE ANESTHESIA/SEDATION: 2.0 mg IV Versed; 50 mcg IV Fentanyl. Total Moderate Sedation Time:   20  minutes. The patient's level of consciousness and physiologic status were continuously monitored during the procedure by Radiology nursing. MEDICATIONS: 2 g IV Ancef. FLUOROSCOPY TIME:  24 seconds.  2.0 mGy. PROCEDURE: The procedure, risks, benefits, and alternatives were explained to the patient. Questions regarding the procedure were encouraged and answered. The patient understands and consents to the procedure. A timeout was performed prior to initiating the procedure. The right neck and chest were prepped with chlorhexidine in a sterile fashion, and a sterile drape was applied covering the operative field. Maximum barrier sterile technique with sterile gowns and gloves were used for the procedure. Local anesthesia was provided with 1% lidocaine. Ultrasound was used to confirm patency of the right internal jugular vein. After creating a small venotomy incision, a 21 gauge needle was advanced into the right internal jugular vein under direct, real-time ultrasound guidance. Ultrasound image documentation was performed. After securing guidewire access, an 8 Fr dilator was placed. A J-wire was kinked to measure appropriate catheter length. A Palindrome tunneled hemodialysis catheter measuring 19 cm from tip to cuff was chosen for placement. This was tunneled in a retrograde fashion from the chest wall to the venotomy incision. At the venotomy, serial dilatation was performed and a 15 Fr peel-away sheath was placed over a guidewire. The catheter was then placed through the sheath and the sheath removed. Final catheter positioning was confirmed and documented with a fluoroscopic spot image. The catheter was aspirated, flushed with saline, and injected with appropriate volume heparin dwells. The venotomy incision was closed with subcuticular 4-0 Vicryl. Dermabond was applied to the incision. The catheter exit site was secured with 0-Prolene retention sutures. COMPLICATIONS: None.  No pneumothorax. FINDINGS: After  catheter placement, the tip lies in the right atrium. The catheter aspirates normally and is ready for immediate use. IMPRESSION: Placement of tunneled hemodialysis catheter via the right internal jugular vein. The catheter tip lies in the right atrium. The catheter is ready for immediate use. Electronically Signed   By: Aletta Edouard M.D.   On: 07/27/2020 15:00   VAS [place] UPPER EXT VEIN MAPPING (PRE-OP AVF)  Result Date: 08/06/2020 UPPER EXTREMITY VEIN MAPPING  Indications: Pre-access. Performing Technologist: Griffin Basil RCT RDMS  Examination Guidelines: A complete evaluation includes B-mode imaging, spectral Doppler, color Doppler, and power Doppler as needed of all accessible portions of each vessel. Bilateral testing is considered an integral part of a complete examination. Limited examinations for reoccurring indications may be performed as noted. +-----------------+-------------+----------+--------------+ Right Cephalic   Diameter (cm)Depth (cm)   Findings    +-----------------+-------------+----------+--------------+ Shoulder             0.30        1.70                  +-----------------+-------------+----------+--------------+  Prox upper arm       0.25        1.30                  +-----------------+-------------+----------+--------------+ Mid upper arm        0.21        0.86                  +-----------------+-------------+----------+--------------+ Dist upper arm                          not visualized +-----------------+-------------+----------+--------------+ Antecubital fossa    0.27        0.50                  +-----------------+-------------+----------+--------------+ Prox forearm         0.33        0.51                  +-----------------+-------------+----------+--------------+ Mid forearm          0.33        0.48                  +-----------------+-------------+----------+--------------+ Dist forearm         0.29        0.40                   +-----------------+-------------+----------+--------------+ Wrist                0.20        0.28                  +-----------------+-------------+----------+--------------+ +-----------------+-------------+----------+--------+ Right Basilic    Diameter (cm)Depth (cm)Findings +-----------------+-------------+----------+--------+ Prox upper arm       0.33                        +-----------------+-------------+----------+--------+ Mid upper arm        0.38                        +-----------------+-------------+----------+--------+ Dist upper arm       0.35                        +-----------------+-------------+----------+--------+ Antecubital fossa    0.37                        +-----------------+-------------+----------+--------+ Prox forearm         0.30                        +-----------------+-------------+----------+--------+ Mid forearm          0.23                        +-----------------+-------------+----------+--------+ Distal forearm       0.18                        +-----------------+-------------+----------+--------+ Wrist                0.25                        +-----------------+-------------+----------+--------+ +-----------------+-------------+----------+---------+ Left Cephalic    Diameter (cm)Depth (cm)Findings  +-----------------+-------------+----------+---------+ Shoulder  0.31        1.50             +-----------------+-------------+----------+---------+ Prox upper arm       0.35        0.90             +-----------------+-------------+----------+---------+ Mid upper arm        0.30        0.38             +-----------------+-------------+----------+---------+ Dist upper arm       0.32        0.40             +-----------------+-------------+----------+---------+ Antecubital fossa    0.32        0.34             +-----------------+-------------+----------+---------+ Prox forearm          0.13        0.40   branching +-----------------+-------------+----------+---------+ Mid forearm          0.18        0.32             +-----------------+-------------+----------+---------+ Dist forearm         0.14        0.36             +-----------------+-------------+----------+---------+ Wrist                0.10        0.60             +-----------------+-------------+----------+---------+ +-----------------+-------------+----------+---------+ Left Basilic     Diameter (cm)Depth (cm)Findings  +-----------------+-------------+----------+---------+ Shoulder             0.40                         +-----------------+-------------+----------+---------+ Prox upper arm       0.35                         +-----------------+-------------+----------+---------+ Mid upper arm        0.36                         +-----------------+-------------+----------+---------+ Dist upper arm       0.33                         +-----------------+-------------+----------+---------+ Antecubital fossa    0.33               branching +-----------------+-------------+----------+---------+ Prox forearm         0.19                         +-----------------+-------------+----------+---------+ Mid forearm          0.17                         +-----------------+-------------+----------+---------+ Distal forearm       0.15                         +-----------------+-------------+----------+---------+ Wrist                0.12                         +-----------------+-------------+----------+---------+ *See table(s)  above for measurements and observations.  Diagnosing physician: Harold Barban MD Electronically signed by Harold Barban MD on 08/06/2020 at 5:40:50 PM.    Final     (Echo, Carotid, EGD, Colonoscopy, ERCP)    Subjective: Patient resting in bed awake alert, anxious to go home no new complaints today no nausea vomiting.  No diarrhea.  AV fistula in the left  upper extremity the incision looks clean dry intact with some edema noted encouraged to keep the left upper extremity elevated.  Discharge Exam: Vitals:   08/08/20 0626 08/08/20 0736  BP: (!) 163/60 (!) 134/51  Pulse: 82   Resp:    Temp:  97.8 F (36.6 C)  SpO2:     Vitals:   08/07/20 2349 08/08/20 0407 08/08/20 0626 08/08/20 0736  BP: (!) 152/60 (!) 160/54 (!) 163/60 (!) 134/51  Pulse: 81 84 82   Resp: (!) 25 (!) 23    Temp: 97.8 F (36.6 C) 99.3 F (37.4 C)  97.8 F (36.6 C)  TempSrc: Oral Oral  Oral  SpO2: 93% 92%    Weight:      Height:        General: Pt is alert, awake, not in acute distress Cardiovascular: RRR, S1/S2 +, no rubs, no gallops Respiratory: CTA bilaterally, no wheezing, no rhonchi Abdominal: Soft, NT, ND, bowel sounds + Extremities: 1+ left upper extremity edema    The results of significant diagnostics from this hospitalization (including imaging, microbiology, ancillary and laboratory) are listed below for reference.     Microbiology: Recent Results (from the past 240 hour(s))  Surgical pcr screen     Status: None   Collection Time: 08/06/20  6:53 PM   Specimen: Nasal Mucosa; Nasal Swab  Result Value Ref Range Status   MRSA, PCR NEGATIVE NEGATIVE Final   Staphylococcus aureus NEGATIVE NEGATIVE Final    Comment: (NOTE) The Xpert SA Assay (FDA approved for NASAL specimens in patients 27 years of age and older), is one component of a comprehensive surveillance program. It is not intended to diagnose infection nor to guide or monitor treatment. Performed at [facility], [place] [address], [place], [place] [address]      Labs: BNP (last 3 results) No results for input(s): BNP in the last 8760 hours. Basic Metabolic Panel: Recent Labs  Lab 08/05/20 0657 08/06/20 0059 08/07/20 0011  NA 131* 133* 130*  K 3.6 3.9 3.8  CL 93* 97* 95*  CO2 26 28 25   GLUCOSE 104* 109* 143*  BUN 35* 14 19  CREATININE 5.53* 3.67* 4.65*  CALCIUM 8.8*  8.4*  8.1* 8.8*  PHOS 5.1* 2.9  --    Liver Function Tests: Recent Labs  Lab 08/05/20 0657 08/06/20 0059  ALBUMIN 2.2* 2.4*   No results for input(s): LIPASE, AMYLASE in the last 168 hours. No results for input(s): AMMONIA in the last 168 hours. CBC: Recent Labs  Lab 08/04/20 0133 08/05/20 0035 08/06/20 0059 08/07/20 0011 08/08/20 0029  WBC 9.0 8.3 8.6 8.7 8.2  NEUTROABS 6.6 6.0 5.9 6.3 5.9  HGB 9.3* 8.8* 9.0* 9.5* 9.1*  HCT 28.7* 27.2* 28.5* 28.9* 28.9*  MCV 96.0 95.1 96.3 95.1 96.0  PLT 221 238 236 272 280   Cardiac Enzymes: No results for input(s): CKTOTAL, CKMB, CKMBINDEX, TROPONINI in the last 168 hours. BNP: Invalid input(s): POCBNP CBG: Recent Labs  Lab 08/07/20 1239  GLUCAP 101*   D-Dimer No results for input(s): DDIMER in the last 72 hours. Hgb A1c No results  for input(s): HGBA1C in the last 72 hours. Lipid Profile No results for input(s): CHOL, HDL, LDLCALC, TRIG, CHOLHDL, LDLDIRECT in the last 72 hours. Thyroid function studies No results for input(s): TSH, T4TOTAL, T3FREE, THYROIDAB in the last 72 hours.  Invalid input(s): FREET3 Anemia work up No results for input(s): VITAMINB12, FOLATE, FERRITIN, TIBC, IRON, RETICCTPCT in the last 72 hours. Urinalysis    Component Value Date/Time   COLORURINE YELLOW 07/28/2020 1131   APPEARANCEUR HAZY (A) 07/28/2020 1131   LABSPEC 1.016 07/28/2020 1131   PHURINE 5.0 07/28/2020 1131   GLUCOSEU NEGATIVE 07/28/2020 1131   GLUCOSEU NEGATIVE 04/26/2020 0909   HGBUR NEGATIVE 07/28/2020 1131   BILIRUBINUR NEGATIVE 07/28/2020 1131   KETONESUR NEGATIVE 07/28/2020 1131   PROTEINUR NEGATIVE 07/28/2020 1131   UROBILINOGEN 0.2 04/26/2020 0909   NITRITE NEGATIVE 07/28/2020 1131   LEUKOCYTESUR LARGE (A) 07/28/2020 1131   Sepsis Labs Invalid input(s): PROCALCITONIN,  WBC,  LACTICIDVEN Microbiology Recent Results (from the past 240 hour(s))  Surgical pcr screen     Status: None   Collection Time: 08/06/20  6:53 PM    Specimen: Nasal Mucosa; Nasal Swab  Result Value Ref Range Status   MRSA, PCR NEGATIVE NEGATIVE Final   Staphylococcus aureus NEGATIVE NEGATIVE Final    Comment: (NOTE) The Xpert SA Assay (FDA approved for NASAL specimens in patients 60 years of age and older), is one component of a comprehensive surveillance program. It is not intended to diagnose infection nor to guide or monitor treatment. Performed at [facility], [place] [address] [place], [place] [address]      Time coordinating discharge:  39 minutes  SIGNED:   Georgette Shell, MD  [facility] 08/08/2020, 9:39 AM   If 7PM-7AM, please contact night-coverage [email or web address] Password [email or web address]

## 2020-08-08 NOTE — Progress Notes (Addendum)
  Progress Note    08/08/2020 7:59 AM 1 Day Post-Op  Subjective: no complaints   Vitals:   08/08/20 0626 08/08/20 0736  BP: (!) 163/60 (!) 134/51  Pulse: 82   Resp:    Temp:  97.8 F (36.6 C)  SpO2:     Physical Exam: Cardiac: regular Lungs: non labored Incisions: left ac incision is clean, dry and intact. No hematoma Extremities: mild swelling around left AC and forearm. Left forearm and hand warm. 2+ left radial pulse. 5/5 grip strength Neurologic: alert and oriented  CBC    Component Value Date/Time   WBC 8.2 08/08/2020 0029   RBC 3.01 (L) 08/08/2020 0029   HGB 9.1 (L) 08/08/2020 0029   HCT 28.9 (L) 08/08/2020 0029   PLT 280 08/08/2020 0029   MCV 96.0 08/08/2020 0029   MCH 30.2 08/08/2020 0029   MCHC 31.5 08/08/2020 0029   RDW 14.4 08/08/2020 0029   LYMPHSABS 0.7 08/08/2020 0029   MONOABS 1.5 (H) 08/08/2020 0029   EOSABS 0.1 08/08/2020 0029   BASOSABS 0.0 08/08/2020 0029    BMET    Component Value Date/Time   NA 130 (L) 08/07/2020 0011   NA 137 02/18/2015 0000   K 3.8 08/07/2020 0011   CL 95 (L) 08/07/2020 0011   CO2 25 08/07/2020 0011   GLUCOSE 143 (H) 08/07/2020 0011   BUN 19 08/07/2020 0011   BUN 15 02/18/2015 0000   CREATININE 4.65 (H) 08/07/2020 0011   CALCIUM 8.8 (L) 08/07/2020 0011   CALCIUM 8.1 (L) 08/06/2020 0059   GFRNONAA 8 (L) 08/07/2020 0011   GFRAA 10 (L) 08/07/2020 0011    INR    Component Value Date/Time   INR 1.1 08/07/2020 0011     Intake/Output Summary (Last 24 hours) at 08/08/2020 0759 Last data filed at 08/07/2020 2349 Gross per 24 hour  Intake 480 ml  Output 1510 ml  Net -1030 ml     Assessment/Plan:  78 y.o. female is s/p left brachiocephalic av fistula 1 Day Post-Op. Doing well post op. Good thrill in fistula. Left arm ac incision c/d/i. Mild swelling of left forearm. I have encouraged patient to elevate arm. She is not having any steal symptoms. Palpable left radial pulse. She will have follow up in 4-6 weeks with  duplex to evaluate fistula maturation   Karoline Caldwell, PA-C [facility] [phone] 08/08/2020 7:59 AM

## 2020-08-09 DIAGNOSIS — D631 Anemia in chronic kidney disease: Secondary | ICD-10-CM | POA: Diagnosis not present

## 2020-08-09 DIAGNOSIS — N186 End stage renal disease: Secondary | ICD-10-CM | POA: Diagnosis not present

## 2020-08-09 DIAGNOSIS — D509 Iron deficiency anemia, unspecified: Secondary | ICD-10-CM | POA: Diagnosis not present

## 2020-08-09 DIAGNOSIS — N179 Acute kidney failure, unspecified: Secondary | ICD-10-CM | POA: Diagnosis not present

## 2020-08-09 DIAGNOSIS — E875 Hyperkalemia: Secondary | ICD-10-CM | POA: Diagnosis not present

## 2020-08-09 DIAGNOSIS — Z992 Dependence on renal dialysis: Secondary | ICD-10-CM | POA: Diagnosis not present

## 2020-08-12 DIAGNOSIS — D509 Iron deficiency anemia, unspecified: Secondary | ICD-10-CM | POA: Diagnosis not present

## 2020-08-12 DIAGNOSIS — D631 Anemia in chronic kidney disease: Secondary | ICD-10-CM | POA: Diagnosis not present

## 2020-08-12 DIAGNOSIS — Z992 Dependence on renal dialysis: Secondary | ICD-10-CM | POA: Diagnosis not present

## 2020-08-12 DIAGNOSIS — N179 Acute kidney failure, unspecified: Secondary | ICD-10-CM | POA: Diagnosis not present

## 2020-08-14 DIAGNOSIS — N2581 Secondary hyperparathyroidism of renal origin: Secondary | ICD-10-CM | POA: Diagnosis not present

## 2020-08-14 DIAGNOSIS — Z992 Dependence on renal dialysis: Secondary | ICD-10-CM | POA: Diagnosis not present

## 2020-08-14 DIAGNOSIS — D509 Iron deficiency anemia, unspecified: Secondary | ICD-10-CM | POA: Diagnosis not present

## 2020-08-14 DIAGNOSIS — N179 Acute kidney failure, unspecified: Secondary | ICD-10-CM | POA: Diagnosis not present

## 2020-08-14 DIAGNOSIS — D631 Anemia in chronic kidney disease: Secondary | ICD-10-CM | POA: Diagnosis not present

## 2020-08-16 DIAGNOSIS — N179 Acute kidney failure, unspecified: Secondary | ICD-10-CM | POA: Diagnosis not present

## 2020-08-16 DIAGNOSIS — D631 Anemia in chronic kidney disease: Secondary | ICD-10-CM | POA: Diagnosis not present

## 2020-08-16 DIAGNOSIS — N2581 Secondary hyperparathyroidism of renal origin: Secondary | ICD-10-CM | POA: Diagnosis not present

## 2020-08-16 DIAGNOSIS — Z992 Dependence on renal dialysis: Secondary | ICD-10-CM | POA: Diagnosis not present

## 2020-08-16 DIAGNOSIS — D509 Iron deficiency anemia, unspecified: Secondary | ICD-10-CM | POA: Diagnosis not present

## 2020-08-19 DIAGNOSIS — D631 Anemia in chronic kidney disease: Secondary | ICD-10-CM | POA: Diagnosis not present

## 2020-08-19 DIAGNOSIS — N2581 Secondary hyperparathyroidism of renal origin: Secondary | ICD-10-CM | POA: Diagnosis not present

## 2020-08-19 DIAGNOSIS — Z992 Dependence on renal dialysis: Secondary | ICD-10-CM | POA: Diagnosis not present

## 2020-08-19 DIAGNOSIS — D509 Iron deficiency anemia, unspecified: Secondary | ICD-10-CM | POA: Diagnosis not present

## 2020-08-19 DIAGNOSIS — N179 Acute kidney failure, unspecified: Secondary | ICD-10-CM | POA: Diagnosis not present

## 2020-08-21 DIAGNOSIS — N2581 Secondary hyperparathyroidism of renal origin: Secondary | ICD-10-CM | POA: Diagnosis not present

## 2020-08-21 DIAGNOSIS — D509 Iron deficiency anemia, unspecified: Secondary | ICD-10-CM | POA: Diagnosis not present

## 2020-08-21 DIAGNOSIS — Z992 Dependence on renal dialysis: Secondary | ICD-10-CM | POA: Diagnosis not present

## 2020-08-21 DIAGNOSIS — N179 Acute kidney failure, unspecified: Secondary | ICD-10-CM | POA: Diagnosis not present

## 2020-08-21 DIAGNOSIS — D631 Anemia in chronic kidney disease: Secondary | ICD-10-CM | POA: Diagnosis not present

## 2020-08-23 DIAGNOSIS — D631 Anemia in chronic kidney disease: Secondary | ICD-10-CM | POA: Diagnosis not present

## 2020-08-23 DIAGNOSIS — N2581 Secondary hyperparathyroidism of renal origin: Secondary | ICD-10-CM | POA: Diagnosis not present

## 2020-08-23 DIAGNOSIS — N179 Acute kidney failure, unspecified: Secondary | ICD-10-CM | POA: Diagnosis not present

## 2020-08-23 DIAGNOSIS — Z992 Dependence on renal dialysis: Secondary | ICD-10-CM | POA: Diagnosis not present

## 2020-08-23 DIAGNOSIS — D509 Iron deficiency anemia, unspecified: Secondary | ICD-10-CM | POA: Diagnosis not present

## 2020-08-26 DIAGNOSIS — Z992 Dependence on renal dialysis: Secondary | ICD-10-CM | POA: Diagnosis not present

## 2020-08-26 DIAGNOSIS — D631 Anemia in chronic kidney disease: Secondary | ICD-10-CM | POA: Diagnosis not present

## 2020-08-26 DIAGNOSIS — N2581 Secondary hyperparathyroidism of renal origin: Secondary | ICD-10-CM | POA: Diagnosis not present

## 2020-08-26 DIAGNOSIS — D509 Iron deficiency anemia, unspecified: Secondary | ICD-10-CM | POA: Diagnosis not present

## 2020-08-26 DIAGNOSIS — N179 Acute kidney failure, unspecified: Secondary | ICD-10-CM | POA: Diagnosis not present

## 2020-08-28 DIAGNOSIS — D509 Iron deficiency anemia, unspecified: Secondary | ICD-10-CM | POA: Diagnosis not present

## 2020-08-28 DIAGNOSIS — N2581 Secondary hyperparathyroidism of renal origin: Secondary | ICD-10-CM | POA: Diagnosis not present

## 2020-08-28 DIAGNOSIS — Z992 Dependence on renal dialysis: Secondary | ICD-10-CM | POA: Diagnosis not present

## 2020-08-28 DIAGNOSIS — N179 Acute kidney failure, unspecified: Secondary | ICD-10-CM | POA: Diagnosis not present

## 2020-08-28 DIAGNOSIS — D631 Anemia in chronic kidney disease: Secondary | ICD-10-CM | POA: Diagnosis not present

## 2020-08-30 DIAGNOSIS — D631 Anemia in chronic kidney disease: Secondary | ICD-10-CM | POA: Diagnosis not present

## 2020-08-30 DIAGNOSIS — N2581 Secondary hyperparathyroidism of renal origin: Secondary | ICD-10-CM | POA: Diagnosis not present

## 2020-08-30 DIAGNOSIS — Z992 Dependence on renal dialysis: Secondary | ICD-10-CM | POA: Diagnosis not present

## 2020-08-30 DIAGNOSIS — D509 Iron deficiency anemia, unspecified: Secondary | ICD-10-CM | POA: Diagnosis not present

## 2020-08-30 DIAGNOSIS — N179 Acute kidney failure, unspecified: Secondary | ICD-10-CM | POA: Diagnosis not present

## 2020-09-02 DIAGNOSIS — N179 Acute kidney failure, unspecified: Secondary | ICD-10-CM | POA: Diagnosis not present

## 2020-09-02 DIAGNOSIS — D631 Anemia in chronic kidney disease: Secondary | ICD-10-CM | POA: Diagnosis not present

## 2020-09-02 DIAGNOSIS — Z992 Dependence on renal dialysis: Secondary | ICD-10-CM | POA: Diagnosis not present

## 2020-09-02 DIAGNOSIS — N2581 Secondary hyperparathyroidism of renal origin: Secondary | ICD-10-CM | POA: Diagnosis not present

## 2020-09-02 DIAGNOSIS — D509 Iron deficiency anemia, unspecified: Secondary | ICD-10-CM | POA: Diagnosis not present

## 2020-09-04 ENCOUNTER — Other Ambulatory Visit: Payer: Self-pay

## 2020-09-04 DIAGNOSIS — D631 Anemia in chronic kidney disease: Secondary | ICD-10-CM | POA: Diagnosis not present

## 2020-09-04 DIAGNOSIS — N179 Acute kidney failure, unspecified: Secondary | ICD-10-CM | POA: Diagnosis not present

## 2020-09-04 DIAGNOSIS — Z992 Dependence on renal dialysis: Secondary | ICD-10-CM | POA: Diagnosis not present

## 2020-09-04 DIAGNOSIS — N1832 Chronic kidney disease, stage 3b: Secondary | ICD-10-CM

## 2020-09-04 DIAGNOSIS — N2581 Secondary hyperparathyroidism of renal origin: Secondary | ICD-10-CM | POA: Diagnosis not present

## 2020-09-04 DIAGNOSIS — D509 Iron deficiency anemia, unspecified: Secondary | ICD-10-CM | POA: Diagnosis not present

## 2020-09-06 DIAGNOSIS — D509 Iron deficiency anemia, unspecified: Secondary | ICD-10-CM | POA: Diagnosis not present

## 2020-09-06 DIAGNOSIS — D631 Anemia in chronic kidney disease: Secondary | ICD-10-CM | POA: Diagnosis not present

## 2020-09-06 DIAGNOSIS — N2581 Secondary hyperparathyroidism of renal origin: Secondary | ICD-10-CM | POA: Diagnosis not present

## 2020-09-06 DIAGNOSIS — N179 Acute kidney failure, unspecified: Secondary | ICD-10-CM | POA: Diagnosis not present

## 2020-09-06 DIAGNOSIS — Z992 Dependence on renal dialysis: Secondary | ICD-10-CM | POA: Diagnosis not present

## 2020-09-09 ENCOUNTER — Other Ambulatory Visit: Payer: Self-pay

## 2020-09-09 ENCOUNTER — Inpatient Hospital Stay (HOSPITAL_COMMUNITY)
Admission: EM | Admit: 2020-09-09 | Discharge: 2020-09-12 | DRG: 391 | Disposition: A | Discharge status: Home / Self Care | Payer: Medicare Other | Attending: Internal Medicine | Admitting: Internal Medicine

## 2020-09-09 ENCOUNTER — Encounter (HOSPITAL_COMMUNITY): Payer: Self-pay | Admitting: Emergency Medicine

## 2020-09-09 DIAGNOSIS — J449 Chronic obstructive pulmonary disease, unspecified: Secondary | ICD-10-CM | POA: Diagnosis present

## 2020-09-09 DIAGNOSIS — J9611 Chronic respiratory failure with hypoxia: Secondary | ICD-10-CM | POA: Diagnosis present

## 2020-09-09 DIAGNOSIS — J9811 Atelectasis: Secondary | ICD-10-CM | POA: Diagnosis not present

## 2020-09-09 DIAGNOSIS — A084 Viral intestinal infection, unspecified: Principal | ICD-10-CM | POA: Diagnosis present

## 2020-09-09 DIAGNOSIS — Z85528 Personal history of other malignant neoplasm of kidney: Secondary | ICD-10-CM

## 2020-09-09 DIAGNOSIS — N2581 Secondary hyperparathyroidism of renal origin: Secondary | ICD-10-CM | POA: Diagnosis present

## 2020-09-09 DIAGNOSIS — Z87891 Personal history of nicotine dependence: Secondary | ICD-10-CM

## 2020-09-09 DIAGNOSIS — Z8249 Family history of ischemic heart disease and other diseases of the circulatory system: Secondary | ICD-10-CM

## 2020-09-09 DIAGNOSIS — R112 Nausea with vomiting, unspecified: Secondary | ICD-10-CM | POA: Diagnosis not present

## 2020-09-09 DIAGNOSIS — J90 Pleural effusion, not elsewhere classified: Secondary | ICD-10-CM | POA: Diagnosis present

## 2020-09-09 DIAGNOSIS — R000 Tachycardia, unspecified: Secondary | ICD-10-CM | POA: Diagnosis not present

## 2020-09-09 DIAGNOSIS — D631 Anemia in chronic kidney disease: Secondary | ICD-10-CM | POA: Diagnosis present

## 2020-09-09 DIAGNOSIS — R531 Weakness: Secondary | ICD-10-CM | POA: Diagnosis not present

## 2020-09-09 DIAGNOSIS — Z905 Acquired absence of kidney: Secondary | ICD-10-CM

## 2020-09-09 DIAGNOSIS — Z9981 Dependence on supplemental oxygen: Secondary | ICD-10-CM

## 2020-09-09 DIAGNOSIS — E875 Hyperkalemia: Secondary | ICD-10-CM | POA: Diagnosis present

## 2020-09-09 DIAGNOSIS — Z79899 Other long term (current) drug therapy: Secondary | ICD-10-CM

## 2020-09-09 DIAGNOSIS — N186 End stage renal disease: Secondary | ICD-10-CM | POA: Diagnosis present

## 2020-09-09 DIAGNOSIS — Z8619 Personal history of other infectious and parasitic diseases: Secondary | ICD-10-CM

## 2020-09-09 DIAGNOSIS — N179 Acute kidney failure, unspecified: Secondary | ICD-10-CM | POA: Diagnosis present

## 2020-09-09 DIAGNOSIS — Z20822 Contact with and (suspected) exposure to covid-19: Secondary | ICD-10-CM | POA: Diagnosis not present

## 2020-09-09 DIAGNOSIS — Z9115 Patient's noncompliance with renal dialysis: Secondary | ICD-10-CM

## 2020-09-09 DIAGNOSIS — E785 Hyperlipidemia, unspecified: Secondary | ICD-10-CM | POA: Diagnosis present

## 2020-09-09 DIAGNOSIS — E8770 Fluid overload, unspecified: Secondary | ICD-10-CM | POA: Diagnosis present

## 2020-09-09 DIAGNOSIS — R197 Diarrhea, unspecified: Secondary | ICD-10-CM | POA: Diagnosis not present

## 2020-09-09 DIAGNOSIS — I10 Essential (primary) hypertension: Secondary | ICD-10-CM | POA: Diagnosis present

## 2020-09-09 DIAGNOSIS — I120 Hypertensive chronic kidney disease with stage 5 chronic kidney disease or end stage renal disease: Secondary | ICD-10-CM | POA: Diagnosis present

## 2020-09-09 DIAGNOSIS — I160 Hypertensive urgency: Secondary | ICD-10-CM | POA: Diagnosis present

## 2020-09-09 DIAGNOSIS — Z992 Dependence on renal dialysis: Secondary | ICD-10-CM

## 2020-09-09 LAB — CBC
HCT: 41.4 % (ref 36.0–46.0)
Hemoglobin: 13.2 g/dL (ref 12.0–15.0)
MCH: 30.2 pg (ref 26.0–34.0)
MCHC: 31.9 g/dL (ref 30.0–36.0)
MCV: 94.7 fL (ref 80.0–100.0)
Platelets: 188 10*3/uL (ref 150–400)
RBC: 4.37 MIL/uL (ref 3.87–5.11)
RDW: 14.4 % (ref 11.5–15.5)
WBC: 19.5 10*3/uL — ABNORMAL HIGH (ref 4.0–10.5)
nRBC: 0 % (ref 0.0–0.2)

## 2020-09-09 LAB — BASIC METABOLIC PANEL
Anion gap: 13 (ref 5–15)
BUN: 34 mg/dL — ABNORMAL HIGH (ref 8–23)
CO2: 27 mmol/L (ref 22–32)
Calcium: 9.1 mg/dL (ref 8.9–10.3)
Chloride: 98 mmol/L (ref 98–111)
Creatinine, Ser: 6.61 mg/dL — ABNORMAL HIGH (ref 0.44–1.00)
GFR calc Af Amer: 6 mL/min — ABNORMAL LOW (ref >60)
GFR calc non Af Amer: 6 mL/min — ABNORMAL LOW (ref >60)
Glucose, Bld: 108 mg/dL — ABNORMAL HIGH (ref 70–99)
Potassium: 5.4 mmol/L — ABNORMAL HIGH (ref 3.5–5.1)
Sodium: 138 mmol/L (ref 135–145)

## 2020-09-09 NOTE — ED Triage Notes (Addendum)
Onset 3 days ago developed nausea,emesis, and diarrhea. Denies pain. Has dialysis on M/W/F last dialysis last Friday. Did not feel well to have dialysis to with general weakness. Patient on home oxygen 2L [place]

## 2020-09-10 ENCOUNTER — Encounter (HOSPITAL_COMMUNITY): Payer: Self-pay | Admitting: Emergency Medicine

## 2020-09-10 ENCOUNTER — Emergency Department (HOSPITAL_COMMUNITY): Payer: Medicare Other

## 2020-09-10 DIAGNOSIS — J9811 Atelectasis: Secondary | ICD-10-CM | POA: Diagnosis not present

## 2020-09-10 DIAGNOSIS — Z8249 Family history of ischemic heart disease and other diseases of the circulatory system: Secondary | ICD-10-CM | POA: Diagnosis not present

## 2020-09-10 DIAGNOSIS — E785 Hyperlipidemia, unspecified: Secondary | ICD-10-CM | POA: Diagnosis present

## 2020-09-10 DIAGNOSIS — E8770 Fluid overload, unspecified: Secondary | ICD-10-CM | POA: Diagnosis present

## 2020-09-10 DIAGNOSIS — I160 Hypertensive urgency: Secondary | ICD-10-CM | POA: Diagnosis present

## 2020-09-10 DIAGNOSIS — N179 Acute kidney failure, unspecified: Secondary | ICD-10-CM | POA: Diagnosis present

## 2020-09-10 DIAGNOSIS — N186 End stage renal disease: Secondary | ICD-10-CM | POA: Diagnosis present

## 2020-09-10 DIAGNOSIS — Z20822 Contact with and (suspected) exposure to covid-19: Secondary | ICD-10-CM | POA: Diagnosis present

## 2020-09-10 DIAGNOSIS — R112 Nausea with vomiting, unspecified: Secondary | ICD-10-CM | POA: Diagnosis present

## 2020-09-10 DIAGNOSIS — E7849 Other hyperlipidemia: Secondary | ICD-10-CM | POA: Diagnosis not present

## 2020-09-10 DIAGNOSIS — Z85528 Personal history of other malignant neoplasm of kidney: Secondary | ICD-10-CM | POA: Diagnosis not present

## 2020-09-10 DIAGNOSIS — N2581 Secondary hyperparathyroidism of renal origin: Secondary | ICD-10-CM | POA: Diagnosis present

## 2020-09-10 DIAGNOSIS — I120 Hypertensive chronic kidney disease with stage 5 chronic kidney disease or end stage renal disease: Secondary | ICD-10-CM | POA: Diagnosis present

## 2020-09-10 DIAGNOSIS — D631 Anemia in chronic kidney disease: Secondary | ICD-10-CM | POA: Diagnosis present

## 2020-09-10 DIAGNOSIS — J449 Chronic obstructive pulmonary disease, unspecified: Secondary | ICD-10-CM | POA: Diagnosis present

## 2020-09-10 DIAGNOSIS — N250 Renal osteodystrophy: Secondary | ICD-10-CM | POA: Diagnosis not present

## 2020-09-10 DIAGNOSIS — Z905 Acquired absence of kidney: Secondary | ICD-10-CM | POA: Diagnosis not present

## 2020-09-10 DIAGNOSIS — Z87891 Personal history of nicotine dependence: Secondary | ICD-10-CM | POA: Diagnosis not present

## 2020-09-10 DIAGNOSIS — Z8619 Personal history of other infectious and parasitic diseases: Secondary | ICD-10-CM | POA: Diagnosis not present

## 2020-09-10 DIAGNOSIS — Z79899 Other long term (current) drug therapy: Secondary | ICD-10-CM | POA: Diagnosis not present

## 2020-09-10 DIAGNOSIS — I10 Essential (primary) hypertension: Secondary | ICD-10-CM | POA: Diagnosis not present

## 2020-09-10 DIAGNOSIS — E8779 Other fluid overload: Secondary | ICD-10-CM | POA: Diagnosis not present

## 2020-09-10 DIAGNOSIS — Z9981 Dependence on supplemental oxygen: Secondary | ICD-10-CM | POA: Diagnosis not present

## 2020-09-10 DIAGNOSIS — Z9115 Patient's noncompliance with renal dialysis: Secondary | ICD-10-CM | POA: Diagnosis not present

## 2020-09-10 DIAGNOSIS — A084 Viral intestinal infection, unspecified: Secondary | ICD-10-CM | POA: Diagnosis present

## 2020-09-10 DIAGNOSIS — J9611 Chronic respiratory failure with hypoxia: Secondary | ICD-10-CM | POA: Diagnosis present

## 2020-09-10 DIAGNOSIS — J90 Pleural effusion, not elsewhere classified: Secondary | ICD-10-CM | POA: Diagnosis present

## 2020-09-10 DIAGNOSIS — R531 Weakness: Secondary | ICD-10-CM | POA: Diagnosis not present

## 2020-09-10 DIAGNOSIS — R197 Diarrhea, unspecified: Secondary | ICD-10-CM | POA: Diagnosis not present

## 2020-09-10 DIAGNOSIS — Z992 Dependence on renal dialysis: Secondary | ICD-10-CM | POA: Diagnosis not present

## 2020-09-10 DIAGNOSIS — E875 Hyperkalemia: Secondary | ICD-10-CM | POA: Diagnosis present

## 2020-09-10 LAB — BASIC METABOLIC PANEL
Anion gap: 15 (ref 5–15)
BUN: 49 mg/dL — ABNORMAL HIGH (ref 8–23)
CO2: 22 mmol/L (ref 22–32)
Calcium: 9.1 mg/dL (ref 8.9–10.3)
Chloride: 99 mmol/L (ref 98–111)
Creatinine, Ser: 7.88 mg/dL — ABNORMAL HIGH (ref 0.44–1.00)
GFR calc Af Amer: 5 mL/min — ABNORMAL LOW (ref >60)
GFR calc non Af Amer: 4 mL/min — ABNORMAL LOW (ref >60)
Glucose, Bld: 121 mg/dL — ABNORMAL HIGH (ref 70–99)
Potassium: 5.4 mmol/L — ABNORMAL HIGH (ref 3.5–5.1)
Sodium: 136 mmol/L (ref 135–145)

## 2020-09-10 LAB — MAGNESIUM: Magnesium: 2.1 mg/dL (ref 1.7–2.4)

## 2020-09-10 LAB — MRSA PCR SCREENING: MRSA by PCR: NEGATIVE

## 2020-09-10 LAB — CBC
HCT: 36.6 % (ref 36.0–46.0)
Hemoglobin: 11.7 g/dL — ABNORMAL LOW (ref 12.0–15.0)
MCH: 29.6 pg (ref 26.0–34.0)
MCHC: 32 g/dL (ref 30.0–36.0)
MCV: 92.7 fL (ref 80.0–100.0)
Platelets: 163 10*3/uL (ref 150–400)
RBC: 3.95 MIL/uL (ref 3.87–5.11)
RDW: 14.6 % (ref 11.5–15.5)
WBC: 18.9 10*3/uL — ABNORMAL HIGH (ref 4.0–10.5)
nRBC: 0 % (ref 0.0–0.2)

## 2020-09-10 LAB — DIFFERENTIAL
Abs Immature Granulocytes: 0.1 10*3/uL — ABNORMAL HIGH (ref 0.00–0.07)
Basophils Absolute: 0.1 10*3/uL (ref 0.0–0.1)
Basophils Relative: 0 %
Eosinophils Absolute: 0 10*3/uL (ref 0.0–0.5)
Eosinophils Relative: 0 %
Immature Granulocytes: 1 %
Lymphocytes Relative: 6 %
Lymphs Abs: 1.1 10*3/uL (ref 0.7–4.0)
Monocytes Absolute: 1.8 10*3/uL — ABNORMAL HIGH (ref 0.1–1.0)
Monocytes Relative: 10 %
Neutro Abs: 15.8 10*3/uL — ABNORMAL HIGH (ref 1.7–7.7)
Neutrophils Relative %: 83 %

## 2020-09-10 LAB — RESPIRATORY PANEL BY RT PCR (FLU A&B, COVID)
Influenza A by PCR: NEGATIVE
Influenza B by PCR: NEGATIVE
SARS Coronavirus 2 by RT PCR: NEGATIVE

## 2020-09-10 LAB — CREATININE, SERUM
Creatinine, Ser: 8.31 mg/dL — ABNORMAL HIGH (ref 0.44–1.00)
GFR calc Af Amer: 5 mL/min — ABNORMAL LOW (ref >60)
GFR calc non Af Amer: 4 mL/min — ABNORMAL LOW (ref >60)

## 2020-09-10 LAB — LACTIC ACID, PLASMA: Lactic Acid, Venous: 1.2 mmol/L (ref 0.5–1.9)

## 2020-09-10 LAB — LIPASE, BLOOD: Lipase: 44 U/L (ref 11–51)

## 2020-09-10 MED ORDER — SODIUM CHLORIDE 0.9 % IV BOLUS
500.0000 mL | Freq: Once | INTRAVENOUS | Status: AC
  Administered 2020-09-10: 500 mL via INTRAVENOUS

## 2020-09-10 MED ORDER — AMLODIPINE BESYLATE 10 MG PO TABS
10.0000 mg | ORAL_TABLET | Freq: Every day | ORAL | Status: DC
  Administered 2020-09-10 – 2020-09-12 (×3): 10 mg via ORAL
  Filled 2020-09-10: qty 2
  Filled 2020-09-10 (×2): qty 1

## 2020-09-10 MED ORDER — ONDANSETRON HCL 4 MG/2ML IJ SOLN
4.0000 mg | Freq: Once | INTRAMUSCULAR | Status: AC
  Administered 2020-09-10: 4 mg via INTRAVENOUS
  Filled 2020-09-10: qty 2

## 2020-09-10 MED ORDER — HEPARIN SODIUM (PORCINE) 5000 UNIT/ML IJ SOLN
5000.0000 [IU] | Freq: Three times a day (TID) | INTRAMUSCULAR | Status: DC
  Administered 2020-09-10 – 2020-09-12 (×4): 5000 [IU] via SUBCUTANEOUS
  Filled 2020-09-10 (×4): qty 1

## 2020-09-10 MED ORDER — ACETAMINOPHEN 325 MG PO TABS: 650.0000 mg | ORAL_TABLET | Freq: Four times a day (QID) | ORAL | Status: DC | PRN

## 2020-09-10 MED ORDER — METOPROLOL TARTRATE 5 MG/5ML IV SOLN
5.0000 mg | Freq: Once | INTRAVENOUS | Status: AC
  Administered 2020-09-10: 5 mg via INTRAVENOUS
  Filled 2020-09-10: qty 5

## 2020-09-10 MED ORDER — CHLORHEXIDINE GLUCONATE CLOTH 2 % EX PADS
6.0000 | MEDICATED_PAD | Freq: Every day | CUTANEOUS | Status: DC
  Administered 2020-09-12: 6 via TOPICAL

## 2020-09-10 MED ORDER — CARVEDILOL 12.5 MG PO TABS
12.5000 mg | ORAL_TABLET | Freq: Two times a day (BID) | ORAL | Status: DC
  Administered 2020-09-10 – 2020-09-12 (×2): 12.5 mg via ORAL
  Filled 2020-09-10: qty 4
  Filled 2020-09-10: qty 1

## 2020-09-10 MED ORDER — DOXERCALCIFEROL 4 MCG/2ML IV SOLN
1.0000 ug | INTRAVENOUS | Status: DC
  Administered 2020-09-11: 1 ug via INTRAVENOUS

## 2020-09-10 MED ORDER — HYDRALAZINE HCL 20 MG/ML IJ SOLN
10.0000 mg | Freq: Four times a day (QID) | INTRAMUSCULAR | Status: DC | PRN
  Administered 2020-09-12: 10 mg via INTRAVENOUS
  Filled 2020-09-10 (×2): qty 1

## 2020-09-10 MED ORDER — ACETAMINOPHEN 650 MG RE SUPP: 650.0000 mg | Freq: Four times a day (QID) | RECTAL | Status: DC | PRN

## 2020-09-10 MED ORDER — FUROSEMIDE 10 MG/ML IJ SOLN
80.0000 mg | Freq: Once | INTRAMUSCULAR | Status: AC
  Administered 2020-09-10: 80 mg via INTRAVENOUS
  Filled 2020-09-10: qty 8

## 2020-09-10 MED ORDER — ROSUVASTATIN CALCIUM 5 MG PO TABS
10.0000 mg | ORAL_TABLET | Freq: Every day | ORAL | Status: DC
  Administered 2020-09-10 – 2020-09-12 (×3): 10 mg via ORAL
  Filled 2020-09-10 (×3): qty 2

## 2020-09-10 MED ORDER — CHLORHEXIDINE GLUCONATE CLOTH 2 % EX PADS
6.0000 | MEDICATED_PAD | Freq: Every day | CUTANEOUS | Status: DC
  Administered 2020-09-11 – 2020-09-12 (×2): 6 via TOPICAL

## 2020-09-10 MED ORDER — ONDANSETRON HCL 4 MG PO TABS: 4.0000 mg | ORAL_TABLET | Freq: Four times a day (QID) | ORAL | Status: DC | PRN

## 2020-09-10 MED ORDER — SODIUM ZIRCONIUM CYCLOSILICATE 10 G PO PACK
10.0000 g | PACK | Freq: Once | ORAL | Status: AC
  Administered 2020-09-10: 10 g via ORAL
  Filled 2020-09-10: qty 1

## 2020-09-10 MED ORDER — ONDANSETRON HCL 4 MG/2ML IJ SOLN: 4.0000 mg | Freq: Four times a day (QID) | INTRAMUSCULAR | Status: DC | PRN

## 2020-09-10 NOTE — Plan of Care (Signed)
  Problem: Education: Goal: Knowledge of General Education information will improve Description Including pain rating scale, medication(s)/side effects and non-pharmacologic comfort measures Outcome: Progressing   

## 2020-09-10 NOTE — H&P (Signed)
History and Physical    [name]:[id number] DOB: 10-Sep-1941 DOA: 09/09/2020  PCP: Biagio Borg, MD  Patient coming from: Home  I have personally briefly reviewed patient's old medical records in [place]  Chief Complaint: Vomiting, diarrhea, generalized weakness, decreased appetite since 4 to 5 days  HPI: Cassandra Dyer is a 79 y.o. female with medical history significant of renal cell carcinoma status post right nephrectomy in 2018, ESRD on hemodialysis (MWF) hypertension, hyperlipidemia, hepatitis C, COPD, chronic hypoxemic respiratory failure-on 2 L of oxygen via nasal cannulae at home presents to emergency department with vomiting, diarrhea, generalized weakness and decreased appetite since 5 days.  Patient reports that her symptoms started on Friday, she vomited once on Friday which was nonbloody.  She has generalized weakness and decreased appetite.  Reports loose stool, couple of times, nonbloody, green in color, foul-smelling, denies association with abdominal pain, fever, chills.  Has chronic bilateral lower extremity swelling and exertional dyspnea however denies worsening shortness of breath, chest pain, palpitation, headache, blurry vision, cough, congestion, urinary symptoms.  She missed dialysis appointment yesterday as she was feeling weak.  She is compliant with her home medication however she did not take her medicine this morning.  She lives with her boyfriend at home.  No history of smoking, alcohol, illicit drug use.  ED Course: Upon arrival to ED: Patient's blood pressure noted to be elevated 200/88, tachycardia, tachypnea, on 2 L of oxygen via nasal cannula which is her baseline.  CBC shows leukocytosis from yesterday.  BMP shows potassium of 5.4, creatinine: 7.88, GFR: 5, COVID-19 negative, lactic acid: WNL, blood culture, GI panel: Pending.  Patient received 500 cc of IV fluid, Lopressor and Zofran in ED.  EDP consulted nephrology.  [facility] hospitalist  consulted for admission.  Review of Systems: As per HPI otherwise negative.    Past Medical History:  Diagnosis Date  . Anemia   . Hepatitis    hx of hep C - 10-20 years ago   . History of blood transfusion   . History of kidney cancer   . Hyperlipidemia   . Hypertension    not taken b/p med in 2-3 years md deceased never went to another md     Past Surgical History:  Procedure Laterality Date  . AV FISTULA PLACEMENT Left 08/07/2020   Procedure: LEFT ARM ARTERIOVENOUS (AV) FISTULA CREATION;  Surgeon: [name], MD;  Location: [facility];  Service: Vascular;  Laterality: Left;  . CATARACT EXTRACTION Bilateral    11/30 left /12/9 right  . IR FLUORO GUIDE CV LINE RIGHT  07/27/2020  . IR US GUIDE VASC ACCESS RIGHT  07/27/2020  . ROBOT ASSISTED LAPAROSCOPIC NEPHRECTOMY Right 01/13/2017   Procedure: XI ROBOTIC ASSISTED LAPAROSCOPIC RADICAL NEPHRECTOMY WITH LYSIS OF ADHESION;  Surgeon: Alexis Frock, MD;  Location: [facility];  Service: Urology;  Laterality: Right;     reports that she quit smoking about 19 years ago. Her smoking use included cigarettes. She has a 15.00 pack-year smoking history. She has never used smokeless tobacco. She reports current alcohol use. She reports that she does not use drugs.  No Known Allergies  Family History  Problem Relation Age of Onset  . Hypertension Mother   . Pancreatic cancer Father   . Breast cancer Sister     Prior to Admission medications   Medication Sig Start Date End Date Taking? Authorizing Provider  amLODipine (NORVASC) 10 MG tablet Take 1 tablet (10 mg total) by mouth  daily. 04/19/20  Yes Biagio Borg, MD  Ascorbic Acid (VITAMIN C PO) Take 500 mg by mouth in the morning and at bedtime.    Yes [provider]  benzonatate (TESSALON) 200 MG capsule Take 1 capsule (200 mg total) by mouth 3 (three) times daily as needed. Patient taking differently: Take 200 mg by mouth 3 (three) times daily as needed for cough.  07/22/20  Yes Marrian Salvage, FNP  Black Currant Seed Oil 500 MG CAPS Take 500 mg by mouth daily.   Yes [provider]  Calcium Carbonate-Vit D-Min (CALCIUM 1200 PO) Take 1,200 mg by mouth daily.    Yes [provider]  carvedilol (COREG) 12.5 MG tablet Take 1 tablet (12.5 mg total) by mouth 2 (two) times daily with a meal. 08/08/20  Yes Georgette Shell, MD  Cholecalciferol (VITAMIN D PO) Take 2,000 mcg by mouth See admin instructions. Takes 2000 mcg 3 times a week ( monday, wednesday and friday)   Yes [provider]  Garlic 4268 MG CAPS Take 2,000 mg by mouth daily.    Yes [provider]  Multiple Vitamin (MULTIVITAMIN WITH MINERALS) TABS tablet Take 1 tablet by mouth daily.   Yes [provider]  Omega-3 Fatty Acids (FISH OIL PO) Take 1 tablet by mouth daily.   Yes [provider]  ondansetron (ZOFRAN) 4 MG tablet Take 1 tablet (4 mg total) by mouth every 8 (eight) hours as needed for nausea or vomiting. 04/19/20  Yes Biagio Borg, MD  OVER THE COUNTER MEDICATION Take 1 capsule by mouth daily. Mega juice   Yes [provider]  polyethylene glycol (MIRALAX / GLYCOLAX) 17 g packet Take 17 g by mouth 2 (two) times daily. Patient taking differently: Take 17 g by mouth daily as needed for mild constipation.  08/08/20  Yes Georgette Shell, MD  rosuvastatin (CRESTOR) 10 MG tablet Take 1 tablet by mouth once daily Patient taking differently: Take 10 mg by mouth daily.  07/22/20  Yes Biagio Borg, MD  senna-docusate (SENOKOT-S) 8.6-50 MG tablet Take 2 tablets by mouth 2 (two) times daily. Patient taking differently: Take 1 tablet by mouth daily as needed for mild constipation.  08/08/20  Yes Georgette Shell, MD  hydrALAZINE (APRESOLINE) 100 MG tablet Take 1 tablet (100 mg total) by mouth every 8 (eight) hours. Patient not taking: Reported on 09/10/2020 08/08/20   Georgette Shell, MD    Physical Exam: Vitals:   09/10/20 0440 09/10/20 0532  09/10/20 0842 09/10/20 0927  BP: (!) 160/85 (!) 199/90 (!) 213/96 (!) 194/92  Pulse: 78 (!) 117 (!) 119 (!) 119  Resp: 17 16 16  (!) 24  Temp:    98.3 F (36.8 C)  TempSrc:    Oral  SpO2: 98% 100% 100% 100%  Weight:      Height:        Constitutional: NAD, calm, comfortable, on 2 L of oxygen via nasal cannula, sitting comfortably on the bed.  Communicating well Eyes: PERRL, lids and conjunctivae normal ENMT: Mucous membranes are moist. Posterior pharynx clear of any exudate or lesions.Normal dentition.  Neck: normal, supple, no masses, no thyromegaly Respiratory: Decreased breath sound noted on the bases.  No wheezing, no rhonchi. Cardiovascular: Regular rate and rhythm, no murmurs / rubs / gallops.  Bilateral 3+ pitting edema positive. 2+ pedal pulses. No carotid bruits.  Abdomen: no tenderness, no masses palpated. No hepatosplenomegaly. Bowel sounds positive.  Musculoskeletal: no clubbing /  cyanosis. No joint deformity upper and lower extremities. Good ROM, no contractures. Normal muscle tone.  Skin: no rashes, lesions, ulcers. No induration Neurologic: CN 2-12 grossly intact. Sensation intact, DTR normal. Strength 5/5 in all 4.  Psychiatric: Normal judgment and insight. Alert and oriented x 3. Normal mood.    Labs on Admission: I have personally reviewed following labs and imaging studies  CBC: Recent Labs  Lab 09/09/20 1442  WBC 19.5*  HGB 13.2  HCT 41.4  MCV 94.7  PLT 245   Basic Metabolic Panel: Recent Labs  Lab 09/09/20 1442 09/10/20 0753  NA 138 136  K 5.4* 5.4*  CL 98 99  CO2 27 22  GLUCOSE 108* 121*  BUN 34* 49*  CREATININE 6.61* 7.88*  CALCIUM 9.1 9.1   GFR: Estimated Creatinine Clearance: 5.2 mL/min (A) (by C-G formula based on SCr of 7.88 mg/dL (H)). Liver Function Tests: No results for input(s): AST, ALT, ALKPHOS, BILITOT, PROT, ALBUMIN in the last 168 hours. No results for input(s): LIPASE, AMYLASE in the last 168 hours. No results for input(s):  AMMONIA in the last 168 hours. Coagulation Profile: No results for input(s): INR, PROTIME in the last 168 hours. Cardiac Enzymes: No results for input(s): CKTOTAL, CKMB, CKMBINDEX, TROPONINI in the last 168 hours. BNP (last 3 results) No results for input(s): PROBNP in the last 8760 hours. HbA1C: No results for input(s): HGBA1C in the last 72 hours. CBG: No results for input(s): GLUCAP in the last 168 hours. Lipid Profile: No results for input(s): CHOL, HDL, LDLCALC, TRIG, CHOLHDL, LDLDIRECT in the last 72 hours. Thyroid Function Tests: No results for input(s): TSH, T4TOTAL, FREET4, T3FREE, THYROIDAB in the last 72 hours. Anemia Panel: No results for input(s): VITAMINB12, FOLATE, FERRITIN, TIBC, IRON, RETICCTPCT in the last 72 hours. Urine analysis:    Component Value Date/Time   COLORURINE YELLOW 07/28/2020 1131   APPEARANCEUR HAZY (A) 07/28/2020 1131   LABSPEC 1.016 07/28/2020 1131   PHURINE 5.0 07/28/2020 1131   GLUCOSEU NEGATIVE 07/28/2020 [place] 04/26/2020 0909   HGBUR NEGATIVE 07/28/2020 1131   BILIRUBINUR NEGATIVE 07/28/2020 1131   KETONESUR NEGATIVE 07/28/2020 1131   PROTEINUR NEGATIVE 07/28/2020 1131   UROBILINOGEN 0.2 04/26/2020 0909   NITRITE NEGATIVE 07/28/2020 1131   LEUKOCYTESUR LARGE (A) 07/28/2020 1131    Radiological Exams on Admission: DG Chest Portable 1 View  Result Date: 09/10/2020 CLINICAL DATA:  Weakness. EXAM: PORTABLE CHEST 1 VIEW COMPARISON:  August 04, 2020. FINDINGS: Stable cardiomediastinal silhouette. Right internal jugular dialysis catheter is unchanged in position. No pneumothorax is noted. Mild to moderate bilateral pleural effusions are noted with associated atelectasis. Bony thorax is unremarkable. IMPRESSION: Mild to moderate bilateral pleural effusions with associated atelectasis. Aortic Atherosclerosis (ICD10-I70.0). Electronically Signed   By: Marijo Conception M.D.   On: 09/10/2020 09:30    EKG: Independently reviewed.   Sinus tachycardia.  No ST elevation or depression noted. Assessment/Plan Active Problems:   Essential hypertension   Hypertensive urgency   Hyperkalemia   Hyperlipidemia   ESRD (end stage renal disease) (HCC)   Volume overload   Nausea vomiting and diarrhea   Vomiting and diarrhea: -Patient denies abdominal pain, urinary symptoms or fever.  Lactic acid: WNL, COVID-19 negative.  GI panel is pending.  Patient received 500 cc of IV fluid and Zofran in ED. -Admit patient on the floor. -Zofran as needed for nausea and vomiting.  We will keep her n.p.o.  Check lipase.  Check electrolytes.  ESRD on  hemodialysis: (MWF)/volume overload -Missed hemodialysis appointment yesterday as she was feeling weak.  EDP consulted nephrology for dialysis/volume overload management and.  Hypertensive urgency: Patient blood pressure elevated upon arrival: Likely secondary to missed dialysis appointment.  She did not take her blood pressure medicine this morning.  Received Lopressor once in ED. -Continue home meds-Coreg and amlodipine.  Hydralazine as needed for blood pressure more than 160/100.  Hyperkalemia: Potassium 5.4.  Likely in the setting of missed dialysis appointment. -Check magnesium level.  No EKG changes.  History of COPD-chronic hypoxemic respiratory failure: On 2 L of oxygen via nasal cannulae at home.  She is afebrile with no leukocytosis.   -No wheezing noted on exam.  On continuous pulse ox.  Leukocytosis: WBC 19.5 from yesterday. -Could be reactive.  Chest x-ray negative for pneumonia.  COVID-19 negative.  Repeat CBC, check UA.  DVT prophylaxis: Heparin/SCD Code Status: Full code-confirmed with the patient Family Communication: None present at bedside.  Plan of care discussed with patient in length and she verbalized understanding and agreed with it. Disposition Plan: Home in 1 to 2 days Consults called: Nephrology by EDP Admission status: Inpatient   Mckinley Jewel MD [facility]  If 7PM-7AM, please contact night-coverage [email or web address] Password [email or web address]  09/10/2020, 1:10 PM

## 2020-09-10 NOTE — Consult Note (Signed)
[facility] Renal Consultation Note    Indication for Consultation:  Management of ESRD/hemodialysis; anemia, hypertension/volume and secondary hyperparathyroidism  STM:HDQQ, Hunt Oris, MD  Chief Complaint: Nausea/Vomiting/Diarrhea x 3 days, Weakness  HPI: Cassandra Dyer is a 79 y.o. female with recent history of anuric AKI likely progressed to ESRD, started on dialysis 07/27/2020 during [facility] admission from 8/13-8/26 for AKI thought to be due to ATN.  Unable to complete biopsy due to solitary kidney. Per outpatient records doubtful she will have recovery.  They have checked a 24 hour urine but results were not completed due issue with incorrect specimen collection.  Currently on HD MWF at [facility].    PMHx significant for renal cell carcinoma s/p R nephrectomy (2018), HTN, HLD, remote HCV, COPD, and chronic hypoxemic respiratory failure on home O2 (2L via [place]) who presents to the ED with a 3-day history of nausea, vomiting, and diarrhea. She states the vomiting started on Friday night when she had one episode of NBNB emesis after consuming homemade soup. She subsequently had diarrhea for the following three days. She reports minimal PO intake, including a glass of orange juice yesterday. She also reports feeling very weak with associated exertional dyspnea since this illness started--so much so that she missed her scheduled dialysis session yesterday. She does have bilateral lower extremity edema which she says has slightly worsened. She denies SOB on her baseline O2, orthopnea, cough, wheezing, sputum-production.  She is compliant with her dialysis regimen, medications, and follows fluid-restriction and renal diet at home. However, she has not taken her blood pressure medications this morning. She has been leaving under her dry weight for the last 3 treatments.    In the ED she was found to be hypertensive at 200/88, tachycardic at 104bpm, with sats of 95-97% on 2L [place]. BMP shows Na 136, K  5.4, Cl 99, CO2 22, BUN 49, Cr 7.88, Glu 121. CBC shows WBC 19.5, Hgb 13.2, Hct 14.1, Plt 188. She received 598mL of NS and Metoprolol. CXR showed stable bilateral effusions with associated atelectasis.  She has been admitted for further evaluation and management.   Past Medical History:  Diagnosis Date  . Anemia   . Hepatitis    hx of hep C - 10-20 years ago   . History of blood transfusion   . History of kidney cancer   . Hyperlipidemia   . Hypertension    not taken b/p med in 2-3 years md deceased never went to another md    Past Surgical History:  Procedure Laterality Date  . AV FISTULA PLACEMENT Left 08/07/2020   Procedure: LEFT ARM ARTERIOVENOUS (AV) FISTULA CREATION;  Surgeon: [name], MD;  Location: [facility];  Service: Vascular;  Laterality: Left;  . CATARACT EXTRACTION Bilateral    11/30 left /12/9 right  . IR FLUORO GUIDE CV LINE RIGHT  07/27/2020  . IR US GUIDE VASC ACCESS RIGHT  07/27/2020  . ROBOT ASSISTED LAPAROSCOPIC NEPHRECTOMY Right 01/13/2017   Procedure: XI ROBOTIC ASSISTED LAPAROSCOPIC RADICAL NEPHRECTOMY WITH LYSIS OF ADHESION;  Surgeon: Alexis Frock, MD;  Location: [facility];  Service: Urology;  Laterality: Right;   Family History  Problem Relation Age of Onset  . Hypertension Mother   . Pancreatic cancer Father   . Breast cancer Sister    Social History:  reports that she quit smoking about 19 years ago. Her smoking use included cigarettes. She has a 15.00 pack-year smoking history. She has never used smokeless tobacco. She reports  current alcohol use. She reports that she does not use drugs. No Known Allergies Prior to Admission medications   Medication Sig Start Date End Date Taking? Authorizing Provider  amLODipine (NORVASC) 10 MG tablet Take 1 tablet (10 mg total) by mouth daily. 04/19/20  Yes Biagio Borg, MD  Ascorbic Acid (VITAMIN C PO) Take 500 mg by mouth in the morning and at bedtime.    Yes [provider]  benzonatate (TESSALON) 200 MG  capsule Take 1 capsule (200 mg total) by mouth 3 (three) times daily as needed. Patient taking differently: Take 200 mg by mouth 3 (three) times daily as needed for cough.  07/22/20  Yes Marrian Salvage, FNP  Black Currant Seed Oil 500 MG CAPS Take 500 mg by mouth daily.   Yes [provider]  Calcium Carbonate-Vit D-Min (CALCIUM 1200 PO) Take 1,200 mg by mouth daily.    Yes [provider]  carvedilol (COREG) 12.5 MG tablet Take 1 tablet (12.5 mg total) by mouth 2 (two) times daily with a meal. 08/08/20  Yes Georgette Shell, MD  Cholecalciferol (VITAMIN D PO) Take 2,000 mcg by mouth See admin instructions. Takes 2000 mcg 3 times a week ( monday, wednesday and friday)   Yes [provider]  Garlic 9562 MG CAPS Take 2,000 mg by mouth daily.    Yes [provider]  Multiple Vitamin (MULTIVITAMIN WITH MINERALS) TABS tablet Take 1 tablet by mouth daily.   Yes [provider]  Omega-3 Fatty Acids (FISH OIL PO) Take 1 tablet by mouth daily.   Yes [provider]  ondansetron (ZOFRAN) 4 MG tablet Take 1 tablet (4 mg total) by mouth every 8 (eight) hours as needed for nausea or vomiting. 04/19/20  Yes Biagio Borg, MD  OVER THE COUNTER MEDICATION Take 1 capsule by mouth daily. Mega juice   Yes [provider]  polyethylene glycol (MIRALAX / GLYCOLAX) 17 g packet Take 17 g by mouth 2 (two) times daily. Patient taking differently: Take 17 g by mouth daily as needed for mild constipation.  08/08/20  Yes Georgette Shell, MD  rosuvastatin (CRESTOR) 10 MG tablet Take 1 tablet by mouth once daily Patient taking differently: Take 10 mg by mouth daily.  07/22/20  Yes Biagio Borg, MD  senna-docusate (SENOKOT-S) 8.6-50 MG tablet Take 2 tablets by mouth 2 (two) times daily. Patient taking differently: Take 1 tablet by mouth daily as needed for mild constipation.  08/08/20  Yes Georgette Shell, MD  hydrALAZINE (APRESOLINE) 100 MG tablet Take  1 tablet (100 mg total) by mouth every 8 (eight) hours. Patient not taking: Reported on 09/10/2020 08/08/20   Georgette Shell, MD   Current Facility-Administered Medications  Medication Dose Route Frequency Provider Last Rate Last Admin  . acetaminophen (TYLENOL) tablet 650 mg  650 mg Oral Q6H PRN Pahwani, Rinka R, MD       Or  . acetaminophen (TYLENOL) suppository 650 mg  650 mg Rectal Q6H PRN Pahwani, Rinka R, MD      . amLODipine (NORVASC) tablet 10 mg  10 mg Oral Daily Pahwani, Rinka R, MD      . carvedilol (COREG) tablet 12.5 mg  12.5 mg Oral BID WC Pahwani, Rinka R, MD      . heparin injection 5,000 Units  5,000 Units Subcutaneous Q8H Pahwani, Rinka R, MD      . hydrALAZINE (APRESOLINE) injection 10 mg  10 mg Intravenous Q6H PRN Pahwani, Rinka  R, MD      . ondansetron (ZOFRAN) tablet 4 mg  4 mg Oral Q6H PRN Pahwani, Rinka R, MD       Or  . ondansetron (ZOFRAN) injection 4 mg  4 mg Intravenous Q6H PRN Pahwani, Rinka R, MD      . rosuvastatin (CRESTOR) tablet 10 mg  10 mg Oral Daily Pahwani, Rinka R, MD       Current Outpatient Medications  Medication Sig Dispense Refill  . amLODipine (NORVASC) 10 MG tablet Take 1 tablet (10 mg total) by mouth daily. 90 tablet 3  . Ascorbic Acid (VITAMIN C PO) Take 500 mg by mouth in the morning and at bedtime.     . benzonatate (TESSALON) 200 MG capsule Take 1 capsule (200 mg total) by mouth 3 (three) times daily as needed. (Patient taking differently: Take 200 mg by mouth 3 (three) times daily as needed for cough. ) 30 capsule 0  . Black Currant Seed Oil 500 MG CAPS Take 500 mg by mouth daily.    . Calcium Carbonate-Vit D-Min (CALCIUM 1200 PO) Take 1,200 mg by mouth daily.     . carvedilol (COREG) 12.5 MG tablet Take 1 tablet (12.5 mg total) by mouth 2 (two) times daily with a meal. 60 tablet 1  . Cholecalciferol (VITAMIN D PO) Take 2,000 mcg by mouth See admin instructions. Takes 2000 mcg 3 times a week ( monday, wednesday and friday)    . Garlic  1324 MG CAPS Take 2,000 mg by mouth daily.     . Multiple Vitamin (MULTIVITAMIN WITH MINERALS) TABS tablet Take 1 tablet by mouth daily.    . Omega-3 Fatty Acids (FISH OIL PO) Take 1 tablet by mouth daily.    . ondansetron (ZOFRAN) 4 MG tablet Take 1 tablet (4 mg total) by mouth every 8 (eight) hours as needed for nausea or vomiting. 30 tablet 1  . OVER THE COUNTER MEDICATION Take 1 capsule by mouth daily. Mega juice    . polyethylene glycol (MIRALAX / GLYCOLAX) 17 g packet Take 17 g by mouth 2 (two) times daily. (Patient taking differently: Take 17 g by mouth daily as needed for mild constipation. ) 14 each 0  . rosuvastatin (CRESTOR) 10 MG tablet Take 1 tablet by mouth once daily (Patient taking differently: Take 10 mg by mouth daily. ) 90 tablet 0  . senna-docusate (SENOKOT-S) 8.6-50 MG tablet Take 2 tablets by mouth 2 (two) times daily. (Patient taking differently: Take 1 tablet by mouth daily as needed for mild constipation. )    . hydrALAZINE (APRESOLINE) 100 MG tablet Take 1 tablet (100 mg total) by mouth every 8 (eight) hours. (Patient not taking: Reported on 09/10/2020) 90 tablet 4   Labs: Basic Metabolic Panel: Recent Labs  Lab 09/09/20 1442 09/10/20 0753  NA 138 136  K 5.4* 5.4*  CL 98 99  CO2 27 22  GLUCOSE 108* 121*  BUN 34* 49*  CREATININE 6.61* 7.88*  CALCIUM 9.1 9.1   CBC: Recent Labs  Lab 09/09/20 1442  WBC 19.5*  HGB 13.2  HCT 41.4  MCV 94.7  PLT 188   Studies/Results: DG Chest Portable 1 View  Result Date: 09/10/2020 CLINICAL DATA:  Weakness. EXAM: PORTABLE CHEST 1 VIEW COMPARISON:  August 04, 2020. FINDINGS: Stable cardiomediastinal silhouette. Right internal jugular dialysis catheter is unchanged in position. No pneumothorax is noted. Mild to moderate bilateral pleural effusions are noted with associated atelectasis. Bony thorax is unremarkable. IMPRESSION: Mild to moderate bilateral  pleural effusions with associated atelectasis. Aortic Atherosclerosis  (ICD10-I70.0). Electronically Signed   By: Marijo Conception M.D.   On: 09/10/2020 09:30     Review of Systems  Constitutional: Positive for appetite change and fatigue. Negative for chills, diaphoresis and fever.  HENT: Negative for congestion and facial swelling.   Eyes: Negative for visual disturbance.  Respiratory: Positive for cough and shortness of breath. Negative for chest tightness and wheezing.   Cardiovascular: Positive for palpitations and leg swelling. Negative for chest pain.  Gastrointestinal: Positive for diarrhea, nausea and vomiting. Negative for abdominal distention and blood in stool.  Genitourinary: Negative for dysuria, flank pain, frequency and hematuria.  Neurological: Positive for weakness. Negative for headaches.  All other systems reviewed and are negative.  Physical Exam: Vitals:   09/10/20 0927 09/10/20 1300 09/10/20 1330 09/10/20 1500  BP: (!) 194/92 (!) 194/86 (!) 173/120 (!) 185/94  Pulse: (!) 119 (!) 104  (!) 103  Resp: (!) 24 (!) 24  14  Temp: 98.3 F (36.8 C)     TempSrc: Oral     SpO2: 100% 98%  100%  Weight:      Height:       Physical Exam Constitutional:      Appearance: Normal appearance.     Comments: Elderly African American female laying comfortably in bed in no acute distress.  HENT:     Head: Normocephalic and atraumatic.  Eyes:     Extraocular Movements: Extraocular movements intact.     Conjunctiva/sclera: Conjunctivae normal.  Cardiovascular:     Rate and Rhythm: Regular rhythm. Tachycardia present.     Pulses: Normal pulses.     Heart sounds: No murmur heard.  No friction rub. No gallop.   Pulmonary:     Breath sounds: No stridor. No wheezing, rhonchi or rales.     Comments: Positive whispered egophony. Decreased lung sounds at the bases bilaterally. Chest:     Chest wall: No tenderness.  Abdominal:     General: Abdomen is flat. Bowel sounds are normal. There is no distension.     Palpations: Abdomen is soft.      Tenderness: There is no abdominal tenderness.  Musculoskeletal:     Cervical back: Normal range of motion and neck supple.     Right lower leg: Edema present.     Left lower leg: Edema present.     Comments: 2+ edema in feet/calves, 1+ in thighs/hips b/l  Skin:    General: Skin is warm and dry.     Findings: No bruising, erythema or rash.  Neurological:     General: No focal deficit present.     Mental Status: She is alert.  Psychiatric:        Mood and Affect: Mood normal.        Behavior: Behavior normal.    Dialysis Orders:  MWF - [facility]  4hrs, BFR 350, DFR 800,  EDW 66.6kg, 3K/ 2.5Ca  Access: R IJ TDC, LU AVF +thrill  Heparin none Mircera 60 mcg q2wks - last 08/28/20 Hectorol 23mcg IV qHD   Assessment/Plan:  1. Volume Overload - 2/2 missed HD and weight loss.  Has been getting under EDW, working to establish correct EDW. -Plan for HD tomorrow 9/29 to maintain regular MWF schedule. -Dry weight: 66.6kg, last dialysis left at 64.5kg - will need lower EDW on d/c 2. Hypertensive Urgency -BP 200/88 on arrival and given Lopressor. No evidence of acute end-organ damage. -Continue home medications: Amlodipine and Carvedilol.  Hydralazine as needed. -Expect improvement post HD. 3. Hyperkalemia -Potassium 5.4 on admission, likely due to missed dialysis appointment. No EKG changes noted. 4. Anuric AKI on HD-  Likely progressed to ESRD.  On HD MWF.  Plan for HD tomorrow per regular schedule.  5.  Anemia of CKD - Hgb 13.2. no indication for ESA. 6.  Secondary Hyperparathyroidism -  Last pth 213. Ca in goal. Will check phos - in goal as OP. Not on binders.  Continue hectorol.  7.  Nutrition - Renal diet w/fluid restrictions 8. COPD/chronic hypoxemic respiratory failure on home O2 - on baseline O2 - 2L   Demetrios Isaacs, Student-PA 09/10/2020, 2:40 PM   Progress note written with assistance by PA student.  PA performed own history and physical exam.  Plan discussed with student and  documented.  Jen Mow, PA-C [employer] 09/10/2020, 3:28 PM

## 2020-09-10 NOTE — Progress Notes (Signed)
NEW ADMISSION NOTE New Admission Note:   Arrival Method:  [facility] Mental Orientation: A&O X4 Telemetry: 5M16 Assessment: Completed Skin: WDL IV: WDL  Pain: Denies Safety Measures: Safety Fall Prevention Plan has been given, discussed and signed Admission: Completed [facility] Orientation: Patient has been orientated to the room, unit and staff.   Orders have been reviewed and implemented. Will continue to monitor the patient. Call light has been placed within reach and bed alarm has been activated.   Aneta Mins BSN, RN3

## 2020-09-10 NOTE — H&P (Deleted)
[facility] Renal Consultation Note    Indication for Consultation:  Management of ESRD/hemodialysis; anemia, hypertension/volume and secondary hyperparathyroidism  WPY:KDXI, Hunt Oris, MD  Chief Complaint: Nausea/Vomiting/Diarrhea x 3 days, Weakness  HPI: Cassandra Dyer is a 79 y.o. female with recent history of anuric AKI likely progressed to ESRD, started on dialysis 07/27/2020 during [facility] admission from 8/13-8/26 for AKI thought to be due to ATN.  Unable to complete biopsy due to solitary kidney. Per outpatient records doubtful she will have recovery.  They have checked a 24 hour urine but results were not completed due issue with incorrect specimen collection.  Currently on HD MWF at [facility].    PMHx significant for renal cell carcinoma s/p R nephrectomy (2018), HTN, HLD, remote HCV, COPD, and chronic hypoxemic respiratory failure on home O2 (2L via [place]) who presents to the ED with a 3-day history of nausea, vomiting, and diarrhea. She states the vomiting started on Friday night when she had one episode of NBNB emesis after consuming homemade soup. She subsequently had diarrhea for the following three days. She reports minimal PO intake, including a glass of orange juice yesterday. She also reports feeling very weak with associated exertional dyspnea since this illness started--so much so that she missed her scheduled dialysis session yesterday. She does have bilateral lower extremity edema which she says has slightly worsened. She denies SOB on her baseline O2, orthopnea, cough, wheezing, sputum-production.  She is compliant with her dialysis regimen, medications, and follows fluid-restriction and renal diet at home. However, she has not taken her blood pressure medications this morning. She has been leaving under her dry weight for the last 3 treatments.    In the ED she was found to be hypertensive at 200/88, tachycardic at 104bpm, with sats of 95-97% on 2L [place]. BMP shows Na 136, K  5.4, Cl 99, CO2 22, BUN 49, Cr 7.88, Glu 121. CBC shows WBC 19.5, Hgb 13.2, Hct 14.1, Plt 188. She received 568mL of NS and Metoprolol. CXR showed stable bilateral effusions with associated atelectasis.  She has been admitted for further evaluation and management.   Past Medical History:  Diagnosis Date   Anemia    Hepatitis    hx of hep C - 10-20 years ago    History of blood transfusion    History of kidney cancer    Hyperlipidemia    Hypertension    not taken b/p med in 2-3 years md deceased never went to another md    Past Surgical History:  Procedure Laterality Date   AV FISTULA PLACEMENT Left 08/07/2020   Procedure: LEFT ARM ARTERIOVENOUS (AV) FISTULA CREATION;  Surgeon: [name], MD;  Location: [facility];  Service: Vascular;  Laterality: Left;   CATARACT EXTRACTION Bilateral    11/30 left /12/9 right   IR FLUORO GUIDE CV LINE RIGHT  07/27/2020   IR US GUIDE VASC ACCESS RIGHT  07/27/2020   ROBOT ASSISTED LAPAROSCOPIC NEPHRECTOMY Right 01/13/2017   Procedure: XI ROBOTIC ASSISTED LAPAROSCOPIC RADICAL NEPHRECTOMY WITH LYSIS OF ADHESION;  Surgeon: Alexis Frock, MD;  Location: [facility];  Service: Urology;  Laterality: Right;   Family History  Problem Relation Age of Onset   Hypertension Mother    Pancreatic cancer Father    Breast cancer Sister    Social History:  reports that she quit smoking about 19 years ago. Her smoking use included cigarettes. She has a 15.00 pack-year smoking history. She has never used smokeless tobacco. She reports  current alcohol use. She reports that she does not use drugs. No Known Allergies Prior to Admission medications   Medication Sig Start Date End Date Taking? Authorizing Provider  amLODipine (NORVASC) 10 MG tablet Take 1 tablet (10 mg total) by mouth daily. 04/19/20  Yes Biagio Borg, MD  Ascorbic Acid (VITAMIN C PO) Take 500 mg by mouth in the morning and at bedtime.    Yes [provider]  benzonatate (TESSALON) 200 MG  capsule Take 1 capsule (200 mg total) by mouth 3 (three) times daily as needed. Patient taking differently: Take 200 mg by mouth 3 (three) times daily as needed for cough.  07/22/20  Yes Marrian Salvage, FNP  Black Currant Seed Oil 500 MG CAPS Take 500 mg by mouth daily.   Yes [provider]  Calcium Carbonate-Vit D-Min (CALCIUM 1200 PO) Take 1,200 mg by mouth daily.    Yes [provider]  carvedilol (COREG) 12.5 MG tablet Take 1 tablet (12.5 mg total) by mouth 2 (two) times daily with a meal. 08/08/20  Yes Georgette Shell, MD  Cholecalciferol (VITAMIN D PO) Take 2,000 mcg by mouth See admin instructions. Takes 2000 mcg 3 times a week ( monday, wednesday and friday)   Yes [provider]  Garlic 3716 MG CAPS Take 2,000 mg by mouth daily.    Yes [provider]  Multiple Vitamin (MULTIVITAMIN WITH MINERALS) TABS tablet Take 1 tablet by mouth daily.   Yes [provider]  Omega-3 Fatty Acids (FISH OIL PO) Take 1 tablet by mouth daily.   Yes [provider]  ondansetron (ZOFRAN) 4 MG tablet Take 1 tablet (4 mg total) by mouth every 8 (eight) hours as needed for nausea or vomiting. 04/19/20  Yes Biagio Borg, MD  OVER THE COUNTER MEDICATION Take 1 capsule by mouth daily. Mega juice   Yes [provider]  polyethylene glycol (MIRALAX / GLYCOLAX) 17 g packet Take 17 g by mouth 2 (two) times daily. Patient taking differently: Take 17 g by mouth daily as needed for mild constipation.  08/08/20  Yes Georgette Shell, MD  rosuvastatin (CRESTOR) 10 MG tablet Take 1 tablet by mouth once daily Patient taking differently: Take 10 mg by mouth daily.  07/22/20  Yes Biagio Borg, MD  senna-docusate (SENOKOT-S) 8.6-50 MG tablet Take 2 tablets by mouth 2 (two) times daily. Patient taking differently: Take 1 tablet by mouth daily as needed for mild constipation.  08/08/20  Yes Georgette Shell, MD  hydrALAZINE (APRESOLINE) 100 MG tablet Take  1 tablet (100 mg total) by mouth every 8 (eight) hours. Patient not taking: Reported on 09/10/2020 08/08/20   Georgette Shell, MD   Current Facility-Administered Medications  Medication Dose Route Frequency Provider Last Rate Last Admin   acetaminophen (TYLENOL) tablet 650 mg  650 mg Oral Q6H PRN Pahwani, Rinka R, MD       Or   acetaminophen (TYLENOL) suppository 650 mg  650 mg Rectal Q6H PRN Pahwani, Rinka R, MD       amLODipine (NORVASC) tablet 10 mg  10 mg Oral Daily Pahwani, Rinka R, MD       carvedilol (COREG) tablet 12.5 mg  12.5 mg Oral BID WC Pahwani, Rinka R, MD       heparin injection 5,000 Units  5,000 Units Subcutaneous Q8H Pahwani, Rinka R, MD       hydrALAZINE (APRESOLINE) injection 10 mg  10 mg Intravenous Q6H PRN Pahwani, Rinka  R, MD       ondansetron (ZOFRAN) tablet 4 mg  4 mg Oral Q6H PRN Pahwani, Rinka R, MD       Or   ondansetron (ZOFRAN) injection 4 mg  4 mg Intravenous Q6H PRN Pahwani, Rinka R, MD       rosuvastatin (CRESTOR) tablet 10 mg  10 mg Oral Daily Pahwani, Rinka R, MD       Current Outpatient Medications  Medication Sig Dispense Refill   amLODipine (NORVASC) 10 MG tablet Take 1 tablet (10 mg total) by mouth daily. 90 tablet 3   Ascorbic Acid (VITAMIN C PO) Take 500 mg by mouth in the morning and at bedtime.      benzonatate (TESSALON) 200 MG capsule Take 1 capsule (200 mg total) by mouth 3 (three) times daily as needed. (Patient taking differently: Take 200 mg by mouth 3 (three) times daily as needed for cough. ) 30 capsule 0   Black Currant Seed Oil 500 MG CAPS Take 500 mg by mouth daily.     Calcium Carbonate-Vit D-Min (CALCIUM 1200 PO) Take 1,200 mg by mouth daily.      carvedilol (COREG) 12.5 MG tablet Take 1 tablet (12.5 mg total) by mouth 2 (two) times daily with a meal. 60 tablet 1   Cholecalciferol (VITAMIN D PO) Take 2,000 mcg by mouth See admin instructions. Takes 2000 mcg 3 times a week ( monday, wednesday and friday)     Garlic  3536 MG CAPS Take 2,000 mg by mouth daily.      Multiple Vitamin (MULTIVITAMIN WITH MINERALS) TABS tablet Take 1 tablet by mouth daily.     Omega-3 Fatty Acids (FISH OIL PO) Take 1 tablet by mouth daily.     ondansetron (ZOFRAN) 4 MG tablet Take 1 tablet (4 mg total) by mouth every 8 (eight) hours as needed for nausea or vomiting. 30 tablet 1   OVER THE COUNTER MEDICATION Take 1 capsule by mouth daily. Mega juice     polyethylene glycol (MIRALAX / GLYCOLAX) 17 g packet Take 17 g by mouth 2 (two) times daily. (Patient taking differently: Take 17 g by mouth daily as needed for mild constipation. ) 14 each 0   rosuvastatin (CRESTOR) 10 MG tablet Take 1 tablet by mouth once daily (Patient taking differently: Take 10 mg by mouth daily. ) 90 tablet 0   senna-docusate (SENOKOT-S) 8.6-50 MG tablet Take 2 tablets by mouth 2 (two) times daily. (Patient taking differently: Take 1 tablet by mouth daily as needed for mild constipation. )     hydrALAZINE (APRESOLINE) 100 MG tablet Take 1 tablet (100 mg total) by mouth every 8 (eight) hours. (Patient not taking: Reported on 09/10/2020) 90 tablet 4   Labs: Basic Metabolic Panel: Recent Labs  Lab 09/09/20 1442 09/10/20 0753  NA 138 136  K 5.4* 5.4*  CL 98 99  CO2 27 22  GLUCOSE 108* 121*  BUN 34* 49*  CREATININE 6.61* 7.88*  CALCIUM 9.1 9.1   CBC: Recent Labs  Lab 09/09/20 1442  WBC 19.5*  HGB 13.2  HCT 41.4  MCV 94.7  PLT 188   Studies/Results: DG Chest Portable 1 View  Result Date: 09/10/2020 CLINICAL DATA:  Weakness. EXAM: PORTABLE CHEST 1 VIEW COMPARISON:  August 04, 2020. FINDINGS: Stable cardiomediastinal silhouette. Right internal jugular dialysis catheter is unchanged in position. No pneumothorax is noted. Mild to moderate bilateral pleural effusions are noted with associated atelectasis. Bony thorax is unremarkable. IMPRESSION: Mild to moderate bilateral  pleural effusions with associated atelectasis. Aortic Atherosclerosis  (ICD10-I70.0). Electronically Signed   By: Marijo Conception M.D.   On: 09/10/2020 09:30     Review of Systems  Constitutional: Positive for appetite change and fatigue. Negative for chills, diaphoresis and fever.  HENT: Negative for congestion and facial swelling.   Eyes: Negative for visual disturbance.  Respiratory: Positive for cough and shortness of breath. Negative for chest tightness and wheezing.   Cardiovascular: Positive for palpitations and leg swelling. Negative for chest pain.  Gastrointestinal: Positive for diarrhea, nausea and vomiting. Negative for abdominal distention and blood in stool.  Genitourinary: Negative for dysuria, flank pain, frequency and hematuria.  Neurological: Positive for weakness. Negative for headaches.  All other systems reviewed and are negative.  Physical Exam: Vitals:   09/10/20 0927 09/10/20 1300 09/10/20 1330 09/10/20 1500  BP: (!) 194/92 (!) 194/86 (!) 173/120 (!) 185/94  Pulse: (!) 119 (!) 104  (!) 103  Resp: (!) 24 (!) 24  14  Temp: 98.3 F (36.8 C)     TempSrc: Oral     SpO2: 100% 98%  100%  Weight:      Height:       Physical Exam Constitutional:      Appearance: Normal appearance.     Comments: Elderly African American female laying comfortably in bed in no acute distress.  HENT:     Head: Normocephalic and atraumatic.  Eyes:     Extraocular Movements: Extraocular movements intact.     Conjunctiva/sclera: Conjunctivae normal.  Cardiovascular:     Rate and Rhythm: Regular rhythm. Tachycardia present.     Pulses: Normal pulses.     Heart sounds: No murmur heard.  No friction rub. No gallop.   Pulmonary:     Breath sounds: No stridor. No wheezing, rhonchi or rales.     Comments: Positive whispered egophony. Decreased lung sounds at the bases bilaterally. Chest:     Chest wall: No tenderness.  Abdominal:     General: Abdomen is flat. Bowel sounds are normal. There is no distension.     Palpations: Abdomen is soft.      Tenderness: There is no abdominal tenderness.  Musculoskeletal:     Cervical back: Normal range of motion and neck supple.     Right lower leg: Edema present.     Left lower leg: Edema present.     Comments: 2+ edema in feet/calves, 1+ in thighs/hips b/l  Skin:    General: Skin is warm and dry.     Findings: No bruising, erythema or rash.  Neurological:     General: No focal deficit present.     Mental Status: She is alert.  Psychiatric:        Mood and Affect: Mood normal.        Behavior: Behavior normal.    Dialysis Orders:  MWF - [facility]  4hrs, BFR 350, DFR 800,  EDW 66.6kg, 3K/ 2.5Ca  Access: R IJ TDC, LU AVF +thrill  Heparin none Mircera 60 mcg q2wks - last 08/28/20 Hectorol 42mcg IV qHD   Assessment/Plan:  1. Volume Overload - 2/2 missed HD and weight loss.  Has been getting under EDW, working to establish correct EDW. -Plan for HD tomorrow 9/29 to maintain regular MWF schedule. -Dry weight: 66.6kg, last dialysis left at 64.5kg - will need lower EDW on d/c 2. Hypertensive Urgency -BP 200/88 on arrival and given Lopressor. No evidence of acute end-organ damage. -Continue home medications: Amlodipine and Carvedilol.  Hydralazine as needed. -Expect improvement post HD. 3. Hyperkalemia -Potassium 5.4 on admission, likely due to missed dialysis appointment. No EKG changes noted. 4. Anuric AKI on HD-  Likely progressed to ESRD.  On HD MWF.  Plan for HD tomorrow per regular schedule.  5.  Anemia of CKD - Hgb 13.2. no indication for ESA. 6.  Secondary Hyperparathyroidism -  Last pth 213. Ca in goal. Will check phos - in goal as OP. Not on binders.  Continue hectorol.  7.  Nutrition - Renal diet w/fluid restrictions 8. COPD/chronic hypoxemic respiratory failure on home O2 - on baseline O2 - 2L   Demetrios Isaacs, Student-PA 09/10/2020, 2:40 PM   Progress note written with assistance by PA student.  PA performed own history and physical exam.  Plan discussed with student and  documented.  Jen Mow, PA-C [employer] 09/10/2020, 3:28 PM

## 2020-09-10 NOTE — ED Provider Notes (Signed)
[name]   CSN: [id number] Arrival date & time: 09/09/20  1347     History Chief Complaint  Patient presents with  . Diarrhea  . Emesis  . Weakness    Cassandra Dyer is a 79 y.o. female with past medical history significant for anemia, hyperlipidemia, hypertension CKD, dialysis MWF at [facility], chronc O2 2L [place], s/p right nephrectomy in 2018. Had covid vaccinations.  HPI Patient presents to emergency department today with chief complaint of progressively worsening nausea, emesis and diarrhea x3 days.  Denies being in any pain.  Today is Tuesday, patient's last dialysis was Friday.  She missed yesterday because she was not feeling well. She has been unable to tolerate any p.o. intake since symptom onset.  She has not been able to take her home medications as well. She has not had any PO intake in two days therefore no emesis in the last 24 hours. Prior to that she had about 7 episodes of nonbloody non bilious emesis.Patient is endorsing diarrhea that she describes as liquid stool.  She has had 2 episodes to count was 24 hours.  She denies seeing any blood in stool and stool is not black in color.  No recent antibiotic use. Patient also endorses chills, worsening lower extremity edema..No suspicious food consumption. No sick contacts. No known covid exposures. She denies fever, cough, shortness of breath, chest pain, abdominal pain, urinary symptoms, rash.       Past Medical History:  Diagnosis Date  . Anemia   . Hepatitis    hx of hep C - 10-20 years ago   . History of blood transfusion   . History of kidney cancer   . Hyperlipidemia   . Hypertension    not taken b/p med in 2-3 years md deceased never went to another md     Patient Active Problem List   Diagnosis Date Noted  . Nausea vomiting and diarrhea 09/10/2020  . ESRD (end stage renal disease) ([place])   . Volume overload   . Cough 08/04/2020  .  Thrombocytopenia ([place]) 08/02/2020  . Anemia 08/02/2020  . Hyperkalemia 07/27/2020  . Hyperlipidemia 07/27/2020  . Metabolic acidosis 73/41/9379  . CKD (chronic kidney disease) 05/23/2020  . Solitary kidney, acquired 05/23/2020  . Fever 04/26/2020  . Acute hypoxemic respiratory failure ([place]) 04/19/2020  . Diarrhea 04/19/2020  . Nausea & vomiting 04/19/2020  . Pneumonia 04/09/2020  . Acute kidney injury (AKI) with acute tubular necrosis (ATN) ([place]) 04/09/2020  . Hypertensive urgency 04/09/2020  . History of hepatitis C 04/09/2020  . Sepsis ([place]) 04/08/2020  . Venous insufficiency 12/28/2019  . Hyperglycemia 06/29/2019  . Osteoporosis 02/20/2018  . Essential hypertension 02/15/2017  . Dyslipidemia 02/15/2017  . Renal cell carcinoma of right kidney ([place]) 02/15/2017    Past Surgical History:  Procedure Laterality Date  . AV FISTULA PLACEMENT Left 08/07/2020   Procedure: LEFT ARM ARTERIOVENOUS (AV) FISTULA CREATION;  Surgeon: [name], MD;  Location: [facility];  Service: Vascular;  Laterality: Left;  . CATARACT EXTRACTION Bilateral    11/30 left /12/9 right  . IR FLUORO GUIDE CV LINE RIGHT  07/27/2020  . IR US GUIDE VASC ACCESS RIGHT  07/27/2020  . ROBOT ASSISTED LAPAROSCOPIC NEPHRECTOMY Right 01/13/2017   Procedure: XI ROBOTIC ASSISTED LAPAROSCOPIC RADICAL NEPHRECTOMY WITH LYSIS OF ADHESION;  Surgeon: Alexis Frock, MD;  Location: [facility];  Service: Urology;  Laterality: Right;     OB History   No  obstetric history on file.     Family History  Problem Relation Age of Onset  . Hypertension Mother   . Pancreatic cancer Father   . Breast cancer Sister     Social History   Tobacco Use  . Smoking status: Former Smoker    Packs/day: 0.50    Years: 30.00    Pack years: 15.00    Types: Cigarettes    Quit date: 12/14/2000    Years since quitting: 19.7  . Smokeless tobacco: Never Used  Vaping Use  . Vaping Use: Never used  Substance Use Topics  . Alcohol use: Yes     Comment: occasional wine   . Drug use: No    Home Medications Prior to Admission medications   Medication Sig Start Date End Date Taking? Authorizing Provider  amLODipine (NORVASC) 10 MG tablet Take 1 tablet (10 mg total) by mouth daily. 04/19/20  Yes Biagio Borg, MD  Ascorbic Acid (VITAMIN C PO) Take 500 mg by mouth in the morning and at bedtime.    Yes [provider]  benzonatate (TESSALON) 200 MG capsule Take 1 capsule (200 mg total) by mouth 3 (three) times daily as needed. Patient taking differently: Take 200 mg by mouth 3 (three) times daily as needed for cough.  07/22/20  Yes Marrian Salvage, FNP  Black Currant Seed Oil 500 MG CAPS Take 500 mg by mouth daily.   Yes [provider]  Calcium Carbonate-Vit D-Min (CALCIUM 1200 PO) Take 1,200 mg by mouth daily.    Yes [provider]  carvedilol (COREG) 12.5 MG tablet Take 1 tablet (12.5 mg total) by mouth 2 (two) times daily with a meal. 08/08/20  Yes Georgette Shell, MD  Cholecalciferol (VITAMIN D PO) Take 2,000 mcg by mouth See admin instructions. Takes 2000 mcg 3 times a week ( monday, wednesday and friday)   Yes [provider]  Garlic 9518 MG CAPS Take 2,000 mg by mouth daily.    Yes [provider]  Multiple Vitamin (MULTIVITAMIN WITH MINERALS) TABS tablet Take 1 tablet by mouth daily.   Yes [provider]  Omega-3 Fatty Acids (FISH OIL PO) Take 1 tablet by mouth daily.   Yes [provider]  ondansetron (ZOFRAN) 4 MG tablet Take 1 tablet (4 mg total) by mouth every 8 (eight) hours as needed for nausea or vomiting. 04/19/20  Yes Biagio Borg, MD  OVER THE COUNTER MEDICATION Take 1 capsule by mouth daily. Mega juice   Yes [provider]  polyethylene glycol (MIRALAX / GLYCOLAX) 17 g packet Take 17 g by mouth 2 (two) times daily. Patient taking differently: Take 17 g by mouth daily as needed for mild constipation.  08/08/20  Yes Georgette Shell, MD    rosuvastatin (CRESTOR) 10 MG tablet Take 1 tablet by mouth once daily Patient taking differently: Take 10 mg by mouth daily.  07/22/20  Yes Biagio Borg, MD  senna-docusate (SENOKOT-S) 8.6-50 MG tablet Take 2 tablets by mouth 2 (two) times daily. Patient taking differently: Take 1 tablet by mouth daily as needed for mild constipation.  08/08/20  Yes Georgette Shell, MD  hydrALAZINE (APRESOLINE) 100 MG tablet Take 1 tablet (100 mg total) by mouth every 8 (eight) hours. Patient not taking: Reported on 09/10/2020 08/08/20   Georgette Shell, MD    Allergies    Patient has no known allergies.  Review of Systems   Review of Systems All other systems are  reviewed and are negative for acute change except as noted in the HPI.  Physical Exam Updated Vital Signs BP (!) 213/96   Pulse (!) 119   Temp 98.1 F (36.7 C) (Oral)   Resp 16   Ht 5\' 2"  (1.575 m)   Wt 63.5 kg   SpO2 100%   BMI 25.61 kg/m   Physical Exam Vitals and nursing note reviewed.  Constitutional:      General: She is not in acute distress.    Appearance: She is not ill-appearing.  HENT:     Head: Normocephalic and atraumatic.     Right Ear: Tympanic membrane and external ear normal.     Left Ear: Tympanic membrane and external ear normal.     Nose: Nose normal.     Mouth/Throat:     Mouth: Mucous membranes are dry.     Pharynx: Oropharynx is clear.  Eyes:     General: No scleral icterus.       Right eye: No discharge.        Left eye: No discharge.     Extraocular Movements: Extraocular movements intact.     Conjunctiva/sclera: Conjunctivae normal.     Pupils: Pupils are equal, round, and reactive to light.  Neck:     Vascular: No JVD.  Cardiovascular:     Rate and Rhythm: Regular rhythm. Tachycardia present.     Pulses: Normal pulses.          Radial pulses are 2+ on the right side and 2+ on the left side.     Heart sounds: Normal heart sounds.  Pulmonary:     Comments: Lungs clear to auscultation in  all fields. Symmetric chest rise. No wheezing, rales, or rhonchi.  On 2 L nasal cannula, which is home oxygen requirement and has oxygen saturation 100%. Abdominal:     Palpations: There is no mass.     Tenderness: There is no right CVA tenderness or left CVA tenderness.     Hernia: No hernia is present.     Comments: Abdomen is soft, non-distended, and non-tender in all quadrants. No rigidity, no guarding. No peritoneal signs.  Musculoskeletal:        General: Normal range of motion.     Cervical back: Normal range of motion.     Comments: Bilateral 2+ pitting pedal edema extending to knees  Skin:    General: Skin is warm and dry.     Capillary Refill: Capillary refill takes less than 2 seconds.     Comments: Fistula in left upper extremity  Neurological:     Mental Status: She is oriented to person, place, and time.     GCS: GCS eye subscore is 4. GCS verbal subscore is 5. GCS motor subscore is 6.     Comments: Fluent speech, no facial droop.  Psychiatric:        Behavior: Behavior normal.     ED Results / Procedures / Treatments   Labs (all labs ordered are listed, but only abnormal results are displayed) Labs Reviewed  BASIC METABOLIC PANEL - Abnormal; Notable for the following components:      Result Value   Potassium 5.4 (*)    Glucose, Bld 108 (*)    BUN 34 (*)    Creatinine, Ser 6.61 (*)    GFR calc non Af Amer 6 (*)    GFR calc Af Amer 6 (*)    All other components within normal limits  CBC - Abnormal; Notable  for the following components:   WBC 19.5 (*)    All other components within normal limits  BASIC METABOLIC PANEL - Abnormal; Notable for the following components:   Potassium 5.4 (*)    Glucose, Bld 121 (*)    BUN 49 (*)    Creatinine, Ser 7.88 (*)    GFR calc non Af Amer 4 (*)    GFR calc Af Amer 5 (*)    All other components within normal limits  RESPIRATORY PANEL BY RT PCR (FLU A&B, COVID)  CULTURE, BLOOD (ROUTINE X 2)  CULTURE, BLOOD (ROUTINE X 2)    GASTROINTESTINAL PANEL BY PCR, STOOL (REPLACES STOOL CULTURE)  LACTIC ACID, PLASMA  URINALYSIS, ROUTINE W REFLEX MICROSCOPIC  CBC  CREATININE, SERUM  MAGNESIUM  URINALYSIS, ROUTINE W REFLEX MICROSCOPIC  LIPASE, BLOOD  CBC WITH DIFFERENTIAL/PLATELET    EKG EKG Interpretation  Date/Time:  Monday September 09 2020 14:42:13 EDT Ventricular Rate:  114 PR Interval:  130 QRS Duration: 68 QT Interval:  312 QTC Calculation: 430 R Axis:   14 Text Interpretation: Sinus tachycardia Nonspecific ST abnormality Abnormal ECG When compared with ECG of 07/27/2020, HEART RATE has increased Confirmed by Delora Fuel ([id number]) on 09/09/2020 11:26:33 PM   Radiology DG Chest Portable 1 View  Result Date: 09/10/2020 CLINICAL DATA:  Weakness. EXAM: PORTABLE CHEST 1 VIEW COMPARISON:  August 04, 2020. FINDINGS: Stable cardiomediastinal silhouette. Right internal jugular dialysis catheter is unchanged in position. No pneumothorax is noted. Mild to moderate bilateral pleural effusions are noted with associated atelectasis. Bony thorax is unremarkable. IMPRESSION: Mild to moderate bilateral pleural effusions with associated atelectasis. Aortic Atherosclerosis (ICD10-I70.0). Electronically Signed   By: Marijo Conception M.D.   On: 09/10/2020 09:30    Procedures Procedures (including critical care time)  Medications Ordered in ED Medications  heparin injection 5,000 Units (has no administration in time range)  acetaminophen (TYLENOL) tablet 650 mg (has no administration in time range)    Or  acetaminophen (TYLENOL) suppository 650 mg (has no administration in time range)  ondansetron (ZOFRAN) tablet 4 mg (has no administration in time range)    Or  ondansetron (ZOFRAN) injection 4 mg (has no administration in time range)  hydrALAZINE (APRESOLINE) injection 10 mg (has no administration in time range)  amLODipine (NORVASC) tablet 10 mg (has no administration in time range)  carvedilol (COREG) tablet 12.5 mg  (has no administration in time range)  rosuvastatin (CRESTOR) tablet 10 mg (has no administration in time range)  ondansetron (ZOFRAN) injection 4 mg (4 mg Intravenous Given 09/10/20 1209)  sodium chloride 0.9 % bolus 500 mL (500 mLs Intravenous New Bag/Given 09/10/20 1210)  metoprolol tartrate (LOPRESSOR) injection 5 mg (5 mg Intravenous Given 09/10/20 1210)    ED Course  I have reviewed the triage vital signs and the nursing notes.  Pertinent labs & imaging results that were available during my care of the patient were reviewed by me and considered in my medical decision making (see chart for details).  Clinical Course as of Sep 10 1410  Tue Sep 10, 2020  0930 On my initial exam patient is hypertensive.  She has not taken her home medications including Coreg, amlodipine, or hydralazine secondary to nausea and emesis. She denies any headache or visual changes  BP(!): 213/96 [KA]  1149 Delay in getting IV because patient is hard stick. IV team placed IV. Patient's blood pressure is 220/102, will give IV lopressor, small fluids bolus, and zofran   [KA]  1240 BP  after lopressor is slightly improved.  Tachycardia improving  BP(!): 194/92 [KA]    Clinical Course User Index [KA] Vianne Grieshop, Harley Hallmark, PA-C   MDM Rules/Calculators/A&P                          History provided by patient with additional history obtained from chart review.    Patient seen and examined. Patient presents awake, alert, hemodynamically stable, afebrile, non toxic. Patient noted to be hypertensive and tachycardic on my initial exam. She had prolonged wait time of 19 hours in the lobby secondary to high volume. She has had no PO intake in at least 24 hours, not taking home meds which includes BP meds. She has dry mucus membranes, no cough, normal work of breathing on home O2,  no abdominal tenderness. She does have 2+ pitting pedal edema extending to knees. EKG shows tachycardia.  Labs show: -CBC with leukocytosis 19.5,  no anemia -BMP with hyperkalemia 5.4, BUN/CR 48/7.88, GFR 5. Normal anion gap. Patient has solitary kidney and baseline Cr looks to be around 3.   Chest xray shows patient likely volume overloaded, has bilateral mild to moderate pleural effusions. On exam she has normal work of breathing, no cough, no abdominal tenderness.   She has leukocytosis with diarrhea, nausea, and emesis, had chills and none now. Not on steroids. Feel like infection is less likely source. Will add on lactic acid and blood cultures, will hold off on antibiotics at this time given reassuring exam. Latic acid is in normal range.  Patient with renal disease who appears to be decompensating based on labs today. This case was discussed with Dr. Jeanell Sparrow who has seen the patient and agrees with plan to admit. Consulted on call nephrologist and discussed case with Dr. Royce Macadamia. Patient will need dialysis, not emergently. Nephrology to come evaluate patient. Assigned admission. Spoke with Dr. Doristine Bosworth with hospitalist service who agrees to assume care of patient and bring into the hospital for further evaluation and management.   Portions of this note were generated with [employer]. Dictation errors may occur despite best attempts at proofreading.   Final Clinical Impression(s) / ED Diagnoses Final diagnoses:  Nausea vomiting and diarrhea    Rx / DC Orders ED Discharge Orders    None       Cherre Robins, PA-C 09/10/20 1411    Pattricia Boss, MD 09/13/20 1329

## 2020-09-11 DIAGNOSIS — N186 End stage renal disease: Secondary | ICD-10-CM | POA: Diagnosis not present

## 2020-09-11 DIAGNOSIS — R197 Diarrhea, unspecified: Secondary | ICD-10-CM

## 2020-09-11 DIAGNOSIS — R112 Nausea with vomiting, unspecified: Secondary | ICD-10-CM | POA: Diagnosis not present

## 2020-09-11 LAB — COMPREHENSIVE METABOLIC PANEL
ALT: 14 U/L (ref 0–44)
AST: 29 U/L (ref 15–41)
Albumin: 2.1 g/dL — ABNORMAL LOW (ref 3.5–5.0)
Alkaline Phosphatase: 55 U/L (ref 38–126)
Anion gap: 17 — ABNORMAL HIGH (ref 5–15)
BUN: 56 mg/dL — ABNORMAL HIGH (ref 8–23)
CO2: 22 mmol/L (ref 22–32)
Calcium: 8.4 mg/dL — ABNORMAL LOW (ref 8.9–10.3)
Chloride: 97 mmol/L — ABNORMAL LOW (ref 98–111)
Creatinine, Ser: 9.03 mg/dL — ABNORMAL HIGH (ref 0.44–1.00)
GFR calc Af Amer: 4 mL/min — ABNORMAL LOW (ref >60)
GFR calc non Af Amer: 4 mL/min — ABNORMAL LOW (ref >60)
Glucose, Bld: 87 mg/dL (ref 70–99)
Potassium: 5.7 mmol/L — ABNORMAL HIGH (ref 3.5–5.1)
Sodium: 136 mmol/L (ref 135–145)
Total Bilirubin: 1.1 mg/dL (ref 0.3–1.2)
Total Protein: 4.3 g/dL — ABNORMAL LOW (ref 6.5–8.1)

## 2020-09-11 LAB — GASTROINTESTINAL PANEL BY PCR, STOOL (REPLACES STOOL CULTURE)

## 2020-09-11 LAB — CBC
HCT: 31.7 % — ABNORMAL LOW (ref 36.0–46.0)
Hemoglobin: 10.4 g/dL — ABNORMAL LOW (ref 12.0–15.0)
MCH: 30.5 pg (ref 26.0–34.0)
MCHC: 32.8 g/dL (ref 30.0–36.0)
MCV: 93 fL (ref 80.0–100.0)
Platelets: 128 10*3/uL — ABNORMAL LOW (ref 150–400)
RBC: 3.41 MIL/uL — ABNORMAL LOW (ref 3.87–5.11)
RDW: 14.4 % (ref 11.5–15.5)
WBC: 13.8 10*3/uL — ABNORMAL HIGH (ref 4.0–10.5)
nRBC: 0 % (ref 0.0–0.2)

## 2020-09-11 MED ORDER — SODIUM BICARBONATE 650 MG PO TABS
1300.0000 mg | ORAL_TABLET | Freq: Once | ORAL | Status: AC
  Administered 2020-09-12: 1300 mg via ORAL
  Filled 2020-09-11: qty 2

## 2020-09-11 MED ORDER — ORAL CARE MOUTH RINSE
15.0000 mL | Freq: Two times a day (BID) | OROMUCOSAL | Status: DC
  Administered 2020-09-12: 15 mL via OROMUCOSAL

## 2020-09-11 MED ORDER — DARBEPOETIN ALFA 60 MCG/0.3ML IJ SOSY
60.0000 ug | PREFILLED_SYRINGE | INTRAMUSCULAR | Status: DC
  Filled 2020-09-11: qty 0.3

## 2020-09-11 MED ORDER — DOXERCALCIFEROL 4 MCG/2ML IV SOLN
INTRAVENOUS | Status: AC
  Filled 2020-09-11: qty 2

## 2020-09-11 MED ORDER — HEPARIN SODIUM (PORCINE) 1000 UNIT/ML IJ SOLN
INTRAMUSCULAR | Status: AC
  Administered 2020-09-11: 3200 [IU]
  Filled 2020-09-11: qty 4

## 2020-09-11 NOTE — Consult Note (Signed)
   [facility] Inpatient Consult   09/11/2020  Cassandra Dyer Jun 19, 1941 [id number]   [facility] [ACO] Patient:  Medicare NextGen  Patient with extreme high risk score for unplanned readmission with 3 hospitalizations in the past 6 months.  Patient was assessed for [facility] for community services. Patient was previously active with [facility].   Plan: Will continue to follow for disposition and needs.   Primary Care Provider:  Biagio Borg, MD, [place]  Of note, [facility] services does not replace or interfere with any services that are arranged by inpatient [facility] care management team.   For additional questions or referrals please contact:  Natividad Brood, RN BSN [facility] Liaison  [phone] business mobile phone Toll free office [phone]  Fax number: [phone] [email or web address]

## 2020-09-11 NOTE — Progress Notes (Signed)
PROGRESS NOTE    Cassandra Dyer  TDD:[id number] DOB: [birth year] DOA: 09/09/2020 PCP: Biagio Borg, MD  Brief Narrative: 79 year old female with history of renal cell carcinoma status post right nephrectomy in 2018, ESRD started hemodialysis in 8/21, COPD, chronic respiratory failure on 2 L home O2 hepatitis C, hypertension, presented to the emergency room with nausea vomiting diarrhea and generalized weakness. -She also missed hemodialysis treatment -In the emergency room she was noted to be hypertensive, tachycardic, work-up noted signs of fluid overload with bilateral pleural effusions  Assessment & Plan:   Nausea vomiting and diarrhea -Improving with supportive care -Suspect viral gastroenteritis, reportedly started after drinking soup on Thursday -Try soft diet today -Ambulate  Hyperkalemia Fluid overload Bilateral pleural effusions ESRD on hemodialysis since 8/21 -Missed HD x1 -Nephrology consulting  Hypertensive urgency -Due to fluid overloaded state -Improving with HD, continue Coreg and amlodipine  COPD/chronic respiratory failure -On 2 L home O2 at baseline -No wheezing  Leukocytosis -Likely reactive, improving  DVT prophylaxis: Heparin subcutaneous Code Status: Full code Family Communication: Discussed with patient in detail, no family at bedside Disposition Plan:  Status is: Inpatient  Remains inpatient appropriate because:Inpatient level of care appropriate due to severity of illness   Dispo: The patient is from: Home              Anticipated d/c is to: Home              Anticipated d/c date is: Tomorrow              Patient currently is not medically stable to d/c. Consultants:   Renal   Procedures:   Antimicrobials:    Subjective: -Feels better today, no further diarrhea, denies any vomiting today  Objective: Vitals:   09/10/20 2105 09/11/20 0105 09/11/20 0528 09/11/20 0843  BP: (!) 161/75 (!) 158/71 (!) 178/72 (!) 182/64  Pulse: 91 88  94 97  Resp: 18 18 18 18   Temp: 98.2 F (36.8 C) 98.1 F (36.7 C) 98 F (36.7 C) 98 F (36.7 C)  TempSrc:    Oral  SpO2: 100% 97% 100% 98%  Weight:      Height:        Intake/Output Summary (Last 24 hours) at 09/11/2020 1407 Last data filed at 09/11/2020 1215 Gross per 24 hour  Intake 360 ml  Output 0 ml  Net 360 ml   Filed Weights   09/09/20 1440 09/10/20 1700  Weight: 63.5 kg 65 kg    Examination:  General exam: Obese pleasant female sitting up in bed, AAOx3, no distress Respiratory system: Decreased breath sounds the bases Cardiovascular system: S1 & S2 heard, RRR. Gastrointestinal system: Abdomen is nondistended, soft and nontender.Normal bowel sounds heard. Central nervous system: Alert and oriented. No focal neurological deficits. Extremities: No edema Skin: No rashes on exposed skin  psychiatry: Mood & affect appropriate.     Data Reviewed:   CBC: Recent Labs  Lab 09/09/20 1442 09/10/20 1530 09/11/20 0206  WBC 19.5* 18.9* 13.8*  NEUTROABS  --  15.8*  --   HGB 13.2 11.7* 10.4*  HCT 41.4 36.6 31.7*  MCV 94.7 92.7 93.0  PLT 188 163 623*   Basic Metabolic Panel: Recent Labs  Lab 09/09/20 1442 09/10/20 0753 09/10/20 1530 09/11/20 0206  NA 138 136  --  136  K 5.4* 5.4*  --  5.7*  CL 98 99  --  97*  CO2 27 22  --  22  GLUCOSE 108* 121*  --  87  BUN 34* 49*  --  56*  CREATININE 6.61* 7.88* 8.31* 9.03*  CALCIUM 9.1 9.1  --  8.4*  MG  --   --  2.1  --    GFR: Estimated Creatinine Clearance: 4.6 mL/min (A) (by C-G formula based on SCr of 9.03 mg/dL (H)). Liver Function Tests: Recent Labs  Lab 09/11/20 0206  AST 29  ALT 14  ALKPHOS 55  BILITOT 1.1  PROT 4.3*  ALBUMIN 2.1*   Recent Labs  Lab 09/10/20 1530  LIPASE 44   No results for input(s): AMMONIA in the last 168 hours. Coagulation Profile: No results for input(s): INR, PROTIME in the last 168 hours. Cardiac Enzymes: No results for input(s): CKTOTAL, CKMB, CKMBINDEX, TROPONINI in  the last 168 hours. BNP (last 3 results) No results for input(s): PROBNP in the last 8760 hours. HbA1C: No results for input(s): HGBA1C in the last 72 hours. CBG: No results for input(s): GLUCAP in the last 168 hours. Lipid Profile: No results for input(s): CHOL, HDL, LDLCALC, TRIG, CHOLHDL, LDLDIRECT in the last 72 hours. Thyroid Function Tests: No results for input(s): TSH, T4TOTAL, FREET4, T3FREE, THYROIDAB in the last 72 hours. Anemia Panel: No results for input(s): VITAMINB12, FOLATE, FERRITIN, TIBC, IRON, RETICCTPCT in the last 72 hours. Urine analysis:    Component Value Date/Time   COLORURINE YELLOW 07/28/2020 1131   APPEARANCEUR HAZY (A) 07/28/2020 1131   LABSPEC 1.016 07/28/2020 1131   PHURINE 5.0 07/28/2020 1131   GLUCOSEU NEGATIVE 07/28/2020 1131   GLUCOSEU NEGATIVE 04/26/2020 0909   HGBUR NEGATIVE 07/28/2020 1131   BILIRUBINUR NEGATIVE 07/28/2020 1131   KETONESUR NEGATIVE 07/28/2020 1131   PROTEINUR NEGATIVE 07/28/2020 1131   UROBILINOGEN 0.2 04/26/2020 0909   NITRITE NEGATIVE 07/28/2020 1131   LEUKOCYTESUR LARGE (A) 07/28/2020 1131   Sepsis Labs: @LABRCNTIP (procalcitonin:4,lacticidven:4)  ) Recent Results (from the past 240 hour(s))  Respiratory Panel by RT PCR (Flu A&B, Covid) - Nasopharyngeal Swab     Status: None   Collection Time: 09/10/20 10:10 AM   Specimen: Nasopharyngeal Swab  Result Value Ref Range Status   SARS Coronavirus 2 by RT PCR NEGATIVE NEGATIVE Final    Comment: (NOTE) SARS-CoV-2 target nucleic acids are NOT DETECTED.  The SARS-CoV-2 RNA is generally detectable in upper respiratoy specimens during the acute phase of infection. The lowest concentration of SARS-CoV-2 viral copies this assay can detect is 131 copies/mL. A negative result does not preclude SARS-Cov-2 infection and should not be used as the sole basis for treatment or other patient management decisions. A negative result may occur with  improper specimen collection/handling,  submission of specimen other than nasopharyngeal swab, presence of viral mutation(s) within the areas targeted by this assay, and inadequate number of viral copies (<131 copies/mL). A negative result must be combined with clinical observations, patient history, and epidemiological information. The expected result is Negative.  Fact Sheet for Patients:  PinkCheek.be  Fact Sheet for Healthcare Providers:  GravelBags.it  This test is no t yet approved or cleared by the [place] FDA and  has been authorized for detection and/or diagnosis of SARS-CoV-2 by FDA under an Emergency Use Authorization (EUA). This EUA will remain  in effect (meaning this test can be used) for the duration of the COVID-19 declaration under Section 564(b)(1) of the Act, 21 U.S.C. section 360bbb-3(b)(1), unless the authorization is terminated or revoked sooner.     Influenza A by PCR NEGATIVE NEGATIVE Final   Influenza B by PCR NEGATIVE NEGATIVE Final  Comment: (NOTE) The Xpert Xpress SARS-CoV-2/FLU/RSV assay is intended as an aid in  the diagnosis of influenza from Nasopharyngeal swab specimens and  should not be used as a sole basis for treatment. Nasal washings and  aspirates are unacceptable for Xpert Xpress SARS-CoV-2/FLU/RSV  testing.  Fact Sheet for Patients: PinkCheek.be  Fact Sheet for Healthcare Providers: GravelBags.it  This test is not yet approved or cleared by the [place] FDA and  has been authorized for detection and/or diagnosis of SARS-CoV-2 by  FDA under an Emergency Use Authorization (EUA). This EUA will remain  in effect (meaning this test can be used) for the duration of the  Covid-19 declaration under Section 564(b)(1) of the Act, 21  U.S.C. section 360bbb-3(b)(1), unless the authorization is  terminated or revoked. Performed at [facility], [place] [address], [place], [place] [address]   Culture, blood (routine x 2)     Status: None (Preliminary result)   Collection Time: 09/10/20 11:45 AM   Specimen: BLOOD  Result Value Ref Range Status   Specimen Description BLOOD RIGHT ANTECUBITAL  Final   Special Requests   Final    BOTTLES DRAWN AEROBIC AND ANAEROBIC Blood Culture adequate volume   Culture   Final    NO GROWTH < 24 HOURS Performed at [facility], [place] [address] [place], [place] [address]    Report Status PENDING  Incomplete  Culture, blood (routine x 2)     Status: None (Preliminary result)   Collection Time: 09/10/20  2:14 PM   Specimen: BLOOD  Result Value Ref Range Status   Specimen Description BLOOD SITE NOT SPECIFIED  Final   Special Requests   Final    BOTTLES DRAWN AEROBIC AND ANAEROBIC Blood Culture adequate volume   Culture   Final    NO GROWTH < 24 HOURS Performed at [facility], [place] [address] [place], [place] [address]    Report Status PENDING  Incomplete  MRSA PCR Screening     Status: None   Collection Time: 09/10/20  5:13 PM   Specimen: Nasopharyngeal  Result Value Ref Range Status   MRSA by PCR NEGATIVE NEGATIVE Final    Comment:        The GeneXpert MRSA Assay (FDA approved for NASAL specimens only), is one component of a comprehensive MRSA colonization surveillance program. It is not intended to diagnose MRSA infection nor to guide or monitor treatment for MRSA infections. Performed at [facility], [place] [address], [place], [place] [address]   Gastrointestinal Panel by PCR , Stool     Status: None   Collection Time: 09/10/20  6:55 PM   Specimen: Stool  Result Value Ref Range Status   Campylobacter species NOT DETECTED NOT DETECTED Final   Plesimonas shigelloides NOT DETECTED NOT DETECTED Final   Salmonella species NOT DETECTED NOT DETECTED Final   Yersinia enterocolitica NOT DETECTED NOT DETECTED Final   Vibrio species NOT DETECTED NOT DETECTED Final   Vibrio cholerae  NOT DETECTED NOT DETECTED Final   Enteroaggregative E coli (EAEC) NOT DETECTED NOT DETECTED Final   Enteropathogenic E coli (EPEC) NOT DETECTED NOT DETECTED Final   Enterotoxigenic E coli (ETEC) NOT DETECTED NOT DETECTED Final   Shiga like toxin producing E coli (STEC) NOT DETECTED NOT DETECTED Final   Shigella/Enteroinvasive E coli (EIEC) NOT DETECTED NOT DETECTED Final   Cryptosporidium NOT DETECTED NOT DETECTED Final   Cyclospora cayetanensis NOT DETECTED NOT DETECTED Final  Entamoeba histolytica NOT DETECTED NOT DETECTED Final   Giardia lamblia NOT DETECTED NOT DETECTED Final   Adenovirus F40/41 NOT DETECTED NOT DETECTED Final   Astrovirus NOT DETECTED NOT DETECTED Final   Norovirus GI/GII NOT DETECTED NOT DETECTED Final   Rotavirus A NOT DETECTED NOT DETECTED Final   Sapovirus (I, II, IV, and V) NOT DETECTED NOT DETECTED Final    Comment: Performed at [facility], [address], [place], [place] [address]         Radiology Studies: DG Chest Portable 1 View  Result Date: 09/10/2020 CLINICAL DATA:  Weakness. EXAM: PORTABLE CHEST 1 VIEW COMPARISON:  August 04, 2020. FINDINGS: Stable cardiomediastinal silhouette. Right internal jugular dialysis catheter is unchanged in position. No pneumothorax is noted. Mild to moderate bilateral pleural effusions are noted with associated atelectasis. Bony thorax is unremarkable. IMPRESSION: Mild to moderate bilateral pleural effusions with associated atelectasis. Aortic Atherosclerosis (ICD10-I70.0). Electronically Signed   By: Marijo Conception M.D.   On: 09/10/2020 09:30   Scheduled Meds: . amLODipine  10 mg Oral Daily  . carvedilol  12.5 mg Oral BID WC  . Chlorhexidine Gluconate Cloth  6 each Topical Daily  . Chlorhexidine Gluconate Cloth  6 each Topical Q0600  . darbepoetin (ARANESP) injection - DIALYSIS  60 mcg Intravenous Q Wed-HD  . doxercalciferol  1 mcg Intravenous Q M,W,F-HD  . heparin  5,000 Units Subcutaneous Q8H  . mouth  rinse  15 mL Mouth Rinse BID  . rosuvastatin  10 mg Oral Daily  . sodium bicarbonate  1,300 mg Oral Once   Continuous Infusions:   LOS: 1 day    Time spent: 64min  Domenic Polite, MD [facility] 09/11/2020, 2:07 PM

## 2020-09-11 NOTE — Progress Notes (Signed)
[facility] Progress Note   Subjective:   Patient seen and examined at bedside.  Reports she is feeling a little better today.  Denies SOB, CP, n/v/d, abdominal pain, weakness and fatigue.  No acute events overnight.  Objective Vitals:   09/10/20 2105 09/11/20 0105 09/11/20 0528 09/11/20 0843  BP: (!) 161/75 (!) 158/71 (!) 178/72 (!) 182/64  Pulse: 91 88 94 97  Resp: 18 18 18 18   Temp: 98.2 F (36.8 C) 98.1 F (36.7 C) 98 F (36.7 C) 98 F (36.7 C)  TempSrc:    Oral  SpO2: 100% 97% 100% 98%  Weight:      Height:       Physical Exam General:WD obese female in NAD Heart:RRR Lungs:diminished in bases, upper lobes grossly clear Abdomen:soft, NTND Extremities:1+ edema from feet to hips Dialysis Access: TDC, LU AVF +thrill  Filed Weights   09/09/20 1440 09/10/20 1700  Weight: 63.5 kg 65 kg    Intake/Output Summary (Last 24 hours) at 09/11/2020 1311 Last data filed at 09/11/2020 1215 Gross per 24 hour  Intake 360 ml  Output 0 ml  Net 360 ml    Additional Objective Labs: Basic Metabolic Panel: Recent Labs  Lab 09/09/20 1442 09/09/20 1442 09/10/20 0753 09/10/20 1530 09/11/20 0206  NA 138  --  136  --  136  K 5.4*  --  5.4*  --  5.7*  CL 98  --  99  --  97*  CO2 27  --  22  --  22  GLUCOSE 108*  --  121*  --  87  BUN 34*  --  49*  --  56*  CREATININE 6.61*   < > 7.88* 8.31* 9.03*  CALCIUM 9.1  --  9.1  --  8.4*   < > = values in this interval not displayed.   Liver Function Tests: Recent Labs  Lab 09/11/20 0206  AST 29  ALT 14  ALKPHOS 55  BILITOT 1.1  PROT 4.3*  ALBUMIN 2.1*   Recent Labs  Lab 09/10/20 1530  LIPASE 44   CBC: Recent Labs  Lab 09/09/20 1442 09/10/20 1530 09/11/20 0206  WBC 19.5* 18.9* 13.8*  NEUTROABS  --  15.8*  --   HGB 13.2 11.7* 10.4*  HCT 41.4 36.6 31.7*  MCV 94.7 92.7 93.0  PLT 188 163 128*   Blood Culture    Component Value Date/Time   SDES BLOOD SITE NOT SPECIFIED 09/10/2020 1414   SPECREQUEST   09/10/2020 1414    BOTTLES DRAWN AEROBIC AND ANAEROBIC Blood Culture adequate volume   CULT  09/10/2020 1414    NO GROWTH < 24 HOURS Performed at [facility], [place] [address] [place], [place] [address]    REPTSTATUS PENDING 09/10/2020 1414   Studies/Results: DG Chest Portable 1 View  Result Date: 09/10/2020 CLINICAL DATA:  Weakness. EXAM: PORTABLE CHEST 1 VIEW COMPARISON:  August 04, 2020. FINDINGS: Stable cardiomediastinal silhouette. Right internal jugular dialysis catheter is unchanged in position. No pneumothorax is noted. Mild to moderate bilateral pleural effusions are noted with associated atelectasis. Bony thorax is unremarkable. IMPRESSION: Mild to moderate bilateral pleural effusions with associated atelectasis. Aortic Atherosclerosis (ICD10-I70.0). Electronically Signed   By: Marijo Conception M.D.   On: 09/10/2020 09:30    Medications:  . amLODipine  10 mg Oral Daily  . carvedilol  12.5 mg Oral BID WC  . Chlorhexidine Gluconate Cloth  6 each Topical Daily  . Chlorhexidine Gluconate Cloth  6  each Topical [id number]  . doxercalciferol  1 mcg Intravenous Q M,W,F-HD  . heparin  5,000 Units Subcutaneous Q8H  . mouth rinse  15 mL Mouth Rinse BID  . rosuvastatin  10 mg Oral Daily  . sodium bicarbonate  1,300 mg Oral Once    Dialysis Orders: MWF - [facility]  4hrs, BFR 350, DFR 800,  EDW 66.6kg, 3K/ 2.5Ca  Access: R IJ TDC, LU AVF +thrill  Heparin none Mircera 60 mcg q2wks - last 08/28/20 Hectorol 31mcg IV qHD   Assessment/Plan:  1. Volume Overload - 2/2 missed HD and weight loss.  Has been getting under EDW, working to establish correct EDW.  Plan for HD today per regular schedule with net UF 4L. Dry weight: 66.6kg, last dialysis left at 64.5kg - will need lower EDW on d/c.  2. Hypertensive Urgency - BP elevated, Continue home medications: Amlodipine and Carvedilol. Hydralazine as needed. Expect improvement with volume removal.  3. Hyperkalemia - likely 2/2 missed HD. K5.4 on  admission, given 1 dose of lokelma.  Increased to 5.7 today. Plan for HD today. 4. Dialysis dependent AKI -  Likely progressed to ESRD.  On HD MWF.  Plan for HD today per regular schedule.  5.  Anemia of CKD - Hgb 10.4. ESA due today, will order.   6.  Secondary Hyperparathyroidism -  Last pth 213. Ca in goal. Will check phos - in goal as OP. Not on binders.  Continue hectorol.  7.  Nutrition - Renal diet w/fluid restrictions 8. COPD/chronic hypoxemic respiratory failure on home O2 - on baseline O2 - 2L  Jen Mow, PA-C [facility] 09/11/2020,1:11 PM  LOS: 1 day

## 2020-09-12 DIAGNOSIS — R197 Diarrhea, unspecified: Secondary | ICD-10-CM | POA: Diagnosis not present

## 2020-09-12 DIAGNOSIS — E875 Hyperkalemia: Secondary | ICD-10-CM

## 2020-09-12 DIAGNOSIS — R112 Nausea with vomiting, unspecified: Secondary | ICD-10-CM | POA: Diagnosis not present

## 2020-09-12 DIAGNOSIS — N186 End stage renal disease: Secondary | ICD-10-CM | POA: Diagnosis not present

## 2020-09-12 LAB — CBC
HCT: 32.3 % — ABNORMAL LOW (ref 36.0–46.0)
Hemoglobin: 10.7 g/dL — ABNORMAL LOW (ref 12.0–15.0)
MCH: 30.4 pg (ref 26.0–34.0)
MCHC: 33.1 g/dL (ref 30.0–36.0)
MCV: 91.8 fL (ref 80.0–100.0)
Platelets: 157 10*3/uL (ref 150–400)
RBC: 3.52 MIL/uL — ABNORMAL LOW (ref 3.87–5.11)
RDW: 14.4 % (ref 11.5–15.5)
WBC: 14.2 10*3/uL — ABNORMAL HIGH (ref 4.0–10.5)
nRBC: 0 % (ref 0.0–0.2)

## 2020-09-12 LAB — BASIC METABOLIC PANEL
Anion gap: 12 (ref 5–15)
BUN: 21 mg/dL (ref 8–23)
CO2: 26 mmol/L (ref 22–32)
Calcium: 8.4 mg/dL — ABNORMAL LOW (ref 8.9–10.3)
Chloride: 99 mmol/L (ref 98–111)
Creatinine, Ser: 5.18 mg/dL — ABNORMAL HIGH (ref 0.44–1.00)
GFR calc Af Amer: 9 mL/min — ABNORMAL LOW (ref >60)
GFR calc non Af Amer: 7 mL/min — ABNORMAL LOW (ref >60)
Glucose, Bld: 116 mg/dL — ABNORMAL HIGH (ref 70–99)
Potassium: 4 mmol/L (ref 3.5–5.1)
Sodium: 137 mmol/L (ref 135–145)

## 2020-09-12 LAB — HEPATITIS B SURFACE ANTIGEN: Hepatitis B Surface Ag: NONREACTIVE

## 2020-09-12 MED ORDER — SENNOSIDES-DOCUSATE SODIUM 8.6-50 MG PO TABS: 1.0000 | ORAL_TABLET | Freq: Every day | ORAL | Status: AC | PRN

## 2020-09-12 MED ORDER — POLYETHYLENE GLYCOL 3350 17 G PO PACK: 17.0000 g | PACK | Freq: Every day | ORAL | Status: AC | PRN

## 2020-09-12 NOTE — Progress Notes (Signed)
DISCHARGE NOTE HOME GERALDINE SANDBERG to be discharged Home per MD order. Discussed prescriptions and follow up appointments with the patient. Prescriptions given to patient; medication list explained in detail. Patient verbalized understanding.  Skin clean, dry and intact without evidence of skin break down, no evidence of skin tears noted. IV catheter discontinued intact. Site without signs and symptoms of complications. Dressing and pressure applied. Pt denies pain at the site currently. No complaints noted.  Patient free of lines, drains, and wounds.   An After Visit Summary (AVS) was printed and given to the patient. Patient escorted via wheelchair, and discharged home via private auto.  Arlyss Repress, RN

## 2020-09-12 NOTE — Discharge Summary (Signed)
Physician Discharge Summary  Cassandra Dyer YFV:[id number] DOB: 01/20/1941 DOA: 09/09/2020  PCP: Biagio Borg, MD  Admit date: 09/09/2020 Discharge date: 09/12/2020  Time spent: 35 minutes  Recommendations for Outpatient Follow-up:  PCP in 1 week  Discharge Diagnoses:  Viral gastroenteritis Fluid overload Hyperkalemia Bilateral pleural effusions Hypertensive urgency Chronic respiratory failure/COPD   Essential hypertension   Hypertensive urgency   Hyperkalemia   Hyperlipidemia   ESRD (end stage renal disease) (HCC)   Volume overload   Nausea vomiting and diarrhea   Discharge Condition: Improved  Diet recommendation: Renal  Filed Weights   09/10/20 1700 09/11/20 2018 09/11/20 2350  Weight: 65 kg 66.3 kg 63 kg    History of present illness:  79 year old female with history of renal cell carcinoma status post right nephrectomy in 2018, ESRD started hemodialysis in 8/21, COPD, chronic respiratory failure on 2 L home O2 hepatitis C, hypertension, presented to the emergency room with nausea vomiting diarrhea and generalized weakness. -She also missed hemodialysis treatment -In the emergency room she was noted to be hypertensive, tachycardic, work-up noted signs of fluid overload with bilateral pleural effusions  Hospital Course:   Nausea vomiting and diarrhea -Suspect viral gastroenteritis, reportedly started after drinking soup on Thursday -Covid and GI pathogen panel were negative -Improved with supportive care, no further vomiting or diarrhea, tolerating renal soft diet at the time of discharge  Hyperkalemia Fluid overload Bilateral pleural effusions ESRD on hemodialysis since 8/21 -Missed HD x1 -Nephrology consulted, improved with extra hemodialysis -Importance of compliance with hemodialysis and renal diet emphasized  Hypertensive urgency -Due to fluid overloaded state -Improving with HD, continue Coreg and amlodipine  COPD/chronic respiratory failure -On  2 L home O2 at baseline -No wheezing  Leukocytosis -Likely reactive, improving  Consultations:  Nephrology  Discharge Exam: Vitals:   09/12/20 0457 09/12/20 0904  BP: (!) 171/60 (!) 198/79  Pulse: 99 96  Resp: 18 18  Temp: 98 F (36.7 C) 97.7 F (36.5 C)  SpO2: 97%     General: Awake alert oriented x3 Cardiovascular: S1-S2, regular rate rhythm Respiratory: Clear  Discharge Instructions   Discharge Instructions    AMB Referral to [facility]   Complete by: As directed    Please refer to telephonic RN for post hospital follow up. Patient has dialysis on M,W,F schedule.  [name], MSN, RN [facility] Liaison Nurse Mobile Phone [phone]  Toll free office [phone]   Reason for consult: Post hospital follow up   Diagnoses of:  Kidney Failure Other     Other Diagnosis: hypertension   Expected date of contact: 1-3 days (reserved for hospital discharges)   Discharge instructions   Complete by: As directed    Renal Diet   Increase activity slowly   Complete by: As directed      Allergies as of 09/12/2020   No Known Allergies     Medication List    STOP taking these medications   hydrALAZINE 100 MG tablet Commonly known as: APRESOLINE     TAKE these medications   amLODipine 10 MG tablet Commonly known as: NORVASC Take 1 tablet (10 mg total) by mouth daily.   benzonatate 200 MG capsule Commonly known as: TESSALON Take 1 capsule (200 mg total) by mouth 3 (three) times daily as needed. What changed: reasons to take this   Black Currant Seed Oil 500 MG Caps Take 500 mg by mouth daily.   CALCIUM 1200 PO Take 1,200 mg by mouth daily.  carvedilol 12.5 MG tablet Commonly known as: COREG Take 1 tablet (12.5 mg total) by mouth 2 (two) times daily with a meal.   FISH OIL PO Take 1 tablet by mouth daily.   Garlic 6384 MG Caps Take 2,000 mg by mouth daily.   multivitamin with minerals Tabs tablet Take 1 tablet by  mouth daily.   ondansetron 4 MG tablet Commonly known as: Zofran Take 1 tablet (4 mg total) by mouth every 8 (eight) hours as needed for nausea or vomiting.   OVER THE COUNTER MEDICATION Take 1 capsule by mouth daily. Mega juice   polyethylene glycol 17 g packet Commonly known as: MIRALAX / GLYCOLAX Take 17 g by mouth daily as needed for mild constipation.   rosuvastatin 10 MG tablet Commonly known as: CRESTOR Take 1 tablet by mouth once daily   senna-docusate 8.6-50 MG tablet Commonly known as: Senokot-S Take 1 tablet by mouth daily as needed for mild constipation.   VITAMIN C PO Take 500 mg by mouth in the morning and at bedtime.   VITAMIN D PO Take 2,000 mcg by mouth See admin instructions. Takes 2000 mcg 3 times a week ( monday, wednesday and friday)      No Known Allergies    The results of significant diagnostics from this hospitalization (including imaging, microbiology, ancillary and laboratory) are listed below for reference.    Significant Diagnostic Studies: DG Chest Portable 1 View  Result Date: 09/10/2020 CLINICAL DATA:  Weakness. EXAM: PORTABLE CHEST 1 VIEW COMPARISON:  August 04, 2020. FINDINGS: Stable cardiomediastinal silhouette. Right internal jugular dialysis catheter is unchanged in position. No pneumothorax is noted. Mild to moderate bilateral pleural effusions are noted with associated atelectasis. Bony thorax is unremarkable. IMPRESSION: Mild to moderate bilateral pleural effusions with associated atelectasis. Aortic Atherosclerosis (ICD10-I70.0). Electronically Signed   By: Marijo Conception M.D.   On: 09/10/2020 09:30    Microbiology: Recent Results (from the past 240 hour(s))  Respiratory Panel by RT PCR (Flu A&B, Covid) - Nasopharyngeal Swab     Status: None   Collection Time: 09/10/20 10:10 AM   Specimen: Nasopharyngeal Swab  Result Value Ref Range Status   SARS Coronavirus 2 by RT PCR NEGATIVE NEGATIVE Final    Comment: (NOTE) SARS-CoV-2  target nucleic acids are NOT DETECTED.  The SARS-CoV-2 RNA is generally detectable in upper respiratoy specimens during the acute phase of infection. The lowest concentration of SARS-CoV-2 viral copies this assay can detect is 131 copies/mL. A negative result does not preclude SARS-Cov-2 infection and should not be used as the sole basis for treatment or other patient management decisions. A negative result may occur with  improper specimen collection/handling, submission of specimen other than nasopharyngeal swab, presence of viral mutation(s) within the areas targeted by this assay, and inadequate number of viral copies (<131 copies/mL). A negative result must be combined with clinical observations, patient history, and epidemiological information. The expected result is Negative.  Fact Sheet for Patients:  PinkCheek.be  Fact Sheet for Healthcare Providers:  GravelBags.it  This test is no t yet approved or cleared by the [place] FDA and  has been authorized for detection and/or diagnosis of SARS-CoV-2 by FDA under an Emergency Use Authorization (EUA). This EUA will remain  in effect (meaning this test can be used) for the duration of the COVID-19 declaration under Section 564(b)(1) of the Act, 21 U.S.C. section 360bbb-3(b)(1), unless the authorization is terminated or revoked sooner.     Influenza A by PCR  NEGATIVE NEGATIVE Final   Influenza B by PCR NEGATIVE NEGATIVE Final    Comment: (NOTE) The Xpert Xpress SARS-CoV-2/FLU/RSV assay is intended as an aid in  the diagnosis of influenza from Nasopharyngeal swab specimens and  should not be used as a sole basis for treatment. Nasal washings and  aspirates are unacceptable for Xpert Xpress SARS-CoV-2/FLU/RSV  testing.  Fact Sheet for Patients: PinkCheek.be  Fact Sheet for Healthcare  Providers: GravelBags.it  This test is not yet approved or cleared by the [place] FDA and  has been authorized for detection and/or diagnosis of SARS-CoV-2 by  FDA under an Emergency Use Authorization (EUA). This EUA will remain  in effect (meaning this test can be used) for the duration of the  Covid-19 declaration under Section 564(b)(1) of the Act, 21  U.S.C. section 360bbb-3(b)(1), unless the authorization is  terminated or revoked. Performed at [facility], [place] [address], [place], [place] [address]   Culture, blood (routine x 2)     Status: None (Preliminary result)   Collection Time: 09/10/20 11:45 AM   Specimen: BLOOD  Result Value Ref Range Status   Specimen Description BLOOD RIGHT ANTECUBITAL  Final   Special Requests   Final    BOTTLES DRAWN AEROBIC AND ANAEROBIC Blood Culture adequate volume   Culture   Final    NO GROWTH 2 DAYS Performed at [facility], [place] [address], [place], [place] [address]    Report Status PENDING  Incomplete  Culture, blood (routine x 2)     Status: None (Preliminary result)   Collection Time: 09/10/20  2:14 PM   Specimen: BLOOD  Result Value Ref Range Status   Specimen Description BLOOD SITE NOT SPECIFIED  Final   Special Requests   Final    BOTTLES DRAWN AEROBIC AND ANAEROBIC Blood Culture adequate volume   Culture   Final    NO GROWTH 2 DAYS Performed at [facility], [place] [address] [place], [place] [address]    Report Status PENDING  Incomplete  MRSA PCR Screening     Status: None   Collection Time: 09/10/20  5:13 PM   Specimen: Nasopharyngeal  Result Value Ref Range Status   MRSA by PCR NEGATIVE NEGATIVE Final    Comment:        The GeneXpert MRSA Assay (FDA approved for NASAL specimens only), is one component of a comprehensive MRSA colonization surveillance program. It is not intended to diagnose MRSA infection nor to guide or monitor treatment for MRSA  infections. Performed at [facility], [place] [address] [place],  [address]   Gastrointestinal Panel by PCR , Stool     Status: None   Collection Time: 09/10/20  6:55 PM   Specimen: Stool  Result Value Ref Range Status   Campylobacter species NOT DETECTED NOT DETECTED Final   Plesimonas shigelloides NOT DETECTED NOT DETECTED Final   Salmonella species NOT DETECTED NOT DETECTED Final   Yersinia enterocolitica NOT DETECTED NOT DETECTED Final   Vibrio species NOT DETECTED NOT DETECTED Final   Vibrio cholerae NOT DETECTED NOT DETECTED Final   Enteroaggregative E coli (EAEC) NOT DETECTED NOT DETECTED Final   Enteropathogenic E coli (EPEC) NOT DETECTED NOT DETECTED Final   Enterotoxigenic E coli (ETEC) NOT DETECTED NOT DETECTED Final   Shiga like toxin producing E coli (STEC) NOT DETECTED NOT DETECTED Final   Shigella/Enteroinvasive E coli (EIEC) NOT DETECTED NOT DETECTED Final   Cryptosporidium NOT DETECTED NOT  DETECTED Final   Cyclospora cayetanensis NOT DETECTED NOT DETECTED Final   Entamoeba histolytica NOT DETECTED NOT DETECTED Final   Giardia lamblia NOT DETECTED NOT DETECTED Final   Adenovirus F40/41 NOT DETECTED NOT DETECTED Final   Astrovirus NOT DETECTED NOT DETECTED Final   Norovirus GI/GII NOT DETECTED NOT DETECTED Final   Rotavirus A NOT DETECTED NOT DETECTED Final   Sapovirus (I, II, IV, and V) NOT DETECTED NOT DETECTED Final    Comment: Performed at [facility], [address] [place], [place] [address]     Labs: Basic Metabolic Panel: Recent Labs  Lab 09/09/20 1442 09/10/20 0753 09/10/20 1530 09/11/20 0206 09/12/20 0445  NA 138 136  --  136 137  K 5.4* 5.4*  --  5.7* 4.0  CL 98 99  --  97* 99  CO2 27 22  --  22 26  GLUCOSE 108* 121*  --  87 116*  BUN 34* 49*  --  56* 21  CREATININE 6.61* 7.88* 8.31* 9.03* 5.18*  CALCIUM 9.1 9.1  --  8.4* 8.4*  MG  --   --  2.1  --   --    Liver Function Tests: Recent Labs  Lab 09/11/20 0206  AST  29  ALT 14  ALKPHOS 55  BILITOT 1.1  PROT 4.3*  ALBUMIN 2.1*   Recent Labs  Lab 09/10/20 1530  LIPASE 44   No results for input(s): AMMONIA in the last 168 hours. CBC: Recent Labs  Lab 09/09/20 1442 09/10/20 1530 09/11/20 0206 09/12/20 0445  WBC 19.5* 18.9* 13.8* 14.2*  NEUTROABS  --  15.8*  --   --   HGB 13.2 11.7* 10.4* 10.7*  HCT 41.4 36.6 31.7* 32.3*  MCV 94.7 92.7 93.0 91.8  PLT 188 163 128* 157   Cardiac Enzymes: No results for input(s): CKTOTAL, CKMB, CKMBINDEX, TROPONINI in the last 168 hours. BNP: BNP (last 3 results) No results for input(s): BNP in the last 8760 hours.  ProBNP (last 3 results) No results for input(s): PROBNP in the last 8760 hours.  CBG: No results for input(s): GLUCAP in the last 168 hours.     Signed:  Domenic Polite MD.  [facility] 09/12/2020, 3:31 PM

## 2020-09-12 NOTE — Care Management Important Message (Signed)
Important Message  Patient Details  Name: Cassandra Dyer MRN: [id number] Date of Birth: [birth year]   Medicare Important Message Given:  Yes - Important Message mailed due to current National Emergency  Verbal consent obtained due to current National Emergency  Relationship to patient: Self Contact Name: Rocio Roam Call Date: 09/12/20  Time: [place] Phone: [phone] Outcome: Spoke with contact Important Message mailed to: Patient address on file    [name] 09/12/2020, 9:42 AM

## 2020-09-12 NOTE — Progress Notes (Signed)
AVS explained to the patient, verbalized understanding.  Removed peripheral IV, site clean and dry.  She is waiting for her sister to be discharged.

## 2020-09-12 NOTE — Progress Notes (Signed)
[facility] Progress Note   Subjective:   Patient seen and examined at bedside in room.  Reports she is feeling well.  States she is not SOB "but I haven't gotten up and moved around yet."  Denies CP, orthopnea, n/v/d, weakness, dizziness and fatigue.  Tolerated dialysis well yesterday.  Admits to ankle edema.     Objective Vitals:   09/12/20 0057 09/12/20 0228 09/12/20 0457 09/12/20 0904  BP: (!) 198/90 (!) 163/63 (!) 171/60 (!) 198/79  Pulse: (!) 101 99 99 96  Resp: 17  18 18   Temp: 98 F (36.7 C)  98 F (36.7 C) 97.7 F (36.5 C)  TempSrc: Oral   Oral  SpO2: 99%  97%   Weight:      Height:       Physical Exam General:WD, obese female in NAD Heart:RRR Lungs:BD diminished in bases, +egophony, upper lobes grossly clear, no crackles, nml WOB on 2L via [place] Abdomen:soft, NTND Extremities:1+ edema from hips to feet Dialysis Access: TDC, LU AVF +thrill   Filed Weights   09/10/20 1700 09/11/20 2018 09/11/20 2350  Weight: 65 kg 66.3 kg 63 kg    Intake/Output Summary (Last 24 hours) at 09/12/2020 1107 Last data filed at 09/12/2020 0900 Gross per 24 hour  Intake 1000 ml  Output 3500 ml  Net -2500 ml    Additional Objective Labs: Basic Metabolic Panel: Recent Labs  Lab 09/10/20 0753 09/10/20 0753 09/10/20 1530 09/11/20 0206 09/12/20 0445  NA 136  --   --  136 137  K 5.4*  --   --  5.7* 4.0  CL 99  --   --  97* 99  CO2 22  --   --  22 26  GLUCOSE 121*  --   --  87 116*  BUN 49*  --   --  56* 21  CREATININE 7.88*   < > 8.31* 9.03* 5.18*  CALCIUM 9.1  --   --  8.4* 8.4*   < > = values in this interval not displayed.   Liver Function Tests: Recent Labs  Lab 09/11/20 0206  AST 29  ALT 14  ALKPHOS 55  BILITOT 1.1  PROT 4.3*  ALBUMIN 2.1*   Recent Labs  Lab 09/10/20 1530  LIPASE 44   CBC: Recent Labs  Lab 09/09/20 1442 09/09/20 1442 09/10/20 1530 09/11/20 0206 09/12/20 0445  WBC 19.5*   < > 18.9* 13.8* 14.2*  NEUTROABS  --   --  15.8*   --   --   HGB 13.2   < > 11.7* 10.4* 10.7*  HCT 41.4   < > 36.6 31.7* 32.3*  MCV 94.7  --  92.7 93.0 91.8  PLT 188   < > 163 128* 157   < > = values in this interval not displayed.   Blood Culture    Component Value Date/Time   SDES BLOOD SITE NOT SPECIFIED 09/10/2020 1414   SPECREQUEST  09/10/2020 1414    BOTTLES DRAWN AEROBIC AND ANAEROBIC Blood Culture adequate volume   CULT  09/10/2020 1414    NO GROWTH 2 DAYS Performed at [facility], [place] [address], [place], [place] [address]    REPTSTATUS PENDING 09/10/2020 1414    Medications:  . amLODipine  10 mg Oral Daily  . carvedilol  12.5 mg Oral BID WC  . Chlorhexidine Gluconate Cloth  6 each Topical Daily  . Chlorhexidine Gluconate Cloth  6 each Topical Q0600  . darbepoetin (ARANESP) injection -  DIALYSIS  60 mcg Intravenous Q Wed-HD  . doxercalciferol  1 mcg Intravenous Q M,W,F-HD  . heparin  5,000 Units Subcutaneous Q8H  . mouth rinse  15 mL Mouth Rinse BID  . rosuvastatin  10 mg Oral Daily    Dialysis Orders: MWF - [facility] 4hrs, BFR 350, DFR 800, EDW 66.6kg, 3K/ 2.5Ca  Access: R IJ TDC, LU AVF +thrill  Heparin none Mircera 60 mcg q2wks - last 08/28/20 Hectorol 26mcg IV qHD   Assessment/Plan: 1. Volume Overload/bilateral pleural effusions - 2/2 missed HD and weight loss, has been getting under EDW at HD.  Continue to titrate down volume with dialysis, can be done at OP HD.  Will need lower EDW on d/c.  Post weight yesterday 63kg, net UF 3.5L removed. 2. Hypertensive Urgency - BP remains elevated this AM prior to receiving meds, on amlodipine 10mg  and carvedilol 12.5mg  BID,  Hydralazine as needed. May need to increase carvedilol if not improved. Expect continued improvement with volume reduction.  3. Hyperkalemia - Resolved with HD. 2/2 missed HD. K 4.0 today. 4. Dialysis dependent AKI- Likely progressed to ESRD. On HD MWF. Next HD tomorrow. Can be completed as OP if d/c. 5. Anemiaof CKD- Hgb 10.7. ESA  not given with HD yesterday, can resume at OP dialysis.  6. Secondary Hyperparathyroidism - Last pth 213. Ca in goal. Will check phos - in goal as OP. Not on binders. Continue hectorol.  7. Nutrition- Renal diet w/fluid restrictions 8. COPD/chronic hypoxemic respiratory failure on home O2 - on baseline O2 - 2L 9. Dispo - ok to d/c from renal standpoint, continue to optimize volume at dialysis  Jen Mow, PA-C [place] 09/12/2020,11:07 AM  LOS: 2 days

## 2020-09-12 NOTE — Plan of Care (Signed)
  Problem: Activity: Goal: Risk for activity intolerance will decrease Outcome: Progressing   

## 2020-09-12 NOTE — Consult Note (Signed)
   [facility] Inpatient Consult   09/12/2020  Cassandra Dyer 1941/01/16 [id number]   [facility] Follow up:  Spoke with patient at bedside reviewing [facility] CM services, as patient had been contacted by community RN care manager in May 2021. HIPAA verified. Patient verbalizes understanding of services and verbally agrees to post hospital follow up for community care management and chronic disease management needs. [facility] CM brochure provided.  Patient denies needs for assistance with medications, meals, or transportation.   Referral placed for [facility] CM RN community follow up. Of note, [facility] Care Management services does not replace or interfere with any services that are arranged by inpatient case management or social work.  [name], MSN, [facility] Liaison Nurse Mobile Phone [phone]  Toll free office [phone]

## 2020-09-13 ENCOUNTER — Telehealth: Payer: Self-pay

## 2020-09-13 ENCOUNTER — Encounter: Payer: Self-pay | Admitting: *Deleted

## 2020-09-13 ENCOUNTER — Other Ambulatory Visit: Payer: Self-pay | Admitting: *Deleted

## 2020-09-13 DIAGNOSIS — N179 Acute kidney failure, unspecified: Secondary | ICD-10-CM | POA: Diagnosis not present

## 2020-09-13 DIAGNOSIS — Z992 Dependence on renal dialysis: Secondary | ICD-10-CM | POA: Diagnosis not present

## 2020-09-13 DIAGNOSIS — N2581 Secondary hyperparathyroidism of renal origin: Secondary | ICD-10-CM | POA: Diagnosis not present

## 2020-09-13 DIAGNOSIS — D631 Anemia in chronic kidney disease: Secondary | ICD-10-CM | POA: Diagnosis not present

## 2020-09-13 DIAGNOSIS — D509 Iron deficiency anemia, unspecified: Secondary | ICD-10-CM | POA: Diagnosis not present

## 2020-09-13 LAB — HEPATITIS B SURFACE ANTIBODY, QUANTITATIVE: Hep B S AB Quant (Post): 52.1 m[IU]/mL (ref >9.9)

## 2020-09-13 NOTE — Telephone Encounter (Signed)
Transition Care Management Unsuccessful Follow-up Telephone Call  Date of discharge and from where:  09/12/2020 from [facility]  Attempts:  1st Attempt  Reason for unsuccessful TCM follow-up call:  Left voice message

## 2020-09-13 NOTE — Patient Outreach (Signed)
[facility]) Care Management [facility] CM Telephone Outreach, new referral PCP office completes Transition of Care follow up post-hospital discharge Post-hospital discharge day # 1 Unsuccessful outreach attempt # 1- new patient  09/13/2020  Cassandra Dyer 1941/02/01 [id number]  Unsuccessful initial outreach attempt to [name], 79 y/o female referred to [facility] RN CM 09/12/20 by [facility] Liaison after patient experienced hospitalization September 27-30, 2021 for N/V/D with malaise and weakness; patient had bilateral pleural effusions and was diagnosed with gastroenteritis.  She had missed a hemodialysis session upon becoming sick at home.  Patient has history including, but not limited to, renal cell cancer with (R) nephrectomy 2018; ESRD- on hemodialysis since 07/2020; COPD- on home O2 at baseline at 2 L/min; HTN/ HLD.  HIPAA compliant voice mail message left for patient, requesting return call back.  Plan:  Will place [facility] CM unsuccessful patient outreach letter in mail requesting call back in writing  Will re-attempt [facility] CM telephone outreach within 4 business days if I do not hear back from patient first  [name], RN, BSN, [employer] Coordinator [facility] Care Management  [phone]

## 2020-09-15 LAB — CULTURE, BLOOD (ROUTINE X 2)
Culture: NO GROWTH
Culture: NO GROWTH
Special Requests: ADEQUATE
Special Requests: ADEQUATE

## 2020-09-16 ENCOUNTER — Other Ambulatory Visit: Payer: Self-pay | Admitting: *Deleted

## 2020-09-16 ENCOUNTER — Encounter: Payer: Self-pay | Admitting: *Deleted

## 2020-09-16 DIAGNOSIS — Z992 Dependence on renal dialysis: Secondary | ICD-10-CM | POA: Diagnosis not present

## 2020-09-16 DIAGNOSIS — D631 Anemia in chronic kidney disease: Secondary | ICD-10-CM | POA: Diagnosis not present

## 2020-09-16 DIAGNOSIS — N179 Acute kidney failure, unspecified: Secondary | ICD-10-CM | POA: Diagnosis not present

## 2020-09-16 DIAGNOSIS — D509 Iron deficiency anemia, unspecified: Secondary | ICD-10-CM | POA: Diagnosis not present

## 2020-09-16 DIAGNOSIS — N2581 Secondary hyperparathyroidism of renal origin: Secondary | ICD-10-CM | POA: Diagnosis not present

## 2020-09-16 NOTE — Patient Outreach (Signed)
[facility]) Care Management [facility] CM Telephone Outreach, new referral + EMMI Red-Alert notification PCP office completes Transition of Care outreach post-hospital discharge Post-hospital discharge day # 4 Unsuccessful (consecutive) outreach attempt # 2  09/16/2020  [name] 27-Aug-1941 [id number]  EMMI Red-Alert notification/ General Discharge EMMI call date/ day #: Saturday 09/14/20; day # 1 Red-Alert reason(s): "does not know who to call for changes in condition"  Unsuccessful (consecutive) second outreach attempt to Cassandra Dyer, 79 y/o female referred to [facility] RN CM 09/12/20 by [facility] Liaison after patient experienced hospitalization September 27-30, 2021 for N/V/D with malaise and weakness; patient had bilateral pleural effusions and was diagnosed with gastroenteritis.  She had missed a hemodialysis session upon becoming sick at home.  Patient has history including, but not limited to, renal cell cancer with (R) nephrectomy 2018; ESRD- on hemodialysis since 07/2020; COPD- on home O2 at baseline at 2 L/min; HTN/ HLD.  Received additional referral 09/16/20 to contact patient for EMMI red-alert notification as above.  HIPAA compliant voice mail message left for patient, requesting return call back.  Plan:  Verified [facility] CM unsuccessful patient outreach letter placed in mail requesting call back in writing on 09/13/20  Will re-attempt [facility] CM telephone outreach within 4 business days if I do not hear back from patient first  [name], RN, BSN, [employer] Coordinator [facility] Care Management  [phone]

## 2020-09-17 ENCOUNTER — Telehealth: Payer: Self-pay | Admitting: Internal Medicine

## 2020-09-17 NOTE — Telephone Encounter (Cosign Needed)
Transition Care Management Follow-up Telephone Call  Date of discharge and from where: 09/12/2020 from [facility]  How have you been since you were released from the hospital? Doing good but no appetite. Drinking Ensures.  Any questions or concerns? No  Items Reviewed:  Did the pt receive and understand the discharge instructions provided? Yes   Medications obtained and verified? Yes   Any new allergies since your discharge? No   Dietary orders reviewed? Yes (Renal Diet) Dialysis on Mon/Wed/Fri  Do you have support at home? Yes  significant other  Functional Questionnaire: (I = Independent and D = Dependent) ADLs: I  Bathing/Dressing- I  Meal Prep- D  Eating- I  Maintaining continence- I  Transferring/Ambulation- I  Managing Meds- I  Follow up appointments reviewed:   PCP Hospital f/u appt confirmed? Yes  Scheduled to see Cathlean Cower on in 1 week per hospital discharge; awaiting approval to schedule/overbook.  [facility] f/u appt confirmed? No    Are transportation arrangements needed? No   If their condition worsens, is the pt aware to call PCP or go to the Emergency Dept.? Yes  Was the patient provided with contact information for the PCP's office or ED? Yes  Was to pt encouraged to call back with questions or concerns? Yes

## 2020-09-17 NOTE — Telephone Encounter (Signed)
Patient called and was returning a phone call. She can be reached at [phone].

## 2020-09-18 ENCOUNTER — Encounter (HOSPITAL_COMMUNITY): Payer: Medicare Other

## 2020-09-18 ENCOUNTER — Telehealth: Payer: Self-pay

## 2020-09-18 DIAGNOSIS — D509 Iron deficiency anemia, unspecified: Secondary | ICD-10-CM | POA: Diagnosis not present

## 2020-09-18 DIAGNOSIS — D631 Anemia in chronic kidney disease: Secondary | ICD-10-CM | POA: Diagnosis not present

## 2020-09-18 DIAGNOSIS — N179 Acute kidney failure, unspecified: Secondary | ICD-10-CM | POA: Diagnosis not present

## 2020-09-18 DIAGNOSIS — Z992 Dependence on renal dialysis: Secondary | ICD-10-CM | POA: Diagnosis not present

## 2020-09-18 DIAGNOSIS — N2581 Secondary hyperparathyroidism of renal origin: Secondary | ICD-10-CM | POA: Diagnosis not present

## 2020-09-18 NOTE — Telephone Encounter (Signed)
TCM call and patient has been scheduled for hospital f/u on 09/26/2020 at 2:00pm.

## 2020-09-19 ENCOUNTER — Other Ambulatory Visit: Payer: Self-pay | Admitting: *Deleted

## 2020-09-19 ENCOUNTER — Encounter: Payer: Self-pay | Admitting: *Deleted

## 2020-09-19 NOTE — Patient Outreach (Signed)
[facility]) Care Management THN CM Telephone Outreach, new referral PCP office completes Transition of Care follow up post-hospital discharge Post-hospital discharge day # 7  09/19/2020  Cassandra Dyer 03-Jul-1941 [id number]  EMMI Red-Alert notification/ General Discharge EMMI call date/ day #: Saturday 09/14/20; day # 1 Red-Alert reason(s): "does not know who to call for changes in condition"  Successful (consecutive) third outreach attempt to Cassandra Dyer, 79 y/o female referred to [place] 09/12/20 by [facility] Liaison after patient experienced hospitalization September 27-30, 2021 for N/V/D with malaise and weakness; patient had bilateral pleural effusions and was diagnosed with gastroenteritis. She had missed a hemodialysis session upon becoming sick at home. Patient has history including, but not limited to, renal cell cancer with (R) nephrectomy 2018; ESRD- on hemodialysis since 07/2020; COPD- on home O2 at baseline at 2 L/min; HTN/ HLD.  Received additional referral 09/16/20 to contact patient for EMMI red-alert notification as above.  HIPAA/ identity verified; purpose of call and [facility] CM services discussed with patient who provides verbal consent for [facility] CM involvement in her care post-recent hospital discharge.    Patient reports doing well; states she is just waking up this morning.  Confirms that she has no clinical concerns; reports that she understands to contact her care providers for any new concerns or changes; confirms she has phone numbers for all care providers.  Reports has not missed any hemodialysis sessions since being home.  Declines post-hospital discharge medication review today, but denies medications concerns/ problems; reports self-manages medications using pill box.  Agreeable to medication review at time of next outreach.  Admits that it is often difficult for her to manage so many doctor's appointments/ phone calls around her hemodialysis  sessions.  Reports tolerating hemodialysis well over last few months since it was initially initiated, although she is still learning about hemodialysis.  Confirms that she has spoken with nutritionist at hemodialysis; reports "does the best" she can in adhering to diet.  Reports that she understands importance of adhering to hemodialysis schedule; states "understands" it is life support for her.  Confirms she has scheduled PCP office visit; no concerns identified around transportation.  Patient denies further issues, concerns, or problems today.  I provided/ confirmed that patient has my direct phone number, the main [facility] CM office phone number, and the [facility] CM 24-hour nurse advice phone number should issues arise prior to next scheduled THN CM outreach in 3 weeks around patient's preference due to provider appointments and ongoing hemodialysis.  Encouraged patient to contact me directly if needs, questions, issues, or concerns arise prior to next scheduled outreach; patient agreed to do so.   Goals Addressed            This Visit's Progress   . THN CM: Follow My Treatment Plan       Follow Up Date 10/10/2020    - call the doctor or nurse to get help with side effects - keep follow-up appointments - keep taking my medicines, even when I feel good    Why is this important?   Staying as healthy as you can is very important. This may mean making changes if you smoke, don't exercise or eat poorly.  A healthy lifestyle is an important goal for you.  Following the treatment plan and making changes may be hard.  Try some of these steps to help keep the disease from getting worse.     Notes:     . THN  CM: Learn More About My Health       Follow Up Date 10/10/2020   - tell my story and reason for my visit - make a list of questions - ask questions - repeat what I heard to make sure I understand - bring a list of my medicines to the visit - speak up when I don't understand    Why is this  important?   The best way to learn about your health and care is by talking to the doctor and nurse.  They will answer your questions and give you information in the way that you like best.    Notes:     . THN CM: Make and Keep All Appointments       Follow Up Date 10/10/20   - ask family or friend for a ride - keep a calendar with appointment dates    Why is this important?   Part of staying healthy is seeing the doctor for follow-up care.  If you forget your appointments, there are some things you can do to stay on track.    Notes:     . THN CM: Manage My Diet       Follow Up Date 10/10/2020    - keep healthy snacks on hand - meet with dietitian - track weight in diary - watch for swelling in feet, ankles and legs every day    Why is this important?   A healthy diet is important for mental and physical health.  Healthy food helps repair damaged body tissue and maintains strong bones and muscles.  No single food is just right so eating a variety of proteins, fruits, vegetables and grains is best.  You may need to change what you eat or drink to manage kidney disease.  A dietitian is the best person to guide you.     Notes:     . THN CM: Manage My Medicine       Follow Up Date 10/10/2020    - call for medicine refill 2 or 3 days before it runs out - call if I am sick and can't take my medicine - keep a list of all the medicines I take; vitamins and herbals too - use a pillbox to sort medicine    Why is this important?   These steps will help you keep on track with your medicines.    Notes:     . THN CM: Matintain My Quality of Life       Follow Up Date 10/10/2020    - complete a living will - discuss my treatment options with the doctor or nurse - make shared treatment decisions with doctor - name a health care proxy (decision maker)    Why is this important?   Having a long-term illness can be scary.  It can also be stressful for you and your caregiver.   These steps may help.    Notes:     . THN CM: Protect My Health       Follow Up Date 10/10/2020    - schedule appointment for flu shot - schedule appointment for vaccines needed due to my age or health    Why is this important?   Screening tests can find diseases early when they are easier to treat.  Your doctor or nurse will talk with you about which tests are important for you.  Getting shots for common diseases like the flu and shingles will help prevent them.  Notes:     Marland Kitchen THN CM: Track and Manage My Symptoms       Follow Up Date 10/10/2020    - balance periods of rest with activity as needed - make shared treatment decisions with doctor - pace activity allowing for rest - take medicine as prescribed - watch for early signs of feeling worse    Why is this important?   Keeping track of symptoms can help you feel the best. It also helps the doctor stay on top of any changes to the disease. It may also help keep your disease from getting worse. Taking simple steps can help you cope with   symptoms like feeling very tired or itchy skin.     Notes:       I appreciate the opportunity to participate in [place]'s care,  [name], RN, BSN, [place] Coordinator [facility] Care Management  [phone]

## 2020-09-20 DIAGNOSIS — N2581 Secondary hyperparathyroidism of renal origin: Secondary | ICD-10-CM | POA: Diagnosis not present

## 2020-09-20 DIAGNOSIS — D631 Anemia in chronic kidney disease: Secondary | ICD-10-CM | POA: Diagnosis not present

## 2020-09-20 DIAGNOSIS — Z992 Dependence on renal dialysis: Secondary | ICD-10-CM | POA: Diagnosis not present

## 2020-09-20 DIAGNOSIS — N179 Acute kidney failure, unspecified: Secondary | ICD-10-CM | POA: Diagnosis not present

## 2020-09-20 DIAGNOSIS — D509 Iron deficiency anemia, unspecified: Secondary | ICD-10-CM | POA: Diagnosis not present

## 2020-09-23 DIAGNOSIS — Z992 Dependence on renal dialysis: Secondary | ICD-10-CM | POA: Diagnosis not present

## 2020-09-23 DIAGNOSIS — D631 Anemia in chronic kidney disease: Secondary | ICD-10-CM | POA: Diagnosis not present

## 2020-09-23 DIAGNOSIS — N179 Acute kidney failure, unspecified: Secondary | ICD-10-CM | POA: Diagnosis not present

## 2020-09-23 DIAGNOSIS — D509 Iron deficiency anemia, unspecified: Secondary | ICD-10-CM | POA: Diagnosis not present

## 2020-09-23 DIAGNOSIS — N2581 Secondary hyperparathyroidism of renal origin: Secondary | ICD-10-CM | POA: Diagnosis not present

## 2020-09-25 DIAGNOSIS — D509 Iron deficiency anemia, unspecified: Secondary | ICD-10-CM | POA: Diagnosis not present

## 2020-09-25 DIAGNOSIS — D631 Anemia in chronic kidney disease: Secondary | ICD-10-CM | POA: Diagnosis not present

## 2020-09-25 DIAGNOSIS — Z992 Dependence on renal dialysis: Secondary | ICD-10-CM | POA: Diagnosis not present

## 2020-09-25 DIAGNOSIS — N2581 Secondary hyperparathyroidism of renal origin: Secondary | ICD-10-CM | POA: Diagnosis not present

## 2020-09-25 DIAGNOSIS — N179 Acute kidney failure, unspecified: Secondary | ICD-10-CM | POA: Diagnosis not present

## 2020-09-26 ENCOUNTER — Other Ambulatory Visit: Payer: Self-pay

## 2020-09-26 ENCOUNTER — Ambulatory Visit (INDEPENDENT_AMBULATORY_CARE_PROVIDER_SITE_OTHER): Payer: Medicare Other | Admitting: Internal Medicine

## 2020-09-26 ENCOUNTER — Encounter: Payer: Self-pay | Admitting: Internal Medicine

## 2020-09-26 VITALS — BP 148/60 | HR 91 | Temp 98.6°F | Ht 62.0 in

## 2020-09-26 DIAGNOSIS — N186 End stage renal disease: Secondary | ICD-10-CM | POA: Diagnosis not present

## 2020-09-26 DIAGNOSIS — E785 Hyperlipidemia, unspecified: Secondary | ICD-10-CM

## 2020-09-26 DIAGNOSIS — J9611 Chronic respiratory failure with hypoxia: Secondary | ICD-10-CM | POA: Insufficient documentation

## 2020-09-26 DIAGNOSIS — R739 Hyperglycemia, unspecified: Secondary | ICD-10-CM | POA: Diagnosis not present

## 2020-09-26 DIAGNOSIS — I10 Essential (primary) hypertension: Secondary | ICD-10-CM

## 2020-09-26 DIAGNOSIS — J449 Chronic obstructive pulmonary disease, unspecified: Secondary | ICD-10-CM | POA: Insufficient documentation

## 2020-09-26 MED ORDER — ROSUVASTATIN CALCIUM 10 MG PO TABS
10.0000 mg | ORAL_TABLET | Freq: Every day | ORAL | 3 refills | Status: AC
Start: 2020-09-26 — End: ?

## 2020-09-26 NOTE — Assessment & Plan Note (Signed)
To cont hD at m-w-f

## 2020-09-26 NOTE — Patient Instructions (Signed)
We understand that [facility] is not able to deliver new portable tanks due to lack of manpower, and your husband is not able to physically carry and change the portable tanks at the [place] office in [facility] or [place]  We will need to change your [place] will ask [employer] to start doing this, or another company if this is not possible  Please continue all other medications as before, and refills have been done if requested.  Please have the pharmacy call with any other refills you may need.  Please continue your efforts at being more active, low cholesterol diet, and weight control.  Please keep your appointments with your specialists as you may have planned  Please make an Appointment to return in 2 weeks (on a Tuesday or Thursday due to your dialysis)

## 2020-09-26 NOTE — Assessment & Plan Note (Signed)
O/w stable, cont same tx, and pulm followup

## 2020-09-26 NOTE — Assessment & Plan Note (Signed)
stable overall by history and exam, recent data reviewed with pt, and pt to continue medical treatment as before,  to f/u any worsening symptoms or concerns  

## 2020-09-26 NOTE — Progress Notes (Signed)
Subjective:    Patient ID: Cassandra Dyer, female    DOB: 1940-12-30, 79 y.o.   MRN: [id number]  HPI  Pt here to f/u post hospn 9/28-9/30 with copd, chronic hypoxic resp failure, viral AGE, hTN urgency, ESRD now having started HD on M-W-F; pt was d/w on 2l home o2 with a starter tank for home, then was delivered first delivery 6 portable tanks supplied per [place]; pt was told to all for next delivery when down to 2 tanks, which he did, was told he/she could no longer expect home delivery port home o2 tanks, and in fact he would need to bring the tanks himself to the [place] or [place] office to get them exchanged himself, which the husband finds he cannot do due to advanced age and unable to drive beyond the areas he knows well during the day.  They were attempted to be assisted per Renal mD last wk, but still unable for home o2 delivery as [facility] does not have the manpower to do this at this time.  Pt still has home o2 that is stable, but as she was down to her last port o2 tank, the husband thought they would just bring the tank and save the oxygen for some reason by not having her on it for the short trip to this office from home. Despite bein g less than 5 miles, her trip was about 40 minutes.  On arrival her sat is in 17s and quickly placed on 3L with sat now 92% after a rest period over 30 minutes.  Pt has an oximeter at home but has not used in the past wk or on the trip here.  After this drama, pt states she does have fatigue and sob at home on the o2 2L though Pt denies chest pain, increased sob or doe, wheezing, orthopnea, PND, increased LE swelling, palpitations, dizziness or syncope. Pt denies new neurological symptoms such as new headache, or facial or extremity weakness or numbness   Pt denies polydipsia, polyuria Past Medical History:  Diagnosis Date  . Anemia   . Hepatitis    hx of hep C - 10-20 years ago   . History of blood transfusion   . History of kidney cancer   .  Hyperlipidemia   . Hypertension    not taken b/p med in 2-3 years md deceased never went to another md    Past Surgical History:  Procedure Laterality Date  . AV FISTULA PLACEMENT Left 08/07/2020   Procedure: LEFT ARM ARTERIOVENOUS (AV) FISTULA CREATION;  Surgeon: [name], MD;  Location: [facility];  Service: Vascular;  Laterality: Left;  . CATARACT EXTRACTION Bilateral    11/30 left /12/9 right  . IR FLUORO GUIDE CV LINE RIGHT  07/27/2020  . IR US GUIDE VASC ACCESS RIGHT  07/27/2020  . ROBOT ASSISTED LAPAROSCOPIC NEPHRECTOMY Right 01/13/2017   Procedure: XI ROBOTIC ASSISTED LAPAROSCOPIC RADICAL NEPHRECTOMY WITH LYSIS OF ADHESION;  Surgeon: Alexis Frock, MD;  Location: [facility];  Service: Urology;  Laterality: Right;    reports that she quit smoking about 19 years ago. Her smoking use included cigarettes. She has a 15.00 pack-year smoking history. She has never used smokeless tobacco. She reports current alcohol use. She reports that she does not use drugs. family history includes Breast cancer in her sister; Hypertension in her mother; Pancreatic cancer in her father. No Known Allergies Current Outpatient Medications on File Prior to Visit  Medication Sig Dispense Refill  .  amLODipine (NORVASC) 10 MG tablet Take 1 tablet (10 mg total) by mouth daily. 90 tablet 3  . ascorbic acid (VITAMIN C) 500 MG tablet SMARTSIG:1 Tablet(s) By Mouth Morning-Night    . benzonatate (TESSALON) 200 MG capsule Take 1 capsule (200 mg total) by mouth 3 (three) times daily as needed. (Patient taking differently: Take 200 mg by mouth 3 (three) times daily as needed for cough. ) 30 capsule 0  . Black Currant Seed Oil 500 MG CAPS Take 500 mg by mouth daily.    . Calcium Carbonate-Vit D-Min (CALCIUM 1200 PO) Take 1,200 mg by mouth daily.     . carvedilol (COREG) 12.5 MG tablet Take 1 tablet (12.5 mg total) by mouth 2 (two) times daily with a meal. 60 tablet 1  . Cholecalciferol (VITAMIN D3) 50 MCG (2000 UT) capsule Take  2,000 Units by mouth every morning.    . Garlic 5852 MG CAPS Take 2,000 mg by mouth daily.     . Multiple Vitamin (MULTIVITAMIN WITH MINERALS) TABS tablet Take 1 tablet by mouth daily.    . Omega-3 Fatty Acids (FISH OIL PO) Take 1 tablet by mouth daily.    . ondansetron (ZOFRAN) 4 MG tablet Take 1 tablet (4 mg total) by mouth every 8 (eight) hours as needed for nausea or vomiting. 30 tablet 1  . OVER THE COUNTER MEDICATION Take 1 capsule by mouth daily. Mega juice    . polyethylene glycol (MIRALAX / GLYCOLAX) 17 g packet Take 17 g by mouth daily as needed for mild constipation.    . senna-docusate (SENOKOT-S) 8.6-50 MG tablet Take 1 tablet by mouth daily as needed for mild constipation.     No current facility-administered medications on file prior to visit.   Review of Systems All otherwise neg per pt    Objective:   Physical Exam BP (!) 148/60 (BP Location: Left Arm, Patient Position: Sitting, Cuff Size: Large)   Pulse 91   Temp 98.6 F (37 C) (Oral)   Ht 5\' 2"  (1.575 m)   SpO2 (!) 88%   BMI 25.40 kg/m  Now at rest on 3L - 92% VS noted,  Constitutional: Pt appears in NAD HENT: Head: NCAT.  Right Ear: External ear normal.  Left Ear: External ear normal.  Eyes: . Pupils are equal, round, and reactive to light. Conjunctivae and EOM are normal Nose: without d/c or deformity Neck: Neck supple. Gross normal ROM Cardiovascular: Normal rate and regular rhythm.   Pulmonary/Chest: Effort normal and breath sounds decreased without rales or wheezing.  Abd:  Soft, NT, ND, + BS, no organomegaly Neurological: Pt is alert. At baseline orientation, motor grossly intact Skin: Skin is warm. No rashes, other new lesions, no LE edema Psychiatric: Pt behavior is normal without agitation  All otherwise neg per pt Lab Results  Component Value Date   WBC 14.2 (H) 09/12/2020   HGB 10.7 (L) 09/12/2020   HCT 32.3 (L) 09/12/2020   PLT 157 09/12/2020   GLUCOSE 116 (H) 09/12/2020   CHOL 112  06/29/2019   TRIG 172.0 (H) 06/29/2019   HDL 32.30 (L) 06/29/2019   LDLCALC 45 06/29/2019   ALT 14 09/11/2020   AST 29 09/11/2020   NA 137 09/12/2020   K 4.0 09/12/2020   CL 99 09/12/2020   CREATININE 5.18 (H) 09/12/2020   BUN 21 09/12/2020   CO2 26 09/12/2020   TSH 0.482 04/08/2020   INR 1.1 08/07/2020   HGBA1C 5.6 04/08/2020  Assessment & Plan:

## 2020-09-26 NOTE — Assessment & Plan Note (Addendum)
Exam stable, but pt clearly unable to be weaned off o2 and likely now chronic dependent; has stable o2 source at home, but [facility] unable to supply/deliver further portable o2 that she needs for frequent MD visits, and husband unable to get the tanks to the [facility] office to exchange them; I counseld pt and husband on need to remain on o2 at all times, in fact 2L continuous and 3L with exertion, but will need different oxygen company source; I will refer to [facility] or similar that hopefully has manpower to accomplish her home exchange of portable o2 tanks, as she will be home bound permanently without this service.  Pt to f/u 2 wks here  I spent 41 minutes in preparing to see the patient by review of recent labs, imaging and procedures, obtaining and reviewing separately obtained history, communicating with the patient and family or caregiver, ordering medications, tests or procedures, and documenting clinical information in the EHR including the differential Dx, treatment, and any further evaluation and other management of chronic hypoxic resp failure, htn, hyperglycemia,

## 2020-09-27 DIAGNOSIS — D631 Anemia in chronic kidney disease: Secondary | ICD-10-CM | POA: Diagnosis not present

## 2020-09-27 DIAGNOSIS — N2581 Secondary hyperparathyroidism of renal origin: Secondary | ICD-10-CM | POA: Diagnosis not present

## 2020-09-27 DIAGNOSIS — N179 Acute kidney failure, unspecified: Secondary | ICD-10-CM | POA: Diagnosis not present

## 2020-09-27 DIAGNOSIS — Z992 Dependence on renal dialysis: Secondary | ICD-10-CM | POA: Diagnosis not present

## 2020-09-27 DIAGNOSIS — D509 Iron deficiency anemia, unspecified: Secondary | ICD-10-CM | POA: Diagnosis not present

## 2020-09-30 DIAGNOSIS — D509 Iron deficiency anemia, unspecified: Secondary | ICD-10-CM | POA: Diagnosis not present

## 2020-09-30 DIAGNOSIS — N2581 Secondary hyperparathyroidism of renal origin: Secondary | ICD-10-CM | POA: Diagnosis not present

## 2020-09-30 DIAGNOSIS — D631 Anemia in chronic kidney disease: Secondary | ICD-10-CM | POA: Diagnosis not present

## 2020-09-30 DIAGNOSIS — N179 Acute kidney failure, unspecified: Secondary | ICD-10-CM | POA: Diagnosis not present

## 2020-09-30 DIAGNOSIS — Z992 Dependence on renal dialysis: Secondary | ICD-10-CM | POA: Diagnosis not present

## 2020-10-01 ENCOUNTER — Encounter: Payer: Self-pay | Admitting: Internal Medicine

## 2020-10-01 NOTE — Telephone Encounter (Signed)
Error

## 2020-10-02 DIAGNOSIS — N2581 Secondary hyperparathyroidism of renal origin: Secondary | ICD-10-CM | POA: Diagnosis not present

## 2020-10-02 DIAGNOSIS — N179 Acute kidney failure, unspecified: Secondary | ICD-10-CM | POA: Diagnosis not present

## 2020-10-02 DIAGNOSIS — Z992 Dependence on renal dialysis: Secondary | ICD-10-CM | POA: Diagnosis not present

## 2020-10-02 DIAGNOSIS — D631 Anemia in chronic kidney disease: Secondary | ICD-10-CM | POA: Diagnosis not present

## 2020-10-02 DIAGNOSIS — D509 Iron deficiency anemia, unspecified: Secondary | ICD-10-CM | POA: Diagnosis not present

## 2020-10-03 ENCOUNTER — Telehealth: Payer: Self-pay | Admitting: Internal Medicine

## 2020-10-03 NOTE — Telephone Encounter (Signed)
    Please update [employer] order to include "concentrator and portable tank" wording Any questions call Estill Bamberg at [place]

## 2020-10-04 DIAGNOSIS — N2581 Secondary hyperparathyroidism of renal origin: Secondary | ICD-10-CM | POA: Diagnosis not present

## 2020-10-04 DIAGNOSIS — D631 Anemia in chronic kidney disease: Secondary | ICD-10-CM | POA: Diagnosis not present

## 2020-10-04 DIAGNOSIS — D509 Iron deficiency anemia, unspecified: Secondary | ICD-10-CM | POA: Diagnosis not present

## 2020-10-04 DIAGNOSIS — N179 Acute kidney failure, unspecified: Secondary | ICD-10-CM | POA: Diagnosis not present

## 2020-10-04 DIAGNOSIS — Z992 Dependence on renal dialysis: Secondary | ICD-10-CM | POA: Diagnosis not present

## 2020-10-07 ENCOUNTER — Telehealth: Payer: Self-pay | Admitting: Internal Medicine

## 2020-10-07 DIAGNOSIS — Z992 Dependence on renal dialysis: Secondary | ICD-10-CM | POA: Diagnosis not present

## 2020-10-07 DIAGNOSIS — D509 Iron deficiency anemia, unspecified: Secondary | ICD-10-CM | POA: Diagnosis not present

## 2020-10-07 DIAGNOSIS — D631 Anemia in chronic kidney disease: Secondary | ICD-10-CM | POA: Diagnosis not present

## 2020-10-07 DIAGNOSIS — N2581 Secondary hyperparathyroidism of renal origin: Secondary | ICD-10-CM | POA: Diagnosis not present

## 2020-10-07 DIAGNOSIS — N179 Acute kidney failure, unspecified: Secondary | ICD-10-CM | POA: Diagnosis not present

## 2020-10-07 NOTE — Telephone Encounter (Signed)
Sent to Dr. John. 

## 2020-10-07 NOTE — Telephone Encounter (Signed)
Calling again requesting this order for the patient

## 2020-10-07 NOTE — Telephone Encounter (Signed)
  [facility] calling, states they are faxing a form for completion for medical supply Phone [phone]

## 2020-10-08 ENCOUNTER — Encounter (HOSPITAL_COMMUNITY): Payer: Medicare Other

## 2020-10-08 NOTE — Telephone Encounter (Signed)
  Follow up message  The DME order is in Epic on 10/14 visit.

## 2020-10-08 NOTE — Telephone Encounter (Signed)
Ok but I dont have a paperwork for this, am I supposed to?

## 2020-10-09 DIAGNOSIS — N179 Acute kidney failure, unspecified: Secondary | ICD-10-CM | POA: Diagnosis not present

## 2020-10-09 DIAGNOSIS — D631 Anemia in chronic kidney disease: Secondary | ICD-10-CM | POA: Diagnosis not present

## 2020-10-09 DIAGNOSIS — Z992 Dependence on renal dialysis: Secondary | ICD-10-CM | POA: Diagnosis not present

## 2020-10-09 DIAGNOSIS — D509 Iron deficiency anemia, unspecified: Secondary | ICD-10-CM | POA: Diagnosis not present

## 2020-10-09 DIAGNOSIS — N2581 Secondary hyperparathyroidism of renal origin: Secondary | ICD-10-CM | POA: Diagnosis not present

## 2020-10-10 ENCOUNTER — Other Ambulatory Visit: Payer: Self-pay

## 2020-10-10 ENCOUNTER — Encounter: Payer: Self-pay | Admitting: *Deleted

## 2020-10-10 ENCOUNTER — Encounter: Payer: Self-pay | Admitting: Internal Medicine

## 2020-10-10 ENCOUNTER — Other Ambulatory Visit: Payer: Self-pay | Admitting: *Deleted

## 2020-10-10 ENCOUNTER — Ambulatory Visit (INDEPENDENT_AMBULATORY_CARE_PROVIDER_SITE_OTHER): Payer: Medicare Other | Admitting: Internal Medicine

## 2020-10-10 VITALS — BP 144/64 | HR 67 | Temp 98.6°F | Ht 62.0 in | Wt 136.0 lb

## 2020-10-10 DIAGNOSIS — J449 Chronic obstructive pulmonary disease, unspecified: Secondary | ICD-10-CM

## 2020-10-10 DIAGNOSIS — Z7189 Other specified counseling: Secondary | ICD-10-CM | POA: Diagnosis not present

## 2020-10-10 DIAGNOSIS — J9611 Chronic respiratory failure with hypoxia: Secondary | ICD-10-CM | POA: Diagnosis not present

## 2020-10-10 NOTE — Assessment & Plan Note (Signed)
stable overall by history and exam, recent data reviewed with pt, and pt to continue medical treatment as before,  to f/u any worsening symptoms or concerns  

## 2020-10-10 NOTE — Patient Instructions (Signed)
You have your covid booster shot appt:  Appointment details: When: Saturday, October 12, 2020 at 11:00 AM EDT  Add to my calendar What: COVID-19 [employer]) Where: [place], [place] [address]  Your husband Butler Denmark appt is: Appointment details: When: Saturday, October 12, 2020 at 03:30 PM EDT  Add to my calendar What: COVID-19 Therapist, music) Where: [place], [place] [address]  Please continue all other medications as before, and refills have been done if requested.  Please have the pharmacy call with any other refills you may need.  Please continue your efforts at being more active, low cholesterol diet, and weight control.  Please keep your appointments with your specialists as you may have planned  Please make an Appointment to return in 4 months

## 2020-10-10 NOTE — Telephone Encounter (Signed)
Updated form has been faxed on 10/10/2020.

## 2020-10-10 NOTE — Assessment & Plan Note (Signed)
D/w pt and friend natural hx, and made booster shot appts for them both for sat oct 30

## 2020-10-10 NOTE — Assessment & Plan Note (Addendum)
Stable with Home o2 per [employer] now  I spent 31 minutes in preparing to see the patient by review of recent labs, imaging and procedures, obtaining and reviewing separately obtained history, communicating with the patient and family or caregiver, ordering medications, tests or procedures, and documenting clinical information in the EHR including the differential Dx, treatment, and any further evaluation and other management of chronic hypoxic resp failure, copd, and educatoin about covid

## 2020-10-10 NOTE — Patient Outreach (Signed)
[facility] [place] [facility]) Care Management [facility] CM Telephone Outreach  PCP office completes Transition of Care outreach post-hospital discharge Post-hospital discharge day # 28 without unplanned hospital re-admission  10/10/2020  Cassandra Dyer [birth year] [id number]  Successfuloutreach attempt to Cassandra Dyer, 79 y/o female referred to [place] CM 09/12/20 by [facility] Liaison after patient experienced hospitalization September 27-30, 2021 for N/V/D with malaise and weakness; patient had bilateral pleural effusions and was diagnosed with gastroenteritis. She had missed a hemodialysis session upon becoming sick at home. Patient has history including, but not limited to, renal cell cancer with (R) nephrectomy 2018; ESRD- on hemodialysis since 07/2020; COPD- on home O2 at baseline at 2 L/min; HTN/ HLD.  HIPAA/ identity verified; patient reports doing "real good" and she denies clinical concerns/ issues/ problems.  Reports "just got home from doctors office;" states she "got a good report" from her PCP today and she confirms that she is using home O2, and now has a new home O2 DME provider; using home O2 between 2-3 L/min consistently.  Reports has missed no provider appointments or hemodialysis sessions and verbalizes accurate understanding of all scheduled appointments. Significant other Eddie Dibbles continues to provide transportation.  Denies recent changes to medications; patient was recently discharged from hospital and all medications were reviewed with patient, who reports continues to self manage medications.  No concerns or discrepancies identified as a result of medication review today.  Patient sounds to be in no distress and denies further issues, concerns, or problems today. I confirmed that patient hasmy direct phone number, the main [facility] CM office phone number, and the [facility] CM 24-hour nurse advice phone number should issues arise prior to next scheduled [facility] CM outreach next month.   Encouraged patient to contact me directly if needs, questions, issues, or concerns arise prior to next scheduled outreach; patient agreed to do so.  Plan:  Patient will take medications as prescribed and will attend all scheduled provider appointments  Patient will promptly notify care providers for any new concerns/ issues/ problems that arise  I will share today's [facility] CM note/ care plan with patient's PCP as [facility] CM initial assessment  [facility] CM outreach to continue with scheduled phone call next month  Goals Addressed            This Visit's Progress   . [facility] CM: Follow My Treatment Plan   On track    Follow Up Date 11/12/2020    - call the doctor or nurse to get help with side effects - keep follow-up appointments - keep taking my medicines, even when I feel good    Why is this important?   Staying as healthy as you can is very important. This may mean making changes if you smoke, don't exercise or eat poorly.  A healthy lifestyle is an important goal for you.  Following the treatment plan and making changes may be hard.  Try some of these steps to help keep the disease from getting worse.     Notes:   10/10/20: -- medication review completed with patient; no concerns or discrepancies identified     . COMPLETED: [facility] CM: Learn More About My Health   On track    Follow Up Date 10/10/2020   - tell my story and reason for my visit - make a list of questions - ask questions - repeat what I heard to make sure I understand - bring a list of my medicines to the visit - speak  up when I don't understand    Why is this important?   The best way to learn about your health and care is by talking to the doctor and nurse.  They will answer your questions and give you information in the way that you like best.    Notes:     . [facility] CM: Make and Keep All Appointments   On track    Follow Up Date 11/12/20   - ask family or friend for a ride - keep a calendar with appointment dates     Why is this important?   Part of staying healthy is seeing the doctor for follow-up care.  If you forget your appointments, there are some things you can do to stay on track.    Notes:   10/10/20: -- confirmed patient has attended all hemodialysis and provider appointments as scheduled -- reviewed all recent and upcoming provider appointments with patient; reports ongoing reliable transportation through significant other, Paul     . [facility] CM: Manage My Diet   On track    Follow Up Date 11/12/2020    - keep healthy snacks on hand - meet with dietitian - track weight in diary - watch for swelling in feet, ankles and legs every day    Why is this important?   A healthy diet is important for mental and physical health.  Healthy food helps repair damaged body tissue and maintains strong bones and muscles.  No single food is just right so eating a variety of proteins, fruits, vegetables and grains is best.  You may need to change what you eat or drink to manage kidney disease.  A dietitian is the best person to guide you.     Notes:   10/10/20: -- confirmed patient received previously mailed printed educational material around renal/ hemodialysis diet-- confirmed patient tries to follow diet; encouraged her ongoing review of/ adherence to dialysis diet    . [facility] CM: Manage My Medicine   On track    Follow Up Date 11/12/2020    - call for medicine refill 2 or 3 days before it runs out - call if I am sick and can't take my medicine - keep a list of all the medicines I take; vitamins and herbals too - use a pillbox to sort medicine    Why is this important?   These steps will help you keep on track with your medicines.    Notes:   10/10/20: -- confirmed patient continues self-managing medications, uses pill box -- medication review completed today with patient; no concerns/ discrepancies noted    . [facility] CM: Matintain My Quality of Life   On track    Follow Up Date 11/12/2020     - complete a living will - discuss my treatment options with the doctor or nurse - make shared treatment decisions with doctor - name a health care proxy (decision maker)    Why is this important?   Having a long-term illness can be scary.  It can also be stressful for you and your caregiver.  These steps may help.    Notes:     . [facility] CM: Protect My Health   On track    Follow Up Date 11/12/2020    - schedule appointment for flu shot - schedule appointment for vaccines needed due to my age or health    Why is this important?   Screening tests can find diseases early when they are easier to  treat.  Your doctor or nurse will talk with you about which tests are important for you.  Getting shots for common diseases like the flu and shingles will help prevent them.     Notes:   09/30/20: -- confirmed patient has scheduled appointment for 10/12/20 to obtain corona virus vaccine booster; confirmed patient has plans to attend and reliable transportation    . [facility] CM: Track and Manage My Symptoms   On track    Follow Up Date 11/12/2020    - balance periods of rest with activity as needed - make shared treatment decisions with doctor - pace activity allowing for rest - take medicine as prescribed - watch for early signs of feeling worse    Why is this important?   Keeping track of symptoms can help you feel the best. It also helps the doctor stay on top of any changes to the disease. It may also help keep your disease from getting worse. Taking simple steps can help you cope with   symptoms like feeling very tired or itchy skin.     Notes:   10/10/20: -- confirmed patient has no current clinical concerns -- reviewed with patient her use of home O2; confirmed that she now has reliable home O2 DME provider and has plenty of home O2 portable tanks; uses consistently between 2-3 L/min -- confirmed patient understands to contact care providers promptly for any new concerns/ issues/  problems that arise      [name], RN, BSN, [place] Coordinator [facility] Care Management  [phone]

## 2020-10-10 NOTE — Progress Notes (Signed)
Subjective:    Patient ID: Cassandra Dyer, female    DOB: Sep 28, 1941, 79 y.o.   MRN: [id number]  HPI  Here to f/u; overall doing ok,  Pt denies chest pain, increasing sob or doe, wheezing, orthopnea, PND, increased LE swelling, palpitations, dizziness or syncope.  Pt denies new neurological symptoms such as new headache, or facial or extremity weakness or numbness.  Pt denies polydipsia, polyuria, or low sugar episode.  Pt states overall good compliance with meds, Finally did receive her home o2 today per [employer] and working well.  No new compliants,  She and friend are due for booster shots but need help with appt scheduling Past Medical History:  Diagnosis Date  . Anemia   . Hepatitis    hx of hep C - 10-20 years ago   . History of blood transfusion   . History of kidney cancer   . Hyperlipidemia   . Hypertension    not taken b/p med in 2-3 years md deceased never went to another md    Past Surgical History:  Procedure Laterality Date  . AV FISTULA PLACEMENT Left 08/07/2020   Procedure: LEFT ARM ARTERIOVENOUS (AV) FISTULA CREATION;  Surgeon: [name], MD;  Location: [facility];  Service: Vascular;  Laterality: Left;  . CATARACT EXTRACTION Bilateral    11/30 left /12/9 right  . IR FLUORO GUIDE CV LINE RIGHT  07/27/2020  . IR US GUIDE VASC ACCESS RIGHT  07/27/2020  . ROBOT ASSISTED LAPAROSCOPIC NEPHRECTOMY Right 01/13/2017   Procedure: XI ROBOTIC ASSISTED LAPAROSCOPIC RADICAL NEPHRECTOMY WITH LYSIS OF ADHESION;  Surgeon: Alexis Frock, MD;  Location: [facility];  Service: Urology;  Laterality: Right;    reports that she quit smoking about 19 years ago. Her smoking use included cigarettes. She has a 15.00 pack-year smoking history. She has never used smokeless tobacco. She reports current alcohol use. She reports that she does not use drugs. family history includes Breast cancer in her sister; Hypertension in her mother; Pancreatic cancer in her father. No Known Allergies Current Outpatient  Medications on File Prior to Visit  Medication Sig Dispense Refill  . amLODipine (NORVASC) 10 MG tablet Take 1 tablet (10 mg total) by mouth daily. 90 tablet 3  . ascorbic acid (VITAMIN C) 500 MG tablet SMARTSIG:1 Tablet(s) By Mouth Morning-Night    . benzonatate (TESSALON) 200 MG capsule Take 1 capsule (200 mg total) by mouth 3 (three) times daily as needed. (Patient taking differently: Take 200 mg by mouth 3 (three) times daily as needed for cough. ) 30 capsule 0  . Black Currant Seed Oil 500 MG CAPS Take 500 mg by mouth daily.    . Calcium Carbonate-Vit D-Min (CALCIUM 1200 PO) Take 1,200 mg by mouth daily.     . carvedilol (COREG) 12.5 MG tablet Take 1 tablet (12.5 mg total) by mouth 2 (two) times daily with a meal. 60 tablet 1  . Cholecalciferol (VITAMIN D3) 50 MCG (2000 UT) capsule Take 2,000 Units by mouth every morning.    . Garlic 8756 MG CAPS Take 2,000 mg by mouth daily.     . Multiple Vitamin (MULTIVITAMIN WITH MINERALS) TABS tablet Take 1 tablet by mouth daily.    . Omega-3 Fatty Acids (FISH OIL PO) Take 1 tablet by mouth daily.    . ondansetron (ZOFRAN) 4 MG tablet Take 1 tablet (4 mg total) by mouth every 8 (eight) hours as needed for nausea or vomiting. 30 tablet 1  . OVER THE COUNTER  MEDICATION Take 1 capsule by mouth daily. Mega juice    . polyethylene glycol (MIRALAX / GLYCOLAX) 17 g packet Take 17 g by mouth daily as needed for mild constipation.    . rosuvastatin (CRESTOR) 10 MG tablet Take 1 tablet (10 mg total) by mouth daily. 90 tablet 3  . senna-docusate (SENOKOT-S) 8.6-50 MG tablet Take 1 tablet by mouth daily as needed for mild constipation.     No current facility-administered medications on file prior to visit.   Review of Systems All otherwise neg per pt   Objective:   Physical Exam BP (!) 144/64   Pulse 67   Temp 98.6 F (37 C) (Oral)   Ht 5\' 2"  (1.575 m)   Wt 136 lb (61.7 kg)   SpO2 93%   BMI 24.87 kg/m  VS noted,  Constitutional: Pt appears in  NAD HENT: Head: NCAT.  Right Ear: External ear normal.  Left Ear: External ear normal.  Eyes: . Pupils are equal, round, and reactive to light. Conjunctivae and EOM are normal Nose: without d/c or deformity Neck: Neck supple. Gross normal ROM Cardiovascular: Normal rate and regular rhythm.   Pulmonary/Chest: Effort normal and breath sounds decreaesd without rales or wheezing.  Abd:  Soft, NT, ND, + BS, no organomegaly Neurological: Pt is alert. At baseline orientation, motor grossly intact Skin: Skin is warm. No rashes, other new lesions, no LE edema Psychiatric: Pt behavior is normal without agitation  All otherwise neg per pt Lab Results  Component Value Date   WBC 14.2 (H) 09/12/2020   HGB 10.7 (L) 09/12/2020   HCT 32.3 (L) 09/12/2020   PLT 157 09/12/2020   GLUCOSE 116 (H) 09/12/2020   CHOL 112 06/29/2019   TRIG 172.0 (H) 06/29/2019   HDL 32.30 (L) 06/29/2019   LDLCALC 45 06/29/2019   ALT 14 09/11/2020   AST 29 09/11/2020   NA 137 09/12/2020   K 4.0 09/12/2020   CL 99 09/12/2020   CREATININE 5.18 (H) 09/12/2020   BUN 21 09/12/2020   CO2 26 09/12/2020   TSH 0.482 04/08/2020   INR 1.1 08/07/2020   HGBA1C 5.6 04/08/2020         Assessment & Plan:

## 2020-10-11 DIAGNOSIS — D631 Anemia in chronic kidney disease: Secondary | ICD-10-CM | POA: Diagnosis not present

## 2020-10-11 DIAGNOSIS — D509 Iron deficiency anemia, unspecified: Secondary | ICD-10-CM | POA: Diagnosis not present

## 2020-10-11 DIAGNOSIS — N179 Acute kidney failure, unspecified: Secondary | ICD-10-CM | POA: Diagnosis not present

## 2020-10-11 DIAGNOSIS — N2581 Secondary hyperparathyroidism of renal origin: Secondary | ICD-10-CM | POA: Diagnosis not present

## 2020-10-11 DIAGNOSIS — Z992 Dependence on renal dialysis: Secondary | ICD-10-CM | POA: Diagnosis not present

## 2020-10-11 NOTE — Telephone Encounter (Signed)
   Roman from [employer] again regarding a certificate of medical necessity for a device. Advised caller this  Company is not listed to be involved in patients care.  Caller hung up when I stated I would contact patient to see if a device was requested by [employer]  Possible scam call

## 2020-10-12 DIAGNOSIS — Z23 Encounter for immunization: Secondary | ICD-10-CM | POA: Diagnosis not present

## 2020-10-14 DIAGNOSIS — Z992 Dependence on renal dialysis: Secondary | ICD-10-CM | POA: Diagnosis not present

## 2020-10-14 DIAGNOSIS — N2581 Secondary hyperparathyroidism of renal origin: Secondary | ICD-10-CM | POA: Diagnosis not present

## 2020-10-14 DIAGNOSIS — D509 Iron deficiency anemia, unspecified: Secondary | ICD-10-CM | POA: Diagnosis not present

## 2020-10-14 DIAGNOSIS — N179 Acute kidney failure, unspecified: Secondary | ICD-10-CM | POA: Diagnosis not present

## 2020-10-16 DIAGNOSIS — N179 Acute kidney failure, unspecified: Secondary | ICD-10-CM | POA: Diagnosis not present

## 2020-10-16 DIAGNOSIS — D509 Iron deficiency anemia, unspecified: Secondary | ICD-10-CM | POA: Diagnosis not present

## 2020-10-16 DIAGNOSIS — N2581 Secondary hyperparathyroidism of renal origin: Secondary | ICD-10-CM | POA: Diagnosis not present

## 2020-10-16 DIAGNOSIS — Z992 Dependence on renal dialysis: Secondary | ICD-10-CM | POA: Diagnosis not present

## 2020-10-17 ENCOUNTER — Ambulatory Visit (INDEPENDENT_AMBULATORY_CARE_PROVIDER_SITE_OTHER): Payer: Self-pay | Admitting: Physician Assistant

## 2020-10-17 ENCOUNTER — Other Ambulatory Visit: Payer: Self-pay

## 2020-10-17 ENCOUNTER — Ambulatory Visit (HOSPITAL_COMMUNITY)
Admission: RE | Admit: 2020-10-17 | Discharge: 2020-10-17 | Disposition: A | Discharge status: Home / Self Care | Payer: Medicare Other | Source: Ambulatory Visit | Attending: Physician Assistant | Admitting: Physician Assistant

## 2020-10-17 VITALS — BP 141/65 | HR 81 | Temp 98.4°F | Resp 20 | Ht 62.0 in | Wt 136.0 lb

## 2020-10-17 DIAGNOSIS — N1832 Chronic kidney disease, stage 3b: Secondary | ICD-10-CM | POA: Diagnosis not present

## 2020-10-17 DIAGNOSIS — Z992 Dependence on renal dialysis: Secondary | ICD-10-CM

## 2020-10-17 DIAGNOSIS — N186 End stage renal disease: Secondary | ICD-10-CM

## 2020-10-17 NOTE — Progress Notes (Signed)
POST OPERATIVE OFFICE NOTE    CC:  F/u for surgery  HPI:  This is a 79 y.o. female who is s/p left BC AVF on 08/07/2020 by Dr. Donnetta Hutching.  Pt states she does not have any pain/numbness in left hand or arm. Her incision has healed well. She has a tunneled dialysis catheter placed  August 2021 by IR. The pt is on dialysis MWF at the [place] location.    No Known Allergies  Current Outpatient Medications  Medication Sig Dispense Refill  . amLODipine (NORVASC) 10 MG tablet Take 1 tablet (10 mg total) by mouth daily. 90 tablet 3  . ascorbic acid (VITAMIN C) 500 MG tablet SMARTSIG:1 Tablet(s) By Mouth Morning-Night    . Black Currant Seed Oil 500 MG CAPS Take 500 mg by mouth daily.    . Calcium Carbonate-Vit D-Min (CALCIUM 1200 PO) Take 1,200 mg by mouth daily.     . carvedilol (COREG) 12.5 MG tablet Take 1 tablet (12.5 mg total) by mouth 2 (two) times daily with a meal. 60 tablet 1  . Cholecalciferol (VITAMIN D3) 50 MCG (2000 UT) capsule Take 2,000 Units by mouth every morning.    . Garlic 8295 MG CAPS Take 2,000 mg by mouth daily.     . Multiple Vitamin (MULTIVITAMIN WITH MINERALS) TABS tablet Take 1 tablet by mouth daily.    . Omega-3 Fatty Acids (FISH OIL PO) Take 1 tablet by mouth daily.    . ondansetron (ZOFRAN) 4 MG tablet Take 1 tablet (4 mg total) by mouth every 8 (eight) hours as needed for nausea or vomiting. 30 tablet 1  . OVER THE COUNTER MEDICATION Take 1 capsule by mouth daily. Mega juice    . polyethylene glycol (MIRALAX / GLYCOLAX) 17 g packet Take 17 g by mouth daily as needed for mild constipation.    . rosuvastatin (CRESTOR) 10 MG tablet Take 1 tablet (10 mg total) by mouth daily. 90 tablet 3  . senna-docusate (SENOKOT-S) 8.6-50 MG tablet Take 1 tablet by mouth daily as needed for mild constipation.    . benzonatate (TESSALON) 200 MG capsule Take 1 capsule (200 mg total) by mouth 3 (three) times daily as needed. (Patient not taking: Reported on 10/10/2020) 30 capsule 0     No current facility-administered medications for this visit.     ROS:  See HPI  Physical Exam:  Vitals:   10/17/20 1456  BP: (!) 141/65  Pulse: 81  Resp: 20  Temp: 98.4 F (36.9 C)  SpO2: 92%    General: well appearing, well nourished, in no distress Incision: Left AC incision is well healed. No tenderness, swelling or hematoma Extremities:There is a 2+ palpable radial and brachial pulse.  Motor and sensory is in tact.  There is a thrill/bruit present. The fistula/graft is easily palpable in the distal upper arm. It appears to be tortuous in the mid to proximal arm and feels deeper in arm clinically. There is a visible vein that is overlying the fistula in the distal upper arm  Dialysis Duplex on 10/17/2020: Diameter:  0.59-0.89 Depth:  0.83 at shoulder but 0.31-0.48 from distal upper arm to proximal upper arm Volume flow: 788 ml/min   Assessment/Plan:  This is a 79 y.o. female who is s/p: left BC AVF on 08/07/2020 by Dr. Donnetta Hutching.  She had a tunneled dialysis catheter placed in August 2021 by IR. Duplex shows fistula with good volume flow, adequate depth and diameter. Clinically it is a little tortuous and  deep in upper arm. I think it is worth trying to use for access in a couple weeks. I discussed with patient and her husband that she may need elevation if they have difficulty cannulating the fistula.  -the pt does not have evidence of steal. -the fistula/graft can be used after 11/07/20 -If pt has a tunneled dialysis catheter and the access has been used successfully to the satisfaction of the dialysis center, the tunneled catheter can be scheduled to be removed at their discretion.   -the pt will follow up as needed    Paulo Fruit, [facility] [phone]  Clinic MD:  Oneida Alar

## 2020-10-18 DIAGNOSIS — N2581 Secondary hyperparathyroidism of renal origin: Secondary | ICD-10-CM | POA: Diagnosis not present

## 2020-10-18 DIAGNOSIS — Z992 Dependence on renal dialysis: Secondary | ICD-10-CM | POA: Diagnosis not present

## 2020-10-18 DIAGNOSIS — D509 Iron deficiency anemia, unspecified: Secondary | ICD-10-CM | POA: Diagnosis not present

## 2020-10-18 DIAGNOSIS — N179 Acute kidney failure, unspecified: Secondary | ICD-10-CM | POA: Diagnosis not present

## 2020-10-21 ENCOUNTER — Inpatient Hospital Stay (HOSPITAL_COMMUNITY)
Admission: EM | Admit: 2020-10-21 | Discharge: 2020-11-06 | DRG: 640 | Disposition: A | Discharge status: Home Health | Payer: Medicare Other | Attending: Family Medicine | Admitting: Family Medicine

## 2020-10-21 ENCOUNTER — Emergency Department (HOSPITAL_COMMUNITY): Payer: Medicare Other

## 2020-10-21 ENCOUNTER — Telehealth: Payer: Self-pay | Admitting: Internal Medicine

## 2020-10-21 ENCOUNTER — Other Ambulatory Visit: Payer: Self-pay

## 2020-10-21 ENCOUNTER — Encounter (HOSPITAL_COMMUNITY): Payer: Self-pay | Admitting: Emergency Medicine

## 2020-10-21 DIAGNOSIS — N186 End stage renal disease: Secondary | ICD-10-CM | POA: Diagnosis present

## 2020-10-21 DIAGNOSIS — Z85528 Personal history of other malignant neoplasm of kidney: Secondary | ICD-10-CM

## 2020-10-21 DIAGNOSIS — J811 Chronic pulmonary edema: Secondary | ICD-10-CM | POA: Diagnosis not present

## 2020-10-21 DIAGNOSIS — R590 Localized enlarged lymph nodes: Secondary | ICD-10-CM | POA: Diagnosis present

## 2020-10-21 DIAGNOSIS — Z87891 Personal history of nicotine dependence: Secondary | ICD-10-CM

## 2020-10-21 DIAGNOSIS — J9621 Acute and chronic respiratory failure with hypoxia: Secondary | ICD-10-CM

## 2020-10-21 DIAGNOSIS — N250 Renal osteodystrophy: Secondary | ICD-10-CM | POA: Diagnosis not present

## 2020-10-21 DIAGNOSIS — Z992 Dependence on renal dialysis: Secondary | ICD-10-CM | POA: Diagnosis not present

## 2020-10-21 DIAGNOSIS — R0600 Dyspnea, unspecified: Secondary | ICD-10-CM

## 2020-10-21 DIAGNOSIS — J90 Pleural effusion, not elsewhere classified: Secondary | ICD-10-CM | POA: Diagnosis not present

## 2020-10-21 DIAGNOSIS — C3432 Malignant neoplasm of lower lobe, left bronchus or lung: Secondary | ICD-10-CM | POA: Diagnosis present

## 2020-10-21 DIAGNOSIS — I10 Essential (primary) hypertension: Secondary | ICD-10-CM | POA: Diagnosis not present

## 2020-10-21 DIAGNOSIS — E871 Hypo-osmolality and hyponatremia: Secondary | ICD-10-CM | POA: Diagnosis present

## 2020-10-21 DIAGNOSIS — Z9889 Other specified postprocedural states: Secondary | ICD-10-CM

## 2020-10-21 DIAGNOSIS — E8770 Fluid overload, unspecified: Secondary | ICD-10-CM | POA: Diagnosis not present

## 2020-10-21 DIAGNOSIS — R918 Other nonspecific abnormal finding of lung field: Secondary | ICD-10-CM | POA: Diagnosis present

## 2020-10-21 DIAGNOSIS — R197 Diarrhea, unspecified: Secondary | ICD-10-CM | POA: Diagnosis present

## 2020-10-21 DIAGNOSIS — I120 Hypertensive chronic kidney disease with stage 5 chronic kidney disease or end stage renal disease: Secondary | ICD-10-CM | POA: Diagnosis present

## 2020-10-21 DIAGNOSIS — Z9981 Dependence on supplemental oxygen: Secondary | ICD-10-CM

## 2020-10-21 DIAGNOSIS — R531 Weakness: Secondary | ICD-10-CM | POA: Diagnosis not present

## 2020-10-21 DIAGNOSIS — E785 Hyperlipidemia, unspecified: Secondary | ICD-10-CM | POA: Diagnosis present

## 2020-10-21 DIAGNOSIS — D631 Anemia in chronic kidney disease: Secondary | ICD-10-CM | POA: Diagnosis present

## 2020-10-21 DIAGNOSIS — M810 Age-related osteoporosis without current pathological fracture: Secondary | ICD-10-CM | POA: Diagnosis present

## 2020-10-21 DIAGNOSIS — Z905 Acquired absence of kidney: Secondary | ICD-10-CM

## 2020-10-21 DIAGNOSIS — R0602 Shortness of breath: Secondary | ICD-10-CM

## 2020-10-21 DIAGNOSIS — Z20822 Contact with and (suspected) exposure to covid-19: Secondary | ICD-10-CM | POA: Diagnosis present

## 2020-10-21 DIAGNOSIS — I872 Venous insufficiency (chronic) (peripheral): Secondary | ICD-10-CM | POA: Diagnosis present

## 2020-10-21 DIAGNOSIS — C641 Malignant neoplasm of right kidney, except renal pelvis: Secondary | ICD-10-CM | POA: Diagnosis present

## 2020-10-21 DIAGNOSIS — R54 Age-related physical debility: Secondary | ICD-10-CM | POA: Diagnosis present

## 2020-10-21 DIAGNOSIS — J9601 Acute respiratory failure with hypoxia: Secondary | ICD-10-CM | POA: Diagnosis not present

## 2020-10-21 DIAGNOSIS — E8889 Other specified metabolic disorders: Secondary | ICD-10-CM | POA: Diagnosis present

## 2020-10-21 DIAGNOSIS — J449 Chronic obstructive pulmonary disease, unspecified: Secondary | ICD-10-CM | POA: Diagnosis present

## 2020-10-21 DIAGNOSIS — Z79899 Other long term (current) drug therapy: Secondary | ICD-10-CM

## 2020-10-21 DIAGNOSIS — J810 Acute pulmonary edema: Secondary | ICD-10-CM | POA: Diagnosis not present

## 2020-10-21 DIAGNOSIS — J910 Malignant pleural effusion: Secondary | ICD-10-CM | POA: Diagnosis present

## 2020-10-21 DIAGNOSIS — R112 Nausea with vomiting, unspecified: Secondary | ICD-10-CM | POA: Diagnosis present

## 2020-10-21 DIAGNOSIS — R634 Abnormal weight loss: Secondary | ICD-10-CM | POA: Diagnosis present

## 2020-10-21 HISTORY — DX: Dependence on renal dialysis: N18.6

## 2020-10-21 HISTORY — DX: End stage renal disease: Z99.2

## 2020-10-21 LAB — RESPIRATORY PANEL BY RT PCR (FLU A&B, COVID)
Influenza A by PCR: NEGATIVE
Influenza B by PCR: NEGATIVE
SARS Coronavirus 2 by RT PCR: NEGATIVE

## 2020-10-21 LAB — CBC
HCT: 31.5 % — ABNORMAL LOW (ref 36.0–46.0)
Hemoglobin: 9.2 g/dL — ABNORMAL LOW (ref 12.0–15.0)
MCH: 29.1 pg (ref 26.0–34.0)
MCHC: 29.2 g/dL — ABNORMAL LOW (ref 30.0–36.0)
MCV: 99.7 fL (ref 80.0–100.0)
Platelets: 303 10*3/uL (ref 150–400)
RBC: 3.16 MIL/uL — ABNORMAL LOW (ref 3.87–5.11)
RDW: 17.6 % — ABNORMAL HIGH (ref 11.5–15.5)
WBC: 15.4 10*3/uL — ABNORMAL HIGH (ref 4.0–10.5)
nRBC: 0 % (ref 0.0–0.2)

## 2020-10-21 LAB — BASIC METABOLIC PANEL
Anion gap: 14 (ref 5–15)
BUN: 28 mg/dL — ABNORMAL HIGH (ref 8–23)
CO2: 24 mmol/L (ref 22–32)
Calcium: 9.2 mg/dL (ref 8.9–10.3)
Chloride: 95 mmol/L — ABNORMAL LOW (ref 98–111)
Creatinine, Ser: 6.42 mg/dL — ABNORMAL HIGH (ref 0.44–1.00)
GFR, Estimated: 6 mL/min — ABNORMAL LOW (ref >60)
Glucose, Bld: 165 mg/dL — ABNORMAL HIGH (ref 70–99)
Potassium: 4.3 mmol/L (ref 3.5–5.1)
Sodium: 133 mmol/L — ABNORMAL LOW (ref 135–145)

## 2020-10-21 LAB — CBG MONITORING, ED: Glucose-Capillary: 109 mg/dL — ABNORMAL HIGH (ref 70–99)

## 2020-10-21 MED ORDER — CHLORHEXIDINE GLUCONATE CLOTH 2 % EX PADS
6.0000 | MEDICATED_PAD | Freq: Every day | CUTANEOUS | Status: DC
  Administered 2020-10-23 – 2020-11-06 (×12): 6 via TOPICAL

## 2020-10-21 NOTE — ED Triage Notes (Signed)
HD pt c/o increase weakness for the past 2 days getting worse today, pt miss HD today due to not feeling good.

## 2020-10-21 NOTE — Telephone Encounter (Signed)
She has to go through the ER. We cannot order anything like that for her.

## 2020-10-21 NOTE — Telephone Encounter (Signed)
I was able to speak with the pt and inform her of Murray's instructions. Pt has stated she understood and has no questions or concerns.

## 2020-10-21 NOTE — Telephone Encounter (Signed)
Sent to [facility] to advise.

## 2020-10-21 NOTE — Telephone Encounter (Signed)
Patient is inquiring about a blood transfusion she didn't go to dialysis today, but one was recommended  Goes to dialysis on the [place] location  Can she go to the hospital and get a transfusion, or does she need an order?  Please call the patient [phone]

## 2020-10-21 NOTE — ED Provider Notes (Signed)
[facility] Provider Note   CSN: [id number] Arrival date & time: 10/21/20  1732     History Chief Complaint  Patient presents with  . Weakness    Cassandra Dyer is a 79 y.o. female.  HPI       79 year old female with history of renal cell carcinoma status post right nephrectomy in 2018, ESRD on dialysis since August Monday Wednesday Friday, COPD with chronic respiratory failure on 2 L of home oxygen, hypertension, admission in September for fluid overload, nausea vomiting or diarrhea, who presents with continued generalized weakness, nausea, vomiting and fatigue over the last month since her admission with worsening shortness of breath and generalized weakness causing her to miss dialysis today.   Severe shortness of breath and fatigue Low appetite Feeling sick since she was admitted to the hospital 9/27-9/30 Progressively getting worse, and today walked from room to commode only a few feet and felt severe dyspnea M-W-F Dialysis, last had Friday  No fever Cough for months Other symptoms since Sept  Booster last weekend-1 wk ago  Past Medical History:  Diagnosis Date  . Anemia   . Hepatitis    hx of hep C - 10-20 years ago   . History of blood transfusion   . History of kidney cancer   . Hyperlipidemia   . Hypertension    not taken b/p med in 2-3 years md deceased never went to another md     Patient Active Problem List   Diagnosis Date Noted  . Educated about COVID-19 virus infection 10/10/2020  . Chronic hypoxemic respiratory failure ([place]) 09/26/2020  . COPD (chronic obstructive pulmonary disease) ([place]) 09/26/2020  . Nausea vomiting and diarrhea 09/10/2020  . ESRD (end stage renal disease) ([place])   . Volume overload   . Cough 08/04/2020  . Thrombocytopenia ([place]) 08/02/2020  . Anemia 08/02/2020  . Hyperkalemia 07/27/2020  . Hyperlipidemia 07/27/2020  . Metabolic acidosis 76/19/5093  . CKD (chronic kidney disease)  05/23/2020  . Solitary kidney, acquired 05/23/2020  . Fever 04/26/2020  . Acute hypoxemic respiratory failure ([place]) 04/19/2020  . Diarrhea 04/19/2020  . Nausea & vomiting 04/19/2020  . Pneumonia 04/09/2020  . Acute kidney injury (AKI) with acute tubular necrosis (ATN) ([place]) 04/09/2020  . Hypertensive urgency 04/09/2020  . History of hepatitis C 04/09/2020  . Sepsis ([place]) 04/08/2020  . Venous insufficiency 12/28/2019  . Hyperglycemia 06/29/2019  . Osteoporosis 02/20/2018  . Essential hypertension 02/15/2017  . Dyslipidemia 02/15/2017  . Renal cell carcinoma of right kidney ([place]) 02/15/2017    Past Surgical History:  Procedure Laterality Date  . AV FISTULA PLACEMENT Left 08/07/2020   Procedure: LEFT ARM ARTERIOVENOUS (AV) FISTULA CREATION;  Surgeon: [name], MD;  Location: [facility];  Service: Vascular;  Laterality: Left;  . CATARACT EXTRACTION Bilateral    11/30 left /12/9 right  . IR FLUORO GUIDE CV LINE RIGHT  07/27/2020  . IR US GUIDE VASC ACCESS RIGHT  07/27/2020  . ROBOT ASSISTED LAPAROSCOPIC NEPHRECTOMY Right 01/13/2017   Procedure: XI ROBOTIC ASSISTED LAPAROSCOPIC RADICAL NEPHRECTOMY WITH LYSIS OF ADHESION;  Surgeon: Alexis Frock, MD;  Location: [facility];  Service: Urology;  Laterality: Right;     OB History   No obstetric history on file.     Family History  Problem Relation Age of Onset  . Hypertension Mother   . Pancreatic cancer Father   . Breast cancer Sister     Social History   Tobacco Use  .  Smoking status: Former Smoker    Packs/day: 0.50    Years: 30.00    Pack years: 15.00    Types: Cigarettes    Quit date: 12/14/2000    Years since quitting: 19.8  . Smokeless tobacco: Never Used  Vaping Use  . Vaping Use: Never used  Substance Use Topics  . Alcohol use: Yes    Comment: occasional wine   . Drug use: No    Home Medications Prior to Admission medications   Medication Sig Start Date End Date Taking? Authorizing Provider  amLODipine (NORVASC)  10 MG tablet Take 1 tablet (10 mg total) by mouth daily. 04/19/20  Yes Biagio Borg, MD  ascorbic acid (VITAMIN C) 500 MG tablet Take 500 mg by mouth 2 (two) times daily.  08/31/20  Yes [provider]  Black Currant Seed Oil 500 MG CAPS Take 500 mg by mouth every morning.    Yes [provider]  Calcium Carbonate-Vit D-Min (CALCIUM 1200 PO) Take 1,200 mg by mouth every morning.    Yes [provider]  carvedilol (COREG) 12.5 MG tablet Take 1 tablet (12.5 mg total) by mouth 2 (two) times daily with a meal. 08/08/20  Yes Georgette Shell, MD  Cholecalciferol (VITAMIN D3) 50 MCG (2000 UT) capsule Take 2,000 Units by mouth every morning. 08/31/20  Yes [provider]  Garlic 0175 MG CAPS Take 2,000 mg by mouth daily.    Yes [provider]  Multiple Vitamin (MULTIVITAMIN WITH MINERALS) TABS tablet Take 1 tablet by mouth every morning.    Yes [provider]  Omega-3 Fatty Acids (FISH OIL PO) Take 1 capsule by mouth every morning.    Yes [provider]  ondansetron (ZOFRAN) 4 MG tablet Take 1 tablet (4 mg total) by mouth every 8 (eight) hours as needed for nausea or vomiting. 04/19/20  Yes Biagio Borg, MD  OVER THE COUNTER MEDICATION Take 1 capsule by mouth daily. Mega juice   Yes [provider]  polyethylene glycol (MIRALAX / GLYCOLAX) 17 g packet Take 17 g by mouth daily as needed for mild constipation. 09/12/20  Yes Domenic Polite, MD  rosuvastatin (CRESTOR) 10 MG tablet Take 1 tablet (10 mg total) by mouth daily. 09/26/20  Yes Biagio Borg, MD  benzonatate (TESSALON) 200 MG capsule Take 1 capsule (200 mg total) by mouth 3 (three) times daily as needed. Patient not taking: Reported on 10/10/2020 07/22/20   Marrian Salvage, FNP  metoprolol succinate (TOPROL-XL) 100 MG 24 hr tablet Take 100 mg by mouth daily. 10/15/20   [provider]  senna-docusate (SENOKOT-S) 8.6-50 MG tablet Take 1 tablet by mouth daily as  needed for mild constipation. Patient not taking: Reported on 10/21/2020 09/12/20   Domenic Polite, MD  telmisartan (MICARDIS) 80 MG tablet Take 80 mg by mouth daily. 10/15/20   [provider]    Allergies    Patient has no known allergies.  Review of Systems   Review of Systems  Constitutional: Positive for fatigue. Negative for fever.  HENT: Negative for sore throat.   Eyes: Negative for visual disturbance.  Respiratory: Positive for cough (a few month) and shortness of breath.   Cardiovascular: Negative for chest pain.  Gastrointestinal: Positive for diarrhea (since last month ), nausea and vomiting (yesterday or Friday, has been since she was in hospital in September).  Genitourinary: Negative for difficulty urinating.  Musculoskeletal: Negative for back pain and neck pain.  Skin: Negative for rash.  Neurological: Negative for syncope and headaches.    Physical Exam Updated Vital Signs BP (!) 160/69   Pulse 80   Temp (!) 97.5 F (36.4 C) (Oral)   Resp (!) 21   Wt 59 kg   SpO2 98%   BMI 23.78 kg/m   Physical Exam Vitals and nursing note reviewed.  Constitutional:      General: She is not in acute distress.    Appearance: She is well-developed. She is not diaphoretic.  HENT:     Head: Normocephalic and atraumatic.  Eyes:     Conjunctiva/sclera: Conjunctivae normal.  Cardiovascular:     Rate and Rhythm: Normal rate and regular rhythm.     Heart sounds: Normal heart sounds. No murmur heard.  No friction rub. No gallop.   Pulmonary:     Effort: Pulmonary effort is normal. No respiratory distress.     Breath sounds: Normal breath sounds. No wheezing or rales.  Abdominal:     General: There is no distension.     Palpations: Abdomen is soft.     Tenderness: There is no abdominal tenderness. There is no guarding.  Musculoskeletal:        General: No tenderness.     Cervical back: Normal range of motion.     Right lower leg: Edema present.     Left lower  leg: Edema present.  Skin:    General: Skin is warm and dry.     Findings: No erythema or rash.  Neurological:     Mental Status: She is alert and oriented to person, place, and time.     ED Results / Procedures / Treatments   Labs (all labs ordered are listed, but only abnormal results are displayed) Labs Reviewed  BASIC METABOLIC PANEL - Abnormal; Notable for the following components:      Result Value   Sodium 133 (*)    Chloride 95 (*)    Glucose, Bld 165 (*)    BUN 28 (*)    Creatinine, Ser 6.42 (*)    GFR, Estimated 6 (*)    All other components within normal limits  CBC - Abnormal; Notable for the following components:   WBC 15.4 (*)    RBC 3.16 (*)    Hemoglobin 9.2 (*)    HCT 31.5 (*)    MCHC 29.2 (*)    RDW 17.6 (*)    All other components within normal limits  CBG MONITORING, ED - Abnormal; Notable for the following components:   Glucose-Capillary 109 (*)    All other components within normal limits  RESPIRATORY PANEL BY RT PCR (FLU A&B, COVID)  URINALYSIS, ROUTINE W REFLEX MICROSCOPIC    EKG EKG Interpretation  Date/Time:  Monday October 21 2020 17:39:11 EST Ventricular Rate:  88 PR Interval:  112 QRS Duration: 72 QT Interval:  376 QTC Calculation: 454 R Axis:   41 Text Interpretation: Normal sinus rhythm Normal ECG No significant change since last tracing Confirmed by Gareth Morgan [phone]) on 10/21/2020 6:44:49 PM   Radiology DG Chest 2 View  Result Date: 10/21/2020 CLINICAL DATA:  79 year old female with shortness of breath and increased weakness. Missed hemodialysis today. EXAM: CHEST - 2 VIEW COMPARISON:  Portable chest 09/10/2020 and earlier. FINDINGS: AP and lateral views of the chest today. Stable right chest tunneled dialysis catheter. Moderate bilateral pleural effusions are increased since September, resembling similar effusions in August. Upper lung vascular congestion. Most mediastinal contours are obscured today. Visualized tracheal air  column is  within normal limits. No pneumothorax. No acute osseous abnormality identified. Abdominal Calcified aortic atherosclerosis. Paucity of bowel gas in the upper abdomen. IMPRESSION: Moderate bilateral pleural effusions are increased since September, with superimposed mild or developing pulmonary interstitial edema. Electronically Signed   By: Genevie Ann M.D.   On: 10/21/2020 18:31    Procedures Procedures (including critical care time)  Medications Ordered in ED Medications  Chlorhexidine Gluconate Cloth 2 % PADS 6 each (has no administration in time range)    ED Course  I have reviewed the triage vital signs and the nursing notes.  Pertinent labs & imaging results that were available during my care of the patient were reviewed by me and considered in my medical decision making (see chart for details).    MDM Rules/Calculators/A&P                           79 year old female with history of renal cell carcinoma status post right nephrectomy in 2018, ESRD on dialysis since August Monday Wednesday Friday, COPD with chronic respiratory failure on 2 L of home oxygen, hypertension, admission in September for fluid overload, nausea vomiting or diarrhea, who presents with continued generalized weakness, nausea, vomiting and fatigue over the last month since her admission with worsening shortness of breath and generalized weakness causing her to miss dialysis today.  Pt with acute on chronic respiratory failure requiring 4L today rather than 2 to maintain saturations.  XR shows volume overload. No signs of significant worsening anemia, doubt pneumonia, COVID testing negative.    Suspect symptoms related to volume overload. Will admit for continued care. Discussed with Dr. Valera Castle of Nephrology.    Final Clinical Impression(s) / ED Diagnoses Final diagnoses:  Acute and chronic respiratory failure with hypoxia (HCC)  Hypervolemia, unspecified hypervolemia type    Rx / DC Orders ED  Discharge Orders    None       Gareth Morgan, MD 10/22/20 [phone]

## 2020-10-21 NOTE — Consult Note (Signed)
Reason for Consult: To manage dialysis and dialysis related needs Referring Physician: Janeice Stegall is an 79 y.o. female with past medical history significant for COPD- home o2 dep at 2 liters, HTN, nephrectomy in 2018 and progression to ESRD in August of 2021- now HD at [facility] MWF via [facility]-  AVF is close to use.  Pt reports to the ER after missing HD today due to weakness and SOB-  She is anemic with hgb in the low 8's.  In the ER-   She was hypoxic-  Had to increase her O2 to 4 liters - she is a little hypertensive.  CXR shows pleural effusions and interstitial edema.  She says she has been SOB for a couple of weeks-  Has edema  Dialyzes at [facility] MWF 4 hours  EDW 62- getting there. HD [facility] 2/2.5, Dialyzer 180, Heparin no. Access [facility]- has left AVF that can be used after 11/25. mircera100 q 2 weeks, venofer 100 q tx right now 11/3 hgb 8.3 11/1 k 4  Past Medical History:  Diagnosis Date  . Anemia   . Hepatitis    hx of hep C - 10-20 years ago   . History of blood transfusion   . History of kidney cancer   . Hyperlipidemia   . Hypertension    not taken b/p med in 2-3 years md deceased never went to another md     Past Surgical History:  Procedure Laterality Date  . AV FISTULA PLACEMENT Left 08/07/2020   Procedure: LEFT ARM ARTERIOVENOUS (AV) FISTULA CREATION;  Surgeon: [name], MD;  Location: [facility];  Service: Vascular;  Laterality: Left;  . CATARACT EXTRACTION Bilateral    11/30 left /12/9 right  . IR FLUORO GUIDE CV LINE RIGHT  07/27/2020  . IR US GUIDE VASC ACCESS RIGHT  07/27/2020  . ROBOT ASSISTED LAPAROSCOPIC NEPHRECTOMY Right 01/13/2017   Procedure: XI ROBOTIC ASSISTED LAPAROSCOPIC RADICAL NEPHRECTOMY WITH LYSIS OF ADHESION;  Surgeon: Alexis Frock, MD;  Location[facility];  Service: Urology;  Laterality: Right;    Family History  Problem Relation Age of Onset  . Hypertension Mother   . Pancreatic cancer Father   . Breast cancer Sister     Social History:   reports that she quit smoking about 19 years ago. Her smoking use included cigarettes. She has a 15.00 pack-year smoking history. She has never used smokeless tobacco. She reports current alcohol use. She reports that she does not use drugs.  Allergies: No Known Allergies  Medications: I have reviewed the patient's current medications.   Results for orders placed or performed during the hospital encounter of 10/21/20 (from the past 48 hour(s))  Respiratory Panel by RT PCR (Flu A&B, Covid) - Nasopharyngeal Swab     Status: None   Collection Time: 10/21/20  6:04 PM   Specimen: Nasopharyngeal Swab  Result Value Ref Range   SARS Coronavirus 2 by RT PCR NEGATIVE NEGATIVE    Comment: (NOTE) SARS-CoV-2 target nucleic acids are NOT DETECTED.  The SARS-CoV-2 RNA is generally detectable in upper respiratoy specimens during the acute phase of infection. The lowest concentration of SARS-CoV-2 viral copies this assay can detect is 131 copies/mL. A negative result does not preclude SARS-Cov-2 infection and should not be used as the sole basis for treatment or other patient management decisions. A negative result may occur with  improper specimen collection/handling, submission of specimen other than nasopharyngeal swab, presence of viral mutation(s) within the areas targeted by  this assay, and inadequate number of viral copies (<131 copies/mL). A negative result must be combined with clinical observations, patient history, and epidemiological information. The expected result is Negative.  Fact Sheet for Patients:  PinkCheek.be  Fact Sheet for Healthcare Providers:  GravelBags.it  This test is no t yet approved or cleared by the [place] FDA and  has been authorized for detection and/or diagnosis of SARS-CoV-2 by FDA under an Emergency Use Authorization (EUA). This EUA will remain  in effect (meaning this test can be used) for the  duration of the COVID-19 declaration under Section 564(b)(1) of the Act, 21 U.S.C. section 360bbb-3(b)(1), unless the authorization is terminated or revoked sooner.     Influenza A by PCR NEGATIVE NEGATIVE   Influenza B by PCR NEGATIVE NEGATIVE    Comment: (NOTE) The Xpert Xpress SARS-CoV-2/FLU/RSV assay is intended as an aid in  the diagnosis of influenza from Nasopharyngeal swab specimens and  should not be used as a sole basis for treatment. Nasal washings and  aspirates are unacceptable for Xpert Xpress SARS-CoV-2/FLU/RSV  testing.  Fact Sheet for Patients: PinkCheek.be  Fact Sheet for Healthcare Providers: GravelBags.it  This test is not yet approved or cleared by the [place] FDA and  has been authorized for detection and/or diagnosis of SARS-CoV-2 by  FDA under an Emergency Use Authorization (EUA). This EUA will remain  in effect (meaning this test can be used) for the duration of the  Covid-19 declaration under Section 564(b)(1) of the Act, 21  U.S.C. section 360bbb-3(b)(1), unless the authorization is  terminated or revoked. Performed at [facility], [place] [address] [place], [place] [address]   Basic metabolic panel     Status: Abnormal   Collection Time: 10/21/20  6:27 PM  Result Value Ref Range   Sodium 133 (L) 135 - 145 mmol/L   Potassium 4.3 3.5 - 5.1 mmol/L   Chloride 95 (L) 98 - 111 mmol/L   CO2 24 22 - 32 mmol/L   Glucose, Bld 165 (H) 70 - 99 mg/dL    Comment: Glucose reference range applies only to samples taken after fasting for at least 8 hours.   BUN 28 (H) 8 - 23 mg/dL   Creatinine, Ser 6.42 (H) 0.44 - 1.00 mg/dL   Calcium 9.2 8.9 - 10.3 mg/dL   GFR, Estimated 6 (L) >60 mL/min    Comment: (NOTE) Calculated using the CKD-EPI Creatinine Equation (2021)    Anion gap 14 5 - 15    Comment: Performed at [facility] [address], [place], [place] [address]  CBC     Status:  Abnormal   Collection Time: 10/21/20  6:27 PM  Result Value Ref Range   WBC 15.4 (H) 4.0 - 10.5 K/uL   RBC 3.16 (L) 3.87 - 5.11 MIL/uL   Hemoglobin 9.2 (L) 12.0 - 15.0 g/dL   HCT 31.5 (L) 36 - 46 %   MCV 99.7 80.0 - 100.0 fL   MCH 29.1 26.0 - 34.0 pg   MCHC 29.2 (L) 30.0 - 36.0 g/dL   RDW 17.6 (H) 11.5 - 15.5 %   Platelets 303 150 - 400 K/uL   nRBC 0.0 0.0 - 0.2 %    Comment: Performed at [facility] [address], [place], [place] [address]  CBG monitoring, ED     Status: Abnormal   Collection Time: 10/21/20  8:10 PM  Result Value Ref Range   Glucose-Capillary 109 (H) 70 -  99 mg/dL    Comment: Glucose reference range applies only to samples taken after fasting for at least 8 hours.    DG Chest 2 View  Result Date: 10/21/2020 CLINICAL DATA:  79 year old female with shortness of breath and increased weakness. Missed hemodialysis today. EXAM: CHEST - 2 VIEW COMPARISON:  Portable chest 09/10/2020 and earlier. FINDINGS: AP and lateral views of the chest today. Stable right chest tunneled dialysis catheter. Moderate bilateral pleural effusions are increased since September, resembling similar effusions in August. Upper lung vascular congestion. Most mediastinal contours are obscured today. Visualized tracheal air column is within normal limits. No pneumothorax. No acute osseous abnormality identified. Abdominal Calcified aortic atherosclerosis. Paucity of bowel gas in the upper abdomen. IMPRESSION: Moderate bilateral pleural effusions are increased since September, with superimposed mild or developing pulmonary interstitial edema. Electronically Signed   By: Genevie Ann M.D.   On: 10/21/2020 18:31    ROS: SOB, cough Blood pressure (!) 160/69, pulse 80, temperature (!) 97.5 F (36.4 C), temperature source Oral, resp. rate (!) 21, weight 59 kg, SpO2 98 %. General appearance: alert, cooperative and no distress Resp: diminished breath sounds bibasilar and bilaterally Cardio: regular rate  and rhythm, S1, S2 normal, no murmur, click, rub or gallop GI: soft, non-tender; bowel sounds normal; no masses,  no organomegaly Extremities: edema 2 plus left upper arm AVF-  tortuous but good thrill and bruit-  also right sided TDC  Assessment/Plan: 79 year old female with copd and ESRD presenting with weakness and an increased O2 requirement 1 Hypoxia- has a baseline O2 req-  Now increased O2 req  also with pleural effusions.  Had been getting to EDW as OP but with small goals.  Will plan for HD with UF challenge to see if can help her SOB 2 ESRD: normally MWF at [facility] via [facility]-  Has only been on HD since August.  Will plan for HD tomorrow (tuesdat)off schedule 3 Hypertension: hypertensive and by the appearance of CXR is volume overloaded.  Will try for UF with HD   4. Anemia of ESRD: has been on tx with mircera and venofer at OP center-  hgb was at 8.1-- now over 9 so dont think that is all the issue for the SOB 5. Metabolic Bone Disease: is not on vitamin D or sensipar at OP unit.  No binder on OP med list      Louis Meckel 10/21/2020, 11:00 PM

## 2020-10-22 ENCOUNTER — Encounter (HOSPITAL_COMMUNITY): Payer: Self-pay | Admitting: Internal Medicine

## 2020-10-22 DIAGNOSIS — J810 Acute pulmonary edema: Secondary | ICD-10-CM | POA: Diagnosis not present

## 2020-10-22 DIAGNOSIS — E8770 Fluid overload, unspecified: Secondary | ICD-10-CM | POA: Diagnosis not present

## 2020-10-22 DIAGNOSIS — D631 Anemia in chronic kidney disease: Secondary | ICD-10-CM | POA: Diagnosis not present

## 2020-10-22 DIAGNOSIS — R531 Weakness: Secondary | ICD-10-CM | POA: Diagnosis not present

## 2020-10-22 DIAGNOSIS — N250 Renal osteodystrophy: Secondary | ICD-10-CM | POA: Diagnosis not present

## 2020-10-22 DIAGNOSIS — N186 End stage renal disease: Secondary | ICD-10-CM

## 2020-10-22 DIAGNOSIS — J811 Chronic pulmonary edema: Secondary | ICD-10-CM | POA: Diagnosis not present

## 2020-10-22 DIAGNOSIS — Z992 Dependence on renal dialysis: Secondary | ICD-10-CM | POA: Diagnosis not present

## 2020-10-22 DIAGNOSIS — J9601 Acute respiratory failure with hypoxia: Secondary | ICD-10-CM | POA: Diagnosis not present

## 2020-10-22 DIAGNOSIS — I10 Essential (primary) hypertension: Secondary | ICD-10-CM

## 2020-10-22 DIAGNOSIS — I120 Hypertensive chronic kidney disease with stage 5 chronic kidney disease or end stage renal disease: Secondary | ICD-10-CM | POA: Diagnosis not present

## 2020-10-22 LAB — HEPATIC FUNCTION PANEL
ALT: 21 U/L (ref 0–44)
AST: 32 U/L (ref 15–41)
Albumin: 2.3 g/dL — ABNORMAL LOW (ref 3.5–5.0)
Alkaline Phosphatase: 69 U/L (ref 38–126)
Bilirubin, Direct: 0.3 mg/dL — ABNORMAL HIGH (ref 0.0–0.2)
Indirect Bilirubin: 0.8 mg/dL (ref 0.3–0.9)
Total Bilirubin: 1.1 mg/dL (ref 0.3–1.2)
Total Protein: 6 g/dL — ABNORMAL LOW (ref 6.5–8.1)

## 2020-10-22 LAB — BASIC METABOLIC PANEL
Anion gap: 12 (ref 5–15)
BUN: 31 mg/dL — ABNORMAL HIGH (ref 8–23)
CO2: 26 mmol/L (ref 22–32)
Calcium: 9.3 mg/dL (ref 8.9–10.3)
Chloride: 98 mmol/L (ref 98–111)
Creatinine, Ser: 6.76 mg/dL — ABNORMAL HIGH (ref 0.44–1.00)
GFR, Estimated: 6 mL/min — ABNORMAL LOW (ref >60)
Glucose, Bld: 98 mg/dL (ref 70–99)
Potassium: 4.4 mmol/L (ref 3.5–5.1)
Sodium: 136 mmol/L (ref 135–145)

## 2020-10-22 LAB — CBC
HCT: 30.1 % — ABNORMAL LOW (ref 36.0–46.0)
Hemoglobin: 9.5 g/dL — ABNORMAL LOW (ref 12.0–15.0)
MCH: 31 pg (ref 26.0–34.0)
MCHC: 31.6 g/dL (ref 30.0–36.0)
MCV: 98.4 fL (ref 80.0–100.0)
Platelets: 298 10*3/uL (ref 150–400)
RBC: 3.06 MIL/uL — ABNORMAL LOW (ref 3.87–5.11)
RDW: 18.9 % — ABNORMAL HIGH (ref 11.5–15.5)
WBC: 16.1 10*3/uL — ABNORMAL HIGH (ref 4.0–10.5)
nRBC: 0 % (ref 0.0–0.2)

## 2020-10-22 MED ORDER — CARVEDILOL 12.5 MG PO TABS
12.5000 mg | ORAL_TABLET | Freq: Two times a day (BID) | ORAL | Status: DC
  Administered 2020-10-22 – 2020-10-27 (×10): 12.5 mg via ORAL
  Filled 2020-10-22 (×11): qty 1

## 2020-10-22 MED ORDER — AMLODIPINE BESYLATE 10 MG PO TABS
10.0000 mg | ORAL_TABLET | Freq: Every day | ORAL | Status: DC
  Administered 2020-10-22 – 2020-10-27 (×6): 10 mg via ORAL
  Filled 2020-10-22 (×6): qty 1

## 2020-10-22 MED ORDER — HEPARIN SODIUM (PORCINE) 5000 UNIT/ML IJ SOLN
5000.0000 [IU] | Freq: Three times a day (TID) | INTRAMUSCULAR | Status: DC
  Administered 2020-10-22 – 2020-11-06 (×40): 5000 [IU] via SUBCUTANEOUS
  Filled 2020-10-22 (×37): qty 1

## 2020-10-22 MED ORDER — IRBESARTAN 300 MG PO TABS
300.0000 mg | ORAL_TABLET | Freq: Every day | ORAL | Status: DC
  Administered 2020-10-22 – 2020-10-27 (×6): 300 mg via ORAL
  Filled 2020-10-22 (×7): qty 1

## 2020-10-22 MED ORDER — HEPARIN SODIUM (PORCINE) 1000 UNIT/ML IJ SOLN
INTRAMUSCULAR | Status: AC
  Filled 2020-10-22: qty 4

## 2020-10-22 MED ORDER — POLYETHYLENE GLYCOL 3350 17 G PO PACK: 17.0000 g | PACK | Freq: Every day | ORAL | Status: DC | PRN

## 2020-10-22 MED ORDER — ROSUVASTATIN CALCIUM 5 MG PO TABS
10.0000 mg | ORAL_TABLET | Freq: Every day | ORAL | Status: DC
  Administered 2020-10-22 – 2020-11-06 (×14): 10 mg via ORAL
  Filled 2020-10-22 (×14): qty 2

## 2020-10-22 MED ORDER — ACETAMINOPHEN 325 MG PO TABS
650.0000 mg | ORAL_TABLET | Freq: Four times a day (QID) | ORAL | Status: DC | PRN
  Administered 2020-10-24 – 2020-10-25 (×2): 650 mg via ORAL
  Filled 2020-10-22 (×2): qty 2

## 2020-10-22 MED ORDER — ACETAMINOPHEN 650 MG RE SUPP: 650.0000 mg | Freq: Four times a day (QID) | RECTAL | Status: DC | PRN

## 2020-10-22 NOTE — Progress Notes (Signed)
Patient returned from HD, VSS ex )2. Pt desat in the high 70s on 4L with activity but return to the 90s with rest on 4L.  No other distress voice. Will continue to monitor.

## 2020-10-22 NOTE — Evaluation (Signed)
Physical Therapy Evaluation Patient Details Name: Cassandra Dyer MRN: [id number] DOB: 1941/09/09 Today's Date: 10/22/2020   History of Present Illness  Pt is a 79 y/o female admitted secondary to worsening SOB. Found to have pulmonary edema. PMH includes HTN, R kidney carcinoma s/p nephrectomy, COPD on 2L, and ESRD on HD.   Clinical Impression  Pt admitted secondary to problem above with deficits below. Pt requiring supervision to sit EOB. Upon sitting, pt's oxygen sats dropping to 70% on 4L. Bumped oxygen up to 5L and oxygen remained the same, so returned to supine. Took ~2-3 mins to return to 90-91% on 5L. RN notified and in room. Anticipate pt will progress well once symptoms improve, however, will continue to update recommendations appropriately. Will continue to follow acutely.     Follow Up Recommendations Home health PT;Supervision for mobility/OOB (pending progression )    Equipment Recommendations  Other (comment) (TBD, may benefit from rollator)    Recommendations for Other Services       Precautions / Restrictions Precautions Precautions: Fall;Other (comment) Precaution Comments: watch O2 sats Restrictions Weight Bearing Restrictions: No      Mobility  Bed Mobility Overal bed mobility: Needs Assistance Bed Mobility: Supine to Sit;Sit to Supine     Supine to sit: Supervision Sit to supine: Supervision   General bed mobility comments: Supervision for safety. Upon sitting, pt's sats dropping to 70% with good waveform on 4L. Bumped up to 5L and sats remained the same. Returned to supine and took ~2-3 mins to return to 90-91% on 5L.     Transfers                    Ambulation/Gait                Stairs            Wheelchair Mobility    Modified Rankin (Stroke Patients Only)       Balance Overall balance assessment: Needs assistance Sitting-balance support: No upper extremity supported;Feet supported Sitting balance-Leahy Scale: Fair                                        Pertinent Vitals/Pain Pain Assessment: No/denies pain    Home Living Family/patient expects to be discharged to:: Private residence Living Arrangements: Non-relatives/Friends Available Help at Discharge: Friend(s);Available 24 hours/day Type of Home: House Home Access: Level entry     Home Layout: One level Home Equipment: None      Prior Function Level of Independence: Independent               Hand Dominance        Extremity/Trunk Assessment   Upper Extremity Assessment Upper Extremity Assessment: Defer to OT evaluation    Lower Extremity Assessment Lower Extremity Assessment: Generalized weakness (increased swelling noted bilaterally )    Cervical / Trunk Assessment Cervical / Trunk Assessment: Normal  Communication   Communication: No difficulties  Cognition Arousal/Alertness: Awake/alert Behavior During Therapy: WFL for tasks assessed/performed Overall Cognitive Status: Within Functional Limits for tasks assessed                                        General Comments      Exercises     Assessment/Plan    PT Assessment Patient needs  continued PT services  PT Problem List Cardiopulmonary status limiting activity;Decreased strength;Decreased balance;Decreased activity tolerance;Decreased mobility;Decreased knowledge of use of DME;Decreased knowledge of precautions       PT Treatment Interventions Gait training;DME instruction;Therapeutic activities;Functional mobility training;Therapeutic exercise;Balance training;Patient/family education    PT Goals (Current goals can be found in the Care Plan section)  Acute Rehab PT Goals Patient Stated Goal: to feel better PT Goal Formulation: With patient Time For Goal Achievement: 11/05/20 Potential to Achieve Goals: Fair    Frequency Min 3X/week   Barriers to discharge        Co-evaluation               AM-PAC PT "6  Clicks" Mobility  Outcome Measure Help needed turning from your back to your side while in a flat bed without using bedrails?: None Help needed moving from lying on your back to sitting on the side of a flat bed without using bedrails?: None Help needed moving to and from a bed to a chair (including a wheelchair)?: A Little Help needed standing up from a chair using your arms (e.g., wheelchair or bedside chair)?: A Little Help needed to walk in hospital room?: A Lot Help needed climbing 3-5 steps with a railing? : A Lot 6 Click Score: 18    End of Session Equipment Utilized During Treatment: Gait belt Activity Tolerance: Treatment limited secondary to medical complications (Comment) (decreased oxygen sats) Patient left: in bed;with call bell/phone within reach Nurse Communication: Mobility status;Other (comment) (low oxygen sats) PT Visit Diagnosis: Other abnormalities of gait and mobility (R26.89);Difficulty in walking, not elsewhere classified (R26.2)    Time: 9784-7841 PT Time Calculation (min) (ACUTE ONLY): 21 min   Charges:   PT Evaluation $PT Eval Moderate Complexity: 1 Mod          Reuel Derby, PT, DPT  Acute Rehabilitation Services  Pager: [phone] Office: [phone]   Rudean Hitt 10/22/2020, 10:08 AM

## 2020-10-22 NOTE — H&P (Signed)
History and Physical    Cassandra Dyer DHR:[id number] DOB: [birth year] DOA: 10/21/2020  PCP: Biagio Borg, MD  Patient coming from: Home.  Chief Complaint: Shortness of breath.  HPI: Cassandra Dyer is a 79 y.o. female with history of renal cell carcinoma status post nephrectomy progressed to ESRD has been on dialysis since August presents to the ER because of shortness of breath and weakness and had missed her dialysis yesterday because of weakness.  Denies any chest pain productive cough fever chills.  Had one episode of vomiting 3 days ago and since then has been a poor appetite but denies any abdominal pain.  ED Course: In the ER patient was hypoxic usually patient is on 2 L oxygen for COPD was requiring 4 L to maintain sats with chest x-ray showing bilateral pleural effusion and interstitial edema.  Blood pressure was mildly elevated.  Labs show hemoglobin around 9.5 which is at at baseline and EKG shows normal sinus rhythm with Covid test negative.  On-call nephrologist was consulted and patient admitted for pulmonary mass secondary to missed dialysis.  Review of Systems: As per HPI, rest all negative.   Past Medical History:  Diagnosis Date   Anemia    Hepatitis    hx of hep C - 10-20 years ago    History of blood transfusion    History of kidney cancer    Hyperlipidemia    Hypertension    not taken b/p med in 2-3 years md deceased never went to another md     Past Surgical History:  Procedure Laterality Date   AV FISTULA PLACEMENT Left 08/07/2020   Procedure: LEFT ARM ARTERIOVENOUS (AV) FISTULA CREATION;  Surgeon: [name], MD;  Location: [facility];  Service: Vascular;  Laterality: Left;   CATARACT EXTRACTION Bilateral    11/30 left /12/9 right   IR FLUORO GUIDE CV LINE RIGHT  07/27/2020   IR US GUIDE VASC ACCESS RIGHT  07/27/2020   ROBOT ASSISTED LAPAROSCOPIC NEPHRECTOMY Right 01/13/2017   Procedure: XI ROBOTIC ASSISTED LAPAROSCOPIC RADICAL NEPHRECTOMY WITH  LYSIS OF ADHESION;  Surgeon: Alexis Frock, MD;  Location: [facility];  Service: Urology;  Laterality: Right;     reports that she quit smoking about 19 years ago. Her smoking use included cigarettes. She has a 15.00 pack-year smoking history. She has never used smokeless tobacco. She reports current alcohol use. She reports that she does not use drugs.  No Known Allergies  Family History  Problem Relation Age of Onset   Hypertension Mother    Pancreatic cancer Father    Breast cancer Sister     Prior to Admission medications   Medication Sig Start Date End Date Taking? Authorizing Provider  amLODipine (NORVASC) 10 MG tablet Take 1 tablet (10 mg total) by mouth daily. 04/19/20  Yes Biagio Borg, MD  ascorbic acid (VITAMIN C) 500 MG tablet Take 500 mg by mouth 2 (two) times daily.  08/31/20  Yes [provider]  Black Currant Seed Oil 500 MG CAPS Take 500 mg by mouth every morning.    Yes [provider]  Calcium Carbonate-Vit D-Min (CALCIUM 1200 PO) Take 1,200 mg by mouth every morning.    Yes [provider]  carvedilol (COREG) 12.5 MG tablet Take 1 tablet (12.5 mg total) by mouth 2 (two) times daily with a meal. 08/08/20  Yes Georgette Shell, MD  Cholecalciferol (VITAMIN D3) 50 MCG (2000 UT) capsule Take 2,000 Units by mouth every morning.  08/31/20  Yes [provider]  Garlic 1093 MG CAPS Take 2,000 mg by mouth daily.    Yes [provider]  Multiple Vitamin (MULTIVITAMIN WITH MINERALS) TABS tablet Take 1 tablet by mouth every morning.    Yes [provider]  Omega-3 Fatty Acids (FISH OIL PO) Take 1 capsule by mouth every morning.    Yes [provider]  ondansetron (ZOFRAN) 4 MG tablet Take 1 tablet (4 mg total) by mouth every 8 (eight) hours as needed for nausea or vomiting. 04/19/20  Yes Biagio Borg, MD  OVER THE COUNTER MEDICATION Take 1 capsule by mouth daily. Mega juice   Yes [provider]  polyethylene  glycol (MIRALAX / GLYCOLAX) 17 g packet Take 17 g by mouth daily as needed for mild constipation. 09/12/20  Yes Domenic Polite, MD  rosuvastatin (CRESTOR) 10 MG tablet Take 1 tablet (10 mg total) by mouth daily. 09/26/20  Yes Biagio Borg, MD  benzonatate (TESSALON) 200 MG capsule Take 1 capsule (200 mg total) by mouth 3 (three) times daily as needed. Patient not taking: Reported on 10/10/2020 07/22/20   Marrian Salvage, FNP  metoprolol succinate (TOPROL-XL) 100 MG 24 hr tablet Take 100 mg by mouth daily. 10/15/20   [provider]  senna-docusate (SENOKOT-S) 8.6-50 MG tablet Take 1 tablet by mouth daily as needed for mild constipation. Patient not taking: Reported on 10/21/2020 09/12/20   Domenic Polite, MD  telmisartan (MICARDIS) 80 MG tablet Take 80 mg by mouth daily. 10/15/20   [provider]    Physical Exam: Constitutional: Moderately built and nourished. Vitals:   10/21/20 2130 10/21/20 2145 10/21/20 2200 10/21/20 2215  BP: (!) 163/67 (!) 162/70 (!) 154/64 (!) 160/69  Pulse: 81 85 79 80  Resp: (!) 28 18 20  (!) 21  Temp:      TempSrc:      SpO2: 97% 97% 96% 98%  Weight:       Eyes: Anicteric no pallor. ENMT: No discharge from the ears eyes nose or mouth. Neck: No mass felt.  No JVD appreciated. Respiratory: No rhonchi or crepitations. Cardiovascular: S1-S2 heard. Abdomen: Soft nontender bowel sounds present. Musculoskeletal: No edema. Skin: No rash. Neurologic: Alert awake oriented to time place and person.  Moves all extremities. Psychiatric: Appears normal.  Normal affect.   Labs on Admission: I have personally reviewed following labs and imaging studies  CBC: Recent Labs  Lab 10/21/20 1827  WBC 15.4*  HGB 9.2*  HCT 31.5*  MCV 99.7  PLT 235   Basic Metabolic Panel: Recent Labs  Lab 10/21/20 1827  NA 133*  K 4.3  CL 95*  CO2 24  GLUCOSE 165*  BUN 28*  CREATININE 6.42*  CALCIUM 9.2   GFR: Estimated Creatinine Clearance: 5.8  mL/min (A) (by C-G formula based on SCr of 6.42 mg/dL (H)). Liver Function Tests: No results for input(s): AST, ALT, ALKPHOS, BILITOT, PROT, ALBUMIN in the last 168 hours. No results for input(s): LIPASE, AMYLASE in the last 168 hours. No results for input(s): AMMONIA in the last 168 hours. Coagulation Profile: No results for input(s): INR, PROTIME in the last 168 hours. Cardiac Enzymes: No results for input(s): CKTOTAL, CKMB, CKMBINDEX, TROPONINI in the last 168 hours. BNP (last 3 results) No results for input(s): PROBNP in the last 8760 hours. HbA1C: No results for input(s): HGBA1C in the last 72 hours. CBG: Recent Labs  Lab 10/21/20 2010  GLUCAP 109*   Lipid Profile: No results for  input(s): CHOL, HDL, LDLCALC, TRIG, CHOLHDL, LDLDIRECT in the last 72 hours. Thyroid Function Tests: No results for input(s): TSH, T4TOTAL, FREET4, T3FREE, THYROIDAB in the last 72 hours. Anemia Panel: No results for input(s): VITAMINB12, FOLATE, FERRITIN, TIBC, IRON, RETICCTPCT in the last 72 hours. Urine analysis:    Component Value Date/Time   COLORURINE YELLOW 07/28/2020 1131   APPEARANCEUR HAZY (A) 07/28/2020 1131   LABSPEC 1.016 07/28/2020 1131   PHURINE 5.0 07/28/2020 1131   GLUCOSEU NEGATIVE 07/28/2020 1131   GLUCOSEU NEGATIVE 04/26/2020 0909   HGBUR NEGATIVE 07/28/2020 1131   BILIRUBINUR NEGATIVE 07/28/2020 1131   KETONESUR NEGATIVE 07/28/2020 1131   PROTEINUR NEGATIVE 07/28/2020 1131   UROBILINOGEN 0.2 04/26/2020 0909   NITRITE NEGATIVE 07/28/2020 1131   LEUKOCYTESUR LARGE (A) 07/28/2020 1131   Sepsis Labs: @LABRCNTIP (procalcitonin:4,lacticidven:4) ) Recent Results (from the past 240 hour(s))  Respiratory Panel by RT PCR (Flu A&B, Covid) - Nasopharyngeal Swab     Status: None   Collection Time: 10/21/20  6:04 PM   Specimen: Nasopharyngeal Swab  Result Value Ref Range Status   SARS Coronavirus 2 by RT PCR NEGATIVE NEGATIVE Final    Comment: (NOTE) SARS-CoV-2 target nucleic  acids are NOT DETECTED.  The SARS-CoV-2 RNA is generally detectable in upper respiratoy specimens during the acute phase of infection. The lowest concentration of SARS-CoV-2 viral copies this assay can detect is 131 copies/mL. A negative result does not preclude SARS-Cov-2 infection and should not be used as the sole basis for treatment or other patient management decisions. A negative result may occur with  improper specimen collection/handling, submission of specimen other than nasopharyngeal swab, presence of viral mutation(s) within the areas targeted by this assay, and inadequate number of viral copies (<131 copies/mL). A negative result must be combined with clinical observations, patient history, and epidemiological information. The expected result is Negative.  Fact Sheet for Patients:  PinkCheek.be  Fact Sheet for Healthcare Providers:  GravelBags.it  This test is no t yet approved or cleared by the [place] FDA and  has been authorized for detection and/or diagnosis of SARS-CoV-2 by FDA under an Emergency Use Authorization (EUA). This EUA will remain  in effect (meaning this test can be used) for the duration of the COVID-19 declaration under Section 564(b)(1) of the Act, 21 U.S.C. section 360bbb-3(b)(1), unless the authorization is terminated or revoked sooner.     Influenza A by PCR NEGATIVE NEGATIVE Final   Influenza B by PCR NEGATIVE NEGATIVE Final    Comment: (NOTE) The Xpert Xpress SARS-CoV-2/FLU/RSV assay is intended as an aid in  the diagnosis of influenza from Nasopharyngeal swab specimens and  should not be used as a sole basis for treatment. Nasal washings and  aspirates are unacceptable for Xpert Xpress SARS-CoV-2/FLU/RSV  testing.  Fact Sheet for Patients: PinkCheek.be  Fact Sheet for Healthcare Providers: GravelBags.it  This test  is not yet approved or cleared by the [place] FDA and  has been authorized for detection and/or diagnosis of SARS-CoV-2 by  FDA under an Emergency Use Authorization (EUA). This EUA will remain  in effect (meaning this test can be used) for the duration of the  Covid-19 declaration under Section 564(b)(1) of the Act, 21  U.S.C. section 360bbb-3(b)(1), unless the authorization is  terminated or revoked. Performed at [facility], [place] [address], [place], [place] [address]      Radiological Exams on Admission: DG Chest 2 View  Result Date: 10/21/2020 CLINICAL DATA:  79 year old female with shortness  of breath and increased weakness. Missed hemodialysis today. EXAM: CHEST - 2 VIEW COMPARISON:  Portable chest 09/10/2020 and earlier. FINDINGS: AP and lateral views of the chest today. Stable right chest tunneled dialysis catheter. Moderate bilateral pleural effusions are increased since September, resembling similar effusions in August. Upper lung vascular congestion. Most mediastinal contours are obscured today. Visualized tracheal air column is within normal limits. No pneumothorax. No acute osseous abnormality identified. Abdominal Calcified aortic atherosclerosis. Paucity of bowel gas in the upper abdomen. IMPRESSION: Moderate bilateral pleural effusions are increased since September, with superimposed mild or developing pulmonary interstitial edema. Electronically Signed   By: Genevie Ann M.D.   On: 10/21/2020 18:31    EKG: Independently reviewed.  Normal sinus rhythm.  Assessment/Plan Principal Problem:   Pulmonary edema Active Problems:   Essential hypertension   Renal cell carcinoma of right kidney (HCC)   ESRD (end stage renal disease) (HCC)   COPD (chronic obstructive pulmonary disease) (HCC)   Acute pulmonary edema (HCC)    1. Acute pulmonary edema secondary to missing dialysis patient usually is on 2 L oxygen presently on 4 L.  Appreciate nephrology consult planning to do  dialysis.  I think patient's respiratory status will improve with dialysis. 2. Hypertension uncontrolled continue home medications including amlodipine metoprolol and ARB.  I think blood pressure will improve with dialysis. 3. COPD not actively wheezing. 4. Chronic anemia follow CBC.  Hemoglobin at baseline. 5. History of renal cell carcinoma status post nephrectomy. 6. Poor appetite and weakness.  Will check LFTs abdomen appears benign.  Continue to observe.   DVT prophylaxis: Heparin. Code Status: Full code. Family Communication: Discussed with patient. Disposition Plan: Home. Consults called: Nephrology. Admission status: Observation.   Rise Patience MD [facility] Pager [phone].  If 7PM-7AM, please contact night-coverage [email or web address] Password [email or web address] [facility]  10/22/2020, 12:48 AM

## 2020-10-22 NOTE — Progress Notes (Signed)
TELE monitor applied and confirmed with CCMD tech, Shay.

## 2020-10-22 NOTE — Progress Notes (Signed)
[facility] Progress Note   Subjective:   Just started dialysis - no new complaints.    Objective Vitals:   10/22/20 0825 10/22/20 1101 10/22/20 1105 10/22/20 1111  BP:  (!) 122/56 (!) 120/50   Pulse:      Resp:  20 (!) 21 19  Temp:      TempSrc:  Oral    SpO2: 92%     Weight:  64.8 kg     Physical Exam General: frail, nontoxic appearing Heart: RRR Lungs: sl ^ WOB with talking, dec BS bases Abdomen: soft Extremities: 1+ LE edema Dialysis Access:  RIJ TDC c/d/i, LUE AVF +t/b but small and some collaterals present  Additional Objective Labs: Basic Metabolic Panel: Recent Labs  Lab 10/21/20 1827 10/22/20 0130  NA 133* 136  K 4.3 4.4  CL 95* 98  CO2 24 26  GLUCOSE 165* 98  BUN 28* 31*  CREATININE 6.42* 6.76*  CALCIUM 9.2 9.3   Liver Function Tests: Recent Labs  Lab 10/22/20 0130  AST 32  ALT 21  ALKPHOS 69  BILITOT 1.1  PROT 6.0*  ALBUMIN 2.3*   No results for input(s): LIPASE, AMYLASE in the last 168 hours. CBC: Recent Labs  Lab 10/21/20 1827 10/22/20 0130  WBC 15.4* 16.1*  HGB 9.2* 9.5*  HCT 31.5* 30.1*  MCV 99.7 98.4  PLT 303 298   Blood Culture    Component Value Date/Time   SDES BLOOD SITE NOT SPECIFIED 09/10/2020 1414   SPECREQUEST  09/10/2020 1414    BOTTLES DRAWN AEROBIC AND ANAEROBIC Blood Culture adequate volume   CULT  09/10/2020 1414    NO GROWTH 5 DAYS Performed at [facility], [place] [address] [place], [place] [address]    REPTSTATUS 09/15/2020 FINAL 09/10/2020 1414    Cardiac Enzymes: No results for input(s): CKTOTAL, CKMB, CKMBINDEX, TROPONINI in the last 168 hours. CBG: Recent Labs  Lab 10/21/20 2010  GLUCAP 109*   Iron Studies: No results for input(s): IRON, TIBC, TRANSFERRIN, FERRITIN in the last 72 hours. @lablastinr3 @ Studies/Results: DG Chest 2 View  Result Date: 10/21/2020 CLINICAL DATA:  79 year old female with shortness of breath and increased weakness. Missed hemodialysis today. EXAM:  CHEST - 2 VIEW COMPARISON:  Portable chest 09/10/2020 and earlier. FINDINGS: AP and lateral views of the chest today. Stable right chest tunneled dialysis catheter. Moderate bilateral pleural effusions are increased since September, resembling similar effusions in August. Upper lung vascular congestion. Most mediastinal contours are obscured today. Visualized tracheal air column is within normal limits. No pneumothorax. No acute osseous abnormality identified. Abdominal Calcified aortic atherosclerosis. Paucity of bowel gas in the upper abdomen. IMPRESSION: Moderate bilateral pleural effusions are increased since September, with superimposed mild or developing pulmonary interstitial edema. Electronically Signed   By: Genevie Ann M.D.   On: 10/21/2020 18:31   Medications:  . amLODipine  10 mg Oral Daily  . carvedilol  12.5 mg Oral BID WC  . Chlorhexidine Gluconate Cloth  6 each Topical Q0600  . heparin  5,000 Units Subcutaneous Q8H  . irbesartan  300 mg Oral Daily  . rosuvastatin  10 mg Oral Daily    Dialysis Orders: Dialyzes at [facility] MWF 4 hours  EDW 62- getting there. HD [facility] 2/2.5, Dialyzer 180, Heparin no. Access [facility]- has left AVF that can be used after 11/25. mircera100 q 2 weeks, venofer 100 q tx right now 11/3 hgb 8.3 11/1 k 4  Assessment/Plan: 79 year old female with copd and ESRD  presenting with weakness and an increased O2 requirement 1 Hypoxia- has a baseline O2 req-  Now increased O2 req  also with pleural effusions.  Had been getting to EDW as OP but with small goals --> reports poor appetite and I suspect she's lost some lean body weight and replaced with edema.  On HD now with UF challenge to see if can help her SOB - needs post wt.  2 ESRD: normally MWF at [facility] via [facility]-  Has only been on HD since August.  Missed Mon, on HD now, resume usual schedule tomorrow. 3 Hypertension: hypertensive and by the appearance of CXR is volume overloaded.  Will try for UF with HD   4. Anemia of ESRD: has  been on tx with mircera and venofer at OP center-  hgb was at 8.1-- now over 9 so dont think that is all the issue for the SOB 5. Metabolic Bone Disease: is not on vitamin D or sensipar at OP unit.  No binder on OP med list   If symptoms improved after HD would discharge her and allow her to attend outpt treatment tomorrow; we will communicate a new dry weight to her outpt unit if this is the case.  Jannifer Hick MD 10/22/2020, 11:34 AM  [facility] Pager: [phone]

## 2020-10-22 NOTE — Progress Notes (Addendum)
Patient is a 79 year old female with history of ESRD on dialysis renal cell carcinoma of right kidney status post nephrectomy who presented from home with complaints of shortness of breath, weakness and missed her dialysis session on Monday because of that.  She was on oxygen at 2 L at home at her baseline for her COPD but was requiring 4 L to maintain her saturation. On presentation, chest x-ray showed bilateral pleural effusions and interstitial edema.  Patient was admitted for the management of acute hypoxic respiratory failure secondary to volume overload/pulmonary edema/pleural effusion.  Nephrology consulted and she underwent dialysis today.  She has a permacath on her right chest. Patient seen and examined at the bedside this morning.  During my evaluation, she was hemodynamically stable but looked  severely volume overloaded.  Chest auscultation revealed bilateral decreased air entry but no wheezes or crackles. I have discussed with nephrology this afternoon and the plan is to do another session of dialysis tomorrow morning.  After that, hopefully she can be discharged to home. She has mild leukocytosis, low suspicion for infectious etiology and likely reactive.  No fever, no pneumonia as per chest x-ray.  Will check CBC tomorrow.Will also repeat chest x-ray tomorrow morning for follow-up on pleural effusions. Patient seen by physical therapy and recommended home health on discharge. Patient seen by Dr. Hal Hope this morning.  I agree with his assessment and plan.

## 2020-10-23 ENCOUNTER — Observation Stay (HOSPITAL_COMMUNITY): Payer: Medicare Other

## 2020-10-23 ENCOUNTER — Telehealth: Payer: Self-pay | Admitting: Internal Medicine

## 2020-10-23 DIAGNOSIS — J811 Chronic pulmonary edema: Secondary | ICD-10-CM | POA: Diagnosis not present

## 2020-10-23 DIAGNOSIS — I361 Nonrheumatic tricuspid (valve) insufficiency: Secondary | ICD-10-CM | POA: Diagnosis not present

## 2020-10-23 DIAGNOSIS — J9811 Atelectasis: Secondary | ICD-10-CM | POA: Diagnosis not present

## 2020-10-23 DIAGNOSIS — J449 Chronic obstructive pulmonary disease, unspecified: Secondary | ICD-10-CM

## 2020-10-23 DIAGNOSIS — Z992 Dependence on renal dialysis: Secondary | ICD-10-CM | POA: Diagnosis not present

## 2020-10-23 DIAGNOSIS — R112 Nausea with vomiting, unspecified: Secondary | ICD-10-CM | POA: Diagnosis present

## 2020-10-23 DIAGNOSIS — R531 Weakness: Secondary | ICD-10-CM | POA: Diagnosis not present

## 2020-10-23 DIAGNOSIS — M810 Age-related osteoporosis without current pathological fracture: Secondary | ICD-10-CM | POA: Diagnosis present

## 2020-10-23 DIAGNOSIS — E871 Hypo-osmolality and hyponatremia: Secondary | ICD-10-CM | POA: Diagnosis present

## 2020-10-23 DIAGNOSIS — R634 Abnormal weight loss: Secondary | ICD-10-CM | POA: Diagnosis not present

## 2020-10-23 DIAGNOSIS — E8770 Fluid overload, unspecified: Secondary | ICD-10-CM | POA: Diagnosis present

## 2020-10-23 DIAGNOSIS — N186 End stage renal disease: Secondary | ICD-10-CM | POA: Diagnosis present

## 2020-10-23 DIAGNOSIS — Z87891 Personal history of nicotine dependence: Secondary | ICD-10-CM | POA: Diagnosis not present

## 2020-10-23 DIAGNOSIS — C641 Malignant neoplasm of right kidney, except renal pelvis: Secondary | ICD-10-CM

## 2020-10-23 DIAGNOSIS — J9601 Acute respiratory failure with hypoxia: Secondary | ICD-10-CM | POA: Diagnosis not present

## 2020-10-23 DIAGNOSIS — I10 Essential (primary) hypertension: Secondary | ICD-10-CM | POA: Diagnosis not present

## 2020-10-23 DIAGNOSIS — E785 Hyperlipidemia, unspecified: Secondary | ICD-10-CM | POA: Diagnosis present

## 2020-10-23 DIAGNOSIS — I872 Venous insufficiency (chronic) (peripheral): Secondary | ICD-10-CM | POA: Diagnosis present

## 2020-10-23 DIAGNOSIS — R54 Age-related physical debility: Secondary | ICD-10-CM | POA: Diagnosis present

## 2020-10-23 DIAGNOSIS — J90 Pleural effusion, not elsewhere classified: Secondary | ICD-10-CM | POA: Diagnosis not present

## 2020-10-23 DIAGNOSIS — J810 Acute pulmonary edema: Secondary | ICD-10-CM | POA: Diagnosis not present

## 2020-10-23 DIAGNOSIS — I517 Cardiomegaly: Secondary | ICD-10-CM | POA: Diagnosis not present

## 2020-10-23 DIAGNOSIS — Z20822 Contact with and (suspected) exposure to covid-19: Secondary | ICD-10-CM | POA: Diagnosis present

## 2020-10-23 DIAGNOSIS — R197 Diarrhea, unspecified: Secondary | ICD-10-CM | POA: Diagnosis not present

## 2020-10-23 DIAGNOSIS — N250 Renal osteodystrophy: Secondary | ICD-10-CM | POA: Diagnosis not present

## 2020-10-23 DIAGNOSIS — R0602 Shortness of breath: Secondary | ICD-10-CM | POA: Diagnosis not present

## 2020-10-23 DIAGNOSIS — Z85528 Personal history of other malignant neoplasm of kidney: Secondary | ICD-10-CM | POA: Diagnosis not present

## 2020-10-23 DIAGNOSIS — R918 Other nonspecific abnormal finding of lung field: Secondary | ICD-10-CM | POA: Diagnosis not present

## 2020-10-23 DIAGNOSIS — Z905 Acquired absence of kidney: Secondary | ICD-10-CM | POA: Diagnosis not present

## 2020-10-23 DIAGNOSIS — C3432 Malignant neoplasm of lower lobe, left bronchus or lung: Secondary | ICD-10-CM | POA: Diagnosis present

## 2020-10-23 DIAGNOSIS — D631 Anemia in chronic kidney disease: Secondary | ICD-10-CM | POA: Diagnosis not present

## 2020-10-23 DIAGNOSIS — E8889 Other specified metabolic disorders: Secondary | ICD-10-CM | POA: Diagnosis present

## 2020-10-23 DIAGNOSIS — J9690 Respiratory failure, unspecified, unspecified whether with hypoxia or hypercapnia: Secondary | ICD-10-CM | POA: Diagnosis not present

## 2020-10-23 DIAGNOSIS — J9621 Acute and chronic respiratory failure with hypoxia: Secondary | ICD-10-CM | POA: Diagnosis present

## 2020-10-23 DIAGNOSIS — J910 Malignant pleural effusion: Secondary | ICD-10-CM | POA: Diagnosis not present

## 2020-10-23 DIAGNOSIS — R590 Localized enlarged lymph nodes: Secondary | ICD-10-CM | POA: Diagnosis present

## 2020-10-23 DIAGNOSIS — I120 Hypertensive chronic kidney disease with stage 5 chronic kidney disease or end stage renal disease: Secondary | ICD-10-CM | POA: Diagnosis present

## 2020-10-23 DIAGNOSIS — Z9889 Other specified postprocedural states: Secondary | ICD-10-CM | POA: Diagnosis not present

## 2020-10-23 LAB — RENAL FUNCTION PANEL
Albumin: 1.8 g/dL — ABNORMAL LOW (ref 3.5–5.0)
Anion gap: 9 (ref 5–15)
BUN: 14 mg/dL (ref 8–23)
CO2: 28 mmol/L (ref 22–32)
Calcium: 8.6 mg/dL — ABNORMAL LOW (ref 8.9–10.3)
Chloride: 99 mmol/L (ref 98–111)
Creatinine, Ser: 4.08 mg/dL — ABNORMAL HIGH (ref 0.44–1.00)
GFR, Estimated: 11 mL/min — ABNORMAL LOW (ref >60)
Glucose, Bld: 93 mg/dL (ref 70–99)
Phosphorus: 2.2 mg/dL — ABNORMAL LOW (ref 2.5–4.6)
Potassium: 3.2 mmol/L — ABNORMAL LOW (ref 3.5–5.1)
Sodium: 136 mmol/L (ref 135–145)

## 2020-10-23 LAB — CBC WITH DIFFERENTIAL/PLATELET
Abs Immature Granulocytes: 0.08 10*3/uL — ABNORMAL HIGH (ref 0.00–0.07)
Basophils Absolute: 0 10*3/uL (ref 0.0–0.1)
Basophils Relative: 0 %
Eosinophils Absolute: 0.5 10*3/uL (ref 0.0–0.5)
Eosinophils Relative: 3 %
HCT: 26.5 % — ABNORMAL LOW (ref 36.0–46.0)
Hemoglobin: 8.3 g/dL — ABNORMAL LOW (ref 12.0–15.0)
Immature Granulocytes: 1 %
Lymphocytes Relative: 3 %
Lymphs Abs: 0.5 10*3/uL — ABNORMAL LOW (ref 0.7–4.0)
MCH: 30.9 pg (ref 26.0–34.0)
MCHC: 31.3 g/dL (ref 30.0–36.0)
MCV: 98.5 fL (ref 80.0–100.0)
Monocytes Absolute: 1.7 10*3/uL — ABNORMAL HIGH (ref 0.1–1.0)
Monocytes Relative: 11 %
Neutro Abs: 12.9 10*3/uL — ABNORMAL HIGH (ref 1.7–7.7)
Neutrophils Relative %: 82 %
Platelets: 237 10*3/uL (ref 150–400)
RBC: 2.69 MIL/uL — ABNORMAL LOW (ref 3.87–5.11)
RDW: 18.4 % — ABNORMAL HIGH (ref 11.5–15.5)
WBC: 15.7 10*3/uL — ABNORMAL HIGH (ref 4.0–10.5)
nRBC: 0 % (ref 0.0–0.2)

## 2020-10-23 MED ORDER — HEPARIN SODIUM (PORCINE) 1000 UNIT/ML IJ SOLN
INTRAMUSCULAR | Status: AC
  Filled 2020-10-23: qty 4

## 2020-10-23 NOTE — Progress Notes (Signed)
PT Cancellation Note  Patient Details Name: Cassandra Dyer MRN: [id number] DOB: August 17, 1941   Cancelled Treatment:    Reason Eval/Treat Not Completed: Patient at procedure or test/unavailable (Pt in HD.  Will return at later date. )   Cassandra Dyer 10/23/2020, 10:05 AM Cassandra Dyer,PT Acute Rehabilitation Services Pager:  [phone]  Office:  [phone]

## 2020-10-23 NOTE — Telephone Encounter (Signed)
    [facility] calling to report patient is being discharged today from hospital. They plan to see patient for home PT eval. Will call back to request verbal orders for frequency once patient is seen.  Service area phone number [phone]

## 2020-10-23 NOTE — Evaluation (Signed)
Occupational Therapy Evaluation Patient Details Name: Cassandra Dyer MRN: [id number] DOB: 11/01/1941 Today's Date: 10/23/2020    History of Present Illness Pt is a 79 y/o female admitted secondary to worsening SOB. Found to have pulmonary edema. PMH includes HTN, R kidney carcinoma s/p nephrectomy, COPD on 2L, and ESRD on HD.    Clinical Impression   PTA patient independent with ADLs, mobility, limited iADLs. Admitted for above and limited by problem list below, including decreased activity tolerance, generalized weakness, and decreased cardiopulmonary status.  Patient on 4L supplemental O2 via [place] (on 2L at baseline), with SpO2 ranging from 86-93% > 90% with cued for pursed lip breathing during activity or rest, RN notified.  Patient completing bed mobility with supervision, transfers with min guard and ADls with min guard to supervision. Believe patient will benefit from continued OT services while admitted and after dc at [facility] level to optimize independence and activity tolerance during ADL tasks. Will follow.        Follow Up Recommendations  Home health OT;Supervision/Assistance - 24 hour    Equipment Recommendations  3 in 1 bedside commode    Recommendations for Other Services       Precautions / Restrictions Precautions Precautions: Fall;Other (comment) Precaution Comments: watch O2 sats Restrictions Weight Bearing Restrictions: No      Mobility Bed Mobility Overal bed mobility: Needs Assistance Bed Mobility: Supine to Sit     Supine to sit: Supervision     General bed mobility comments: supervision for safety     Transfers Overall transfer level: Needs assistance   Transfers: Sit to/from Stand;Stand Pivot Transfers Sit to Stand: Min guard Stand pivot transfers: Min guard       General transfer comment: for safety     Balance Overall balance assessment: Needs assistance Sitting-balance support: No upper extremity supported;Feet supported Sitting  balance-Leahy Scale: Fair     Standing balance support: No upper extremity supported;During functional activity Standing balance-Leahy Scale: Fair                             ADL either performed or assessed with clinical judgement   ADL Overall ADL's : Needs assistance/impaired     Grooming: Set up;Sitting   Upper Body Bathing: Set up;Sitting   Lower Body Bathing: Min guard;Sit to/from stand   Upper Body Dressing : Set up;Sitting   Lower Body Dressing: Min guard;Sit to/from stand   Toilet Transfer: Psychologist, sport and exercise Details (indicate cue type and reason): simulated to recliner          Functional mobility during ADLs: Min guard General ADL Comments: pt limited by decreased activity tolerance      Vision         Perception     Praxis      Pertinent Vitals/Pain Pain Assessment: No/denies pain     Hand Dominance Right   Extremity/Trunk Assessment Upper Extremity Assessment Upper Extremity Assessment: Generalized weakness   Lower Extremity Assessment Lower Extremity Assessment: Defer to PT evaluation   Cervical / Trunk Assessment Cervical / Trunk Assessment: Normal   Communication Communication Communication: No difficulties   Cognition Arousal/Alertness: Awake/alert Behavior During Therapy: WFL for tasks assessed/performed Overall Cognitive Status: Within Functional Limits for tasks assessed                                     General  Comments  pt on 4L [place] upon entry, SpO2 86% with PLB increased to 92%.  With minimal activity desaturates to 86-88%, but with PLB able to maintain 90%. RN notified and will check on pt, requesting incentive spirometer from respiratory     Exercises     Shoulder Instructions      Home Living Family/patient expects to be discharged to:: Private residence Living Arrangements: Non-relatives/Friends Available Help at Discharge: Friend(s);Available 24 hours/day Type of  Home: House Home Access: Level entry     Home Layout: One level     Bathroom Shower/Tub: Teacher, early years/pre: Standard     Home Equipment: Shower seat   Additional Comments: on 2L O2 at baseline       Prior Functioning/Environment Level of Independence: Independent        Comments: independent ADLs        OT Problem List: Decreased strength;Decreased activity tolerance;Impaired balance (sitting and/or standing);Decreased knowledge of use of DME or AE;Decreased knowledge of precautions;Cardiopulmonary status limiting activity      OT Treatment/Interventions: Self-care/ADL training;Therapeutic exercise;DME and/or AE instruction;Therapeutic activities;Patient/family education;Balance training    OT Goals(Current goals can be found in the care plan section) Acute Rehab OT Goals Patient Stated Goal: to feel better OT Goal Formulation: With patient Time For Goal Achievement: 11/06/20 Potential to Achieve Goals: Good  OT Frequency: Min 2X/week   Barriers to D/C:            Co-evaluation              AM-PAC OT "6 Clicks" Daily Activity     Outcome Measure Help from another person eating meals?: None Help from another person taking care of personal grooming?: A Little Help from another person toileting, which includes using toliet, bedpan, or urinal?: A Little Help from another person bathing (including washing, rinsing, drying)?: A Little Help from another person to put on and taking off regular upper body clothing?: A Little Help from another person to put on and taking off regular lower body clothing?: A Little 6 Click Score: 19   End of Session Equipment Utilized During Treatment: Oxygen Nurse Communication: Mobility status;Other (comment) (O2)  Activity Tolerance: Patient tolerated treatment well Patient left: in chair;with call bell/phone within reach  OT Visit Diagnosis: Other abnormalities of gait and mobility (R26.89);Other  (comment);Muscle weakness (generalized) (M62.81) (decreased activity tolerance )                Time: 7846-9629 OT Time Calculation (min): 15 min Charges:  OT General Charges $OT Visit: 1 Visit OT Evaluation $OT Eval Moderate Complexity: 1 Mod  Jolaine Artist, OT Acute Rehabilitation Services Pager [phone] Office [phone]   Delight Stare 10/23/2020, 1:23 PM

## 2020-10-23 NOTE — Progress Notes (Signed)
OT Cancellation Note  Patient Details Name: Cassandra Dyer MRN: [id number] DOB: 1941-03-23   Cancelled Treatment:    Reason Eval/Treat Not Completed: Patient at procedure or test/ unavailable- pt in HD at this time.  Will follow and see as able.   Jolaine Artist, OT Acute Rehabilitation Services Pager [phone] Office [phone]   Delight Stare 10/23/2020, 9:51 AM

## 2020-10-23 NOTE — Progress Notes (Signed)
[facility] Progress Note   Subjective:    Completed dialysis this am. Breathing improved at rest, but hasn't been up to walk. Not ready to go home.   Objective Vitals:   10/23/20 0830 10/23/20 0900 10/23/20 0930 10/23/20 1000  BP: (!) 155/58 (!) 160/57    Pulse: 77 76 79 77  Resp: 18 18 18 18   Temp:      TempSrc:      SpO2:      Weight:      Height:       Physical Exam General: frail,nontoxic appearing, nad on nasal oxygen  Heart: RRR Lungs: Normal WOB, diminished,  no rales, wheeze  Abdomen: soft Extremities: 1+ LE edema Dialysis Access:  RIJ TDC c/d/i, LUE AVF +t/b but small and some collaterals present  Additional Objective Labs: Basic Metabolic Panel: Recent Labs  Lab 10/21/20 1827 10/22/20 0130 10/23/20 0808  NA 133* 136 136  K 4.3 4.4 3.2*  CL 95* 98 99  CO2 24 26 28   GLUCOSE 165* 98 93  BUN 28* 31* 14  CREATININE 6.42* 6.76* 4.08*  CALCIUM 9.2 9.3 8.6*  PHOS  --   --  2.2*   Liver Function Tests: Recent Labs  Lab 10/22/20 0130 10/23/20 0808  AST 32  --   ALT 21  --   ALKPHOS 69  --   BILITOT 1.1  --   PROT 6.0*  --   ALBUMIN 2.3* 1.8*   No results for input(s): LIPASE, AMYLASE in the last 168 hours. CBC: Recent Labs  Lab 10/21/20 1827 10/22/20 0130 10/23/20 0417  WBC 15.4* 16.1* 15.7*  NEUTROABS  --   --  12.9*  HGB 9.2* 9.5* 8.3*  HCT 31.5* 30.1* 26.5*  MCV 99.7 98.4 98.5  PLT 303 298 237   Blood Culture    Component Value Date/Time   SDES BLOOD SITE NOT SPECIFIED 09/10/2020 1414   SPECREQUEST  09/10/2020 1414    BOTTLES DRAWN AEROBIC AND ANAEROBIC Blood Culture adequate volume   CULT  09/10/2020 1414    NO GROWTH 5 DAYS Performed at [facility], [place] [address] [place], [place] [address]    REPTSTATUS 09/15/2020 FINAL 09/10/2020 1414    Cardiac Enzymes: No results for input(s): CKTOTAL, CKMB, CKMBINDEX, TROPONINI in the last 168 hours. CBG: Recent Labs  Lab 10/21/20 2010  GLUCAP 109*   Iron  Studies: No results for input(s): IRON, TIBC, TRANSFERRIN, FERRITIN in the last 72 hours. @lablastinr3 @ Studies/Results: DG Chest 2 View  Result Date: 10/21/2020 CLINICAL DATA:  79 year old female with shortness of breath and increased weakness. Missed hemodialysis today. EXAM: CHEST - 2 VIEW COMPARISON:  Portable chest 09/10/2020 and earlier. FINDINGS: AP and lateral views of the chest today. Stable right chest tunneled dialysis catheter. Moderate bilateral pleural effusions are increased since September, resembling similar effusions in August. Upper lung vascular congestion. Most mediastinal contours are obscured today. Visualized tracheal air column is within normal limits. No pneumothorax. No acute osseous abnormality identified. Abdominal Calcified aortic atherosclerosis. Paucity of bowel gas in the upper abdomen. IMPRESSION: Moderate bilateral pleural effusions are increased since September, with superimposed mild or developing pulmonary interstitial edema. Electronically Signed   By: Genevie Ann M.D.   On: 10/21/2020 18:31   DG CHEST PORT 1 VIEW  Result Date: 10/23/2020 CLINICAL DATA:  Shortness of breath. EXAM: PORTABLE CHEST 1 VIEW COMPARISON:  10/21/2020 FINDINGS: The heart is enlarged but stable. The right IJ dialysis catheter is stable. Interval development  of diffuse interstitial pulmonary edema. Persistent bilateral pleural effusions. No pneumothorax. IMPRESSION: Interval development of diffuse interstitial pulmonary edema. Persistent pleural effusions. Electronically Signed   By: Marijo Sanes M.D.   On: 10/23/2020 07:15   Medications:  . amLODipine  10 mg Oral Daily  . carvedilol  12.5 mg Oral BID WC  . Chlorhexidine Gluconate Cloth  6 each Topical Q0600  . heparin  5,000 Units Subcutaneous Q8H  . heparin sodium (porcine)      . irbesartan  300 mg Oral Daily  . rosuvastatin  10 mg Oral Daily    Dialysis Orders: Dialyzes at [place] MWF 4 hours  EDW 62- getting there. HD Bath 2/2.5,  Dialyzer 180, Heparin no. Access [facility]- has left AVF that can be used after 11/25. mircera100 q 2 weeks, venofer 100 q tx right now 11/3 hgb 8.3 11/1 k 4  Assessment/Plan: 79 year old female with copd and ESRD presenting with weakness and an increased O2 requirement 1 Hypoxia- has a baseline O2 req-  Now increased O2 req  also with pleural effusions.  Had been getting to EDW as OP but with small goals --> reports poor appetite and  suspect she's lost some lean body weight and replaced with edema. Challenging EDW here. Is now below outpatient dry weight. Plan lower EDW at discharge.   2 ESRD: normally MWF at [facility] via [facility]-  Has only been on HD since August.  Missed Mon. HD 11/9 and back on schedule today. Now below EDW. Next HD 11/12.  3 Hypertension: hypertensive and by the appearance of CXR is volume overloaded.  Continue volume lowering with HD  4. Anemia of ESRD: has been on tx with mircera and venofer at OP center-  Hgb 9.5>8.3. Redose ESA as appropriate  5. Metabolic Bone Disease: Ca/Phos ok. is not on vitamin D or sensipar at OP unit.  No binder on OP med list     Lynnda Child PA-C [facility] 10/23/2020,10:49 AM

## 2020-10-23 NOTE — TOC Initial Note (Signed)
Transition of Care [facility]) - Initial/Assessment Note    Patient Details  Name: Cassandra Dyer MRN: [id number] Date of Birth: 10/20/1941  [facility]) CM/SW Contact:    Marilu Favre, RN Phone Number: 10/23/2020, 11:55 AM  Clinical Narrative:                  Patient from home with friend.   Patient agreeable to home health PT, choice offered, no preference. Referral given to and accepted by [facility] with [facility]. Will need home health PT order and face to face signed by MD.   Patient has home oxygen at 2l [place] with [employer] . Patient has portable tank for discharge.   Discussed PT recommendation for a rollator , at this time patient does not feel that she needs one.  Expected Discharge Plan: [place] Barriers to Discharge: Continued Medical Work up   Patient Goals and CMS Choice Patient states their goals for this hospitalization and ongoing recovery are:: to return to home CMS Medicare.gov Compare Post Acute Care list provided to:: Patient Choice offered to / list presented to : Patient  Expected Discharge Plan and Services Expected Discharge Plan: [place]   Discharge Planning Services: CM Consult Post Acute Care Choice: [place] arrangements for the past 2 months: Single Family Home                 DME Arranged: N/A (patient does not want rollator at present) DME Agency: NA       HH Arranged: PT [place] Agency: Other - See comment ([place]) Date [place]: 10/23/20 Time [place]: [place] Representative spoke with at [place]: [place] Arrangements/Services Living arrangements for the past 2 months: [place] with:: Other (Comment) (Friend) Patient language and need for interpreter reviewed:: Yes Do you feel safe going back to the place where you live?: Yes      Need for Family Participation in Patient Care: Yes (Comment) Care giver support  system in place?: Yes (comment) Current home services: DME (Home oxygen with [employer]) Criminal Activity/Legal Involvement Pertinent to Current Situation/Hospitalization: No - Comment as needed  Activities of Daily Living Home Assistive Devices/Equipment: None ADL Screening (condition at time of admission) Patient's cognitive ability adequate to safely complete daily activities?: Yes Is the patient deaf or have difficulty hearing?: No Does the patient have difficulty seeing, even when wearing glasses/contacts?: No Does the patient have difficulty concentrating, remembering, or making decisions?: No Patient able to express need for assistance with ADLs?: Yes Does the patient have difficulty dressing or bathing?: No Independently performs ADLs?: Yes (appropriate for developmental age) Does the patient have difficulty walking or climbing stairs?: No Weakness of Legs: None Weakness of Arms/Hands: None  Permission Sought/Granted   Permission granted to share information with : No              Emotional Assessment Appearance:: Appears stated age Attitude/Demeanor/Rapport: Engaged Affect (typically observed): Accepting Orientation: : Oriented to Self, Oriented to Place, Oriented to  Time, Oriented to Situation Alcohol / Substance Use: Not Applicable Psych Involvement: No (comment)  Admission diagnosis:  Acute pulmonary edema (HCC) [J81.0] Pulmonary edema [J81.1] Acute and chronic respiratory failure with hypoxia (HCC) [J96.21] Hypervolemia, unspecified hypervolemia type [E87.70] Patient Active Problem List   Diagnosis Date Noted  . Pulmonary edema 10/22/2020  . Acute pulmonary edema ([place]) 10/22/2020  . Educated about COVID-19 virus  infection 10/10/2020  . Chronic hypoxemic respiratory failure ([place]) 09/26/2020  . COPD (chronic obstructive pulmonary disease) ([place]) 09/26/2020  . Nausea vomiting and diarrhea 09/10/2020  . ESRD (end stage renal disease) ([place])   . Volume overload    . Cough 08/04/2020  . Thrombocytopenia ([place]) 08/02/2020  . Anemia 08/02/2020  . Hyperkalemia 07/27/2020  . Hyperlipidemia 07/27/2020  . Metabolic acidosis 59/74/1638  . CKD (chronic kidney disease) 05/23/2020  . Solitary kidney, acquired 05/23/2020  . Fever 04/26/2020  . Acute hypoxemic respiratory failure ([place]) 04/19/2020  . Diarrhea 04/19/2020  . Nausea & vomiting 04/19/2020  . Pneumonia 04/09/2020  . Acute kidney injury (AKI) with acute tubular necrosis (ATN) ([place]) 04/09/2020  . Hypertensive urgency 04/09/2020  . History of hepatitis C 04/09/2020  . Sepsis ([place]) 04/08/2020  . Venous insufficiency 12/28/2019  . Hyperglycemia 06/29/2019  . Osteoporosis 02/20/2018  . Essential hypertension 02/15/2017  . Dyslipidemia 02/15/2017  . Renal cell carcinoma of right kidney ([place]) 02/15/2017   PCP:  Biagio Borg, MD Pharmacy:   [facility] ([place]), [place] - [address] [place] [address] Phone: [phone] Fax: [phone]     Social Determinants of Health (SDOH) Interventions    Readmission Risk Interventions Readmission Risk Prevention Plan 08/08/2020  Transportation Screening Complete  Medication Review ([place]) Complete  PCP or Specialist appointment within 3-5 days of discharge Complete  HRI or [place] Complete  SW Recovery Care/Counseling Consult Complete  [place] Not Applicable  Some recent data might be hidden

## 2020-10-23 NOTE — Progress Notes (Signed)
PROGRESS NOTE    Cassandra Dyer  VCB:[id number] DOB: 10-15-1941 DOA: 10/21/2020 PCP: Biagio Borg, MD    Brief Narrative:  Cassandra Dyer is a 79 year old female with past medical history significant for ESRD on HD, history of renal cell carcinoma right kidney status post nephrectomy, essential hypertension, anemia of chronic kidney disease who presented to the ED with complaint of shortness of breath, weakness.  Missed hemodialysis on Monday.  Baseline oxygen requirement of 2 L but now up to 4 L to maintain adequate oxygenation.  In the ED, chest x-ray notable for bilateral pleural effusions and interstitial edema.  EDP consulted hospitalist group for admission secondary to acute on chronic hypoxic respiratory failure secondary to volume overload/pulmonary edema/pleural effusion.   Assessment & Plan:   Principal Problem:   Pulmonary edema Active Problems:   Essential hypertension   Renal cell carcinoma of right kidney (HCC)   ESRD (end stage renal disease) (HCC)   COPD (chronic obstructive pulmonary disease) (HCC)   Acute pulmonary edema (HCC)   Acute respiratory failure with hypoxia (HCC)   Acute on chronic hypoxic respiratory failure COPD Patient presenting from home with progressive shortness of breath, missed HD session.  Chest x-ray on admission notable for bilateral pleural effusions, interstitial edema from volume overload.  Baseline oxygen 2 L per nasal cannula, Continues on 4 L per nasal cannula. --Repeat chest x-ray this morning with continued pulmonary edema/pleural effusions --Continue volume management per HD, plan repeat dialysis this morning. --Fluid restriction 1.2 L daily --net negative 2.5L since admission --Continue supplemental oxygen, maintain SPO2 greater than 88%  Essential hypertension BP 141/52 --Carvedilol 12.5 mg p.o. twice daily, irbesartan 300 mg p.o. daily, amlodipine 10 mg p.o. daily  HLD: Continue Crestor 10 mg p.o. daily  Anemia of chronic  kidney disease Hemoglobin 8.3, stable.   DVT prophylaxis: SCDs Code Status: Full code Family Communication: No family present at bedside  Disposition Plan:  Status is: Inpatient  Remains inpatient appropriate because:Unsafe d/c plan and Inpatient level of care appropriate due to severity of illness   Dispo: The patient is from: Home              Anticipated d/c is to: Home              Anticipated d/c date is: 1 day              Patient currently is not medically stable to d/c.   Consultants:   Nephrology  Procedures:   None  Antimicrobials:   None   Subjective: Patient seen and examined in HD unit, currently undergoing dialysis.  Patient continues with shortness of breath, greater than her normal baseline.  Continues on 4 L nasal cannula with a baseline oxygen requirement of 2 L.  No other complaints or concerns at this time.  Denies headache, no dizziness, no chest pain, no palpitations, no shortness of breath, no abdominal pain, no nausea/vomiting/diarrhea.  No acute events overnight per nursing staff.  Objective: Vitals:   10/23/20 0930 10/23/20 1000 10/23/20 1024 10/23/20 1110  BP:   (!) 166/61 (!) 148/59  Pulse: 79 77 80 80  Resp: 18 18 18 18   Temp:   98.4 F (36.9 C) 98.9 F (37.2 C)  TempSrc:   Oral Oral  SpO2:   95% 92%  Weight:   60.5 kg   Height:        Intake/Output Summary (Last 24 hours) at 10/23/2020 1550 Last data filed at 10/22/2020 1700 Gross  per 24 hour  Intake 240 ml  Output --  Net 240 ml   Filed Weights   10/22/20 1703 10/23/20 0749 10/23/20 1024  Weight: 61.4 kg 63.1 kg 60.5 kg    Examination:  General exam: Appears calm and comfortable  Respiratory system: Clear to auscultation. Respiratory effort normal. Cardiovascular system: S1 & S2 heard, RRR. No JVD, murmurs, rubs, gallops or clicks. No pedal edema.  Other noted Gastrointestinal system: Abdomen is nondistended, soft and nontender. No organomegaly or masses felt. Normal  bowel sounds heard. Central nervous system: Alert and oriented. No focal neurological deficits. Extremities: Symmetric 5 x 5 power. Skin: No rashes, lesions or ulcers Psychiatry: Judgement and insight appear normal. Mood & affect appropriate.     Data Reviewed: I have personally reviewed following labs and imaging studies  CBC: Recent Labs  Lab 10/21/20 1827 10/22/20 0130 10/23/20 0417  WBC 15.4* 16.1* 15.7*  NEUTROABS  --   --  12.9*  HGB 9.2* 9.5* 8.3*  HCT 31.5* 30.1* 26.5*  MCV 99.7 98.4 98.5  PLT 303 298 595   Basic Metabolic Panel: Recent Labs  Lab 10/21/20 1827 10/22/20 0130 10/23/20 0808  NA 133* 136 136  K 4.3 4.4 3.2*  CL 95* 98 99  CO2 24 26 28   GLUCOSE 165* 98 93  BUN 28* 31* 14  CREATININE 6.42* 6.76* 4.08*  CALCIUM 9.2 9.3 8.6*  PHOS  --   --  2.2*   GFR: Estimated Creatinine Clearance: 9.9 mL/min (A) (by C-G formula based on SCr of 4.08 mg/dL (H)). Liver Function Tests: Recent Labs  Lab 10/22/20 0130 10/23/20 0808  AST 32  --   ALT 21  --   ALKPHOS 69  --   BILITOT 1.1  --   PROT 6.0*  --   ALBUMIN 2.3* 1.8*   No results for input(s): LIPASE, AMYLASE in the last 168 hours. No results for input(s): AMMONIA in the last 168 hours. Coagulation Profile: No results for input(s): INR, PROTIME in the last 168 hours. Cardiac Enzymes: No results for input(s): CKTOTAL, CKMB, CKMBINDEX, TROPONINI in the last 168 hours. BNP (last 3 results) No results for input(s): PROBNP in the last 8760 hours. HbA1C: No results for input(s): HGBA1C in the last 72 hours. CBG: Recent Labs  Lab 10/21/20 2010  GLUCAP 109*   Lipid Profile: No results for input(s): CHOL, HDL, LDLCALC, TRIG, CHOLHDL, LDLDIRECT in the last 72 hours. Thyroid Function Tests: No results for input(s): TSH, T4TOTAL, FREET4, T3FREE, THYROIDAB in the last 72 hours. Anemia Panel: No results for input(s): VITAMINB12, FOLATE, FERRITIN, TIBC, IRON, RETICCTPCT in the last 72 hours. Sepsis  Labs: No results for input(s): PROCALCITON, LATICACIDVEN in the last 168 hours.  Recent Results (from the past 240 hour(s))  Respiratory Panel by RT PCR (Flu A&B, Covid) - Nasopharyngeal Swab     Status: None   Collection Time: 10/21/20  6:04 PM   Specimen: Nasopharyngeal Swab  Result Value Ref Range Status   SARS Coronavirus 2 by RT PCR NEGATIVE NEGATIVE Final    Comment: (NOTE) SARS-CoV-2 target nucleic acids are NOT DETECTED.  The SARS-CoV-2 RNA is generally detectable in upper respiratoy specimens during the acute phase of infection. The lowest concentration of SARS-CoV-2 viral copies this assay can detect is 131 copies/mL. A negative result does not preclude SARS-Cov-2 infection and should not be used as the sole basis for treatment or other patient management decisions. A negative result may occur with  improper specimen collection/handling, submission  of specimen other than nasopharyngeal swab, presence of viral mutation(s) within the areas targeted by this assay, and inadequate number of viral copies (<131 copies/mL). A negative result must be combined with clinical observations, patient history, and epidemiological information. The expected result is Negative.  Fact Sheet for Patients:  PinkCheek.be  Fact Sheet for Healthcare Providers:  GravelBags.it  This test is no t yet approved or cleared by the [place] FDA and  has been authorized for detection and/or diagnosis of SARS-CoV-2 by FDA under an Emergency Use Authorization (EUA). This EUA will remain  in effect (meaning this test can be used) for the duration of the COVID-19 declaration under Section 564(b)(1) of the Act, 21 U.S.C. section 360bbb-3(b)(1), unless the authorization is terminated or revoked sooner.     Influenza A by PCR NEGATIVE NEGATIVE Final   Influenza B by PCR NEGATIVE NEGATIVE Final    Comment: (NOTE) The Xpert Xpress  SARS-CoV-2/FLU/RSV assay is intended as an aid in  the diagnosis of influenza from Nasopharyngeal swab specimens and  should not be used as a sole basis for treatment. Nasal washings and  aspirates are unacceptable for Xpert Xpress SARS-CoV-2/FLU/RSV  testing.  Fact Sheet for Patients: PinkCheek.be  Fact Sheet for Healthcare Providers: GravelBags.it  This test is not yet approved or cleared by the [place] FDA and  has been authorized for detection and/or diagnosis of SARS-CoV-2 by  FDA under an Emergency Use Authorization (EUA). This EUA will remain  in effect (meaning this test can be used) for the duration of the  Covid-19 declaration under Section 564(b)(1) of the Act, 21  U.S.C. section 360bbb-3(b)(1), unless the authorization is  terminated or revoked. Performed at [facility], [place] [address] [place], [place] [address]          Radiology Studies: DG Chest 2 View  Result Date: 10/21/2020 CLINICAL DATA:  79 year old female with shortness of breath and increased weakness. Missed hemodialysis today. EXAM: CHEST - 2 VIEW COMPARISON:  Portable chest 09/10/2020 and earlier. FINDINGS: AP and lateral views of the chest today. Stable right chest tunneled dialysis catheter. Moderate bilateral pleural effusions are increased since September, resembling similar effusions in August. Upper lung vascular congestion. Most mediastinal contours are obscured today. Visualized tracheal air column is within normal limits. No pneumothorax. No acute osseous abnormality identified. Abdominal Calcified aortic atherosclerosis. Paucity of bowel gas in the upper abdomen. IMPRESSION: Moderate bilateral pleural effusions are increased since September, with superimposed mild or developing pulmonary interstitial edema. Electronically Signed   By: Genevie Ann M.D.   On: 10/21/2020 18:31   DG CHEST PORT 1 VIEW  Result Date: 10/23/2020 CLINICAL  DATA:  Shortness of breath. EXAM: PORTABLE CHEST 1 VIEW COMPARISON:  10/21/2020 FINDINGS: The heart is enlarged but stable. The right IJ dialysis catheter is stable. Interval development of diffuse interstitial pulmonary edema. Persistent bilateral pleural effusions. No pneumothorax. IMPRESSION: Interval development of diffuse interstitial pulmonary edema. Persistent pleural effusions. Electronically Signed   By: Marijo Sanes M.D.   On: 10/23/2020 07:15        Scheduled Meds: . amLODipine  10 mg Oral Daily  . carvedilol  12.5 mg Oral BID WC  . Chlorhexidine Gluconate Cloth  6 each Topical Q0600  . heparin  5,000 Units Subcutaneous Q8H  . heparin sodium (porcine)      . irbesartan  300 mg Oral Daily  . rosuvastatin  10 mg Oral Daily   Continuous Infusions:   LOS: 0 days  Time spent: 36 minutes spent on chart review, discussion with nursing staff, consultants, updating family and interview/physical exam; more than 50% of that time was spent in counseling and/or coordination of care.    Naftuli Dalsanto J [place] DO [facility] Available via Epic secure chat 7am-7pm After these hours, please refer to coverage provider listed on [email or web address] 10/23/2020, 3:50 PM

## 2020-10-23 NOTE — Care Management Obs Status (Signed)
[facility] NOTIFICATION   Patient Details  Name: Cassandra Dyer MRN: [id number] Date of Birth: Jul 31, 1941   Medicare Observation Status Notification Given:  Yes    Marilu Favre, RN 10/23/2020, 11:52 AM

## 2020-10-24 ENCOUNTER — Inpatient Hospital Stay (HOSPITAL_COMMUNITY): Payer: Medicare Other

## 2020-10-24 DIAGNOSIS — J90 Pleural effusion, not elsewhere classified: Secondary | ICD-10-CM

## 2020-10-24 DIAGNOSIS — N186 End stage renal disease: Secondary | ICD-10-CM | POA: Diagnosis not present

## 2020-10-24 DIAGNOSIS — J810 Acute pulmonary edema: Secondary | ICD-10-CM | POA: Diagnosis not present

## 2020-10-24 DIAGNOSIS — J9601 Acute respiratory failure with hypoxia: Secondary | ICD-10-CM | POA: Diagnosis not present

## 2020-10-24 DIAGNOSIS — J449 Chronic obstructive pulmonary disease, unspecified: Secondary | ICD-10-CM | POA: Diagnosis not present

## 2020-10-24 HISTORY — PX: IR THORACENTESIS ASP PLEURAL SPACE W/IMG GUIDE: IMG5380

## 2020-10-24 LAB — GRAM STAIN

## 2020-10-24 LAB — BODY FLUID CELL COUNT WITH DIFFERENTIAL
Eos, Fluid: 0 %
Lymphs, Fluid: 41 %
Monocyte-Macrophage-Serous Fluid: 31 % — ABNORMAL LOW (ref 50–90)
Neutrophil Count, Fluid: 28 % — ABNORMAL HIGH (ref 0–25)
Total Nucleated Cell Count, Fluid: 365 cu mm (ref 0–1000)

## 2020-10-24 LAB — PROTEIN, PLEURAL OR PERITONEAL FLUID: Total protein, fluid: <3 g/dL

## 2020-10-24 LAB — LACTATE DEHYDROGENASE, PLEURAL OR PERITONEAL FLUID: LD, Fluid: 71 U/L — ABNORMAL HIGH (ref 3–23)

## 2020-10-24 LAB — LACTATE DEHYDROGENASE: LDH: 229 U/L — ABNORMAL HIGH (ref 98–192)

## 2020-10-24 MED ORDER — DARBEPOETIN ALFA 100 MCG/0.5ML IJ SOSY
100.0000 ug | PREFILLED_SYRINGE | INTRAMUSCULAR | Status: DC
  Administered 2020-10-25 – 2020-11-01 (×2): 100 ug via INTRAVENOUS
  Filled 2020-10-24 (×2): qty 0.5

## 2020-10-24 MED ORDER — LIDOCAINE HCL 1 % IJ SOLN
INTRAMUSCULAR | Status: AC
  Filled 2020-10-24: qty 20

## 2020-10-24 MED ORDER — LIDOCAINE HCL (PF) 1 % IJ SOLN
INTRAMUSCULAR | Status: DC | PRN
  Administered 2020-10-24: 10 mL

## 2020-10-24 NOTE — Progress Notes (Signed)
Occupational Therapy Treatment Patient Details Name: Cassandra Dyer MRN: [id number] DOB: 08-Mar-1941 Today's Date: 10/24/2020    History of present illness Pt is a 79 y/o female admitted secondary to worsening SOB. Found to have pulmonary edema. PMH includes HTN, R kidney carcinoma s/p nephrectomy, COPD on 2L, and ESRD on HD.    OT comments  Patient seated in recliner upon entry.  Engaged in functional mobility to/from restroom with min guard given cueing for RW mgmt and assist for O2 line mgmt; toilet transfers with min guard and toileting with min assist.  Patient fatigues with sustained ADL activity, but SpO2 maintained on 4L via [place].  Reports discomfort at IR site and difficulty taking a deep breath, RN notified and pt requesting medication.  Continue to follow acutely and recommend HHOT at dc.    Follow Up Recommendations  Home health OT;Supervision/Assistance - 24 hour    Equipment Recommendations  3 in 1 bedside commode    Recommendations for Other Services      Precautions / Restrictions Precautions Precautions: Fall;Other (comment) Precaution Comments: watch O2 sats Restrictions Weight Bearing Restrictions: No       Mobility Bed Mobility Overal bed mobility: Needs Assistance Bed Mobility: Supine to Sit     Supine to sit: Supervision     General bed mobility comments: OOB in recliner upon entry   Transfers Overall transfer level: Needs assistance Equipment used: Rolling walker (2 wheeled) Transfers: Sit to/from Stand Sit to Stand: Min guard         General transfer comment: cueing for hand placement and safety with RW     Balance Overall balance assessment: Needs assistance Sitting-balance support: No upper extremity supported;Feet supported Sitting balance-Leahy Scale: Fair     Standing balance support: No upper extremity supported;During functional activity Standing balance-Leahy Scale: Fair Standing balance comment: preference to UE support                            ADL either performed or assessed with clinical judgement   ADL Overall ADL's : Needs assistance/impaired     Grooming: Set up;Sitting                   Toilet Transfer: Min guard;Ambulation;RW   Toileting- Clothing Manipulation and Hygiene: Sit to/from stand;Minimal assistance Toileting - Clothing Manipulation Details (indicate cue type and reason): clothing mgmt due to increased time with hygiene after +BM      Functional mobility during ADLs: Min guard;Rolling walker General ADL Comments: fatigues easily with prolonged ADL engagement      Vision       Perception     Praxis      Cognition Arousal/Alertness: Awake/alert Behavior During Therapy: WFL for tasks assessed/performed Overall Cognitive Status: Within Functional Limits for tasks assessed                                          Exercises     Shoulder Instructions       General Comments 4L [place], O2 saturations maintained >92% but fatigues after toileting    Pertinent Vitals/ Pain       Pain Assessment: Faces Faces Pain Scale: Hurts a little bit Pain Location: IR site after exertion Pain Descriptors / Indicators: Discomfort Pain Intervention(s): Limited activity within patient's tolerance;Monitored during session;Repositioned;Patient requesting pain meds-RN notified  Home Living  Prior Functioning/Environment              Frequency  Min 2X/week        Progress Toward Goals  OT Goals(current goals can now be found in the care plan section)  Progress towards OT goals: Progressing toward goals  Acute Rehab OT Goals Patient Stated Goal: to feel better OT Goal Formulation: With patient  Plan Discharge plan remains appropriate;Frequency remains appropriate    Co-evaluation                 AM-PAC OT "6 Clicks" Daily Activity     Outcome Measure   Help from another person  eating meals?: None Help from another person taking care of personal grooming?: A Little Help from another person toileting, which includes using toliet, bedpan, or urinal?: A Little Help from another person bathing (including washing, rinsing, drying)?: A Little Help from another person to put on and taking off regular upper body clothing?: A Little Help from another person to put on and taking off regular lower body clothing?: A Little 6 Click Score: 19    End of Session Equipment Utilized During Treatment: Oxygen  OT Visit Diagnosis: Other abnormalities of gait and mobility (R26.89);Other (comment);Muscle weakness (generalized) (M62.81) (decreased activity tolerance)   Activity Tolerance Patient tolerated treatment well   Patient Left in chair;with call bell/phone within reach   Nurse Communication Mobility status;Patient requests pain meds        Time: 1326-1401 OT Time Calculation (min): 35 min  Charges: OT General Charges $OT Visit: 1 Visit OT Treatments $Self Care/Home Management : 23-37 mins  [place] Pager [phone] Office [phone]    Delight Stare 10/24/2020, 3:40 PM

## 2020-10-24 NOTE — Progress Notes (Signed)
[facility] Progress Note   Subjective:    Had dialysis yesterday with another 3L removed.  Seen in room. Slept well. No issues overnight. Remains on 4L [place] this am   Objective Vitals:   10/23/20 2222 10/24/20 0500 10/24/20 0638 10/24/20 0859  BP: (!) 133/53  101/82 (!) 132/56  Pulse: 81  80 83  Resp: 19  15 16   Temp: 98.3 F (36.8 C)  99.2 F (37.3 C) 98.5 F (36.9 C)  TempSrc: Oral  Oral Oral  SpO2: 94%  96% 91%  Weight:  61.4 kg    Height:       Physical Exam General: frail,nontoxic appearing, nad on nasal oxygen  Heart: RRR Lungs: Normal WOB, diminished,  no rales, no wheeze  Abdomen: soft Extremities: 1+ LE edema bilaterally  Dialysis Access:  RIJ TDC c/d/i, LUE AVF +t/b but small and some collaterals present  Additional Objective Labs: Basic Metabolic Panel: Recent Labs  Lab 10/21/20 1827 10/22/20 0130 10/23/20 0808  NA 133* 136 136  K 4.3 4.4 3.2*  CL 95* 98 99  CO2 24 26 28   GLUCOSE 165* 98 93  BUN 28* 31* 14  CREATININE 6.42* 6.76* 4.08*  CALCIUM 9.2 9.3 8.6*  PHOS  --   --  2.2*   Liver Function Tests: Recent Labs  Lab 10/22/20 0130 10/23/20 0808  AST 32  --   ALT 21  --   ALKPHOS 69  --   BILITOT 1.1  --   PROT 6.0*  --   ALBUMIN 2.3* 1.8*   No results for input(s): LIPASE, AMYLASE in the last 168 hours. CBC: Recent Labs  Lab 10/21/20 1827 10/22/20 0130 10/23/20 0417  WBC 15.4* 16.1* 15.7*  NEUTROABS  --   --  12.9*  HGB 9.2* 9.5* 8.3*  HCT 31.5* 30.1* 26.5*  MCV 99.7 98.4 98.5  PLT 303 298 237   Blood Culture    Component Value Date/Time   SDES BLOOD SITE NOT SPECIFIED 09/10/2020 1414   SPECREQUEST  09/10/2020 1414    BOTTLES DRAWN AEROBIC AND ANAEROBIC Blood Culture adequate volume   CULT  09/10/2020 1414    NO GROWTH 5 DAYS Performed at [facility], [place] [address], [place], [place] [address]    REPTSTATUS 09/15/2020 FINAL 09/10/2020 1414    Cardiac Enzymes: No results for input(s): CKTOTAL,  CKMB, CKMBINDEX, TROPONINI in the last 168 hours. CBG: Recent Labs  Lab 10/21/20 2010  GLUCAP 109*   Iron Studies: No results for input(s): IRON, TIBC, TRANSFERRIN, FERRITIN in the last 72 hours. @lablastinr3 @ Studies/Results: DG CHEST PORT 1 VIEW  Result Date: 10/23/2020 CLINICAL DATA:  Shortness of breath. EXAM: PORTABLE CHEST 1 VIEW COMPARISON:  10/21/2020 FINDINGS: The heart is enlarged but stable. The right IJ dialysis catheter is stable. Interval development of diffuse interstitial pulmonary edema. Persistent bilateral pleural effusions. No pneumothorax. IMPRESSION: Interval development of diffuse interstitial pulmonary edema. Persistent pleural effusions. Electronically Signed   By: Marijo Sanes M.D.   On: 10/23/2020 07:15   Medications:  . amLODipine  10 mg Oral Daily  . carvedilol  12.5 mg Oral BID WC  . Chlorhexidine Gluconate Cloth  6 each Topical Q0600  . heparin  5,000 Units Subcutaneous Q8H  . irbesartan  300 mg Oral Daily  . rosuvastatin  10 mg Oral Daily    Dialysis Orders: Dialyzes at [facility] MWF 4 hours  EDW 62- getting there. HD Bath 2/2.5, Dialyzer 180, Heparin no. Access [facility]- has left  AVF that can be used after 11/25. mircera100 q 2 weeks, (last 10/27)  venofer 100 q tx right now 11/3 hgb 8.3 11/1 k 4  Assessment/Plan: 79 year old female with copd and ESRD presenting with weakness and an increased O2 requirement 1 Hypoxia- has a baseline O2 req-  Now increased O2 req  also with pleural effusions.  Had been getting to EDW as OP but with small goals --> reports poor appetite and  suspect she's lost some lean body weight and replaced with edema. Challenging EDW here. Weights coming down. Is now below outpatient dry weight. Plan lower EDW at discharge.   2 ESRD: normally MWF at [facility]-  Has only been on HD since August.  Missed Mon. HD 11/9 and 11/10. Next HD 11/12 -4K bath. 3-3.5L UF  3 Hypertension: hypertensive on admission and by CXR is volume overloaded.  BP  improving. Continue volume lowering with HD  4. Anemia of ESRD: has been on tx with mircera and venofer at OP center-  Hgb 9.5>8.3. Due for ESA redose - will order with HD 11/12 5. Metabolic Bone Disease: Ca/Phos ok. is not on vitamin D or sensipar at OP unit.  No binder on OP med list     Lynnda Child PA-C [facility] 10/24/2020,9:40 AM

## 2020-10-24 NOTE — Progress Notes (Signed)
PROGRESS NOTE    Cassandra Dyer  DUK:[id number] DOB: 04/24/1941 DOA: 10/21/2020 PCP: Biagio Borg, MD    Brief Narrative:  Cassandra Dyer is a 79 year old female with past medical history significant for ESRD on HD, history of renal cell carcinoma right kidney status post nephrectomy, essential hypertension, anemia of chronic kidney disease who presented to the ED with complaint of shortness of breath, weakness.  Missed hemodialysis on Monday.  Baseline oxygen requirement of 2 L but now up to 4 L to maintain adequate oxygenation.  In the ED, chest x-ray notable for bilateral pleural effusions and interstitial edema.  EDP consulted hospitalist group for admission secondary to acute on chronic hypoxic respiratory failure secondary to volume overload/pulmonary edema/pleural effusion.   Assessment & Plan:   Principal Problem:   Pulmonary edema Active Problems:   Essential hypertension   Renal cell carcinoma of right kidney (HCC)   ESRD (end stage renal disease) (HCC)   COPD (chronic obstructive pulmonary disease) (HCC)   Acute pulmonary edema (HCC)   Acute respiratory failure with hypoxia (HCC)   Acute on chronic hypoxic respiratory failure 2/2 pulmonary edema and bilateral pleural effusions COPD Patient presenting from home with progressive shortness of breath, missed HD session.  Chest x-ray on admission notable for bilateral pleural effusions, interstitial edema from volume overload.  Baseline oxygen 2 L per nasal cannula, Continues on 4 L per nasal cannula.  Underwent ultrasound-guided left thoracentesis with 1 L fluid removed this morning. --Continue HD per nephrology --Fluid restriction 1.2 L daily --net negative 3.5LL since admission --Continue supplemental oxygen, titrate to maintain SPO2 greater than 88%  Essential hypertension BP 141/52 --Carvedilol 12.5 mg p.o. twice daily, irbesartan 300 mg p.o. daily, amlodipine 10 mg p.o. daily  HLD: Continue Crestor 10 mg p.o.  daily  Anemia of chronic kidney disease Hemoglobin 8.3, stable.   DVT prophylaxis: SCDs Code Status: Full code Family Communication: No family present at bedside  Disposition Plan:  Status is: Inpatient  Remains inpatient appropriate because:Unsafe d/c plan and Inpatient level of care appropriate due to severity of illness   Dispo: The patient is from: Home              Anticipated d/c is to: Home              Anticipated d/c date is: 1 day              Patient currently is not medically stable to d/c.   Consultants:   Nephrology  Interventional radiology  Procedures:  Thoracentesis, left 10/24/2020; 1L removed  Antimicrobials:   None   Subjective: Patient seen and examined at bedside, sleeping but easily arousable.  Continues on 4 L nasal cannula with SPO2 91% this morning; baseline 2 L per nasal cannula.  Underwent repeat dialysis yesterday.  Continues with shortness of breath.  Discussed with patient given her significant bilateral pleural effusions, recommend thoracentesis in which she was in agreement with.  No other complaints or concerns at this time.  Denies headache, no dizziness, no chest pain, no palpitations, no abdominal pain, no nausea/vomiting/diarrhea.  No acute events overnight per nursing staff.  Objective: Vitals:   10/24/20 0859 10/24/20 1045 10/24/20 1057 10/24/20 1222  BP: (!) 132/56 (!) 115/50 (!) 108/46 (!) 98/48  Pulse: 83   75  Resp: 16   16  Temp: 98.5 F (36.9 C)   98.7 F (37.1 C)  TempSrc: Oral   Oral  SpO2: 91%   99%  Weight:      Height:        Intake/Output Summary (Last 24 hours) at 10/24/2020 1304 Last data filed at 10/24/2020 1058 Gross per 24 hour  Intake --  Output 1000 ml  Net -1000 ml   Filed Weights   10/23/20 0749 10/23/20 1024 10/24/20 0500  Weight: 63.1 kg 60.5 kg 61.4 kg    Examination:  General exam: Appears calm and comfortable  Respiratory system: Clear to auscultation. Respiratory effort normal.  On  4 L nasal cannula with SPO2 91% (baseline 2LNC) Cardiovascular system: S1 & S2 heard, RRR. No JVD, murmurs, rubs, gallops or clicks. No pedal edema.  Other noted Gastrointestinal system: Abdomen is nondistended, soft and nontender. No organomegaly or masses felt. Normal bowel sounds heard. Central nervous system: Alert and oriented. No focal neurological deficits. Extremities: Symmetric 5 x 5 power. Skin: No rashes, lesions or ulcers Psychiatry: Judgement and insight appear normal. Mood & affect appropriate.     Data Reviewed: I have personally reviewed following labs and imaging studies  CBC: Recent Labs  Lab 10/21/20 1827 10/22/20 0130 10/23/20 0417  WBC 15.4* 16.1* 15.7*  NEUTROABS  --   --  12.9*  HGB 9.2* 9.5* 8.3*  HCT 31.5* 30.1* 26.5*  MCV 99.7 98.4 98.5  PLT 303 298 673   Basic Metabolic Panel: Recent Labs  Lab 10/21/20 1827 10/22/20 0130 10/23/20 0808  NA 133* 136 136  K 4.3 4.4 3.2*  CL 95* 98 99  CO2 24 26 28   GLUCOSE 165* 98 93  BUN 28* 31* 14  CREATININE 6.42* 6.76* 4.08*  CALCIUM 9.2 9.3 8.6*  PHOS  --   --  2.2*   GFR: Estimated Creatinine Clearance: 10 mL/min (A) (by C-G formula based on SCr of 4.08 mg/dL (H)). Liver Function Tests: Recent Labs  Lab 10/22/20 0130 10/23/20 0808  AST 32  --   ALT 21  --   ALKPHOS 69  --   BILITOT 1.1  --   PROT 6.0*  --   ALBUMIN 2.3* 1.8*   No results for input(s): LIPASE, AMYLASE in the last 168 hours. No results for input(s): AMMONIA in the last 168 hours. Coagulation Profile: No results for input(s): INR, PROTIME in the last 168 hours. Cardiac Enzymes: No results for input(s): CKTOTAL, CKMB, CKMBINDEX, TROPONINI in the last 168 hours. BNP (last 3 results) No results for input(s): PROBNP in the last 8760 hours. HbA1C: No results for input(s): HGBA1C in the last 72 hours. CBG: Recent Labs  Lab 10/21/20 2010  GLUCAP 109*   Lipid Profile: No results for input(s): CHOL, HDL, LDLCALC, TRIG, CHOLHDL,  LDLDIRECT in the last 72 hours. Thyroid Function Tests: No results for input(s): TSH, T4TOTAL, FREET4, T3FREE, THYROIDAB in the last 72 hours. Anemia Panel: No results for input(s): VITAMINB12, FOLATE, FERRITIN, TIBC, IRON, RETICCTPCT in the last 72 hours. Sepsis Labs: No results for input(s): PROCALCITON, LATICACIDVEN in the last 168 hours.  Recent Results (from the past 240 hour(s))  Respiratory Panel by RT PCR (Flu A&B, Covid) - Nasopharyngeal Swab     Status: None   Collection Time: 10/21/20  6:04 PM   Specimen: Nasopharyngeal Swab  Result Value Ref Range Status   SARS Coronavirus 2 by RT PCR NEGATIVE NEGATIVE Final    Comment: (NOTE) SARS-CoV-2 target nucleic acids are NOT DETECTED.  The SARS-CoV-2 RNA is generally detectable in upper respiratoy specimens during the acute phase of infection. The lowest concentration of SARS-CoV-2 viral copies this assay  can detect is 131 copies/mL. A negative result does not preclude SARS-Cov-2 infection and should not be used as the sole basis for treatment or other patient management decisions. A negative result may occur with  improper specimen collection/handling, submission of specimen other than nasopharyngeal swab, presence of viral mutation(s) within the areas targeted by this assay, and inadequate number of viral copies (<131 copies/mL). A negative result must be combined with clinical observations, patient history, and epidemiological information. The expected result is Negative.  Fact Sheet for Patients:  PinkCheek.be  Fact Sheet for Healthcare Providers:  GravelBags.it  This test is no t yet approved or cleared by the [place] FDA and  has been authorized for detection and/or diagnosis of SARS-CoV-2 by FDA under an Emergency Use Authorization (EUA). This EUA will remain  in effect (meaning this test can be used) for the duration of the COVID-19 declaration under  Section 564(b)(1) of the Act, 21 U.S.C. section 360bbb-3(b)(1), unless the authorization is terminated or revoked sooner.     Influenza A by PCR NEGATIVE NEGATIVE Final   Influenza B by PCR NEGATIVE NEGATIVE Final    Comment: (NOTE) The Xpert Xpress SARS-CoV-2/FLU/RSV assay is intended as an aid in  the diagnosis of influenza from Nasopharyngeal swab specimens and  should not be used as a sole basis for treatment. Nasal washings and  aspirates are unacceptable for Xpert Xpress SARS-CoV-2/FLU/RSV  testing.  Fact Sheet for Patients: PinkCheek.be  Fact Sheet for Healthcare Providers: GravelBags.it  This test is not yet approved or cleared by the [place] FDA and  has been authorized for detection and/or diagnosis of SARS-CoV-2 by  FDA under an Emergency Use Authorization (EUA). This EUA will remain  in effect (meaning this test can be used) for the duration of the  Covid-19 declaration under Section 564(b)(1) of the Act, 21  U.S.C. section 360bbb-3(b)(1), unless the authorization is  terminated or revoked. Performed at [facility], [place] [address], [place], [place] [address]   Gram stain     Status: None   Collection Time: 10/24/20 11:20 AM   Specimen: Lung, Left; Pleural Fluid  Result Value Ref Range Status   Specimen Description LUNG  Final   Special Requests NONE  Final   Gram Stain   Final    WBC PRESENT,BOTH PMN AND MONONUCLEAR NO ORGANISMS SEEN CYTOSPIN SMEAR Performed at [facility], [address] [place], [place] [address]    Report Status 10/24/2020 FINAL  Final         Radiology Studies: DG Chest 1 View  Result Date: 10/24/2020 CLINICAL DATA:  Post thoracentesis today EXAM: CHEST  1 VIEW COMPARISON:  10/23/2020 FINDINGS: Significant improvement in bilateral pleural effusions with near complete clearing of left pleural fluid and small residual right pleural effusion. Improved  aeration in both lung bases persistent right lower lobe atelectasis. Negative for pneumothorax. Improvement in vascular congestion and edema. Dialysis catheter in the lower SVC unchanged. IMPRESSION: Improvement in bilateral pleural effusions and bilateral edema. No pneumothorax post thoracentesis. Electronically Signed   By: Franchot Gallo M.D.   On: 10/24/2020 11:21   DG CHEST PORT 1 VIEW  Result Date: 10/23/2020 CLINICAL DATA:  Shortness of breath. EXAM: PORTABLE CHEST 1 VIEW COMPARISON:  10/21/2020 FINDINGS: The heart is enlarged but stable. The right IJ dialysis catheter is stable. Interval development of diffuse interstitial pulmonary edema. Persistent bilateral pleural effusions. No pneumothorax. IMPRESSION: Interval development of diffuse interstitial pulmonary edema. Persistent pleural effusions. Electronically  Signed   By: Marijo Sanes M.D.   On: 10/23/2020 07:15   IR THORACENTESIS ASP PLEURAL SPACE W/IMG GUIDE  Result Date: 10/24/2020 INDICATION: Patient with ESRD in fluid overload, acute pulmonary edema, dyspnea, and bilateral pleural effusions, L>R. Request is made for diagnostic and therapeutic left thoracentesis. EXAM: ULTRASOUND GUIDED DIAGNOSTIC AND THERAPEUTIC LEFT THORACENTESIS MEDICATIONS: 10 mL 1% lidocaine COMPLICATIONS: None immediate. PROCEDURE: An ultrasound guided thoracentesis was thoroughly discussed with the patient and questions answered. The benefits, risks, alternatives and complications were also discussed. The patient understands and wishes to proceed with the procedure. Written consent was obtained. Ultrasound was performed to localize and mark an adequate pocket of fluid in the left chest. The area was then prepped and draped in the normal sterile fashion. 1% Lidocaine was used for local anesthesia. Under ultrasound guidance a 6 Fr Safe-T-Centesis catheter was introduced. Thoracentesis was performed. The catheter was removed and a dressing applied. FINDINGS: A total of  approximately 1 L of clear gold fluid was removed. Samples were sent to the laboratory as requested by the clinical team. IMPRESSION: Successful ultrasound guided left thoracentesis yielding 1 L of pleural fluid. Read by: Earley Abide, PA-C Electronically Signed   By: Ruthann Cancer MD   On: 10/24/2020 11:54        Scheduled Meds: . amLODipine  10 mg Oral Daily  . carvedilol  12.5 mg Oral BID WC  . Chlorhexidine Gluconate Cloth  6 each Topical Q0600  . [START ON 10/25/2020] darbepoetin (ARANESP) injection - DIALYSIS  100 mcg Intravenous Q Fri-HD  . heparin  5,000 Units Subcutaneous Q8H  . irbesartan  300 mg Oral Daily  . lidocaine      . rosuvastatin  10 mg Oral Daily   Continuous Infusions:   LOS: 1 day    Time spent: 35 minutes spent on chart review, discussion with nursing staff, consultants, updating family and interview/physical exam; more than 50% of that time was spent in counseling and/or coordination of care.    Sebastian Dzik J [place] DO [facility] Available via Epic secure chat 7am-7pm After these hours, please refer to coverage provider listed on [email or web address] 10/24/2020, 1:04 PM

## 2020-10-24 NOTE — Progress Notes (Signed)
Physical Therapy Treatment Patient Details Name: Cassandra Dyer MRN: [id number] DOB: 1941/02/26 Today's Date: 10/24/2020    History of Present Illness Pt is a 79 y/o female admitted secondary to worsening SOB. Found to have pulmonary edema. PMH includes HTN, R kidney carcinoma s/p nephrectomy, COPD on 2L, and ESRD on HD.     PT Comments    Pt recently returned from IR and states she is feeling better after having some fluid removed from her lungs. She is agreeable to attempt ambulation with RW this session. Pt ambulated in hallway with no signs of de-sating (pulse ox battery dead and unable to get a reading).  Discussed using a rollator when going farther distances and pt is agreeable to try. Will continue to follow acutely for mobility progression.    Follow Up Recommendations  Home health PT;Supervision for mobility/OOB (pending progression )     Equipment Recommendations  Other (comment) (rollator)    Recommendations for Other Services       Precautions / Restrictions Precautions Precautions: Fall;Other (comment) Precaution Comments: watch O2 sats Restrictions Weight Bearing Restrictions: No    Mobility  Bed Mobility Overal bed mobility: Needs Assistance Bed Mobility: Supine to Sit     Supine to sit: Supervision     General bed mobility comments: supervision for safety. Use of bed rails with HOB elevated  Transfers Overall transfer level: Needs assistance Equipment used: Rolling walker (2 wheeled) Transfers: Sit to/from Stand Sit to Stand: Min guard         General transfer comment: cues for hand placement as pt had not used RW before.   Ambulation/Gait Ambulation/Gait assistance: Min guard Gait Distance (Feet): 200 Feet Assistive device: Rolling walker (2 wheeled) Gait Pattern/deviations: Step-through pattern;Decreased stride length;Drifts right/left Gait velocity: decreased   General Gait Details: VC for RW proximity and technique with turning as this  is pt's first time using RW. No signs or symptoms of low SpO2 during gait.    Stairs             Wheelchair Mobility    Modified Rankin (Stroke Patients Only)       Balance Overall balance assessment: Needs assistance Sitting-balance support: No upper extremity supported;Feet supported Sitting balance-Leahy Scale: Fair     Standing balance support: No upper extremity supported;During functional activity Standing balance-Leahy Scale: Fair Standing balance comment: Walked from bed to recliner chair w/o UE support.                             Cognition Arousal/Alertness: Awake/alert Behavior During Therapy: WFL for tasks assessed/performed Overall Cognitive Status: Within Functional Limits for tasks assessed                                        Exercises      General Comments General comments (skin integrity, edema, etc.): on 4L [place]      Pertinent Vitals/Pain Pain Assessment: No/denies pain    Home Living                      Prior Function            PT Goals (current goals can now be found in the care plan section) Acute Rehab PT Goals Patient Stated Goal: to feel better PT Goal Formulation: With patient Time For Goal Achievement: 11/05/20 Potential  to Achieve Goals: Fair Progress towards PT goals: Progressing toward goals    Frequency    Min 3X/week      PT Plan Current plan remains appropriate    Co-evaluation              AM-PAC PT "6 Clicks" Mobility   Outcome Measure  Help needed turning from your back to your side while in a flat bed without using bedrails?: None Help needed moving from lying on your back to sitting on the side of a flat bed without using bedrails?: None Help needed moving to and from a bed to a chair (including a wheelchair)?: A Little Help needed standing up from a chair using your arms (e.g., wheelchair or bedside chair)?: A Little Help needed to walk in hospital room?: A  Little Help needed climbing 3-5 steps with a railing? : A Little 6 Click Score: 20    End of Session Equipment Utilized During Treatment: Gait belt Activity Tolerance: Patient tolerated treatment well Patient left: with call bell/phone within reach;in chair Nurse Communication: Mobility status PT Visit Diagnosis: Other abnormalities of gait and mobility (R26.89);Difficulty in walking, not elsewhere classified (R26.2)     Time: 1937-9024 PT Time Calculation (min) (ACUTE ONLY): 22 min  Charges:  $Gait Training: 8-22 mins                     Benjiman Core, [place] Pager [phone] Acute Rehab  Allena Katz 10/24/2020, 12:50 PM

## 2020-10-24 NOTE — Procedures (Signed)
PROCEDURE SUMMARY:  Successful image-guided left thoracentesis. Yielded 1.0 liter of clear gold fluid. Patient tolerated procedure well. No immediate complications. EBL = 0 mL.  Specimen was sent for labs. CXR ordered.  Please see imaging section of Epic for full dictation.   Claris Pong Trenee Igoe PA-C 10/24/2020 11:52 AM

## 2020-10-25 ENCOUNTER — Inpatient Hospital Stay (HOSPITAL_COMMUNITY): Payer: Medicare Other

## 2020-10-25 DIAGNOSIS — J810 Acute pulmonary edema: Secondary | ICD-10-CM | POA: Diagnosis not present

## 2020-10-25 DIAGNOSIS — J449 Chronic obstructive pulmonary disease, unspecified: Secondary | ICD-10-CM | POA: Diagnosis not present

## 2020-10-25 DIAGNOSIS — N186 End stage renal disease: Secondary | ICD-10-CM | POA: Diagnosis not present

## 2020-10-25 DIAGNOSIS — J9601 Acute respiratory failure with hypoxia: Secondary | ICD-10-CM | POA: Diagnosis not present

## 2020-10-25 HISTORY — PX: IR THORACENTESIS ASP PLEURAL SPACE W/IMG GUIDE: IMG5380

## 2020-10-25 LAB — CBC
HCT: 25.1 % — ABNORMAL LOW (ref 36.0–46.0)
HCT: 24.8 % — ABNORMAL LOW (ref 36.0–46.0)
Hemoglobin: 8.1 g/dL — ABNORMAL LOW (ref 12.0–15.0)
Hemoglobin: 8 g/dL — ABNORMAL LOW (ref 12.0–15.0)
MCH: 30.8 pg (ref 26.0–34.0)
MCH: 32 pg (ref 26.0–34.0)
MCHC: 32.3 g/dL (ref 30.0–36.0)
MCHC: 32.3 g/dL (ref 30.0–36.0)
MCV: 95.4 fL (ref 80.0–100.0)
MCV: 99.2 fL (ref 80.0–100.0)
Platelets: 260 10*3/uL (ref 150–400)
Platelets: 262 10*3/uL (ref 150–400)
RBC: 2.63 MIL/uL — ABNORMAL LOW (ref 3.87–5.11)
RBC: 2.5 MIL/uL — ABNORMAL LOW (ref 3.87–5.11)
RDW: 17.2 % — ABNORMAL HIGH (ref 11.5–15.5)
RDW: 18 % — ABNORMAL HIGH (ref 11.5–15.5)
WBC: 16.3 10*3/uL — ABNORMAL HIGH (ref 4.0–10.5)
WBC: 16.3 10*3/uL — ABNORMAL HIGH (ref 4.0–10.5)
nRBC: 0 % (ref 0.0–0.2)
nRBC: 0 % (ref 0.0–0.2)

## 2020-10-25 LAB — BASIC METABOLIC PANEL
Anion gap: 8 (ref 5–15)
BUN: 28 mg/dL — ABNORMAL HIGH (ref 8–23)
CO2: 26 mmol/L (ref 22–32)
Calcium: 8.6 mg/dL — ABNORMAL LOW (ref 8.9–10.3)
Chloride: 97 mmol/L — ABNORMAL LOW (ref 98–111)
Creatinine, Ser: 6.73 mg/dL — ABNORMAL HIGH (ref 0.44–1.00)
GFR, Estimated: 6 mL/min — ABNORMAL LOW (ref >60)
Glucose, Bld: 99 mg/dL (ref 70–99)
Potassium: 3.8 mmol/L (ref 3.5–5.1)
Sodium: 131 mmol/L — ABNORMAL LOW (ref 135–145)

## 2020-10-25 LAB — RENAL FUNCTION PANEL
Albumin: 1.6 g/dL — ABNORMAL LOW (ref 3.5–5.0)
Anion gap: 11 (ref 5–15)
BUN: 31 mg/dL — ABNORMAL HIGH (ref 8–23)
CO2: 24 mmol/L (ref 22–32)
Calcium: 8.6 mg/dL — ABNORMAL LOW (ref 8.9–10.3)
Chloride: 96 mmol/L — ABNORMAL LOW (ref 98–111)
Creatinine, Ser: 7.11 mg/dL — ABNORMAL HIGH (ref 0.44–1.00)
GFR, Estimated: 6 mL/min — ABNORMAL LOW (ref >60)
Glucose, Bld: 80 mg/dL (ref 70–99)
Phosphorus: 3.1 mg/dL (ref 2.5–4.6)
Potassium: 4.1 mmol/L (ref 3.5–5.1)
Sodium: 131 mmol/L — ABNORMAL LOW (ref 135–145)

## 2020-10-25 LAB — MAGNESIUM: Magnesium: 1.9 mg/dL (ref 1.7–2.4)

## 2020-10-25 LAB — PATHOLOGIST SMEAR REVIEW

## 2020-10-25 MED ORDER — LIDOCAINE HCL (PF) 1 % IJ SOLN
INTRAMUSCULAR | Status: DC | PRN
  Administered 2020-10-25: 10 mL

## 2020-10-25 MED ORDER — DARBEPOETIN ALFA 100 MCG/0.5ML IJ SOSY
PREFILLED_SYRINGE | INTRAMUSCULAR | Status: AC
  Filled 2020-10-25: qty 0.5

## 2020-10-25 MED ORDER — SODIUM CHLORIDE 0.9 % IV SOLN: 100.0000 mL | INTRAVENOUS | Status: DC | PRN

## 2020-10-25 MED ORDER — HEPARIN SODIUM (PORCINE) 1000 UNIT/ML DIALYSIS: 1000.0000 [IU] | INTRAMUSCULAR | Status: DC | PRN

## 2020-10-25 MED ORDER — LIDOCAINE-PRILOCAINE 2.5-2.5 % EX CREA: 1.0000 "application " | TOPICAL_CREAM | CUTANEOUS | Status: DC | PRN

## 2020-10-25 MED ORDER — HEPARIN SODIUM (PORCINE) 1000 UNIT/ML DIALYSIS
1000.0000 [IU] | INTRAMUSCULAR | Status: DC | PRN
  Administered 2020-10-25: 1000 [IU] via INTRAVENOUS_CENTRAL

## 2020-10-25 MED ORDER — PENTAFLUOROPROP-TETRAFLUOROETH EX AERO: 1.0000 "application " | INHALATION_SPRAY | CUTANEOUS | Status: DC | PRN

## 2020-10-25 MED ORDER — LIDOCAINE HCL (PF) 1 % IJ SOLN: 5.0000 mL | INTRAMUSCULAR | Status: DC | PRN

## 2020-10-25 MED ORDER — ALTEPLASE 2 MG IJ SOLR: 2.0000 mg | Freq: Once | INTRAMUSCULAR | Status: DC | PRN

## 2020-10-25 MED ORDER — LIDOCAINE HCL 1 % IJ SOLN
INTRAMUSCULAR | Status: AC
  Filled 2020-10-25: qty 20

## 2020-10-25 NOTE — Procedures (Signed)
PROCEDURE SUMMARY:  Successful US guided right thoracentesis. Yielded 700 mL of clear yellow fluid. Pt tolerated procedure well. No immediate complications.  Specimen was not sent for labs. CXR ordered.  EBL < 5 mL  Ascencion Dike PA-C 10/25/2020 4:11 PM

## 2020-10-25 NOTE — Progress Notes (Signed)
Patient oxygen sat of 93% on 2L at rest. Patient sats dropped to 80% on 2L while ambulating. Patient needed to be placed on 6L while ambulating to maintain an oxygen sat of 90%.

## 2020-10-25 NOTE — Progress Notes (Addendum)
[facility] Progress Note   Subjective:    Seen on HD this AM Had 1L thoracentesis yesterday.  Says feeling better - moved around room much easier after thora yesterday. Says "ate good" last PM .  Objective Vitals:   10/25/20 0735 10/25/20 0800 10/25/20 0830 10/25/20 0900  BP: 134/65 (!) 144/56 (!) 146/60 (!) 149/69  Pulse: 83 88 83 84  Resp:      Temp:      TempSrc:      SpO2:      Weight:      Height:       Physical Exam General: frail,nontoxic appearing, nad on nasal oxygen  - seems a bit brighter today Heart: RRR Lungs: Normal WOB, diminished,  no rales, no wheeze  Abdomen: soft Extremities:trace LE edema bilaterally  Dialysis Access:  RIJ TDC c/d/i, LUE AVF +t/b but small and some collaterals present  Additional Objective Labs: Basic Metabolic Panel: Recent Labs  Lab 10/22/20 0130 10/23/20 0808 10/25/20 0203  NA 136 136 131*  K 4.4 3.2* 3.8  CL 98 99 97*  CO2 26 28 26   GLUCOSE 98 93 99  BUN 31* 14 28*  CREATININE 6.76* 4.08* 6.73*  CALCIUM 9.3 8.6* 8.6*  PHOS  --  2.2*  --    Liver Function Tests: Recent Labs  Lab 10/22/20 0130 10/23/20 0808  AST 32  --   ALT 21  --   ALKPHOS 69  --   BILITOT 1.1  --   PROT 6.0*  --   ALBUMIN 2.3* 1.8*   No results for input(s): LIPASE, AMYLASE in the last 168 hours. CBC: Recent Labs  Lab 10/21/20 1827 10/21/20 1827 10/22/20 0130 10/22/20 0130 10/23/20 0417 10/25/20 0707 10/25/20 0817  WBC 15.4*   < > 16.1*   < > 15.7* 16.3* 16.3*  NEUTROABS  --   --   --   --  12.9*  --   --   HGB 9.2*   < > 9.5*   < > 8.3* 8.1* 8.0*  HCT 31.5*   < > 30.1*   < > 26.5* 25.1* 24.8*  MCV 99.7  --  98.4  --  98.5 95.4 99.2  PLT 303   < > 298   < > 237 260 262   < > = values in this interval not displayed.   Blood Culture    Component Value Date/Time   SDES FLUID 10/24/2020 1121   SPECREQUEST NONE 10/24/2020 1121   CULT  10/24/2020 1121    NO GROWTH < 24 HOURS Performed at [facility],  [place] [address], [place], [place] [address]    REPTSTATUS PENDING 10/24/2020 1121    Cardiac Enzymes: No results for input(s): CKTOTAL, CKMB, CKMBINDEX, TROPONINI in the last 168 hours. CBG: Recent Labs  Lab 10/21/20 2010  GLUCAP 109*   Iron Studies: No results for input(s): IRON, TIBC, TRANSFERRIN, FERRITIN in the last 72 hours. @lablastinr3 @ Studies/Results: DG Chest 1 View  Result Date: 10/24/2020 CLINICAL DATA:  Post thoracentesis today EXAM: CHEST  1 VIEW COMPARISON:  10/23/2020 FINDINGS: Significant improvement in bilateral pleural effusions with near complete clearing of left pleural fluid and small residual right pleural effusion. Improved aeration in both lung bases persistent right lower lobe atelectasis. Negative for pneumothorax. Improvement in vascular congestion and edema. Dialysis catheter in the lower SVC unchanged. IMPRESSION: Improvement in bilateral pleural effusions and bilateral edema. No pneumothorax post thoracentesis. Electronically Signed   By: Franchot Gallo  M.D.   On: 10/24/2020 11:21   IR THORACENTESIS ASP PLEURAL SPACE W/IMG GUIDE  Result Date: 10/24/2020 INDICATION: Patient with ESRD in fluid overload, acute pulmonary edema, dyspnea, and bilateral pleural effusions, L>R. Request is made for diagnostic and therapeutic left thoracentesis. EXAM: ULTRASOUND GUIDED DIAGNOSTIC AND THERAPEUTIC LEFT THORACENTESIS MEDICATIONS: 10 mL 1% lidocaine COMPLICATIONS: None immediate. PROCEDURE: An ultrasound guided thoracentesis was thoroughly discussed with the patient and questions answered. The benefits, risks, alternatives and complications were also discussed. The patient understands and wishes to proceed with the procedure. Written consent was obtained. Ultrasound was performed to localize and mark an adequate pocket of fluid in the left chest. The area was then prepped and draped in the normal sterile fashion. 1% Lidocaine was used for local anesthesia. Under ultrasound guidance  a 6 Fr Safe-T-Centesis catheter was introduced. Thoracentesis was performed. The catheter was removed and a dressing applied. FINDINGS: A total of approximately 1 L of clear gold fluid was removed. Samples were sent to the laboratory as requested by the clinical team. IMPRESSION: Successful ultrasound guided left thoracentesis yielding 1 L of pleural fluid. Read by: Earley Abide, PA-C Electronically Signed   By: Ruthann Cancer MD   On: 10/24/2020 11:54   Medications: . sodium chloride    . sodium chloride     . amLODipine  10 mg Oral Daily  . carvedilol  12.5 mg Oral BID WC  . Chlorhexidine Gluconate Cloth  6 each Topical Q0600  . darbepoetin (ARANESP) injection - DIALYSIS  100 mcg Intravenous Q Fri-HD  . heparin  5,000 Units Subcutaneous Q8H  . irbesartan  300 mg Oral Daily  . rosuvastatin  10 mg Oral Daily    Dialysis Orders: Dialyzes at [place] MWF 4 hours  EDW 62- getting there. HD Bath 2/2.5, Dialyzer 180, Heparin no. Access [facility]- has left AVF that can be used after 11/25. mircera100 q 2 weeks, (last 10/27)  venofer 100 q tx right now 11/3 hgb 8.3 11/1 k 4  Assessment/Plan: 79 year old female with copd and ESRD presenting with weakness and an increased O2 requirement 1 Hypoxia- has a baseline O2 req 2L, req 4L here.  Working on UF with HD and also had thoracentesis.  COntinue to push EDW down with HD today.  2 ESRD: normally MWF at [facility] via [facility]-  Has only been on HD since August.  Missed Mon. HD 11/9 and 11/10. Next HD today 4K bath. 3-3.5L UF  Request standing wt post HD, d/w RN who will obtain. 3 Hypertension: hypertensive on admission and by CXR is volume overloaded.  BP improving. Continue volume lowering with HD  4. Anemia of ESRD: has been on tx with mircera and venofer at OP center-  Hgb 9.5>8.3. Due for ESA redose - ordered with HD 11/12 5. Metabolic Bone Disease: Ca/Phos ok. is not on vitamin D or sensipar at OP unit.  No binder on OP med list    Jannifer Hick MD [facility] Pager [phone]

## 2020-10-25 NOTE — Progress Notes (Signed)
PT Cancellation Note  Patient Details Name: Cassandra Dyer MRN: [id number] DOB: November 13, 1941   Cancelled Treatment:    Reason Eval/Treat Not Completed: (P) Patient at procedure or test/unavailable (Pt in HD will f/u per POC.)   Jarrad Mclees J Stann Mainland 10/25/2020, 9:44 AM  Erasmo Leventhal , PTA Acute Rehabilitation Services Pager [phone] Office [phone]

## 2020-10-25 NOTE — Consult Note (Signed)
   [facility] Inpatient Consult   10/25/2020  Cassandra Dyer 12-11-1941 [id number]  [facility] [ACO] Patient: Medicare NextGen  Patient is currently active with [facility] for chronic disease management services.  Patient has been engaged by a [facility].  Our community based plan of care has focused on disease management and community resource support.   Patient was in HD.  Patient has an extreme high risk score 38% with 3 admissions in the past 6 months noted in EMR. Met with patient and family member at bedside and verbalized ongoing follow up with [facility] Coordinator,  Denies any new follow up issues at this time. Plan: Continue to follow with Inpatient Transition Of Care [TOC] team member to make aware that [facility] following.   Of note, [facility] Care Management services does not replace or interfere with any services that are needed or arranged by inpatient [facility] care management team.  For additional questions or referrals please contact:  Natividad Brood, RN BSN [facility] Liaison  [phone] business mobile phone Toll free office [phone]  Fax number: [phone] [email or web address]

## 2020-10-25 NOTE — Progress Notes (Signed)
PROGRESS NOTE    [name]  OJJ:[id number] DOB: February 15, 1941 DOA: 10/21/2020 PCP: Cassandra Borg, MD    Brief Narrative:  Cassandra Dyer is a 79 year old female with past medical history significant for ESRD on HD, history of renal cell carcinoma right kidney status post nephrectomy, essential hypertension, anemia of chronic kidney disease who presented to the ED with complaint of shortness of breath, weakness.  Missed hemodialysis on Monday.  Baseline oxygen requirement of 2 L but now up to 4 L to maintain adequate oxygenation.  In the ED, chest x-ray notable for bilateral pleural effusions and interstitial edema.  EDP consulted hospitalist group for admission secondary to acute on chronic hypoxic respiratory failure secondary to volume overload/pulmonary edema/pleural effusion.   Assessment & Plan:   Principal Problem:   Pulmonary edema Active Problems:   Essential hypertension   Renal cell carcinoma of right kidney (HCC)   ESRD (end stage renal disease) (HCC)   COPD (chronic obstructive pulmonary disease) (HCC)   Acute pulmonary edema (HCC)   Acute respiratory failure with hypoxia (HCC)   Acute on chronic hypoxic respiratory failure 2/2 pulmonary edema and bilateral pleural effusions COPD Patient presenting from home with progressive shortness of breath, missed HD session.  Chest x-ray on admission notable for bilateral pleural effusions, interstitial edema from volume overload.  Baseline oxygen 2 L per nasal cannula, Continues on 4 L per nasal cannula.  Underwent ultrasound-guided left thoracentesis with 1 L fluid removed this morning. --Continue HD per nephrology --Fluid restriction 1.2 L daily --net negative 6.5L since admission --Continue supplemental oxygen, titrate to maintain SPO2 greater than 88%; currently on 4L [place] --Oxygen desaturation screen today on her home 2 L nasal cannula --May need right-sided thoracentesis if continues to require higher levels of  supplemental oxygen.  Essential hypertension BP 141/52 --Carvedilol 12.5 mg p.o. twice daily, irbesartan 300 mg p.o. daily, amlodipine 10 mg p.o. daily  HLD: Continue Crestor 10 mg p.o. daily  Anemia of chronic kidney disease Hemoglobin 8.3, stable.   DVT prophylaxis: SCDs Code Status: Full code Family Communication: Updated Sister was present at bedside.  Disposition Plan:  Status is: Inpatient  Remains inpatient appropriate because:Unsafe d/c plan and Inpatient level of care appropriate due to severity of illness   Dispo: The patient is from: Home              Anticipated d/c is to: Home              Anticipated d/c date is: 1 day              Patient currently is not medically stable to d/c.   Consultants:   Nephrology  Interventional radiology  Procedures:  Thoracentesis, left 10/24/2020; 1L removed  Antimicrobials:   None   Subjective: Patient seen and examined in HD, slightly fatigued this morning.  Continues on 4 L nasal cannula with SPO2 95%.  Baseline 2 L per nasal cannula.  States dyspnea has improved since thoracentesis.  Will need amatory oxygenation screening on her home 2 L to evaluate whether needs further thoracentesis on the right.  Sister present at bedside and updated on plan of care.  No other complaints or concerns at this time.  Denies headache, no dizziness, no chest pain, no palpitations, no abdominal pain, no nausea/vomiting/diarrhea.  No acute events overnight per nursing staff.  Objective: Vitals:   10/25/20 1100 10/25/20 1135 10/25/20 1145 10/25/20 1205  BP: (!) 127/54 134/63 132/62 (!) 128/55  Pulse: 87  88 89 88  Resp: 11 (!) 36  (!) 24  Temp:   97.9 F (36.6 C) 98.4 F (36.9 C)  TempSrc:   Oral   SpO2:  98%  100%  Weight:  56.9 kg    Height:        Intake/Output Summary (Last 24 hours) at 10/25/2020 1405 Last data filed at 10/25/2020 1135 Gross per 24 hour  Intake --  Output 3000 ml  Net -3000 ml   Filed Weights    10/24/20 0500 10/25/20 0725 10/25/20 1135  Weight: 61.4 kg 61.3 kg 56.9 kg    Examination:  General exam: Appears calm and comfortable  Respiratory system: Clear to auscultation. Respiratory effort normal.  On 4 L nasal cannula with SPO2 95% (baseline 2LNC) Cardiovascular system: S1 & S2 heard, RRR. No JVD, murmurs, rubs, gallops or clicks. No pedal edema.  Other noted Gastrointestinal system: Abdomen is nondistended, soft and nontender. No organomegaly or masses felt. Normal bowel sounds heard. Central nervous system: Alert and oriented. No focal neurological deficits. Extremities: Symmetric 5 x 5 power. Skin: No rashes, lesions or ulcers Psychiatry: Judgement and insight appear normal. Mood & affect appropriate.     Data Reviewed: I have personally reviewed following labs and imaging studies  CBC: Recent Labs  Lab 10/21/20 1827 10/22/20 0130 10/23/20 0417 10/25/20 0707 10/25/20 0817  WBC 15.4* 16.1* 15.7* 16.3* 16.3*  NEUTROABS  --   --  12.9*  --   --   HGB 9.2* 9.5* 8.3* 8.1* 8.0*  HCT 31.5* 30.1* 26.5* 25.1* 24.8*  MCV 99.7 98.4 98.5 95.4 99.2  PLT 303 298 237 260 637   Basic Metabolic Panel: Recent Labs  Lab 10/21/20 1827 10/22/20 0130 10/23/20 0808 10/25/20 0203 10/25/20 0707  NA 133* 136 136 131* 131*  K 4.3 4.4 3.2* 3.8 4.1  CL 95* 98 99 97* 96*  CO2 24 26 28 26 24   GLUCOSE 165* 98 93 99 80  BUN 28* 31* 14 28* 31*  CREATININE 6.42* 6.76* 4.08* 6.73* 7.11*  CALCIUM 9.2 9.3 8.6* 8.6* 8.6*  MG  --   --   --  1.9  --   PHOS  --   --  2.2*  --  3.1   GFR: Estimated Creatinine Clearance: 5.2 mL/min (A) (by C-G formula based on SCr of 7.11 mg/dL (H)). Liver Function Tests: Recent Labs  Lab 10/22/20 0130 10/23/20 0808 10/25/20 0707  AST 32  --   --   ALT 21  --   --   ALKPHOS 69  --   --   BILITOT 1.1  --   --   PROT 6.0*  --   --   ALBUMIN 2.3* 1.8* 1.6*   No results for input(s): LIPASE, AMYLASE in the last 168 hours. No results for input(s):  AMMONIA in the last 168 hours. Coagulation Profile: No results for input(s): INR, PROTIME in the last 168 hours. Cardiac Enzymes: No results for input(s): CKTOTAL, CKMB, CKMBINDEX, TROPONINI in the last 168 hours. BNP (last 3 results) No results for input(s): PROBNP in the last 8760 hours. HbA1C: No results for input(s): HGBA1C in the last 72 hours. CBG: Recent Labs  Lab 10/21/20 2010  GLUCAP 109*   Lipid Profile: No results for input(s): CHOL, HDL, LDLCALC, TRIG, CHOLHDL, LDLDIRECT in the last 72 hours. Thyroid Function Tests: No results for input(s): TSH, T4TOTAL, FREET4, T3FREE, THYROIDAB in the last 72 hours. Anemia Panel: No results for input(s): VITAMINB12, FOLATE, FERRITIN, TIBC,  IRON, RETICCTPCT in the last 72 hours. Sepsis Labs: No results for input(s): PROCALCITON, LATICACIDVEN in the last 168 hours.  Recent Results (from the past 240 hour(s))  Respiratory Panel by RT PCR (Flu A&B, Covid) - Nasopharyngeal Swab     Status: None   Collection Time: 10/21/20  6:04 PM   Specimen: Nasopharyngeal Swab  Result Value Ref Range Status   SARS Coronavirus 2 by RT PCR NEGATIVE NEGATIVE Final    Comment: (NOTE) SARS-CoV-2 target nucleic acids are NOT DETECTED.  The SARS-CoV-2 RNA is generally detectable in upper respiratoy specimens during the acute phase of infection. The lowest concentration of SARS-CoV-2 viral copies this assay can detect is 131 copies/mL. A negative result does not preclude SARS-Cov-2 infection and should not be used as the sole basis for treatment or other patient management decisions. A negative result may occur with  improper specimen collection/handling, submission of specimen other than nasopharyngeal swab, presence of viral mutation(s) within the areas targeted by this assay, and inadequate number of viral copies (<131 copies/mL). A negative result must be combined with clinical observations, patient history, and epidemiological information.  The expected result is Negative.  Fact Sheet for Patients:  PinkCheek.be  Fact Sheet for Healthcare Providers:  GravelBags.it  This test is no t yet approved or cleared by the [place] FDA and  has been authorized for detection and/or diagnosis of SARS-CoV-2 by FDA under an Emergency Use Authorization (EUA). This EUA will remain  in effect (meaning this test can be used) for the duration of the COVID-19 declaration under Section 564(b)(1) of the Act, 21 U.S.C. section 360bbb-3(b)(1), unless the authorization is terminated or revoked sooner.     Influenza A by PCR NEGATIVE NEGATIVE Final   Influenza B by PCR NEGATIVE NEGATIVE Final    Comment: (NOTE) The Xpert Xpress SARS-CoV-2/FLU/RSV assay is intended as an aid in  the diagnosis of influenza from Nasopharyngeal swab specimens and  should not be used as a sole basis for treatment. Nasal washings and  aspirates are unacceptable for Xpert Xpress SARS-CoV-2/FLU/RSV  testing.  Fact Sheet for Patients: PinkCheek.be  Fact Sheet for Healthcare Providers: GravelBags.it  This test is not yet approved or cleared by the [place] FDA and  has been authorized for detection and/or diagnosis of SARS-CoV-2 by  FDA under an Emergency Use Authorization (EUA). This EUA will remain  in effect (meaning this test can be used) for the duration of the  Covid-19 declaration under Section 564(b)(1) of the Act, 21  U.S.C. section 360bbb-3(b)(1), unless the authorization is  terminated or revoked. Performed at [facility], [place] [address], [place], [place] [address]   Gram stain     Status: None   Collection Time: 10/24/20 11:20 AM   Specimen: Lung, Left; Pleural Fluid  Result Value Ref Range Status   Specimen Description LUNG  Final   Special Requests NONE  Final   Gram Stain   Final    WBC PRESENT,BOTH PMN AND  MONONUCLEAR NO ORGANISMS SEEN CYTOSPIN SMEAR Performed at [facility], [address], [place],  [address]    Report Status 10/24/2020 FINAL  Final  Culture, body fluid-bottle     Status: None (Preliminary result)   Collection Time: 10/24/20 11:21 AM   Specimen: Fluid  Result Value Ref Range Status   Specimen Description FLUID  Final   Special Requests NONE  Final   Culture   Final    NO GROWTH < 24 HOURS  Performed at [facility], [place] [address], [place], [place] [address]    Report Status PENDING  Incomplete         Radiology Studies: DG Chest 1 View  Result Date: 10/24/2020 CLINICAL DATA:  Post thoracentesis today EXAM: CHEST  1 VIEW COMPARISON:  10/23/2020 FINDINGS: Significant improvement in bilateral pleural effusions with near complete clearing of left pleural fluid and small residual right pleural effusion. Improved aeration in both lung bases persistent right lower lobe atelectasis. Negative for pneumothorax. Improvement in vascular congestion and edema. Dialysis catheter in the lower SVC unchanged. IMPRESSION: Improvement in bilateral pleural effusions and bilateral edema. No pneumothorax post thoracentesis. Electronically Signed   By: Franchot Gallo M.D.   On: 10/24/2020 11:21   IR THORACENTESIS ASP PLEURAL SPACE W/IMG GUIDE  Result Date: 10/24/2020 INDICATION: Patient with ESRD in fluid overload, acute pulmonary edema, dyspnea, and bilateral pleural effusions, L>R. Request is made for diagnostic and therapeutic left thoracentesis. EXAM: ULTRASOUND GUIDED DIAGNOSTIC AND THERAPEUTIC LEFT THORACENTESIS MEDICATIONS: 10 mL 1% lidocaine COMPLICATIONS: None immediate. PROCEDURE: An ultrasound guided thoracentesis was thoroughly discussed with the patient and questions answered. The benefits, risks, alternatives and complications were also discussed. The patient understands and wishes to proceed with the procedure. Written consent was obtained. Ultrasound was  performed to localize and mark an adequate pocket of fluid in the left chest. The area was then prepped and draped in the normal sterile fashion. 1% Lidocaine was used for local anesthesia. Under ultrasound guidance a 6 Fr Safe-T-Centesis catheter was introduced. Thoracentesis was performed. The catheter was removed and a dressing applied. FINDINGS: A total of approximately 1 L of clear gold fluid was removed. Samples were sent to the laboratory as requested by the clinical team. IMPRESSION: Successful ultrasound guided left thoracentesis yielding 1 L of pleural fluid. Read by: Earley Abide, PA-C Electronically Signed   By: Ruthann Cancer MD   On: 10/24/2020 11:54        Scheduled Meds: . amLODipine  10 mg Oral Daily  . carvedilol  12.5 mg Oral BID WC  . Chlorhexidine Gluconate Cloth  6 each Topical Q0600  . Darbepoetin Alfa      . darbepoetin (ARANESP) injection - DIALYSIS  100 mcg Intravenous Q Fri-HD  . heparin  5,000 Units Subcutaneous Q8H  . irbesartan  300 mg Oral Daily  . rosuvastatin  10 mg Oral Daily   Continuous Infusions:   LOS: 2 days    Time spent: 34 minutes spent on chart review, discussion with nursing staff, consultants, updating family and interview/physical exam; more than 50% of that time was spent in counseling and/or coordination of care.    Homero Hyson J [place] DO [facility] Available via Epic secure chat 7am-7pm After these hours, please refer to coverage provider listed on [email or web address] 10/25/2020, 2:05 PM

## 2020-10-26 DIAGNOSIS — N186 End stage renal disease: Secondary | ICD-10-CM | POA: Diagnosis not present

## 2020-10-26 DIAGNOSIS — J449 Chronic obstructive pulmonary disease, unspecified: Secondary | ICD-10-CM | POA: Diagnosis not present

## 2020-10-26 DIAGNOSIS — J810 Acute pulmonary edema: Secondary | ICD-10-CM | POA: Diagnosis not present

## 2020-10-26 DIAGNOSIS — J9601 Acute respiratory failure with hypoxia: Secondary | ICD-10-CM | POA: Diagnosis not present

## 2020-10-26 NOTE — Progress Notes (Signed)
[facility] Progress Note   Subjective:   Patient seen and examined at bedside.  Reports feeling much better today. Breathing improved.  Now on 2L O2 via [place] while resting in bed.  Had another thoracentesis yesterday with next yield 755mL clear yellow fluid. Denies n/v/d, abdominal pain, edema, CP and palpitations.   Objective Vitals:   10/25/20 1725 10/25/20 2243 10/26/20 0500 10/26/20 0544  BP: (!) 125/53 (!) 119/53  (!) 163/52  Pulse: 88 76  87  Resp: (!) 25 19  18   Temp: 99.3 F (37.4 C) 98.9 F (37.2 C)  98.8 F (37.1 C)  TempSrc: Oral Oral  Oral  SpO2: 100% 100%  99%  Weight:   57.2 kg   Height:       Physical Exam General: frail, elderly female in NAD Heart:RRR Lungs:BS diminished, nml WOB Abdomen:soft, NTND Extremities:trace LE edema, L>R Dialysis Access: R IJ TDC c/d/i, LU AVF +b (small w/collaterals)   Filed Weights   10/25/20 0725 10/25/20 1135 10/26/20 0500  Weight: 61.3 kg 56.9 kg 57.2 kg    Intake/Output Summary (Last 24 hours) at 10/26/2020 1005 Last data filed at 10/25/2020 1135 Gross per 24 hour  Intake --  Output 3000 ml  Net -3000 ml    Additional Objective Labs: Basic Metabolic Panel: Recent Labs  Lab 10/23/20 0808 10/25/20 0203 10/25/20 0707  NA 136 131* 131*  K 3.2* 3.8 4.1  CL 99 97* 96*  CO2 28 26 24   GLUCOSE 93 99 80  BUN 14 28* 31*  CREATININE 4.08* 6.73* 7.11*  CALCIUM 8.6* 8.6* 8.6*  PHOS 2.2*  --  3.1   Liver Function Tests: Recent Labs  Lab 10/22/20 0130 10/23/20 0808 10/25/20 0707  AST 32  --   --   ALT 21  --   --   ALKPHOS 69  --   --   BILITOT 1.1  --   --   PROT 6.0*  --   --   ALBUMIN 2.3* 1.8* 1.6*   CBC: Recent Labs  Lab 10/21/20 1827 10/21/20 1827 10/22/20 0130 10/22/20 0130 10/23/20 0417 10/25/20 0707 10/25/20 0817  WBC 15.4*   < > 16.1*   < > 15.7* 16.3* 16.3*  NEUTROABS  --   --   --   --  12.9*  --   --   HGB 9.2*   < > 9.5*   < > 8.3* 8.1* 8.0*  HCT 31.5*   < > 30.1*   < >  26.5* 25.1* 24.8*  MCV 99.7  --  98.4  --  98.5 95.4 99.2  PLT 303   < > 298   < > 237 260 262   < > = values in this interval not displayed.   Blood Culture    Component Value Date/Time   SDES FLUID 10/24/2020 1121   SPECREQUEST NONE 10/24/2020 1121   CULT  10/24/2020 1121    NO GROWTH 2 DAYS Performed at [facility], [place] [address] [place], [place] [address]    REPTSTATUS PENDING 10/24/2020 1121    Studies/Results: DG Chest 1 View  Result Date: 10/25/2020 CLINICAL DATA:  79 year old female with a history of thoracentesis EXAM: CHEST  1 VIEW COMPARISON:  10/24/2020, 10/23/2020 FINDINGS: Cardiomediastinal silhouette unchanged in size and contour with the heart borders partially obscured by overlying lung and pleural disease. Decreasing interlobular septal thickening. Unchanged right IJ tunneled hemodialysis catheter. Improved opacity at the right lung base with minimal blunting at  the right costophrenic angle. No pneumothorax. Opacity at the left lung base is increased from the most recent comparison, though not as severe as the study dated 10/23/2020. No pneumothorax on the left. IMPRESSION: Decreased right-sided pleural effusion with no pneumothorax status post right thoracentesis. Recurrent left-sided pleural effusion and associated atelectasis/consolidation. Unchanged right IJ tunneled hemodialysis catheter. Improving pulmonary edema. Electronically Signed   By: Corrie Mckusick D.O.   On: 10/25/2020 16:40   DG Chest 1 View  Result Date: 10/24/2020 CLINICAL DATA:  Post thoracentesis today EXAM: CHEST  1 VIEW COMPARISON:  10/23/2020 FINDINGS: Significant improvement in bilateral pleural effusions with near complete clearing of left pleural fluid and small residual right pleural effusion. Improved aeration in both lung bases persistent right lower lobe atelectasis. Negative for pneumothorax. Improvement in vascular congestion and edema. Dialysis catheter in the lower SVC unchanged.  IMPRESSION: Improvement in bilateral pleural effusions and bilateral edema. No pneumothorax post thoracentesis. Electronically Signed   By: Franchot Gallo M.D.   On: 10/24/2020 11:21   IR THORACENTESIS ASP PLEURAL SPACE W/IMG GUIDE  Result Date: 10/25/2020 INDICATION: Shortness of breath. Right-sided pleural effusion. Request for therapeutic thoracentesis. EXAM: ULTRASOUND GUIDED RIGHT THORACENTESIS MEDICATIONS: 1% plain lidocaine, 5 mL COMPLICATIONS: None immediate. PROCEDURE: An ultrasound guided thoracentesis was thoroughly discussed with the patient and questions answered. The benefits, risks, alternatives and complications were also discussed. The patient understands and wishes to proceed with the procedure. Written consent was obtained. Ultrasound was performed to localize and mark an adequate pocket of fluid in the right chest. The area was then prepped and draped in the normal sterile fashion. 1% Lidocaine was used for local anesthesia. Under ultrasound guidance a 6 Fr Safe-T-Centesis catheter was introduced. Thoracentesis was performed. The catheter was removed and a dressing applied. FINDINGS: A total of approximately 700 mL of clear yellow fluid was removed. IMPRESSION: Successful ultrasound guided right thoracentesis yielding 700 mL of pleural fluid. Read by: Ascencion Dike PA-C Electronically Signed   By: Aletta Edouard M.D.   On: 10/25/2020 16:35   IR THORACENTESIS ASP PLEURAL SPACE W/IMG GUIDE  Result Date: 10/24/2020 INDICATION: Patient with ESRD in fluid overload, acute pulmonary edema, dyspnea, and bilateral pleural effusions, L>R. Request is made for diagnostic and therapeutic left thoracentesis. EXAM: ULTRASOUND GUIDED DIAGNOSTIC AND THERAPEUTIC LEFT THORACENTESIS MEDICATIONS: 10 mL 1% lidocaine COMPLICATIONS: None immediate. PROCEDURE: An ultrasound guided thoracentesis was thoroughly discussed with the patient and questions answered. The benefits, risks, alternatives and complications  were also discussed. The patient understands and wishes to proceed with the procedure. Written consent was obtained. Ultrasound was performed to localize and mark an adequate pocket of fluid in the left chest. The area was then prepped and draped in the normal sterile fashion. 1% Lidocaine was used for local anesthesia. Under ultrasound guidance a 6 Fr Safe-T-Centesis catheter was introduced. Thoracentesis was performed. The catheter was removed and a dressing applied. FINDINGS: A total of approximately 1 L of clear gold fluid was removed. Samples were sent to the laboratory as requested by the clinical team. IMPRESSION: Successful ultrasound guided left thoracentesis yielding 1 L of pleural fluid. Read by: Earley Abide, PA-C Electronically Signed   By: Ruthann Cancer MD   On: 10/24/2020 11:54    Medications:  . amLODipine  10 mg Oral Daily  . carvedilol  12.5 mg Oral BID WC  . Chlorhexidine Gluconate Cloth  6 each Topical Q0600  . darbepoetin (ARANESP) injection - DIALYSIS  100 mcg Intravenous Q Fri-HD  .  heparin  5,000 Units Subcutaneous Q8H  . irbesartan  300 mg Oral Daily  . rosuvastatin  10 mg Oral Daily    [facility] getting there. HD Bath2/2.5, Dialyzer180, Heparinno. AccessTDC- has left AVF that can be used after 11/25. mircera100 q 2 weeks, (last 10/27)  venofer 100 q tx right now 11/3 hgb 8.3 11/1 k 4  Background:  79 year old female with COPD and ESRD presenting with weakness and increased O2 requirement.   Assessment/Plan: 1Hypoxia- has a baseline O2 req 2L, require 6L yesterday with ambulation. Working on UF with HD and also had 2 thoracentesis.  Volume status improving. CXR yesterday showed improved pulmonary edema and R pleural effusion. Down to ~56.9kg post HD yesterday, continue to titrate down as tolerated.  Will need lower EDW on d/c.  2 ESRD:normally MWF at [facility] via [facility]- Has only been on HD since August. Missed Mon. Ran off  schedule 11/9 and resumed regular schedule 11/10.  Next HD 11/15.  Continue standing weights post HD. K 4.1 3 Hypertension:hypertensive on admission and by CXR is volume overloaded. BP improved but increased today. Continue home meds and volume lowering with HD  4. Anemia of ESRD:has been on tx with mircera and venofer at OP center- Hgb 9.5>8.3. Due for ESA redose - Aranesp 136mcg given with HD 11/12 5. Metabolic Bone Disease:Ca/Phos ok. is not on vitamin D or sensipar at OP unit. No binder on OP med list 6. Nutrition: Renal diet w/fluid restrictions.   Jen Mow, PA-C [facility] 10/26/2020,10:05 AM  LOS: 3 days

## 2020-10-26 NOTE — Plan of Care (Signed)
  Problem: Education: Goal: Knowledge of General Education information will improve Description: Including pain rating scale, medication(s)/side effects and non-pharmacologic comfort measures Outcome: Progressing   Problem: Clinical Measurements: Goal: Will remain free from infection Outcome: Progressing   Problem: Activity: Goal: Risk for activity intolerance will decrease Outcome: Progressing   

## 2020-10-26 NOTE — Progress Notes (Signed)
PROGRESS NOTE    Cassandra Dyer  EYC:[id number] DOB: Apr 27, 1941 DOA: 10/21/2020 PCP: Biagio Borg, MD    Brief Narrative:  Cassandra Dyer is a 79 year old female with past medical history significant for ESRD on HD, history of renal cell carcinoma right kidney status post nephrectomy, essential hypertension, anemia of chronic kidney disease who presented to the ED with complaint of shortness of breath, weakness.  Missed hemodialysis on Monday.  Baseline oxygen requirement of 2 L but now up to 4 L to maintain adequate oxygenation.  In the ED, chest x-ray notable for bilateral pleural effusions and interstitial edema.  EDP consulted hospitalist group for admission secondary to acute on chronic hypoxic respiratory failure secondary to volume overload/pulmonary edema/pleural effusion.   Assessment & Plan:   Principal Problem:   Pulmonary edema Active Problems:   Essential hypertension   Renal cell carcinoma of right kidney (HCC)   ESRD (end stage renal disease) (HCC)   COPD (chronic obstructive pulmonary disease) (HCC)   Acute pulmonary edema (HCC)   Acute respiratory failure with hypoxia (HCC)   Acute on chronic hypoxic respiratory failure 2/2 pulmonary edema and bilateral pleural effusions COPD Patient presenting from home with progressive shortness of breath, missed HD session.  Chest x-ray on admission notable for bilateral pleural effusions, interstitial edema from volume overload.  Baseline oxygen 2 L per nasal cannula, Continues on 4 L per nasal cannula.  Underwent ultrasound-guided left thoracentesis with 1 L fluid removed on 11/11 and right thoracentesis on 11/12 with 700 mL removed.  Lights criteria consistent with transudative effusion. --Fluid restriction 1.2 L daily --net negative 6.5L since admission --Continue supplemental oxygen, titrate to maintain SPO2 greater than 88%; currently on 2L [place] at rest but during ambulation today required 4 L to maintain SPO2 at 88% with  significant shortness of breath --Continue further volume management per nephrology with HD, next scheduled 11/15  Essential hypertension --Carvedilol 12.5 mg p.o. twice daily, irbesartan 300 mg p.o. daily, amlodipine 10 mg p.o. daily  HLD: Continue Crestor 10 mg p.o. daily  Anemia of chronic kidney disease Hemoglobin 10.9, stable.   DVT prophylaxis: SCDs Code Status: Full code Family Communication: No family present at bedside this morning.  Disposition Plan:  Status is: Inpatient  Remains inpatient appropriate because:Unsafe d/c plan and Inpatient level of care appropriate due to severity of illness   Dispo: The patient is from: Home              Anticipated d/c is to: Home              Anticipated d/c date is: 1 day              Patient currently is not medically stable to d/c.   Consultants:   Nephrology  Interventional radiology  Procedures:  Thoracentesis, left 10/24/2020; 1L removed Thoracentesis, right 10/25/2020; 700 mL removed  Antimicrobials:   None   Subjective: Patient seen and examined at bedside, sleeping but easily arousable.  Shortness of breath much improved after right-sided thoracentesis at rest, but continues with significant dyspnea on exertion.  During ambulation today, required 4 L to maintain SPO2 88%, but down to 2 L while at rest.  Not ready for discharge as her breathing not at baseline.  No family present.  No other complaints or concerns at this time. Denies headache, no dizziness, no chest pain, no palpitations, no abdominal pain, no nausea/vomiting/diarrhea.  No acute events overnight per nursing staff.  Objective: Vitals:  10/25/20 2243 10/26/20 0500 10/26/20 0544 10/26/20 1253  BP: (!) 119/53  (!) 163/52 124/67  Pulse: 76  87 84  Resp: 19  18 16   Temp: 98.9 F (37.2 C)  98.8 F (37.1 C) 99.6 F (37.6 C)  TempSrc: Oral  Oral Oral  SpO2: 100%  99% 97%  Weight:  57.2 kg    Height:       No intake or output data in the 24  hours ending 10/26/20 1348 Filed Weights   10/25/20 0725 10/25/20 1135 10/26/20 0500  Weight: 61.3 kg 56.9 kg 57.2 kg    Examination:  General exam: Appears calm and comfortable  Respiratory system: Breath sounds slightly decreased bilateral bases, otherwise clear to auscultation. Respiratory effort normal.  On 2 L nasal cannula with SPO2 99% at rest but SPO2 dropped to 82% on ambulation and required 4 L to maintain 88% (baseline 2LNC) Cardiovascular system: S1 & S2 heard, RRR. No JVD, murmurs, rubs, gallops or clicks. No pedal edema.  Other noted Gastrointestinal system: Abdomen is nondistended, soft and nontender. No organomegaly or masses felt. Normal bowel sounds heard. Central nervous system: Alert and oriented. No focal neurological deficits. Extremities: Symmetric 5 x 5 power. Skin: No rashes, lesions or ulcers Psychiatry: Judgement and insight appear normal. Mood & affect appropriate.     Data Reviewed: I have personally reviewed following labs and imaging studies  CBC: Recent Labs  Lab 10/21/20 1827 10/22/20 0130 10/23/20 0417 10/25/20 0707 10/25/20 0817  WBC 15.4* 16.1* 15.7* 16.3* 16.3*  NEUTROABS  --   --  12.9*  --   --   HGB 9.2* 9.5* 8.3* 8.1* 8.0*  HCT 31.5* 30.1* 26.5* 25.1* 24.8*  MCV 99.7 98.4 98.5 95.4 99.2  PLT 303 298 237 260 831   Basic Metabolic Panel: Recent Labs  Lab 10/21/20 1827 10/22/20 0130 10/23/20 0808 10/25/20 0203 10/25/20 0707  NA 133* 136 136 131* 131*  K 4.3 4.4 3.2* 3.8 4.1  CL 95* 98 99 97* 96*  CO2 24 26 28 26 24   GLUCOSE 165* 98 93 99 80  BUN 28* 31* 14 28* 31*  CREATININE 6.42* 6.76* 4.08* 6.73* 7.11*  CALCIUM 9.2 9.3 8.6* 8.6* 8.6*  MG  --   --   --  1.9  --   PHOS  --   --  2.2*  --  3.1   GFR: Estimated Creatinine Clearance: 5.2 mL/min (A) (by C-G formula based on SCr of 7.11 mg/dL (H)). Liver Function Tests: Recent Labs  Lab 10/22/20 0130 10/23/20 0808 10/25/20 0707  AST 32  --   --   ALT 21  --   --     ALKPHOS 69  --   --   BILITOT 1.1  --   --   PROT 6.0*  --   --   ALBUMIN 2.3* 1.8* 1.6*   No results for input(s): LIPASE, AMYLASE in the last 168 hours. No results for input(s): AMMONIA in the last 168 hours. Coagulation Profile: No results for input(s): INR, PROTIME in the last 168 hours. Cardiac Enzymes: No results for input(s): CKTOTAL, CKMB, CKMBINDEX, TROPONINI in the last 168 hours. BNP (last 3 results) No results for input(s): PROBNP in the last 8760 hours. HbA1C: No results for input(s): HGBA1C in the last 72 hours. CBG: Recent Labs  Lab 10/21/20 2010  GLUCAP 109*   Lipid Profile: No results for input(s): CHOL, HDL, LDLCALC, TRIG, CHOLHDL, LDLDIRECT in the last 72 hours. Thyroid Function Tests:  No results for input(s): TSH, T4TOTAL, FREET4, T3FREE, THYROIDAB in the last 72 hours. Anemia Panel: No results for input(s): VITAMINB12, FOLATE, FERRITIN, TIBC, IRON, RETICCTPCT in the last 72 hours. Sepsis Labs: No results for input(s): PROCALCITON, LATICACIDVEN in the last 168 hours.  Recent Results (from the past 240 hour(s))  Respiratory Panel by RT PCR (Flu A&B, Covid) - Nasopharyngeal Swab     Status: None   Collection Time: 10/21/20  6:04 PM   Specimen: Nasopharyngeal Swab  Result Value Ref Range Status   SARS Coronavirus 2 by RT PCR NEGATIVE NEGATIVE Final    Comment: (NOTE) SARS-CoV-2 target nucleic acids are NOT DETECTED.  The SARS-CoV-2 RNA is generally detectable in upper respiratoy specimens during the acute phase of infection. The lowest concentration of SARS-CoV-2 viral copies this assay can detect is 131 copies/mL. A negative result does not preclude SARS-Cov-2 infection and should not be used as the sole basis for treatment or other patient management decisions. A negative result may occur with  improper specimen collection/handling, submission of specimen other than nasopharyngeal swab, presence of viral mutation(s) within the areas targeted by this  assay, and inadequate number of viral copies (<131 copies/mL). A negative result must be combined with clinical observations, patient history, and epidemiological information. The expected result is Negative.  Fact Sheet for Patients:  PinkCheek.be  Fact Sheet for Healthcare Providers:  GravelBags.it  This test is no t yet approved or cleared by the [place] FDA and  has been authorized for detection and/or diagnosis of SARS-CoV-2 by FDA under an Emergency Use Authorization (EUA). This EUA will remain  in effect (meaning this test can be used) for the duration of the COVID-19 declaration under Section 564(b)(1) of the Act, 21 U.S.C. section 360bbb-3(b)(1), unless the authorization is terminated or revoked sooner.     Influenza A by PCR NEGATIVE NEGATIVE Final   Influenza B by PCR NEGATIVE NEGATIVE Final    Comment: (NOTE) The Xpert Xpress SARS-CoV-2/FLU/RSV assay is intended as an aid in  the diagnosis of influenza from Nasopharyngeal swab specimens and  should not be used as a sole basis for treatment. Nasal washings and  aspirates are unacceptable for Xpert Xpress SARS-CoV-2/FLU/RSV  testing.  Fact Sheet for Patients: PinkCheek.be  Fact Sheet for Healthcare Providers: GravelBags.it  This test is not yet approved or cleared by the [place] FDA and  has been authorized for detection and/or diagnosis of SARS-CoV-2 by  FDA under an Emergency Use Authorization (EUA). This EUA will remain  in effect (meaning this test can be used) for the duration of the  Covid-19 declaration under Section 564(b)(1) of the Act, 21  U.S.C. section 360bbb-3(b)(1), unless the authorization is  terminated or revoked. Performed at [facility], [place] [address], [place], [place] [address]   Gram stain     Status: None   Collection Time: 10/24/20 11:20 AM   Specimen:  Lung, Left; Pleural Fluid  Result Value Ref Range Status   Specimen Description LUNG  Final   Special Requests NONE  Final   Gram Stain   Final    WBC PRESENT,BOTH PMN AND MONONUCLEAR NO ORGANISMS SEEN CYTOSPIN SMEAR Performed at [facility], [address], [place], [place] [address]    Report Status 10/24/2020 FINAL  Final  Culture, body fluid-bottle     Status: None (Preliminary result)   Collection Time: 10/24/20 11:21 AM   Specimen: Fluid  Result Value Ref Range Status   Specimen Description  FLUID  Final   Special Requests NONE  Final   Culture   Final    NO GROWTH 2 DAYS Performed at [facility], [place] [address] [place], [place] [address]    Report Status PENDING  Incomplete         Radiology Studies: DG Chest 1 View  Result Date: 10/25/2020 CLINICAL DATA:  79 year old female with a history of thoracentesis EXAM: CHEST  1 VIEW COMPARISON:  10/24/2020, 10/23/2020 FINDINGS: Cardiomediastinal silhouette unchanged in size and contour with the heart borders partially obscured by overlying lung and pleural disease. Decreasing interlobular septal thickening. Unchanged right IJ tunneled hemodialysis catheter. Improved opacity at the right lung base with minimal blunting at the right costophrenic angle. No pneumothorax. Opacity at the left lung base is increased from the most recent comparison, though not as severe as the study dated 10/23/2020. No pneumothorax on the left. IMPRESSION: Decreased right-sided pleural effusion with no pneumothorax status post right thoracentesis. Recurrent left-sided pleural effusion and associated atelectasis/consolidation. Unchanged right IJ tunneled hemodialysis catheter. Improving pulmonary edema. Electronically Signed   By: Corrie Mckusick D.O.   On: 10/25/2020 16:40   IR THORACENTESIS ASP PLEURAL SPACE W/IMG GUIDE  Result Date: 10/25/2020 INDICATION: Shortness of breath. Right-sided pleural effusion. Request for therapeutic  thoracentesis. EXAM: ULTRASOUND GUIDED RIGHT THORACENTESIS MEDICATIONS: 1% plain lidocaine, 5 mL COMPLICATIONS: None immediate. PROCEDURE: An ultrasound guided thoracentesis was thoroughly discussed with the patient and questions answered. The benefits, risks, alternatives and complications were also discussed. The patient understands and wishes to proceed with the procedure. Written consent was obtained. Ultrasound was performed to localize and mark an adequate pocket of fluid in the right chest. The area was then prepped and draped in the normal sterile fashion. 1% Lidocaine was used for local anesthesia. Under ultrasound guidance a 6 Fr Safe-T-Centesis catheter was introduced. Thoracentesis was performed. The catheter was removed and a dressing applied. FINDINGS: A total of approximately 700 mL of clear yellow fluid was removed. IMPRESSION: Successful ultrasound guided right thoracentesis yielding 700 mL of pleural fluid. Read by: Ascencion Dike PA-C Electronically Signed   By: Aletta Edouard M.D.   On: 10/25/2020 16:35        Scheduled Meds: . amLODipine  10 mg Oral Daily  . carvedilol  12.5 mg Oral BID WC  . Chlorhexidine Gluconate Cloth  6 each Topical Q0600  . darbepoetin (ARANESP) injection - DIALYSIS  100 mcg Intravenous Q Fri-HD  . heparin  5,000 Units Subcutaneous Q8H  . irbesartan  300 mg Oral Daily  . rosuvastatin  10 mg Oral Daily   Continuous Infusions:   LOS: 3 days    Time spent: 34 minutes spent on chart review, discussion with nursing staff, consultants, updating family and interview/physical exam; more than 50% of that time was spent in counseling and/or coordination of care.    Naveena Eyman J [place] DO [facility] Available via Epic secure chat 7am-7pm After these hours, please refer to coverage provider listed on [email or web address] 10/26/2020, 1:48 PM

## 2020-10-27 DIAGNOSIS — N186 End stage renal disease: Secondary | ICD-10-CM | POA: Diagnosis not present

## 2020-10-27 DIAGNOSIS — J810 Acute pulmonary edema: Secondary | ICD-10-CM | POA: Diagnosis not present

## 2020-10-27 DIAGNOSIS — J449 Chronic obstructive pulmonary disease, unspecified: Secondary | ICD-10-CM | POA: Diagnosis not present

## 2020-10-27 DIAGNOSIS — J9601 Acute respiratory failure with hypoxia: Secondary | ICD-10-CM | POA: Diagnosis not present

## 2020-10-27 MED ORDER — PROSOURCE PLUS PO LIQD
30.0000 mL | Freq: Two times a day (BID) | ORAL | Status: DC
  Administered 2020-10-27 – 2020-11-05 (×15): 30 mL via ORAL
  Filled 2020-10-27 (×23): qty 30

## 2020-10-27 MED ORDER — RENA-VITE PO TABS
1.0000 | ORAL_TABLET | Freq: Every day | ORAL | Status: DC
  Administered 2020-10-27 – 2020-11-05 (×10): 1 via ORAL
  Filled 2020-10-27 (×10): qty 1

## 2020-10-27 NOTE — Progress Notes (Signed)
PROGRESS NOTE    Cassandra Dyer  FVC:[id number] DOB: [birth year] DOA: 10/21/2020 PCP: Biagio Borg, MD    Brief Narrative:  Cassandra Dyer is a 79 year old female with past medical history significant for ESRD on HD, history of renal cell carcinoma right kidney status post nephrectomy, essential hypertension, anemia of chronic kidney disease who presented to the ED with complaint of shortness of breath, weakness.  Missed hemodialysis on Monday.  Baseline oxygen requirement of 2 L but now up to 4 L to maintain adequate oxygenation.  In the ED, chest x-ray notable for bilateral pleural effusions and interstitial edema.  EDP consulted hospitalist group for admission secondary to acute on chronic hypoxic respiratory failure secondary to volume overload/pulmonary edema/pleural effusion.   Assessment & Plan:   Principal Problem:   Pulmonary edema Active Problems:   Essential hypertension   Renal cell carcinoma of right kidney (HCC)   ESRD (end stage renal disease) (HCC)   COPD (chronic obstructive pulmonary disease) (HCC)   Acute pulmonary edema (HCC)   Acute respiratory failure with hypoxia (HCC)   Acute on chronic hypoxic respiratory failure 2/2 pulmonary edema and bilateral pleural effusions COPD Patient presenting from home with progressive shortness of breath, missed HD session.  Chest x-ray on admission notable for bilateral pleural effusions, interstitial edema from volume overload.  Baseline oxygen 2 L per nasal cannula, Continues on 4 L per nasal cannula.  Underwent ultrasound-guided left thoracentesis with 1 L fluid removed on 11/11 and right thoracentesis on 11/12 with 700 mL removed.  Lights criteria consistent with transudative effusion. --Fluid restriction 1.2 L daily --net negative 6.3L since admission --Continue supplemental oxygen, titrate to maintain SPO2 greater than 88%; currently on 2L [place] at rest but during ambulation yesterday required 4 L to maintain SPO2 at 88% with  significant shortness of breath --Repeat walk test today on 2 L nasal cannula ---Chest x-ray in a.m. --Continue further volume management per nephrology with HD, next scheduled 11/15  Essential hypertension --Carvedilol 12.5 mg p.o. twice daily, irbesartan 300 mg p.o. daily, amlodipine 10 mg p.o. daily  HLD: Continue Crestor 10 mg p.o. daily  Anemia of chronic kidney disease Hemoglobin 10.9, stable.   DVT prophylaxis: SCDs Code Status: Full code Family Communication: No family present at bedside this morning.  Disposition Plan:  Status is: Inpatient  Remains inpatient appropriate because:Unsafe d/c plan and Inpatient level of care appropriate due to severity of illness   Dispo: The patient is from: Home              Anticipated d/c is to: Home              Anticipated d/c date is: 1 day              Patient currently is not medically stable to d/c.   Consultants:   Nephrology  Interventional radiology  Procedures:  Thoracentesis, left 10/24/2020; 1L removed Thoracentesis, right 10/25/2020; 700 mL removed  Antimicrobials:   None   Subjective: Patient seen and examined at bedside, eating breakfast.  Shortness of breath improved at rest.  Reports significant dyspnea while walking with a drop in oxygen saturation to 82% requiring 4 L nasal cannula which is above her baseline of 2 L at home.  No other questions or concerns at this time.  No family present this morning. Denies headache, no dizziness, no chest pain, no palpitations, no abdominal pain, no nausea/vomiting/diarrhea.  No acute events overnight per nursing staff.  Objective: Vitals:  10/26/20 1802 10/27/20 0008 10/27/20 0500 10/27/20 1251  BP: (!) 127/54 110/84 (!) 131/58 (!) 116/50  Pulse: 86 84 87 78  Resp: 16 18 18 16   Temp: 98.9 F (37.2 C) 98.4 F (36.9 C) 98.6 F (37 C) 98.5 F (36.9 C)  TempSrc: Oral Oral Oral Oral  SpO2: 96% 94% 98% 98%  Weight:   57.2 kg   Height:        Intake/Output  Summary (Last 24 hours) at 10/27/2020 1315 Last data filed at 10/27/2020 0500 Gross per 24 hour  Intake 240 ml  Output --  Net 240 ml   Filed Weights   10/25/20 1135 10/26/20 0500 10/27/20 0500  Weight: 56.9 kg 57.2 kg 57.2 kg    Examination:  General exam: Appears calm and comfortable  Respiratory system: Breath sounds slightly decreased bilateral bases, otherwise clear to auscultation. Respiratory effort normal.  On 2 L nasal cannula with SPO2 99% at rest but SPO2 dropped to 82% on ambulation and required 4 L to maintain 88% (baseline 2LNC) Cardiovascular system: S1 & S2 heard, RRR. No JVD, murmurs, rubs, gallops or clicks. No pedal edema.  Other noted Gastrointestinal system: Abdomen is nondistended, soft and nontender. No organomegaly or masses felt. Normal bowel sounds heard. Central nervous system: Alert and oriented. No focal neurological deficits. Extremities: Symmetric 5 x 5 power. Skin: No rashes, lesions or ulcers Psychiatry: Judgement and insight appear normal. Mood & affect appropriate.     Data Reviewed: I have personally reviewed following labs and imaging studies  CBC: Recent Labs  Lab 10/21/20 1827 10/22/20 0130 10/23/20 0417 10/25/20 0707 10/25/20 0817  WBC 15.4* 16.1* 15.7* 16.3* 16.3*  NEUTROABS  --   --  12.9*  --   --   HGB 9.2* 9.5* 8.3* 8.1* 8.0*  HCT 31.5* 30.1* 26.5* 25.1* 24.8*  MCV 99.7 98.4 98.5 95.4 99.2  PLT 303 298 237 260 009   Basic Metabolic Panel: Recent Labs  Lab 10/21/20 1827 10/22/20 0130 10/23/20 0808 10/25/20 0203 10/25/20 0707  NA 133* 136 136 131* 131*  K 4.3 4.4 3.2* 3.8 4.1  CL 95* 98 99 97* 96*  CO2 24 26 28 26 24   GLUCOSE 165* 98 93 99 80  BUN 28* 31* 14 28* 31*  CREATININE 6.42* 6.76* 4.08* 6.73* 7.11*  CALCIUM 9.2 9.3 8.6* 8.6* 8.6*  MG  --   --   --  1.9  --   PHOS  --   --  2.2*  --  3.1   GFR: Estimated Creatinine Clearance: 5.2 mL/min (A) (by C-G formula based on SCr of 7.11 mg/dL (H)). Liver Function  Tests: Recent Labs  Lab 10/22/20 0130 10/23/20 0808 10/25/20 0707  AST 32  --   --   ALT 21  --   --   ALKPHOS 69  --   --   BILITOT 1.1  --   --   PROT 6.0*  --   --   ALBUMIN 2.3* 1.8* 1.6*   No results for input(s): LIPASE, AMYLASE in the last 168 hours. No results for input(s): AMMONIA in the last 168 hours. Coagulation Profile: No results for input(s): INR, PROTIME in the last 168 hours. Cardiac Enzymes: No results for input(s): CKTOTAL, CKMB, CKMBINDEX, TROPONINI in the last 168 hours. BNP (last 3 results) No results for input(s): PROBNP in the last 8760 hours. HbA1C: No results for input(s): HGBA1C in the last 72 hours. CBG: Recent Labs  Lab 10/21/20 2010  GLUCAP  109*   Lipid Profile: No results for input(s): CHOL, HDL, LDLCALC, TRIG, CHOLHDL, LDLDIRECT in the last 72 hours. Thyroid Function Tests: No results for input(s): TSH, T4TOTAL, FREET4, T3FREE, THYROIDAB in the last 72 hours. Anemia Panel: No results for input(s): VITAMINB12, FOLATE, FERRITIN, TIBC, IRON, RETICCTPCT in the last 72 hours. Sepsis Labs: No results for input(s): PROCALCITON, LATICACIDVEN in the last 168 hours.  Recent Results (from the past 240 hour(s))  Respiratory Panel by RT PCR (Flu A&B, Covid) - Nasopharyngeal Swab     Status: None   Collection Time: 10/21/20  6:04 PM   Specimen: Nasopharyngeal Swab  Result Value Ref Range Status   SARS Coronavirus 2 by RT PCR NEGATIVE NEGATIVE Final    Comment: (NOTE) SARS-CoV-2 target nucleic acids are NOT DETECTED.  The SARS-CoV-2 RNA is generally detectable in upper respiratoy specimens during the acute phase of infection. The lowest concentration of SARS-CoV-2 viral copies this assay can detect is 131 copies/mL. A negative result does not preclude SARS-Cov-2 infection and should not be used as the sole basis for treatment or other patient management decisions. A negative result may occur with  improper specimen collection/handling, submission  of specimen other than nasopharyngeal swab, presence of viral mutation(s) within the areas targeted by this assay, and inadequate number of viral copies (<131 copies/mL). A negative result must be combined with clinical observations, patient history, and epidemiological information. The expected result is Negative.  Fact Sheet for Patients:  PinkCheek.be  Fact Sheet for Healthcare Providers:  GravelBags.it  This test is no t yet approved or cleared by the [place] FDA and  has been authorized for detection and/or diagnosis of SARS-CoV-2 by FDA under an Emergency Use Authorization (EUA). This EUA will remain  in effect (meaning this test can be used) for the duration of the COVID-19 declaration under Section 564(b)(1) of the Act, 21 U.S.C. section 360bbb-3(b)(1), unless the authorization is terminated or revoked sooner.     Influenza A by PCR NEGATIVE NEGATIVE Final   Influenza B by PCR NEGATIVE NEGATIVE Final    Comment: (NOTE) The Xpert Xpress SARS-CoV-2/FLU/RSV assay is intended as an aid in  the diagnosis of influenza from Nasopharyngeal swab specimens and  should not be used as a sole basis for treatment. Nasal washings and  aspirates are unacceptable for Xpert Xpress SARS-CoV-2/FLU/RSV  testing.  Fact Sheet for Patients: PinkCheek.be  Fact Sheet for Healthcare Providers: GravelBags.it  This test is not yet approved or cleared by the [place] FDA and  has been authorized for detection and/or diagnosis of SARS-CoV-2 by  FDA under an Emergency Use Authorization (EUA). This EUA will remain  in effect (meaning this test can be used) for the duration of the  Covid-19 declaration under Section 564(b)(1) of the Act, 21  U.S.C. section 360bbb-3(b)(1), unless the authorization is  terminated or revoked. Performed at [facility], [place] [address], [place], [place] [address]   Gram stain     Status: None   Collection Time: 10/24/20 11:20 AM   Specimen: Lung, Left; Pleural Fluid  Result Value Ref Range Status   Specimen Description LUNG  Final   Special Requests NONE  Final   Gram Stain   Final    WBC PRESENT,BOTH PMN AND MONONUCLEAR NO ORGANISMS SEEN CYTOSPIN SMEAR Performed at [facility], [address] [place], [place] [address]    Report Status 10/24/2020 FINAL  Final  Culture, body fluid-bottle     Status: None (  Preliminary result)   Collection Time: 10/24/20 11:21 AM   Specimen: Fluid  Result Value Ref Range Status   Specimen Description FLUID  Final   Special Requests NONE  Final   Culture   Final    NO GROWTH 3 DAYS Performed at [facility], [address] [place], [place] [address]    Report Status PENDING  Incomplete         Radiology Studies: DG Chest 1 View  Result Date: 10/25/2020 CLINICAL DATA:  79 year old female with a history of thoracentesis EXAM: CHEST  1 VIEW COMPARISON:  10/24/2020, 10/23/2020 FINDINGS: Cardiomediastinal silhouette unchanged in size and contour with the heart borders partially obscured by overlying lung and pleural disease. Decreasing interlobular septal thickening. Unchanged right IJ tunneled hemodialysis catheter. Improved opacity at the right lung base with minimal blunting at the right costophrenic angle. No pneumothorax. Opacity at the left lung base is increased from the most recent comparison, though not as severe as the study dated 10/23/2020. No pneumothorax on the left. IMPRESSION: Decreased right-sided pleural effusion with no pneumothorax status post right thoracentesis. Recurrent left-sided pleural effusion and associated atelectasis/consolidation. Unchanged right IJ tunneled hemodialysis catheter. Improving pulmonary edema. Electronically Signed   By: Corrie Mckusick D.O.   On: 10/25/2020 16:40   IR THORACENTESIS ASP PLEURAL SPACE W/IMG GUIDE  Result Date:  10/25/2020 INDICATION: Shortness of breath. Right-sided pleural effusion. Request for therapeutic thoracentesis. EXAM: ULTRASOUND GUIDED RIGHT THORACENTESIS MEDICATIONS: 1% plain lidocaine, 5 mL COMPLICATIONS: None immediate. PROCEDURE: An ultrasound guided thoracentesis was thoroughly discussed with the patient and questions answered. The benefits, risks, alternatives and complications were also discussed. The patient understands and wishes to proceed with the procedure. Written consent was obtained. Ultrasound was performed to localize and mark an adequate pocket of fluid in the right chest. The area was then prepped and draped in the normal sterile fashion. 1% Lidocaine was used for local anesthesia. Under ultrasound guidance a 6 Fr Safe-T-Centesis catheter was introduced. Thoracentesis was performed. The catheter was removed and a dressing applied. FINDINGS: A total of approximately 700 mL of clear yellow fluid was removed. IMPRESSION: Successful ultrasound guided right thoracentesis yielding 700 mL of pleural fluid. Read by: Ascencion Dike PA-C Electronically Signed   By: Aletta Edouard M.D.   On: 10/25/2020 16:35        Scheduled Meds:  (feeding supplement) PROSource Plus  30 mL Oral BID BM   amLODipine  10 mg Oral Daily   carvedilol  12.5 mg Oral BID WC   Chlorhexidine Gluconate Cloth  6 each Topical Q0600   darbepoetin (ARANESP) injection - DIALYSIS  100 mcg Intravenous Q Fri-HD   heparin  5,000 Units Subcutaneous Q8H   irbesartan  300 mg Oral Daily   multivitamin  1 tablet Oral QHS   rosuvastatin  10 mg Oral Daily   Continuous Infusions:   LOS: 4 days    Time spent: 32 minutes spent on chart review, discussion with nursing staff, consultants, updating family and interview/physical exam; more than 50% of that time was spent in counseling and/or coordination of care.    Lucilia Yanni J [place] DO [facility] Available via Epic secure chat 7am-7pm After these hours,  please refer to coverage provider listed on [email or web address] 10/27/2020, 1:15 PM

## 2020-10-27 NOTE — Progress Notes (Addendum)
[facility] Progress Note   Subjective:   Patient seen and examined at bedside.  No new complaints. Denies SOB, CP, n/v/d, abdominal pain and fatigue.    Objective Vitals:   10/26/20 1253 10/26/20 1802 10/27/20 0008 10/27/20 0500  BP: 124/67 (!) 127/54 110/84 (!) 131/58  Pulse: 84 86 84 87  Resp: 16 16 18 18   Temp: 99.6 F (37.6 C) 98.9 F (37.2 C) 98.4 F (36.9 C) 98.6 F (37 C)  TempSrc: Oral Oral Oral Oral  SpO2: 97% 96% 94% 98%  Weight:    57.2 kg  Height:       Physical Exam General:frail, elderly female in NAD Heart:RRR Lungs:BS diminished, nml WOB on 2L via [place] Abdomen:soft, NTND Extremities:trace LE edema L>R Dialysis Access: R IJ TDC, LU AVF +b (small in size with collaterals)   Filed Weights   10/25/20 1135 10/26/20 0500 10/27/20 0500  Weight: 56.9 kg 57.2 kg 57.2 kg    Intake/Output Summary (Last 24 hours) at 10/27/2020 1150 Last data filed at 10/27/2020 0500 Gross per 24 hour  Intake 240 ml  Output --  Net 240 ml    Additional Objective Labs: Basic Metabolic Panel: Recent Labs  Lab 10/23/20 0808 10/25/20 0203 10/25/20 0707  NA 136 131* 131*  K 3.2* 3.8 4.1  CL 99 97* 96*  CO2 28 26 24   GLUCOSE 93 99 80  BUN 14 28* 31*  CREATININE 4.08* 6.73* 7.11*  CALCIUM 8.6* 8.6* 8.6*  PHOS 2.2*  --  3.1   Liver Function Tests: Recent Labs  Lab 10/22/20 0130 10/23/20 0808 10/25/20 0707  AST 32  --   --   ALT 21  --   --   ALKPHOS 69  --   --   BILITOT 1.1  --   --   PROT 6.0*  --   --   ALBUMIN 2.3* 1.8* 1.6*   CBC: Recent Labs  Lab 10/21/20 1827 10/21/20 1827 10/22/20 0130 10/22/20 0130 10/23/20 0417 10/25/20 0707 10/25/20 0817  WBC 15.4*   < > 16.1*   < > 15.7* 16.3* 16.3*  NEUTROABS  --   --   --   --  12.9*  --   --   HGB 9.2*   < > 9.5*   < > 8.3* 8.1* 8.0*  HCT 31.5*   < > 30.1*   < > 26.5* 25.1* 24.8*  MCV 99.7  --  98.4  --  98.5 95.4 99.2  PLT 303   < > 298   < > 237 260 262   < > = values in this interval  not displayed.   Blood Culture    Component Value Date/Time   SDES FLUID 10/24/2020 1121   SPECREQUEST NONE 10/24/2020 1121   CULT  10/24/2020 1121    NO GROWTH 3 DAYS Performed at [facility], [place] [address], [place], [place] [address]    REPTSTATUS PENDING 10/24/2020 1121    CBG: Recent Labs  Lab 10/21/20 2010  GLUCAP 109*   Studies/Results: DG Chest 1 View  Result Date: 10/25/2020 CLINICAL DATA:  79 year old female with a history of thoracentesis EXAM: CHEST  1 VIEW COMPARISON:  10/24/2020, 10/23/2020 FINDINGS: Cardiomediastinal silhouette unchanged in size and contour with the heart borders partially obscured by overlying lung and pleural disease. Decreasing interlobular septal thickening. Unchanged right IJ tunneled hemodialysis catheter. Improved opacity at the right lung base with minimal blunting at the right costophrenic angle. No pneumothorax. Opacity at  the left lung base is increased from the most recent comparison, though not as severe as the study dated 10/23/2020. No pneumothorax on the left. IMPRESSION: Decreased right-sided pleural effusion with no pneumothorax status post right thoracentesis. Recurrent left-sided pleural effusion and associated atelectasis/consolidation. Unchanged right IJ tunneled hemodialysis catheter. Improving pulmonary edema. Electronically Signed   By: Corrie Mckusick D.O.   On: 10/25/2020 16:40   IR THORACENTESIS ASP PLEURAL SPACE W/IMG GUIDE  Result Date: 10/25/2020 INDICATION: Shortness of breath. Right-sided pleural effusion. Request for therapeutic thoracentesis. EXAM: ULTRASOUND GUIDED RIGHT THORACENTESIS MEDICATIONS: 1% plain lidocaine, 5 mL COMPLICATIONS: None immediate. PROCEDURE: An ultrasound guided thoracentesis was thoroughly discussed with the patient and questions answered. The benefits, risks, alternatives and complications were also discussed. The patient understands and wishes to proceed with the procedure. Written consent  was obtained. Ultrasound was performed to localize and mark an adequate pocket of fluid in the right chest. The area was then prepped and draped in the normal sterile fashion. 1% Lidocaine was used for local anesthesia. Under ultrasound guidance a 6 Fr Safe-T-Centesis catheter was introduced. Thoracentesis was performed. The catheter was removed and a dressing applied. FINDINGS: A total of approximately 700 mL of clear yellow fluid was removed. IMPRESSION: Successful ultrasound guided right thoracentesis yielding 700 mL of pleural fluid. Read by: Ascencion Dike PA-C Electronically Signed   By: Aletta Edouard M.D.   On: 10/25/2020 16:35    Medications:  . amLODipine  10 mg Oral Daily  . carvedilol  12.5 mg Oral BID WC  . Chlorhexidine Gluconate Cloth  6 each Topical Q0600  . darbepoetin (ARANESP) injection - DIALYSIS  100 mcg Intravenous Q Fri-HD  . heparin  5,000 Units Subcutaneous Q8H  . irbesartan  300 mg Oral Daily  . rosuvastatin  10 mg Oral Daily    [facility] getting there. HD Bath2/2.5, Dialyzer180, Heparinno. AccessTDC- has left AVF that can be used after 11/25. mircera100 q 2 weeks, (last 10/27) venofer 100 q tx right now 11/3 hgb 8.3 11/1 k 4  Background:  79 year old female with COPD and ESRD presenting with weakness and increased O2 requirement.   Assessment/Plan: 1Hypoxia- has a baseline O2 req2L which she is currently managing with at rest but increased requirement with ambulation.Working on UF with HD and also had 2 thoracentesis.  Volume status improving. CXR 11/12 showed improved pulmonary edema and R pleural effusion. Down to ~56.9kg post HD 11/12, continue to titrate down as tolerated.  Will need lower EDW on d/c.  2 ESRD:normally MWF at [place] via [facility]- Has only been on HD since August. Next HD 11/15.  Continue standing weights post HD. Last K 4.1 3 Hypertension:hypertensive on admission and by CXR volume overloaded. BP  improved. Continue home meds and volume lowering with HD  4. Anemia of ESRD:on mircera and venofer at OP center- Hgb 9.5>8.3>8.0. Aranesp 124mcg givenwith HD 11/12 5. Metabolic Bone Disease:Phos ok.  Corrected Ca 10.5, use low Ca bath. not on vitamin D or sensipar at OP unit. No binder on OP med list 6. Nutrition: Renal diet w/fluid restrictions. Alb low 1.6 - protein supplements  Jen Mow, PA-C [facility] 10/27/2020,11:50 AM  LOS: 4 days

## 2020-10-28 ENCOUNTER — Inpatient Hospital Stay (HOSPITAL_COMMUNITY): Payer: Medicare Other

## 2020-10-28 DIAGNOSIS — J449 Chronic obstructive pulmonary disease, unspecified: Secondary | ICD-10-CM | POA: Diagnosis not present

## 2020-10-28 DIAGNOSIS — N186 End stage renal disease: Secondary | ICD-10-CM | POA: Diagnosis not present

## 2020-10-28 DIAGNOSIS — J810 Acute pulmonary edema: Secondary | ICD-10-CM | POA: Diagnosis not present

## 2020-10-28 DIAGNOSIS — J9601 Acute respiratory failure with hypoxia: Secondary | ICD-10-CM | POA: Diagnosis not present

## 2020-10-28 LAB — CBC
HCT: 26.9 % — ABNORMAL LOW (ref 36.0–46.0)
Hemoglobin: 8.5 g/dL — ABNORMAL LOW (ref 12.0–15.0)
MCH: 30.2 pg (ref 26.0–34.0)
MCHC: 31.6 g/dL (ref 30.0–36.0)
MCV: 95.7 fL (ref 80.0–100.0)
Platelets: 297 10*3/uL (ref 150–400)
RBC: 2.81 MIL/uL — ABNORMAL LOW (ref 3.87–5.11)
RDW: 17.4 % — ABNORMAL HIGH (ref 11.5–15.5)
WBC: 15.1 10*3/uL — ABNORMAL HIGH (ref 4.0–10.5)
nRBC: 0 % (ref 0.0–0.2)

## 2020-10-28 LAB — BASIC METABOLIC PANEL
Anion gap: 12 (ref 5–15)
BUN: 37 mg/dL — ABNORMAL HIGH (ref 8–23)
CO2: 23 mmol/L (ref 22–32)
Calcium: 8.8 mg/dL — ABNORMAL LOW (ref 8.9–10.3)
Chloride: 95 mmol/L — ABNORMAL LOW (ref 98–111)
Creatinine, Ser: 7.24 mg/dL — ABNORMAL HIGH (ref 0.44–1.00)
GFR, Estimated: 5 mL/min — ABNORMAL LOW (ref >60)
Glucose, Bld: 86 mg/dL (ref 70–99)
Potassium: 4.5 mmol/L (ref 3.5–5.1)
Sodium: 130 mmol/L — ABNORMAL LOW (ref 135–145)

## 2020-10-28 MED ORDER — HEPARIN SODIUM (PORCINE) 1000 UNIT/ML IJ SOLN
INTRAMUSCULAR | Status: AC
  Administered 2020-10-28: 3200 [IU] via INTRAVENOUS
  Filled 2020-10-28: qty 4

## 2020-10-28 MED ORDER — CARVEDILOL 6.25 MG PO TABS
6.2500 mg | ORAL_TABLET | Freq: Two times a day (BID) | ORAL | Status: DC
  Administered 2020-10-29: 6.25 mg via ORAL
  Filled 2020-10-28 (×3): qty 1

## 2020-10-28 MED ORDER — HEPARIN SODIUM (PORCINE) 1000 UNIT/ML IJ SOLN
1000.0000 [IU] | INTRAMUSCULAR | Status: DC
  Administered 2020-10-30: 1000 [IU] via INTRAVENOUS
  Filled 2020-10-28: qty 1

## 2020-10-28 MED ORDER — AMLODIPINE BESYLATE 5 MG PO TABS
5.0000 mg | ORAL_TABLET | Freq: Every day | ORAL | Status: DC
  Administered 2020-10-29: 5 mg via ORAL
  Filled 2020-10-28 (×3): qty 1

## 2020-10-28 NOTE — Progress Notes (Addendum)
[facility] Progress Note   Subjective:   Patient seen on HD, no new c/o. CXR today still looks wet  Objective Vitals:   10/28/20 1030 10/28/20 1100 10/28/20 1130 10/28/20 1139  BP: (!) 117/46 (!) 116/51 (!) 110/49 112/63  Pulse:      Resp: (!) 25 (!) 25 (!) 25 17  Temp:    (!) 97.5 F (36.4 C)  TempSrc:    Oral  SpO2:    98%  Weight:    55 kg  Height:       Physical Exam General:frail, elderly female in NAD Heart:RRR Lungs:BS diminished, nml WOB on 2L via [place] Abdomen:soft, NTND Extremities:trace LE edema L>R Dialysis Access: R IJ TDC, LU AVF +b (small in size with collaterals)   Filed Weights   10/27/20 0500 10/28/20 0720 10/28/20 1139  Weight: 57.2 kg 57.7 kg 55 kg    Intake/Output Summary (Last 24 hours) at 10/28/2020 1534 Last data filed at 10/28/2020 1139 Gross per 24 hour  Intake 220 ml  Output 2500 ml  Net -2280 ml    Additional Objective Labs: Basic Metabolic Panel: Recent Labs  Lab 10/23/20 0808 10/23/20 0808 10/25/20 0203 10/25/20 0707 10/28/20 0115  NA 136   < > 131* 131* 130*  K 3.2*   < > 3.8 4.1 4.5  CL 99   < > 97* 96* 95*  CO2 28   < > 26 24 23   GLUCOSE 93   < > 99 80 86  BUN 14   < > 28* 31* 37*  CREATININE 4.08*   < > 6.73* 7.11* 7.24*  CALCIUM 8.6*   < > 8.6* 8.6* 8.8*  PHOS 2.2*  --   --  3.1  --    < > = values in this interval not displayed.   Liver Function Tests: Recent Labs  Lab 10/22/20 0130 10/23/20 0808 10/25/20 0707  AST 32  --   --   ALT 21  --   --   ALKPHOS 69  --   --   BILITOT 1.1  --   --   PROT 6.0*  --   --   ALBUMIN 2.3* 1.8* 1.6*   CBC: Recent Labs  Lab 10/22/20 0130 10/22/20 0130 10/23/20 0417 10/23/20 0417 10/25/20 0707 10/25/20 0817 10/28/20 0115  WBC 16.1*   < > 15.7*   < > 16.3* 16.3* 15.1*  NEUTROABS  --   --  12.9*  --   --   --   --   HGB 9.5*   < > 8.3*   < > 8.1* 8.0* 8.5*  HCT 30.1*   < > 26.5*   < > 25.1* 24.8* 26.9*  MCV 98.4  --  98.5  --  95.4 99.2 95.7  PLT  298   < > 237   < > 260 262 297   < > = values in this interval not displayed.    Medications:  . (feeding supplement) PROSource Plus  30 mL Oral BID BM  . amLODipine  5 mg Oral Daily  . carvedilol  6.25 mg Oral BID WC  . Chlorhexidine Gluconate Cloth  6 each Topical Q0600  . darbepoetin (ARANESP) injection - DIALYSIS  100 mcg Intravenous Q Fri-HD  . heparin  5,000 Units Subcutaneous Q8H  . heparin sodium (porcine)  1,000 Units Intravenous Q M,W,F-HD  . multivitamin  1 tablet Oral QHS  . rosuvastatin  10 mg Oral Daily    [facility]  MWF   4h  62kg   2/2.5 bath  Hep none  TDC   L AVF maturing (use after 11/25) mircera100 q 2 weeks, (last 10/27) venofer 100 q tx right now 11/3 hgb 8.3 11/1 k 4  Background:  79 year old female with COPD and ESRD presenting with weakness and increased O2 requirement.   Assessment/Plan: 1Hypoxia- has a baseline O2 req2L which she is currently managing with at rest but increased requirement with ambulation.Working on UF with HD and also had 2 thoracentesis.  Volume status improving. CXR 11/12 showed improved pulmonary edema and R pleural effusion. Down to ~56.9kg post HD 11/12, continue to titrate down as tolerated.  Will need lower EDW on d/c. Still a bit SOB today.  2.5 L removed w/ HD today. Had long talk about limiting fluid intake. Wean off O2 as is possible.  2 ESRD:normally MWF at [place] via [facility]- Has only been on HD since August. Next HD 11/15.  Continue standing weights post HD. Last K 4.1 3 Hypertension:hypertensive on admission and by CXR volume overloaded. BP normal now but CXR today still looks wet. Have lowered her BP meds by 50% and dc'd losartan.  4. Anemia of ESRD:on mircera and venofer at OP center- Hgb 9.5>8.3>8.0. Aranesp 163mcg givenwith HD 11/12 5. Metabolic Bone Disease:Phos ok.  Corrected Ca 10.5, use low Ca bath. not on vitamin D or sensipar at OP unit. No binder on OP med list 6. Nutrition: Renal diet w/fluid  restrictions. Alb low 1.6 - protein supplements  Kelly Splinter, MD 10/28/2020, 3:35 PM

## 2020-10-28 NOTE — Care Management Important Message (Signed)
Important Message  Patient Details  Name: Cassandra Dyer MRN: [id number] Date of Birth: Jan 24, 1941   Medicare Important Message Given:  Yes     Barb Merino Dorianne Perret 10/28/2020, 3:19 PM

## 2020-10-28 NOTE — Progress Notes (Signed)
PROGRESS NOTE    Cassandra Dyer  FGH:[id number] DOB: 07-21-1941 DOA: 10/21/2020 PCP: Biagio Borg, MD    Brief Narrative:  Cassandra Dyer is a 79 year old female with past medical history significant for ESRD on HD, history of renal cell carcinoma right kidney status post nephrectomy, essential hypertension, anemia of chronic kidney disease who presented to the ED with complaint of shortness of breath, weakness.  Missed hemodialysis on Monday.  Baseline oxygen requirement of 2 L but now up to 4 L to maintain adequate oxygenation.  In the ED, chest x-ray notable for bilateral pleural effusions and interstitial edema.  EDP consulted hospitalist group for admission secondary to acute on chronic hypoxic respiratory failure secondary to volume overload/pulmonary edema/pleural effusion.   Assessment & Plan:   Principal Problem:   Pulmonary edema Active Problems:   Essential hypertension   Renal cell carcinoma of right kidney (HCC)   ESRD (end stage renal disease) (HCC)   COPD (chronic obstructive pulmonary disease) (HCC)   Acute pulmonary edema (HCC)   Acute respiratory failure with hypoxia (HCC)   Acute on chronic hypoxic respiratory failure 2/2 pulmonary edema and bilateral pleural effusions COPD Patient presenting from home with progressive shortness of breath, missed HD session.  Chest x-ray on admission notable for bilateral pleural effusions, interstitial edema from volume overload.  Baseline oxygen 2 L per nasal cannula, Continues on 4 L per nasal cannula.  Underwent ultrasound-guided left thoracentesis with 1 L fluid removed on 11/11 and right thoracentesis on 11/12 with 700 mL removed.  Lights criteria consistent with transudative effusion. --Repeat chest x-ray this morning shows progressive cardiogenic failure/volume overload with progression of pulmonary infiltrates/enlarging bilateral pleural effusions L>R --Fluid restriction 1.2 L daily --net negative 6.2L since  admission --Continue supplemental oxygen, titrate to maintain SPO2 greater than 88%; currently on 2L [place] at rest but has required increased oxygen with exertion/ambulation --Repeat walk test today on 2 L nasal cannula today --Continue further volume management per nephrology; undergoing HD this morning  Essential hypertension --Carvedilol 12.5 mg p.o. twice daily, irbesartan 300 mg p.o. daily, amlodipine 10 mg p.o. daily  HLD: Continue Crestor 10 mg p.o. daily  Anemia of chronic kidney disease Hemoglobin 8.5 this am.   DVT prophylaxis: SCDs Code Status: Full code Family Communication: No family present at bedside this morning.  Disposition Plan:  Status is: Inpatient  Remains inpatient appropriate because:Unsafe d/c plan and Inpatient level of care appropriate due to severity of illness   Dispo: The patient is from: Home              Anticipated d/c is to: Home              Anticipated d/c date is: 1 day              Patient currently is not medically stable to d/c.   Consultants:   Nephrology  Interventional radiology  Procedures:  Thoracentesis, left 10/24/2020; 1L removed Thoracentesis, right 10/25/2020; 700 mL removed  Antimicrobials:   None   Subjective: Patient seen and examined, currently undergoing HD.  Patient fatigue/tired this morning.  Continues on 2 L nasal cannula at rest, but continues to require higher amounts of submental oxygen with exertion.  Repeat chest x-ray with progressive pulmonary edema/pleural effusions left greater than right.  Hopefully will have increased ultrafiltrate today with HD.  Also his repeat ambulatory walk test on 2 L later today.  No other questions or concerns at this time.  No family present  this morning. Denies headache, no dizziness, no chest pain, no palpitations, no abdominal pain, no nausea/vomiting/diarrhea.  No acute events overnight per nursing staff.  Objective: Vitals:   10/28/20 1000 10/28/20 1030 10/28/20 1100  10/28/20 1130  BP: (!) 124/50 (!) 117/46 (!) 116/51 (!) 110/49  Pulse:      Resp: (!) 23 (!) 25 (!) 25 (!) 25  Temp:      TempSrc:      SpO2:      Weight:      Height:        Intake/Output Summary (Last 24 hours) at 10/28/2020 1133 Last data filed at 10/28/2020 0704 Gross per 24 hour  Intake 220 ml  Output --  Net 220 ml   Filed Weights   10/26/20 0500 10/27/20 0500 10/28/20 0720  Weight: 57.2 kg 57.2 kg 57.7 kg    Examination:  General exam: Appears calm and comfortable  Respiratory system: Breath sounds slightly decreased bilateral bases with crackles. Respiratory effort normal.  On 2 L nasal cannula with SPO2 99% at rest  Cardiovascular system: S1 & S2 heard, RRR. No JVD, murmurs, rubs, gallops or clicks. No pedal edema.  Other noted Gastrointestinal system: Abdomen is nondistended, soft and nontender. No organomegaly or masses felt. Normal bowel sounds heard. Central nervous system: Alert and oriented. No focal neurological deficits. Extremities: Symmetric 5 x 5 power. Skin: No rashes, lesions or ulcers Psychiatry: Judgement and insight appear normal. Mood & affect appropriate.     Data Reviewed: I have personally reviewed following labs and imaging studies  CBC: Recent Labs  Lab 10/22/20 0130 10/23/20 0417 10/25/20 0707 10/25/20 0817 10/28/20 0115  WBC 16.1* 15.7* 16.3* 16.3* 15.1*  NEUTROABS  --  12.9*  --   --   --   HGB 9.5* 8.3* 8.1* 8.0* 8.5*  HCT 30.1* 26.5* 25.1* 24.8* 26.9*  MCV 98.4 98.5 95.4 99.2 95.7  PLT 298 237 260 262 413   Basic Metabolic Panel: Recent Labs  Lab 10/22/20 0130 10/23/20 0808 10/25/20 0203 10/25/20 0707 10/28/20 0115  NA 136 136 131* 131* 130*  K 4.4 3.2* 3.8 4.1 4.5  CL 98 99 97* 96* 95*  CO2 26 28 26 24 23   GLUCOSE 98 93 99 80 86  BUN 31* 14 28* 31* 37*  CREATININE 6.76* 4.08* 6.73* 7.11* 7.24*  CALCIUM 9.3 8.6* 8.6* 8.6* 8.8*  MG  --   --  1.9  --   --   PHOS  --  2.2*  --  3.1  --    GFR: Estimated  Creatinine Clearance: 5.1 mL/min (A) (by C-G formula based on SCr of 7.24 mg/dL (H)). Liver Function Tests: Recent Labs  Lab 10/22/20 0130 10/23/20 0808 10/25/20 0707  AST 32  --   --   ALT 21  --   --   ALKPHOS 69  --   --   BILITOT 1.1  --   --   PROT 6.0*  --   --   ALBUMIN 2.3* 1.8* 1.6*   No results for input(s): LIPASE, AMYLASE in the last 168 hours. No results for input(s): AMMONIA in the last 168 hours. Coagulation Profile: No results for input(s): INR, PROTIME in the last 168 hours. Cardiac Enzymes: No results for input(s): CKTOTAL, CKMB, CKMBINDEX, TROPONINI in the last 168 hours. BNP (last 3 results) No results for input(s): PROBNP in the last 8760 hours. HbA1C: No results for input(s): HGBA1C in the last 72 hours. CBG: Recent Labs  Lab  10/21/20 2010  GLUCAP 109*   Lipid Profile: No results for input(s): CHOL, HDL, LDLCALC, TRIG, CHOLHDL, LDLDIRECT in the last 72 hours. Thyroid Function Tests: No results for input(s): TSH, T4TOTAL, FREET4, T3FREE, THYROIDAB in the last 72 hours. Anemia Panel: No results for input(s): VITAMINB12, FOLATE, FERRITIN, TIBC, IRON, RETICCTPCT in the last 72 hours. Sepsis Labs: No results for input(s): PROCALCITON, LATICACIDVEN in the last 168 hours.  Recent Results (from the past 240 hour(s))  Respiratory Panel by RT PCR (Flu A&B, Covid) - Nasopharyngeal Swab     Status: None   Collection Time: 10/21/20  6:04 PM   Specimen: Nasopharyngeal Swab  Result Value Ref Range Status   SARS Coronavirus 2 by RT PCR NEGATIVE NEGATIVE Final    Comment: (NOTE) SARS-CoV-2 target nucleic acids are NOT DETECTED.  The SARS-CoV-2 RNA is generally detectable in upper respiratoy specimens during the acute phase of infection. The lowest concentration of SARS-CoV-2 viral copies this assay can detect is 131 copies/mL. A negative result does not preclude SARS-Cov-2 infection and should not be used as the sole basis for treatment or other patient  management decisions. A negative result may occur with  improper specimen collection/handling, submission of specimen other than nasopharyngeal swab, presence of viral mutation(s) within the areas targeted by this assay, and inadequate number of viral copies (<131 copies/mL). A negative result must be combined with clinical observations, patient history, and epidemiological information. The expected result is Negative.  Fact Sheet for Patients:  PinkCheek.be  Fact Sheet for Healthcare Providers:  GravelBags.it  This test is no t yet approved or cleared by the [place] FDA and  has been authorized for detection and/or diagnosis of SARS-CoV-2 by FDA under an Emergency Use Authorization (EUA). This EUA will remain  in effect (meaning this test can be used) for the duration of the COVID-19 declaration under Section 564(b)(1) of the Act, 21 U.S.C. section 360bbb-3(b)(1), unless the authorization is terminated or revoked sooner.     Influenza A by PCR NEGATIVE NEGATIVE Final   Influenza B by PCR NEGATIVE NEGATIVE Final    Comment: (NOTE) The Xpert Xpress SARS-CoV-2/FLU/RSV assay is intended as an aid in  the diagnosis of influenza from Nasopharyngeal swab specimens and  should not be used as a sole basis for treatment. Nasal washings and  aspirates are unacceptable for Xpert Xpress SARS-CoV-2/FLU/RSV  testing.  Fact Sheet for Patients: PinkCheek.be  Fact Sheet for Healthcare Providers: GravelBags.it  This test is not yet approved or cleared by the [place] FDA and  has been authorized for detection and/or diagnosis of SARS-CoV-2 by  FDA under an Emergency Use Authorization (EUA). This EUA will remain  in effect (meaning this test can be used) for the duration of the  Covid-19 declaration under Section 564(b)(1) of the Act, 21  U.S.C. section 360bbb-3(b)(1),  unless the authorization is  terminated or revoked. Performed at [facility], [place] [address], [place], [place] [address]   Gram stain     Status: None   Collection Time: 10/24/20 11:20 AM   Specimen: Lung, Left; Pleural Fluid  Result Value Ref Range Status   Specimen Description LUNG  Final   Special Requests NONE  Final   Gram Stain   Final    WBC PRESENT,BOTH PMN AND MONONUCLEAR NO ORGANISMS SEEN CYTOSPIN SMEAR Performed at [facility], [address] [place], [place] [address]    Report Status 10/24/2020 FINAL  Final  Culture, body fluid-bottle  Status: None (Preliminary result)   Collection Time: 10/24/20 11:21 AM   Specimen: Fluid  Result Value Ref Range Status   Specimen Description FLUID  Final   Special Requests NONE  Final   Culture   Final    NO GROWTH 3 DAYS Performed at [facility], [address], [place], [place] [address]    Report Status PENDING  Incomplete         Radiology Studies: DG CHEST PORT 1 VIEW  Result Date: 10/28/2020 CLINICAL DATA:  Dyspnea, end-stage renal disease EXAM: PORTABLE CHEST 1 VIEW COMPARISON:  10/25/2020 FINDINGS: Right internal jugular hemodialysis catheter is seen with its tip at the superior right atrium. Pulmonary insufflation is normal. Large left and small right pleural effusions are present and have enlarged in size since prior examination. Superimposed mild interstitial pulmonary edema has progressed in the interval since prior examination. No pneumothorax. Cardiac size is at the upper limits of normal. No acute bone abnormality. IMPRESSION: Progressive cardiogenic failure or volume overload with progressive interstitial pulmonary infiltrate and enlarging bilateral pleural effusions, left greater than right. Electronically Signed   By: Fidela Salisbury MD   On: 10/28/2020 06:42        Scheduled Meds: . (feeding supplement) PROSource Plus  30 mL Oral BID BM  . amLODipine  5 mg Oral Daily  .  carvedilol  6.25 mg Oral BID WC  . Chlorhexidine Gluconate Cloth  6 each Topical Q0600  . darbepoetin (ARANESP) injection - DIALYSIS  100 mcg Intravenous Q Fri-HD  . heparin  5,000 Units Subcutaneous Q8H  . heparin sodium (porcine)  1,000 Units Intravenous Q M,W,F-HD  . multivitamin  1 tablet Oral QHS  . rosuvastatin  10 mg Oral Daily   Continuous Infusions:   LOS: 5 days    Time spent: 34 minutes spent on chart review, discussion with nursing staff, consultants, updating family and interview/physical exam; more than 50% of that time was spent in counseling and/or coordination of care.    Nazar Kuan J [place] DO [facility] Available via Epic secure chat 7am-7pm After these hours, please refer to coverage provider listed on [email or web address] 10/28/2020, 11:33 AM

## 2020-10-28 NOTE — Progress Notes (Signed)
Physical Therapy Treatment Patient Details Name: Cassandra Dyer MRN: [id number] DOB: 27-Mar-1941 Today's Date: 10/28/2020    History of Present Illness Pt is a 79 y/o female admitted secondary to worsening SOB. Found to have pulmonary edema. PMH includes HTN, R kidney carcinoma s/p nephrectomy, COPD on 2L, and ESRD on HD.     PT Comments    Pt just back from dialysis and agreeable to some therapy; during therapy lunch came and pt requested to terminate session to eat; pt demonstrating improved balance during sit<>stand from EOB wtihout use of AD: pt fatigued quickly; pt requiring cueing for breathing to improve O2; pt continues to demonstrate deficits in balance, strength, coordination, gait and endurance and will benefit from skilled PT to address deficits to maximize independence with functional mobility prior to discharge.     Follow Up Recommendations  Home health PT;Supervision for mobility/OOB     Equipment Recommendations  Other (comment) (rollator)    Recommendations for Other Services       Precautions / Restrictions Precautions Precautions: Fall;Other (comment) Precaution Comments: watch O2 sats Restrictions Weight Bearing Restrictions: No    Mobility  Bed Mobility Overal bed mobility: Needs Assistance Bed Mobility: Sit to Supine;Supine to Sit     Supine to sit: Supervision;HOB elevated Sit to supine: Supervision;HOB elevated      Transfers Overall transfer level: Needs assistance Equipment used: None Transfers: Sit to/from Stand Sit to Stand: Supervision         General transfer comment: performed 5 trials without AD  Ambulation/Gait             General Gait Details: pt refused ambulation   Stairs             Wheelchair Mobility    Modified Rankin (Stroke Patients Only)       Balance                                            Cognition                                               Exercises General Exercises - Lower Extremity Ankle Circles/Pumps: AROM;Both;10 reps;Supine Quad Sets: AROM;Both;10 reps;Supine Gluteal Sets: AROM;Both;10 reps;Supine Heel Slides: AROM;Both;10 reps;Supine Hip ABduction/ADduction: AROM;Both;10 reps;Supine Straight Leg Raises: AROM;Both;10 reps;Supine    General Comments        Pertinent Vitals/Pain      Home Living                      Prior Function            PT Goals (current goals can now be found in the care plan section) Acute Rehab PT Goals Patient Stated Goal: to feel better PT Goal Formulation: With patient Time For Goal Achievement: 11/05/20 Potential to Achieve Goals: Fair Progress towards PT goals: Progressing toward goals    Frequency    Min 3X/week      PT Plan Current plan remains appropriate    Co-evaluation              AM-PAC PT "6 Clicks" Mobility   Outcome Measure  Help needed turning from your back to your side while in a flat bed without using bedrails?: None  Help needed moving from lying on your back to sitting on the side of a flat bed without using bedrails?: None Help needed moving to and from a bed to a chair (including a wheelchair)?: A Little Help needed standing up from a chair using your arms (e.g., wheelchair or bedside chair)?: None Help needed to walk in hospital room?: A Little Help needed climbing 3-5 steps with a railing? : A Little 6 Click Score: 21    End of Session Equipment Utilized During Treatment: Gait belt Activity Tolerance: Patient tolerated treatment well Patient left: with call bell/phone within reach;in bed;with bed alarm set Nurse Communication: Mobility status PT Visit Diagnosis: Other abnormalities of gait and mobility (R26.89);Difficulty in walking, not elsewhere classified (R26.2)     Time: 4270-6237 PT Time Calculation (min) (ACUTE ONLY): 16 min  Charges:  $Therapeutic Exercise: 8-22 mins                     Lyanne Co, DPT Acute  Rehabilitation Services [phone]   [name] 10/28/2020, 1:32 PM

## 2020-10-28 NOTE — Progress Notes (Signed)
OT Cancellation Note  Patient Details Name: Cassandra Dyer MRN: [id number] DOB: 12-Jun-1941   Cancelled Treatment:    Reason Eval/Treat Not Completed: Patient at procedure or test/ unavailable- off unit at HD. Will follow and see as able.   Jolaine Artist, OT Acute Rehabilitation Services Pager [phone] Office [phone]   Cassandra Dyer 10/28/2020, 10:50 AM

## 2020-10-28 NOTE — Progress Notes (Signed)
Occupational Therapy Treatment Patient Details Name: Cassandra Dyer MRN: [id number] DOB: 01-Sep-1941 Today's Date: 10/28/2020    History of present illness Pt is a 79 y/o female admitted secondary to worsening SOB. Found to have pulmonary edema. PMH includes HTN, R kidney carcinoma s/p nephrectomy, COPD on 2L, and ESRD on HD.    OT comments  Patient progressing well.  On 2L supplemental O2 via [place] (down from 4L last session), with SpO2 maintained > 93% throughout session.  Completing transfers and mobility in room without AD, given supervision. Grooming at sink with supervision.  All needs met upon exit.  Continue to recommend HHOT services at dc.    Follow Up Recommendations  Home health OT;Supervision/Assistance - 24 hour    Equipment Recommendations  3 in 1 bedside commode    Recommendations for Other Services      Precautions / Restrictions Precautions Precautions: Fall;Other (comment) Precaution Comments: watch O2 sats Restrictions Weight Bearing Restrictions: No       Mobility Bed Mobility Overal bed mobility: Modified Independent Bed Mobility: Sit to Supine;Supine to Sit     Supine to sit: Supervision;HOB elevated Sit to supine: Supervision;HOB elevated   General bed mobility comments: no assist required  Transfers Overall transfer level: Needs assistance Equipment used: None Transfers: Sit to/from Stand Sit to Stand: Supervision         General transfer comment: no assist required, supervision for safety     Balance Overall balance assessment: Needs assistance Sitting-balance support: No upper extremity supported;Feet supported Sitting balance-Leahy Scale: Good     Standing balance support: No upper extremity supported;During functional activity Standing balance-Leahy Scale: Fair                             ADL either performed or assessed with clinical judgement   ADL Overall ADL's : Needs assistance/impaired     Grooming:  Supervision/safety;Standing;Oral care                   Toilet Transfer: Supervision/safety;Ambulation           Functional mobility during ADLs: Supervision/safety General ADL Comments: pt progressing well, on 2L [place] and Spo2 maintained >93% during session.      Vision       Perception     Praxis      Cognition Arousal/Alertness: Awake/alert Behavior During Therapy: WFL for tasks assessed/performed Overall Cognitive Status: Within Functional Limits for tasks assessed                                          Exercises General Exercises - Lower Extremity Ankle Circles/Pumps: AROM;Both;10 reps;Supine Quad Sets: AROM;Both;10 reps;Supine Gluteal Sets: AROM;Both;10 reps;Supine Heel Slides: AROM;Both;10 reps;Supine Hip ABduction/ADduction: AROM;Both;10 reps;Supine Straight Leg Raises: AROM;Both;10 reps;Supine   Shoulder Instructions       General Comments on 2L [place], SpO2 >93% during activity     Pertinent Vitals/ Pain       Pain Assessment: No/denies pain  Home Living                                          Prior Functioning/Environment              Frequency  Min 2X/week  Progress Toward Goals  OT Goals(current goals can now be found in the care plan section)  Progress towards OT goals: Progressing toward goals  Acute Rehab OT Goals Patient Stated Goal: home OT Goal Formulation: With patient  Plan Discharge plan remains appropriate;Frequency remains appropriate    Co-evaluation                 AM-PAC OT "6 Clicks" Daily Activity     Outcome Measure   Help from another person eating meals?: None Help from another person taking care of personal grooming?: A Little Help from another person toileting, which includes using toliet, bedpan, or urinal?: A Little Help from another person bathing (including washing, rinsing, drying)?: A Little Help from another person to put on and taking off  regular upper body clothing?: A Little Help from another person to put on and taking off regular lower body clothing?: A Little 6 Click Score: 19    End of Session Equipment Utilized During Treatment: Oxygen  OT Visit Diagnosis: Other abnormalities of gait and mobility (R26.89);Other (comment);Muscle weakness (generalized) (M62.81) (decreased activity tolerance)   Activity Tolerance Patient tolerated treatment well   Patient Left in chair;with call bell/phone within reach   Nurse Communication Mobility status;Precautions        Time: 7341-9379 OT Time Calculation (min): 14 min  Charges: OT General Charges $OT Visit: 1 Visit OT Treatments $Self Care/Home Management : 8-22 mins  Jolaine Artist, OT [place] Pager [phone] Office [place] 10/28/2020, 3:20 PM

## 2020-10-29 ENCOUNTER — Encounter (HOSPITAL_COMMUNITY): Payer: Self-pay | Admitting: Internal Medicine

## 2020-10-29 ENCOUNTER — Inpatient Hospital Stay (HOSPITAL_COMMUNITY): Payer: Medicare Other

## 2020-10-29 DIAGNOSIS — J810 Acute pulmonary edema: Secondary | ICD-10-CM

## 2020-10-29 DIAGNOSIS — J449 Chronic obstructive pulmonary disease, unspecified: Secondary | ICD-10-CM | POA: Diagnosis not present

## 2020-10-29 DIAGNOSIS — I361 Nonrheumatic tricuspid (valve) insufficiency: Secondary | ICD-10-CM

## 2020-10-29 DIAGNOSIS — N186 End stage renal disease: Secondary | ICD-10-CM | POA: Diagnosis not present

## 2020-10-29 DIAGNOSIS — J9601 Acute respiratory failure with hypoxia: Secondary | ICD-10-CM | POA: Diagnosis not present

## 2020-10-29 LAB — CULTURE, BODY FLUID W GRAM STAIN -BOTTLE: Culture: NO GROWTH

## 2020-10-29 LAB — ECHOCARDIOGRAM COMPLETE
Area-P 1/2: 2.91 cm2
Height: 62 in
S' Lateral: 2.21 cm
Weight: 1940.05 oz

## 2020-10-29 NOTE — Progress Notes (Signed)
  Echocardiogram 2D Echocardiogram has been performed.  Fidel Levy 10/29/2020, 3:27 PM

## 2020-10-29 NOTE — Progress Notes (Signed)
PROGRESS NOTE    [name]  GXQ:[id number] DOB: [birth year] DOA: 10/21/2020 PCP: Biagio Borg, MD    Brief Narrative:  Cassandra Dyer is a 79 year old female with past medical history significant for ESRD on HD, history of renal cell carcinoma right kidney status post nephrectomy, essential hypertension, anemia of chronic kidney disease who presented to the ED with complaint of shortness of breath, weakness.  Missed hemodialysis on Monday.  Baseline oxygen requirement of 2 L but now up to 4 L to maintain adequate oxygenation.  In the ED, chest x-ray notable for bilateral pleural effusions and interstitial edema.  EDP consulted hospitalist group for admission secondary to acute on chronic hypoxic respiratory failure secondary to volume overload/pulmonary edema/pleural effusion.   Assessment & Plan:   Principal Problem:   Pulmonary edema Active Problems:   Essential hypertension   Renal cell carcinoma of right kidney (HCC)   ESRD (end stage renal disease) (HCC)   COPD (chronic obstructive pulmonary disease) (HCC)   Acute pulmonary edema (HCC)   Acute respiratory failure with hypoxia (HCC)   Acute on chronic hypoxic respiratory failure 2/2 pulmonary edema and bilateral pleural effusions COPD Patient presenting from home with progressive shortness of breath, missed HD session.  Chest x-ray on admission notable for bilateral pleural effusions, interstitial edema from volume overload.  Baseline oxygen 2 L per nasal cannula, Continues on 4 L per nasal cannula.  Underwent ultrasound-guided left thoracentesis with 1 L fluid removed on 11/11 and right thoracentesis on 11/12 with 700 mL removed.  Lights criteria consistent with transudative effusion. --Fluid restriction 1.2 L daily --net negative 8.6L since admission --Continue supplemental oxygen, titrate to maintain SPO2 greater than 88%; currently on 2L [place] at rest but has required increased oxygen with exertion/ambulation  today --Continue further volume management per nephrology; next HD 11/17 --TTE pending --repeat CXR in am; may need repeat thoracentesis  Essential hypertension On Carvedilol 12.5 mg p.o. BID, irbesartan 300 mg p.o. daily, amlodipine 10 mg p.o. daily at home; discontinued by nephrology 11/16; may be contributing to persistent volume overload --Continue to monitor blood pressure closely --TTE pending  HLD: Continue Crestor 10 mg p.o. daily  Anemia of chronic kidney disease Hemoglobin 8.5, stable --repeat CBC in the am   DVT prophylaxis: SCDs Code Status: Full code Family Communication: No family present at bedside this morning.  Disposition Plan:  Status is: Inpatient  Remains inpatient appropriate because:Unsafe d/c plan and Inpatient level of care appropriate due to severity of illness   Dispo: The patient is from: Home              Anticipated d/c is to: Home              Anticipated d/c date is: 1 day              Patient currently is not medically stable to d/c.  Continues with significant hypoxia on ambulation   Consultants:   Nephrology  Interventional radiology  Procedures:  Thoracentesis, left 10/24/2020; 1L removed Thoracentesis, right 10/25/2020; 700 mL removed  Antimicrobials:   None   Subjective: Patient seen and examined at bedside, sitting comfortably in bedside chair.  No significant dyspnea at rest, but when ambulated once again this morning significant desaturation to 80% on 2-3 L.  Discussed with nephrology, discontinuing antihypertensives and plan for further aggressive ultrafiltrate with HD tomorrow.  No other questions or concerns at this time.  No family present this morning. Denies headache, no dizziness, no  chest pain, no palpitations, no abdominal pain, no nausea/vomiting/diarrhea.  No acute events overnight per nursing staff.  Objective: Vitals:   10/28/20 1714 10/29/20 0031 10/29/20 0438 10/29/20 0837  BP: (!) 117/50 (!) 123/46 (!)  136/55 121/70  Pulse: 86 88 87 89  Resp: 17 18 18    Temp: 98.9 F (37.2 C) 98.7 F (37.1 C) 98.9 F (37.2 C)   TempSrc: Oral Oral Oral   SpO2: 100% 100% 98%   Weight:      Height:        Intake/Output Summary (Last 24 hours) at 10/29/2020 1131 Last data filed at 10/29/2020 0842 Gross per 24 hour  Intake 337 ml  Output 2500 ml  Net -2163 ml   Filed Weights   10/27/20 0500 10/28/20 0720 10/28/20 1139  Weight: 57.2 kg 57.7 kg 55 kg    Examination:  General exam: Appears calm and comfortable  Respiratory system: Breath sounds slightly decreased bilateral bases with crackles. Respiratory effort normal.  On 2 L nasal cannula with SPO2 99% at rest; desatted to 80% on 3 L while ambulating Cardiovascular system: S1 & S2 heard, RRR. No JVD, murmurs, rubs, gallops or clicks. No pedal edema.  Other noted Gastrointestinal system: Abdomen is nondistended, soft and nontender. No organomegaly or masses felt. Normal bowel sounds heard. Central nervous system: Alert and oriented. No focal neurological deficits. Extremities: Symmetric 5 x 5 power. Skin: No rashes, lesions or ulcers Psychiatry: Judgement and insight appear normal. Mood & affect appropriate.     Data Reviewed: I have personally reviewed following labs and imaging studies  CBC: Recent Labs  Lab 10/23/20 0417 10/25/20 0707 10/25/20 0817 10/28/20 0115  WBC 15.7* 16.3* 16.3* 15.1*  NEUTROABS 12.9*  --   --   --   HGB 8.3* 8.1* 8.0* 8.5*  HCT 26.5* 25.1* 24.8* 26.9*  MCV 98.5 95.4 99.2 95.7  PLT 237 260 262 073   Basic Metabolic Panel: Recent Labs  Lab 10/23/20 0808 10/25/20 0203 10/25/20 0707 10/28/20 0115  NA 136 131* 131* 130*  K 3.2* 3.8 4.1 4.5  CL 99 97* 96* 95*  CO2 28 26 24 23   GLUCOSE 93 99 80 86  BUN 14 28* 31* 37*  CREATININE 4.08* 6.73* 7.11* 7.24*  CALCIUM 8.6* 8.6* 8.6* 8.8*  MG  --  1.9  --   --   PHOS 2.2*  --  3.1  --    GFR: Estimated Creatinine Clearance: 5.1 mL/min (A) (by C-G  formula based on SCr of 7.24 mg/dL (H)). Liver Function Tests: Recent Labs  Lab 10/23/20 0808 10/25/20 0707  ALBUMIN 1.8* 1.6*   No results for input(s): LIPASE, AMYLASE in the last 168 hours. No results for input(s): AMMONIA in the last 168 hours. Coagulation Profile: No results for input(s): INR, PROTIME in the last 168 hours. Cardiac Enzymes: No results for input(s): CKTOTAL, CKMB, CKMBINDEX, TROPONINI in the last 168 hours. BNP (last 3 results) No results for input(s): PROBNP in the last 8760 hours. HbA1C: No results for input(s): HGBA1C in the last 72 hours. CBG: No results for input(s): GLUCAP in the last 168 hours. Lipid Profile: No results for input(s): CHOL, HDL, LDLCALC, TRIG, CHOLHDL, LDLDIRECT in the last 72 hours. Thyroid Function Tests: No results for input(s): TSH, T4TOTAL, FREET4, T3FREE, THYROIDAB in the last 72 hours. Anemia Panel: No results for input(s): VITAMINB12, FOLATE, FERRITIN, TIBC, IRON, RETICCTPCT in the last 72 hours. Sepsis Labs: No results for input(s): PROCALCITON, LATICACIDVEN in the last 168  hours.  Recent Results (from the past 240 hour(s))  Respiratory Panel by RT PCR (Flu A&B, Covid) - Nasopharyngeal Swab     Status: None   Collection Time: 10/21/20  6:04 PM   Specimen: Nasopharyngeal Swab  Result Value Ref Range Status   SARS Coronavirus 2 by RT PCR NEGATIVE NEGATIVE Final    Comment: (NOTE) SARS-CoV-2 target nucleic acids are NOT DETECTED.  The SARS-CoV-2 RNA is generally detectable in upper respiratoy specimens during the acute phase of infection. The lowest concentration of SARS-CoV-2 viral copies this assay can detect is 131 copies/mL. A negative result does not preclude SARS-Cov-2 infection and should not be used as the sole basis for treatment or other patient management decisions. A negative result may occur with  improper specimen collection/handling, submission of specimen other than nasopharyngeal swab, presence of viral  mutation(s) within the areas targeted by this assay, and inadequate number of viral copies (<131 copies/mL). A negative result must be combined with clinical observations, patient history, and epidemiological information. The expected result is Negative.  Fact Sheet for Patients:  PinkCheek.be  Fact Sheet for Healthcare Providers:  GravelBags.it  This test is no t yet approved or cleared by the [place] FDA and  has been authorized for detection and/or diagnosis of SARS-CoV-2 by FDA under an Emergency Use Authorization (EUA). This EUA will remain  in effect (meaning this test can be used) for the duration of the COVID-19 declaration under Section 564(b)(1) of the Act, 21 U.S.C. section 360bbb-3(b)(1), unless the authorization is terminated or revoked sooner.     Influenza A by PCR NEGATIVE NEGATIVE Final   Influenza B by PCR NEGATIVE NEGATIVE Final    Comment: (NOTE) The Xpert Xpress SARS-CoV-2/FLU/RSV assay is intended as an aid in  the diagnosis of influenza from Nasopharyngeal swab specimens and  should not be used as a sole basis for treatment. Nasal washings and  aspirates are unacceptable for Xpert Xpress SARS-CoV-2/FLU/RSV  testing.  Fact Sheet for Patients: PinkCheek.be  Fact Sheet for Healthcare Providers: GravelBags.it  This test is not yet approved or cleared by the [place] FDA and  has been authorized for detection and/or diagnosis of SARS-CoV-2 by  FDA under an Emergency Use Authorization (EUA). This EUA will remain  in effect (meaning this test can be used) for the duration of the  Covid-19 declaration under Section 564(b)(1) of the Act, 21  U.S.C. section 360bbb-3(b)(1), unless the authorization is  terminated or revoked. Performed at [facility], [place] [address], [place], [place] [address]   Gram stain     Status: None    Collection Time: 10/24/20 11:20 AM   Specimen: Lung, Left; Pleural Fluid  Result Value Ref Range Status   Specimen Description LUNG  Final   Special Requests NONE  Final   Gram Stain   Final    WBC PRESENT,BOTH PMN AND MONONUCLEAR NO ORGANISMS SEEN CYTOSPIN SMEAR Performed at [facility], [address] [place], [place] [address]    Report Status 10/24/2020 FINAL  Final  Culture, body fluid-bottle     Status: None (Preliminary result)   Collection Time: 10/24/20 11:21 AM   Specimen: Fluid  Result Value Ref Range Status   Specimen Description FLUID  Final   Special Requests NONE  Final   Culture   Final    NO GROWTH 4 DAYS Performed at [facility] [address], [place], [place] [address]    Report Status PENDING  Incomplete         Radiology Studies: DG CHEST PORT 1 VIEW  Result Date: 10/28/2020 CLINICAL DATA:  Dyspnea, end-stage renal disease EXAM: PORTABLE CHEST 1 VIEW COMPARISON:  10/25/2020 FINDINGS: Right internal jugular hemodialysis catheter is seen with its tip at the superior right atrium. Pulmonary insufflation is normal. Large left and small right pleural effusions are present and have enlarged in size since prior examination. Superimposed mild interstitial pulmonary edema has progressed in the interval since prior examination. No pneumothorax. Cardiac size is at the upper limits of normal. No acute bone abnormality. IMPRESSION: Progressive cardiogenic failure or volume overload with progressive interstitial pulmonary infiltrate and enlarging bilateral pleural effusions, left greater than right. Electronically Signed   By: Fidela Salisbury MD   On: 10/28/2020 06:42        Scheduled Meds: . (feeding supplement) PROSource Plus  30 mL Oral BID BM  . Chlorhexidine Gluconate Cloth  6 each Topical Q0600  . darbepoetin (ARANESP) injection - DIALYSIS  100 mcg Intravenous Q Fri-HD  . heparin  5,000 Units Subcutaneous Q8H  . heparin sodium (porcine)  1,000  Units Intravenous Q M,W,F-HD  . multivitamin  1 tablet Oral QHS  . rosuvastatin  10 mg Oral Daily   Continuous Infusions:   LOS: 6 days    Time spent: 34 minutes spent on chart review, discussion with nursing staff, consultants, updating family and interview/physical exam; more than 50% of that time was spent in counseling and/or coordination of care.    Nour Scalise J [place] DO [facility] Available via Epic secure chat 7am-7pm After these hours, please refer to coverage provider listed on [email or web address] 10/29/2020, 11:31 AM

## 2020-10-29 NOTE — Progress Notes (Addendum)
[facility] Progress Note   Subjective:    Pt seen in room, on O2, sat's dropped w/ walking into 70's.   Objective Vitals:   10/29/20 0031 10/29/20 0438 10/29/20 0837 10/29/20 1156  BP: (!) 123/46 (!) 136/55 121/70 (!) 113/56  Pulse: 88 87 89 86  Resp: 18 18  18   Temp: 98.7 F (37.1 C) 98.9 F (37.2 C)  99.2 F (37.3 C)  TempSrc: Oral Oral  Oral  SpO2: 100% 98%  99%  Weight:      Height:       Physical Exam General:frail, elderly female in NAD Heart:RRR Lungs:BS down L base, R clear Abdomen:soft, NTND Ext: mild LE edema L>R Dialysis Access: R IJ TDC, LU AVF +b (small in size with collaterals)   Filed Weights   10/27/20 0500 10/28/20 0720 10/28/20 1139  Weight: 57.2 kg 57.7 kg 55 kg    Intake/Output Summary (Last 24 hours) at 10/29/2020 1424 Last data filed at 10/29/2020 0842 Gross per 24 hour  Intake 337 ml  Output --  Net 337 ml    Additional Objective Labs: Basic Metabolic Panel: Recent Labs  Lab 10/23/20 0808 10/23/20 0808 10/25/20 0203 10/25/20 0707 10/28/20 0115  NA 136   < > 131* 131* 130*  K 3.2*   < > 3.8 4.1 4.5  CL 99   < > 97* 96* 95*  CO2 28   < > 26 24 23   GLUCOSE 93   < > 99 80 86  BUN 14   < > 28* 31* 37*  CREATININE 4.08*   < > 6.73* 7.11* 7.24*  CALCIUM 8.6*   < > 8.6* 8.6* 8.8*  PHOS 2.2*  --   --  3.1  --    < > = values in this interval not displayed.   Liver Function Tests: Recent Labs  Lab 10/23/20 0808 10/25/20 0707  ALBUMIN 1.8* 1.6*   CBC: Recent Labs  Lab 10/23/20 0417 10/23/20 0417 10/25/20 0707 10/25/20 0817 10/28/20 0115  WBC 15.7*   < > 16.3* 16.3* 15.1*  NEUTROABS 12.9*  --   --   --   --   HGB 8.3*   < > 8.1* 8.0* 8.5*  HCT 26.5*   < > 25.1* 24.8* 26.9*  MCV 98.5  --  95.4 99.2 95.7  PLT 237   < > 260 262 297   < > = values in this interval not displayed.    Medications:  . (feeding supplement) PROSource Plus  30 mL Oral BID BM  . Chlorhexidine Gluconate Cloth  6 each Topical  Q0600  . darbepoetin (ARANESP) injection - DIALYSIS  100 mcg Intravenous Q Fri-HD  . heparin  5,000 Units Subcutaneous Q8H  . heparin sodium (porcine)  1,000 Units Intravenous Q M,W,F-HD  . multivitamin  1 tablet Oral QHS  . rosuvastatin  10 mg Oral Daily    Dialysis:  [facility] MWF   4h 62kg 2/2.5 bath Hep none TDC  L AVF maturing (use> 11/25)  mircera100 q 2 weeks, (last 10/27)  venofer 100 q tx right now, 11/3 hgb 8.3 11/1 k 4  Background:  79 year old female with COPD and ESRD presenting with weakness and increased O2 requirement.   Assessment/Plan: 1. Hypoxia- has a baseline O2 req2L. SOB/ resp failure appear to be all due to vol overload/ pulm edema and pleural effusions. Pt has lost a lot of body weight and is 7kg under prior dry wt now. Still has  fluid on and desat's w/ walking. Further thoracentesis may be needed, defer to pmd. Have decreased BP meds , cont to lower vol w/ HD tomorrow. ECHO ordered.  2. ESRD:normally MWF at [facility] via [facility]- Has only been on HD since August. Next HD Wed 3. HTN: mult meds at home , w/ vol lowering BP's down and meds reduced 4. Anemia of ESRD:on mircera and venofer at OP center- Hgb 9.5>8.3>8.0. Aranesp 115mcg givenwith HD 11/12 5. Metabolic Bone Disease:Phos ok.  Corrected Ca 10.5, use low Ca bath. not on vitamin D or sensipar at OP unit. No binder on OP med list 6. Nutrition: Renal diet w/fluid restrictions. Alb low 1.6 - protein supplements  Kelly Splinter, MD 10/29/2020, 2:24 PM

## 2020-10-29 NOTE — Progress Notes (Signed)
SATURATION QUALIFICATIONS: (This note is used to comply with regulatory documentation for home oxygen)  Patient Saturations on Room Air at Rest = 83%  Patient Saturations on Room Air while Ambulating = 72%  Patient Saturations on 2-3L Liters of oxygen while Ambulating = 80%  Please briefly explain why patient needs home oxygen: Pt needs oxygen to ambulate.

## 2020-10-30 ENCOUNTER — Inpatient Hospital Stay (HOSPITAL_COMMUNITY): Payer: Medicare Other

## 2020-10-30 DIAGNOSIS — N186 End stage renal disease: Secondary | ICD-10-CM | POA: Diagnosis not present

## 2020-10-30 DIAGNOSIS — J9601 Acute respiratory failure with hypoxia: Secondary | ICD-10-CM | POA: Diagnosis not present

## 2020-10-30 DIAGNOSIS — J449 Chronic obstructive pulmonary disease, unspecified: Secondary | ICD-10-CM | POA: Diagnosis not present

## 2020-10-30 DIAGNOSIS — J810 Acute pulmonary edema: Secondary | ICD-10-CM | POA: Diagnosis not present

## 2020-10-30 LAB — BASIC METABOLIC PANEL
Anion gap: 10 (ref 5–15)
BUN: 37 mg/dL — ABNORMAL HIGH (ref 8–23)
CO2: 27 mmol/L (ref 22–32)
Calcium: 8.8 mg/dL — ABNORMAL LOW (ref 8.9–10.3)
Chloride: 96 mmol/L — ABNORMAL LOW (ref 98–111)
Creatinine, Ser: 6.31 mg/dL — ABNORMAL HIGH (ref 0.44–1.00)
GFR, Estimated: 6 mL/min — ABNORMAL LOW (ref >60)
Glucose, Bld: 82 mg/dL (ref 70–99)
Potassium: 3.9 mmol/L (ref 3.5–5.1)
Sodium: 133 mmol/L — ABNORMAL LOW (ref 135–145)

## 2020-10-30 LAB — CBC
HCT: 28.8 % — ABNORMAL LOW (ref 36.0–46.0)
Hemoglobin: 9 g/dL — ABNORMAL LOW (ref 12.0–15.0)
MCH: 29.3 pg (ref 26.0–34.0)
MCHC: 31.3 g/dL (ref 30.0–36.0)
MCV: 93.8 fL (ref 80.0–100.0)
Platelets: 247 10*3/uL (ref 150–400)
RBC: 3.07 MIL/uL — ABNORMAL LOW (ref 3.87–5.11)
RDW: 17.3 % — ABNORMAL HIGH (ref 11.5–15.5)
WBC: 14.4 10*3/uL — ABNORMAL HIGH (ref 4.0–10.5)
nRBC: 0 % (ref 0.0–0.2)

## 2020-10-30 MED ORDER — HEPARIN SODIUM (PORCINE) 1000 UNIT/ML IJ SOLN
INTRAMUSCULAR | Status: AC
  Filled 2020-10-30: qty 1

## 2020-10-30 NOTE — Progress Notes (Signed)
Physical Therapy Treatment Patient Details Name: Cassandra Dyer MRN: [id number] DOB: 1941-01-15 Today's Date: 10/30/2020    History of Present Illness Pt is a 79 y/o female admitted secondary to worsening SOB. Found to have pulmonary edema. PMH includes HTN, R kidney carcinoma s/p nephrectomy, COPD on 2L, and ESRD on HD.     PT Comments    Pt was seen for mobility on RW with assistance for gait with min guard and observation of O2 sats during gait.  Pt is maintaining sats 90% or greater, with no complaints of SOB.  Took one short rest during her total walk and tolerated LE strengthening afterward.  Follow up with all goals of PT with concentration on her limited tolerance for standing work.  Monitor O2 sats as usual.   Follow Up Recommendations  Home health PT;Supervision for mobility/OOB     Equipment Recommendations  Other (comment)    Recommendations for Other Services       Precautions / Restrictions Precautions Precautions: Fall Precaution Comments: watch O2 sats Restrictions Weight Bearing Restrictions: No    Mobility  Bed Mobility Overal bed mobility: Modified Independent Bed Mobility: Sit to Supine;Supine to Sit     Supine to sit: Min guard Sit to supine: Min guard      Transfers Overall transfer level: Needs assistance Equipment used: Rolling walker (2 wheeled) Transfers: Sit to/from Stand Sit to Stand: Min guard            Ambulation/Gait Ambulation/Gait assistance: Counsellor (Feet): 120 Feet Assistive device: Rolling walker (2 wheeled) Gait Pattern/deviations: Step-through pattern;Decreased stride length;Drifts right/left     General Gait Details: pt refused ambulation   Stairs             Wheelchair Mobility    Modified Rankin (Stroke Patients Only)       Balance Overall balance assessment: Needs assistance Sitting-balance support: Feet supported Sitting balance-Leahy Scale: Good     Standing balance  support: Bilateral upper extremity supported Standing balance-Leahy Scale: Fair                              Cognition Arousal/Alertness: Awake/alert Behavior During Therapy: WFL for tasks assessed/performed Overall Cognitive Status: Within Functional Limits for tasks assessed                                        Exercises General Exercises - Lower Extremity Ankle Circles/Pumps: AAROM;5 reps Long Arc Quad: Strengthening;10 reps Heel Slides: Strengthening;10 reps Hip ABduction/ADduction: Strengthening;10 reps    General Comments General comments (skin integrity, edema, etc.): pt maintained O2 sats in 90's      Pertinent Vitals/Pain Pain Assessment: No/denies pain Faces Pain Scale: No hurt    Home Living                      Prior Function            PT Goals (current goals can now be found in the care plan section) Acute Rehab PT Goals Patient Stated Goal: home Progress towards PT goals: Progressing toward goals    Frequency    Min 3X/week      PT Plan Current plan remains appropriate    Co-evaluation              AM-PAC PT "6 Clicks" Mobility  Outcome Measure  Help needed turning from your back to your side while in a flat bed without using bedrails?: None Help needed moving from lying on your back to sitting on the side of a flat bed without using bedrails?: None Help needed moving to and from a bed to a chair (including a wheelchair)?: A Little Help needed standing up from a chair using your arms (e.g., wheelchair or bedside chair)?: A Little Help needed to walk in hospital room?: A Little Help needed climbing 3-5 steps with a railing? : A Lot 6 Click Score: 19    End of Session Equipment Utilized During Treatment: Gait belt Activity Tolerance: Patient tolerated treatment well Patient left: with call bell/phone within reach;in bed;with bed alarm set Nurse Communication: Mobility status PT Visit Diagnosis:  Other abnormalities of gait and mobility (R26.89);Difficulty in walking, not elsewhere classified (R26.2)     Time: 4801-6553 PT Time Calculation (min) (ACUTE ONLY): 31 min  Charges:  $Gait Training: 8-22 mins $Therapeutic Exercise: 8-22 mins                  Ramond Dial 10/30/2020, 10:10 PM  Mee Hives, PT MS Acute Rehab Dept. Number: [place]

## 2020-10-30 NOTE — TOC Progression Note (Signed)
Transition of Care [facility]) - Progression Note    Patient Details  Name: Cassandra [place] MRN: [id number] Date of Birth: 12-30-1940  [facility]) CM/SW Contact  Newell Frater, Edson Snowball, RN Phone Number: 10/30/2020, 11:23 AM  Clinical Narrative:     Charleen Kirks with [facility].   Following for possible home oxygen   Expected Discharge Plan: [place] Barriers to Discharge: Continued Medical Work up  Expected Discharge Plan and Services Expected Discharge Plan: [place]   Discharge Planning Services: CM Consult Post Acute Care Choice: [place] arrangements for the past 2 months: Single Family Home                 DME Arranged: N/A (patient does not want rollator at present) DME Agency: NA       HH Arranged: PT [place] Agency: Other - See comment ([place]) Date [place]: 10/23/20 Time [place]: [place] Representative spoke with at [place]: [place] ([place]) Interventions    Readmission Risk Interventions Readmission Risk Prevention Plan 08/08/2020  Transportation Screening Complete  Medication Review Press photographer) Complete  PCP or Specialist appointment within 3-5 days of discharge Complete  HRI or [place] Complete  SW Recovery Care/Counseling Consult Complete  [place] Not Applicable  Some recent data might be hidden

## 2020-10-30 NOTE — Progress Notes (Signed)
[facility] Progress Note   Subjective:    Seen in room. No new complaints. Still with increased O2 requirement. Bilat pleural effusions on CXR. Dialysis today   Objective Vitals:   10/29/20 1156 10/29/20 1647 10/29/20 2331 10/30/20 0441  BP: (!) 113/56 (!) 127/53 (!) 143/53 (!) 141/59  Pulse: 86 83 85 88  Resp: 18 18 18 18   Temp: 99.2 F (37.3 C) 98.7 F (37.1 C) 99.2 F (37.3 C) 98.8 F (37.1 C)  TempSrc: Oral Oral Oral Oral  SpO2: 99% 100% 100% 97%  Weight:    56.2 kg  Height:       Physical Exam General:frail, elderly female in NAD Heart:RRR Lungs:BS down L base, R clear Abdomen:soft, NTND Ext: mild LE edema L>R Dialysis Access: R IJ TDC, LU AVF +b (small in size with collaterals)   Filed Weights   10/28/20 0720 10/28/20 1139 10/30/20 0441  Weight: 57.7 kg 55 kg 56.2 kg   No intake or output data in the 24 hours ending 10/30/20 0933  Additional Objective Labs: Basic Metabolic Panel: Recent Labs  Lab 10/25/20 0707 10/28/20 0115 10/30/20 0253  NA 131* 130* 133*  K 4.1 4.5 3.9  CL 96* 95* 96*  CO2 24 23 27   GLUCOSE 80 86 82  BUN 31* 37* 37*  CREATININE 7.11* 7.24* 6.31*  CALCIUM 8.6* 8.8* 8.8*  PHOS 3.1  --   --    Liver Function Tests: Recent Labs  Lab 10/25/20 0707  ALBUMIN 1.6*   CBC: Recent Labs  Lab 10/25/20 0707 10/25/20 0707 10/25/20 0817 10/28/20 0115 10/30/20 0253  WBC 16.3*   < > 16.3* 15.1* 14.4*  HGB 8.1*   < > 8.0* 8.5* 9.0*  HCT 25.1*   < > 24.8* 26.9* 28.8*  MCV 95.4  --  99.2 95.7 93.8  PLT 260   < > 262 297 247   < > = values in this interval not displayed.    Medications:   (feeding supplement) PROSource Plus  30 mL Oral BID BM   Chlorhexidine Gluconate Cloth  6 each Topical Q0600   darbepoetin (ARANESP) injection - DIALYSIS  100 mcg Intravenous Q Fri-HD   heparin  5,000 Units Subcutaneous Q8H   heparin sodium (porcine)  1,000 Units Intravenous Q M,W,F-HD   multivitamin  1 tablet Oral QHS    rosuvastatin  10 mg Oral Daily    Dialysis:  [facility] MWF   4h 62kg 2/2.5 bath Hep none TDC  L AVF maturing (use> 11/25)  mircera100 q 2 weeks, (last 10/27)  venofer 100 q tx right now, 11/3 hgb 8.3 11/1 k 4  Background:  79 year old female with COPD and ESRD presenting with weakness and increased O2 requirement.   Assessment/Plan: 1. Hypoxia- has a baseline O2 req2L. SOB/ resp failure appear to be all due to vol overload/ pulm edema and pleural effusions. Pt has lost a lot of body weight and is 7kg under prior dry wt now. Still has fluid on and desat's w/ walking. Further thoracentesis may be needed, defer to pmd. Have decreased BP meds , cont to lower vol w/ HD. Normal EF on Echo.  2. ESRD:normally MWF at [facility]- Has only been on HD since August. Next HD Wed 3. HTN: mult meds at home , w/ vol lowering BP's down and meds reduced 4. Anemia of ESRD:on mircera and venofer at OP center- Hgb 9.0. Aranesp 156mcg givenwith HD 11/12 5. Metabolic Bone Disease:Phos ok.  Corrected  Ca elevated.  use low Ca bath. not on vitamin D or sensipar at OP unit. No binder on OP med list 6. Nutrition: Renal diet w/fluid restrictions. Alb low 1.6 - protein supplements  Lynnda Child PA-C [facility] 10/30/2020,9:34 AM

## 2020-10-30 NOTE — Progress Notes (Signed)
PROGRESS NOTE    Cassandra Dyer  IRC:[id number] DOB: [birth year] DOA: 10/21/2020 PCP: Biagio Borg, MD    Brief Narrative:  Cassandra Dyer is a 79 year old female with past medical history significant for ESRD on HD, history of renal cell carcinoma right kidney status post nephrectomy, essential hypertension, anemia of chronic kidney disease who presented to the ED with complaint of shortness of breath, weakness.  Missed hemodialysis on Monday.  Baseline oxygen requirement of 2 L but now up to 4 L to maintain adequate oxygenation.  In the ED, chest x-ray notable for bilateral pleural effusions and interstitial edema.  EDP consulted hospitalist group for admission secondary to acute on chronic hypoxic respiratory failure secondary to volume overload/pulmonary edema/pleural effusion.   Assessment & Plan:   Principal Problem:   Pulmonary edema Active Problems:   Essential hypertension   Renal cell carcinoma of right kidney (HCC)   ESRD (end stage renal disease) (HCC)   COPD (chronic obstructive pulmonary disease) (HCC)   Acute pulmonary edema (HCC)   Acute respiratory failure with hypoxia (HCC)   Acute on chronic hypoxic respiratory failure 2/2 pulmonary edema and bilateral pleural effusions COPD Patient presenting from home with progressive shortness of breath, missed HD session.  Chest x-ray on admission notable for bilateral pleural effusions, interstitial edema from volume overload.  Baseline oxygen 2 L per nasal cannula, Continues on 4 L per nasal cannula.  Underwent ultrasound-guided left thoracentesis with 1 L fluid removed on 11/11 and right thoracentesis on 11/12 with 700 mL removed.  Lights criteria consistent with transudative effusion. TTE with LVEF 51-02%, grade 2 diastolic dysfunction, mild aortic valve sclerosis without stenosis, normal IVC. --Fluid restriction 1.2 L daily --net negative 8.2L since admission --Continue supplemental oxygen, titrate to maintain SPO2 greater  than 88%; currently on 2L [place] at rest but has required increased oxygen with exertion/ambulation  --Continue further volume management per nephrology; next HD today --repeat CXR today with bilateral pleural effusions with bibasilar atelectasis; may need repeat thoracentesis if continues with exertional hypoxia despite HD today --Repeat O2 desaturation screen on 2 L nasal cannula following HD today  Essential hypertension On Carvedilol 12.5 mg p.o. BID, irbesartan 300 mg p.o. daily, amlodipine 10 mg p.o. daily at home; discontinued by nephrology 11/16; may be contributing to persistent volume overload --Continue to monitor blood pressure closely  HLD: Continue Crestor 10 mg p.o. daily  Anemia of chronic kidney disease Hemoglobin 8.5, stable --repeat CBC in the am   DVT prophylaxis: SCDs Code Status: Full code Family Communication: No family present at bedside this morning.  Disposition Plan:  Status is: Inpatient  Remains inpatient appropriate because:Unsafe d/c plan and Inpatient level of care appropriate due to severity of illness   Dispo: The patient is from: Home              Anticipated d/c is to: Home              Anticipated d/c date is: 1 day              Patient currently is not medically stable to d/c.  Continues with significant hypoxia on ambulation, attempting increased UF today with HD, may need repeat thoracentesis   Consultants:   Nephrology  Interventional radiology  Procedures:  Thoracentesis, left 10/24/2020; 1L removed Thoracentesis, right 10/25/2020; 700 mL removed  Antimicrobials:   None   Subjective: Patient seen and examined at bedside, lying comfortably in bed.  States dyspnea is at baseline at rest  but is significantly worsened with any type of minimal exertion/walking.  Awaiting for further HD today.  Discussed need of possible repeat thoracentesis.  Will have her walk once again after HD to assess ambulatory O2 needs. No other questions or  concerns at this time.  No family present this morning. Denies headache, no dizziness, no chest pain, no palpitations, no abdominal pain, no nausea/vomiting/diarrhea.  No acute events overnight per nursing staff.  Objective: Vitals:   10/29/20 1156 10/29/20 1647 10/29/20 2331 10/30/20 0441  BP: (!) 113/56 (!) 127/53 (!) 143/53 (!) 141/59  Pulse: 86 83 85 88  Resp: 18 18 18 18   Temp: 99.2 F (37.3 C) 98.7 F (37.1 C) 99.2 F (37.3 C) 98.8 F (37.1 C)  TempSrc: Oral Oral Oral Oral  SpO2: 99% 100% 100% 97%  Weight:    56.2 kg  Height:       No intake or output data in the 24 hours ending 10/30/20 1032 Filed Weights   10/28/20 0720 10/28/20 1139 10/30/20 0441  Weight: 57.7 kg 55 kg 56.2 kg    Examination:  General exam: Appears calm and comfortable  Respiratory system: Breath sounds decreased bilateral bases with crackles. Respiratory effort normal.  On 2 L nasal cannula with SPO2 97% at rest; desatted to 80% on 3 L while ambulating yesterday Cardiovascular system: S1 & S2 heard, RRR. No JVD, murmurs, rubs, gallops or clicks. No pedal edema.  Other noted Gastrointestinal system: Abdomen is nondistended, soft and nontender. No organomegaly or masses felt. Normal bowel sounds heard. Central nervous system: Alert and oriented. No focal neurological deficits. Extremities: Symmetric 5 x 5 power. Skin: No rashes, lesions or ulcers Psychiatry: Judgement and insight appear normal. Mood & affect appropriate.     Data Reviewed: I have personally reviewed following labs and imaging studies  CBC: Recent Labs  Lab 10/25/20 0707 10/25/20 0817 10/28/20 0115 10/30/20 0253  WBC 16.3* 16.3* 15.1* 14.4*  HGB 8.1* 8.0* 8.5* 9.0*  HCT 25.1* 24.8* 26.9* 28.8*  MCV 95.4 99.2 95.7 93.8  PLT 260 262 297 258   Basic Metabolic Panel: Recent Labs  Lab 10/25/20 0203 10/25/20 0707 10/28/20 0115 10/30/20 0253  NA 131* 131* 130* 133*  K 3.8 4.1 4.5 3.9  CL 97* 96* 95* 96*  CO2 26 24 23 27     GLUCOSE 99 80 86 82  BUN 28* 31* 37* 37*  CREATININE 6.73* 7.11* 7.24* 6.31*  CALCIUM 8.6* 8.6* 8.8* 8.8*  MG 1.9  --   --   --   PHOS  --  3.1  --   --    GFR: Estimated Creatinine Clearance: 5.9 mL/min (A) (by C-G formula based on SCr of 6.31 mg/dL (H)). Liver Function Tests: Recent Labs  Lab 10/25/20 0707  ALBUMIN 1.6*   No results for input(s): LIPASE, AMYLASE in the last 168 hours. No results for input(s): AMMONIA in the last 168 hours. Coagulation Profile: No results for input(s): INR, PROTIME in the last 168 hours. Cardiac Enzymes: No results for input(s): CKTOTAL, CKMB, CKMBINDEX, TROPONINI in the last 168 hours. BNP (last 3 results) No results for input(s): PROBNP in the last 8760 hours. HbA1C: No results for input(s): HGBA1C in the last 72 hours. CBG: No results for input(s): GLUCAP in the last 168 hours. Lipid Profile: No results for input(s): CHOL, HDL, LDLCALC, TRIG, CHOLHDL, LDLDIRECT in the last 72 hours. Thyroid Function Tests: No results for input(s): TSH, T4TOTAL, FREET4, T3FREE, THYROIDAB in the last 72 hours. Anemia  Panel: No results for input(s): VITAMINB12, FOLATE, FERRITIN, TIBC, IRON, RETICCTPCT in the last 72 hours. Sepsis Labs: No results for input(s): PROCALCITON, LATICACIDVEN in the last 168 hours.  Recent Results (from the past 240 hour(s))  Respiratory Panel by RT PCR (Flu A&B, Covid) - Nasopharyngeal Swab     Status: None   Collection Time: 10/21/20  6:04 PM   Specimen: Nasopharyngeal Swab  Result Value Ref Range Status   SARS Coronavirus 2 by RT PCR NEGATIVE NEGATIVE Final    Comment: (NOTE) SARS-CoV-2 target nucleic acids are NOT DETECTED.  The SARS-CoV-2 RNA is generally detectable in upper respiratoy specimens during the acute phase of infection. The lowest concentration of SARS-CoV-2 viral copies this assay can detect is 131 copies/mL. A negative result does not preclude SARS-Cov-2 infection and should not be used as the sole  basis for treatment or other patient management decisions. A negative result may occur with  improper specimen collection/handling, submission of specimen other than nasopharyngeal swab, presence of viral mutation(s) within the areas targeted by this assay, and inadequate number of viral copies (<131 copies/mL). A negative result must be combined with clinical observations, patient history, and epidemiological information. The expected result is Negative.  Fact Sheet for Patients:  PinkCheek.be  Fact Sheet for Healthcare Providers:  GravelBags.it  This test is no t yet approved or cleared by the [place] FDA and  has been authorized for detection and/or diagnosis of SARS-CoV-2 by FDA under an Emergency Use Authorization (EUA). This EUA will remain  in effect (meaning this test can be used) for the duration of the COVID-19 declaration under Section 564(b)(1) of the Act, 21 U.S.C. section 360bbb-3(b)(1), unless the authorization is terminated or revoked sooner.     Influenza A by PCR NEGATIVE NEGATIVE Final   Influenza B by PCR NEGATIVE NEGATIVE Final    Comment: (NOTE) The Xpert Xpress SARS-CoV-2/FLU/RSV assay is intended as an aid in  the diagnosis of influenza from Nasopharyngeal swab specimens and  should not be used as a sole basis for treatment. Nasal washings and  aspirates are unacceptable for Xpert Xpress SARS-CoV-2/FLU/RSV  testing.  Fact Sheet for Patients: PinkCheek.be  Fact Sheet for Healthcare Providers: GravelBags.it  This test is not yet approved or cleared by the [place] FDA and  has been authorized for detection and/or diagnosis of SARS-CoV-2 by  FDA under an Emergency Use Authorization (EUA). This EUA will remain  in effect (meaning this test can be used) for the duration of the  Covid-19 declaration under Section 564(b)(1) of the  Act, 21  U.S.C. section 360bbb-3(b)(1), unless the authorization is  terminated or revoked. Performed at [facility], [place] [address] [place], [place] [address]   Gram stain     Status: None   Collection Time: 10/24/20 11:20 AM   Specimen: Lung, Left; Pleural Fluid  Result Value Ref Range Status   Specimen Description LUNG  Final   Special Requests NONE  Final   Gram Stain   Final    WBC PRESENT,BOTH PMN AND MONONUCLEAR NO ORGANISMS SEEN CYTOSPIN SMEAR Performed at [facility], [address] [place], [place] [address]    Report Status 10/24/2020 FINAL  Final  Culture, body fluid-bottle     Status: None   Collection Time: 10/24/20 11:21 AM   Specimen: Fluid  Result Value Ref Range Status   Specimen Description FLUID  Final   Special Requests NONE  Final   Culture   Final  NO GROWTH 5 DAYS Performed at [facility], [place] [address] [place], [place] [address]    Report Status 10/29/2020 FINAL  Final         Radiology Studies: DG CHEST PORT 1 VIEW  Result Date: 10/30/2020 CLINICAL DATA:  Shortness of breath EXAM: PORTABLE CHEST 1 VIEW COMPARISON:  October 28, 2020 FINDINGS: Central catheter tip is at the cavoatrial junction. No pneumothorax. There are pleural effusions bilaterally with bibasilar atelectasis. Heart is mildly enlarged with pulmonary vascularity normal. No adenopathy. There is aortic atherosclerosis. No bone lesions. IMPRESSION: Central catheter as described without pneumothorax. Pleural effusions bilaterally with bibasilar atelectasis. It should be noted that superimposed pneumonia in the bases cannot be excluded by radiography. No new opacity appreciable. Stable cardiac silhouette. Aortic Atherosclerosis (ICD10-I70.0). Electronically Signed   By: Lowella Grip III M.D.   On: 10/30/2020 08:15   ECHOCARDIOGRAM COMPLETE  Result Date: 10/29/2020    ECHOCARDIOGRAM REPORT   Patient Name:   Cassandra Dyer Date of Exam: 10/29/2020 Medical  Rec #:  [id number]       Height:       62.0 in Accession #:    [id number]      Weight:       121.3 lb Date of Birth:  01-Oct-1941       BSA:          1.545 m Patient Age:    36 years        BP:           113/56 mmHg Patient Gender: F               HR:           83 bpm. Exam Location:  Inpatient Procedure: 2D Echo, Cardiac Doppler and Color Doppler Indications:    Fluid Overload  History:        Patient has no prior history of Echocardiogram examinations.                 Risk Factors:Hypertension and Dyslipidemia.  Sonographer:    Bernadene Person RDCS Referring Phys: [id number]  1. Left ventricular ejection fraction, by estimation, is 60 to 65%. The left ventricle has normal function. The left ventricle has no regional wall motion abnormalities. Left ventricular diastolic parameters are consistent with Grade II diastolic dysfunction (pseudonormalization).  2. Right ventricular systolic function is normal. The right ventricular size is mildly enlarged. There is moderately elevated pulmonary artery systolic pressure. The estimated right ventricular systolic pressure is 54.6 mmHg.  3. The mitral valve is grossly normal. No evidence of mitral valve regurgitation. No evidence of mitral stenosis.  4. The aortic valve is abnormal. There is mild calcification of the aortic valve. Aortic valve regurgitation is not visualized. Mild aortic valve sclerosis is present, with no evidence of aortic valve stenosis.  5. The inferior vena cava is normal in size with greater than 50% respiratory variability, suggesting right atrial pressure of 3 mmHg. FINDINGS  Left Ventricle: Left ventricular ejection fraction, by estimation, is 60 to 65%. The left ventricle has normal function. The left ventricle has no regional wall motion abnormalities. The left ventricular internal cavity size was normal in size. There is  no left ventricular hypertrophy. Left ventricular diastolic parameters are consistent with Grade II diastolic  dysfunction (pseudonormalization). Right Ventricle: The right ventricular size is mildly enlarged. No increase in right ventricular wall thickness. Right ventricular systolic function is normal. There is moderately elevated pulmonary artery  systolic pressure. The tricuspid regurgitant velocity is 3.50 m/s, and with an assumed right atrial pressure of 3 mmHg, the estimated right ventricular systolic pressure is 76.5 mmHg. Left Atrium: Left atrial size was normal in size. Right Atrium: Right atrial size was normal in size. Pericardium: There is no evidence of pericardial effusion. Mitral Valve: The mitral valve is grossly normal. No evidence of mitral valve regurgitation. No evidence of mitral valve stenosis. Tricuspid Valve: The tricuspid valve is normal in structure. Tricuspid valve regurgitation is mild . No evidence of tricuspid stenosis. Aortic Valve: The aortic valve is abnormal. There is mild calcification of the aortic valve. Aortic valve regurgitation is not visualized. Mild aortic valve sclerosis is present, with no evidence of aortic valve stenosis. Pulmonic Valve: The pulmonic valve was grossly normal. Pulmonic valve regurgitation is mild to moderate. No evidence of pulmonic stenosis. Aorta: The aortic root is normal in size and structure. Venous: The inferior vena cava is normal in size with greater than 50% respiratory variability, suggesting right atrial pressure of 3 mmHg. IAS/Shunts: No atrial level shunt detected by color flow Doppler. Additional Comments: A venous catheter is visualized in the right atrium. There is a moderate pleural effusion.  LEFT VENTRICLE PLAX 2D LVIDd:         4.17 cm  Diastology LVIDs:         2.21 cm  LV e' medial:    4.90 cm/s LV PW:         0.59 cm  LV E/e' medial:  22.0 LV IVS:        0.64 cm  LV e' lateral:   8.05 cm/s LVOT diam:     2.10 cm  LV E/e' lateral: 13.4 LV SV:         90 LV SV Index:   58 LVOT Area:     3.46 cm  RIGHT VENTRICLE TAPSE (M-mode): 1.9 cm LEFT  ATRIUM             Index       RIGHT ATRIUM           Index LA diam:        3.70 cm 2.39 cm/m  RA Area:     14.50 cm LA Vol (A2C):   49.5 ml 32.03 ml/m RA Volume:   36.30 ml  23.49 ml/m LA Vol (A4C):   46.9 ml 30.35 ml/m LA Biplane Vol: 50.1 ml 32.42 ml/m  AORTIC VALVE LVOT Vmax:   145.00 cm/s LVOT Vmean:  93.500 cm/s LVOT VTI:    0.259 m  AORTA Ao Root diam: 2.70 cm Ao Asc diam:  2.10 cm MITRAL VALVE                TRICUSPID VALVE MV Area (PHT): 2.91 cm     TR Peak grad:   49.0 mmHg MV Decel Time: 261 msec     TR Vmax:        350.00 cm/s MV E velocity: 108.00 cm/s MV A velocity: 124.00 cm/s  SHUNTS MV E/A ratio:  0.87         Systemic VTI:  0.26 m                             Systemic Diam: 2.10 cm Cherlynn Kaiser MD Electronically signed by Cherlynn Kaiser MD Signature Date/Time: 10/29/2020/4:59:56 PM    Final         Scheduled Meds: . (feeding supplement) PROSource  Plus  30 mL Oral BID BM  . Chlorhexidine Gluconate Cloth  6 each Topical Q0600  . darbepoetin (ARANESP) injection - DIALYSIS  100 mcg Intravenous Q Fri-HD  . heparin  5,000 Units Subcutaneous Q8H  . heparin sodium (porcine)  1,000 Units Intravenous Q M,W,F-HD  . multivitamin  1 tablet Oral QHS  . rosuvastatin  10 mg Oral Daily   Continuous Infusions:   LOS: 7 days    Time spent: 34 minutes spent on chart review, discussion with nursing staff, consultants, updating family and interview/physical exam; more than 50% of that time was spent in counseling and/or coordination of care.    Bryar Rennie J [place] DO [facility] Available via Epic secure chat 7am-7pm After these hours, please refer to coverage provider listed on [email or web address] 10/30/2020, 10:32 AM

## 2020-10-31 ENCOUNTER — Inpatient Hospital Stay (HOSPITAL_COMMUNITY): Payer: Medicare Other

## 2020-10-31 DIAGNOSIS — N186 End stage renal disease: Secondary | ICD-10-CM | POA: Diagnosis not present

## 2020-10-31 DIAGNOSIS — J9601 Acute respiratory failure with hypoxia: Secondary | ICD-10-CM | POA: Diagnosis not present

## 2020-10-31 DIAGNOSIS — J810 Acute pulmonary edema: Secondary | ICD-10-CM | POA: Diagnosis not present

## 2020-10-31 DIAGNOSIS — J449 Chronic obstructive pulmonary disease, unspecified: Secondary | ICD-10-CM | POA: Diagnosis not present

## 2020-10-31 HISTORY — PX: IR THORACENTESIS ASP PLEURAL SPACE W/IMG GUIDE: IMG5380

## 2020-10-31 MED ORDER — LIDOCAINE HCL 1 % IJ SOLN
INTRAMUSCULAR | Status: DC | PRN
  Administered 2020-10-31: 10 mL

## 2020-10-31 MED ORDER — HEPARIN SODIUM (PORCINE) 1000 UNIT/ML IJ SOLN
1000.0000 [IU] | INTRAMUSCULAR | Status: DC | PRN
  Administered 2020-11-01 – 2020-11-04 (×2): 1000 [IU]
  Filled 2020-10-31 (×3): qty 1

## 2020-10-31 MED ORDER — LIDOCAINE HCL 1 % IJ SOLN
INTRAMUSCULAR | Status: AC
  Filled 2020-10-31: qty 20

## 2020-10-31 NOTE — Care Management Important Message (Signed)
Important Message  Patient Details  Name: Cassandra Dyer MRN: [id number] Date of Birth: 1941-01-06   Medicare Important Message Given:  Yes     Barb Merino Monona 10/31/2020, 2:51 PM

## 2020-10-31 NOTE — Procedures (Signed)
PROCEDURE SUMMARY:  Successful US guided left therapeutic thoracentesis. Yielded 650 mL of amber fluid. Pt tolerated procedure well. No immediate complications.  Specimen was not sent for labs. CXR ordered.  EBL < 5 mL  Docia Barrier PA-C 10/31/2020 11:52 AM

## 2020-10-31 NOTE — Progress Notes (Signed)
PROGRESS NOTE    LASHAY OSBORNE  NFA:[id number] DOB: [birth year] DOA: 10/21/2020 PCP: Biagio Borg, MD    Brief Narrative:  Cassandra Dyer is a 79 year old female with past medical history significant for ESRD on HD, history of renal cell carcinoma right kidney status post nephrectomy, essential hypertension, anemia of chronic kidney disease who presented to the ED with complaint of shortness of breath, weakness.  Missed hemodialysis on Monday.  Baseline oxygen requirement of 2 L but now up to 4 L to maintain adequate oxygenation.  In the ED, chest x-ray notable for bilateral pleural effusions and interstitial edema.  EDP consulted hospitalist group for admission secondary to acute on chronic hypoxic respiratory failure secondary to volume overload/pulmonary edema/pleural effusion.   Assessment & Plan:   Principal Problem:   Pulmonary edema Active Problems:   Essential hypertension   Renal cell carcinoma of right kidney (HCC)   ESRD (end stage renal disease) (HCC)   COPD (chronic obstructive pulmonary disease) (HCC)   Acute pulmonary edema (HCC)   Acute respiratory failure with hypoxia (HCC)   Acute on chronic hypoxic respiratory failure 2/2 pulmonary edema and bilateral pleural effusions COPD Patient presenting from home with progressive shortness of breath, missed HD session.  Chest x-ray on admission notable for bilateral pleural effusions, interstitial edema from volume overload.  Baseline oxygen 2 L per nasal cannula, Continues on 4 L per nasal cannula.  Underwent ultrasound-guided left thoracentesis with 1 L fluid removed on 11/11 and right thoracentesis on 11/12 with 700 mL removed.  Lights criteria consistent with transudative effusion. TTE with LVEF 46-96%, grade 2 diastolic dysfunction, mild aortic valve sclerosis without stenosis, normal IVC. --Fluid restriction 1.2 L daily --net negative 11.2L since admission --Continue supplemental oxygen, titrate to maintain SPO2  greater than 88%; currently on 2L [place] at rest; ambulated better this morning without any significant hypoxia --Continue further volume management per nephrology; next HD tomorrow --Repeat thoracentesis today, IR consulted  Essential hypertension On Carvedilol 12.5 mg p.o. BID, irbesartan 300 mg p.o. daily, amlodipine 10 mg p.o. daily at home; discontinued by nephrology 11/16; may be contributing to persistent volume overload --Continue to monitor blood pressure closely  HLD: Continue Crestor 10 mg p.o. daily  Anemia of chronic kidney disease Hemoglobin 8.5, stable --repeat CBC in the am   DVT prophylaxis: SCDs Code Status: Full code Family Communication: No family present at bedside this morning.  Disposition Plan:  Status is: Inpatient  Remains inpatient appropriate because:Unsafe d/c plan and Inpatient level of care appropriate due to severity of illness   Dispo: The patient is from: Home              Anticipated d/c is to: Home              Anticipated d/c date is: 1 day              Patient currently is not medically stable to d/c.  Plan repeat thoracentesis by IR today, HD tomorrow, repeat walk test on 2 L following HD tomorrow, if no desaturations likely DC home with home health   Consultants:   Nephrology  Interventional radiology  Procedures:  Thoracentesis, left 10/24/2020; 1L removed Thoracentesis, right 10/25/2020; 700 mL removed  Antimicrobials:   None   Subjective: Patient seen and examined at bedside, sitting in bedside chair.  Just finished walking with nursing staff down the hallway with no further significant desaturations today.  Does continue with mild dyspnea which is not her baseline.  On 2 L nasal cannula at rest.  Patient requesting repeat thoracentesis to remove further fluid today.  We will have HD once again tomorrow and discussed with patient will likely discharge home thereafter.  Discussed with nephrology this morning. No other questions or  concerns at this time.  No family present this morning. Denies headache, no dizziness, no chest pain, no palpitations, no abdominal pain, no nausea/vomiting/diarrhea.  No acute events overnight per nursing staff.  Objective: Vitals:   10/30/20 1810 10/30/20 1812 10/31/20 0029 10/31/20 0500  BP: (!) 90/53 109/71 (!) 137/50 (!) 145/56  Pulse:  82 90 90  Resp: (!) 21  18 18   Temp:  97.9 F (36.6 C) 98.7 F (37.1 C) 99 F (37.2 C)  TempSrc:  Oral Oral Oral  SpO2:  98% 100% 100%  Weight:  53.2 kg  53 kg  Height:        Intake/Output Summary (Last 24 hours) at 10/31/2020 1152 Last data filed at 10/31/2020 0741 Gross per 24 hour  Intake 240 ml  Output 3002 ml  Net -2762 ml   Filed Weights   10/30/20 0441 10/30/20 1812 10/31/20 0500  Weight: 56.2 kg 53.2 kg 53 kg    Examination:  General exam: Appears calm and comfortable  Respiratory system: Breath sounds decreased bilateral bases with crackles. Respiratory effort normal.  On 2 L nasal cannula with SPO2 100% at rest Cardiovascular system: S1 & S2 heard, RRR. No JVD, murmurs, rubs, gallops or clicks. No pedal edema.  Other noted Gastrointestinal system: Abdomen is nondistended, soft and nontender. No organomegaly or masses felt. Normal bowel sounds heard. Central nervous system: Alert and oriented. No focal neurological deficits. Extremities: Symmetric 5 x 5 power. Skin: No rashes, lesions or ulcers Psychiatry: Judgement and insight appear normal. Mood & affect appropriate.     Data Reviewed: I have personally reviewed following labs and imaging studies  CBC: Recent Labs  Lab 10/25/20 0707 10/25/20 0817 10/28/20 0115 10/30/20 0253  WBC 16.3* 16.3* 15.1* 14.4*  HGB 8.1* 8.0* 8.5* 9.0*  HCT 25.1* 24.8* 26.9* 28.8*  MCV 95.4 99.2 95.7 93.8  PLT 260 262 297 373   Basic Metabolic Panel: Recent Labs  Lab 10/25/20 0203 10/25/20 0707 10/28/20 0115 10/30/20 0253  NA 131* 131* 130* 133*  K 3.8 4.1 4.5 3.9  CL 97* 96*  95* 96*  CO2 26 24 23 27   GLUCOSE 99 80 86 82  BUN 28* 31* 37* 37*  CREATININE 6.73* 7.11* 7.24* 6.31*  CALCIUM 8.6* 8.6* 8.8* 8.8*  MG 1.9  --   --   --   PHOS  --  3.1  --   --    GFR: Estimated Creatinine Clearance: 5.9 mL/min (A) (by C-G formula based on SCr of 6.31 mg/dL (H)). Liver Function Tests: Recent Labs  Lab 10/25/20 0707  ALBUMIN 1.6*   No results for input(s): LIPASE, AMYLASE in the last 168 hours. No results for input(s): AMMONIA in the last 168 hours. Coagulation Profile: No results for input(s): INR, PROTIME in the last 168 hours. Cardiac Enzymes: No results for input(s): CKTOTAL, CKMB, CKMBINDEX, TROPONINI in the last 168 hours. BNP (last 3 results) No results for input(s): PROBNP in the last 8760 hours. HbA1C: No results for input(s): HGBA1C in the last 72 hours. CBG: No results for input(s): GLUCAP in the last 168 hours. Lipid Profile: No results for input(s): CHOL, HDL, LDLCALC, TRIG, CHOLHDL, LDLDIRECT in the last 72 hours. Thyroid Function Tests: No results for  input(s): TSH, T4TOTAL, FREET4, T3FREE, THYROIDAB in the last 72 hours. Anemia Panel: No results for input(s): VITAMINB12, FOLATE, FERRITIN, TIBC, IRON, RETICCTPCT in the last 72 hours. Sepsis Labs: No results for input(s): PROCALCITON, LATICACIDVEN in the last 168 hours.  Recent Results (from the past 240 hour(s))  Respiratory Panel by RT PCR (Flu A&B, Covid) - Nasopharyngeal Swab     Status: None   Collection Time: 10/21/20  6:04 PM   Specimen: Nasopharyngeal Swab  Result Value Ref Range Status   SARS Coronavirus 2 by RT PCR NEGATIVE NEGATIVE Final    Comment: (NOTE) SARS-CoV-2 target nucleic acids are NOT DETECTED.  The SARS-CoV-2 RNA is generally detectable in upper respiratoy specimens during the acute phase of infection. The lowest concentration of SARS-CoV-2 viral copies this assay can detect is 131 copies/mL. A negative result does not preclude SARS-Cov-2 infection and should  not be used as the sole basis for treatment or other patient management decisions. A negative result may occur with  improper specimen collection/handling, submission of specimen other than nasopharyngeal swab, presence of viral mutation(s) within the areas targeted by this assay, and inadequate number of viral copies (<131 copies/mL). A negative result must be combined with clinical observations, patient history, and epidemiological information. The expected result is Negative.  Fact Sheet for Patients:  PinkCheek.be  Fact Sheet for Healthcare Providers:  GravelBags.it  This test is no t yet approved or cleared by the [place] FDA and  has been authorized for detection and/or diagnosis of SARS-CoV-2 by FDA under an Emergency Use Authorization (EUA). This EUA will remain  in effect (meaning this test can be used) for the duration of the COVID-19 declaration under Section 564(b)(1) of the Act, 21 U.S.C. section 360bbb-3(b)(1), unless the authorization is terminated or revoked sooner.     Influenza A by PCR NEGATIVE NEGATIVE Final   Influenza B by PCR NEGATIVE NEGATIVE Final    Comment: (NOTE) The Xpert Xpress SARS-CoV-2/FLU/RSV assay is intended as an aid in  the diagnosis of influenza from Nasopharyngeal swab specimens and  should not be used as a sole basis for treatment. Nasal washings and  aspirates are unacceptable for Xpert Xpress SARS-CoV-2/FLU/RSV  testing.  Fact Sheet for Patients: PinkCheek.be  Fact Sheet for Healthcare Providers: GravelBags.it  This test is not yet approved or cleared by the [place] FDA and  has been authorized for detection and/or diagnosis of SARS-CoV-2 by  FDA under an Emergency Use Authorization (EUA). This EUA will remain  in effect (meaning this test can be used) for the duration of the  Covid-19 declaration under  Section 564(b)(1) of the Act, 21  U.S.C. section 360bbb-3(b)(1), unless the authorization is  terminated or revoked. Performed at [facility], [place] [address], [place], [place] [address]   Gram stain     Status: None   Collection Time: 10/24/20 11:20 AM   Specimen: Lung, Left; Pleural Fluid  Result Value Ref Range Status   Specimen Description LUNG  Final   Special Requests NONE  Final   Gram Stain   Final    WBC PRESENT,BOTH PMN AND MONONUCLEAR NO ORGANISMS SEEN CYTOSPIN SMEAR Performed at [facility], [address], [place], [place] [address]    Report Status 10/24/2020 FINAL  Final  Culture, body fluid-bottle     Status: None   Collection Time: 10/24/20 11:21 AM   Specimen: Fluid  Result Value Ref Range Status   Specimen Description FLUID  Final  Special Requests NONE  Final   Culture   Final    NO GROWTH 5 DAYS Performed at [facility], [place] [address], [place], [place] [address]    Report Status 10/29/2020 FINAL  Final         Radiology Studies: DG CHEST PORT 1 VIEW  Result Date: 10/30/2020 CLINICAL DATA:  Shortness of breath EXAM: PORTABLE CHEST 1 VIEW COMPARISON:  October 28, 2020 FINDINGS: Central catheter tip is at the cavoatrial junction. No pneumothorax. There are pleural effusions bilaterally with bibasilar atelectasis. Heart is mildly enlarged with pulmonary vascularity normal. No adenopathy. There is aortic atherosclerosis. No bone lesions. IMPRESSION: Central catheter as described without pneumothorax. Pleural effusions bilaterally with bibasilar atelectasis. It should be noted that superimposed pneumonia in the bases cannot be excluded by radiography. No new opacity appreciable. Stable cardiac silhouette. Aortic Atherosclerosis (ICD10-I70.0). Electronically Signed   By: Lowella Grip III M.D.   On: 10/30/2020 08:15   ECHOCARDIOGRAM COMPLETE  Result Date: 10/29/2020    ECHOCARDIOGRAM REPORT   Patient Name:   Cassandra Dyer Date of  Exam: 10/29/2020 Medical Rec #:  [id number]       Height:       62.0 in Accession #:    [id number]      Weight:       121.3 lb Date of Birth:  [birth year]       BSA:          1.545 m Patient Age:    54 years        BP:           113/56 mmHg Patient Gender: F               HR:           83 bpm. Exam Location:  Inpatient Procedure: 2D Echo, Cardiac Doppler and Color Doppler Indications:    Fluid Overload  History:        Patient has no prior history of Echocardiogram examinations.                 Risk Factors:Hypertension and Dyslipidemia.  Sonographer:    Bernadene Person RDCS Referring Phys: [id number]  1. Left ventricular ejection fraction, by estimation, is 60 to 65%. The left ventricle has normal function. The left ventricle has no regional wall motion abnormalities. Left ventricular diastolic parameters are consistent with Grade II diastolic dysfunction (pseudonormalization).  2. Right ventricular systolic function is normal. The right ventricular size is mildly enlarged. There is moderately elevated pulmonary artery systolic pressure. The estimated right ventricular systolic pressure is 93.8 mmHg.  3. The mitral valve is grossly normal. No evidence of mitral valve regurgitation. No evidence of mitral stenosis.  4. The aortic valve is abnormal. There is mild calcification of the aortic valve. Aortic valve regurgitation is not visualized. Mild aortic valve sclerosis is present, with no evidence of aortic valve stenosis.  5. The inferior vena cava is normal in size with greater than 50% respiratory variability, suggesting right atrial pressure of 3 mmHg. FINDINGS  Left Ventricle: Left ventricular ejection fraction, by estimation, is 60 to 65%. The left ventricle has normal function. The left ventricle has no regional wall motion abnormalities. The left ventricular internal cavity size was normal in size. There is  no left ventricular hypertrophy. Left ventricular diastolic parameters are  consistent with Grade II diastolic dysfunction (pseudonormalization). Right Ventricle: The right ventricular size is mildly enlarged. No increase in right ventricular  wall thickness. Right ventricular systolic function is normal. There is moderately elevated pulmonary artery systolic pressure. The tricuspid regurgitant velocity is 3.50 m/s, and with an assumed right atrial pressure of 3 mmHg, the estimated right ventricular systolic pressure is 19.3 mmHg. Left Atrium: Left atrial size was normal in size. Right Atrium: Right atrial size was normal in size. Pericardium: There is no evidence of pericardial effusion. Mitral Valve: The mitral valve is grossly normal. No evidence of mitral valve regurgitation. No evidence of mitral valve stenosis. Tricuspid Valve: The tricuspid valve is normal in structure. Tricuspid valve regurgitation is mild . No evidence of tricuspid stenosis. Aortic Valve: The aortic valve is abnormal. There is mild calcification of the aortic valve. Aortic valve regurgitation is not visualized. Mild aortic valve sclerosis is present, with no evidence of aortic valve stenosis. Pulmonic Valve: The pulmonic valve was grossly normal. Pulmonic valve regurgitation is mild to moderate. No evidence of pulmonic stenosis. Aorta: The aortic root is normal in size and structure. Venous: The inferior vena cava is normal in size with greater than 50% respiratory variability, suggesting right atrial pressure of 3 mmHg. IAS/Shunts: No atrial level shunt detected by color flow Doppler. Additional Comments: A venous catheter is visualized in the right atrium. There is a moderate pleural effusion.  LEFT VENTRICLE PLAX 2D LVIDd:         4.17 cm  Diastology LVIDs:         2.21 cm  LV e' medial:    4.90 cm/s LV PW:         0.59 cm  LV E/e' medial:  22.0 LV IVS:        0.64 cm  LV e' lateral:   8.05 cm/s LVOT diam:     2.10 cm  LV E/e' lateral: 13.4 LV SV:         90 LV SV Index:   58 LVOT Area:     3.46 cm  RIGHT  VENTRICLE TAPSE (M-mode): 1.9 cm LEFT ATRIUM             Index       RIGHT ATRIUM           Index LA diam:        3.70 cm 2.39 cm/m  RA Area:     14.50 cm LA Vol (A2C):   49.5 ml 32.03 ml/m RA Volume:   36.30 ml  23.49 ml/m LA Vol (A4C):   46.9 ml 30.35 ml/m LA Biplane Vol: 50.1 ml 32.42 ml/m  AORTIC VALVE LVOT Vmax:   145.00 cm/s LVOT Vmean:  93.500 cm/s LVOT VTI:    0.259 m  AORTA Ao Root diam: 2.70 cm Ao Asc diam:  2.10 cm MITRAL VALVE                TRICUSPID VALVE MV Area (PHT): 2.91 cm     TR Peak grad:   49.0 mmHg MV Decel Time: 261 msec     TR Vmax:        350.00 cm/s MV E velocity: 108.00 cm/s MV A velocity: 124.00 cm/s  SHUNTS MV E/A ratio:  0.87         Systemic VTI:  0.26 m                             Systemic Diam: 2.10 cm Cherlynn Kaiser MD Electronically signed by Cherlynn Kaiser MD Signature Date/Time: 10/29/2020/4:59:56 PM    Final  Scheduled Meds: . (feeding supplement) PROSource Plus  30 mL Oral BID BM  . Chlorhexidine Gluconate Cloth  6 each Topical Q0600  . darbepoetin (ARANESP) injection - DIALYSIS  100 mcg Intravenous Q Fri-HD  . heparin  5,000 Units Subcutaneous Q8H  . heparin sodium (porcine)  1,000 Units Intravenous Q M,W,F-HD  . lidocaine      . multivitamin  1 tablet Oral QHS  . rosuvastatin  10 mg Oral Daily   Continuous Infusions:   LOS: 8 days    Time spent: 35 minutes spent on chart review, discussion with nursing staff, consultants, updating family and interview/physical exam; more than 50% of that time was spent in counseling and/or coordination of care.    Ashlen Kiger J [place] DO [facility] Available via Epic secure chat 7am-7pm After these hours, please refer to coverage provider listed on [email or web address] 10/31/2020, 11:52 AM

## 2020-10-31 NOTE — Progress Notes (Signed)
SATURATION QUALIFICATIONS: (This note is used to comply with regulatory documentation for home oxygen)  Patient Saturations on Room Air at Rest = 94%  Patient Saturations on Room Air while Ambulating = 84%  Patient Saturations on 2 Liters of oxygen while Ambulating = 92%  Her oxygen sat did drop to 89% on 2L while walking and talking but as soon as she stopped talking her saturations came back up to 92% on the 2L.

## 2020-10-31 NOTE — Progress Notes (Signed)
[facility] Progress Note   Subjective:    Tolerated 3L UF with HD yesterday.  Eating ice throughout the day. Counseled her that ice = fluid  May have another thoracentesis today.   Objective Vitals:   10/30/20 1810 10/30/20 1812 10/31/20 0029 10/31/20 0500  BP: (!) 90/53 109/71 (!) 137/50 (!) 145/56  Pulse:  82 90 90  Resp: (!) 21  18 18   Temp:  97.9 F (36.6 C) 98.7 F (37.1 C) 99 F (37.2 C)  TempSrc:  Oral Oral Oral  SpO2:  98% 100% 100%  Weight:  53.2 kg  53 kg  Height:       Physical Exam General:frail, elderly female in NAD Heart:RRR Lungs:BS decreased/dim at bases R >L  Abdomen:soft, NTND Ext: mild LE edema L>R Dialysis Access: R IJ TDC, LU AVF +b (small in size with collaterals)   Filed Weights   10/30/20 0441 10/30/20 1812 10/31/20 0500  Weight: 56.2 kg 53.2 kg 53 kg    Intake/Output Summary (Last 24 hours) at 10/31/2020 1012 Last data filed at 10/31/2020 0741 Gross per 24 hour  Intake 240 ml  Output 3002 ml  Net -2762 ml    Additional Objective Labs: Basic Metabolic Panel: Recent Labs  Lab 10/25/20 0707 10/28/20 0115 10/30/20 0253  NA 131* 130* 133*  K 4.1 4.5 3.9  CL 96* 95* 96*  CO2 24 23 27   GLUCOSE 80 86 82  BUN 31* 37* 37*  CREATININE 7.11* 7.24* 6.31*  CALCIUM 8.6* 8.8* 8.8*  PHOS 3.1  --   --    Liver Function Tests: Recent Labs  Lab 10/25/20 0707  ALBUMIN 1.6*   CBC: Recent Labs  Lab 10/25/20 0707 10/25/20 0707 10/25/20 0817 10/28/20 0115 10/30/20 0253  WBC 16.3*   < > 16.3* 15.1* 14.4*  HGB 8.1*   < > 8.0* 8.5* 9.0*  HCT 25.1*   < > 24.8* 26.9* 28.8*  MCV 95.4  --  99.2 95.7 93.8  PLT 260   < > 262 297 247   < > = values in this interval not displayed.    Medications:  . (feeding supplement) PROSource Plus  30 mL Oral BID BM  . Chlorhexidine Gluconate Cloth  6 each Topical Q0600  . darbepoetin (ARANESP) injection - DIALYSIS  100 mcg Intravenous Q Fri-HD  . heparin  5,000 Units Subcutaneous  Q8H  . heparin sodium (porcine)  1,000 Units Intravenous Q M,W,F-HD  . multivitamin  1 tablet Oral QHS  . rosuvastatin  10 mg Oral Daily    Dialysis:  [facility] MWF   4h 62kg 2/2.5 bath Hep none TDC  L AVF maturing (use> 11/25)  mircera100 q 2 weeks, (last 10/27)  venofer 100 q tx right now, 11/3 hgb 8.3 11/1 k 4  Background:  79 year old female with COPD and ESRD presenting with weakness and increased O2 requirement.   Assessment/Plan: 1. Hypoxia- has a baseline O2 req2L. SOB/ resp failure appear to be all due to vol overload/ pulm edema and pleural effusions. Weights have come down about 10 kgs since admission with HD and thoracentesis x2.  Is off all  BP meds.  Pleural effusions remain on CXR,- plan for another thoracentesis today per PMD. Cont to lower vol w/ HD. Normal EF on Echo.  2. ESRD:normally MWF at [place] via [facility]- Has only been on HD since August. Next HD 11/19 3. HTN: mult meds at home , w/ vol lowering BP's down and meds stopped.  4. Anemia of ESRD:on mircera and venofer at OP center- Hgb 9.0. Aranesp 167mcg givenwith HD 11/12 5. Metabolic Bone Disease:Phos ok.  Corrected Ca elevated.  use low Ca bath. not on vitamin D or sensipar at OP unit. No binder on OP med list 6. Nutrition: Renal diet w/fluid restrictions. Alb low 1.6 - protein supplements  Lynnda Child PA-C [facility] 10/31/2020,10:12 AM

## 2020-10-31 NOTE — Progress Notes (Signed)
Occupational Therapy Treatment Patient Details Name: Cassandra Dyer MRN: [id number] DOB: 1941/07/25 Today's Date: 10/31/2020    History of present illness Pt is a 79 y/o female admitted secondary to worsening SOB. Found to have pulmonary edema. PMH includes HTN, R kidney carcinoma s/p nephrectomy, COPD on 2L, and ESRD on HD.    OT comments  Patient progressing well towards OT goals, eager to get OOB today.  Functional mobility and transfers in room with supervision, LB dressing simulated with supervision and grooming at sink with modified independence. Patient on 2L (baseline) and SpO2 maintained throughout session. Discussed safety and energy conservation techniques, activity progression for IADL engagement at dc--pt verbalized understanding. Updated dc recommendations to no OT follow up at dc.  Will follow acutely.    Follow Up Recommendations  No OT follow up;Supervision - Intermittent    Equipment Recommendations  3 in 1 bedside commode    Recommendations for Other Services      Precautions / Restrictions Precautions Precautions: Fall Precaution Comments: watch O2 sats Restrictions Weight Bearing Restrictions: No       Mobility Bed Mobility Overal bed mobility: Modified Independent                Transfers Overall transfer level: Needs assistance Equipment used: None Transfers: Sit to/from Stand Sit to Stand: Supervision              Balance Overall balance assessment: Needs assistance Sitting-balance support: No upper extremity supported;Feet supported Sitting balance-Leahy Scale: Good     Standing balance support: No upper extremity supported;During functional activity Standing balance-Leahy Scale: Fair                             ADL either performed or assessed with clinical judgement   ADL       Grooming: Independent;Standing;Oral care               Lower Body Dressing: Supervision/safety;Sit to/from stand   Toilet  Transfer: Supervision/safety;Ambulation           Functional mobility during ADLs: Supervision/safety General ADL Comments: pt progressing well towards OT goals      Vision       Perception     Praxis      Cognition Arousal/Alertness: Awake/alert Behavior During Therapy: WFL for tasks assessed/performed Overall Cognitive Status: Within Functional Limits for tasks assessed                                          Exercises     Shoulder Instructions       General Comments pt on 2L [place], SpO2 99-100%    Pertinent Vitals/ Pain       Pain Assessment: No/denies pain  Home Living                                          Prior Functioning/Environment              Frequency  Min 2X/week        Progress Toward Goals  OT Goals(current goals can now be found in the care plan section)  Progress towards OT goals: Progressing toward goals  Acute Rehab OT Goals Patient Stated Goal: home OT Goal Formulation: With  patient  Plan Discharge plan needs to be updated;Frequency remains appropriate    Co-evaluation                 AM-PAC OT "6 Clicks" Daily Activity     Outcome Measure   Help from another person eating meals?: None Help from another person taking care of personal grooming?: A Little Help from another person toileting, which includes using toliet, bedpan, or urinal?: A Little Help from another person bathing (including washing, rinsing, drying)?: A Little Help from another person to put on and taking off regular upper body clothing?: A Little Help from another person to put on and taking off regular lower body clothing?: A Little 6 Click Score: 19    End of Session Equipment Utilized During Treatment: Oxygen  OT Visit Diagnosis: Other abnormalities of gait and mobility (R26.89);Other (comment);Muscle weakness (generalized) (M62.81)   Activity Tolerance Patient tolerated treatment well   Patient Left in  chair;with call bell/phone within reach   Nurse Communication Mobility status        Time: 1610-9604 OT Time Calculation (min): 15 min  Charges: OT General Charges $OT Visit: 1 Visit OT Treatments $Self Care/Home Management : 8-22 mins  Jolaine Artist, OT [place] Pager [phone] Office [place] 10/31/2020, 2:40 PM

## 2020-11-01 DIAGNOSIS — J810 Acute pulmonary edema: Secondary | ICD-10-CM | POA: Diagnosis not present

## 2020-11-01 DIAGNOSIS — N186 End stage renal disease: Secondary | ICD-10-CM | POA: Diagnosis not present

## 2020-11-01 DIAGNOSIS — J449 Chronic obstructive pulmonary disease, unspecified: Secondary | ICD-10-CM | POA: Diagnosis not present

## 2020-11-01 DIAGNOSIS — J9601 Acute respiratory failure with hypoxia: Secondary | ICD-10-CM | POA: Diagnosis not present

## 2020-11-01 LAB — CBC
HCT: 27.8 % — ABNORMAL LOW (ref 36.0–46.0)
Hemoglobin: 9.2 g/dL — ABNORMAL LOW (ref 12.0–15.0)
MCH: 31.8 pg (ref 26.0–34.0)
MCHC: 33.1 g/dL (ref 30.0–36.0)
MCV: 96.2 fL (ref 80.0–100.0)
Platelets: 248 10*3/uL (ref 150–400)
RBC: 2.89 MIL/uL — ABNORMAL LOW (ref 3.87–5.11)
RDW: 18.3 % — ABNORMAL HIGH (ref 11.5–15.5)
WBC: 14.5 10*3/uL — ABNORMAL HIGH (ref 4.0–10.5)
nRBC: 0 % (ref 0.0–0.2)

## 2020-11-01 LAB — BASIC METABOLIC PANEL
Anion gap: 10 (ref 5–15)
BUN: 34 mg/dL — ABNORMAL HIGH (ref 8–23)
CO2: 27 mmol/L (ref 22–32)
Calcium: 9 mg/dL (ref 8.9–10.3)
Chloride: 96 mmol/L — ABNORMAL LOW (ref 98–111)
Creatinine, Ser: 6.4 mg/dL — ABNORMAL HIGH (ref 0.44–1.00)
GFR, Estimated: 6 mL/min — ABNORMAL LOW (ref >60)
Glucose, Bld: 91 mg/dL (ref 70–99)
Potassium: 3.6 mmol/L (ref 3.5–5.1)
Sodium: 133 mmol/L — ABNORMAL LOW (ref 135–145)

## 2020-11-01 MED ORDER — PENTAFLUOROPROP-TETRAFLUOROETH EX AERO: 1.0000 "application " | INHALATION_SPRAY | CUTANEOUS | Status: DC | PRN

## 2020-11-01 MED ORDER — ALTEPLASE 2 MG IJ SOLR
2.0000 mg | Freq: Once | INTRAMUSCULAR | Status: DC | PRN
  Filled 2020-11-01: qty 2

## 2020-11-01 MED ORDER — SODIUM CHLORIDE 0.9 % IV SOLN: 100.0000 mL | INTRAVENOUS | Status: DC | PRN

## 2020-11-01 MED ORDER — DARBEPOETIN ALFA 100 MCG/0.5ML IJ SOSY
PREFILLED_SYRINGE | INTRAMUSCULAR | Status: AC
  Administered 2020-11-01: 100 ug
  Filled 2020-11-01: qty 0.5

## 2020-11-01 MED ORDER — LIDOCAINE-PRILOCAINE 2.5-2.5 % EX CREA: 1.0000 "application " | TOPICAL_CREAM | CUTANEOUS | Status: DC | PRN

## 2020-11-01 MED ORDER — HEPARIN SODIUM (PORCINE) 1000 UNIT/ML IJ SOLN
INTRAMUSCULAR | Status: AC
  Administered 2020-11-01: 4000 [IU]
  Filled 2020-11-01: qty 4

## 2020-11-01 MED ORDER — LIDOCAINE HCL (PF) 1 % IJ SOLN: 5.0000 mL | INTRAMUSCULAR | Status: DC | PRN

## 2020-11-01 MED ORDER — HEPARIN SODIUM (PORCINE) 1000 UNIT/ML DIALYSIS
1000.0000 [IU] | INTRAMUSCULAR | Status: DC | PRN
  Filled 2020-11-01: qty 1

## 2020-11-01 NOTE — Progress Notes (Signed)
Pt already left for HD during shift report at this time.

## 2020-11-01 NOTE — Progress Notes (Signed)
PT Cancellation Note  Patient Details Name: Cassandra Dyer MRN: [id number] DOB: 20-May-1941   Cancelled Treatment:    Reason Eval/Treat Not Completed: Other (comment) patient still eating lunch- will attempt to return if time/schedule allow, if not we will see on next date of service.   Windell Norfolk, DPT, PN1   Supplemental Physical Therapist [facility]    Pager [phone] Acute Rehab Office [phone]

## 2020-11-01 NOTE — Progress Notes (Addendum)
[facility] Progress Note   Subjective:    Seen in HD unit. UF goal 3L, Tolerating so far.  No complaints this am.    Objective Vitals:   11/01/20 0710 11/01/20 0715 11/01/20 0725 11/01/20 0742  BP: (!) 131/59 (!) 159/59 (!) 147/69 (!) 145/62  Pulse: 95 92 92 95  Resp: 13 (!) 24 (!) 23 (!) 26  Temp: 98.9 F (37.2 C)     TempSrc: Oral     SpO2: 98%     Weight: 52.6 kg     Height:       Physical Exam General:frail, elderly female in NAD Heart:RRR Lungs:BS decreased/dim at bases R >L  Abdomen:soft, NTND Ext: mild LE edema L>R Dialysis Access: R IJ TDC, LU AVF +b (small in size with collaterals)   Filed Weights   10/31/20 0500 11/01/20 0527 11/01/20 0710  Weight: 53 kg 52.6 kg 52.6 kg    Intake/Output Summary (Last 24 hours) at 11/01/2020 0825 Last data filed at 10/31/2020 1800 Gross per 24 hour  Intake 360 ml  Output --  Net 360 ml    Additional Objective Labs: Basic Metabolic Panel: Recent Labs  Lab 10/28/20 0115 10/30/20 0253 11/01/20 0123  NA 130* 133* 133*  K 4.5 3.9 3.6  CL 95* 96* 96*  CO2 23 27 27   GLUCOSE 86 82 91  BUN 37* 37* 34*  CREATININE 7.24* 6.31* 6.40*  CALCIUM 8.8* 8.8* 9.0   Liver Function Tests: No results for input(s): AST, ALT, ALKPHOS, BILITOT, PROT, ALBUMIN in the last 168 hours. CBC: Recent Labs  Lab 10/28/20 0115 10/30/20 0253 11/01/20 0123  WBC 15.1* 14.4* 14.5*  HGB 8.5* 9.0* 9.2*  HCT 26.9* 28.8* 27.8*  MCV 95.7 93.8 96.2  PLT 297 247 248    Medications: . [START ON 11/02/2020] sodium chloride    . [START ON 11/02/2020] sodium chloride     . (feeding supplement) PROSource Plus  30 mL Oral BID BM  . Chlorhexidine Gluconate Cloth  6 each Topical Q0600  . darbepoetin (ARANESP) injection - DIALYSIS  100 mcg Intravenous Q Fri-HD  . heparin  5,000 Units Subcutaneous Q8H  . multivitamin  1 tablet Oral QHS  . rosuvastatin  10 mg Oral Daily    D[facility]:  [facility] MWF   4h 62kg 2/2.5 bath Hep none TDC  L  AVF maturing (use> 11/25)  mircera100 q 2 weeks, (last 10/27)  venofer 100 q tx right now, 11/3 hgb 8.3 11/1 k 4  Background:  79 year old female with COPD and ESRD presenting with weakness and increased O2 requirement.   Assessment/Plan: 1. Hypoxia- has a baseline O2 req2L. SOB/ resp failure appear to be all due to vol overload/ pulm edema and pleural effusions. Weights have come down about 10 kgs since admission with HD and thoracentesis x2.  Is off all  BP meds.  May have another thoracentesis per PMD. Cont to lower vol w/ HD. Normal EF on Echo.  2. ESRD:normally MWF [facility]- Has only been on HD since August. HD today  3. HTN: mult meds at home , w/ vol lowering BP's down and meds stopped.  4. Anemia of ESRD:on mircera and venofer at OP center- Hgb 9.0. Aranesp 175mcg q Friday. Redose today.  5. Metabolic Bone Disease:Phos ok.  Corrected Ca elevated.  use low Ca bath. not on vitamin D or sensipar at OP unit. No binder on OP med list 6. Nutrition: Renal diet w/fluid restrictions. Alb low  1.6 - protein supplements 7. Dispo: Ok for discharge from renal standpoint   Lynnda Child PA-C [facility] 11/01/2020,8:25 AM   Pt seen, examined and agree w assess/plan as above with additions as indicated. Appears to be off oxygen today.  [facility] 11/01/2020, 4:00 PM

## 2020-11-01 NOTE — Progress Notes (Addendum)
PROGRESS NOTE    AVANTIKA SHERE  DVV:[id number] DOB: [birth year] DOA: 10/21/2020  PCP: Biagio Borg, MD    Brief Narrative:  Cassandra Dyer is a 79 year old female with past medical history significant for ESRD on HD, history of renal cell carcinoma right kidney status post nephrectomy, essential hypertension, anemia of chronic kidney disease who presented to the ED with complaint of shortness of breath, weakness.  Missed hemodialysis on Monday.  Baseline oxygen requirement of 2 L but now up to 4 L to maintain adequate oxygenation.  In the ED, chest x-ray notable for bilateral pleural effusions and interstitial edema.  EDP consulted hospitalist group for admission secondary to acute on chronic hypoxic respiratory failure secondary to volume overload/pulmonary edema/pleural effusion.   Assessment & Plan: Acute on chronic hypoxic respiratory failure 2/2 pulmonary edema and bilateral pleural effusions COPD Patient presenting from home with progressive shortness of breath, missed HD session.  Chest x-ray on admission notable for bilateral pleural effusions, interstitial edema from volume overload.  Baseline oxygen 2 L per nasal cannula, Continues on 4 L per nasal cannula.  Underwent ultrasound-guided left thoracentesis with 1 L fluid removed on 11/11 and right thoracentesis on 11/12 with 700 mL removed.  Lights criteria consistent with transudative effusion. TTE with LVEF 62-69%, grade 2 diastolic dysfunction, mild aortic valve sclerosis without stenosis, normal IVC. --Fluid restriction 1.2 L daily --net negative 11.2L since admission --Continue supplemental oxygen, titrate to maintain SPO2 greater than 88%; currently on 2L [place] at rest; ambulated better this morning without any significant hypoxia --Continue further volume management per nephrology; next HD tomorrow --Repeat thoracentesis today, IR consulted. --11/01/2020: pt s/p two thoracentesis and d/w her about d/c today pt states she is  still little sob and is not on oxygen at home. Pt echo showed ef of 60%: 1. Left ventricular ejection fraction, by estimation, is 60 to 65%. The left ventricle has normal function. The left ventricle has no regional wall motion abnormalities. Left ventricular diastolic parameters are consistent with Grade II diastolic dysfunction (pseudonormalization). 2. Right ventricular systolic function is normal. The right ventricular size is mildly enlarged. There is moderately elevated pulmonary artery systolic pressure. The estimated right ventricular systolic pressure is 48.5 mmHg. 3. The mitral valve is grossly normal. No evidence of mitral valve regurgitation. No evidence of mitral stenosis. 4. The aortic valve is abnormal. There is mild calcification of the aortic valve. Aortic valve regurgitation is not visualized. Mild aortic valve sclerosis is present, with no evidence of aortic valve stenosis. 5. The inferior vena cava is normal in size with greater than 50% respiratory variability, suggesting right atrial pressure of 3 mmHg. --11/02/20: Pt seen today she is eating ,d with her about her ct scan which is pending. and d/c plan probably tomorrow morning also that her effusion related to hypoalbuminemia due to ESRD.  Essential hypertension On Carvedilol 12.5 mg p.o. BID, irbesartan 300 mg p.o. daily, amlodipine 10 mg p.o. daily at home; discontinued by nephrology 11/16; may be contributing to persistent volume overload --Continue to monitor blood pressure closely  --11/01/2020 Blood pressure (!) 144/75, pulse 82, temperature 98.1 F (36.7 C), temperature source Oral, resp. rate 20, height 5\' 2"  (1.575 m), weight 50.1 kg, SpO2 100 %. BP control is excellent and we will follow no change in therapy.   --11/02/20 Blood pressure (!) 144/75, pulse 82, temperature 98.1 F (36.7 C), temperature source Oral, resp. rate 20, height 5\' 2"  (1.575 m), weight 50.1 kg, SpO2 100 %. BP  HLD: Continue  Crestor 10 mg p.o. daily  Anemia of chronic kidney disease Hemoglobin 8.5, stable --repeat CBC in the am. --h/h today is 10.    DVT prophylaxis: SCDs Code Status: Full code Family Communication: No family present at bedside this morning.  Disposition Plan:  Status is: Inpatient  Remains inpatient appropriate because:Unsafe d/c plan and Inpatient level of care appropriate due to severity of illness   Dispo: The patient is from: Home  Anticipated d/c is to: Home  Anticipated d/c date is: 1 day  Patient currently is not medically stable to d/c.  Plan repeat thoracentesis by IR today, HD tomorrow, repeat walk test on 2 L following HD tomorrow, if no desaturations likely DC home with home health   Consultants:   Nephrology  Interventional radiology  Procedures:  Thoracentesis, left 10/24/2020; 1L removed Thoracentesis, right 10/25/2020; 700 mL removed   Subjective/Overview: Pt seen today she is alert oriented and cooperative. Vitals are stable. Labs are stable. This is day 11 and pt is still on oxygen. She does have h/o copd but  Is not on oxygen at home.I suspect this is kidney or hypoalbuminemia related and not chf.    Pt is alert awake and oriented , eating lunch, asking about repeat thoracentesis. D/w pt that it is likely from her low serum albumin and I was considering ct chest and then decide on d/c plan.  Objective: Vitals:   11/01/20 2331 11/02/20 0500 11/02/20 0519 11/02/20 1050  BP: (!) 154/74  (!) 150/74 (!) 144/75  Pulse:   82   Resp: 19  20 20   Temp: 98.3 F (36.8 C)  98 F (36.7 C) 98.1 F (36.7 C)  TempSrc: Oral  Oral Oral  SpO2: 100%  100% 100%  Weight:  50.1 kg    Height:       SpO2: 100 % O2 Flow Rate (L/min): 2 L/min  Intake/Output Summary (Last 24 hours) at 11/02/2020 1453 Last data filed at 11/01/2020 1800 Gross per 24 hour  Intake 240 ml  Output --  Net 240 ml   Filed Weights    11/01/20 0710 11/01/20 1115 11/02/20 0500  Weight: 52.6 kg 50.3 kg 50.1 kg    Examination: Blood pressure (!) 144/75, pulse 82, temperature 98.1 F (36.7 C), temperature source Oral, resp. rate 20, height 5\' 2"  (1.575 m), weight 50.1 kg, SpO2 100 %. General exam: Appears calm and comfortable  HEENT:EOMI, perrl  Respiratory system: Clear to auscultation. Respiratory effort normal. Cardiovascular system: S1 & S2 heard, RRR. No JVD, murmurs, rubs, gallops or clicks. No pedal edema. Gastrointestinal system: Abdomen is nondistended, soft and nontender. No organomegaly or masses felt. Normal bowel sounds heard. Central nervous system: Alert and oriented.CN grossly intact No focal neurological deficits. Extremities: pt moving all 4 ext and ambulating. Skin: No rashes, lesions or ulcers Psychiatry: Judgement and insight appear normal. Mood & affect appropriate.   Data Reviewed: I have personally reviewed following labs and imaging studies  Labs  No results for input(s): CKTOTAL, CKMB, TROPONINI in the last 72 hours. Lab Results  Component Value Date   WBC 15.3 (H) 11/02/2020   HGB 10.0 (L) 11/02/2020   HCT 31.0 (L) 11/02/2020   MCV 95.7 11/02/2020   PLT 226 11/02/2020    Recent Labs  Lab 11/02/20 1139  NA 132*  K 3.9  CL 97*  CO2 24  BUN 20  CREATININE 5.13*  CALCIUM 9.4  PROT 5.8*  BILITOT 0.4  ALKPHOS 77  ALT 17  AST 27  GLUCOSE 155*   Lab Results  Component Value Date   CHOL 112 06/29/2019   HDL 32.30 (L) 06/29/2019   LDLCALC 45 06/29/2019   TRIG 172.0 (H) 06/29/2019   Lab Results  Component Value Date   DDIMER 2.66 (H) 04/09/2020   Invalid input(s): POCBNP  echocardiogram   Radiology Studies: DG Chest 1 View Result Date: 10/31/2020 CLINICAL DATA:  Status post thoracentesis EXAM: CHEST  1 VIEW COMPARISON:  July 30, 2020. FINDINGS: Left pleural effusion smaller following thoracentesis. Small pleural effusion on each side. No pneumothorax. There is slight  bibasilar atelectasis. Heart size and pulmonary vascular normal. No adenopathy. Central catheter tip is at the cavoatrial junction. There is aortic atherosclerosis. No bone lesions. IMPRESSION: Small pleural effusion currently on each side with mild bibasilar atelectasis. No pneumothorax. No consolidation. Stable cardiac silhouette. Aortic Atherosclerosis (ICD10-I70.0). Electronically Signed   By: Lowella Grip III M.D.   On: 10/31/2020 12:21   IR THORACENTESIS ASP PLEURAL SPACE W/IMG GUIDE Result Date: 10/31/2020 INDICATION: Patient with end-stage renal disease, bilateral pleural effusions. Request is made for therapeutic thoracentesis. EXAM: ULTRASOUND GUIDED THERAPEUTIC RIGHT THORACENTESIS MEDICATIONS: 10 mL 1% lidocaine COMPLICATIONS: None immediate. PROCEDURE: An ultrasound guided thoracentesis was thoroughly discussed with the patient and questions answered. The benefits, risks, alternatives and complications were also discussed. The patient understands and wishes to proceed with the procedure. Written consent was obtained. Ultrasound was performed to localize and mark an adequate pocket of fluid in the right chest. The area was then prepped and draped in the normal sterile fashion. 1% Lidocaine was used for local anesthesia. Under ultrasound guidance a 6 Fr Safe-T-Centesis catheter was introduced. Thoracentesis was performed. The catheter was removed and a dressing applied. FINDINGS: A total of approximately 650 mL of amber fluid was removed. Samples were sent to the laboratory as requested by the clinical team. IMPRESSION: Successful ultrasound guided therapeutic right thoracentesis yielding 650 mL of pleural fluid. Read by: Brynda Greathouse PA-C Electronically Signed   By: Jerilynn Mages.  Shick M.D.   On: 10/31/2020 16:13       Anti-infectives (From admission, onward)   None       Continuous Infusions:   LOS: 10 days    Para Skeans, MD [facility] Pager [phone] How to contact the  [facility] Attending or Consulting provider [place] or covering provider during after hours [place], for this patient?    1. Check the care team in [facility] and look for a) attending/consulting [facility] provider listed and b) the [facility] team listed 2. Log into [email or web address] and use [place]'s universal password to access. If you do not have the password, please contact the hospital operator. 3. Locate the [facility] provider you are looking for under [facility] and page to a number that you can be directly reached. 4. If you still have difficulty reaching the provider, please page the [facility] (Director on Call) for the Hospitalists listed on amion for assistance. [email or web address] Password [facility] 11/02/2020, 2:53 PM

## 2020-11-02 ENCOUNTER — Inpatient Hospital Stay (HOSPITAL_COMMUNITY): Payer: Medicare Other

## 2020-11-02 LAB — CBC
HCT: 31 % — ABNORMAL LOW (ref 36.0–46.0)
Hemoglobin: 10 g/dL — ABNORMAL LOW (ref 12.0–15.0)
MCH: 30.9 pg (ref 26.0–34.0)
MCHC: 32.3 g/dL (ref 30.0–36.0)
MCV: 95.7 fL (ref 80.0–100.0)
Platelets: 226 10*3/uL (ref 150–400)
RBC: 3.24 MIL/uL — ABNORMAL LOW (ref 3.87–5.11)
RDW: 17.6 % — ABNORMAL HIGH (ref 11.5–15.5)
WBC: 15.3 10*3/uL — ABNORMAL HIGH (ref 4.0–10.5)
nRBC: 0 % (ref 0.0–0.2)

## 2020-11-02 LAB — COMPREHENSIVE METABOLIC PANEL
ALT: 17 U/L (ref 0–44)
AST: 27 U/L (ref 15–41)
Albumin: 1.9 g/dL — ABNORMAL LOW (ref 3.5–5.0)
Alkaline Phosphatase: 77 U/L (ref 38–126)
Anion gap: 11 (ref 5–15)
BUN: 20 mg/dL (ref 8–23)
CO2: 24 mmol/L (ref 22–32)
Calcium: 9.4 mg/dL (ref 8.9–10.3)
Chloride: 97 mmol/L — ABNORMAL LOW (ref 98–111)
Creatinine, Ser: 5.13 mg/dL — ABNORMAL HIGH (ref 0.44–1.00)
GFR, Estimated: 8 mL/min — ABNORMAL LOW (ref >60)
Glucose, Bld: 155 mg/dL — ABNORMAL HIGH (ref 70–99)
Potassium: 3.9 mmol/L (ref 3.5–5.1)
Sodium: 132 mmol/L — ABNORMAL LOW (ref 135–145)
Total Bilirubin: 0.4 mg/dL (ref 0.3–1.2)
Total Protein: 5.8 g/dL — ABNORMAL LOW (ref 6.5–8.1)

## 2020-11-02 MED ORDER — CHLORHEXIDINE GLUCONATE CLOTH 2 % EX PADS
6.0000 | MEDICATED_PAD | Freq: Every day | CUTANEOUS | Status: DC
  Administered 2020-11-06: 6 via TOPICAL

## 2020-11-02 MED ORDER — IOHEXOL 350 MG/ML SOLN
75.0000 mL | Freq: Once | INTRAVENOUS | Status: AC | PRN
  Administered 2020-11-02: 75 mL via INTRAVENOUS

## 2020-11-02 NOTE — Progress Notes (Signed)
Pt refuses stick at this time. Nurse Shirlean Mylar, RN states she will call the MD to clarify the CT angio, as the pt has unanswered questions regarding the ordered test.

## 2020-11-02 NOTE — Progress Notes (Signed)
[facility] Progress Note   Subjective:   Seen and examined at bedside. Still on 2L O2 via [place], ? Planned for thoracentesis again today. Denies any SOB at present, no CP, palpitations, dizziness or GI upset.   Objective Vitals:   11/01/20 2331 11/02/20 0500 11/02/20 0519 11/02/20 1050  BP: (!) 154/74  (!) 150/74 (!) 144/75  Pulse:   82   Resp: 19  20 20   Temp: 98.3 F (36.8 C)  98 F (36.7 C) 98.1 F (36.7 C)  TempSrc: Oral  Oral Oral  SpO2: 100%  100% 100%  Weight:  50.1 kg    Height:       Physical Exam General: frail appearing female in NAD Heart: RRR, no murmurs, rubs or gallops Lungs: Decreased breath sound RLL, CTA left lung Abdomen: Soft, non-tender, non-distended, +BS Extremities: + dependent edema in bilateral hips Dialysis Access:   R IJ TDC, LU AVF +b   Additional Objective Labs: Basic Metabolic Panel: Recent Labs  Lab 10/28/20 0115 10/30/20 0253 11/01/20 0123  NA 130* 133* 133*  K 4.5 3.9 3.6  CL 95* 96* 96*  CO2 23 27 27   GLUCOSE 86 82 91  BUN 37* 37* 34*  CREATININE 7.24* 6.31* 6.40*  CALCIUM 8.8* 8.8* 9.0   CBC: Recent Labs  Lab 10/28/20 0115 10/30/20 0253 11/01/20 0123  WBC 15.1* 14.4* 14.5*  HGB 8.5* 9.0* 9.2*  HCT 26.9* 28.8* 27.8*  MCV 95.7 93.8 96.2  PLT 297 247 248   Blood Culture    Component Value Date/Time   SDES FLUID 10/24/2020 1121   SPECREQUEST NONE 10/24/2020 1121   CULT  10/24/2020 1121    NO GROWTH 5 DAYS Performed at [facility], [place] [address] [place], [place] [address]    REPTSTATUS 10/29/2020 FINAL 10/24/2020 1121    Studies/Results: DG Chest 1 View  Result Date: 10/31/2020 CLINICAL DATA:  Status post thoracentesis EXAM: CHEST  1 VIEW COMPARISON:  July 30, 2020. FINDINGS: Left pleural effusion smaller following thoracentesis. Small pleural effusion on each side. No pneumothorax. There is slight bibasilar atelectasis. Heart size and pulmonary vascular normal. No adenopathy. Central  catheter tip is at the cavoatrial junction. There is aortic atherosclerosis. No bone lesions. IMPRESSION: Small pleural effusion currently on each side with mild bibasilar atelectasis. No pneumothorax. No consolidation. Stable cardiac silhouette. Aortic Atherosclerosis (ICD10-I70.0). Electronically Signed   By: Lowella Grip III M.D.   On: 10/31/2020 12:21   IR THORACENTESIS ASP PLEURAL SPACE W/IMG GUIDE  Result Date: 10/31/2020 INDICATION: Patient with end-stage renal disease, bilateral pleural effusions. Request is made for therapeutic thoracentesis. EXAM: ULTRASOUND GUIDED THERAPEUTIC RIGHT THORACENTESIS MEDICATIONS: 10 mL 1% lidocaine COMPLICATIONS: None immediate. PROCEDURE: An ultrasound guided thoracentesis was thoroughly discussed with the patient and questions answered. The benefits, risks, alternatives and complications were also discussed. The patient understands and wishes to proceed with the procedure. Written consent was obtained. Ultrasound was performed to localize and mark an adequate pocket of fluid in the right chest. The area was then prepped and draped in the normal sterile fashion. 1% Lidocaine was used for local anesthesia. Under ultrasound guidance a 6 Fr Safe-T-Centesis catheter was introduced. Thoracentesis was performed. The catheter was removed and a dressing applied. FINDINGS: A total of approximately 650 mL of amber fluid was removed. Samples were sent to the laboratory as requested by the clinical team. IMPRESSION: Successful ultrasound guided therapeutic right thoracentesis yielding 650 mL of pleural fluid. Read by: Brynda Greathouse PA-C Electronically  Signed   By: Jerilynn Mages.  Shick M.D.   On: 10/31/2020 16:13   Medications:   (feeding supplement) PROSource Plus  30 mL Oral BID BM   Chlorhexidine Gluconate Cloth  6 each Topical Q0600   darbepoetin (ARANESP) injection - DIALYSIS  100 mcg Intravenous Q Fri-HD   heparin  5,000 Units Subcutaneous Q8H   multivitamin  1 tablet  Oral QHS   rosuvastatin  10 mg Oral Daily    Dialysis Orders: [facility] MWF   4h 62kg 2/2.5 bath Hep none TDC  L AVF maturing (use> 11/25)  mircera100 q 2 weeks, (last 10/27)  venofer 100 q tx right now, 11/3 hgb 8.3 11/1 k 4  Assessment/Plan: 1. Hypoxia- reportedly has a baseline O2 req2L, down to 2L today. SOB/ resp failure appear to be all due to vol overload/ pulm edema and pleural effusions. Weights have come down about 12kgs since admission with HD and thoracentesis x2.  Appears plan is for another thoracentesis today. Is off all  BP meds.  Cont to lower vol w/ HD. Normal EF on Echo.  2. ESRD:normally MWF at [place] via [facility]- Has only been on HD since August. Next HD Sunday due to holiday schedule.  3. HTN: mult meds at home , w/ vol lowering BP's down and meds stopped.  4. Anemia of ESRD:on mircera and venofer at OP center- Hgb 9.2.Aranesp 178mcg q Friday.  5. Metabolic Bone Disease:Phos ok.  Corrected Ca elevated.  use low Ca bath. not on vitamin D or sensipar at OP unit. No binder on OP med list 6. Nutrition: Renal diet w/fluid restrictions.Alb low 1.6 - protein supplements  Anice Paganini, PA-C 11/02/2020, 11:23 AM  [facility] Pager: [phone]

## 2020-11-02 NOTE — Progress Notes (Signed)
Physical Therapy Treatment Patient Details Name: Cassandra Dyer MRN: [id number] DOB: 16-Jul-1941 Today's Date: 11/02/2020    History of Present Illness Pt is a 79 y/o female admitted secondary to worsening SOB. Found to have pulmonary edema. PMH includes HTN, R kidney carcinoma s/p nephrectomy, COPD on 2L, and ESRD on HD.     PT Comments    Pt is agreeable to walking with therapy as she hasn't been up yet today. Pt is currently mod I for bed mobility, supervision for transfers and ambulation 200 feet without AD. Pt on 2L O2 via [place], SaO2 >96%O2 with ambulation. D/c plans remain appropriate at this time. PT will continue to follow acutely.    Follow Up Recommendations  Home health PT;Supervision for mobility/OOB     Equipment Recommendations  Other (comment)       Precautions / Restrictions Precautions Precautions: Fall Precaution Comments: watch O2 sats    Mobility  Bed Mobility Overal bed mobility: Modified Independent Bed Mobility: Sit to Supine;Supine to Sit     Supine to sit: Modified independent (Device/Increase time) Sit to supine: Modified independent (Device/Increase time)   General bed mobility comments: no assist required   Transfers Overall transfer level: Needs assistance Equipment used: None Transfers: Sit to/from Stand Sit to Stand: Supervision         General transfer comment: supervision for safety   Ambulation/Gait Ambulation/Gait assistance: Supervision Gait Distance (Feet): 200 Feet Assistive device: None Gait Pattern/deviations: Step-through pattern;Decreased stride length;Drifts right/left Gait velocity: decreased Gait velocity interpretation: 1.31 - 2.62 ft/sec, indicative of limited community ambulator General Gait Details: slow, steady gait, no assist needed        Balance Overall balance assessment: Needs assistance Sitting-balance support: Feet supported Sitting balance-Leahy Scale: Good     Standing balance support: No upper  extremity supported Standing balance-Leahy Scale: Fair                              Cognition Arousal/Alertness: Awake/alert Behavior During Therapy: WFL for tasks assessed/performed Overall Cognitive Status: Within Functional Limits for tasks assessed                                           General Comments General comments (skin integrity, edema, etc.): Pt on 2L O2 via [place], SaO2 >96%O2 with ambulation, HR max noted 116 bpm       Pertinent Vitals/Pain Pain Assessment: No/denies pain Faces Pain Scale: No hurt           PT Goals (current goals can now be found in the care plan section) Acute Rehab PT Goals Patient Stated Goal: home PT Goal Formulation: With patient Time For Goal Achievement: 11/05/20 Potential to Achieve Goals: Good Progress towards PT goals: Progressing toward goals    Frequency    Min 3X/week      PT Plan Current plan remains appropriate       AM-PAC PT "6 Clicks" Mobility   Outcome Measure  Help needed turning from your back to your side while in a flat bed without using bedrails?: None Help needed moving from lying on your back to sitting on the side of a flat bed without using bedrails?: None Help needed moving to and from a bed to a chair (including a wheelchair)?: None Help needed standing up from a chair using your arms (e.g.,  wheelchair or bedside chair)?: None Help needed to walk in hospital room?: None Help needed climbing 3-5 steps with a railing? : A Lot 6 Click Score: 22    End of Session Equipment Utilized During Treatment: Gait belt Activity Tolerance: Patient tolerated treatment well Patient left: with call bell/phone within reach;in bed;with bed alarm set Nurse Communication: Mobility status PT Visit Diagnosis: Other abnormalities of gait and mobility (R26.89);Difficulty in walking, not elsewhere classified (R26.2)     Time: 5784-6962 PT Time Calculation (min) (ACUTE ONLY): 22  min  Charges:  $Therapeutic Exercise: 8-22 mins                     Dejan Angert B. Migdalia Dk PT, DPT Acute Rehabilitation Services Pager [phone] Office [phone]    [place] 11/02/2020, 3:55 PM

## 2020-11-03 DIAGNOSIS — J810 Acute pulmonary edema: Secondary | ICD-10-CM | POA: Diagnosis not present

## 2020-11-03 DIAGNOSIS — I10 Essential (primary) hypertension: Secondary | ICD-10-CM | POA: Diagnosis not present

## 2020-11-03 DIAGNOSIS — R918 Other nonspecific abnormal finding of lung field: Secondary | ICD-10-CM | POA: Diagnosis not present

## 2020-11-03 DIAGNOSIS — J9601 Acute respiratory failure with hypoxia: Secondary | ICD-10-CM | POA: Diagnosis not present

## 2020-11-03 LAB — RENAL FUNCTION PANEL
Albumin: 1.7 g/dL — ABNORMAL LOW (ref 3.5–5.0)
Anion gap: 13 (ref 5–15)
BUN: 35 mg/dL — ABNORMAL HIGH (ref 8–23)
CO2: 23 mmol/L (ref 22–32)
Calcium: 9 mg/dL (ref 8.9–10.3)
Chloride: 93 mmol/L — ABNORMAL LOW (ref 98–111)
Creatinine, Ser: 6.61 mg/dL — ABNORMAL HIGH (ref 0.44–1.00)
GFR, Estimated: 6 mL/min — ABNORMAL LOW (ref >60)
Glucose, Bld: 90 mg/dL (ref 70–99)
Phosphorus: 4.8 mg/dL — ABNORMAL HIGH (ref 2.5–4.6)
Potassium: 3.5 mmol/L (ref 3.5–5.1)
Sodium: 129 mmol/L — ABNORMAL LOW (ref 135–145)

## 2020-11-03 LAB — CBC
HCT: 28.5 % — ABNORMAL LOW (ref 36.0–46.0)
Hemoglobin: 9.2 g/dL — ABNORMAL LOW (ref 12.0–15.0)
MCH: 31.1 pg (ref 26.0–34.0)
MCHC: 32.3 g/dL (ref 30.0–36.0)
MCV: 96.3 fL (ref 80.0–100.0)
Platelets: 274 10*3/uL (ref 150–400)
RBC: 2.96 MIL/uL — ABNORMAL LOW (ref 3.87–5.11)
RDW: 17.1 % — ABNORMAL HIGH (ref 11.5–15.5)
WBC: 15.4 10*3/uL — ABNORMAL HIGH (ref 4.0–10.5)
nRBC: 0 % (ref 0.0–0.2)

## 2020-11-03 MED ORDER — HEPARIN SODIUM (PORCINE) 1000 UNIT/ML IJ SOLN
INTRAMUSCULAR | Status: AC
  Filled 2020-11-03: qty 4

## 2020-11-03 MED ORDER — CHLORHEXIDINE GLUCONATE CLOTH 2 % EX PADS
6.0000 | MEDICATED_PAD | Freq: Every day | CUTANEOUS | Status: DC
  Administered 2020-11-05 – 2020-11-06 (×2): 6 via TOPICAL

## 2020-11-03 NOTE — Progress Notes (Addendum)
Cassandra Dyer Progress Note   Subjective:   Patient seen on HD, tolerating procedure well. Reports she is tired, not much of an appetite lately. Breathing is at baseline. No CP, palpitations, dizziness.   Objective Vitals:   11/03/20 0735 11/03/20 0750 11/03/20 0805 11/03/20 0820  BP: (!) 128/52 (!) 126/53 (!) 134/56 (!) 121/57  Pulse:      Resp: (!) 23 (!) 28 (!) 28 (!) 23  Temp:      TempSrc:      SpO2:      Weight:      Height:       Physical Exam General: frail appearing female in NAD Heart: RRR, no murmurs, rubs or gallops Lungs: Decreased breath sound RLL, CTA left lung Abdomen: Soft, non-tender, non-distended, +BS Extremities: + dependent edema in bilateral hips Dialysis Access:   R IJ TDC, LU AVF maturing, + bruit  Additional Objective Labs: Basic Metabolic Panel: Recent Labs  Lab 11/01/20 0123 11/02/20 1139 11/03/20 0748  NA 133* 132* 129*  K 3.6 3.9 3.5  CL 96* 97* 93*  CO2 27 24 23   GLUCOSE 91 155* 90  BUN 34* 20 35*  CREATININE 6.40* 5.13* 6.61*  CALCIUM 9.0 9.4 9.0  PHOS  --   --  4.8*   Liver Function Tests: Recent Labs  Lab 11/02/20 1139 11/03/20 0748  AST 27  --   ALT 17  --   ALKPHOS 77  --   BILITOT 0.4  --   PROT 5.8*  --   ALBUMIN 1.9* 1.7*   CBC: Recent Labs  Lab 10/28/20 0115 10/28/20 0115 10/30/20 0253 10/30/20 0253 11/01/20 0123 11/02/20 1139 11/03/20 0748  WBC 15.1*   < > 14.4*   < > 14.5* 15.3* 15.4*  HGB 8.5*   < > 9.0*   < > 9.2* 10.0* 9.2*  HCT 26.9*   < > 28.8*   < > 27.8* 31.0* 28.5*  MCV 95.7  --  93.8  --  96.2 95.7 96.3  PLT 297   < > 247   < > 248 226 274   < > = values in this interval not displayed.   Blood Culture    Component Value Date/Time   SDES FLUID 10/24/2020 1121   SPECREQUEST NONE 10/24/2020 1121   CULT  10/24/2020 1121    NO GROWTH 5 DAYS Performed at [facility], [place] [address], [place], [place] [address]    REPTSTATUS 10/29/2020 FINAL 10/24/2020 1121     Studies/Results: CT ANGIO CHEST PE W OR WO CONTRAST  Result Date: 11/02/2020 CLINICAL DATA:  Respiratory failure EXAM: CT ANGIOGRAPHY CHEST WITH CONTRAST TECHNIQUE: Multidetector CT imaging of the chest was performed using the standard protocol during bolus administration of intravenous contrast. Multiplanar CT image reconstructions and MIPs were obtained to evaluate the vascular anatomy. CONTRAST:  23mL OMNIPAQUE IOHEXOL 350 MG/ML SOLN COMPARISON:  CT abdomen pelvis, 04/08/2020, 08/05/2017 FINDINGS: Cardiovascular: Satisfactory opacification of the pulmonary arteries. No evidence of pulmonary embolism. The left lower lobar pulmonary arteries are nearly occluded by a left hilar mass. Normal heart size. Left coronary artery calcifications. No pericardial effusion. Aortic atherosclerosis. Mediastinum/Nodes: Numerous enlarged left hilar and mediastinal lymph nodes, with a left hilar mass. Largest discretely measurable lymph nodes measure up to 2.1 x 1.4 cm (series 8, image 56). Thyroid gland, trachea, and esophagus demonstrate no significant findings. Lungs/Pleura: Mild centrilobular emphysema. Small to moderate, left greater than right pleural effusions and associated atelectasis or consolidation. There  is a left hilar mass measuring approximately 5.2 x 4.0 cm, which obstructs the left lower lobar bronchus near the origin (series 9, image 208). There is a subpleural nodule of the posterior left apex measuring 1.4 x 1.3 cm (series 8, image 31) and an additional subpleural nodule of the high medial apex measuring 0.9 x 0.6 cm (series 8, image 25). Upper Abdomen: Partially imaged left adrenal nodule, not obviously changed compared to prior examination dated 04/08/2020 measuring 1.5 x 1.0 cm (series 9, image 39). Musculoskeletal: No chest wall abnormality. No acute or significant osseous findings. Review of the MIP images confirms the above findings. IMPRESSION: 1. Negative examination for pulmonary embolism. 2.  There is a left hilar mass measuring approximately 5.2 x 4.0 cm, which obstructs the left lower lobar bronchus near the origin as well as the left lower lobar pulmonary arteries. 3. Subpleural nodules of the left pulmonary apex. 4. Numerous enlarged left hilar and mediastinal lymph nodes. 5. Constellation of findings is consistent primary lung malignancy and nodal and left-sided pulmonary metastatic disease. 6. Small to moderate, left greater than right pleural effusions and associated atelectasis or consolidation. Left pleural effusion is presumed malignant given presence of subpleural pulmonary nodules, there is no direct evidence of right sided malignant effusion or metastatic disease. 7. Partially imaged left adrenal nodule, not obviously changed compared to prior examinations and previously better characterized as a benign adenoma. Aortic Atherosclerosis (ICD10-I70.0) and Emphysema (ICD10-J43.9). Electronically Signed   By: Eddie Candle M.D.   On: 11/02/2020 19:11   Medications:  . (feeding supplement) PROSource Plus  30 mL Oral BID BM  . Chlorhexidine Gluconate Cloth  6 each Topical Q0600  . Chlorhexidine Gluconate Cloth  6 each Topical Q0600  . heparin  5,000 Units Subcutaneous Q8H  . multivitamin  1 tablet Oral QHS  . rosuvastatin  10 mg Oral Daily    Dialysis Orders: [facility] MWF  4h 62kg 2/2.5 bath Hep none TDC L AVF maturing (use>11/25) mircera100 q 2 weeks, (last 10/27)  venofer 100 q tx right now, 11/3 hgb 8.3 11/1 k 4  Assessment/Plan: 1. Hypoxia- reportedly has a baseline O2 req2L, is off O2 this AM. SOB/ resp failure appear to be all due to vol overload/ pulm edema and pleural effusions. Weights have come down about 12kgs since admission with HD and thoracentesis x2. Normal EF on echo. CTA showedleft hilar mass and concerning for malignancy, which explains recent weight loss. Will continue to titrate down volume with HD as tolerated.  2. ESRD:normally MWF at [facility] via [facility]- Has  only been on HD since August. On HD today and next HD will ne Tuesday, 11/23 due to holiday schedule.  3. HTN: mult meds at home , w/ vol lowering BP's down with volume removal and meds stopped.  4. Anemia of ESRD:on mircera and venofer at OP center- Hgb 9.2.Has been on Aranesp 135mcgq Friday- will stop at this time due to concern for malignancy.  5. Metabolic Bone Disease:Phos ok. Corrected Ca elevated. use low Ca bath. not on vitamin D or sensipar at OP unit. No binder on OP med list 6. Nutrition: Renal diet w/fluid restrictions.Alb low 1.6 - on protein supplements  Anice Paganini, PA-C 11/03/2020, 8:31 AM  [facility] Pager: [phone]  Addendum: Noted Dr. Agustina Caroli request to move next HD to 11/22 to prepare for EBUS/ENB on 11/23. Will plan for next HD tomorrow, 11/22, then next HD possibly Wednesday.

## 2020-11-03 NOTE — Consult Note (Signed)
NAME:  Cassandra Dyer, MRN:  [id number], DOB:  03-Mar-1941, LOS: 68 ADMISSION DATE:  10/21/2020, CONSULTATION DATE: 11/03/2020 REFERRING MD: Dr. Posey Pronto, TRH, CHIEF COMPLAINT: Left hilar mass, left upper lobe pulmonary nodule  Brief History   79 year old woman, renal cell carcinoma post nephrectomy 2018, end-stage renal disease on HD, COPD.  Admitted with dyspnea, volume overload and effusions.  CT shows left hilar mass, left upper lobe pulmonary nodules.  History of present illness   79 year old woman with a history of renal cell cancer post R nephrectomy, end-stage renal disease on HD since 07/2020, COPD.  She was admitted on 10/21/2020 with dyspnea after she missed dialysis the day prior.  She has been experiencing progressive weakness, has had weight loss .  Dialysis initiated, continued to require supplemental oxygen.  She had bilateral pleural effusions and underwent left thoracentesis 11/11, right thoracentesis on 11/12 and then another left thoracentesis on 11/18.  Pleural fluid from the initial tap was transudative.  I do not see any cytology from any of the procedures. She describes weight loss of about 40 pounds over the last 6 months (some of this fluid after starting HD).  She has had cough for over a month, sometimes with small amounts of blood mixed in with her sputum. She had continued oxygen need despite thoracenteses and volume removal with HD prompting a CT-PA on 11/20 which I have reviewed.  This shows a left hilar mass that impacts the left lower lobe bronchus as well as the left pulmonary artery, also left upper lobe subpleural nodules present.  PCCM consulted to evaluate the chest lesions.   Past Medical History   Past Medical History:  Diagnosis Date  . Anemia   . Hepatitis    hx of hep C - 10-20 years ago   . History of blood transfusion   . History of kidney cancer   . Hyperlipidemia   . Hypertension    not taken b/p med in 2-3 years md deceased never went to another  md     Significant Hospital Events     Consults:  IR Nephrology  Procedures:  L thora 11/11 > transudate, mixed inflammation R thora 11/12 L thora 11/18  Significant Diagnostic Tests:  CT-PA 11/20 >>   Micro Data:  L pleural fluid 11/11 >> negative  Antimicrobials:    Interim history/subjective:     Objective   Blood pressure (!) 127/58, pulse (!) 101, temperature 97.6 F (36.4 C), temperature source Oral, resp. rate (!) 22, height 5\' 2"  (1.575 m), weight 49.9 kg, SpO2 100 %.        Intake/Output Summary (Last 24 hours) at 11/03/2020 1523 Last data filed at 11/03/2020 1135 Gross per 24 hour  Intake --  Output 2042 ml  Net -2042 ml   Filed Weights   11/03/20 0500 11/03/20 0705 11/03/20 1135  Weight: 50.2 kg 52.8 kg 49.9 kg    Examination: General: Thin elderly woman, comfortable laying in bed HENT: Oropharynx clear, pupils equal Lungs: Clear bilaterally, no wheezing Cardiovascular: Distant, regular, no murmur, right tunneled HD catheter Abdomen: Soft, nondistended, positive bowel sounds Extremities: No significant edema, left upper extremity fistula with good thrill Neuro: Awake, alert, interacting appropriately, follows commands, normal strength  Resolved Hospital Problem list     Assessment & Plan:   L hilar mass, LUL pulmonary nodules with mediastinal LAD, all concerning for malignancy. Most consistent with primary lung cancer, consider also metastatic renal cell CA. I discussed with her the likelihood that  this is primary lung cancer.  She understands.  I also discussed possible strategies for diagnosis.  I believe that bronchoscopy with endobronchial ultrasound, possibly navigation to her left apical nodules if necessary, is the best strategy here.  Explained the benefits, risks.  She agrees to proceed.  I have set her up for EBUS/ENB on Tuesday 11/23.  Will place preop orders today.  She had hemodialysis today 11/21.  Her next scheduled HD would be  11/23.  Need to check to see if it is possible her to get her HD early, on 35/00 to avoid conflict and to have her tuned up for procedure.    Labs   CBC: Recent Labs  Lab 10/28/20 0115 10/30/20 0253 11/01/20 0123 11/02/20 1139 11/03/20 0748  WBC 15.1* 14.4* 14.5* 15.3* 15.4*  HGB 8.5* 9.0* 9.2* 10.0* 9.2*  HCT 26.9* 28.8* 27.8* 31.0* 28.5*  MCV 95.7 93.8 96.2 95.7 96.3  PLT 297 247 248 226 938    Basic Metabolic Panel: Recent Labs  Lab 10/28/20 0115 10/30/20 0253 11/01/20 0123 11/02/20 1139 11/03/20 0748  NA 130* 133* 133* 132* 129*  K 4.5 3.9 3.6 3.9 3.5  CL 95* 96* 96* 97* 93*  CO2 23 27 27 24 23   GLUCOSE 86 82 91 155* 90  BUN 37* 37* 34* 20 35*  CREATININE 7.24* 6.31* 6.40* 5.13* 6.61*  CALCIUM 8.8* 8.8* 9.0 9.4 9.0  PHOS  --   --   --   --  4.8*   GFR: Estimated Creatinine Clearance: 5.6 mL/min (A) (by C-G formula based on SCr of 6.61 mg/dL (H)). Recent Labs  Lab 10/30/20 0253 11/01/20 0123 11/02/20 1139 11/03/20 0748  WBC 14.4* 14.5* 15.3* 15.4*    Liver Function Tests: Recent Labs  Lab 11/02/20 1139 11/03/20 0748  AST 27  --   ALT 17  --   ALKPHOS 77  --   BILITOT 0.4  --   PROT 5.8*  --   ALBUMIN 1.9* 1.7*   No results for input(s): LIPASE, AMYLASE in the last 168 hours. No results for input(s): AMMONIA in the last 168 hours.  ABG No results found for: PHART, PCO2ART, PO2ART, HCO3, TCO2, ACIDBASEDEF, O2SAT   Coagulation Profile: No results for input(s): INR, PROTIME in the last 168 hours.  Cardiac Enzymes: No results for input(s): CKTOTAL, CKMB, CKMBINDEX, TROPONINI in the last 168 hours.  HbA1C: Hgb A1c MFr Bld  Date/Time Value Ref Range Status  04/08/2020 04:40 PM 5.6 4.8 - 5.6 % Final    Comment:    (NOTE) Pre diabetes:          5.7%-6.4% Diabetes:              >6.4% Glycemic control for   <7.0% adults with diabetes   06/29/2019 04:22 PM 5.8 4.6 - 6.5 % Final    Comment:    Glycemic Control Guidelines for People with  Diabetes:Non Diabetic:  <6%Goal of Therapy: <7%Additional Action Suggested:  >8%     CBG: No results for input(s): GLUCAP in the last 168 hours.  Review of Systems:   Weight loss 40 pounds last 6 months Cough over the last month, sometimes with some blood mixed in with mucus  Past Medical History  She,  has a past medical history of Anemia, Hepatitis, History of blood transfusion, History of kidney cancer, Hyperlipidemia, and Hypertension.   Surgical History    Past Surgical History:  Procedure Laterality Date  . AV FISTULA PLACEMENT Left 08/07/2020  Procedure: LEFT ARM ARTERIOVENOUS (AV) FISTULA CREATION;  Surgeon: [name], MD;  Location: [facility];  Service: Vascular;  Laterality: Left;  . CATARACT EXTRACTION Bilateral    11/30 left /12/9 right  . IR FLUORO GUIDE CV LINE RIGHT  07/27/2020  . IR THORACENTESIS ASP PLEURAL SPACE W/IMG GUIDE  10/24/2020  . IR THORACENTESIS ASP PLEURAL SPACE W/IMG GUIDE  10/25/2020  . IR THORACENTESIS ASP PLEURAL SPACE W/IMG GUIDE  10/31/2020  . IR US GUIDE VASC ACCESS RIGHT  07/27/2020  . ROBOT ASSISTED LAPAROSCOPIC NEPHRECTOMY Right 01/13/2017   Procedure: XI ROBOTIC ASSISTED LAPAROSCOPIC RADICAL NEPHRECTOMY WITH LYSIS OF ADHESION;  Surgeon: Alexis Frock, MD;  Location: [facility];  Service: Urology;  Laterality: Right;     Social History   reports that she quit smoking about 19 years ago. Her smoking use included cigarettes. She has a 15.00 pack-year smoking history. She has never used smokeless tobacco. She reports current alcohol use. She reports that she does not use drugs.   Family History   Her family history includes Breast cancer in her sister; Hypertension in her mother; Pancreatic cancer in her father.   Allergies No Known Allergies   Home Medications  Prior to Admission medications   Medication Sig Start Date End Date Taking? Authorizing Provider  amLODipine (NORVASC) 10 MG tablet Take 1 tablet (10 mg total) by mouth daily. 04/19/20   Yes Biagio Borg, MD  ascorbic acid (VITAMIN C) 500 MG tablet Take 500 mg by mouth 2 (two) times daily.  08/31/20  Yes [provider]  Black Currant Seed Oil 500 MG CAPS Take 500 mg by mouth every morning.    Yes [provider]  Calcium Carbonate-Vit D-Min (CALCIUM 1200 PO) Take 1,200 mg by mouth every morning.    Yes [provider]  carvedilol (COREG) 12.5 MG tablet Take 1 tablet (12.5 mg total) by mouth 2 (two) times daily with a meal. 08/08/20  Yes Georgette Shell, MD  Cholecalciferol (VITAMIN D3) 50 MCG (2000 UT) capsule Take 2,000 Units by mouth every morning. 08/31/20  Yes [provider]  Garlic 6063 MG CAPS Take 2,000 mg by mouth daily.    Yes [provider]  Multiple Vitamin (MULTIVITAMIN WITH MINERALS) TABS tablet Take 1 tablet by mouth every morning.    Yes [provider]  Omega-3 Fatty Acids (FISH OIL PO) Take 1 capsule by mouth every morning.    Yes [provider]  ondansetron (ZOFRAN) 4 MG tablet Take 1 tablet (4 mg total) by mouth every 8 (eight) hours as needed for nausea or vomiting. 04/19/20  Yes Biagio Borg, MD  OVER THE COUNTER MEDICATION Take 1 capsule by mouth daily. Mega juice   Yes [provider]  polyethylene glycol (MIRALAX / GLYCOLAX) 17 g packet Take 17 g by mouth daily as needed for mild constipation. 09/12/20  Yes Domenic Polite, MD  rosuvastatin (CRESTOR) 10 MG tablet Take 1 tablet (10 mg total) by mouth daily. 09/26/20  Yes Biagio Borg, MD  benzonatate (TESSALON) 200 MG capsule Take 1 capsule (200 mg total) by mouth 3 (three) times daily as needed. Patient not taking: Reported on 10/10/2020 07/22/20   Marrian Salvage, FNP  metoprolol succinate (TOPROL-XL) 100 MG 24 hr tablet Take 100 mg by mouth daily. 10/15/20   [provider]  senna-docusate (SENOKOT-S) 8.6-50 MG tablet Take 1 tablet by mouth daily as needed for mild constipation. Patient not taking: Reported on  10/21/2020 09/12/20  Domenic Polite, MD  telmisartan (MICARDIS) 80 MG tablet Take 80 mg by mouth daily. 10/15/20   [provider]     Critical care time: NA     Baltazar Apo, MD, PhD 11/03/2020, 4:24 PM [facility] [phone] or if no answer [phone]

## 2020-11-03 NOTE — Progress Notes (Addendum)
PROGRESS NOTE    Cassandra Dyer  GNF:[id number] DOB: 1941-02-05 DOA: 10/21/2020  PCP: Biagio Borg, MD    Brief Narrative:  Cassandra Dyer is a 79 year old female with past medical history significant for ESRD on HD, history of renal cell carcinoma right kidney status post nephrectomy, essential hypertension, anemia of chronic kidney disease who presented to the ED with complaint of shortness of breath, weakness.  Missed hemodialysis on Monday.  Baseline oxygen requirement of 2 L but now up to 4 L to maintain adequate oxygenation.  In the ED, chest x-ray notable for bilateral pleural effusions and interstitial edema.  EDP consulted hospitalist group for admission secondary to acute on chronic hypoxic respiratory failure secondary to volume overload/pulmonary edema/pleural effusion.   Assessment & Plan: Acute on chronic hypoxic respiratory failure 2/2 pulmonary edema and bilateral pleural effusions COPD Patient presenting from home with progressive shortness of breath, missed HD session.  Chest x-ray on admission notable for bilateral pleural effusions, interstitial edema from volume overload.  Baseline oxygen 2 L per nasal cannula, Continues on 4 L per nasal cannula.  Underwent ultrasound-guided left thoracentesis with 1 L fluid removed on 11/11 and right thoracentesis on 11/12 with 700 mL removed.  Lights criteria consistent with transudative effusion. TTE with LVEF 84-69%, grade 2 diastolic dysfunction, mild aortic valve sclerosis without stenosis, normal IVC. --Fluid restriction 1.2 L daily --net negative 11.2L since admission --Continue supplemental oxygen, titrate to maintain SPO2 greater than 88%; currently on 2L [place] at rest; ambulated better this morning without any significant hypoxia --Continue further volume management per nephrology; next HD tomorrow --Repeat thoracentesis today, IR consulted. --11/01/2020: pt s/p two thoracentesis and d/w her about d/c today pt states she is  still little sob and is not on oxygen at home. Pt echo showed ef of 60%: 1. Left ventricular ejection fraction, by estimation, is 60 to 65%. The left ventricle has normal function. The left ventricle has no regional wall motion abnormalities. Left ventricular diastolic parameters are consistent with Grade II diastolic dysfunction (pseudonormalization). 2. Right ventricular systolic function is normal. The right ventricular size is mildly enlarged. There is moderately elevated pulmonary artery systolic pressure. The estimated right ventricular systolic pressure is 62.9 mmHg. 3. The mitral valve is grossly normal. No evidence of mitral valve regurgitation. No evidence of mitral stenosis. 4. The aortic valve is abnormal. There is mild calcification of the aortic valve. Aortic valve regurgitation is not visualized. Mild aortic valve sclerosis is present, with no evidence of aortic valve stenosis. 5. The inferior vena cava is normal in size with greater than 50% respiratory variability, suggesting right atrial pressure of 3 mmHg. --11/02/20: Pt seen today she is eating ,d with her about her ct scan which is pending. and d/c plan probably tomorrow morning also that her effusion related to hypoalbuminemia due to ESRD. --11/03/20 Pt seen today and ct scan is negative for PE but has left lung mass which is concerning for primary lung malignancy with mets. D/W pt about unfortunate finding and its contributing to her SOB and pulmonary consult for further options.greatly appreciate Consult management and care plan from pulmonology. Pt is to have bronchoscopy and EBU on Tuesday.    Essential hypertension On Carvedilol 12.5 mg p.o. BID, irbesartan 300 mg p.o. daily, amlodipine 10 mg p.o. daily at home; discontinued by nephrology 11/16; may be contributing to persistent volume overload --Continue to monitor blood pressure closely  --11/01/2020 Blood pressure (!) 127/58, pulse (!) 101, temperature 97.6 F  (  36.4 C), temperature source Oral, resp. rate (!) 22, height 5\' 2"  (1.575 m), weight 49.9 kg, SpO2 100 %. BP control is excellent and we will follow no change in therapy.   --11/02/20 Blood pressure (!) 127/58, pulse (!) 101, temperature 97.6 F (36.4 C), temperature source Oral, resp. rate (!) 22, height 5\' 2"  (1.575 m), weight 49.9 kg, SpO2 100 %.  --11/03/20 Blood pressure (!) 127/58, pulse (!) 101, temperature 97.6 F (36.4 C), temperature source Oral, resp. rate (!) 22, height 5\' 2"  (1.575 m), weight 49.9 kg, SpO2 100 %. No change in regimen.   HLD: Continue Crestor 10 mg p.o. daily  Anemia of chronic kidney disease Hemoglobin 8.5, stable --repeat CBC in the am. --h/h today is 12. --h/h is stable today 9.2   DVT prophylaxis: SCDs Code Status: Full code Family Communication: No family present at bedside this morning.  Disposition Plan:  Status is: Inpatient  Remains inpatient appropriate because:Unsafe d/c plan and Inpatient level of care appropriate due to severity of illness   Dispo: The patient is from: Home  Anticipated d/c is to: Home  Anticipated d/c date is: 1 day  Patient currently is not medically stable to d/c.  Plan repeat thoracentesis by IR today, HD tomorrow, repeat walk test on 2 L following HD tomorrow, if no desaturations likely DC home with home health   Consultants:   Nephrology  Interventional radiology  Procedures:  Thoracentesis, left 10/24/2020; 1L removed Thoracentesis, right 10/25/2020; 700 mL removed   Subjective/Overview: Pt seen today she is alert oriented and cooperative. Vitals are stable. Labs are stable. This is day 11 and pt is still on oxygen. She does have h/o copd but  Is not on oxygen at home.I suspect this is kidney or hypoalbuminemia related and not chf.    Pt is alert awake and oriented , eating lunch, asking about repeat thoracentesis. D/w pt that it is likely from her  low serum albumin and I was considering ct chest and then decide on d/c plan.   Pt is very sad and crying that we found the lung mass and going to think about calling her sister as she has no children. Pulmonary consult for eval of suspicious mass.   Objective: Vitals:   11/03/20 1120 11/03/20 1130 11/03/20 1135 11/03/20 1237  BP: (!) 89/52 (!) 86/48  (!) 127/58  Pulse:    (!) 101  Resp: (!) 28 (!) 29  (!) 22  Temp:   97.6 F (36.4 C)   TempSrc:   Oral   SpO2:    100%  Weight:   49.9 kg   Height:       SpO2: 100 % O2 Flow Rate (L/min): 2 L/min  Intake/Output Summary (Last 24 hours) at 11/03/2020 1620 Last data filed at 11/03/2020 1135 Gross per 24 hour  Intake --  Output 2042 ml  Net -2042 ml   Filed Weights   11/03/20 0500 11/03/20 0705 11/03/20 1135  Weight: 50.2 kg 52.8 kg 49.9 kg    Examination: Blood pressure (!) 127/58, pulse (!) 101, temperature 97.6 F (36.4 C), temperature source Oral, resp. rate (!) 22, height 5\' 2"  (1.575 m), weight 49.9 kg, SpO2 100 %. General exam: Appears calm and comfortable  HEENT:EOMI, perrl  Respiratory system: Clear to auscultation. Respiratory effort normal. Cardiovascular system: S1 & S2 heard, RRR. No JVD, murmurs, rubs, gallops or clicks. No pedal edema. Gastrointestinal system: Abdomen is nondistended, soft and nontender. No organomegaly or masses felt. Normal bowel sounds  heard. Central nervous system: Alert and oriented.CN grossly intact No focal neurological deficits. Extremities: pt moving all 4 ext and ambulating. Skin: No rashes, lesions or ulcers Psychiatry: Judgement and insight appear normal. Mood & affect appropriate.   Data Reviewed: I have personally reviewed following labs and imaging studies  Labs  No results for input(s): CKTOTAL, CKMB, TROPONINI in the last 72 hours. Lab Results  Component Value Date   WBC 15.4 (H) 11/03/2020   HGB 9.2 (L) 11/03/2020   HCT 28.5 (L) 11/03/2020   MCV 96.3 11/03/2020   PLT  274 11/03/2020    Recent Labs  Lab 11/02/20 1139 11/02/20 1139 11/03/20 0748  NA 132*   < > 129*  K 3.9   < > 3.5  CL 97*   < > 93*  CO2 24   < > 23  BUN 20   < > 35*  CREATININE 5.13*   < > 6.61*  CALCIUM 9.4   < > 9.0  PROT 5.8*  --   --   BILITOT 0.4  --   --   ALKPHOS 77  --   --   ALT 17  --   --   AST 27  --   --   GLUCOSE 155*   < > 90   < > = values in this interval not displayed.   Lab Results  Component Value Date   CHOL 112 06/29/2019   HDL 32.30 (L) 06/29/2019   LDLCALC 45 06/29/2019   TRIG 172.0 (H) 06/29/2019   Lab Results  Component Value Date   DDIMER 2.66 (H) 04/09/2020   Invalid input(s): POCBNP  echocardiogram   Radiology Studies: DG Chest 1 View Result Date: 10/31/2020 CLINICAL DATA:  Status post thoracentesis EXAM: CHEST  1 VIEW COMPARISON:  July 30, 2020. FINDINGS: Left pleural effusion smaller following thoracentesis. Small pleural effusion on each side. No pneumothorax. There is slight bibasilar atelectasis. Heart size and pulmonary vascular normal. No adenopathy. Central catheter tip is at the cavoatrial junction. There is aortic atherosclerosis. No bone lesions. IMPRESSION: Small pleural effusion currently on each side with mild bibasilar atelectasis. No pneumothorax. No consolidation. Stable cardiac silhouette. Aortic Atherosclerosis (ICD10-I70.0). Electronically Signed   By: Lowella Grip III M.D.   On: 10/31/2020 12:21   IR THORACENTESIS ASP PLEURAL SPACE W/IMG GUIDE Result Date: 10/31/2020 INDICATION: Patient with end-stage renal disease, bilateral pleural effusions. Request is made for therapeutic thoracentesis. EXAM: ULTRASOUND GUIDED THERAPEUTIC RIGHT THORACENTESIS MEDICATIONS: 10 mL 1% lidocaine COMPLICATIONS: None immediate. PROCEDURE: An ultrasound guided thoracentesis was thoroughly discussed with the patient and questions answered. The benefits, risks, alternatives and complications were also discussed. The patient understands  and wishes to proceed with the procedure. Written consent was obtained. Ultrasound was performed to localize and mark an adequate pocket of fluid in the right chest. The area was then prepped and draped in the normal sterile fashion. 1% Lidocaine was used for local anesthesia. Under ultrasound guidance a 6 Fr Safe-T-Centesis catheter was introduced. Thoracentesis was performed. The catheter was removed and a dressing applied. FINDINGS: A total of approximately 650 mL of amber fluid was removed. Samples were sent to the laboratory as requested by the clinical team. IMPRESSION: Successful ultrasound guided therapeutic right thoracentesis yielding 650 mL of pleural fluid. Read by: Brynda Greathouse PA-C Electronically Signed   By: Jerilynn Mages.  Shick M.D.   On: 10/31/2020 16:13    Anti-infectives (From admission, onward)   None       Continuous Infusions:  LOS: 11 days    Para Skeans, MD [facility] Pager [phone] How to contact the [facility] Attending or Consulting provider [place] or covering provider during after hours [place], for this patient?    1. Check the care team in [facility] and look for a) attending/consulting [facility] provider listed and b) the [facility] team listed 2. Log into [email or web address] and use [place]'s universal password to access. If you do not have the password, please contact the hospital operator. 3. Locate the [facility] provider you are looking for under [facility] and page to a number that you can be directly reached. 4. If you still have difficulty reaching the provider, please page the [facility] (Director on Call) for the Hospitalists listed on amion for assistance. [email or web address] Password [email or web address] 11/03/2020, 4:20 PM

## 2020-11-04 ENCOUNTER — Other Ambulatory Visit: Payer: Self-pay | Admitting: *Deleted

## 2020-11-04 DIAGNOSIS — J810 Acute pulmonary edema: Secondary | ICD-10-CM | POA: Diagnosis not present

## 2020-11-04 DIAGNOSIS — R918 Other nonspecific abnormal finding of lung field: Secondary | ICD-10-CM | POA: Diagnosis not present

## 2020-11-04 LAB — CBC
HCT: 32.7 % — ABNORMAL LOW (ref 36.0–46.0)
Hemoglobin: 10.4 g/dL — ABNORMAL LOW (ref 12.0–15.0)
MCH: 30 pg (ref 26.0–34.0)
MCHC: 31.8 g/dL (ref 30.0–36.0)
MCV: 94.2 fL (ref 80.0–100.0)
Platelets: 281 10*3/uL (ref 150–400)
RBC: 3.47 MIL/uL — ABNORMAL LOW (ref 3.87–5.11)
RDW: 17.1 % — ABNORMAL HIGH (ref 11.5–15.5)
WBC: 16.3 10*3/uL — ABNORMAL HIGH (ref 4.0–10.5)
nRBC: 0 % (ref 0.0–0.2)

## 2020-11-04 LAB — BASIC METABOLIC PANEL
Anion gap: 13 (ref 5–15)
BUN: 22 mg/dL (ref 8–23)
CO2: 25 mmol/L (ref 22–32)
Calcium: 9.8 mg/dL (ref 8.9–10.3)
Chloride: 96 mmol/L — ABNORMAL LOW (ref 98–111)
Creatinine, Ser: 4.33 mg/dL — ABNORMAL HIGH (ref 0.44–1.00)
GFR, Estimated: 10 mL/min — ABNORMAL LOW (ref >60)
Glucose, Bld: 106 mg/dL — ABNORMAL HIGH (ref 70–99)
Potassium: 3.8 mmol/L (ref 3.5–5.1)
Sodium: 134 mmol/L — ABNORMAL LOW (ref 135–145)

## 2020-11-04 LAB — PROTIME-INR
INR: 1.2 (ref 0.8–1.2)
Prothrombin Time: 15 seconds (ref 11.4–15.2)

## 2020-11-04 LAB — APTT: aPTT: 39 seconds — ABNORMAL HIGH (ref 24–36)

## 2020-11-04 LAB — MAGNESIUM: Magnesium: 1.9 mg/dL (ref 1.7–2.4)

## 2020-11-04 MED ORDER — HEPARIN SODIUM (PORCINE) 1000 UNIT/ML IJ SOLN
INTRAMUSCULAR | Status: AC
  Filled 2020-11-04: qty 4

## 2020-11-04 NOTE — H&P (View-Only) (Signed)
NAME:  Cassandra Dyer, MRN:  [id number], DOB:  [birth year], LOS: 8 ADMISSION DATE:  10/21/2020, CONSULTATION DATE: 11/03/2020 REFERRING MD: Dr. Posey Pronto, TR[facility], CHIEF COMPLAINT: Left hilar mass, left upper lobe pulmonary nodule  Brief History   79 year old woman, renal cell carcinoma post nephrectomy 2018, end-stage renal disease on HD, COPD.  Admitted with dyspnea, volume overload and effusions.  CT shows left hilar mass, left upper lobe pulmonary nodules.  History of present illness   79 year old woman with a history of renal cell cancer post R nephrectomy, end-stage renal disease on HD since 07/2020, COPD.  She was admitted on 10/21/2020 with dyspnea after she missed dialysis the day prior.  She has been experiencing progressive weakness, has had weight loss .  Dialysis initiated, continued to require supplemental oxygen.  She had bilateral pleural effusions and underwent left thoracentesis 11/11, right thoracentesis on 11/12 and then another left thoracentesis on 11/18.  Pleural fluid from the initial tap was transudative.  I do not see any cytology from any of the procedures. She describes weight loss of about 40 pounds over the last 6 months (some of this fluid after starting HD).  She has had cough for over a month, sometimes with small amounts of blood mixed in with her sputum. She had continued oxygen need despite thoracenteses and volume removal with HD prompting a CT-PA on 11/20 which I have reviewed.  This shows a left hilar mass that impacts the left lower lobe bronchus as well as the left pulmonary artery, also left upper lobe subpleural nodules present.  PCCM consulted to evaluate the chest lesions.   Past Medical History   Past Medical History:  Diagnosis Date  . Anemia   . Hepatitis    hx of hep C - 10-20 years ago   . History of blood transfusion   . History of kidney cancer   . Hyperlipidemia   . Hypertension    not taken b/p med in 2-3 years md deceased never went to another  md     [facility] Events     Consults:  IR Nephrology  Procedures:  L thora 11/11 > transudate, mixed inflammation R thora 11/12 L thora 11/18  Significant Diagnostic Tests:  CT-PA 11/20 >>   Micro Data:  L pleural fluid 11/11 >> negative  Antimicrobials:    Interim history/subjective:  NAEON. Breathing ok. Says plan for HD today.   Objective   Blood pressure (!) 159/63, pulse 97, temperature 98.4 F (36.9 C), temperature source Oral, resp. rate 19, height 5\' 2"  (1.575 m), weight 49.9 kg, SpO2 100 %.        Intake/Output Summary (Last 24 hours) at 11/04/2020 0933 Last data filed at 11/03/2020 1135 Gross per 24 hour  Intake --  Output 2042 ml  Net -2042 ml   Filed Weights   11/03/20 0500 11/03/20 0705 11/03/20 1135  Weight: 50.2 kg 52.8 kg 49.9 kg    Examination: General: Thin elderly woman, comfortable laying in bed HENT: Oropharynx clear, pupils equal Lungs: Clear bilaterally, no wheezing Cardiovascular: Distant, regular, no murmur, right tunneled HD catheter Abdomen: Soft, nondistended, positive bowel sounds Extremities: No significant edema, left upper extremity fistula with good thrill Neuro: Awake, alert, interacting appropriately, follows commands, normal strength  Resolved Hospital Problem list     Assessment & Plan:   L hilar mass, LUL pulmonary nodules with mediastinal LAD, all concerning for malignancy. Most consistent with primary lung cancer, consider also metastatic renal cell CA. Planned EBUS/Nav  w/ Dr. Lamonte Sakai 11/23.  -plan to see 11/24 post bronch then plan to sign off    Labs   CBC: Recent Labs  Lab 10/30/20 0253 11/01/20 0123 11/02/20 1139 11/03/20 0748  WBC 14.4* 14.5* 15.3* 15.4*  HGB 9.0* 9.2* 10.0* 9.2*  HCT 28.8* 27.8* 31.0* 28.5*  MCV 93.8 96.2 95.7 96.3  PLT 247 248 226 371    Basic Metabolic Panel: Recent Labs  Lab 10/30/20 0253 11/01/20 0123 11/02/20 1139 11/03/20 0748  NA 133* 133* 132* 129*  K  3.9 3.6 3.9 3.5  CL 96* 96* 97* 93*  CO2 27 27 24 23   GLUCOSE 82 91 155* 90  BUN 37* 34* 20 35*  CREATININE 6.31* 6.40* 5.13* 6.61*  CALCIUM 8.8* 9.0 9.4 9.0  PHOS  --   --   --  4.8*   GFR: Estimated Creatinine Clearance: 5.6 mL/min (A) (by C-G formula based on SCr of 6.61 mg/dL (H)). Recent Labs  Lab 10/30/20 0253 11/01/20 0123 11/02/20 1139 11/03/20 0748  WBC 14.4* 14.5* 15.3* 15.4*    Liver Function Tests: Recent Labs  Lab 11/02/20 1139 11/03/20 0748  AST 27  --   ALT 17  --   ALKPHOS 77  --   BILITOT 0.4  --   PROT 5.8*  --   ALBUMIN 1.9* 1.7*   No results for input(s): LIPASE, AMYLASE in the last 168 hours. No results for input(s): AMMONIA in the last 168 hours.  ABG No results found for: PHART, PCO2ART, PO2ART, HCO3, TCO2, ACIDBASEDEF, O2SAT   Coagulation Profile: No results for input(s): INR, PROTIME in the last 168 hours.  Cardiac Enzymes: No results for input(s): CKTOTAL, CKMB, CKMBINDEX, TROPONINI in the last 168 hours.  HbA1C: Hgb A1c MFr Bld  Date/Time Value Ref Range Status  04/08/2020 04:40 PM 5.6 4.8 - 5.6 % Final    Comment:    (NOTE) Pre diabetes:          5.7%-6.4% Diabetes:              >6.4% Glycemic control for   <7.0% adults with diabetes   06/29/2019 04:22 PM 5.8 4.6 - 6.5 % Final    Comment:    Glycemic Control Guidelines for People with Diabetes:Non Diabetic:  <6%Goal of Therapy: <7%Additional Action Suggested:  >8%     CBG: No results for input(s): GLUCAP in the last 168 hours.  Review of Systems:   Weight loss 40 pounds last 6 months Cough over the last month, sometimes with some blood mixed in with mucus  Past Medical History  She,  has a past medical history of Anemia, Hepatitis, History of blood transfusion, History of kidney cancer, Hyperlipidemia, and Hypertension.   Surgical History    Past Surgical History:  Procedure Laterality Date  . AV FISTULA PLACEMENT Left 08/07/2020   Procedure: LEFT ARM ARTERIOVENOUS  (AV) FISTULA CREATION;  Surgeon: [name], MD;  Location: [facility];  Service: Vascular;  Laterality: Left;  . CATARACT EXTRACTION Bilateral    11/30 left /12/9 right  . IR FLUORO GUIDE CV LINE RIGHT  07/27/2020  . IR THORACENTESIS ASP PLEURAL SPACE W/IMG GUIDE  10/24/2020  . IR THORACENTESIS ASP PLEURAL SPACE W/IMG GUIDE  10/25/2020  . IR THORACENTESIS ASP PLEURAL SPACE W/IMG GUIDE  10/31/2020  . IR US GUIDE VASC ACCESS RIGHT  07/27/2020  . ROBOT ASSISTED LAPAROSCOPIC NEPHRECTOMY Right 01/13/2017   Procedure: XI ROBOTIC ASSISTED LAPAROSCOPIC RADICAL NEPHRECTOMY WITH LYSIS OF ADHESION;  Surgeon: Hubbard Robinson  Tresa Moore, MD;  Location: [facility];  Service: Urology;  Laterality: Right;     Social History   reports that she quit smoking about 19 years ago. Her smoking use included cigarettes. She has a 15.00 pack-year smoking history. She has never used smokeless tobacco. She reports current alcohol use. She reports that she does not use drugs.   Family History   Her family history includes Breast cancer in her sister; Hypertension in her mother; Pancreatic cancer in her father.   Allergies No Known Allergies   Home Medications  Prior to Admission medications   Medication Sig Start Date End Date Taking? Authorizing Provider  amLODipine (NORVASC) 10 MG tablet Take 1 tablet (10 mg total) by mouth daily. 04/19/20  Yes Biagio Borg, MD  ascorbic acid (VITAMIN C) 500 MG tablet Take 500 mg by mouth 2 (two) times daily.  08/31/20  Yes [provider]  Black Currant Seed Oil 500 MG CAPS Take 500 mg by mouth every morning.    Yes [provider]  Calcium Carbonate-Vit D-Min (CALCIUM 1200 PO) Take 1,200 mg by mouth every morning.    Yes [provider]  carvedilol (COREG) 12.5 MG tablet Take 1 tablet (12.5 mg total) by mouth 2 (two) times daily with a meal. 08/08/20  Yes Georgette Shell, MD  Cholecalciferol (VITAMIN D3) 50 MCG (2000 UT) capsule Take 2,000 Units by mouth every morning.  08/31/20  Yes [provider]  Garlic 2993 MG CAPS Take 2,000 mg by mouth daily.    Yes [provider]  Multiple Vitamin (MULTIVITAMIN WITH MINERALS) TABS tablet Take 1 tablet by mouth every morning.    Yes [provider]  Omega-3 Fatty Acids (FISH OIL PO) Take 1 capsule by mouth every morning.    Yes [provider]  ondansetron (ZOFRAN) 4 MG tablet Take 1 tablet (4 mg total) by mouth every 8 (eight) hours as needed for nausea or vomiting. 04/19/20  Yes Biagio Borg, MD  OVER THE COUNTER MEDICATION Take 1 capsule by mouth daily. Mega juice   Yes [provider]  polyethylene glycol (MIRALAX / GLYCOLAX) 17 g packet Take 17 g by mouth daily as needed for mild constipation. 09/12/20  Yes Domenic Polite, MD  rosuvastatin (CRESTOR) 10 MG tablet Take 1 tablet (10 mg total) by mouth daily. 09/26/20  Yes Biagio Borg, MD  benzonatate (TESSALON) 200 MG capsule Take 1 capsule (200 mg total) by mouth 3 (three) times daily as needed. Patient not taking: Reported on 10/10/2020 07/22/20   Marrian Salvage, FNP  metoprolol succinate (TOPROL-XL) 100 MG 24 hr tablet Take 100 mg by mouth daily. 10/15/20   [provider]  senna-docusate (SENOKOT-S) 8.6-50 MG tablet Take 1 tablet by mouth daily as needed for mild constipation. Patient not taking: Reported on 10/21/2020 09/12/20   Domenic Polite, MD  telmisartan (MICARDIS) 80 MG tablet Take 80 mg by mouth daily. 10/15/20   [provider]     Critical care time: NA

## 2020-11-04 NOTE — Progress Notes (Signed)
Physical Therapy Treatment Patient Details Name: Cassandra Dyer MRN: [id number] DOB: 03-19-1941 Today's Date: 11/04/2020    History of Present Illness Pt is a 79 y/o female admitted secondary to worsening SOB. Found to have pulmonary edema. PMH includes HTN, R kidney carcinoma s/p nephrectomy, COPD on 2L, and ESRD on HD.     PT Comments    Pt fully participated in session. Pt performed sit<>stand mod I, pt requiring S during ambulation due to O2 take and slight drifting R and L. Pt with 1 LOB but able to regain with S from therapist. Pt O2 remained 96-99% during ambulation on 2 L O2. Pt does continue to demonstrate deficits in balance and gait and will benefit from skilled PT to advance balance activities to maximize independence with functional mobility prior to discharge.     Follow Up Recommendations  Home health PT;Supervision for mobility/OOB     Equipment Recommendations       Recommendations for Other Services       Precautions / Restrictions Precautions Precautions: Fall Precaution Comments: watch O2 sats Restrictions Weight Bearing Restrictions: No    Mobility  Bed Mobility Overal bed mobility: Modified Independent Bed Mobility: Supine to Sit     Supine to sit: Modified independent (Device/Increase time)     General bed mobility comments: no assist required   Transfers Overall transfer level: Needs assistance Equipment used: None Transfers: Sit to/from Stand Sit to Stand: Supervision         General transfer comment: supervision for safety and O2 line mgmt  Ambulation/Gait Ambulation/Gait assistance: Supervision Gait Distance (Feet): 400 Feet Assistive device: None Gait Pattern/deviations: Step-through pattern;Decreased stride length;Drifts right/left Gait velocity: decreased   General Gait Details: slow, steady gait, no assist needed , O2 at 96-99% during ambulation on 2 L O2   Stairs             Wheelchair Mobility    Modified  [place] (Stroke Patients Only)       Balance Overall balance assessment: Needs assistance Sitting-balance support: Feet supported Sitting balance-Leahy Scale: Good     Standing balance support: No upper extremity supported;During functional activity Standing balance-Leahy Scale: Fair Standing balance comment: pt able to stand at sink for grooming tasks with no UE supported and no LOB                            Cognition Arousal/Alertness: Awake/alert Behavior During Therapy: WFL for tasks assessed/performed Overall Cognitive Status: Within Functional Limits for tasks assessed                                 General Comments: appeared anxious about test tomorrow but overall [facility]      Exercises      General Comments General comments (skin integrity, edema, etc.): Pt on 2L O2 throughout session with O2 >98% during session      Pertinent Vitals/Pain Pain Assessment: No/denies pain    Home Living                      Prior Function            PT Goals (current goals can now be found in the care plan section) Acute Rehab PT Goals Patient Stated Goal: home PT Goal Formulation: With patient Time For Goal Achievement: 11/05/20 Potential to Achieve Goals: Good Progress towards PT goals:  Progressing toward goals    Frequency    Min 3X/week      PT Plan Current plan remains appropriate    Co-evaluation              AM-PAC PT "6 Clicks" Mobility   Outcome Measure  Help needed turning from your back to your side while in a flat bed without using bedrails?: None Help needed moving from lying on your back to sitting on the side of a flat bed without using bedrails?: None Help needed moving to and from a bed to a chair (including a wheelchair)?: None Help needed standing up from a chair using your arms (e.g., wheelchair or bedside chair)?: None Help needed to walk in hospital room?: None Help needed climbing 3-5 steps with a  railing? : A Little 6 Click Score: 23    End of Session Equipment Utilized During Treatment: Gait belt;Oxygen Activity Tolerance: Patient tolerated treatment well Patient left: with call bell/phone within reach;in bed;with bed alarm set Nurse Communication: Mobility status PT Visit Diagnosis: Other abnormalities of gait and mobility (R26.89);Difficulty in walking, not elsewhere classified (R26.2)     Time: 0211-1552 PT Time Calculation (min) (ACUTE ONLY): 16 min  Charges:  $Gait Training: 8-22 mins                     Lyanne Co, DPT Acute Rehabilitation Services [phone]   [name] 11/04/2020, 12:19 PM

## 2020-11-04 NOTE — Progress Notes (Signed)
[facility] Progress Note   Subjective:   Seen in room, anxious about lung mass on CT yesterday but reports she has good support from her family. Moved HD to today to accommodate EBUS/ENB on Tuesday 11/23. Denies SOB, CP, palpitations, dizziness, abdominal pain, nausea. Does not have much of an appetite.   Objective Vitals:   11/03/20 1135 11/03/20 1237 11/03/20 1733 11/03/20 2246  BP: (!) 116/56 (!) 127/58 111/64 (!) 159/63  Pulse:  (!) 101 (!) 101 97  Resp: (!) 28 (!) 22 20 19   Temp: 97.6 F (36.4 C)  98.5 F (36.9 C) 98.4 F (36.9 C)  TempSrc: Oral  Oral Oral  SpO2:  100% 100% 100%  Weight: 49.9 kg     Height:       Physical Exam General:frail appearing female in NAD Heart:RRR, no murmurs, rubs or gallops Lungs:Decreased breath sound RLL, CTA left lung Abdomen:Soft, non-tender, non-distended, +BS Extremities:+ trace dependent edema in bilateral hips Dialysis Access:R IJ TDC, LU AVF maturing, + bruit  Additional Objective Labs: Basic Metabolic Panel: Recent Labs  Lab 11/01/20 0123 11/02/20 1139 11/03/20 0748  NA 133* 132* 129*  K 3.6 3.9 3.5  CL 96* 97* 93*  CO2 27 24 23   GLUCOSE 91 155* 90  BUN 34* 20 35*  CREATININE 6.40* 5.13* 6.61*  CALCIUM 9.0 9.4 9.0  PHOS  --   --  4.8*   Liver Function Tests: Recent Labs  Lab 11/02/20 1139 11/03/20 0748  AST 27  --   ALT 17  --   ALKPHOS 77  --   BILITOT 0.4  --   PROT 5.8*  --   ALBUMIN 1.9* 1.7*   CBC: Recent Labs  Lab 10/30/20 0253 10/30/20 0253 11/01/20 0123 11/02/20 1139 11/03/20 0748  WBC 14.4*   < > 14.5* 15.3* 15.4*  HGB 9.0*   < > 9.2* 10.0* 9.2*  HCT 28.8*   < > 27.8* 31.0* 28.5*  MCV 93.8  --  96.2 95.7 96.3  PLT 247   < > 248 226 274   < > = values in this interval not displayed.   Blood Culture    Component Value Date/Time   SDES FLUID 10/24/2020 1121   SPECREQUEST NONE 10/24/2020 1121   CULT  10/24/2020 1121    NO GROWTH 5 DAYS Performed at [facility], [place] [address], [place], [place] [address]    REPTSTATUS 10/29/2020 FINAL 10/24/2020 1121    Studies/Results: CT ANGIO CHEST PE W OR WO CONTRAST  Result Date: 11/02/2020 CLINICAL DATA:  Respiratory failure EXAM: CT ANGIOGRAPHY CHEST WITH CONTRAST TECHNIQUE: Multidetector CT imaging of the chest was performed using the standard protocol during bolus administration of intravenous contrast. Multiplanar CT image reconstructions and MIPs were obtained to evaluate the vascular anatomy. CONTRAST:  38mL OMNIPAQUE IOHEXOL 350 MG/ML SOLN COMPARISON:  CT abdomen pelvis, 04/08/2020, 08/05/2017 FINDINGS: Cardiovascular: Satisfactory opacification of the pulmonary arteries. No evidence of pulmonary embolism. The left lower lobar pulmonary arteries are nearly occluded by a left hilar mass. Normal heart size. Left coronary artery calcifications. No pericardial effusion. Aortic atherosclerosis. Mediastinum/Nodes: Numerous enlarged left hilar and mediastinal lymph nodes, with a left hilar mass. Largest discretely measurable lymph nodes measure up to 2.1 x 1.4 cm (series 8, image 56). Thyroid gland, trachea, and esophagus demonstrate no significant findings. Lungs/Pleura: Mild centrilobular emphysema. Small to moderate, left greater than right pleural effusions and associated atelectasis or consolidation. There is a left hilar mass measuring approximately  5.2 x 4.0 cm, which obstructs the left lower lobar bronchus near the origin (series 9, image 208). There is a subpleural nodule of the posterior left apex measuring 1.4 x 1.3 cm (series 8, image 31) and an additional subpleural nodule of the high medial apex measuring 0.9 x 0.6 cm (series 8, image 25). Upper Abdomen: Partially imaged left adrenal nodule, not obviously changed compared to prior examination dated 04/08/2020 measuring 1.5 x 1.0 cm (series 9, image 39). Musculoskeletal: No chest wall abnormality. No acute or significant osseous findings. Review of the  MIP images confirms the above findings. IMPRESSION: 1. Negative examination for pulmonary embolism. 2. There is a left hilar mass measuring approximately 5.2 x 4.0 cm, which obstructs the left lower lobar bronchus near the origin as well as the left lower lobar pulmonary arteries. 3. Subpleural nodules of the left pulmonary apex. 4. Numerous enlarged left hilar and mediastinal lymph nodes. 5. Constellation of findings is consistent primary lung malignancy and nodal and left-sided pulmonary metastatic disease. 6. Small to moderate, left greater than right pleural effusions and associated atelectasis or consolidation. Left pleural effusion is presumed malignant given presence of subpleural pulmonary nodules, there is no direct evidence of right sided malignant effusion or metastatic disease. 7. Partially imaged left adrenal nodule, not obviously changed compared to prior examinations and previously better characterized as a benign adenoma. Aortic Atherosclerosis (ICD10-I70.0) and Emphysema (ICD10-J43.9). Electronically Signed   By: Eddie Candle M.D.   On: 11/02/2020 19:11   Medications:  . (feeding supplement) PROSource Plus  30 mL Oral BID BM  . Chlorhexidine Gluconate Cloth  6 each Topical Q0600  . Chlorhexidine Gluconate Cloth  6 each Topical Q0600  . Chlorhexidine Gluconate Cloth  6 each Topical Q0600  . heparin  5,000 Units Subcutaneous Q8H  . multivitamin  1 tablet Oral QHS  . rosuvastatin  10 mg Oral Daily    Dialysis Orders: [facility] MWF  4h 62kg 2/2.5 bath Hep none TDC L AVF maturing (use>11/25) mircera100 q 2 weeks, (last 10/27)  venofer 100 q tx right now, 11/3 hgb 8.3 11/1 k 4  Assessment/Plan: 1. Hypoxia-reportedlyhas a baseline O2 req2L, is off O2 this AM. SOB/ resp failure appear to be all due to vol overload/ pulm edema and pleural effusions. Weights have come down about 12kgs since admission with HD and thoracentesis x2. Normal EF on echo.CTA showedleft hilar mass and  concerning for malignancy, which explains recent weight loss. Will continue to titrate down volume with HD as tolerated. Na 129 yesterday, possibly due to excess volume, follow as volume is titrated down.  2. ESRD:normally MWF at [facility] via [facility]- Has only been on HD since August.Hd HD yesterday and will have dialysis again today to accommodate procedure schedule. Next HD most likely Wednesday.  3. HTN: mult meds at home , w/ vol lowering BP's down with volume removal and meds stopped.  4. Anemia of ESRD:on mircera and venofer at OP center- Hgb 9.2.Has been on Aranesp 165mcgq Friday- stopped  due to concern for malignancy.  5. Metabolic Bone Disease:Phos at goal. Corrected Ca elevated. use low Ca bath. not on vitamin D or sensipar at OP unit. No binder on OP med list 6. Nutrition: Renal diet w/fluid restrictions.Alb low 1.6 - on protein supplements  Anice Paganini, PA-C 11/04/2020, 8:53 AM  [facility] Pager: [phone]

## 2020-11-04 NOTE — Progress Notes (Signed)
PROGRESS NOTE    Cassandra Dyer  DGU:[id number] DOB: [birth year] DOA: 10/21/2020  PCP: Biagio Borg, MD    Brief Narrative:  Cassandra Dyer is a 79 year old female with past medical history significant for ESRD on HD, history of renal cell carcinoma right kidney status post nephrectomy, essential hypertension, anemia of chronic kidney disease who presented to the ED with complaint of shortness of breath, weakness.  Missed hemodialysis on Monday.  Baseline oxygen requirement of 2 L but now up to 4 L to maintain adequate oxygenation.  In the ED, chest x-ray notable for bilateral pleural effusions and interstitial edema.  EDP consulted hospitalist group for admission secondary to acute on chronic hypoxic respiratory failure secondary to volume overload/pulmonary edema/pleural effusion.   Assessment & Plan: Acute on chronic hypoxic respiratory failure 2/2 pulmonary edema and bilateral pleural effusions COPD Patient presenting from home with progressive shortness of breath, missed HD session.  Chest x-ray on admission notable for bilateral pleural effusions, interstitial edema from volume overload.  Baseline oxygen 2 L per nasal cannula, Continues on 4 L per nasal cannula.  Underwent ultrasound-guided left thoracentesis with 1 L fluid removed on 11/11 and right thoracentesis on 11/12 with 700 mL removed.  Lights criteria consistent with transudative effusion. TTE with LVEF 95-63%, grade 2 diastolic dysfunction, mild aortic valve sclerosis without stenosis, normal IVC. --Fluid restriction 1.2 L daily --net negative 11.2L since admission --Continue supplemental oxygen, titrate to maintain SPO2 greater than 88%; currently on 2L [place] at rest; ambulated better this morning without any significant hypoxia --Continue further volume management per nephrology; next HD tomorrow --Repeat thoracentesis today, IR consulted. --11/01/2020: pt s/p two thoracentesis and d/w her about d/c today pt states she is  still little sob and is not on oxygen at home. Pt echo showed ef of 60%: 1. Left ventricular ejection fraction, by estimation, is 60 to 65%. The left ventricle has normal function. The left ventricle has no regional wall motion abnormalities. Left ventricular diastolic parameters are consistent with Grade II diastolic dysfunction (pseudonormalization). 2. Right ventricular systolic function is normal. The right ventricular size is mildly enlarged. There is moderately elevated pulmonary artery systolic pressure. The estimated right ventricular systolic pressure is 87.5 mmHg. 3. The mitral valve is grossly normal. No evidence of mitral valve regurgitation. No evidence of mitral stenosis. 4. The aortic valve is abnormal. There is mild calcification of the aortic valve. Aortic valve regurgitation is not visualized. Mild aortic valve sclerosis is present, with no evidence of aortic valve stenosis. 5. The inferior vena cava is normal in size with greater than 50% respiratory variability, suggesting right atrial pressure of 3 mmHg. --11/02/20: Pt seen today she is eating ,d with her about her ct scan which is pending. and d/c plan probably tomorrow morning also that her effusion related to hypoalbuminemia due to ESRD. --11/03/20 Pt seen today and ct scan is negative for PE but has left lung mass which is concerning for primary lung malignancy with mets. D/W pt about unfortunate finding and its contributing to her SOB and pulmonary consult for further options.greatly appreciate Consult management and care plan from pulmonology. Pt is to have bronchoscopy and EBU on Tuesday.   11/04/20 Pt seen today she is sitting in bed and is feeling better today. She is looking forward to her procedure and denies any anxiety.  Essential hypertension On Carvedilol 12.5 mg p.o. BID, irbesartan 300 mg p.o. daily, amlodipine 10 mg p.o. daily at home; discontinued by nephrology 11/16; may  be contributing to persistent  volume overload --Continue to monitor blood pressure closely  --11/01/2020 Blood pressure (!) 159/63, pulse 97, temperature 98.4 F (36.9 C), temperature source Oral, resp. rate 19, height 5\' 2"  (1.575 m), weight 49.9 kg, SpO2 100 %. BP control is excellent and we will follow no change in therapy.   --11/02/20 Blood pressure (!) 159/63, pulse 97, temperature 98.4 F (36.9 C), temperature source Oral, resp. rate 19, height 5\' 2"  (1.575 m), weight 49.9 kg, SpO2 100 %.  --11/03/20 Blood pressure (!) 127/58, pulse (!) 101, temperature 97.6 F (36.4 C), temperature source Oral, resp. rate (!) 22, height 5\' 2"  (1.575 m), weight 49.9 kg, SpO2 100 %. No change in regimen.  -- 11/04/20 Blood pressure 132/61, pulse (!) 102, temperature 98.2 F (36.8 C), temperature source Oral, resp. rate 18, height 5\' 2"  (1.575 m), weight 50.4 kg, SpO2 100 %.   HLD: Continue Crestor 10 mg p.o. daily  Anemia of chronic kidney disease Hemoglobin 8.5, stable --repeat CBC in the am. --h/h today is 50. --h/h is stable today 9.2   DVT prophylaxis: SCDs Code Status: Full code Family Communication: No family present at bedside this morning.  Disposition Plan:  Status is: Inpatient  Remains inpatient appropriate because:Unsafe d/c plan and Inpatient level of care appropriate due to severity of illness   Dispo: The patient is from: Home  Anticipated d/c is to: Home  Anticipated d/c date is: 1 day  Patient currently is not medically stable to d/c.  Plan repeat thoracentesis by IR today, HD tomorrow, repeat walk test on 2 L following HD tomorrow, if no desaturations likely DC home with home health   Consultants:   Nephrology  Interventional radiology  Procedures:  Thoracentesis, left 10/24/2020; 1L removed Thoracentesis, right 10/25/2020; 700 mL removed   Subjective/Overview: Pt seen today she is alert oriented and cooperative. Vitals are  stable. Labs are stable. This is day 11 and pt is still on oxygen. She does have h/o copd but  Is not on oxygen at home.I suspect this is kidney or hypoalbuminemia related and not chf.    Pt is alert awake and oriented , eating lunch, asking about repeat thoracentesis. D/w pt that it is likely from her low serum albumin and I was considering ct chest and then decide on d/c plan.   Pt is very sad and crying that we found the lung mass and going to think about calling her sister as she has no children. Pulmonary consult for eval of suspicious mass.   Objective: Vitals:   11/03/20 1135 11/03/20 1237 11/03/20 1733 11/03/20 2246  BP: (!) 116/56 (!) 127/58 111/64 (!) 159/63  Pulse:  (!) 101 (!) 101 97  Resp: (!) 28 (!) 22 20 19   Temp: 97.6 F (36.4 C)  98.5 F (36.9 C) 98.4 F (36.9 C)  TempSrc: Oral  Oral Oral  SpO2:  100% 100% 100%  Weight: 49.9 kg     Height:       SpO2: 100 % O2 Flow Rate (L/min): 2 L/min  Intake/Output Summary (Last 24 hours) at 11/04/2020 0912 Last data filed at 11/03/2020 1135 Gross per 24 hour  Intake --  Output 2042 ml  Net -2042 ml   Filed Weights   11/03/20 0500 11/03/20 0705 11/03/20 1135  Weight: 50.2 kg 52.8 kg 49.9 kg    Examination: Blood pressure (!) 159/63, pulse 97, temperature 98.4 F (36.9 C), temperature source Oral, resp. rate 19, height 5\' 2"  (1.575 m), weight  49.9 kg, SpO2 100 %. General exam: Appears calm and comfortable  HEENT:EOMI, perrl  Respiratory system: Clear to auscultation. Respiratory effort normal. Cardiovascular system: S1 & S2 heard, RRR. No JVD, murmurs, rubs, gallops or clicks. No pedal edema. Gastrointestinal system: Abdomen is nondistended, soft and nontender. No organomegaly or masses felt. Normal bowel sounds heard. Central nervous system: Alert and oriented.CN grossly intact No focal neurological deficits. Extremities: pt moving all 4 ext and ambulating. Skin: No rashes, lesions or ulcers Psychiatry: Judgement  and insight appear normal. Mood & affect appropriate.   Data Reviewed: I have personally reviewed following labs and imaging studies  Labs  No results for input(s): CKTOTAL, CKMB, TROPONINI in the last 72 hours. Lab Results  Component Value Date   WBC 15.4 (H) 11/03/2020   HGB 9.2 (L) 11/03/2020   HCT 28.5 (L) 11/03/2020   MCV 96.3 11/03/2020   PLT 274 11/03/2020    Recent Labs  Lab 11/02/20 1139 11/02/20 1139 11/03/20 0748  NA 132*   < > 129*  K 3.9   < > 3.5  CL 97*   < > 93*  CO2 24   < > 23  BUN 20   < > 35*  CREATININE 5.13*   < > 6.61*  CALCIUM 9.4   < > 9.0  PROT 5.8*  --   --   BILITOT 0.4  --   --   ALKPHOS 77  --   --   ALT 17  --   --   AST 27  --   --   GLUCOSE 155*   < > 90   < > = values in this interval not displayed.   Lab Results  Component Value Date   CHOL 112 06/29/2019   HDL 32.30 (L) 06/29/2019   LDLCALC 45 06/29/2019   TRIG 172.0 (H) 06/29/2019   Lab Results  Component Value Date   DDIMER 2.66 (H) 04/09/2020   Invalid input(s): POCBNP  echocardiogram   Radiology Studies: DG Chest 1 View Result Date: 10/31/2020 CLINICAL DATA:  Status post thoracentesis EXAM: CHEST  1 VIEW COMPARISON:  July 30, 2020. FINDINGS: Left pleural effusion smaller following thoracentesis. Small pleural effusion on each side. No pneumothorax. There is slight bibasilar atelectasis. Heart size and pulmonary vascular normal. No adenopathy. Central catheter tip is at the cavoatrial junction. There is aortic atherosclerosis. No bone lesions. IMPRESSION: Small pleural effusion currently on each side with mild bibasilar atelectasis. No pneumothorax. No consolidation. Stable cardiac silhouette. Aortic Atherosclerosis (ICD10-I70.0). Electronically Signed   By: Lowella Grip III M.D.   On: 10/31/2020 12:21   IR THORACENTESIS ASP PLEURAL SPACE W/IMG GUIDE Result Date: 10/31/2020 INDICATION: Patient with end-stage renal disease, bilateral pleural effusions. Request is  made for therapeutic thoracentesis. EXAM: ULTRASOUND GUIDED THERAPEUTIC RIGHT THORACENTESIS MEDICATIONS: 10 mL 1% lidocaine COMPLICATIONS: None immediate. PROCEDURE: An ultrasound guided thoracentesis was thoroughly discussed with the patient and questions answered. The benefits, risks, alternatives and complications were also discussed. The patient understands and wishes to proceed with the procedure. Written consent was obtained. Ultrasound was performed to localize and mark an adequate pocket of fluid in the right chest. The area was then prepped and draped in the normal sterile fashion. 1% Lidocaine was used for local anesthesia. Under ultrasound guidance a 6 Fr Safe-T-Centesis catheter was introduced. Thoracentesis was performed. The catheter was removed and a dressing applied. FINDINGS: A total of approximately 650 mL of amber fluid was removed. Samples were sent to the laboratory  as requested by the clinical team. IMPRESSION: Successful ultrasound guided therapeutic right thoracentesis yielding 650 mL of pleural fluid. Read by: Brynda Greathouse PA-C Electronically Signed   By: Jerilynn Mages.  Shick M.D.   On: 10/31/2020 16:13    Anti-infectives (From admission, onward)   None       Continuous Infusions:   LOS: 12 days    Para Skeans, MD [facility] Pager [phone] How to contact the [facility] Attending or Consulting provider [place] or covering provider during after hours [place], for this patient?    1. Check the care team in [facility] and look for a) attending/consulting [facility] provider listed and b) the [facility] team listed 2. Log into [email or web address] and use [place]'s universal password to access. If you do not have the password, please contact the hospital operator. 3. Locate the [facility] provider you are looking for under [facility] and page to a number that you can be directly reached. 4. If you still have difficulty reaching the provider, please page the [facility] (Director on Call) for the Hospitalists  listed on amion for assistance. [email or web address] Password [facility] 11/04/2020, 9:12 AM

## 2020-11-04 NOTE — Patient Outreach (Signed)
[facility]) Care Management  11/04/2020  Cassandra Dyer 1941/03/28 [id number]  [facility] CM Telephone Outreach Care Coordination outreach/ [facility] CM case Closure- patient hospitalized > 10 days re:  Cassandra Dyer, 79 y/o female referred to [facility] RN CM 09/12/20 by [facility] Liaison after patient experienced hospitalization September 27-30, 2021 for N/V/D with malaise and weakness; patient had bilateral pleural effusions and was diagnosed with gastroenteritis. She had missed a hemodialysis session upon becoming sick at home. Patient has history including, but not limited to, renal cell cancer with (R) nephrectomy 2018; ESRD- on hemodialysis since 07/2020; COPD- on home O2 at baseline at 2 L/min; HTN/ HLD.  Noted through review of EHR that patient experienced second inpatient hospitalization October 22, 2020 for dyspnea/ volume overload/ pleural effusion; patient remains currently admitted to [facility]; (L) hilar mass with pulmonary nodules concerning for malignancy discovered during this hospital visit; patient has planned bronchoscopy planned for 11/06/20.  Patient has had numerous complications during this hospital visit and remains in critical care.  Secure communication via EMR sent to [place] [facility] [place], notifying of patient's hospital admission/ [facility] CM case closure due to patient being hospitalized > 10 days.  Plan:  Will close case and collaborate with [facility] liaison's for ongoing discharge disposition plan.  [name], RN, BSN, [employer] [facility] Care Management  [phone]

## 2020-11-04 NOTE — Progress Notes (Signed)
Dr. Hollie Salk notified on pt status. Received new orders. V.O. You may decrease pt's tx time to 3.5 hours instead of 4 hours. Dr. Fransico Setters

## 2020-11-04 NOTE — Progress Notes (Signed)
NAME:  Cassandra Dyer, MRN:  [id number], DOB:  08-Mar-1941, LOS: 78 ADMISSION DATE:  10/21/2020, CONSULTATION DATE: 11/03/2020 REFERRING MD: Dr. Posey Pronto, TRH, CHIEF COMPLAINT: Left hilar mass, left upper lobe pulmonary nodule  Brief History   79 year old woman, renal cell carcinoma post nephrectomy 2018, end-stage renal disease on HD, COPD.  Admitted with dyspnea, volume overload and effusions.  CT shows left hilar mass, left upper lobe pulmonary nodules.  History of present illness   79 year old woman with a history of renal cell cancer post R nephrectomy, end-stage renal disease on HD since 07/2020, COPD.  She was admitted on 10/21/2020 with dyspnea after she missed dialysis the day prior.  She has been experiencing progressive weakness, has had weight loss .  Dialysis initiated, continued to require supplemental oxygen.  She had bilateral pleural effusions and underwent left thoracentesis 11/11, right thoracentesis on 11/12 and then another left thoracentesis on 11/18.  Pleural fluid from the initial tap was transudative.  I do not see any cytology from any of the procedures. She describes weight loss of about 40 pounds over the last 6 months (some of this fluid after starting HD).  She has had cough for over a month, sometimes with small amounts of blood mixed in with her sputum. She had continued oxygen need despite thoracenteses and volume removal with HD prompting a CT-PA on 11/20 which I have reviewed.  This shows a left hilar mass that impacts the left lower lobe bronchus as well as the left pulmonary artery, also left upper lobe subpleural nodules present.  PCCM consulted to evaluate the chest lesions.   Past Medical History   Past Medical History:  Diagnosis Date  . Anemia   . Hepatitis    hx of hep C - 10-20 years ago   . History of blood transfusion   . History of kidney cancer   . Hyperlipidemia   . Hypertension    not taken b/p med in 2-3 years md deceased never went to another  md     [place] [facility] Events     Consults:  IR Nephrology  Procedures:  L thora 11/11 > transudate, mixed inflammation R thora 11/12 L thora 11/18  Significant Diagnostic Tests:  CT-PA 11/20 >>   Micro Data:  L pleural fluid 11/11 >> negative  Antimicrobials:    Interim history/subjective:  NAEON. Breathing ok. Says plan for HD today.   Objective   Blood pressure (!) 159/63, pulse 97, temperature 98.4 F (36.9 C), temperature source Oral, resp. rate 19, height 5\' 2"  (1.575 m), weight 49.9 kg, SpO2 100 %.        Intake/Output Summary (Last 24 hours) at 11/04/2020 0933 Last data filed at 11/03/2020 1135 Gross per 24 hour  Intake --  Output 2042 ml  Net -2042 ml   Filed Weights   11/03/20 0500 11/03/20 0705 11/03/20 1135  Weight: 50.2 kg 52.8 kg 49.9 kg    Examination: General: Thin elderly woman, comfortable laying in bed HENT: Oropharynx clear, pupils equal Lungs: Clear bilaterally, no wheezing Cardiovascular: Distant, regular, no murmur, right tunneled HD catheter Abdomen: Soft, nondistended, positive bowel sounds Extremities: No significant edema, left upper extremity fistula with good thrill Neuro: Awake, alert, interacting appropriately, follows commands, normal strength  Resolved Hospital Problem list     Assessment & Plan:   L hilar mass, LUL pulmonary nodules with mediastinal LAD, all concerning for malignancy. Most consistent with primary lung cancer, consider also metastatic renal cell CA. Planned EBUS/Nav  w/ Dr. Lamonte Sakai 11/23.  -plan to see 11/24 post bronch then plan to sign off    Labs   CBC: Recent Labs  Lab 10/30/20 0253 11/01/20 0123 11/02/20 1139 11/03/20 0748  WBC 14.4* 14.5* 15.3* 15.4*  HGB 9.0* 9.2* 10.0* 9.2*  HCT 28.8* 27.8* 31.0* 28.5*  MCV 93.8 96.2 95.7 96.3  PLT 247 248 226 323    Basic Metabolic Panel: Recent Labs  Lab 10/30/20 0253 11/01/20 0123 11/02/20 1139 11/03/20 0748  NA 133* 133* 132* 129*  K  3.9 3.6 3.9 3.5  CL 96* 96* 97* 93*  CO2 27 27 24 23   GLUCOSE 82 91 155* 90  BUN 37* 34* 20 35*  CREATININE 6.31* 6.40* 5.13* 6.61*  CALCIUM 8.8* 9.0 9.4 9.0  PHOS  --   --   --  4.8*   GFR: Estimated Creatinine Clearance: 5.6 mL/min (A) (by C-G formula based on SCr of 6.61 mg/dL (H)). Recent Labs  Lab 10/30/20 0253 11/01/20 0123 11/02/20 1139 11/03/20 0748  WBC 14.4* 14.5* 15.3* 15.4*    Liver Function Tests: Recent Labs  Lab 11/02/20 1139 11/03/20 0748  AST 27  --   ALT 17  --   ALKPHOS 77  --   BILITOT 0.4  --   PROT 5.8*  --   ALBUMIN 1.9* 1.7*   No results for input(s): LIPASE, AMYLASE in the last 168 hours. No results for input(s): AMMONIA in the last 168 hours.  ABG No results found for: PHART, PCO2ART, PO2ART, HCO3, TCO2, ACIDBASEDEF, O2SAT   Coagulation Profile: No results for input(s): INR, PROTIME in the last 168 hours.  Cardiac Enzymes: No results for input(s): CKTOTAL, CKMB, CKMBINDEX, TROPONINI in the last 168 hours.  HbA1C: Hgb A1c MFr Bld  Date/Time Value Ref Range Status  04/08/2020 04:40 PM 5.6 4.8 - 5.6 % Final    Comment:    (NOTE) Pre diabetes:          5.7%-6.4% Diabetes:              >6.4% Glycemic control for   <7.0% adults with diabetes   06/29/2019 04:22 PM 5.8 4.6 - 6.5 % Final    Comment:    Glycemic Control Guidelines for People with Diabetes:Non Diabetic:  <6%Goal of Therapy: <7%Additional Action Suggested:  >8%     CBG: No results for input(s): GLUCAP in the last 168 hours.  Review of Systems:   Weight loss 40 pounds last 6 months Cough over the last month, sometimes with some blood mixed in with mucus  Past Medical History  She,  has a past medical history of Anemia, Hepatitis, History of blood transfusion, History of kidney cancer, Hyperlipidemia, and Hypertension.   Surgical History    Past Surgical History:  Procedure Laterality Date  . AV FISTULA PLACEMENT Left 08/07/2020   Procedure: LEFT ARM ARTERIOVENOUS  (AV) FISTULA CREATION;  Surgeon: [name], MD;  Location: [place];  Service: Vascular;  Laterality: Left;  . CATARACT EXTRACTION Bilateral    11/30 left /12/9 right  . IR FLUORO GUIDE CV LINE RIGHT  07/27/2020  . IR THORACENTESIS ASP PLEURAL SPACE W/IMG GUIDE  10/24/2020  . IR THORACENTESIS ASP PLEURAL SPACE W/IMG GUIDE  10/25/2020  . IR THORACENTESIS ASP PLEURAL SPACE W/IMG GUIDE  10/31/2020  . IR US GUIDE VASC ACCESS RIGHT  07/27/2020  . ROBOT ASSISTED LAPAROSCOPIC NEPHRECTOMY Right 01/13/2017   Procedure: XI ROBOTIC ASSISTED LAPAROSCOPIC RADICAL NEPHRECTOMY WITH LYSIS OF ADHESION;  Surgeon: Hubbard Robinson  Tresa Moore, MD;  Location: [facility];  Service: Urology;  Laterality: Right;     Social History   reports that she quit smoking about 19 years ago. Her smoking use included cigarettes. She has a 15.00 pack-year smoking history. She has never used smokeless tobacco. She reports current alcohol use. She reports that she does not use drugs.   Family History   Her family history includes Breast cancer in her sister; Hypertension in her mother; Pancreatic cancer in her father.   Allergies No Known Allergies   Home Medications  Prior to Admission medications   Medication Sig Start Date End Date Taking? Authorizing Provider  amLODipine (NORVASC) 10 MG tablet Take 1 tablet (10 mg total) by mouth daily. 04/19/20  Yes Biagio Borg, MD  ascorbic acid (VITAMIN C) 500 MG tablet Take 500 mg by mouth 2 (two) times daily.  08/31/20  Yes [provider]  Black Currant Seed Oil 500 MG CAPS Take 500 mg by mouth every morning.    Yes [provider]  Calcium Carbonate-Vit D-Min (CALCIUM 1200 PO) Take 1,200 mg by mouth every morning.    Yes [provider]  carvedilol (COREG) 12.5 MG tablet Take 1 tablet (12.5 mg total) by mouth 2 (two) times daily with a meal. 08/08/20  Yes Georgette Shell, MD  Cholecalciferol (VITAMIN D3) 50 MCG (2000 UT) capsule Take 2,000 Units by mouth every morning.  08/31/20  Yes [provider]  Garlic 2094 MG CAPS Take 2,000 mg by mouth daily.    Yes [provider]  Multiple Vitamin (MULTIVITAMIN WITH MINERALS) TABS tablet Take 1 tablet by mouth every morning.    Yes [provider]  Omega-3 Fatty Acids (FISH OIL PO) Take 1 capsule by mouth every morning.    Yes [provider]  ondansetron (ZOFRAN) 4 MG tablet Take 1 tablet (4 mg total) by mouth every 8 (eight) hours as needed for nausea or vomiting. 04/19/20  Yes Biagio Borg, MD  OVER THE COUNTER MEDICATION Take 1 capsule by mouth daily. Mega juice   Yes [provider]  polyethylene glycol (MIRALAX / GLYCOLAX) 17 g packet Take 17 g by mouth daily as needed for mild constipation. 09/12/20  Yes Domenic Polite, MD  rosuvastatin (CRESTOR) 10 MG tablet Take 1 tablet (10 mg total) by mouth daily. 09/26/20  Yes Biagio Borg, MD  benzonatate (TESSALON) 200 MG capsule Take 1 capsule (200 mg total) by mouth 3 (three) times daily as needed. Patient not taking: Reported on 10/10/2020 07/22/20   Marrian Salvage, FNP  metoprolol succinate (TOPROL-XL) 100 MG 24 hr tablet Take 100 mg by mouth daily. 10/15/20   [provider]  senna-docusate (SENOKOT-S) 8.6-50 MG tablet Take 1 tablet by mouth daily as needed for mild constipation. Patient not taking: Reported on 10/21/2020 09/12/20   Domenic Polite, MD  telmisartan (MICARDIS) 80 MG tablet Take 80 mg by mouth daily. 10/15/20   [provider]     Critical care time: NA

## 2020-11-04 NOTE — Care Management Important Message (Signed)
Important Message  Patient Details  Name: Cassandra Dyer MRN: [id number] Date of Birth: 1941/04/03   Medicare Important Message Given:  Yes     Lajuana Patchell P Rosielee Corporan 11/04/2020, 3:00 PM

## 2020-11-04 NOTE — Progress Notes (Signed)
Pt tx plan for 4 hours with 2.5/L goal removal. Pt goal reduced d/t BP trending down as low as 90's, pt symptomatic.Goal reduced and fluid given to help with pressure support.

## 2020-11-04 NOTE — Progress Notes (Signed)
Occupational Therapy Treatment Patient Details Name: Cassandra Dyer MRN: [id number] DOB: Aug 17, 1941 Today's Date: 11/04/2020    History of present illness Pt is a 79 y/o female admitted secondary to worsening SOB. Found to have pulmonary edema. PMH includes HTN, R kidney carcinoma s/p nephrectomy, COPD on 2L, and ESRD on HD.    OT comments  Pt making good progress towards OT goals this session. Pt able to complete bed mobility, MOD I and demonstrates improvements in activity tolerance as pt able to stand at sink ~ 10 mins for grooming tasks with no UE support and no LOB. Pt on 2L during session with O2 >98%. Pt completed household distance functional mobility with gross supervision with no AD. Pt completes LB ADLs with set- up assist and UB standing ADLs with supervision. Agree with DC plan below, will follow acutely per POC.     Follow Up Recommendations  No OT follow up;Supervision - Intermittent    Equipment Recommendations  3 in 1 bedside commode    Recommendations for Other Services      Precautions / Restrictions Precautions Precautions: Fall Precaution Comments: watch O2 sats Restrictions Weight Bearing Restrictions: No       Mobility Bed Mobility Overal bed mobility: Modified Independent Bed Mobility: Supine to Sit     Supine to sit: Modified independent (Device/Increase time)     General bed mobility comments: no assist required   Transfers Overall transfer level: Needs assistance Equipment used: None Transfers: Sit to/from Stand Sit to Stand: Supervision         General transfer comment: supervision for safety and O2 line mgmt    Balance Overall balance assessment: Needs assistance Sitting-balance support: Feet supported Sitting balance-Leahy Scale: Good     Standing balance support: No upper extremity supported;During functional activity Standing balance-Leahy Scale: Fair Standing balance comment: pt able to stand at sink for grooming tasks with  no UE supported and no LOB                           ADL either performed or assessed with clinical judgement   ADL Overall ADL's : Needs assistance/impaired     Grooming: Wash/dry face;Oral care;Standing;Supervision/safety Grooming Details (indicate cue type and reason): close supervision, however pt able to stand at sink ~ 10 mins with no LOB,  O2 WFL     Lower Body Bathing: Supervison/ safety Lower Body Bathing Details (indicate cue type and reason): simulated via LB dressing from long sitting, pt reports sitting in shower chair and using detachable shower head for LB bathing at home     Lower Body Dressing: Bed level;Set up Lower Body Dressing Details (indicate cue type and reason): s/u from long sitting in bed bed Toilet Transfer: Supervision/safety;Ambulation Toilet Transfer Details (indicate cue type and reason): simulated via functional mobility with no AD; gross supervision for line mgmt       Tub/Shower Transfer Details (indicate cue type and reason): pt reports tub shower at home with seat and detachable shower head Functional mobility during ADLs: Supervision/safety General ADL Comments: pt progressing well towards OT goals, pt able to stand at sink ~ 10 mins for grooming tasks and performs functional mobility with no AD at gross supervision level for O2 line mgmt     Vision       Perception     Praxis      Cognition Arousal/Alertness: Awake/alert Behavior During Therapy: WFL for tasks assessed/performed Overall Cognitive Status: Within  Functional Limits for tasks assessed                                 General Comments: appeared anxious about test tomorrow but overall [facility]        Exercises     Shoulder Instructions       General Comments Pt on 2L O2 throughout session with O2 >98% during session    Pertinent Vitals/ Pain       Pain Assessment: No/denies pain  Home Living                                           Prior Functioning/Environment              Frequency  Min 2X/week        Progress Toward Goals  OT Goals(current goals can now be found in the care plan section)  Progress towards OT goals: Progressing toward goals  Acute Rehab OT Goals Patient Stated Goal: home OT Goal Formulation: With patient Time For Goal Achievement: 11/06/20 Potential to Achieve Goals: Good  Plan Discharge plan remains appropriate;Frequency remains appropriate    Co-evaluation                 AM-PAC OT "6 Clicks" Daily Activity     Outcome Measure   Help from another person eating meals?: None Help from another person taking care of personal grooming?: None Help from another person toileting, which includes using toliet, bedpan, or urinal?: None Help from another person bathing (including washing, rinsing, drying)?: A Little Help from another person to put on and taking off regular upper body clothing?: None Help from another person to put on and taking off regular lower body clothing?: None 6 Click Score: 23    End of Session Equipment Utilized During Treatment: Oxygen;Other (comment) (2L)  OT Visit Diagnosis: Other abnormalities of gait and mobility (R26.89);Other (comment);Muscle weakness (generalized) (M62.81)   Activity Tolerance Patient tolerated treatment well   Patient Left in chair;with call bell/phone within reach   Nurse Communication Mobility status        Time: 1655-3748 OT Time Calculation (min): 22 min  Charges: OT General Charges $OT Visit: 1 Visit OT Treatments $Self Care/Home Management : 8-22 mins  Lanier Clam., COTA/L Acute Rehabilitation Services [phone] [place] 11/04/2020, 9:30 AM

## 2020-11-05 ENCOUNTER — Inpatient Hospital Stay (HOSPITAL_COMMUNITY): Payer: Medicare Other

## 2020-11-05 ENCOUNTER — Inpatient Hospital Stay (HOSPITAL_COMMUNITY): Payer: Medicare Other | Admitting: Certified Registered"

## 2020-11-05 ENCOUNTER — Encounter (HOSPITAL_COMMUNITY): Payer: Self-pay | Admitting: Internal Medicine

## 2020-11-05 ENCOUNTER — Encounter (HOSPITAL_COMMUNITY)
Admission: EM | Disposition: A | Discharge status: Home Health | Payer: Self-pay | Source: Home / Self Care | Attending: Internal Medicine

## 2020-11-05 ENCOUNTER — Encounter: Payer: Self-pay | Admitting: Internal Medicine

## 2020-11-05 ENCOUNTER — Other Ambulatory Visit: Payer: Self-pay

## 2020-11-05 DIAGNOSIS — R590 Localized enlarged lymph nodes: Secondary | ICD-10-CM | POA: Diagnosis present

## 2020-11-05 DIAGNOSIS — R918 Other nonspecific abnormal finding of lung field: Secondary | ICD-10-CM | POA: Diagnosis not present

## 2020-11-05 HISTORY — PX: VIDEO BRONCHOSCOPY: SHX5072

## 2020-11-05 HISTORY — PX: BRONCHIAL BRUSHINGS: SHX5108

## 2020-11-05 HISTORY — PX: BRONCHIAL NEEDLE ASPIRATION BIOPSY: SHX5106

## 2020-11-05 HISTORY — PX: ELECTROMAGNETIC NAVIGATION BROCHOSCOPY: SHX5369

## 2020-11-05 HISTORY — PX: FINE NEEDLE ASPIRATION: SHX5430

## 2020-11-05 HISTORY — PX: ENDOBRONCHIAL ULTRASOUND: SHX5096

## 2020-11-05 HISTORY — PX: BRONCHIAL BIOPSY: SHX5109

## 2020-11-05 LAB — POCT I-STAT, CHEM 8
BUN: 14 mg/dL (ref 8–23)
Calcium, Ion: 1.23 mmol/L (ref 1.15–1.40)
Chloride: 95 mmol/L — ABNORMAL LOW (ref 98–111)
Creatinine, Ser: 3.2 mg/dL — ABNORMAL HIGH (ref 0.44–1.00)
Glucose, Bld: 107 mg/dL — ABNORMAL HIGH (ref 70–99)
HCT: 33 % — ABNORMAL LOW (ref 36.0–46.0)
Hemoglobin: 11.2 g/dL — ABNORMAL LOW (ref 12.0–15.0)
Potassium: 4 mmol/L (ref 3.5–5.1)
Sodium: 133 mmol/L — ABNORMAL LOW (ref 135–145)
TCO2: 27 mmol/L (ref 22–32)

## 2020-11-05 SURGERY — BRONCHOSCOPY, WITH FLUOROSCOPY
Anesthesia: General

## 2020-11-05 MED ORDER — CHLORHEXIDINE GLUCONATE 0.12 % MT SOLN: 15.0000 mL | Freq: Once | OROMUCOSAL | Status: AC

## 2020-11-05 MED ORDER — FENTANYL CITRATE (PF) 100 MCG/2ML IJ SOLN: 25.0000 ug | INTRAMUSCULAR | Status: DC | PRN

## 2020-11-05 MED ORDER — PHENYLEPHRINE HCL-NACL 10-0.9 MG/250ML-% IV SOLN
INTRAVENOUS | Status: DC | PRN
  Administered 2020-11-05: 50 ug/min via INTRAVENOUS

## 2020-11-05 MED ORDER — FENTANYL CITRATE (PF) 100 MCG/2ML IJ SOLN
INTRAMUSCULAR | Status: DC | PRN
  Administered 2020-11-05: 50 ug via INTRAVENOUS

## 2020-11-05 MED ORDER — CHLORHEXIDINE GLUCONATE CLOTH 2 % EX PADS: 6.0000 | MEDICATED_PAD | Freq: Every day | CUTANEOUS | Status: DC

## 2020-11-05 MED ORDER — SODIUM CHLORIDE 0.9 % IV SOLN: INTRAVENOUS | Status: DC

## 2020-11-05 MED ORDER — PROPOFOL 10 MG/ML IV BOLUS
INTRAVENOUS | Status: DC | PRN
  Administered 2020-11-05: 50 mg via INTRAVENOUS

## 2020-11-05 MED ORDER — NEOSTIGMINE METHYLSULFATE 3 MG/3ML IV SOSY
PREFILLED_SYRINGE | INTRAVENOUS | Status: DC | PRN
  Administered 2020-11-05: 3 mg via INTRAVENOUS

## 2020-11-05 MED ORDER — LIDOCAINE 2% (20 MG/ML) 5 ML SYRINGE
INTRAMUSCULAR | Status: DC | PRN
  Administered 2020-11-05: 60 mg via INTRAVENOUS

## 2020-11-05 MED ORDER — ONDANSETRON HCL 4 MG/2ML IJ SOLN
INTRAMUSCULAR | Status: DC | PRN
  Administered 2020-11-05: 4 mg via INTRAVENOUS

## 2020-11-05 MED ORDER — CHLORHEXIDINE GLUCONATE 0.12 % MT SOLN
OROMUCOSAL | Status: AC
  Administered 2020-11-05: 15 mL via OROMUCOSAL
  Filled 2020-11-05: qty 15

## 2020-11-05 MED ORDER — GLYCOPYRROLATE PF 0.2 MG/ML IJ SOSY
PREFILLED_SYRINGE | INTRAMUSCULAR | Status: DC | PRN
  Administered 2020-11-05: .3 mg via INTRAVENOUS

## 2020-11-05 MED ORDER — ROCURONIUM BROMIDE 10 MG/ML (PF) SYRINGE
PREFILLED_SYRINGE | INTRAVENOUS | Status: DC | PRN
  Administered 2020-11-05: 40 mg via INTRAVENOUS

## 2020-11-05 MED ORDER — BOOST / RESOURCE BREEZE PO LIQD CUSTOM
1.0000 | Freq: Once | ORAL | Status: AC
  Administered 2020-11-05: 1 via ORAL

## 2020-11-05 MED ORDER — ACETAMINOPHEN 500 MG PO TABS
1000.0000 mg | ORAL_TABLET | Freq: Once | ORAL | Status: AC
  Administered 2020-11-05: 1000 mg via ORAL
  Filled 2020-11-05: qty 2

## 2020-11-05 MED ORDER — DEXAMETHASONE SODIUM PHOSPHATE 10 MG/ML IJ SOLN
INTRAMUSCULAR | Status: DC | PRN
  Administered 2020-11-05: 10 mg via INTRAVENOUS

## 2020-11-05 MED ORDER — ONDANSETRON HCL 4 MG/2ML IJ SOLN: 4.0000 mg | Freq: Once | INTRAMUSCULAR | Status: DC | PRN

## 2020-11-05 NOTE — Progress Notes (Signed)
[facility] Progress Note   Subjective:  Had bronchoscopy this AM, reports it went well. Breathing is at baseline. No CP, palpitations, dizziness or GI upset.     Objective Vitals:   11/04/20 1759 11/04/20 1810 11/04/20 2321 11/05/20 0442  BP: (!) 141/58 132/61 (!) 144/78 (!) 156/62  Pulse: 96 (!) 102 97 98  Resp: (!) 24 18 18 18   Temp: 98.9 F (37.2 C) 98.2 F (36.8 C) 98.6 F (37 C) 99 F (37.2 C)  TempSrc: Oral Oral Oral Oral  SpO2:  100% 98% 100%  Weight:    49.3 kg  Height:       Physical Exam General:frail appearing female in NAD Heart:RRR, no murmurs, rubs or gallops Lungs:Decreased breath sound RLL, CTA left lung Abdomen:Soft, non-tender, non-distended, +BS Extremities: no edema b/l lower extremities Dialysis Access:R IJ TDC, LU AVFmaturing, + bruit  Additional Objective Labs: Basic Metabolic Panel: Recent Labs  Lab 11/02/20 1139 11/02/20 1139 11/03/20 0748 11/04/20 0924 11/05/20 0837  NA 132*   < > 129* 134* 133*  K 3.9   < > 3.5 3.8 4.0  CL 97*   < > 93* 96* 95*  CO2 24  --  23 25  --   GLUCOSE 155*   < > 90 106* 107*  BUN 20   < > 35* 22 14  CREATININE 5.13*   < > 6.61* 4.33* 3.20*  CALCIUM 9.4  --  9.0 9.8  --   PHOS  --   --  4.8*  --   --    < > = values in this interval not displayed.   Liver Function Tests: Recent Labs  Lab 11/02/20 1139 11/03/20 0748  AST 27  --   ALT 17  --   ALKPHOS 77  --   BILITOT 0.4  --   PROT 5.8*  --   ALBUMIN 1.9* 1.7*   CBC: Recent Labs  Lab 10/30/20 0253 10/30/20 0253 11/01/20 0123 11/01/20 0123 11/02/20 1139 11/02/20 1139 11/03/20 0748 11/04/20 0924 11/05/20 0837  WBC 14.4*   < > 14.5*   < > 15.3*  --  15.4* 16.3*  --   HGB 9.0*   < > 9.2*   < > 10.0*   < > 9.2* 10.4* 11.2*  HCT 28.8*   < > 27.8*   < > 31.0*   < > 28.5* 32.7* 33.0*  MCV 93.8  --  96.2  --  95.7  --  96.3 94.2  --   PLT 247   < > 248   < > 226  --  274 281  --    < > = values in this interval not  displayed.   Blood Culture    Component Value Date/Time   SDES FLUID 10/24/2020 1121   SPECREQUEST NONE 10/24/2020 1121   CULT  10/24/2020 1121    NO GROWTH 5 DAYS Performed at [facility], [place] [address], [place], [place] [address]    REPTSTATUS 10/29/2020 FINAL 10/24/2020 1121   Medications: . sodium chloride 10 mL/hr at 11/05/20 0828   . [MAR Hold] (feeding supplement) PROSource Plus  30 mL Oral BID BM  . [MAR Hold] Chlorhexidine Gluconate Cloth  6 each Topical Q0600  . [MAR Hold] Chlorhexidine Gluconate Cloth  6 each Topical Q0600  . [MAR Hold] Chlorhexidine Gluconate Cloth  6 each Topical Q0600  . [MAR Hold] heparin  5,000 Units Subcutaneous Q8H  . [MAR Hold] multivitamin  1 tablet  Oral QHS  . [MAR Hold] rosuvastatin  10 mg Oral Daily    Dialysis Orders: [facility] MWF  4h 62kg 2/2.5 bath Hep none TDC L AVF maturing (use>11/25) mircera100 q 2 weeks, (last 10/27)  venofer 100 q tx right now, 11/3 hgb 8.3 11/1 k 4  Assessment/Plan: 1. Hypoxia-reportedlyhas a baseline O2 req2L,is off O2 this AM. SOB/ resp failure appear to be all due to vol overload/ pulm edema and pleural effusions. Weights have come down about 12kgs since admission with HD and thoracentesis x2.Normal EF on echo.CTA showedleft hilar mass and concerning for malignancy, which explains recent weight loss. Will continue to titrate down volume with HD as tolerated.Had HD yesterday and only tolerated 1.3L UF, may be getting close to new EDW.  2. ESRD:normally MWF at [place] via [facility]- Has only been on HD since August.Had HD 11/21 and 11/23 yesterday and will have dialysis again today to accommodate procedure schedule. Next HD tomorrow. 3. HTN: mult meds at home , w/ vol lowering BP's downwith volume removaland meds stopped.  4. Anemia of ESRD:on mircera and venofer at OP center- Hgb 11.2Has been onAranesp 1103mcgq Friday- stopped  due to concern for malignancy. 5. Metabolic Bone Disease:Phos at goal.  Corrected Ca elevated. use low Ca bath. not on vitamin D or sensipar at OP unit. No binder on OP med list 6. Nutrition: Renal diet w/fluid restrictions.Alb low 1.6 -onprotein supplements  Anice Paganini, PA-C 11/05/2020, 10:01 AM  [facility] Pager: [phone]

## 2020-11-05 NOTE — Progress Notes (Signed)
PT Cancellation Note  Patient Details Name: Cassandra Dyer MRN: [id number] DOB: Feb 11, 1941   Cancelled Treatment:     pt requesting to rest after procedure. Lyanne Co, DPT Acute Rehabilitation Services [phone]   [name] 11/05/2020, 1:59 PM

## 2020-11-05 NOTE — Transfer of Care (Signed)
Immediate Anesthesia Transfer of Care Note  Patient: Cassandra Dyer  Procedure(s) Performed: VIDEO BRONCHOSCOPY WITH FLUORO (N/A ) ELECTROMAGNETIC NAVIGATION BRONCHOSCOPY (N/A ) ENDOBRONCHIAL ULTRASOUND (N/A ) BRONCHIAL BIOPSIES BRONCHIAL BRUSHINGS BRONCHIAL NEEDLE ASPIRATION BIOPSIES FINE NEEDLE ASPIRATION (FNA) LINEAR  Patient Location: PACU  Anesthesia Type:General  Level of Consciousness: awake, alert , oriented and patient cooperative  Airway & Oxygen Therapy: Patient Spontanous Breathing and Patient connected to face mask oxygen  Post-op Assessment: Report given to RN and Post -op Vital signs reviewed and stable  Post vital signs: Reviewed and stable  Last Vitals:  Vitals Value Taken Time  BP 194/54 11/05/20 1144  Temp 36.9 C 11/05/20 1144  Pulse 100 11/05/20 1144  Resp 18 11/05/20 1144  SpO2 100 % 11/05/20 1144    Last Pain:  Vitals:   11/05/20 1144  TempSrc:   PainSc: Asleep         Complications: No complications documented.

## 2020-11-05 NOTE — Anesthesia Procedure Notes (Signed)
Procedure Name: Intubation Date/Time: 11/05/2020 9:58 AM Performed by: Cleda Daub, CRNA Pre-anesthesia Checklist: Patient identified, Emergency Drugs available, Suction available and Patient being monitored Patient Re-evaluated:Patient Re-evaluated prior to induction Oxygen Delivery Method: Circle system utilized Preoxygenation: Pre-oxygenation with 100% oxygen Induction Type: IV induction Ventilation: Mask ventilation without difficulty Laryngoscope Size: Miller and 2 Grade View: Grade I Tube type: Oral Tube size: 8.5 mm Number of attempts: 1 Airway Equipment and Method: Stylet and Oral airway Placement Confirmation: ETT inserted through vocal cords under direct vision,  positive ETCO2 and breath sounds checked- equal and bilateral Secured at: 21 cm Tube secured with: Tape Dental Injury: Teeth and Oropharynx as per pre-operative assessment

## 2020-11-05 NOTE — Progress Notes (Signed)
PROGRESS NOTE    Cassandra Dyer  OVF:[id number] DOB: [birth year] DOA: 10/21/2020  PCP: Biagio Borg, MD    Brief Narrative:  Cassandra Dyer is a 79 year old female with past medical history significant for ESRD on HD, history of renal cell carcinoma right kidney status post nephrectomy, essential hypertension, anemia of chronic kidney disease who presented to the ED with complaint of shortness of breath, weakness.  Missed hemodialysis on Monday.  Baseline oxygen requirement of 2 L but now up to 4 L to maintain adequate oxygenation.  In the ED, chest x-ray notable for bilateral pleural effusions and interstitial edema.  EDP consulted hospitalist group for admission secondary to acute on chronic hypoxic respiratory failure secondary to volume overload/pulmonary edema/pleural effusion.   Assessment & Plan: Acute on chronic hypoxic respiratory failure 2/2 pulmonary edema and bilateral pleural effusions COPD Patient presenting from home with progressive shortness of breath, missed HD session.  Chest x-ray on admission notable for bilateral pleural effusions, interstitial edema from volume overload.  Baseline oxygen 2 L per nasal cannula, Continues on 4 L per nasal cannula.  Underwent ultrasound-guided left thoracentesis with 1 L fluid removed on 11/11 and right thoracentesis on 11/12 with 700 mL removed.  Lights criteria consistent with transudative effusion. TTE with LVEF 84-16%, grade 2 diastolic dysfunction, mild aortic valve sclerosis without stenosis, normal IVC. --Fluid restriction 1.2 L daily --net negative 11.2L since admission --Continue supplemental oxygen, titrate to maintain SPO2 greater than 88%; currently on 2L [place] at rest; ambulated better this morning without any significant hypoxia --Continue further volume management per nephrology; next HD tomorrow --Repeat thoracentesis today, IR consulted. --11/01/2020: pt s/p two thoracentesis and d/w her about d/c today pt states she is  still little sob and is not on oxygen at home. Pt echo showed ef of 60%: 1. Left ventricular ejection fraction, by estimation, is 60 to 65%. The left ventricle has normal function. The left ventricle has no regional wall motion abnormalities. Left ventricular diastolic parameters are consistent with Grade II diastolic dysfunction (pseudonormalization). 2. Right ventricular systolic function is normal. The right ventricular size is mildly enlarged. There is moderately elevated pulmonary artery systolic pressure. The estimated right ventricular systolic pressure is 60.6 mmHg. 3. The mitral valve is grossly normal. No evidence of mitral valve regurgitation. No evidence of mitral stenosis. 4. The aortic valve is abnormal. There is mild calcification of the aortic valve. Aortic valve regurgitation is not visualized. Mild aortic valve sclerosis is present, with no evidence of aortic valve stenosis. 5. The inferior vena cava is normal in size with greater than 50% respiratory variability, suggesting right atrial pressure of 3 mmHg. --11/02/20: Pt seen today she is eating ,d with her about her ct scan which is pending. and d/c plan probably tomorrow morning also that her effusion related to hypoalbuminemia due to ESRD. --11/03/20 Pt seen today and ct scan is negative for PE but has left lung mass which is concerning for primary lung malignancy with mets. D/W pt about unfortunate finding and its contributing to her SOB and pulmonary consult for further options.greatly appreciate Consult management and care plan from pulmonology. Pt is to have bronchoscopy and EBU on Tuesday.   11/04/20 Pt seen today she is sitting in bed and is feeling better today. She is looking forward to her procedure and denies any anxiety. 11/05/20 Pt is awake and oriented and comfortable no pain post procedure.    Essential hypertension On Carvedilol 12.5 mg p.o. BID, irbesartan 300 mg  p.o. daily, amlodipine 10 mg p.o.  daily at home; discontinued by nephrology 11/16; may be contributing to persistent volume overload --Continue to monitor blood pressure closely  --11/01/2020 Blood pressure 140/64, pulse 93, temperature 98.1 F (36.7 C), temperature source Oral, resp. rate 19, height 5\' 2"  (1.575 m), weight 49.3 kg, SpO2 100 %. BP control is excellent and we will follow no change in therapy.   --11/02/20 Blood pressure 140/64, pulse 93, temperature 98.1 F (36.7 C), temperature source Oral, resp. rate 19, height 5\' 2"  (1.575 m), weight 49.3 kg, SpO2 100 %.  --11/03/20 Blood pressure (!) 127/58, pulse (!) 101, temperature 97.6 F (36.4 C), temperature source Oral, resp. rate (!) 22, height 5\' 2"  (1.575 m), weight 49.9 kg, SpO2 100 %. No change in regimen.  -- 11/04/20 Blood pressure 132/61, pulse (!) 102, temperature 98.2 F (36.8 C), temperature source Oral, resp. rate 18, height 5\' 2"  (1.575 m), weight 50.4 kg, SpO2 100 %.  11/05/20 Blood pressure 140/64, pulse 93, temperature 98.1 F (36.7 C), temperature source Oral, resp. rate 19, height 5\' 2"  (1.575 m), weight 49.3 kg, SpO2 100 %. BP control is good no change in pain.    HLD: Continue Crestor 10 mg p.o. daily  Anemia of chronic kidney disease Hemoglobin 8.5, stable --repeat CBC in the am. --h/h today is 14. --h/h is stable today 9.2 --h/h is 11.2 and 33.0  DVT prophylaxis: SCDs Code Status: Full code Family Communication: No family present at bedside this morning.  Disposition Plan:  Status is: Inpatient  Remains inpatient appropriate because:Unsafe d/c plan and Inpatient level of care appropriate due to severity of illness   Dispo: The patient is from: Home  Anticipated d/c is to: Home  Anticipated d/c date is: 1 day  Patient currently is not medically stable to d/c.  Plan repeat thoracentesis by IR today, HD tomorrow, repeat walk test on 2 L following HD tomorrow, if no desaturations  likely DC home with home health   Consultants:   Nephrology  Interventional radiology  Procedures:  Thoracentesis, left 10/24/2020; 1L removed Thoracentesis, right 10/25/2020; 700 mL removed   Subjective/Overview: Pt seen today she is alert oriented and cooperative. Vitals are stable. Labs are stable. This is day 11 and pt is still on oxygen. She does have h/o copd but  Is not on oxygen at home.I suspect this is kidney or hypoalbuminemia related and not chf.    Pt is alert awake and oriented , eating lunch, asking about repeat thoracentesis. D/w pt that it is likely from her low serum albumin and I was considering ct chest and then decide on d/c plan.   Pt is very sad and crying that we found the lung mass and going to think about calling her sister as she has no children. Pulmonary consult for eval of suspicious mass.    Pt is post bronchoscopy and is alert oriented and sister at bedside and reports sore throat.  States she is ok  And called her sister. She does have dialysis in am. Probably d/c in am if no hemoptysis or persistent cough or any other issues.  Objective: Vitals:   11/05/20 1144 11/05/20 1145 11/05/20 1200 11/05/20 1233  BP: (!) 194/54 (!) 166/60 (!) 157/63 140/64  Pulse: 100 97 96 93  Resp: 18 20 20 19   Temp: 98.4 F (36.9 C)   98.1 F (36.7 C)  TempSrc:    Oral  SpO2: 100% 100% 100% 100%  Weight:  Height:       SpO2: 100 % O2 Flow Rate (L/min): 2 L/min  Intake/Output Summary (Last 24 hours) at 11/05/2020 1616 Last data filed at 11/05/2020 1147 Gross per 24 hour  Intake 300 ml  Output 1305 ml  Net -1005 ml   Filed Weights   11/04/20 1418 11/04/20 1753 11/05/20 0442  Weight: 51.7 kg 50.4 kg 49.3 kg    Examination: Blood pressure 140/64, pulse 93, temperature 98.1 F (36.7 C), temperature source Oral, resp. rate 19, height 5\' 2"  (1.575 m), weight 49.3 kg, SpO2 100 %. General exam: Appears calm and comfortable  HEENT:EOMI, perrl    Respiratory system: Clear to auscultation. Respiratory effort normal. Cardiovascular system: S1 & S2 heard, RRR. No JVD, murmurs, rubs, gallops or clicks. No pedal edema. Gastrointestinal system: Abdomen is nondistended, soft and nontender. No organomegaly or masses felt. Normal bowel sounds heard. Central nervous system: Alert and oriented.CN grossly intact No focal neurological deficits. Extremities: pt moving all 4 ext and ambulating. Skin: No rashes, lesions or ulcers Psychiatry: Judgement and insight appear normal. Mood & affect appropriate.   Data Reviewed: I have personally reviewed following labs and imaging studies  Labs  No results for input(s): CKTOTAL, CKMB, TROPONINI in the last 72 hours. Lab Results  Component Value Date   WBC 16.3 (H) 11/04/2020   HGB 11.2 (L) 11/05/2020   HCT 33.0 (L) 11/05/2020   MCV 94.2 11/04/2020   PLT 281 11/04/2020    Recent Labs  Lab 11/02/20 1139 11/03/20 0748 11/04/20 0924 11/04/20 0924 11/05/20 0837  NA 132*   < > 134*   < > 133*  K 3.9   < > 3.8   < > 4.0  CL 97*   < > 96*   < > 95*  CO2 24   < > 25  --   --   BUN 20   < > 22   < > 14  CREATININE 5.13*   < > 4.33*   < > 3.20*  CALCIUM 9.4   < > 9.8  --   --   PROT 5.8*  --   --   --   --   BILITOT 0.4  --   --   --   --   ALKPHOS 77  --   --   --   --   ALT 17  --   --   --   --   AST 27  --   --   --   --   GLUCOSE 155*   < > 106*   < > 107*   < > = values in this interval not displayed.   Lab Results  Component Value Date   CHOL 112 06/29/2019   HDL 32.30 (L) 06/29/2019   LDLCALC 45 06/29/2019   TRIG 172.0 (H) 06/29/2019   Lab Results  Component Value Date   DDIMER 2.66 (H) 04/09/2020   Invalid input(s): POCBNP  echocardiogram   Radiology Studies: DG Chest 1 View Result Date: 10/31/2020 CLINICAL DATA:  Status post thoracentesis EXAM: CHEST  1 VIEW COMPARISON:  July 30, 2020. FINDINGS: Left pleural effusion smaller following thoracentesis. Small pleural  effusion on each side. No pneumothorax. There is slight bibasilar atelectasis. Heart size and pulmonary vascular normal. No adenopathy. Central catheter tip is at the cavoatrial junction. There is aortic atherosclerosis. No bone lesions. IMPRESSION: Small pleural effusion currently on each side with mild bibasilar atelectasis. No pneumothorax. No consolidation. Stable cardiac silhouette.  Aortic Atherosclerosis (ICD10-I70.0). Electronically Signed   By: Lowella Grip III M.D.   On: 10/31/2020 12:21   IR THORACENTESIS ASP PLEURAL SPACE W/IMG GUIDE Result Date: 10/31/2020 INDICATION: Patient with end-stage renal disease, bilateral pleural effusions. Request is made for therapeutic thoracentesis. EXAM: ULTRASOUND GUIDED THERAPEUTIC RIGHT THORACENTESIS MEDICATIONS: 10 mL 1% lidocaine COMPLICATIONS: None immediate. PROCEDURE: An ultrasound guided thoracentesis was thoroughly discussed with the patient and questions answered. The benefits, risks, alternatives and complications were also discussed. The patient understands and wishes to proceed with the procedure. Written consent was obtained. Ultrasound was performed to localize and mark an adequate pocket of fluid in the right chest. The area was then prepped and draped in the normal sterile fashion. 1% Lidocaine was used for local anesthesia. Under ultrasound guidance a 6 Fr Safe-T-Centesis catheter was introduced. Thoracentesis was performed. The catheter was removed and a dressing applied. FINDINGS: A total of approximately 650 mL of amber fluid was removed. Samples were sent to the laboratory as requested by the clinical team. IMPRESSION: Successful ultrasound guided therapeutic right thoracentesis yielding 650 mL of pleural fluid. Read by: Brynda Greathouse PA-C Electronically Signed   By: Jerilynn Mages.  Shick M.D.   On: 10/31/2020 16:13    Anti-infectives (From admission, onward)   None       Continuous Infusions:   LOS: 13 days    Para Skeans, MD [facility] Pager [phone] How to contact the [facility] Attending or Consulting provider [place] or covering provider during after hours [place], for this patient?    1. Check the care team in [facility] and look for a) attending/consulting [facility] provider listed and b) the [facility] team listed 2. Log into [email or web address] and use [place] universal password to access. If you do not have the password, please contact the hospital operator. 3. Locate the [facility] provider you are looking for under [facility] and page to a number that you can be directly reached. 4. If you still have difficulty reaching the provider, please page the [facility] (Director on Call) for the Hospitalists listed on amion for assistance. [email or web address] Password [facility] 11/05/2020, 4:16 PM

## 2020-11-05 NOTE — Progress Notes (Signed)
PT Cancellation Note  Patient Details Name: Cassandra Dyer MRN: [id number] DOB: June 09, 1941   Cancelled Treatment:     Pt is off the floor in a procedure. Will attempt in PM, time permitting.  Lyanne Co, DPT Acute Rehabilitation Services [phone]   [name] 11/05/2020, 10:03 AM

## 2020-11-05 NOTE — Op Note (Signed)
Video Bronchoscopy with Electromagnetic Navigation and Endobronchial Ultrasound Procedure Note  Date of Operation: 11/05/2020  Pre-op Diagnosis: Left hilar mass, left upper lobe nodules, mediastinal adenopathy  Post-op Diagnosis: Same  Surgeon: Baltazar Apo  Assistants: None  Anesthesia: General endotracheal anesthesia  Operation: Flexible video fiberoptic bronchoscopy with electromagnetic navigation and biopsies.  Estimated Blood Loss: Minimal  Complications: None apparent  Indications and History: Cassandra Dyer is a 79 y.o. female with end-stage renal disease, history of renal cell cancer post nephrectomy 3 years ago.  She was found to have a left hilar mass, 2 left upper lobe pulmonary nodules and mediastinal adenopathy on chest imaging during this hospital admission.  Recommendation was made to achieve a tissue diagnosis via bronchoscopy with endobronchial ultrasound, navigation and biopsies..  The risks, benefits, complications, treatment options and expected outcomes were discussed with the patient.  The possibilities of pneumothorax, pneumonia, reaction to medication, pulmonary aspiration, perforation of a viscus, bleeding, failure to diagnose a condition and creating a complication requiring transfusion or operation were discussed with the patient who freely signed the consent.    Description of Procedure: The patient was seen in the Preoperative Area, was examined and was deemed appropriate to proceed.  The patient was taken to [facility] endoscopy room 2, identified as Cassandra Dyer and the procedure verified as Flexible Video Fiberoptic Bronchoscopy.  A Time Out was held and the above information confirmed.   Prior to the date of the procedure a high-resolution CT scan of the chest was performed. Utilizing Mabie a virtual tracheobronchial tree was generated to allow the creation of distinct navigation pathways to the patient's parenchymal abnormalities.  After being taken to the operating room general anesthesia was initiated and the patient  was orally intubated. The video fiberoptic bronchoscope was introduced via the endotracheal tube and a general inspection was performed which showed normal airways on the right.  There was raised mucosa that was irregular and producing extrinsic compression and obstruction of the left lower lobe bronchus.  Evidence for endobronchial lesion around the rim of the left upper lobe bronchus.  The endobronchial lesion was brushed and endobronchial biopsies were performed.  The extendable working channel and locator guide were introduced into the bronchoscope. The distinct navigation pathways prepared prior to this procedure were then utilized to navigate to within 0.3 cm of patient's to largest left upper lobe nodules identified on CT scan (target 1, target 2). The extendable working channel was secured into place at each nodule and the locator guide was withdrawn. Under fluoroscopic guidance transbronchial brushings, transbronchial Wang needle biopsies, and transbronchial forceps biopsies were performed at target 1 to be sent for cytology and pathology.    The standard scope was then withdrawn and the endobronchial ultrasound was used to identify and characterize the peritracheal, hilar and bronchial lymph nodes. Inspection showed a left hilar mass just proximal to the left lower lobe bronchus.  The mass encased the left pulmonary artery with pulsations evident within the mass.  Enlarged nodes were also noted at station 4L, station 7, station 4R. Using real-time ultrasound guidance Wang needle biopsies were take from the left hilar mass, Station 4R, 7, 4L nodes and were sent for cytology.   The patient tolerated the procedure well without apparent complications. There was no significant blood loss. The bronchoscope was withdrawn. Anesthesia was reversed and the patient was taken to the PACU for recovery.  A post-procedural  chest x-ray is pending.   Samples: 1. Transbronchial needle brushings  from left upper lobe nodule, target 1 2. Transbronchial Wang needle biopsies from left upper lobe nodule Target 1 3. Transbronchial forceps biopsies from left upper lobe nodule Target 1 4.  Transbronchial brushings from left upper lobe nodule Target 2 5.  Transbronchial Wang needle biopsy from left upper lobe nodule target 2 6.  Endobronchial biopsies from left lower lobe bronchus 7.  Endobronchial brushings from left lower lobe bronchus 8. Wang needle biopsies from left hilar mass 9. Wang needle biopsies from 4R node 10. Wang needle biopsies from 7 node 11. Wang needle biopsies from 4L node   Plans:  The patient will be discharged from the PACU to home when recovered from anesthesia and after chest x-ray is reviewed. We will review the cytology, pathology and microbiology results with the patient when they become available. Outpatient followup will be with Dr Lamonte Sakai.    Baltazar Apo, MD, PhD 11/05/2020, 11:42 AM [facility] [phone] or if no answer [phone]

## 2020-11-05 NOTE — Progress Notes (Signed)
Dr. Lamonte Sakai notified of heparin dose this am.

## 2020-11-05 NOTE — Interval H&P Note (Signed)
History and Physical Interval Note:  11/05/2020 7:35 AM  Cassandra Dyer  has presented today for surgery, with the diagnosis of L Hilar mass and LUL nodules.  The various methods of treatment have been discussed with the patient and family. After consideration of risks, benefits and other options for treatment, the patient has consented to  Procedure(s): VIDEO BRONCHOSCOPY WITH FLUORO (N/A) ELECTROMAGNETIC NAVIGATION BRONCHOSCOPY (N/A) ENDOBRONCHIAL ULTRASOUND (N/A) as a surgical intervention.  The patient's history has been reviewed, patient examined, no change in status, stable for surgery.  I have reviewed the patient's chart and labs.  Questions were answered to the patient's satisfaction.     Collene Gobble

## 2020-11-05 NOTE — Progress Notes (Signed)
Dr. Posey Pronto asked that I send a message to Dr. Lamonte Sakai to clarify if pt will be d/c today or tomorrow. Per Dr. Lamonte Sakai it would be best to d/c her in the am.

## 2020-11-05 NOTE — OR Nursing (Signed)
Pt is awake,alert and oriented.Pt and/or family verbalized understanding of poc and discharge instructions. Reviewed admission and on going care with receiving RN. Pt is in NAD at this time and is ready to be transferred to floor. Will con't to monitor until pt is transferred. Belongings on bed with patient Pt on 02 at 2l.min sat at 100%

## 2020-11-05 NOTE — Anesthesia Preprocedure Evaluation (Addendum)
Anesthesia Evaluation  Patient identified by MRN, date of birth, ID band Patient awake    Reviewed: Allergy & Precautions, NPO status , Patient's Chart, lab work & pertinent test results, reviewed documented beta blocker date and time   Airway Mallampati: II  TM Distance: >3 FB Neck ROM: Full    Dental  (+) Edentulous Upper, Edentulous Lower   Pulmonary COPD, former smoker,   L Hilar mass and LUL nodules   Pulmonary exam normal breath sounds clear to auscultation       Cardiovascular hypertension, Pt. on medications and Pt. on home beta blockers Normal cardiovascular exam Rhythm:Regular Rate:Normal     Neuro/Psych negative neurological ROS     GI/Hepatic negative GI ROS, (+) Hepatitis -, C  Endo/Other  negative endocrine ROS  Renal/GU ESRF and DialysisRenal disease     Musculoskeletal negative musculoskeletal ROS (+)   Abdominal   Peds  Hematology  (+) Blood dyscrasia, anemia ,   Anesthesia Other Findings Day of surgery medications reviewed with the patient.  Reproductive/Obstetrics                            Anesthesia Physical Anesthesia Plan  ASA: III  Anesthesia Plan: General   Post-op Pain Management:    Induction: Intravenous  PONV Risk Score and Plan: 3 and Dexamethasone and Ondansetron  Airway Management Planned: Oral ETT  Additional Equipment:   Intra-op Plan:   Post-operative Plan: Extubation in OR  Informed Consent: I have reviewed the patients History and Physical, chart, labs and discussed the procedure including the risks, benefits and alternatives for the proposed anesthesia with the patient or authorized representative who has indicated his/her understanding and acceptance.       Plan Discussed with: CRNA  Anesthesia Plan Comments:         Anesthesia Quick Evaluation

## 2020-11-06 DIAGNOSIS — R918 Other nonspecific abnormal finding of lung field: Secondary | ICD-10-CM | POA: Diagnosis not present

## 2020-11-06 LAB — CBC
HCT: 31.6 % — ABNORMAL LOW (ref 36.0–46.0)
Hemoglobin: 9.7 g/dL — ABNORMAL LOW (ref 12.0–15.0)
MCH: 28.6 pg (ref 26.0–34.0)
MCHC: 30.7 g/dL (ref 30.0–36.0)
MCV: 93.2 fL (ref 80.0–100.0)
Platelets: 243 10*3/uL (ref 150–400)
RBC: 3.39 MIL/uL — ABNORMAL LOW (ref 3.87–5.11)
RDW: 16.3 % — ABNORMAL HIGH (ref 11.5–15.5)
WBC: 25.2 10*3/uL — ABNORMAL HIGH (ref 4.0–10.5)
nRBC: 0 % (ref 0.0–0.2)

## 2020-11-06 LAB — COMPREHENSIVE METABOLIC PANEL
ALT: 17 U/L (ref 0–44)
AST: 26 U/L (ref 15–41)
Albumin: 1.9 g/dL — ABNORMAL LOW (ref 3.5–5.0)
Alkaline Phosphatase: 107 U/L (ref 38–126)
Anion gap: 12 (ref 5–15)
BUN: 32 mg/dL — ABNORMAL HIGH (ref 8–23)
CO2: 24 mmol/L (ref 22–32)
Calcium: 9.4 mg/dL (ref 8.9–10.3)
Chloride: 94 mmol/L — ABNORMAL LOW (ref 98–111)
Creatinine, Ser: 4.94 mg/dL — ABNORMAL HIGH (ref 0.44–1.00)
GFR, Estimated: 9 mL/min — ABNORMAL LOW (ref >60)
Glucose, Bld: 129 mg/dL — ABNORMAL HIGH (ref 70–99)
Potassium: 3.5 mmol/L (ref 3.5–5.1)
Sodium: 130 mmol/L — ABNORMAL LOW (ref 135–145)
Total Bilirubin: 0.7 mg/dL (ref 0.3–1.2)
Total Protein: 5.5 g/dL — ABNORMAL LOW (ref 6.5–8.1)

## 2020-11-06 MED ORDER — HEPARIN SODIUM (PORCINE) 1000 UNIT/ML IJ SOLN
INTRAMUSCULAR | Status: AC
  Filled 2020-11-06: qty 4

## 2020-11-06 NOTE — Anesthesia Postprocedure Evaluation (Signed)
Anesthesia Post Note  Patient: [name]  Procedure(s) Performed: VIDEO BRONCHOSCOPY WITH FLUORO (N/A ) ELECTROMAGNETIC NAVIGATION BRONCHOSCOPY (N/A ) ENDOBRONCHIAL ULTRASOUND (N/A ) BRONCHIAL BIOPSIES BRONCHIAL BRUSHINGS BRONCHIAL NEEDLE ASPIRATION BIOPSIES FINE NEEDLE ASPIRATION (FNA) LINEAR     Patient location during evaluation: Other Anesthesia Type: General Level of consciousness: awake and alert Pain management: pain level controlled Vital Signs Assessment: post-procedure vital signs reviewed and stable Respiratory status: spontaneous breathing, nonlabored ventilation and respiratory function stable Cardiovascular status: blood pressure returned to baseline and stable Postop Assessment: no apparent nausea or vomiting Anesthetic complications: no   No complications documented.  Last Vitals:  Vitals:   11/06/20 0006 11/06/20 0516  BP: (!) 179/74 (!) 155/63  Pulse: 96 97  Resp: 18 18  Temp: 36.6 C 37.3 C  SpO2: 100% 100%    Last Pain:  Vitals:   11/06/20 0516  TempSrc: Oral  PainSc:                  Waqas Bruhl,W. EDMOND

## 2020-11-06 NOTE — TOC Progression Note (Signed)
Transition of Care [facility]) - Progression Note    Patient Details  Name: Cassandra Dyer MRN: [id number] Date of Birth: 06/28/1941  [facility] CM/SW Contact  Pristine Gladhill, Edson Snowball, RN Phone Number: 11/06/2020, 12:52 PM  Clinical Narrative:     Home health arranged with [facility] . Hoyle Sauer with [facility] aware of discharge today.   Ordered 3 in1 with Freda Munro with [place].  Expected Discharge Plan: [place] Barriers to Discharge: Continued Medical Work up  Expected Discharge Plan and Services Expected Discharge Plan: [place]   Discharge Planning Services: CM Consult Post Acute Care Choice: [place] arrangements for the past 2 months: Single Family Home Expected Discharge Date: 11/06/20               DME Arranged: N/A (patient does not want rollator at present) DME Agency: NA       HH Arranged: PT [place] Agency: Other - See comment ([place]) Date [place]: 10/23/20 Time [place]: 1154 Representative spoke with at [place]: [place] ([place]) Interventions    Readmission Risk Interventions Readmission Risk Prevention Plan 08/08/2020  Transportation Screening Complete  Medication Review Press photographer) Complete  PCP or Specialist appointment within 3-5 days of discharge Complete  HRI or [place] Complete  SW Recovery Care/Counseling Consult Complete  [place] Not Applicable  Some recent data might be hidden

## 2020-11-06 NOTE — Discharge Instructions (Signed)
Lung Mass  A lung mass is a growth in the lung that is larger than 3 centimeters (1.2 inches). Smaller growths are called nodules. Most lung nodules are not cancer. Lung masses have a higher risk of being cancer. A lung mass is sometimes found during a routine chest X-ray or while doing other imaging tests to check for other problems. What are common types of lung masses? Lung masses include:  Tumors. These may be cancerous (malignant) or noncancerous (benign).  Infectious masses (granulomas). These are masses caused by inflammation from bacterial infections, like tuberculosis, or fungal infections.  Noninfectious masses. Some diseases that cause lung inflammation may also cause lung masses to form.  Blood vessel malformations. What type of testing may be needed? Your health care provider may recommend that you have tests to diagnose the cause of your lung mass. The following tests may be done if a lung mass is found:  Physical exam.  Imaging tests, such as: ? Chest X-rays. ? CT scan. ? PET scan. This scan measures how much energy a mass is using.  Biopsy to rule out cancer or confirm a diagnosis. This procedure involves removing a tissue sample from the mass with a needle inserted through the chest, using a scope placed down into the lung, or through open surgery. Tests and physical exams may be done once, or they may be done regularly for a period of time. Tests and exams that are done regularly will help monitor whether the mass or tissue change is growing and becoming a concern. What are common treatments? Treatment for a lung mass depends on the cause. Noncancerous masses may require treatment specific to the cause. Treatment options for a cancerous lung mass may include:  Surgical removal.  Radiation therapy.  Chemotherapy. Follow these instructions at home: If you have had surgery or a biopsy, your health care provider will give you specific instructions for taking care of  yourself at home after your procedure. Follow these instructions carefully. General home care instructions include:  Take over-the-counter and prescription medicines only as told by your health care provider.  Return to your normal activities as told by your health care provider. Ask your health care provider what activities are safe for you.  Do not use any products that contain nicotine or tobacco, such as cigarettes, e-cigarettes, and chewing tobacco. If you need help quitting, ask your health care provider.  Keep all follow-up visits as told by your health care provider. This is important. Contact a health care provider if you:  Have pain in your chest, back, or shoulder.  Are short of breath.  Have a cough.  Cough up blood or bloody sputum. Summary  A lung mass is a growth in the lung that is larger than 3 centimeters (1.2 inches).  Lung masses have a higher risk of being cancer than do smaller growths.  Sometimes a lung mass is found during a routine chest X-ray or other imaging test.  Your health care provider may do a biopsy of the lung mass to rule out or confirm cancer.  Treatment for this condition depends on the cause. This information is not intended to replace advice given to you by your health care provider. Make sure you discuss any questions you have with your health care provider. Document Revised: 03/24/2019 Document Reviewed: 07/26/2018 [employer] Patient Education  [place].

## 2020-11-06 NOTE — Discharge Summary (Signed)
Physician Discharge Summary  Cassandra Dyer BOF:[id number] DOB: 06-04-1941 DOA: 10/21/2020  PCP: Biagio Borg, MD  Admit date: 10/21/2020 Discharge date: 11/06/2020  Admitted From: Home Disposition: Home  Recommendations for Outpatient Follow-up:  1. Follow up with PCP in 1-2 weeks 2. Follow-up with nephrology for routine dialysis 3. Follow-up with pulmonary for results of the bronchoscopy/cytology 4. Please obtain BMP/CBC in one week 5. Please follow up with your PCP on the following pending results: Unresulted Labs (From admission, onward)         None       Home Health: Yes Equipment/Devices: None  Discharge Condition: Stable CODE STATUS: Full code Diet recommendation: Renal  Subjective: Seen and examined in the dialysis unit.  Denies having any complaint.  No shortness of breath.  On 2 L of oxygen which is her baseline.  Brief/Interim Summary: Cassandra Hirt [name] a 79 year old female with past medical history significant for ESRD on HD, history of renal cell carcinoma right kidney status post nephrectomy, essential hypertension, anemia of chronic kidney disease who presented to the ED with complaint of shortness of breath, weakness. Missed hemodialysis on Monday. Baseline oxygen requirement of 2 L but was admitted with 4 L.  In the ED, chest x-ray notable for bilateral pleural effusions and interstitial edema. EDP consulted hospitalist group for admission secondary to acute on chronic hypoxic respiratory failure secondary to volume overload/pulmonary edema/pleural effusion.  Acute on chronic hypoxic respiratory failure 2/2 pulmonary edema and bilateral pleural effusions COPD underwent left thoracentesis 11/11, right thoracentesis on 11/12 and then another left thoracentesis on 11/18.  Pleural fluid from the initial tap was transudative TTE with LVEF 77-82%, grade 2 diastolic dysfunction, mild aortic valve sclerosis without stenosis, normal IVC.  To persistent hypoxemia,  CT chest was done which showed left hilar mass.  She also had lost 40 pounds.  Pulmonary was consulted.  She underwent bronchoscopy with endobronchial ultrasound on 11/05/2020.  Results of cytology and all the labs are still pending.  Pulmonary has cleared the patient for discharge.  Patient is back to 2 L of oxygen which is her baseline.  Pulmonary will follow up with her with the results.   Essential hypertension Controlled.  Resume home medications.  UMP:NTIRWE home medications.  Anemia of chronic kidney disease Hemoglobin remained stable.  Discharge Diagnoses:  Principal Problem:   Mass of upper lobe of left lung Active Problems:   Essential hypertension   Renal cell carcinoma of right kidney (HCC)   ESRD (end stage renal disease) (HCC)   COPD (chronic obstructive pulmonary disease) (HCC)   Pulmonary edema   Acute pulmonary edema (HCC)   Acute respiratory failure with hypoxia (HCC)   Mediastinal adenopathy   Pulmonary nodules    Discharge Instructions   Allergies as of 11/06/2020   No Known Allergies     Medication List    STOP taking these medications   benzonatate 200 MG capsule Commonly known as: TESSALON     TAKE these medications   amLODipine 10 MG tablet Commonly known as: NORVASC Take 1 tablet (10 mg total) by mouth daily.   ascorbic acid 500 MG tablet Commonly known as: VITAMIN C Take 500 mg by mouth 2 (two) times daily.   Black Currant Seed Oil 500 MG Caps Take 500 mg by mouth every morning.   CALCIUM 1200 PO Take 1,200 mg by mouth every morning.   carvedilol 12.5 MG tablet Commonly known as: COREG Take 1 tablet (12.5 mg total) by mouth 2 (  two) times daily with a meal.   FISH OIL PO Take 1 capsule by mouth every morning.   Garlic 3818 MG Caps Take 2,000 mg by mouth daily.   metoprolol succinate 100 MG 24 hr tablet Commonly known as: TOPROL-XL Take 100 mg by mouth daily.   multivitamin with minerals Tabs tablet Take 1 tablet by  mouth every morning.   ondansetron 4 MG tablet Commonly known as: Zofran Take 1 tablet (4 mg total) by mouth every 8 (eight) hours as needed for nausea or vomiting.   OVER THE COUNTER MEDICATION Take 1 capsule by mouth daily. Mega juice   polyethylene glycol 17 g packet Commonly known as: MIRALAX / GLYCOLAX Take 17 g by mouth daily as needed for mild constipation.   rosuvastatin 10 MG tablet Commonly known as: CRESTOR Take 1 tablet (10 mg total) by mouth daily.   senna-docusate 8.6-50 MG tablet Commonly known as: Senokot-S Take 1 tablet by mouth daily as needed for mild constipation.   telmisartan 80 MG tablet Commonly known as: MICARDIS Take 80 mg by mouth daily.   Vitamin D3 50 MCG (2000 UT) capsule Take 2,000 Units by mouth every morning.            Durable Medical Equipment  (From admission, onward)         Start     Ordered   10/23/20 1602  For home use only DME 3 n 1  Once        10/23/20 1601          Follow-up [facility] up.   Why: [place] information: [address] [place] [address] [phone]        Biagio Borg, MD Follow up in 1 week(s).   Specialties: Internal Medicine, Radiology Contact information: [address] [place] [address] [phone]              No Known Allergies  Consultations: IR, nephrology and pulmonology   Procedures/Studies: DG Chest 1 View  Result Date: 10/31/2020 CLINICAL DATA:  Status post thoracentesis EXAM: CHEST  1 VIEW COMPARISON:  July 30, 2020. FINDINGS: Left pleural effusion smaller following thoracentesis. Small pleural effusion on each side. No pneumothorax. There is slight bibasilar atelectasis. Heart size and pulmonary vascular normal. No adenopathy. Central catheter tip is at the cavoatrial junction. There is aortic atherosclerosis. No bone lesions. IMPRESSION: Small pleural effusion currently on each side with  mild bibasilar atelectasis. No pneumothorax. No consolidation. Stable cardiac silhouette. Aortic Atherosclerosis (ICD10-I70.0). Electronically Signed   By: Lowella Grip III M.D.   On: 10/31/2020 12:21   DG Chest 1 View  Result Date: 10/25/2020 CLINICAL DATA:  79 year old female with a history of thoracentesis EXAM: CHEST  1 VIEW COMPARISON:  10/24/2020, 10/23/2020 FINDINGS: Cardiomediastinal silhouette unchanged in size and contour with the heart borders partially obscured by overlying lung and pleural disease. Decreasing interlobular septal thickening. Unchanged right IJ tunneled hemodialysis catheter. Improved opacity at the right lung base with minimal blunting at the right costophrenic angle. No pneumothorax. Opacity at the left lung base is increased from the most recent comparison, though not as severe as the study dated 10/23/2020. No pneumothorax on the left. IMPRESSION: Decreased right-sided pleural effusion with no pneumothorax status post right thoracentesis. Recurrent left-sided pleural effusion and associated atelectasis/consolidation. Unchanged right IJ tunneled hemodialysis catheter. Improving pulmonary edema. Electronically Signed   By: Corrie Mckusick D.O.  On: 10/25/2020 16:40   DG Chest 1 View  Result Date: 10/24/2020 CLINICAL DATA:  Post thoracentesis today EXAM: CHEST  1 VIEW COMPARISON:  10/23/2020 FINDINGS: Significant improvement in bilateral pleural effusions with near complete clearing of left pleural fluid and small residual right pleural effusion. Improved aeration in both lung bases persistent right lower lobe atelectasis. Negative for pneumothorax. Improvement in vascular congestion and edema. Dialysis catheter in the lower SVC unchanged. IMPRESSION: Improvement in bilateral pleural effusions and bilateral edema. No pneumothorax post thoracentesis. Electronically Signed   By: Franchot Gallo M.D.   On: 10/24/2020 11:21   DG Chest 2 View  Result Date: 10/21/2020 CLINICAL  DATA:  79 year old female with shortness of breath and increased weakness. Missed hemodialysis today. EXAM: CHEST - 2 VIEW COMPARISON:  Portable chest 09/10/2020 and earlier. FINDINGS: AP and lateral views of the chest today. Stable right chest tunneled dialysis catheter. Moderate bilateral pleural effusions are increased since September, resembling similar effusions in August. Upper lung vascular congestion. Most mediastinal contours are obscured today. Visualized tracheal air column is within normal limits. No pneumothorax. No acute osseous abnormality identified. Abdominal Calcified aortic atherosclerosis. Paucity of bowel gas in the upper abdomen. IMPRESSION: Moderate bilateral pleural effusions are increased since September, with superimposed mild or developing pulmonary interstitial edema. Electronically Signed   By: Genevie Ann M.D.   On: 10/21/2020 18:31   CT ANGIO CHEST PE W OR WO CONTRAST  Result Date: 11/02/2020 CLINICAL DATA:  Respiratory failure EXAM: CT ANGIOGRAPHY CHEST WITH CONTRAST TECHNIQUE: Multidetector CT imaging of the chest was performed using the standard protocol during bolus administration of intravenous contrast. Multiplanar CT image reconstructions and MIPs were obtained to evaluate the vascular anatomy. CONTRAST:  48mL OMNIPAQUE IOHEXOL 350 MG/ML SOLN COMPARISON:  CT abdomen pelvis, 04/08/2020, 08/05/2017 FINDINGS: Cardiovascular: Satisfactory opacification of the pulmonary arteries. No evidence of pulmonary embolism. The left lower lobar pulmonary arteries are nearly occluded by a left hilar mass. Normal heart size. Left coronary artery calcifications. No pericardial effusion. Aortic atherosclerosis. Mediastinum/Nodes: Numerous enlarged left hilar and mediastinal lymph nodes, with a left hilar mass. Largest discretely measurable lymph nodes measure up to 2.1 x 1.4 cm (series 8, image 56). Thyroid gland, trachea, and esophagus demonstrate no significant findings. Lungs/Pleura: Mild  centrilobular emphysema. Small to moderate, left greater than right pleural effusions and associated atelectasis or consolidation. There is a left hilar mass measuring approximately 5.2 x 4.0 cm, which obstructs the left lower lobar bronchus near the origin (series 9, image 208). There is a subpleural nodule of the posterior left apex measuring 1.4 x 1.3 cm (series 8, image 31) and an additional subpleural nodule of the high medial apex measuring 0.9 x 0.6 cm (series 8, image 25). Upper Abdomen: Partially imaged left adrenal nodule, not obviously changed compared to prior examination dated 04/08/2020 measuring 1.5 x 1.0 cm (series 9, image 39). Musculoskeletal: No chest wall abnormality. No acute or significant osseous findings. Review of the MIP images confirms the above findings. IMPRESSION: 1. Negative examination for pulmonary embolism. 2. There is a left hilar mass measuring approximately 5.2 x 4.0 cm, which obstructs the left lower lobar bronchus near the origin as well as the left lower lobar pulmonary arteries. 3. Subpleural nodules of the left pulmonary apex. 4. Numerous enlarged left hilar and mediastinal lymph nodes. 5. Constellation of findings is consistent primary lung malignancy and nodal and left-sided pulmonary metastatic disease. 6. Small to moderate, left greater than right pleural effusions and associated atelectasis or  consolidation. Left pleural effusion is presumed malignant given presence of subpleural pulmonary nodules, there is no direct evidence of right sided malignant effusion or metastatic disease. 7. Partially imaged left adrenal nodule, not obviously changed compared to prior examinations and previously better characterized as a benign adenoma. Aortic Atherosclerosis (ICD10-I70.0) and Emphysema (ICD10-J43.9). Electronically Signed   By: Eddie Candle M.D.   On: 11/02/2020 19:11   DG Chest Port 1 View  Result Date: 11/05/2020 CLINICAL DATA:  Patient status post left bronchoscopy  today. EXAM: PORTABLE CHEST 1 VIEW COMPARISON:  Single-view of the chest 11/01/2019. FINDINGS: Negative for pneumothorax after bronchoscopy. Small bilateral pleural effusions and basilar atelectasis are worse on the left. Heart size is normal. Aortic atherosclerosis. Dialysis catheter is unchanged. IMPRESSION: Negative for pneumothorax after bronchoscopy. Small bilateral pleural effusions and basilar airspace disease, worse on the left. Electronically Signed   By: Inge Rise M.D.   On: 11/05/2020 12:17   DG CHEST PORT 1 VIEW  Result Date: 10/30/2020 CLINICAL DATA:  Shortness of breath EXAM: PORTABLE CHEST 1 VIEW COMPARISON:  October 28, 2020 FINDINGS: Central catheter tip is at the cavoatrial junction. No pneumothorax. There are pleural effusions bilaterally with bibasilar atelectasis. Heart is mildly enlarged with pulmonary vascularity normal. No adenopathy. There is aortic atherosclerosis. No bone lesions. IMPRESSION: Central catheter as described without pneumothorax. Pleural effusions bilaterally with bibasilar atelectasis. It should be noted that superimposed pneumonia in the bases cannot be excluded by radiography. No new opacity appreciable. Stable cardiac silhouette. Aortic Atherosclerosis (ICD10-I70.0). Electronically Signed   By: Lowella Grip III M.D.   On: 10/30/2020 08:15   DG CHEST PORT 1 VIEW  Result Date: 10/28/2020 CLINICAL DATA:  Dyspnea, end-stage renal disease EXAM: PORTABLE CHEST 1 VIEW COMPARISON:  10/25/2020 FINDINGS: Right internal jugular hemodialysis catheter is seen with its tip at the superior right atrium. Pulmonary insufflation is normal. Large left and small right pleural effusions are present and have enlarged in size since prior examination. Superimposed mild interstitial pulmonary edema has progressed in the interval since prior examination. No pneumothorax. Cardiac size is at the upper limits of normal. No acute bone abnormality. IMPRESSION: Progressive  cardiogenic failure or volume overload with progressive interstitial pulmonary infiltrate and enlarging bilateral pleural effusions, left greater than right. Electronically Signed   By: Fidela Salisbury MD   On: 10/28/2020 06:42   DG CHEST PORT 1 VIEW  Result Date: 10/23/2020 CLINICAL DATA:  Shortness of breath. EXAM: PORTABLE CHEST 1 VIEW COMPARISON:  10/21/2020 FINDINGS: The heart is enlarged but stable. The right IJ dialysis catheter is stable. Interval development of diffuse interstitial pulmonary edema. Persistent bilateral pleural effusions. No pneumothorax. IMPRESSION: Interval development of diffuse interstitial pulmonary edema. Persistent pleural effusions. Electronically Signed   By: Marijo Sanes M.D.   On: 10/23/2020 07:15   VAS US DUPLEX DIALYSIS ACCESS (AVF,AVG)  Result Date: 10/18/2020 DIALYSIS ACCESS Reason for Exam: Routine follow up. Access Site: Left Upper Extremity. Access Type: Brachial-cephalic AVF (01/77/9390). Performing Technologist: Ivan Croft  Examination Guidelines: A complete evaluation includes B-mode imaging, spectral Doppler, color Doppler, and power Doppler as needed of all accessible portions of each vessel. Unilateral testing is considered an integral part of a complete examination. Limited examinations for reoccurring indications may be performed as noted.  Findings: +--------------------+----------+-----------------+--------+ AVF                 PSV (cm/s)Flow Vol (mL/min)Comments +--------------------+----------+-----------------+--------+ Native artery inflow   205  788                +--------------------+----------+-----------------+--------+ AVF Anastomosis        823                              +--------------------+----------+-----------------+--------+  +------------+----------+-------------+----------+--------+ OUTFLOW VEINPSV (cm/s)Diameter (cm)Depth (cm)Describe +------------+----------+-------------+----------+--------+  Shoulder       224        0.58        0.83            +------------+----------+-------------+----------+--------+ Prox UA        158        0.61        0.48            +------------+----------+-------------+----------+--------+ Mid UA         158        0.73        0.34    branch  +------------+----------+-------------+----------+--------+ Dist UA        116        0.86        0.31            +------------+----------+-------------+----------+--------+  Diagnosing physician: Servando Snare MD Electronically signed by Servando Snare MD on 10/18/2020 at 10:15:31 AM.   --------------------------------------------------------------------------------   Final    ECHOCARDIOGRAM COMPLETE  Result Date: 10/29/2020    ECHOCARDIOGRAM REPORT   Patient Name:   Cassandra Dyer Date of Exam: 10/29/2020 Medical Rec #:  [id number]       Height:       62.0 in Accession #:    [id number]      Weight:       121.3 lb Date of Birth:  Apr 18, 1941       BSA:          1.545 m Patient Age:    51 years        BP:           113/56 mmHg Patient Gender: F               HR:           83 bpm. Exam Location:  Inpatient Procedure: 2D Echo, Cardiac Doppler and Color Doppler Indications:    Fluid Overload  History:        Patient has no prior history of Echocardiogram examinations.                 Risk Factors:Hypertension and Dyslipidemia.  Sonographer:    Bernadene Person RDCS Referring Phys: [id number] [place]  1. Left ventricular ejection fraction, by estimation, is 60 to 65%. The left ventricle has normal function. The left ventricle has no regional wall motion abnormalities. Left ventricular diastolic parameters are consistent with Grade II diastolic dysfunction (pseudonormalization).  2. Right ventricular systolic function is normal. The right ventricular size is mildly enlarged. There is moderately elevated pulmonary artery systolic pressure. The estimated right ventricular systolic pressure is 40.0 mmHg.  3. The  mitral valve is grossly normal. No evidence of mitral valve regurgitation. No evidence of mitral stenosis.  4. The aortic valve is abnormal. There is mild calcification of the aortic valve. Aortic valve regurgitation is not visualized. Mild aortic valve sclerosis is present, with no evidence of aortic valve stenosis.  5. The inferior vena cava is normal in size with greater than 50% respiratory variability, suggesting right atrial pressure of 3 mmHg. FINDINGS  Left Ventricle: Left ventricular ejection fraction, by estimation, is 60 to 65%. The left ventricle has normal function. The left ventricle has no regional wall motion abnormalities. The left ventricular internal cavity size was normal in size. There is  no left ventricular hypertrophy. Left ventricular diastolic parameters are consistent with Grade II diastolic dysfunction (pseudonormalization). Right Ventricle: The right ventricular size is mildly enlarged. No increase in right ventricular wall thickness. Right ventricular systolic function is normal. There is moderately elevated pulmonary artery systolic pressure. The tricuspid regurgitant velocity is 3.50 m/s, and with an assumed right atrial pressure of 3 mmHg, the estimated right ventricular systolic pressure is 19.1 mmHg. Left Atrium: Left atrial size was normal in size. Right Atrium: Right atrial size was normal in size. Pericardium: There is no evidence of pericardial effusion. Mitral Valve: The mitral valve is grossly normal. No evidence of mitral valve regurgitation. No evidence of mitral valve stenosis. Tricuspid Valve: The tricuspid valve is normal in structure. Tricuspid valve regurgitation is mild . No evidence of tricuspid stenosis. Aortic Valve: The aortic valve is abnormal. There is mild calcification of the aortic valve. Aortic valve regurgitation is not visualized. Mild aortic valve sclerosis is present, with no evidence of aortic valve stenosis. Pulmonic Valve: The pulmonic valve was  grossly normal. Pulmonic valve regurgitation is mild to moderate. No evidence of pulmonic stenosis. Aorta: The aortic root is normal in size and structure. Venous: The inferior vena cava is normal in size with greater than 50% respiratory variability, suggesting right atrial pressure of 3 mmHg. IAS/Shunts: No atrial level shunt detected by color flow Doppler. Additional Comments: A venous catheter is visualized in the right atrium. There is a moderate pleural effusion.  LEFT VENTRICLE PLAX 2D LVIDd:         4.17 cm  Diastology LVIDs:         2.21 cm  LV e' medial:    4.90 cm/s LV PW:         0.59 cm  LV E/e' medial:  22.0 LV IVS:        0.64 cm  LV e' lateral:   8.05 cm/s LVOT diam:     2.10 cm  LV E/e' lateral: 13.4 LV SV:         90 LV SV Index:   58 LVOT Area:     3.46 cm  RIGHT VENTRICLE TAPSE (M-mode): 1.9 cm LEFT ATRIUM             Index       RIGHT ATRIUM           Index LA diam:        3.70 cm 2.39 cm/m  RA Area:     14.50 cm LA Vol (A2C):   49.5 ml 32.03 ml/m RA Volume:   36.30 ml  23.49 ml/m LA Vol (A4C):   46.9 ml 30.35 ml/m LA Biplane Vol: 50.1 ml 32.42 ml/m  AORTIC VALVE LVOT Vmax:   145.00 cm/s LVOT Vmean:  93.500 cm/s LVOT VTI:    0.259 m  AORTA Ao Root diam: 2.70 cm Ao Asc diam:  2.10 cm MITRAL VALVE                TRICUSPID VALVE MV Area (PHT): 2.91 cm     TR Peak grad:   49.0 mmHg MV Decel Time: 261 msec     TR Vmax:        350.00 cm/s MV E velocity: 108.00 cm/s MV A velocity: 124.00  cm/s  SHUNTS MV E/A ratio:  0.87         Systemic VTI:  0.26 m                             Systemic Diam: 2.10 cm Cherlynn Kaiser MD Electronically signed by Cherlynn Kaiser MD Signature Date/Time: 10/29/2020/4:59:56 PM    Final    IR THORACENTESIS ASP PLEURAL SPACE W/IMG GUIDE  Result Date: 10/31/2020 INDICATION: Patient with end-stage renal disease, bilateral pleural effusions. Request is made for therapeutic thoracentesis. EXAM: ULTRASOUND GUIDED THERAPEUTIC RIGHT THORACENTESIS MEDICATIONS: 10 mL 1%  lidocaine COMPLICATIONS: None immediate. PROCEDURE: An ultrasound guided thoracentesis was thoroughly discussed with the patient and questions answered. The benefits, risks, alternatives and complications were also discussed. The patient understands and wishes to proceed with the procedure. Written consent was obtained. Ultrasound was performed to localize and mark an adequate pocket of fluid in the right chest. The area was then prepped and draped in the normal sterile fashion. 1% Lidocaine was used for local anesthesia. Under ultrasound guidance a 6 Fr Safe-T-Centesis catheter was introduced. Thoracentesis was performed. The catheter was removed and a dressing applied. FINDINGS: A total of approximately 650 mL of amber fluid was removed. Samples were sent to the laboratory as requested by the clinical team. IMPRESSION: Successful ultrasound guided therapeutic right thoracentesis yielding 650 mL of pleural fluid. Read by: Brynda Greathouse PA-C Electronically Signed   By: Jerilynn Mages.  Shick M.D.   On: 10/31/2020 16:13   IR THORACENTESIS ASP PLEURAL SPACE W/IMG GUIDE  Result Date: 10/25/2020 INDICATION: Shortness of breath. Right-sided pleural effusion. Request for therapeutic thoracentesis. EXAM: ULTRASOUND GUIDED RIGHT THORACENTESIS MEDICATIONS: 1% plain lidocaine, 5 mL COMPLICATIONS: None immediate. PROCEDURE: An ultrasound guided thoracentesis was thoroughly discussed with the patient and questions answered. The benefits, risks, alternatives and complications were also discussed. The patient understands and wishes to proceed with the procedure. Written consent was obtained. Ultrasound was performed to localize and mark an adequate pocket of fluid in the right chest. The area was then prepped and draped in the normal sterile fashion. 1% Lidocaine was used for local anesthesia. Under ultrasound guidance a 6 Fr Safe-T-Centesis catheter was introduced. Thoracentesis was performed. The catheter was removed and a dressing  applied. FINDINGS: A total of approximately 700 mL of clear yellow fluid was removed. IMPRESSION: Successful ultrasound guided right thoracentesis yielding 700 mL of pleural fluid. Read by: Ascencion Dike PA-C Electronically Signed   By: Aletta Edouard M.D.   On: 10/25/2020 16:35   IR THORACENTESIS ASP PLEURAL SPACE W/IMG GUIDE  Result Date: 10/24/2020 INDICATION: Patient with ESRD in fluid overload, acute pulmonary edema, dyspnea, and bilateral pleural effusions, L>R. Request is made for diagnostic and therapeutic left thoracentesis. EXAM: ULTRASOUND GUIDED DIAGNOSTIC AND THERAPEUTIC LEFT THORACENTESIS MEDICATIONS: 10 mL 1% lidocaine COMPLICATIONS: None immediate. PROCEDURE: An ultrasound guided thoracentesis was thoroughly discussed with the patient and questions answered. The benefits, risks, alternatives and complications were also discussed. The patient understands and wishes to proceed with the procedure. Written consent was obtained. Ultrasound was performed to localize and mark an adequate pocket of fluid in the left chest. The area was then prepped and draped in the normal sterile fashion. 1% Lidocaine was used for local anesthesia. Under ultrasound guidance a 6 Fr Safe-T-Centesis catheter was introduced. Thoracentesis was performed. The catheter was removed and a dressing applied. FINDINGS: A total of approximately 1 L of clear gold fluid was removed. Samples  were sent to the laboratory as requested by the clinical team. IMPRESSION: Successful ultrasound guided left thoracentesis yielding 1 L of pleural fluid. Read by: Earley Abide, PA-C Electronically Signed   By: Ruthann Cancer MD   On: 10/24/2020 11:54   DG C-ARM BRONCHOSCOPY  Result Date: 11/05/2020 C-ARM BRONCHOSCOPY: Fluoroscopy was utilized by the requesting physician.  No radiographic interpretation.      Discharge Exam: Vitals:   11/06/20 1055 11/06/20 1100  BP: 123/61 (!) 154/67  Pulse:    Resp: (!) 30 (!) 27  Temp:  99.3 F  (37.4 C)  SpO2:     Vitals:   11/06/20 0955 11/06/20 1010 11/06/20 1055 11/06/20 1100  BP: 138/67 123/64 123/61 (!) 154/67  Pulse:      Resp: (!) 27 18 (!) 30 (!) 27  Temp:    99.3 F (37.4 C)  TempSrc:    Oral  SpO2:      Weight:    (P) 48.2 kg  Height:        General: Pt is alert, awake, not in acute distress Cardiovascular: RRR, S1/S2 +, no rubs, no gallops Respiratory: CTA bilaterally, no wheezing, no rhonchi Abdominal: Soft, NT, ND, bowel sounds + Extremities: no edema, no cyanosis    The results of significant diagnostics from this hospitalization (including imaging, microbiology, ancillary and laboratory) are listed below for reference.     Microbiology: No results found for this or any previous visit (from the past 240 hour(s)).   Labs: BNP (last 3 results) No results for input(s): BNP in the last 8760 hours. Basic Metabolic Panel: Recent Labs  Lab 11/01/20 0123 11/01/20 0123 11/02/20 1139 11/03/20 0748 11/04/20 0924 11/05/20 0837 11/06/20 0333  NA 133*   < > 132* 129* 134* 133* 130*  K 3.6   < > 3.9 3.5 3.8 4.0 3.5  CL 96*   < > 97* 93* 96* 95* 94*  CO2 27  --  24 23 25   --  24  GLUCOSE 91   < > 155* 90 106* 107* 129*  BUN 34*   < > 20 35* 22 14 32*  CREATININE 6.40*   < > 5.13* 6.61* 4.33* 3.20* 4.94*  CALCIUM 9.0  --  9.4 9.0 9.8  --  9.4  MG  --   --   --   --  1.9  --   --   PHOS  --   --   --  4.8*  --   --   --    < > = values in this interval not displayed.   Liver Function Tests: Recent Labs  Lab 11/02/20 1139 11/03/20 0748 11/06/20 0333  AST 27  --  26  ALT 17  --  17  ALKPHOS 77  --  107  BILITOT 0.4  --  0.7  PROT 5.8*  --  5.5*  ALBUMIN 1.9* 1.7* 1.9*   No results for input(s): LIPASE, AMYLASE in the last 168 hours. No results for input(s): AMMONIA in the last 168 hours. CBC: Recent Labs  Lab 11/01/20 0123 11/01/20 0123 11/02/20 1139 11/03/20 0748 11/04/20 0924 11/05/20 0837 11/06/20 0333  WBC 14.5*  --  15.3* 15.4*  16.3*  --  25.2*  HGB 9.2*   < > 10.0* 9.2* 10.4* 11.2* 9.7*  HCT 27.8*   < > 31.0* 28.5* 32.7* 33.0* 31.6*  MCV 96.2  --  95.7 96.3 94.2  --  93.2  PLT 248  --  226 274  281  --  243   < > = values in this interval not displayed.   Cardiac Enzymes: No results for input(s): CKTOTAL, CKMB, CKMBINDEX, TROPONINI in the last 168 hours. BNP: Invalid input(s): POCBNP CBG: No results for input(s): GLUCAP in the last 168 hours. D-Dimer No results for input(s): DDIMER in the last 72 hours. Hgb A1c No results for input(s): HGBA1C in the last 72 hours. Lipid Profile No results for input(s): CHOL, HDL, LDLCALC, TRIG, CHOLHDL, LDLDIRECT in the last 72 hours. Thyroid function studies No results for input(s): TSH, T4TOTAL, T3FREE, THYROIDAB in the last 72 hours.  Invalid input(s): FREET3 Anemia work up No results for input(s): VITAMINB12, FOLATE, FERRITIN, TIBC, IRON, RETICCTPCT in the last 72 hours. Urinalysis    Component Value Date/Time   COLORURINE YELLOW 07/28/2020 1131   APPEARANCEUR HAZY (A) 07/28/2020 1131   LABSPEC 1.016 07/28/2020 1131   PHURINE 5.0 07/28/2020 1131   GLUCOSEU NEGATIVE 07/28/2020 1131   GLUCOSEU NEGATIVE 04/26/2020 0909   HGBUR NEGATIVE 07/28/2020 1131   [place] 07/28/2020 1131   KETONESUR NEGATIVE 07/28/2020 1131   PROTEINUR NEGATIVE 07/28/2020 1131   UROBILINOGEN 0.2 04/26/2020 0909   NITRITE NEGATIVE 07/28/2020 1131   LEUKOCYTESUR LARGE (A) 07/28/2020 1131   Sepsis Labs Invalid input(s): PROCALCITONIN,  WBC,  LACTICIDVEN Microbiology No results found for this or any previous visit (from the past 240 hour(s)).   Time coordinating discharge: Over 30 minutes  SIGNED:   Darliss Cheney, MD  [facility] 11/06/2020, 11:26 AM  If 7PM-7AM, please contact night-coverage [email or web address]

## 2020-11-06 NOTE — Procedures (Signed)
Patient seen and examined on Hemodialysis. BP (!) 168/66   Pulse 97   Temp 98.8 F (37.1 C) (Oral)   Resp (!) 23   Ht 5\' 2"  (1.575 m)   Wt 50.2 kg   SpO2 100%   BMI 20.24 kg/m   QB 400 mL/ min via TDC, UF goal 2L  Tolerating treatment without complaints at this time.   Madelon Lips MD [facility] pgr [phone] 7:56 AM

## 2020-11-06 NOTE — Progress Notes (Signed)
Pt back from HD, pt denies sob or discomforts, oxygen 2l [place] intact. Pt reports feeling ready to go home commenting she is steady on her feet and ambulates w/o fatigue sob.

## 2020-11-06 NOTE — Progress Notes (Signed)
Pt transported to hemodialysis

## 2020-11-06 NOTE — Consult Note (Signed)
   Cassandra Dyer [facility] Inpatient Consult   11/06/2020  Cassandra Dyer Jun 09, 1941 [id number]   [facility] [ACO] Patient:  Medicare  LOS:  12 days Patient was assessed for [facility] for community services. Patient was previously active with [facility].  Met with patient at bedside regarding being restarted with [facility] services. She said she was steady on her feet and ready to go home.  Plan:  Follow up with [place] Coordiinator and confirmed with inpatient [facility] RNCM, patient had home health set up and they will check in with her for post hospital needs.  Patient had felt that she had no other needs.     Of note, [facility] Care Management services does not replace or interfere with any services that are arranged by inpatient [facility] care management team.   For additional questions or referrals please contact:  Natividad Brood, RN BSN [facility] Liaison  [phone] business mobile phone Toll free office [phone]  Fax number: [phone] [email or web address]

## 2020-11-06 NOTE — Progress Notes (Signed)
[facility] Progress Note   Subjective:  No complaints this AM.  On dialysis.    Objective Vitals:   11/06/20 0516 11/06/20 0705 11/06/20 0710 11/06/20 0715  BP: (!) 155/63 (!) 163/62 (!) 168/66   Pulse: 97     Resp: 18 11 (!) 25 (!) 23  Temp: 99.1 F (37.3 C) 98.8 F (37.1 C)    TempSrc: Oral Oral    SpO2: 100% 100%    Weight: 50.3 kg 50.2 kg    Height:       Physical Exam General:frail appearing female in NAD Heart:RRR, no murmurs, rubs or gallops Lungs:Decreased breath sound RLL, CTA left lung Abdomen:Soft, non-tender, non-distended, +BS Extremities: no edema b/l lower extremities Dialysis Access:R IJ TDC, LU AVFmaturing, + bruit  Additional Objective Labs: Basic Metabolic Panel: Recent Labs  Lab 11/03/20 0748 11/03/20 0748 11/04/20 0924 11/05/20 0837 11/06/20 0333  NA 129*   < > 134* 133* 130*  K 3.5   < > 3.8 4.0 3.5  CL 93*   < > 96* 95* 94*  CO2 23  --  25  --  24  GLUCOSE 90   < > 106* 107* 129*  BUN 35*   < > 22 14 32*  CREATININE 6.61*   < > 4.33* 3.20* 4.94*  CALCIUM 9.0  --  9.8  --  9.4  PHOS 4.8*  --   --   --   --    < > = values in this interval not displayed.   Liver Function Tests: Recent Labs  Lab 11/02/20 1139 11/03/20 0748 11/06/20 0333  AST 27  --  26  ALT 17  --  17  ALKPHOS 77  --  107  BILITOT 0.4  --  0.7  PROT 5.8*  --  5.5*  ALBUMIN 1.9* 1.7* 1.9*   CBC: Recent Labs  Lab 11/01/20 0123 11/01/20 0123 11/02/20 1139 11/02/20 1139 11/03/20 0748 11/03/20 0748 11/04/20 0924 11/05/20 0837 11/06/20 0333  WBC 14.5*   < > 15.3*   < > 15.4*  --  16.3*  --  25.2*  HGB 9.2*   < > 10.0*   < > 9.2*   < > 10.4* 11.2* 9.7*  HCT 27.8*   < > 31.0*   < > 28.5*   < > 32.7* 33.0* 31.6*  MCV 96.2  --  95.7  --  96.3  --  94.2  --  93.2  PLT 248   < > 226   < > 274  --  281  --  243   < > = values in this interval not displayed.   Blood Culture    Component Value Date/Time   SDES FLUID 10/24/2020 1121    SPECREQUEST NONE 10/24/2020 1121   CULT  10/24/2020 1121    NO GROWTH 5 DAYS Performed at [facility], [place] [address], [place], [place] [address]    REPTSTATUS 10/29/2020 FINAL 10/24/2020 1121   Medications:  . (feeding supplement) PROSource Plus  30 mL Oral BID BM  . Chlorhexidine Gluconate Cloth  6 each Topical Q0600  . Chlorhexidine Gluconate Cloth  6 each Topical Q0600  . Chlorhexidine Gluconate Cloth  6 each Topical Q0600  . heparin  5,000 Units Subcutaneous Q8H  . multivitamin  1 tablet Oral QHS  . rosuvastatin  10 mg Oral Daily    Dialysis Orders: [facility] MWF  4h 62kg 2/2.5 bath Hep none TDC L AVF maturing (use>11/25) mircera100 q  2 weeks, (last 10/27)  venofer 100 q tx right now, 11/3 hgb 8.3 11/1 k 4  Assessment/Plan: 1. Hypoxia-reportedlyhas a baseline O2 req2L,is off O2 this AM. SOB/ resp failure appear to be all due to vol overload/ pulm edema and pleural effusions. Weights have come down about 12kgs since admission with HD and thoracentesis x2.Normal EF on echo.CTA showedleft hilar mass and concerning for malignancy, which explains recent weight loss. Will continue to titrate down volume with HD as tolerated. 2. ESRD:normally MWF at [place] via [facility]- Has only been on HD since August.Had HD 11/21 and 11/22.  HD today 11/24.   3. HTN: mult meds at home , w/ vol lowering BP's downwith volume removaland meds stopped.  4. Anemia of ESRD:on mircera and venofer at OP center- Hgb 11.2Has been onAranesp 118mcgq Friday- stopped  due to concern for malignancy. 5. Metabolic Bone Disease:Phos at goal. Corrected Ca elevated. use low Ca bath. not on vitamin D or sensipar at OP unit. No binder on OP med list 6. Nutrition: Renal diet w/fluid restrictions.Alb low 1.6 -onprotein supplements 7. Hyponatremia: Suspect more of volume overload than SIADH as she is ESRD--> but really the rx is the same, titrating down volume as able.  Madelon Lips MD 11/06/2020,  7:53 AM  [facility] Pager: [phone]

## 2020-11-06 NOTE — Progress Notes (Signed)
Occupational Therapy Treatment Patient Details Name: Cassandra Dyer MRN: [id number] DOB: 05-26-1941 Today's Date: 11/06/2020    History of present illness Pt is a 79 y/o female admitted secondary to worsening SOB. Found to have pulmonary edema. PMH includes HTN, R kidney carcinoma s/p nephrectomy, COPD on 2L, and ESRD on HD.    OT comments  Patient eager for dc home today.  She is completing ADLs and limited in room mobility with modified independence.  Discussed energy conservation techniques and activity progression.  Continue to recommend 3:1 for shower chair at dc.  Based on performance today, no further OT needs identified acutely and OT will sign off.    Follow Up Recommendations  No OT follow up;Supervision - Intermittent    Equipment Recommendations  3 in 1 bedside commode    Recommendations for Other Services      Precautions / Restrictions Precautions Precautions: Fall Precaution Comments: watch O2 sats Restrictions Weight Bearing Restrictions: No       Mobility Bed Mobility Overal bed mobility: Modified Independent                Transfers Overall transfer level: Modified independent               General transfer comment: for sit to stand, good O2 line mgmt and safety     Balance Overall balance assessment: Needs assistance Sitting-balance support: Feet supported Sitting balance-Leahy Scale: Good     Standing balance support: During functional activity;No upper extremity supported Standing balance-Leahy Scale: Fair Standing balance comment: no assist required, no losses of balance noted                           ADL either performed or assessed with clinical judgement   ADL                       Lower Body Dressing: Modified independent;Sit to/from stand   Toilet Transfer: Modified Independent;Ambulation Toilet Transfer Details (indicate cue type and reason): simulated to recliner          Functional mobility  during ADLs: Modified independent General ADL Comments: pt demonstrating ability to complete basic ADLs at modified independent level      Vision       Perception     Praxis      Cognition Arousal/Alertness: Awake/alert Behavior During Therapy: WFL for tasks assessed/performed Overall Cognitive Status: Within Functional Limits for tasks assessed                                          Exercises     Shoulder Instructions       General Comments VSS on 2L [place]     Pertinent Vitals/ Pain       Pain Assessment: No/denies pain  Home Living                                          Prior Functioning/Environment              Frequency  Min 2X/week        Progress Toward Goals  OT Goals(current goals can now be found in the care plan section)  Progress towards OT goals: Goals met/education completed, patient  discharged from OT  Acute Rehab OT Goals Patient Stated Goal: home OT Goal Formulation: With patient  Plan All goals met and education completed, patient discharged from OT services    Co-evaluation                 AM-PAC OT "6 Clicks" Daily Activity     Outcome Measure   Help from another person eating meals?: None Help from another person taking care of personal grooming?: None Help from another person toileting, which includes using toliet, bedpan, or urinal?: None Help from another person bathing (including washing, rinsing, drying)?: None Help from another person to put on and taking off regular upper body clothing?: None Help from another person to put on and taking off regular lower body clothing?: None 6 Click Score: 24    End of Session Equipment Utilized During Treatment: Oxygen (2L)  OT Visit Diagnosis: Other abnormalities of gait and mobility (R26.89);Other (comment);Muscle weakness (generalized) (M62.81)   Activity Tolerance Patient tolerated treatment well   Patient Left in chair;with call  bell/phone within reach   Nurse Communication Mobility status        Time: 6045-4098 OT Time Calculation (min): 9 min  Charges: OT General Charges $OT Visit: 1 Visit OT Treatments $Self Care/Home Management : 8-22 mins  Jolaine Artist, OT [place] Pager [phone] Office [phone]    Delight Stare 11/06/2020, 2:17 PM

## 2020-11-08 ENCOUNTER — Telehealth: Payer: Self-pay | Admitting: *Deleted

## 2020-11-08 ENCOUNTER — Telehealth (HOSPITAL_COMMUNITY): Payer: Self-pay | Admitting: Nephrology

## 2020-11-08 ENCOUNTER — Telehealth: Payer: Self-pay | Admitting: Emergency Medicine

## 2020-11-08 DIAGNOSIS — C3492 Malignant neoplasm of unspecified part of left bronchus or lung: Secondary | ICD-10-CM

## 2020-11-08 DIAGNOSIS — N186 End stage renal disease: Secondary | ICD-10-CM | POA: Diagnosis not present

## 2020-11-08 DIAGNOSIS — Z992 Dependence on renal dialysis: Secondary | ICD-10-CM | POA: Diagnosis not present

## 2020-11-08 DIAGNOSIS — N2581 Secondary hyperparathyroidism of renal origin: Secondary | ICD-10-CM | POA: Diagnosis not present

## 2020-11-08 LAB — SURGICAL PATHOLOGY

## 2020-11-08 LAB — CYTOLOGY - NON PAP

## 2020-11-08 NOTE — Telephone Encounter (Signed)
I received referral today for Cassandra Dyer to be seen with med onc.  I called her but was unable to reach. I did leave vm message with my name and phone number to call.

## 2020-11-08 NOTE — Telephone Encounter (Signed)
Transition of care contact from inpatient facility  Date of Discharge: 11/06/20 Date of Contact: 11/08/20 - attempted Method of contact: Phone  Attempted to contact patient to discuss transition of care from inpatient admission. Patient did not answer the phone. Message was left on the patient's voicemail with call back number [phone].  Veneta Penton, PA-C [employer] Pager [phone]

## 2020-11-08 NOTE — Telephone Encounter (Signed)
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   Will refer her to [facility] at [facility].

## 2020-11-10 ENCOUNTER — Encounter (HOSPITAL_COMMUNITY): Payer: Self-pay | Admitting: Emergency Medicine

## 2020-11-11 ENCOUNTER — Encounter: Payer: Self-pay | Admitting: *Deleted

## 2020-11-11 ENCOUNTER — Other Ambulatory Visit: Payer: Self-pay | Admitting: *Deleted

## 2020-11-11 ENCOUNTER — Telehealth: Payer: Self-pay | Admitting: Internal Medicine

## 2020-11-11 ENCOUNTER — Telehealth: Payer: Self-pay | Admitting: *Deleted

## 2020-11-11 DIAGNOSIS — N179 Acute kidney failure, unspecified: Secondary | ICD-10-CM | POA: Diagnosis not present

## 2020-11-11 DIAGNOSIS — Z992 Dependence on renal dialysis: Secondary | ICD-10-CM | POA: Diagnosis not present

## 2020-11-11 DIAGNOSIS — N186 End stage renal disease: Secondary | ICD-10-CM | POA: Diagnosis not present

## 2020-11-11 DIAGNOSIS — N2581 Secondary hyperparathyroidism of renal origin: Secondary | ICD-10-CM | POA: Diagnosis not present

## 2020-11-11 DIAGNOSIS — R918 Other nonspecific abnormal finding of lung field: Secondary | ICD-10-CM

## 2020-11-11 NOTE — Telephone Encounter (Signed)
   Hoyle Sauer from [facility] calling to make [place] aware that the hospital had put orders in for [facility] and the patient is refusing therapy and she was making [place] aware.  Hoyle [phone]

## 2020-11-11 NOTE — Telephone Encounter (Signed)
I received referral on Cassandra Dyer.  I called her and spoke with her today.  I updated her on her appt to be seen at [facility].  She verbalized understanding of appt time and place.

## 2020-11-11 NOTE — Patient Outreach (Signed)
[facility]) Care Management [facility] CM Telephone Outreach, new referral PCP office completes Transition of Care outreach post-hospital discharge Post-hospital discharge day # 5 Unsuccessful initial outreach attempt- new patient  11/11/2020  Cassandra Dyer [birth year] [id number]  Unsuccessful initial outreach attempt to [name], 79 y/o female referred to [facility] CM 09/12/20 by [facility] Liaison after patient experienced 2 recent hospitalizations September 27-30, 2021 for N/V/D with malaise and weakness; November 9-24, 2021 for volume overload after patient missed established hemodialysis session.  During most recent hospitalization, (L) hilar mass with pulmonary nodules concerning for malignancy discovered during this hospital visit and patient was diagnosed with lung cancer.  She was discharged to home/ self-care with home health services recommended, which patient has refused.  Patient has history including, but not limited to, renal cell cancer with (R) nephrectomy 2018; ESRD- on hemodialysis since 07/2020; COPD- on home O2 at baseline at 2 L/min; HTN/ HLD.  HIPAA compliant voice mail message left for patient, requesting return call back.  Plan:  Will place [facility] CM unsuccessful patient outreach letter in mail requesting call back in writing  Will re-attempt [facility] CM telephone outreach within 4 business days if I do not hear back from patient/ caregiver first  [name], RN, BSN, [employer] Coordinator [facility] Care Management  [phone]

## 2020-11-12 ENCOUNTER — Ambulatory Visit: Payer: Self-pay | Admitting: *Deleted

## 2020-11-12 NOTE — Telephone Encounter (Signed)
Sent to Dr. John. 

## 2020-11-13 DIAGNOSIS — N186 End stage renal disease: Secondary | ICD-10-CM | POA: Diagnosis not present

## 2020-11-13 DIAGNOSIS — N2581 Secondary hyperparathyroidism of renal origin: Secondary | ICD-10-CM | POA: Diagnosis not present

## 2020-11-13 DIAGNOSIS — D631 Anemia in chronic kidney disease: Secondary | ICD-10-CM | POA: Diagnosis not present

## 2020-11-13 DIAGNOSIS — Z992 Dependence on renal dialysis: Secondary | ICD-10-CM | POA: Diagnosis not present

## 2020-11-13 DIAGNOSIS — N179 Acute kidney failure, unspecified: Secondary | ICD-10-CM | POA: Diagnosis not present

## 2020-11-14 ENCOUNTER — Other Ambulatory Visit: Payer: Self-pay

## 2020-11-14 ENCOUNTER — Telehealth: Payer: Self-pay

## 2020-11-14 ENCOUNTER — Inpatient Hospital Stay: Payer: Medicare Other

## 2020-11-14 ENCOUNTER — Other Ambulatory Visit: Payer: Self-pay | Admitting: *Deleted

## 2020-11-14 ENCOUNTER — Inpatient Hospital Stay: Payer: Medicare Other | Attending: Internal Medicine | Admitting: Internal Medicine

## 2020-11-14 ENCOUNTER — Ambulatory Visit
Admission: RE | Admit: 2020-11-14 | Discharge: 2020-11-14 | Disposition: A | Discharge status: Home / Self Care | Payer: Medicare Other | Source: Ambulatory Visit | Attending: Radiation Oncology | Admitting: Radiation Oncology

## 2020-11-14 VITALS — BP 138/52 | HR 91 | Temp 97.8°F | Resp 18 | Ht 62.0 in | Wt 110.6 lb

## 2020-11-14 DIAGNOSIS — Z905 Acquired absence of kidney: Secondary | ICD-10-CM | POA: Insufficient documentation

## 2020-11-14 DIAGNOSIS — R630 Anorexia: Secondary | ICD-10-CM | POA: Diagnosis not present

## 2020-11-14 DIAGNOSIS — C7931 Secondary malignant neoplasm of brain: Secondary | ICD-10-CM | POA: Insufficient documentation

## 2020-11-14 DIAGNOSIS — Z800 Family history of malignant neoplasm of digestive organs: Secondary | ICD-10-CM

## 2020-11-14 DIAGNOSIS — M47819 Spondylosis without myelopathy or radiculopathy, site unspecified: Secondary | ICD-10-CM | POA: Insufficient documentation

## 2020-11-14 DIAGNOSIS — N186 End stage renal disease: Secondary | ICD-10-CM | POA: Diagnosis not present

## 2020-11-14 DIAGNOSIS — R5383 Other fatigue: Secondary | ICD-10-CM | POA: Diagnosis not present

## 2020-11-14 DIAGNOSIS — C3490 Malignant neoplasm of unspecified part of unspecified bronchus or lung: Secondary | ICD-10-CM

## 2020-11-14 DIAGNOSIS — Z8249 Family history of ischemic heart disease and other diseases of the circulatory system: Secondary | ICD-10-CM

## 2020-11-14 DIAGNOSIS — Z7901 Long term (current) use of anticoagulants: Secondary | ICD-10-CM | POA: Diagnosis not present

## 2020-11-14 DIAGNOSIS — Z5111 Encounter for antineoplastic chemotherapy: Secondary | ICD-10-CM

## 2020-11-14 DIAGNOSIS — I1311 Hypertensive heart and chronic kidney disease without heart failure, with stage 5 chronic kidney disease, or end stage renal disease: Secondary | ICD-10-CM | POA: Diagnosis not present

## 2020-11-14 DIAGNOSIS — R0600 Dyspnea, unspecified: Secondary | ICD-10-CM | POA: Diagnosis not present

## 2020-11-14 DIAGNOSIS — R5381 Other malaise: Secondary | ICD-10-CM | POA: Diagnosis not present

## 2020-11-14 DIAGNOSIS — Z87891 Personal history of nicotine dependence: Secondary | ICD-10-CM | POA: Diagnosis not present

## 2020-11-14 DIAGNOSIS — Z8619 Personal history of other infectious and parasitic diseases: Secondary | ICD-10-CM | POA: Diagnosis not present

## 2020-11-14 DIAGNOSIS — J90 Pleural effusion, not elsewhere classified: Secondary | ICD-10-CM | POA: Diagnosis not present

## 2020-11-14 DIAGNOSIS — Z992 Dependence on renal dialysis: Secondary | ICD-10-CM | POA: Insufficient documentation

## 2020-11-14 DIAGNOSIS — C3432 Malignant neoplasm of lower lobe, left bronchus or lung: Secondary | ICD-10-CM | POA: Insufficient documentation

## 2020-11-14 DIAGNOSIS — R918 Other nonspecific abnormal finding of lung field: Secondary | ICD-10-CM

## 2020-11-14 DIAGNOSIS — Z79899 Other long term (current) drug therapy: Secondary | ICD-10-CM | POA: Diagnosis not present

## 2020-11-14 DIAGNOSIS — M6281 Muscle weakness (generalized): Secondary | ICD-10-CM

## 2020-11-14 DIAGNOSIS — C641 Malignant neoplasm of right kidney, except renal pelvis: Secondary | ICD-10-CM | POA: Diagnosis not present

## 2020-11-14 DIAGNOSIS — Z7189 Other specified counseling: Secondary | ICD-10-CM

## 2020-11-14 DIAGNOSIS — Z803 Family history of malignant neoplasm of breast: Secondary | ICD-10-CM

## 2020-11-14 DIAGNOSIS — C3492 Malignant neoplasm of unspecified part of left bronchus or lung: Secondary | ICD-10-CM

## 2020-11-14 DIAGNOSIS — I700 Atherosclerosis of aorta: Secondary | ICD-10-CM | POA: Insufficient documentation

## 2020-11-14 DIAGNOSIS — Z923 Personal history of irradiation: Secondary | ICD-10-CM | POA: Insufficient documentation

## 2020-11-14 DIAGNOSIS — D631 Anemia in chronic kidney disease: Secondary | ICD-10-CM | POA: Insufficient documentation

## 2020-11-14 DIAGNOSIS — R110 Nausea: Secondary | ICD-10-CM

## 2020-11-14 DIAGNOSIS — R531 Weakness: Secondary | ICD-10-CM | POA: Insufficient documentation

## 2020-11-14 DIAGNOSIS — C782 Secondary malignant neoplasm of pleura: Secondary | ICD-10-CM | POA: Diagnosis not present

## 2020-11-14 DIAGNOSIS — I10 Essential (primary) hypertension: Secondary | ICD-10-CM

## 2020-11-14 DIAGNOSIS — D259 Leiomyoma of uterus, unspecified: Secondary | ICD-10-CM | POA: Diagnosis not present

## 2020-11-14 DIAGNOSIS — Z85528 Personal history of other malignant neoplasm of kidney: Secondary | ICD-10-CM | POA: Diagnosis not present

## 2020-11-14 DIAGNOSIS — M1611 Unilateral primary osteoarthritis, right hip: Secondary | ICD-10-CM | POA: Insufficient documentation

## 2020-11-14 DIAGNOSIS — C3491 Malignant neoplasm of unspecified part of right bronchus or lung: Secondary | ICD-10-CM | POA: Diagnosis not present

## 2020-11-14 LAB — CBC WITH DIFFERENTIAL (CANCER CENTER ONLY)
Abs Immature Granulocytes: 0.11 10*3/uL — ABNORMAL HIGH (ref 0.00–0.07)
Basophils Absolute: 0.1 10*3/uL (ref 0.0–0.1)
Basophils Relative: 0 %
Eosinophils Absolute: 0.1 10*3/uL (ref 0.0–0.5)
Eosinophils Relative: 1 %
HCT: 32.7 % — ABNORMAL LOW (ref 36.0–46.0)
Hemoglobin: 10.1 g/dL — ABNORMAL LOW (ref 12.0–15.0)
Immature Granulocytes: 1 %
Lymphocytes Relative: 5 %
Lymphs Abs: 1 10*3/uL (ref 0.7–4.0)
MCH: 27.1 pg (ref 26.0–34.0)
MCHC: 30.9 g/dL (ref 30.0–36.0)
MCV: 87.7 fL (ref 80.0–100.0)
Monocytes Absolute: 1.5 10*3/uL — ABNORMAL HIGH (ref 0.1–1.0)
Monocytes Relative: 7 %
Neutro Abs: 16.9 10*3/uL — ABNORMAL HIGH (ref 1.7–7.7)
Neutrophils Relative %: 86 %
Platelet Count: 335 10*3/uL (ref 150–400)
RBC: 3.73 MIL/uL — ABNORMAL LOW (ref 3.87–5.11)
RDW: 15.5 % (ref 11.5–15.5)
WBC Count: 19.7 10*3/uL — ABNORMAL HIGH (ref 4.0–10.5)
nRBC: 0 % (ref 0.0–0.2)

## 2020-11-14 LAB — CMP (CANCER CENTER ONLY)
ALT: 23 U/L (ref 0–44)
AST: 34 U/L (ref 15–41)
Albumin: 2.2 g/dL — ABNORMAL LOW (ref 3.5–5.0)
Alkaline Phosphatase: 131 U/L — ABNORMAL HIGH (ref 38–126)
Anion gap: 15 (ref 5–15)
BUN: 17 mg/dL (ref 8–23)
CO2: 28 mmol/L (ref 22–32)
Calcium: 9.7 mg/dL (ref 8.9–10.3)
Chloride: 92 mmol/L — ABNORMAL LOW (ref 98–111)
Creatinine: 4.44 mg/dL (ref 0.44–1.00)
GFR, Estimated: 10 mL/min — ABNORMAL LOW (ref >60)
Glucose, Bld: 92 mg/dL (ref 70–99)
Potassium: 4.1 mmol/L (ref 3.5–5.1)
Sodium: 135 mmol/L (ref 135–145)
Total Bilirubin: 0.6 mg/dL (ref 0.3–1.2)
Total Protein: 6.7 g/dL (ref 6.5–8.1)

## 2020-11-14 NOTE — Progress Notes (Signed)
Radiation Oncology         [phone] ________________________________  Name: Cassandra Dyer        MRN: [id number]  Date of Service: 11/14/2020 DOB: 10-Mar-1941  ID:POEU, Hunt Oris, MD  Collene Gobble, MD     REFERRING PHYSICIAN: Collene Gobble, MD   DIAGNOSIS: The primary encounter diagnosis was Renal cell carcinoma of right kidney [facility]). A diagnosis of Mass of upper lobe of left lung was also pertinent to this visit.   HISTORY OF PRESENT ILLNESS: Cassandra Dyer is a 79 y.o. female seen at the request of Dr. Julien Nordmann for a newly diagnosed lung cancer. The patient has a history of right renal cell carcinoma s/p right nephrectomy in 2018. She has ESRD and has been on HD for several months. She presented with shortness of breath on 10/21/20 and was found to have bilateral pleural effusions and pulmonary edema felt to be acute on chronic respiratory failure. She had three separate thoracenteses on 10/24/20, 10/25/20, and 10/31/20 for therapeutic purpose. No cytology was sent. She was found to have CTPA on 11/02/20 that showed a left hilar mass measuring 5.2 cm in greatest dimension, subpleural nodules in the left pulmonary apex, and numerous left hilar and mediastinal nodes. Small to moderate left greater than right pleural effusions were noted. Bronchoscopy on 11/05/20 revealed atypical cells in the LUL brushing, NSCLC in her LUL needle aspirates, and LLL brushings showed atypical cells. An endobronchial biopsy was consistent with adenocarcinoma. She was discharged on 11/06/20 and is seen today to discuss treatment recommendations for her cancer.    PREVIOUS RADIATION THERAPY: No   PAST MEDICAL HISTORY:  Past Medical History:  Diagnosis Date  . Anemia   . ESRD on hemodialysis ([place])   . Hepatitis    hx of hep C - 10-20 years ago   . History of blood transfusion   . History of kidney cancer   . Hyperlipidemia   . Hypertension    not taken b/p med in 2-3 years md deceased never went to  another md        PAST SURGICAL HISTORY: Past Surgical History:  Procedure Laterality Date  . AV FISTULA PLACEMENT Left 08/07/2020   Procedure: LEFT ARM ARTERIOVENOUS (AV) FISTULA CREATION;  Surgeon: [name], MD;  Location: [place];  Service: Vascular;  Laterality: Left;  . BRONCHIAL BIOPSY  11/05/2020   Procedure: BRONCHIAL BIOPSIES;  Surgeon: Collene Gobble, MD;  Location: [facility] ENDOSCOPY;  Service: Cardiopulmonary;;  . BRONCHIAL BRUSHINGS  11/05/2020   Procedure: BRONCHIAL BRUSHINGS;  Surgeon: Collene Gobble, MD;  Location: [place];  Service: Cardiopulmonary;;  . BRONCHIAL NEEDLE ASPIRATION BIOPSY  11/05/2020   Procedure: BRONCHIAL NEEDLE ASPIRATION BIOPSIES;  Surgeon: Collene Gobble, MD;  Location: [place];  Service: Cardiopulmonary;;  . CATARACT EXTRACTION Bilateral    11/30 left /12/9 right  . ELECTROMAGNETIC NAVIGATION BROCHOSCOPY N/A 11/05/2020   Procedure: ELECTROMAGNETIC NAVIGATION BRONCHOSCOPY;  Surgeon: Collene Gobble, MD;  Location: [place];  Service: Cardiopulmonary;  Laterality: N/A;  . ENDOBRONCHIAL ULTRASOUND N/A 11/05/2020   Procedure: ENDOBRONCHIAL ULTRASOUND;  Surgeon: Collene Gobble, MD;  Location: [facility] ENDOSCOPY;  Service: Cardiopulmonary;  Laterality: N/A;  . FINE NEEDLE ASPIRATION  11/05/2020   Procedure: FINE NEEDLE ASPIRATION (FNA) LINEAR;  Surgeon: Collene Gobble, MD;  Location: [place] ENDOSCOPY;  Service: Cardiopulmonary;;  . IR FLUORO GUIDE CV LINE RIGHT  07/27/2020  . IR THORACENTESIS ASP PLEURAL SPACE W/IMG GUIDE  10/24/2020  .  IR THORACENTESIS ASP PLEURAL SPACE W/IMG GUIDE  10/25/2020  . IR THORACENTESIS ASP PLEURAL SPACE W/IMG GUIDE  10/31/2020  . IR US GUIDE VASC ACCESS RIGHT  07/27/2020  . ROBOT ASSISTED LAPAROSCOPIC NEPHRECTOMY Right 01/13/2017   Procedure: XI ROBOTIC ASSISTED LAPAROSCOPIC RADICAL NEPHRECTOMY WITH LYSIS OF ADHESION;  Surgeon: Alexis Frock, MD;  Location: [facility];  Service: Urology;  Laterality: Right;  Marland Kitchen VIDEO  BRONCHOSCOPY N/A 11/05/2020   Procedure: VIDEO BRONCHOSCOPY WITH FLUORO;  Surgeon: Collene Gobble, MD;  Location: [facility] ENDOSCOPY;  Service: Cardiopulmonary;  Laterality: N/A;     FAMILY HISTORY:  Family History  Problem Relation Age of Onset  . Hypertension Mother   . Pancreatic cancer Father   . Breast cancer Sister      SOCIAL HISTORY:  reports that she quit smoking about 19 years ago. Her smoking use included cigarettes. She has a 15.00 pack-year smoking history. She has never used smokeless tobacco. She reports current alcohol use. She reports that she does not use drugs. The patient is single and lives with a female friend. Her sister accompanies her today. She does not have any children.   ALLERGIES: Patient has no known allergies.   MEDICATIONS:  Current Outpatient Medications  Medication Sig Dispense Refill  . amLODipine (NORVASC) 10 MG tablet Take 1 tablet (10 mg total) by mouth daily. 90 tablet 3  . ascorbic acid (VITAMIN C) 500 MG tablet Take 500 mg by mouth 2 (two) times daily.     . Black Currant Seed Oil 500 MG CAPS Take 500 mg by mouth every morning.     . Calcium Carbonate-Vit D-Min (CALCIUM 1200 PO) Take 1,200 mg by mouth every morning.     . carvedilol (COREG) 12.5 MG tablet Take 1 tablet (12.5 mg total) by mouth 2 (two) times daily with a meal. 60 tablet 1  . Cholecalciferol (VITAMIN D3) 50 MCG (2000 UT) capsule Take 2,000 Units by mouth every morning.    . Garlic 6010 MG CAPS Take 2,000 mg by mouth daily.     . metoprolol succinate (TOPROL-XL) 100 MG 24 hr tablet Take 100 mg by mouth daily.    . Multiple Vitamin (MULTIVITAMIN WITH MINERALS) TABS tablet Take 1 tablet by mouth every morning.     . Omega-3 Fatty Acids (FISH OIL PO) Take 1 capsule by mouth every morning.     . ondansetron (ZOFRAN) 4 MG tablet Take 1 tablet (4 mg total) by mouth every 8 (eight) hours as needed for nausea or vomiting. 30 tablet 1  . OVER THE COUNTER MEDICATION Take 1 capsule by mouth  daily. Mega juice    . polyethylene glycol (MIRALAX / GLYCOLAX) 17 g packet Take 17 g by mouth daily as needed for mild constipation.    . rosuvastatin (CRESTOR) 10 MG tablet Take 1 tablet (10 mg total) by mouth daily. 90 tablet 3  . senna-docusate (SENOKOT-S) 8.6-50 MG tablet Take 1 tablet by mouth daily as needed for mild constipation. (Patient not taking: Reported on 10/21/2020)    . telmisartan (MICARDIS) 80 MG tablet Take 80 mg by mouth daily.     No current facility-administered medications for this encounter.     REVIEW OF SYSTEMS: On review of systems, the patient reports that she is doing well overall. She has been using 2L O2 via nasal cannula since her discharge from the hospital last week. She has been on O2 short term previously following a separate hospitalization. She denies any chest pain,  fevers,  chills, or night sweats. She has noticed a chronic cough with blood tinged sputum for several months. She reports no frank hemoptysis however. She reports shortness of breath any type of exertion even with O2 but non at rest. She has lost about 20 pounds of unintended weight changes in the past month. No other complaints are verbalized.    PHYSICAL EXAM:  Wt Readings from Last 3 Encounters:  11/14/20 110 lb 9.6 oz (50.2 kg)  11/06/20 106 lb 4.2 oz (48.2 kg)  10/17/20 136 lb (61.7 kg)   Temp Readings from Last 3 Encounters:  11/14/20 97.8 F (36.6 C) (Tympanic)  11/06/20 98.3 F (36.8 C) (Oral)  10/17/20 98.4 F (36.9 C) (Temporal)   BP Readings from Last 3 Encounters:  11/14/20 (!) 138/52  11/06/20 (!) 143/75  10/17/20 (!) 141/65   Pulse Readings from Last 3 Encounters:  11/14/20 91  11/06/20 98  10/17/20 81   In general this is a well appearing African American female in no acute distress. She's alert and oriented x4 and appropriate throughout the examination. Cardiopulmonary assessment is negative for acute distress and she exhibits normal effort.    ECOG = 1  0 -  Asymptomatic (Fully active, able to carry on all predisease activities without restriction)  1 - Symptomatic but completely ambulatory (Restricted in physically strenuous activity but ambulatory and able to carry out work of a light or sedentary nature. For example, light housework, office work)  2 - Symptomatic, <50% in bed during the day (Ambulatory and capable of all self care but unable to carry out any work activities. Up and about more than 50% of waking hours)  3 - Symptomatic, >50% in bed, but not bedbound (Capable of only limited self-care, confined to bed or chair 50% or more of waking hours)  4 - Bedbound (Completely disabled. Cannot carry on any self-care. Totally confined to bed or chair)  5 - Death   Eustace Pen MM, Creech RH, Tormey DC, et al. [id number]). "Toxicity and response criteria of the [place] [facility]". [place] Oncol. 5 (6): 649-55    LABORATORY DATA:  Lab Results  Component Value Date   WBC 19.7 (H) 11/14/2020   HGB 10.1 (L) 11/14/2020   HCT 32.7 (L) 11/14/2020   MCV 87.7 11/14/2020   PLT 335 11/14/2020   Lab Results  Component Value Date   NA 135 11/14/2020   K 4.1 11/14/2020   CL 92 (L) 11/14/2020   CO2 28 11/14/2020   Lab Results  Component Value Date   ALT 23 11/14/2020   AST 34 11/14/2020   ALKPHOS 131 (H) 11/14/2020   BILITOT 0.6 11/14/2020      RADIOGRAPHY: DG Chest 1 View  Result Date: 10/31/2020 CLINICAL DATA:  Status post thoracentesis EXAM: CHEST  1 VIEW COMPARISON:  July 30, 2020. FINDINGS: Left pleural effusion smaller following thoracentesis. Small pleural effusion on each side. No pneumothorax. There is slight bibasilar atelectasis. Heart size and pulmonary vascular normal. No adenopathy. Central catheter tip is at the cavoatrial junction. There is aortic atherosclerosis. No bone lesions. IMPRESSION: Small pleural effusion currently on each side with mild bibasilar atelectasis. No pneumothorax. No consolidation. Stable  cardiac silhouette. Aortic Atherosclerosis (ICD10-I70.0). Electronically Signed   By: Lowella Grip III M.D.   On: 10/31/2020 12:21   DG Chest 1 View  Result Date: 10/25/2020 CLINICAL DATA:  79 year old female with a history of thoracentesis EXAM: CHEST  1 VIEW COMPARISON:  10/24/2020, 10/23/2020 FINDINGS: Cardiomediastinal silhouette  unchanged in size and contour with the heart borders partially obscured by overlying lung and pleural disease. Decreasing interlobular septal thickening. Unchanged right IJ tunneled hemodialysis catheter. Improved opacity at the right lung base with minimal blunting at the right costophrenic angle. No pneumothorax. Opacity at the left lung base is increased from the most recent comparison, though not as severe as the study dated 10/23/2020. No pneumothorax on the left. IMPRESSION: Decreased right-sided pleural effusion with no pneumothorax status post right thoracentesis. Recurrent left-sided pleural effusion and associated atelectasis/consolidation. Unchanged right IJ tunneled hemodialysis catheter. Improving pulmonary edema. Electronically Signed   By: Corrie Mckusick D.O.   On: 10/25/2020 16:40   DG Chest 1 View  Result Date: 10/24/2020 CLINICAL DATA:  Post thoracentesis today EXAM: CHEST  1 VIEW COMPARISON:  10/23/2020 FINDINGS: Significant improvement in bilateral pleural effusions with near complete clearing of left pleural fluid and small residual right pleural effusion. Improved aeration in both lung bases persistent right lower lobe atelectasis. Negative for pneumothorax. Improvement in vascular congestion and edema. Dialysis catheter in the lower SVC unchanged. IMPRESSION: Improvement in bilateral pleural effusions and bilateral edema. No pneumothorax post thoracentesis. Electronically Signed   By: Franchot Gallo M.D.   On: 10/24/2020 11:21   DG Chest 2 View  Result Date: 10/21/2020 CLINICAL DATA:  79 year old female with shortness of breath and increased  weakness. Missed hemodialysis today. EXAM: CHEST - 2 VIEW COMPARISON:  Portable chest 09/10/2020 and earlier. FINDINGS: AP and lateral views of the chest today. Stable right chest tunneled dialysis catheter. Moderate bilateral pleural effusions are increased since September, resembling similar effusions in August. Upper lung vascular congestion. Most mediastinal contours are obscured today. Visualized tracheal air column is within normal limits. No pneumothorax. No acute osseous abnormality identified. Abdominal Calcified aortic atherosclerosis. Paucity of bowel gas in the upper abdomen. IMPRESSION: Moderate bilateral pleural effusions are increased since September, with superimposed mild or developing pulmonary interstitial edema. Electronically Signed   By: Genevie Ann M.D.   On: 10/21/2020 18:31   CT ANGIO CHEST PE W OR WO CONTRAST  Result Date: 11/02/2020 CLINICAL DATA:  Respiratory failure EXAM: CT ANGIOGRAPHY CHEST WITH CONTRAST TECHNIQUE: Multidetector CT imaging of the chest was performed using the standard protocol during bolus administration of intravenous contrast. Multiplanar CT image reconstructions and MIPs were obtained to evaluate the vascular anatomy. CONTRAST:  46mL OMNIPAQUE IOHEXOL 350 MG/ML SOLN COMPARISON:  CT abdomen pelvis, 04/08/2020, 08/05/2017 FINDINGS: Cardiovascular: Satisfactory opacification of the pulmonary arteries. No evidence of pulmonary embolism. The left lower lobar pulmonary arteries are nearly occluded by a left hilar mass. Normal heart size. Left coronary artery calcifications. No pericardial effusion. Aortic atherosclerosis. Mediastinum/Nodes: Numerous enlarged left hilar and mediastinal lymph nodes, with a left hilar mass. Largest discretely measurable lymph nodes measure up to 2.1 x 1.4 cm (series 8, image 56). Thyroid gland, trachea, and esophagus demonstrate no significant findings. Lungs/Pleura: Mild centrilobular emphysema. Small to moderate, left greater than right  pleural effusions and associated atelectasis or consolidation. There is a left hilar mass measuring approximately 5.2 x 4.0 cm, which obstructs the left lower lobar bronchus near the origin (series 9, image 208). There is a subpleural nodule of the posterior left apex measuring 1.4 x 1.3 cm (series 8, image 31) and an additional subpleural nodule of the high medial apex measuring 0.9 x 0.6 cm (series 8, image 25). Upper Abdomen: Partially imaged left adrenal nodule, not obviously changed compared to prior examination dated 04/08/2020 measuring 1.5 x 1.0 cm (  series 9, image 39). Musculoskeletal: No chest wall abnormality. No acute or significant osseous findings. Review of the MIP images confirms the above findings. IMPRESSION: 1. Negative examination for pulmonary embolism. 2. There is a left hilar mass measuring approximately 5.2 x 4.0 cm, which obstructs the left lower lobar bronchus near the origin as well as the left lower lobar pulmonary arteries. 3. Subpleural nodules of the left pulmonary apex. 4. Numerous enlarged left hilar and mediastinal lymph nodes. 5. Constellation of findings is consistent primary lung malignancy and nodal and left-sided pulmonary metastatic disease. 6. Small to moderate, left greater than right pleural effusions and associated atelectasis or consolidation. Left pleural effusion is presumed malignant given presence of subpleural pulmonary nodules, there is no direct evidence of right sided malignant effusion or metastatic disease. 7. Partially imaged left adrenal nodule, not obviously changed compared to prior examinations and previously better characterized as a benign adenoma. Aortic Atherosclerosis (ICD10-I70.0) and Emphysema (ICD10-J43.9). Electronically Signed   By: Eddie Candle M.D.   On: 11/02/2020 19:11   DG Chest Port 1 View  Result Date: 11/05/2020 CLINICAL DATA:  Patient status post left bronchoscopy today. EXAM: PORTABLE CHEST 1 VIEW COMPARISON:  Single-view of the  chest 11/01/2019. FINDINGS: Negative for pneumothorax after bronchoscopy. Small bilateral pleural effusions and basilar atelectasis are worse on the left. Heart size is normal. Aortic atherosclerosis. Dialysis catheter is unchanged. IMPRESSION: Negative for pneumothorax after bronchoscopy. Small bilateral pleural effusions and basilar airspace disease, worse on the left. Electronically Signed   By: Inge Rise M.D.   On: 11/05/2020 12:17   DG CHEST PORT 1 VIEW  Result Date: 10/30/2020 CLINICAL DATA:  Shortness of breath EXAM: PORTABLE CHEST 1 VIEW COMPARISON:  October 28, 2020 FINDINGS: Central catheter tip is at the cavoatrial junction. No pneumothorax. There are pleural effusions bilaterally with bibasilar atelectasis. Heart is mildly enlarged with pulmonary vascularity normal. No adenopathy. There is aortic atherosclerosis. No bone lesions. IMPRESSION: Central catheter as described without pneumothorax. Pleural effusions bilaterally with bibasilar atelectasis. It should be noted that superimposed pneumonia in the bases cannot be excluded by radiography. No new opacity appreciable. Stable cardiac silhouette. Aortic Atherosclerosis (ICD10-I70.0). Electronically Signed   By: Lowella Grip III M.D.   On: 10/30/2020 08:15   DG CHEST PORT 1 VIEW  Result Date: 10/28/2020 CLINICAL DATA:  Dyspnea, end-stage renal disease EXAM: PORTABLE CHEST 1 VIEW COMPARISON:  10/25/2020 FINDINGS: Right internal jugular hemodialysis catheter is seen with its tip at the superior right atrium. Pulmonary insufflation is normal. Large left and small right pleural effusions are present and have enlarged in size since prior examination. Superimposed mild interstitial pulmonary edema has progressed in the interval since prior examination. No pneumothorax. Cardiac size is at the upper limits of normal. No acute bone abnormality. IMPRESSION: Progressive cardiogenic failure or volume overload with progressive interstitial  pulmonary infiltrate and enlarging bilateral pleural effusions, left greater than right. Electronically Signed   By: Fidela Salisbury MD   On: 10/28/2020 06:42   DG CHEST PORT 1 VIEW  Result Date: 10/23/2020 CLINICAL DATA:  Shortness of breath. EXAM: PORTABLE CHEST 1 VIEW COMPARISON:  10/21/2020 FINDINGS: The heart is enlarged but stable. The right IJ dialysis catheter is stable. Interval development of diffuse interstitial pulmonary edema. Persistent bilateral pleural effusions. No pneumothorax. IMPRESSION: Interval development of diffuse interstitial pulmonary edema. Persistent pleural effusions. Electronically Signed   By: Marijo Sanes M.D.   On: 10/23/2020 07:15   VAS US DUPLEX DIALYSIS ACCESS (AVF,AVG)  Result  Date: 10/18/2020 DIALYSIS ACCESS Reason for Exam: Routine follow up. Access Site: Left Upper Extremity. Access Type: Brachial-cephalic AVF (50/93/2671). Performing Technologist: Ivan Croft  Examination Guidelines: A complete evaluation includes B-mode imaging, spectral Doppler, color Doppler, and power Doppler as needed of all accessible portions of each vessel. Unilateral testing is considered an integral part of a complete examination. Limited examinations for reoccurring indications may be performed as noted.  Findings: +--------------------+----------+-----------------+--------+ AVF                 PSV (cm/s)Flow Vol (mL/min)Comments +--------------------+----------+-----------------+--------+ Native artery inflow   205           788                +--------------------+----------+-----------------+--------+ AVF Anastomosis        823                              +--------------------+----------+-----------------+--------+  +------------+----------+-------------+----------+--------+ OUTFLOW VEINPSV (cm/s)Diameter (cm)Depth (cm)Describe +------------+----------+-------------+----------+--------+ Shoulder       224        0.58        0.83             +------------+----------+-------------+----------+--------+ Prox UA        158        0.61        0.48            +------------+----------+-------------+----------+--------+ Mid UA         158        0.73        0.34    branch  +------------+----------+-------------+----------+--------+ Dist UA        116        0.86        0.31            +------------+----------+-------------+----------+--------+  Diagnosing physician: Servando Snare MD Electronically signed by Servando Snare MD on 10/18/2020 at 10:15:31 AM.   --------------------------------------------------------------------------------   Final    ECHOCARDIOGRAM COMPLETE  Result Date: 10/29/2020    ECHOCARDIOGRAM REPORT   Patient Name:   Cassandra Dyer Date of Exam: 10/29/2020 Medical Rec #:  [id number]       Height:       62.0 in Accession #:    [id number]      Weight:       121.3 lb Date of Birth:  05/14/1941       BSA:          1.545 m Patient Age:    56 years        BP:           113/56 mmHg Patient Gender: F               HR:           83 bpm. Exam Location:  Inpatient Procedure: 2D Echo, Cardiac Doppler and Color Doppler Indications:    Fluid Overload  History:        Patient has no prior history of Echocardiogram examinations.                 Risk Factors:Hypertension and Dyslipidemia.  Sonographer:    Bernadene Person RDCS Referring Phys: [id number] [place]  1. Left ventricular ejection fraction, by estimation, is 60 to 65%. The left ventricle has normal function. The left ventricle has no regional wall motion abnormalities. Left ventricular diastolic parameters are consistent with Grade II diastolic dysfunction (pseudonormalization).  2. Right ventricular systolic function is normal. The right ventricular size is mildly enlarged. There is moderately elevated pulmonary artery systolic pressure. The estimated right ventricular systolic pressure is 41.9 mmHg.  3. The mitral valve is grossly normal. No evidence of mitral valve  regurgitation. No evidence of mitral stenosis.  4. The aortic valve is abnormal. There is mild calcification of the aortic valve. Aortic valve regurgitation is not visualized. Mild aortic valve sclerosis is present, with no evidence of aortic valve stenosis.  5. The inferior vena cava is normal in size with greater than 50% respiratory variability, suggesting right atrial pressure of 3 mmHg. FINDINGS  Left Ventricle: Left ventricular ejection fraction, by estimation, is 60 to 65%. The left ventricle has normal function. The left ventricle has no regional wall motion abnormalities. The left ventricular internal cavity size was normal in size. There is  no left ventricular hypertrophy. Left ventricular diastolic parameters are consistent with Grade II diastolic dysfunction (pseudonormalization). Right Ventricle: The right ventricular size is mildly enlarged. No increase in right ventricular wall thickness. Right ventricular systolic function is normal. There is moderately elevated pulmonary artery systolic pressure. The tricuspid regurgitant velocity is 3.50 m/s, and with an assumed right atrial pressure of 3 mmHg, the estimated right ventricular systolic pressure is 37.9 mmHg. Left Atrium: Left atrial size was normal in size. Right Atrium: Right atrial size was normal in size. Pericardium: There is no evidence of pericardial effusion. Mitral Valve: The mitral valve is grossly normal. No evidence of mitral valve regurgitation. No evidence of mitral valve stenosis. Tricuspid Valve: The tricuspid valve is normal in structure. Tricuspid valve regurgitation is mild . No evidence of tricuspid stenosis. Aortic Valve: The aortic valve is abnormal. There is mild calcification of the aortic valve. Aortic valve regurgitation is not visualized. Mild aortic valve sclerosis is present, with no evidence of aortic valve stenosis. Pulmonic Valve: The pulmonic valve was grossly normal. Pulmonic valve regurgitation is mild to moderate.  No evidence of pulmonic stenosis. Aorta: The aortic root is normal in size and structure. Venous: The inferior vena cava is normal in size with greater than 50% respiratory variability, suggesting right atrial pressure of 3 mmHg. IAS/Shunts: No atrial level shunt detected by color flow Doppler. Additional Comments: A venous catheter is visualized in the right atrium. There is a moderate pleural effusion.  LEFT VENTRICLE PLAX 2D LVIDd:         4.17 cm  Diastology LVIDs:         2.21 cm  LV e' medial:    4.90 cm/s LV PW:         0.59 cm  LV E/e' medial:  22.0 LV IVS:        0.64 cm  LV e' lateral:   8.05 cm/s LVOT diam:     2.10 cm  LV E/e' lateral: 13.4 LV SV:         90 LV SV Index:   58 LVOT Area:     3.46 cm  RIGHT VENTRICLE TAPSE (M-mode): 1.9 cm LEFT ATRIUM             Index       RIGHT ATRIUM           Index LA diam:        3.70 cm 2.39 cm/m  RA Area:     14.50 cm LA Vol (A2C):   49.5 ml 32.03 ml/m RA Volume:   36.30 ml  23.49 ml/m LA Vol (A4C):  46.9 ml 30.35 ml/m LA Biplane Vol: 50.1 ml 32.42 ml/m  AORTIC VALVE LVOT Vmax:   145.00 cm/s LVOT Vmean:  93.500 cm/s LVOT VTI:    0.259 m  AORTA Ao Root diam: 2.70 cm Ao Asc diam:  2.10 cm MITRAL VALVE                TRICUSPID VALVE MV Area (PHT): 2.91 cm     TR Peak grad:   49.0 mmHg MV Decel Time: 261 msec     TR Vmax:        350.00 cm/s MV E velocity: 108.00 cm/s MV A velocity: 124.00 cm/s  SHUNTS MV E/A ratio:  0.87         Systemic VTI:  0.26 m                             Systemic Diam: 2.10 cm Cherlynn Kaiser MD Electronically signed by Cherlynn Kaiser MD Signature Date/Time: 10/29/2020/4:59:56 PM    Final    IR THORACENTESIS ASP PLEURAL SPACE W/IMG GUIDE  Result Date: 10/31/2020 INDICATION: Patient with end-stage renal disease, bilateral pleural effusions. Request is made for therapeutic thoracentesis. EXAM: ULTRASOUND GUIDED THERAPEUTIC RIGHT THORACENTESIS MEDICATIONS: 10 mL 1% lidocaine COMPLICATIONS: None immediate. PROCEDURE: An ultrasound  guided thoracentesis was thoroughly discussed with the patient and questions answered. The benefits, risks, alternatives and complications were also discussed. The patient understands and wishes to proceed with the procedure. Written consent was obtained. Ultrasound was performed to localize and mark an adequate pocket of fluid in the right chest. The area was then prepped and draped in the normal sterile fashion. 1% Lidocaine was used for local anesthesia. Under ultrasound guidance a 6 Fr Safe-T-Centesis catheter was introduced. Thoracentesis was performed. The catheter was removed and a dressing applied. FINDINGS: A total of approximately 650 mL of amber fluid was removed. Samples were sent to the laboratory as requested by the clinical team. IMPRESSION: Successful ultrasound guided therapeutic right thoracentesis yielding 650 mL of pleural fluid. Read by: Brynda Greathouse PA-C Electronically Signed   By: Jerilynn Mages.  Shick M.D.   On: 10/31/2020 16:13   IR THORACENTESIS ASP PLEURAL SPACE W/IMG GUIDE  Result Date: 10/25/2020 INDICATION: Shortness of breath. Right-sided pleural effusion. Request for therapeutic thoracentesis. EXAM: ULTRASOUND GUIDED RIGHT THORACENTESIS MEDICATIONS: 1% plain lidocaine, 5 mL COMPLICATIONS: None immediate. PROCEDURE: An ultrasound guided thoracentesis was thoroughly discussed with the patient and questions answered. The benefits, risks, alternatives and complications were also discussed. The patient understands and wishes to proceed with the procedure. Written consent was obtained. Ultrasound was performed to localize and mark an adequate pocket of fluid in the right chest. The area was then prepped and draped in the normal sterile fashion. 1% Lidocaine was used for local anesthesia. Under ultrasound guidance a 6 Fr Safe-T-Centesis catheter was introduced. Thoracentesis was performed. The catheter was removed and a dressing applied. FINDINGS: A total of approximately 700 mL of clear yellow  fluid was removed. IMPRESSION: Successful ultrasound guided right thoracentesis yielding 700 mL of pleural fluid. Read by: Ascencion Dike PA-C Electronically Signed   By: Aletta Edouard M.D.   On: 10/25/2020 16:35   IR THORACENTESIS ASP PLEURAL SPACE W/IMG GUIDE  Result Date: 10/24/2020 INDICATION: Patient with ESRD in fluid overload, acute pulmonary edema, dyspnea, and bilateral pleural effusions, L>R. Request is made for diagnostic and therapeutic left thoracentesis. EXAM: ULTRASOUND GUIDED DIAGNOSTIC AND THERAPEUTIC LEFT THORACENTESIS MEDICATIONS: 10 mL 1% lidocaine COMPLICATIONS:  None immediate. PROCEDURE: An ultrasound guided thoracentesis was thoroughly discussed with the patient and questions answered. The benefits, risks, alternatives and complications were also discussed. The patient understands and wishes to proceed with the procedure. Written consent was obtained. Ultrasound was performed to localize and mark an adequate pocket of fluid in the left chest. The area was then prepped and draped in the normal sterile fashion. 1% Lidocaine was used for local anesthesia. Under ultrasound guidance a 6 Fr Safe-T-Centesis catheter was introduced. Thoracentesis was performed. The catheter was removed and a dressing applied. FINDINGS: A total of approximately 1 L of clear gold fluid was removed. Samples were sent to the laboratory as requested by the clinical team. IMPRESSION: Successful ultrasound guided left thoracentesis yielding 1 L of pleural fluid. Read by: Earley Abide, PA-C Electronically Signed   By: Ruthann Cancer MD   On: 10/24/2020 11:54   DG C-ARM BRONCHOSCOPY  Result Date: 11/05/2020 C-ARM BRONCHOSCOPY: Fluoroscopy was utilized by the requesting physician.  No radiographic interpretation.       IMPRESSION/PLAN: 1. At least Stage IIIB, [id number], NSCLC, adenocarcinoma of the LUL. Dr. Lisbeth Renshaw discusses the pathology findings and reviews the nature of locally advanced lung cancer. She needs  further work up to clearly stage her disease with PET and MRI brain. We will follow up with the results of these tests. Dr. Lisbeth Renshaw anticipates a role for radiotherapy either in the palliative setting or definitive setting with chemoRT. We discussed the risks, benefits, short, and long term effects of radiotherapy, as well as the curative intent, and the patient is interested in proceeding. Dr. Lisbeth Renshaw discusses the delivery and logistics of radiotherapy and anticipates a course of either 3 or 6 1/2 weeks of radiotherapy. She will be contacted and asked to come in for further discussion and simulation in the near future. 2. 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 She will continue with Dr. Candiss Norse and her team at [facility]. We will send her notes as well to Dr. Candiss Norse. We would coordinate treatment for radiation prior to her HD so this would be well planned out to avoid overlap.  In a visit lasting 60 minutes, greater than 50% of the time was spent face to face discussing the patient's condition, in preparation for the discussion, and coordinating the patient's care.   The above documentation reflects my direct findings during this shared patient visit. Please see the separate note by Dr. Lisbeth Renshaw on this date for the remainder of the patient's plan of care.    Carola Rhine, PAC

## 2020-11-14 NOTE — Progress Notes (Signed)
[place] Telephone:([phone]   [phone] Multidisciplinary thoracic oncology clinic  CONSULT NOTE  REFERRING PHYSICIAN: Dr. Baltazar Apo  REASON FOR CONSULTATION:  79 years old African-American female recently diagnosed with lung cancer.  HPI Cassandra Dyer is a 79 y.o. female with past medical history significant for end-stage renal disease and she is currently on hemodialysis on Monday, Wednesday and Friday, history of anemia of chronic disease, history of hepatitis C, renal cell carcinoma status post right nephrectomy in 2018, dyslipidemia, hypertension as well as long history of smoking but quit 15 years ago.  The patient mentioned that she has been complaining of shortness of breath and weakness recently.  She was found on imaging studies with chest x-ray on 07/26/2020 to have small bilateral pleural effusion with bibasilar atelectasis.  Her shortness of breath continues to get worse and repeat chest x-ray on October 23, 2020 showed interval development of diffuse interstitial pulmonary edema as well as persistent pleural effusions.  On October 24, 2020 she underwent ultrasound-guided left thoracentesis with drainage of 1 L of pleural fluid but it was not sent for cytology.  On October 25, 2020 she underwent ultrasound-guided right thoracentesis with drainage of 700 mL of clear yellow fluid.  And October 31, 2020 she underwent a third ultrasound-guided right thoracentesis with drainage of 650 mL of pleural fluid. The patient had CT angiogram of the chest on November 02, 2020 and it showed no evidence for pulmonary embolism but there was a left hilar mass measuring approximately 5.2 x 4.0 cm which obstructs the left lower lobar bronchus near the origin as well as the left lower lobar pulmonary arteries.  There was also subpleural nodules of the left pulmonary apex and numerous enlarged left hilar and mediastinal lymph nodes.  These findings were concerning for  primary bronchogenic carcinoma with nodal and left-sided pulmonary metastatic disease.  There was also small to moderate left greater than right pleural effusions with associated atelectasis and/or consolidation.  On November 05, 2020 the patient underwent video bronchoscopy with electromagnetic navigation and endobronchial ultrasound procedure under the care of Dr. Lamonte Sakai. The final pathology ([id number]) of the left lower lobe endobronchial biopsy was consistent with adenocarcinoma. By immunohistochemistry, the neoplastic cells are positive for TTF-1 but negative for cytokeratin 5/6 and p40, supporting the diagnosis of adenocarcinoma. The patient was referred to the multidisciplinary thoracic oncology clinic today for evaluation and recommendation regarding treatment of her condition. When seen today the patient continues to complain of mild cough and shortness of breath at baseline increased with exertion and she is currently on home oxygen.  She denied having any chest pain or hemoptysis.  She has poor appetite and lost few pounds.  She also has mild nausea.  She denied having any vomiting, diarrhea or constipation.  She denied having any headache or visual changes. Family history significant for mother with hypertension.  Father had pancreatic cancer and sister had breast cancer. The patient is single and has no children.  She used to do domestic work.  She was accompanied today by her sister Cassandra Dyer the patient has a history of smoking 1 pack/day for around 45 years and quit 15 years ago.  She drinks alcohol occasionally and no history of drug abuse.  HPI  Past Medical History:  Diagnosis Date  . Anemia   . ESRD on hemodialysis ([place])   . Hepatitis    hx of hep C - 10-20 years ago   . History of blood  transfusion   . History of kidney cancer   . Hyperlipidemia   . Hypertension    not taken b/p med in 2-3 years md deceased never went to another md     Past Surgical History:  Procedure  Laterality Date  . AV FISTULA PLACEMENT Left 08/07/2020   Procedure: LEFT ARM ARTERIOVENOUS (AV) FISTULA CREATION;  Surgeon: [name], MD;  Location: [facility];  Service: Vascular;  Laterality: Left;  . BRONCHIAL BIOPSY  11/05/2020   Procedure: BRONCHIAL BIOPSIES;  Surgeon: Collene Gobble, MD;  Location[facility] ENDOSCOPY;  Service: Cardiopulmonary;;  . BRONCHIAL BRUSHINGS  11/05/2020   Procedure: BRONCHIAL BRUSHINGS;  Surgeon: Collene Gobble, MD;  Location: [facility];  Service: Cardiopulmonary;;  . BRONCHIAL NEEDLE ASPIRATION BIOPSY  11/05/2020   Procedure: BRONCHIAL NEEDLE ASPIRATION BIOPSIES;  Surgeon: Collene Gobble, MD;  Location: [facility];  Service: Cardiopulmonary;;  . CATARACT EXTRACTION Bilateral    11/30 left /12/9 right  . ELECTROMAGNETIC NAVIGATION BROCHOSCOPY N/A 11/05/2020   Procedure: ELECTROMAGNETIC NAVIGATION BRONCHOSCOPY;  Surgeon: Collene Gobble, MD;  Location: [facility];  Service: Cardiopulmonary;  Laterality: N/A;  . ENDOBRONCHIAL ULTRASOUND N/A 11/05/2020   Procedure: ENDOBRONCHIAL ULTRASOUND;  Surgeon: Collene Gobble, MD;  Location: [facility] ENDOSCOPY;  Service: Cardiopulmonary;  Laterality: N/A;  . FINE NEEDLE ASPIRATION  11/05/2020   Procedure: FINE NEEDLE ASPIRATION (FNA) LINEAR;  Surgeon: Collene Gobble, MD;  Location: [facility] ENDOSCOPY;  Service: Cardiopulmonary;;  . IR FLUORO GUIDE CV LINE RIGHT  07/27/2020  . IR THORACENTESIS ASP PLEURAL SPACE W/IMG GUIDE  10/24/2020  . IR THORACENTESIS ASP PLEURAL SPACE W/IMG GUIDE  10/25/2020  . IR THORACENTESIS ASP PLEURAL SPACE W/IMG GUIDE  10/31/2020  . IR US GUIDE VASC ACCESS RIGHT  07/27/2020  . ROBOT ASSISTED LAPAROSCOPIC NEPHRECTOMY Right 01/13/2017   Procedure: XI ROBOTIC ASSISTED LAPAROSCOPIC RADICAL NEPHRECTOMY WITH LYSIS OF ADHESION;  Surgeon: Alexis Frock, MD;  Location: [facility];  Service: Urology;  Laterality: Right;  Marland Kitchen VIDEO BRONCHOSCOPY N/A 11/05/2020   Procedure: VIDEO BRONCHOSCOPY WITH FLUORO;  Surgeon: Collene Gobble, MD;  Location: [facility] ENDOSCOPY;  Service: Cardiopulmonary;  Laterality: N/A;    Family History  Problem Relation Age of Onset  . Hypertension Mother   . Pancreatic cancer Father   . Breast cancer Sister     Social History Social History   Tobacco Use  . Smoking status: Former Smoker    Packs/day: 0.50    Years: 30.00    Pack years: 15.00    Types: Cigarettes    Quit date: 12/14/2000    Years since quitting: 19.9  . Smokeless tobacco: Never Used  Vaping Use  . Vaping Use: Never used  Substance Use Topics  . Alcohol use: Yes    Comment: occasional wine   . Drug use: No    No Known Allergies  Current Outpatient Medications  Medication Sig Dispense Refill  . amLODipine (NORVASC) 10 MG tablet Take 1 tablet (10 mg total) by mouth Dyer. 90 tablet 3  . ascorbic acid (VITAMIN C) 500 MG tablet Take 500 mg by mouth 2 (two) times Dyer.     . Black Currant Seed Oil 500 MG CAPS Take 500 mg by mouth every morning.     . Calcium Carbonate-Vit D-Min (CALCIUM 1200 PO) Take 1,200 mg by mouth every morning.     . carvedilol (COREG) 12.5 MG tablet Take 1 tablet (12.5 mg total) by mouth 2 (two) times Dyer with a meal. 60 tablet 1  .  Cholecalciferol (VITAMIN D3) 50 MCG (2000 UT) capsule Take 2,000 Units by mouth every morning.    . Garlic 1884 MG CAPS Take 2,000 mg by mouth Dyer.     . metoprolol succinate (TOPROL-XL) 100 MG 24 hr tablet Take 100 mg by mouth Dyer.    . Multiple Vitamin (MULTIVITAMIN WITH MINERALS) TABS tablet Take 1 tablet by mouth every morning.     . Omega-3 Fatty Acids (FISH OIL PO) Take 1 capsule by mouth every morning.     . ondansetron (ZOFRAN) 4 MG tablet Take 1 tablet (4 mg total) by mouth every 8 (eight) hours as needed for nausea or vomiting. 30 tablet 1  . OVER THE COUNTER MEDICATION Take 1 capsule by mouth Dyer. Mega juice    . polyethylene glycol (MIRALAX / GLYCOLAX) 17 g packet Take 17 g by mouth Dyer as needed for mild constipation.    .  rosuvastatin (CRESTOR) 10 MG tablet Take 1 tablet (10 mg total) by mouth Dyer. 90 tablet 3  . senna-docusate (SENOKOT-S) 8.6-50 MG tablet Take 1 tablet by mouth Dyer as needed for mild constipation. (Patient not taking: Reported on 10/21/2020)    . telmisartan (MICARDIS) 80 MG tablet Take 80 mg by mouth Dyer.     No current facility-administered medications for this visit.    Review of Systems  Constitutional: positive for anorexia, fatigue and weight loss Eyes: negative Ears, nose, mouth, throat, and face: negative Respiratory: positive for cough and dyspnea on exertion Cardiovascular: negative Gastrointestinal: positive for nausea Genitourinary:negative Integument/breast: negative Hematologic/lymphatic: negative Musculoskeletal:positive for muscle weakness Neurological: negative Behavioral/Psych: negative Endocrine: negative Allergic/Immunologic: negative  Physical Exam  ZYS:AYTKZ, healthy, no distress, well nourished, well developed and anxious SKIN: skin color, texture, turgor are normal, no rashes or significant lesions HEAD: Normocephalic, No masses, lesions, tenderness or abnormalities EYES: normal, PERRLA, Conjunctiva are pink and non-injected EARS: External ears normal, Canals clear OROPHARYNX:no exudate, no erythema and lips, buccal mucosa, and tongue normal  NECK: supple, no adenopathy, no JVD LYMPH:  no palpable lymphadenopathy, no hepatosplenomegaly BREAST:not examined LUNGS: clear to auscultation , and palpation HEART: regular rate & rhythm, no murmurs and no gallops ABDOMEN:abdomen soft, non-tender, normal bowel sounds and no masses or organomegaly BACK: No CVA tenderness, Range of motion is normal EXTREMITIES:no joint deformities, effusion, or inflammation, no edema  NEURO: alert & oriented x 3 with fluent speech, no focal motor/sensory deficits  PERFORMANCE STATUS: ECOG 1  LABORATORY DATA: Lab Results  Component Value Date   WBC 19.7 (H) 11/14/2020    HGB 10.1 (L) 11/14/2020   HCT 32.7 (L) 11/14/2020   MCV 87.7 11/14/2020   PLT 335 11/14/2020      Chemistry      Component Value Date/Time   NA 130 (L) 11/06/2020 0333   NA 137 02/18/2015 0000   K 3.5 11/06/2020 0333   CL 94 (L) 11/06/2020 0333   CO2 24 11/06/2020 0333   BUN 32 (H) 11/06/2020 0333   BUN 15 02/18/2015 0000   CREATININE 4.94 (H) 11/06/2020 0333   GLU 122 02/18/2015 0000      Component Value Date/Time   CALCIUM 9.4 11/06/2020 0333   CALCIUM 8.1 (L) 08/06/2020 0059   ALKPHOS 107 11/06/2020 0333   AST 26 11/06/2020 0333   ALT 17 11/06/2020 0333   BILITOT 0.7 11/06/2020 0333       RADIOGRAPHIC STUDIES: DG Chest 1 View  Result Date: 10/31/2020 CLINICAL DATA:  Status post thoracentesis EXAM: CHEST  1 VIEW  COMPARISON:  July 30, 2020. FINDINGS: Left pleural effusion smaller following thoracentesis. Small pleural effusion on each side. No pneumothorax. There is slight bibasilar atelectasis. Heart size and pulmonary vascular normal. No adenopathy. Central catheter tip is at the cavoatrial junction. There is aortic atherosclerosis. No bone lesions. IMPRESSION: Small pleural effusion currently on each side with mild bibasilar atelectasis. No pneumothorax. No consolidation. Stable cardiac silhouette. Aortic Atherosclerosis (ICD10-I70.0). Electronically Signed   By: Lowella Grip III M.D.   On: 10/31/2020 12:21   DG Chest 1 View  Result Date: 10/25/2020 CLINICAL DATA:  79 year old female with a history of thoracentesis EXAM: CHEST  1 VIEW COMPARISON:  10/24/2020, 10/23/2020 FINDINGS: Cardiomediastinal silhouette unchanged in size and contour with the heart borders partially obscured by overlying lung and pleural disease. Decreasing interlobular septal thickening. Unchanged right IJ tunneled hemodialysis catheter. Improved opacity at the right lung base with minimal blunting at the right costophrenic angle. No pneumothorax. Opacity at the left lung base is increased from  the most recent comparison, though not as severe as the study dated 10/23/2020. No pneumothorax on the left. IMPRESSION: Decreased right-sided pleural effusion with no pneumothorax status post right thoracentesis. Recurrent left-sided pleural effusion and associated atelectasis/consolidation. Unchanged right IJ tunneled hemodialysis catheter. Improving pulmonary edema. Electronically Signed   By: Corrie Mckusick D.O.   On: 10/25/2020 16:40   DG Chest 1 View  Result Date: 10/24/2020 CLINICAL DATA:  Post thoracentesis today EXAM: CHEST  1 VIEW COMPARISON:  10/23/2020 FINDINGS: Significant improvement in bilateral pleural effusions with near complete clearing of left pleural fluid and small residual right pleural effusion. Improved aeration in both lung bases persistent right lower lobe atelectasis. Negative for pneumothorax. Improvement in vascular congestion and edema. Dialysis catheter in the lower SVC unchanged. IMPRESSION: Improvement in bilateral pleural effusions and bilateral edema. No pneumothorax post thoracentesis. Electronically Signed   By: Franchot Gallo M.D.   On: 10/24/2020 11:21   DG Chest 2 View  Result Date: 10/21/2020 CLINICAL DATA:  79 year old female with shortness of breath and increased weakness. Missed hemodialysis today. EXAM: CHEST - 2 VIEW COMPARISON:  Portable chest 09/10/2020 and earlier. FINDINGS: AP and lateral views of the chest today. Stable right chest tunneled dialysis catheter. Moderate bilateral pleural effusions are increased since September, resembling similar effusions in August. Upper lung vascular congestion. Most mediastinal contours are obscured today. Visualized tracheal air column is within normal limits. No pneumothorax. No acute osseous abnormality identified. Abdominal Calcified aortic atherosclerosis. Paucity of bowel gas in the upper abdomen. IMPRESSION: Moderate bilateral pleural effusions are increased since September, with superimposed mild or developing  pulmonary interstitial edema. Electronically Signed   By: Genevie Ann M.D.   On: 10/21/2020 18:31   CT ANGIO CHEST PE W OR WO CONTRAST  Result Date: 11/02/2020 CLINICAL DATA:  Respiratory failure EXAM: CT ANGIOGRAPHY CHEST WITH CONTRAST TECHNIQUE: Multidetector CT imaging of the chest was performed using the standard protocol during bolus administration of intravenous contrast. Multiplanar CT image reconstructions and MIPs were obtained to evaluate the vascular anatomy. CONTRAST:  33m OMNIPAQUE IOHEXOL 350 MG/ML SOLN COMPARISON:  CT abdomen pelvis, 04/08/2020, 08/05/2017 FINDINGS: Cardiovascular: Satisfactory opacification of the pulmonary arteries. No evidence of pulmonary embolism. The left lower lobar pulmonary arteries are nearly occluded by a left hilar mass. Normal heart size. Left coronary artery calcifications. No pericardial effusion. Aortic atherosclerosis. Mediastinum/Nodes: Numerous enlarged left hilar and mediastinal lymph nodes, with a left hilar mass. Largest discretely measurable lymph nodes measure up to 2.1 x 1.4  cm (series 8, image 56). Thyroid gland, trachea, and esophagus demonstrate no significant findings. Lungs/Pleura: Mild centrilobular emphysema. Small to moderate, left greater than right pleural effusions and associated atelectasis or consolidation. There is a left hilar mass measuring approximately 5.2 x 4.0 cm, which obstructs the left lower lobar bronchus near the origin (series 9, image 208). There is a subpleural nodule of the posterior left apex measuring 1.4 x 1.3 cm (series 8, image 31) and an additional subpleural nodule of the high medial apex measuring 0.9 x 0.6 cm (series 8, image 25). Upper Abdomen: Partially imaged left adrenal nodule, not obviously changed compared to prior examination dated 04/08/2020 measuring 1.5 x 1.0 cm (series 9, image 39). Musculoskeletal: No chest wall abnormality. No acute or significant osseous findings. Review of the MIP images confirms the  above findings. IMPRESSION: 1. Negative examination for pulmonary embolism. 2. There is a left hilar mass measuring approximately 5.2 x 4.0 cm, which obstructs the left lower lobar bronchus near the origin as well as the left lower lobar pulmonary arteries. 3. Subpleural nodules of the left pulmonary apex. 4. Numerous enlarged left hilar and mediastinal lymph nodes. 5. Constellation of findings is consistent primary lung malignancy and nodal and left-sided pulmonary metastatic disease. 6. Small to moderate, left greater than right pleural effusions and associated atelectasis or consolidation. Left pleural effusion is presumed malignant given presence of subpleural pulmonary nodules, there is no direct evidence of right sided malignant effusion or metastatic disease. 7. Partially imaged left adrenal nodule, not obviously changed compared to prior examinations and previously better characterized as a benign adenoma. Aortic Atherosclerosis (ICD10-I70.0) and Emphysema (ICD10-J43.9). Electronically Signed   By: Eddie Candle M.D.   On: 11/02/2020 19:11   DG Chest Port 1 View  Result Date: 11/05/2020 CLINICAL DATA:  Patient status post left bronchoscopy today. EXAM: PORTABLE CHEST 1 VIEW COMPARISON:  Single-view of the chest 11/01/2019. FINDINGS: Negative for pneumothorax after bronchoscopy. Small bilateral pleural effusions and basilar atelectasis are worse on the left. Heart size is normal. Aortic atherosclerosis. Dialysis catheter is unchanged. IMPRESSION: Negative for pneumothorax after bronchoscopy. Small bilateral pleural effusions and basilar airspace disease, worse on the left. Electronically Signed   By: Inge Rise M.D.   On: 11/05/2020 12:17   DG CHEST PORT 1 VIEW  Result Date: 10/30/2020 CLINICAL DATA:  Shortness of breath EXAM: PORTABLE CHEST 1 VIEW COMPARISON:  October 28, 2020 FINDINGS: Central catheter tip is at the cavoatrial junction. No pneumothorax. There are pleural effusions  bilaterally with bibasilar atelectasis. Heart is mildly enlarged with pulmonary vascularity normal. No adenopathy. There is aortic atherosclerosis. No bone lesions. IMPRESSION: Central catheter as described without pneumothorax. Pleural effusions bilaterally with bibasilar atelectasis. It should be noted that superimposed pneumonia in the bases cannot be excluded by radiography. No new opacity appreciable. Stable cardiac silhouette. Aortic Atherosclerosis (ICD10-I70.0). Electronically Signed   By: Lowella Grip III M.D.   On: 10/30/2020 08:15   DG CHEST PORT 1 VIEW  Result Date: 10/28/2020 CLINICAL DATA:  Dyspnea, end-stage renal disease EXAM: PORTABLE CHEST 1 VIEW COMPARISON:  10/25/2020 FINDINGS: Right internal jugular hemodialysis catheter is seen with its tip at the superior right atrium. Pulmonary insufflation is normal. Large left and small right pleural effusions are present and have enlarged in size since prior examination. Superimposed mild interstitial pulmonary edema has progressed in the interval since prior examination. No pneumothorax. Cardiac size is at the upper limits of normal. No acute bone abnormality. IMPRESSION: Progressive cardiogenic failure or  volume overload with progressive interstitial pulmonary infiltrate and enlarging bilateral pleural effusions, left greater than right. Electronically Signed   By: Fidela Salisbury MD   On: 10/28/2020 06:42   DG CHEST PORT 1 VIEW  Result Date: 10/23/2020 CLINICAL DATA:  Shortness of breath. EXAM: PORTABLE CHEST 1 VIEW COMPARISON:  10/21/2020 FINDINGS: The heart is enlarged but stable. The right IJ dialysis catheter is stable. Interval development of diffuse interstitial pulmonary edema. Persistent bilateral pleural effusions. No pneumothorax. IMPRESSION: Interval development of diffuse interstitial pulmonary edema. Persistent pleural effusions. Electronically Signed   By: Marijo Sanes M.D.   On: 10/23/2020 07:15   VAS US DUPLEX DIALYSIS  ACCESS (AVF,AVG)  Result Date: 10/18/2020 DIALYSIS ACCESS Reason for Exam: Routine follow up. Access Site: Left Upper Extremity. Access Type: Brachial-cephalic AVF (29/51/8841). Performing Technologist: Ivan Croft  Examination Guidelines: A complete evaluation includes B-mode imaging, spectral Doppler, color Doppler, and power Doppler as needed of all accessible portions of each vessel. Unilateral testing is considered an integral part of a complete examination. Limited examinations for reoccurring indications may be performed as noted.  Findings: +--------------------+----------+-----------------+--------+ AVF                 PSV (cm/s)Flow Vol (mL/min)Comments +--------------------+----------+-----------------+--------+ Native artery inflow   205           788                +--------------------+----------+-----------------+--------+ AVF Anastomosis        823                              +--------------------+----------+-----------------+--------+  +------------+----------+-------------+----------+--------+ OUTFLOW VEINPSV (cm/s)Diameter (cm)Depth (cm)Describe +------------+----------+-------------+----------+--------+ Shoulder       224        0.58        0.83            +------------+----------+-------------+----------+--------+ Prox UA        158        0.61        0.48            +------------+----------+-------------+----------+--------+ Mid UA         158        0.73        0.34    branch  +------------+----------+-------------+----------+--------+ Dist UA        116        0.86        0.31            +------------+----------+-------------+----------+--------+  Diagnosing physician: Servando Snare MD Electronically signed by Servando Snare MD on 10/18/2020 at 10:15:31 AM.   --------------------------------------------------------------------------------   Final    ECHOCARDIOGRAM COMPLETE  Result Date: 10/29/2020    ECHOCARDIOGRAM REPORT   Patient Name:    Cassandra Dyer Date of Exam: 10/29/2020 Medical Rec #:  [id number]       Height:       62.0 in Accession #:    [id number]      Weight:       121.3 lb Date of Birth:  1941/10/25       BSA:          1.545 m Patient Age:    82 years        BP:           113/56 mmHg Patient Gender: F               HR:  83 bpm. Exam Location:  Inpatient Procedure: 2D Echo, Cardiac Doppler and Color Doppler Indications:    Fluid Overload  History:        Patient has no prior history of Echocardiogram examinations.                 Risk Factors:Hypertension and Dyslipidemia.  Sonographer:    Bernadene Person RDCS Referring Phys: [id number] [place]  1. Left ventricular ejection fraction, by estimation, is 60 to 65%. The left ventricle has normal function. The left ventricle has no regional wall motion abnormalities. Left ventricular diastolic parameters are consistent with Grade II diastolic dysfunction (pseudonormalization).  2. Right ventricular systolic function is normal. The right ventricular size is mildly enlarged. There is moderately elevated pulmonary artery systolic pressure. The estimated right ventricular systolic pressure is 83.2 mmHg.  3. The mitral valve is grossly normal. No evidence of mitral valve regurgitation. No evidence of mitral stenosis.  4. The aortic valve is abnormal. There is mild calcification of the aortic valve. Aortic valve regurgitation is not visualized. Mild aortic valve sclerosis is present, with no evidence of aortic valve stenosis.  5. The inferior vena cava is normal in size with greater than 50% respiratory variability, suggesting right atrial pressure of 3 mmHg. FINDINGS  Left Ventricle: Left ventricular ejection fraction, by estimation, is 60 to 65%. The left ventricle has normal function. The left ventricle has no regional wall motion abnormalities. The left ventricular internal cavity size was normal in size. There is  no left ventricular hypertrophy. Left ventricular diastolic  parameters are consistent with Grade II diastolic dysfunction (pseudonormalization). Right Ventricle: The right ventricular size is mildly enlarged. No increase in right ventricular wall thickness. Right ventricular systolic function is normal. There is moderately elevated pulmonary artery systolic pressure. The tricuspid regurgitant velocity is 3.50 m/s, and with an assumed right atrial pressure of 3 mmHg, the estimated right ventricular systolic pressure is 54.9 mmHg. Left Atrium: Left atrial size was normal in size. Right Atrium: Right atrial size was normal in size. Pericardium: There is no evidence of pericardial effusion. Mitral Valve: The mitral valve is grossly normal. No evidence of mitral valve regurgitation. No evidence of mitral valve stenosis. Tricuspid Valve: The tricuspid valve is normal in structure. Tricuspid valve regurgitation is mild . No evidence of tricuspid stenosis. Aortic Valve: The aortic valve is abnormal. There is mild calcification of the aortic valve. Aortic valve regurgitation is not visualized. Mild aortic valve sclerosis is present, with no evidence of aortic valve stenosis. Pulmonic Valve: The pulmonic valve was grossly normal. Pulmonic valve regurgitation is mild to moderate. No evidence of pulmonic stenosis. Aorta: The aortic root is normal in size and structure. Venous: The inferior vena cava is normal in size with greater than 50% respiratory variability, suggesting right atrial pressure of 3 mmHg. IAS/Shunts: No atrial level shunt detected by color flow Doppler. Additional Comments: A venous catheter is visualized in the right atrium. There is a moderate pleural effusion.  LEFT VENTRICLE PLAX 2D LVIDd:         4.17 cm  Diastology LVIDs:         2.21 cm  LV e' medial:    4.90 cm/s LV PW:         0.59 cm  LV E/e' medial:  22.0 LV IVS:        0.64 cm  LV e' lateral:   8.05 cm/s LVOT diam:     2.10 cm  LV E/e' lateral: 13.4  LV SV:         90 LV SV Index:   58 LVOT Area:     3.46  cm  RIGHT VENTRICLE TAPSE (M-mode): 1.9 cm LEFT ATRIUM             Index       RIGHT ATRIUM           Index LA diam:        3.70 cm 2.39 cm/m  RA Area:     14.50 cm LA Vol (A2C):   49.5 ml 32.03 ml/m RA Volume:   36.30 ml  23.49 ml/m LA Vol (A4C):   46.9 ml 30.35 ml/m LA Biplane Vol: 50.1 ml 32.42 ml/m  AORTIC VALVE LVOT Vmax:   145.00 cm/s LVOT Vmean:  93.500 cm/s LVOT VTI:    0.259 m  AORTA Ao Root diam: 2.70 cm Ao Asc diam:  2.10 cm MITRAL VALVE                TRICUSPID VALVE MV Area (PHT): 2.91 cm     TR Peak grad:   49.0 mmHg MV Decel Time: 261 msec     TR Vmax:        350.00 cm/s MV E velocity: 108.00 cm/s MV A velocity: 124.00 cm/s  SHUNTS MV E/A ratio:  0.87         Systemic VTI:  0.26 m                             Systemic Diam: 2.10 cm Cherlynn Kaiser MD Electronically signed by Cherlynn Kaiser MD Signature Date/Time: 10/29/2020/4:59:56 PM    Final    IR THORACENTESIS ASP PLEURAL SPACE W/IMG GUIDE  Result Date: 10/31/2020 INDICATION: Patient with end-stage renal disease, bilateral pleural effusions. Request is made for therapeutic thoracentesis. EXAM: ULTRASOUND GUIDED THERAPEUTIC RIGHT THORACENTESIS MEDICATIONS: 10 mL 1% lidocaine COMPLICATIONS: None immediate. PROCEDURE: An ultrasound guided thoracentesis was thoroughly discussed with the patient and questions answered. The benefits, risks, alternatives and complications were also discussed. The patient understands and wishes to proceed with the procedure. Written consent was obtained. Ultrasound was performed to localize and mark an adequate pocket of fluid in the right chest. The area was then prepped and draped in the normal sterile fashion. 1% Lidocaine was used for local anesthesia. Under ultrasound guidance a 6 Fr Safe-T-Centesis catheter was introduced. Thoracentesis was performed. The catheter was removed and a dressing applied. FINDINGS: A total of approximately 650 mL of amber fluid was removed. Samples were sent to the laboratory  as requested by the clinical team. IMPRESSION: Successful ultrasound guided therapeutic right thoracentesis yielding 650 mL of pleural fluid. Read by: Brynda Greathouse PA-C Electronically Signed   By: Jerilynn Mages.  Shick M.D.   On: 10/31/2020 16:13   IR THORACENTESIS ASP PLEURAL SPACE W/IMG GUIDE  Result Date: 10/25/2020 INDICATION: Shortness of breath. Right-sided pleural effusion. Request for therapeutic thoracentesis. EXAM: ULTRASOUND GUIDED RIGHT THORACENTESIS MEDICATIONS: 1% plain lidocaine, 5 mL COMPLICATIONS: None immediate. PROCEDURE: An ultrasound guided thoracentesis was thoroughly discussed with the patient and questions answered. The benefits, risks, alternatives and complications were also discussed. The patient understands and wishes to proceed with the procedure. Written consent was obtained. Ultrasound was performed to localize and mark an adequate pocket of fluid in the right chest. The area was then prepped and draped in the normal sterile fashion. 1% Lidocaine was used for local anesthesia. Under ultrasound guidance a 6 Fr  Safe-T-Centesis catheter was introduced. Thoracentesis was performed. The catheter was removed and a dressing applied. FINDINGS: A total of approximately 700 mL of clear yellow fluid was removed. IMPRESSION: Successful ultrasound guided right thoracentesis yielding 700 mL of pleural fluid. Read by: Ascencion Dike PA-C Electronically Signed   By: Aletta Edouard M.D.   On: 10/25/2020 16:35   IR THORACENTESIS ASP PLEURAL SPACE W/IMG GUIDE  Result Date: 10/24/2020 INDICATION: Patient with ESRD in fluid overload, acute pulmonary edema, dyspnea, and bilateral pleural effusions, L>R. Request is made for diagnostic and therapeutic left thoracentesis. EXAM: ULTRASOUND GUIDED DIAGNOSTIC AND THERAPEUTIC LEFT THORACENTESIS MEDICATIONS: 10 mL 1% lidocaine COMPLICATIONS: None immediate. PROCEDURE: An ultrasound guided thoracentesis was thoroughly discussed with the patient and questions  answered. The benefits, risks, alternatives and complications were also discussed. The patient understands and wishes to proceed with the procedure. Written consent was obtained. Ultrasound was performed to localize and mark an adequate pocket of fluid in the left chest. The area was then prepped and draped in the normal sterile fashion. 1% Lidocaine was used for local anesthesia. Under ultrasound guidance a 6 Fr Safe-T-Centesis catheter was introduced. Thoracentesis was performed. The catheter was removed and a dressing applied. FINDINGS: A total of approximately 1 L of clear gold fluid was removed. Samples were sent to the laboratory as requested by the clinical team. IMPRESSION: Successful ultrasound guided left thoracentesis yielding 1 L of pleural fluid. Read by: Earley Abide, PA-C Electronically Signed   By: Ruthann Cancer MD   On: 10/24/2020 11:54   DG C-ARM BRONCHOSCOPY  Result Date: 11/05/2020 C-ARM BRONCHOSCOPY: Fluoroscopy was utilized by the requesting physician.  No radiographic interpretation.    ASSESSMENT: This is a very pleasant 79 years old African-American female recently diagnosed with stage IV (T3, N2, M1 a) non-small cell lung cancer, adenocarcinoma presented with large left lower lobe lung mass in addition to left hilar and mediastinal lymphadenopathy as well as pleural-based metastasis in the left lung diagnosed in November 2021 pending further staging work-up.   PLAN: I had a lengthy discussion with the patient and her sister today about her current disease stage, prognosis and treatment options. I recommended for the patient to complete the staging work-up by ordering MRI of the brain as well as PET scan to rule out any other metastatic disease. I also recommended for the patient to send blood test as well as tissue to [facility] for molecular studies and PD-L1 expression. I will arrange for the patient to come back for follow-up visit in around 2-3 weeks for evaluation and  discussion of her treatment options based on the follow staging work-up and molecular studies. For the end-stage renal disease, she will continue her current treatment with hemodialysis on Monday, Wednesday and Friday. The patient was advised to call immediately if she has any concerning symptoms in the interval. The patient voices understanding of current disease status and treatment options and is in agreement with the current care plan.  All questions were answered. The patient knows to call the clinic with any problems, questions or concerns. We can certainly see the patient much sooner if necessary.  Thank you so much for allowing me to participate in the care of Cassandra Dyer. I will continue to follow up the patient with you and assist in her care.   The total time spent in the appointment was 70 minutes.  Disclaimer: This note was dictated with voice recognition software. Similar sounding words can inadvertently be transcribed and may  not be corrected upon review.   Eilleen Kempf November 14, 2020, 2:02 PM

## 2020-11-14 NOTE — Telephone Encounter (Signed)
CRITICAL VALUE STICKER  CRITICAL VALUE: Creatinine = 4.4  RECEIVER (on-site recipient of call): Yetta Glassman, CMA  DATE & TIME NOTIFIED: 11/14/20 at 2:05pm  MESSENGER (representative from lab): Hillary  MD NOTIFIED: Dr. Julien Nordmann  TIME OF NOTIFICATION: 2:06pm  RESPONSE: Per Dr. Julien Nordmann, no action needed here at this time as pt has know kidney failure.

## 2020-11-14 NOTE — Progress Notes (Signed)
The proposed treatment discussed in cancer conference 12/2 is for discussion purpose only and is not a binding recommendation.  The patient was not physically examined nor present for their treatment options.  Therefore, final treatment plans cannot be decided.

## 2020-11-15 ENCOUNTER — Other Ambulatory Visit: Payer: Self-pay | Admitting: *Deleted

## 2020-11-15 ENCOUNTER — Encounter: Payer: Self-pay | Admitting: *Deleted

## 2020-11-15 DIAGNOSIS — D631 Anemia in chronic kidney disease: Secondary | ICD-10-CM | POA: Diagnosis not present

## 2020-11-15 DIAGNOSIS — Z992 Dependence on renal dialysis: Secondary | ICD-10-CM | POA: Diagnosis not present

## 2020-11-15 DIAGNOSIS — N2581 Secondary hyperparathyroidism of renal origin: Secondary | ICD-10-CM | POA: Diagnosis not present

## 2020-11-15 DIAGNOSIS — N186 End stage renal disease: Secondary | ICD-10-CM | POA: Diagnosis not present

## 2020-11-15 NOTE — Patient Outreach (Signed)
[facility]) Care Management [facility] Telephone Outreach, PCP office completes Transition of Care follow up post-hospital discharge Post-hospital discharge day # 9 Unsuccessful (consecutive) outreach attempt # 2- new referral  11/15/2020  Cassandra Dyer 06/09/1941 [id number]  Unsuccessful (consecutive) second outreach attempt to Cassandra Dyer, 79 y/o female referred to [place] [facility] 09/12/20 by [facility] Liaison after patient experienced 2 recent hospitalizations September 27-30, 2021 for N/V/D with malaise and weakness; November 9-24, 2021 for volume overload after patient missed established hemodialysis session.  During most recent hospitalization, (L) hilar mass with pulmonary nodules concerning for malignancy discovered during this hospital visit and patient was diagnosed with lung cancer.  She was discharged to home/ self-care with home health services recommended, which patient has refused.  Patient has history including, but not limited to, renal cell cancer with (R) nephrectomy 2018; ESRD- on hemodialysis since 07/2020; COPD- on home O2 at baseline at 2 L/min; HTN/ HLD.  HIPAA compliant voice mail message left for patient, requesting return call back.  Plan:  Verified [facility] unsuccessful patient outreach letter in mail requesting call back in writing 11/11/20  Will re-attempt [facility] telephone outreach within 4 business days if I do not hear back from patient/ caregiver first  [name], RN, BSN, [place] [facility]  [phone]

## 2020-11-16 ENCOUNTER — Encounter: Payer: Self-pay | Admitting: Internal Medicine

## 2020-11-16 DIAGNOSIS — Z7189 Other specified counseling: Secondary | ICD-10-CM | POA: Insufficient documentation

## 2020-11-16 DIAGNOSIS — Z5111 Encounter for antineoplastic chemotherapy: Secondary | ICD-10-CM | POA: Insufficient documentation

## 2020-11-16 DIAGNOSIS — C3492 Malignant neoplasm of unspecified part of left bronchus or lung: Secondary | ICD-10-CM | POA: Insufficient documentation

## 2020-11-18 ENCOUNTER — Other Ambulatory Visit: Payer: Self-pay | Admitting: *Deleted

## 2020-11-18 ENCOUNTER — Other Ambulatory Visit: Payer: Self-pay

## 2020-11-18 ENCOUNTER — Inpatient Hospital Stay (HOSPITAL_BASED_OUTPATIENT_CLINIC_OR_DEPARTMENT_OTHER): Payer: Medicare Other | Admitting: Medical

## 2020-11-18 ENCOUNTER — Telehealth: Payer: Self-pay | Admitting: Radiation Oncology

## 2020-11-18 ENCOUNTER — Telehealth: Payer: Self-pay | Admitting: *Deleted

## 2020-11-18 ENCOUNTER — Ambulatory Visit (HOSPITAL_COMMUNITY)
Admission: RE | Admit: 2020-11-18 | Discharge: 2020-11-18 | Disposition: A | Discharge status: Home / Self Care | Payer: Medicare Other | Source: Ambulatory Visit | Attending: Medical | Admitting: Medical

## 2020-11-18 VITALS — BP 141/68 | HR 112 | Temp 98.0°F | Resp 17 | Ht 62.0 in

## 2020-11-18 DIAGNOSIS — D259 Leiomyoma of uterus, unspecified: Secondary | ICD-10-CM | POA: Diagnosis not present

## 2020-11-18 DIAGNOSIS — D72823 Leukemoid reaction: Secondary | ICD-10-CM | POA: Diagnosis not present

## 2020-11-18 DIAGNOSIS — D631 Anemia in chronic kidney disease: Secondary | ICD-10-CM | POA: Diagnosis not present

## 2020-11-18 DIAGNOSIS — R5381 Other malaise: Secondary | ICD-10-CM | POA: Diagnosis not present

## 2020-11-18 DIAGNOSIS — C3432 Malignant neoplasm of lower lobe, left bronchus or lung: Secondary | ICD-10-CM | POA: Diagnosis present

## 2020-11-18 DIAGNOSIS — C3490 Malignant neoplasm of unspecified part of unspecified bronchus or lung: Secondary | ICD-10-CM | POA: Diagnosis not present

## 2020-11-18 DIAGNOSIS — S72041A Displaced fracture of base of neck of right femur, initial encounter for closed fracture: Secondary | ICD-10-CM | POA: Diagnosis not present

## 2020-11-18 DIAGNOSIS — M47819 Spondylosis without myelopathy or radiculopathy, site unspecified: Secondary | ICD-10-CM | POA: Diagnosis not present

## 2020-11-18 DIAGNOSIS — Z992 Dependence on renal dialysis: Secondary | ICD-10-CM | POA: Diagnosis not present

## 2020-11-18 DIAGNOSIS — C3492 Malignant neoplasm of unspecified part of left bronchus or lung: Secondary | ICD-10-CM

## 2020-11-18 DIAGNOSIS — R630 Anorexia: Secondary | ICD-10-CM | POA: Diagnosis not present

## 2020-11-18 DIAGNOSIS — R531 Weakness: Secondary | ICD-10-CM | POA: Diagnosis not present

## 2020-11-18 DIAGNOSIS — N186 End stage renal disease: Secondary | ICD-10-CM | POA: Diagnosis not present

## 2020-11-18 DIAGNOSIS — R0600 Dyspnea, unspecified: Secondary | ICD-10-CM | POA: Diagnosis not present

## 2020-11-18 DIAGNOSIS — R000 Tachycardia, unspecified: Secondary | ICD-10-CM | POA: Diagnosis not present

## 2020-11-18 DIAGNOSIS — Z923 Personal history of irradiation: Secondary | ICD-10-CM | POA: Diagnosis not present

## 2020-11-18 DIAGNOSIS — J811 Chronic pulmonary edema: Secondary | ICD-10-CM | POA: Diagnosis not present

## 2020-11-18 DIAGNOSIS — M25551 Pain in right hip: Secondary | ICD-10-CM | POA: Insufficient documentation

## 2020-11-18 DIAGNOSIS — J90 Pleural effusion, not elsewhere classified: Secondary | ICD-10-CM | POA: Diagnosis not present

## 2020-11-18 DIAGNOSIS — G936 Cerebral edema: Secondary | ICD-10-CM | POA: Diagnosis not present

## 2020-11-18 DIAGNOSIS — I1311 Hypertensive heart and chronic kidney disease without heart failure, with stage 5 chronic kidney disease, or end stage renal disease: Secondary | ICD-10-CM | POA: Diagnosis not present

## 2020-11-18 DIAGNOSIS — Z7901 Long term (current) use of anticoagulants: Secondary | ICD-10-CM | POA: Diagnosis not present

## 2020-11-18 DIAGNOSIS — Z8619 Personal history of other infectious and parasitic diseases: Secondary | ICD-10-CM | POA: Diagnosis not present

## 2020-11-18 DIAGNOSIS — Z20822 Contact with and (suspected) exposure to covid-19: Secondary | ICD-10-CM | POA: Diagnosis not present

## 2020-11-18 DIAGNOSIS — R0602 Shortness of breath: Secondary | ICD-10-CM | POA: Insufficient documentation

## 2020-11-18 DIAGNOSIS — R5383 Other fatigue: Secondary | ICD-10-CM | POA: Diagnosis not present

## 2020-11-18 DIAGNOSIS — I700 Atherosclerosis of aorta: Secondary | ICD-10-CM | POA: Diagnosis not present

## 2020-11-18 DIAGNOSIS — Z66 Do not resuscitate: Secondary | ICD-10-CM | POA: Diagnosis not present

## 2020-11-18 DIAGNOSIS — E875 Hyperkalemia: Secondary | ICD-10-CM | POA: Diagnosis not present

## 2020-11-18 DIAGNOSIS — C7931 Secondary malignant neoplasm of brain: Secondary | ICD-10-CM | POA: Diagnosis not present

## 2020-11-18 DIAGNOSIS — N250 Renal osteodystrophy: Secondary | ICD-10-CM | POA: Diagnosis not present

## 2020-11-18 DIAGNOSIS — Z85528 Personal history of other malignant neoplasm of kidney: Secondary | ICD-10-CM | POA: Diagnosis not present

## 2020-11-18 DIAGNOSIS — I120 Hypertensive chronic kidney disease with stage 5 chronic kidney disease or end stage renal disease: Secondary | ICD-10-CM | POA: Diagnosis not present

## 2020-11-18 DIAGNOSIS — M1611 Unilateral primary osteoarthritis, right hip: Secondary | ICD-10-CM | POA: Diagnosis not present

## 2020-11-18 DIAGNOSIS — Z79899 Other long term (current) drug therapy: Secondary | ICD-10-CM | POA: Diagnosis not present

## 2020-11-18 DIAGNOSIS — Z905 Acquired absence of kidney: Secondary | ICD-10-CM | POA: Diagnosis not present

## 2020-11-18 LAB — CBC WITH DIFFERENTIAL (CANCER CENTER ONLY)
Abs Immature Granulocytes: 0.3 10*3/uL — ABNORMAL HIGH (ref 0.00–0.07)
Basophils Absolute: 0.1 10*3/uL (ref 0.0–0.1)
Basophils Relative: 0 %
Eosinophils Absolute: 0.1 10*3/uL (ref 0.0–0.5)
Eosinophils Relative: 0 %
HCT: 34.4 % — ABNORMAL LOW (ref 36.0–46.0)
Hemoglobin: 10.3 g/dL — ABNORMAL LOW (ref 12.0–15.0)
Immature Granulocytes: 1 %
Lymphocytes Relative: 3 %
Lymphs Abs: 1.1 10*3/uL (ref 0.7–4.0)
MCH: 26.5 pg (ref 26.0–34.0)
MCHC: 29.9 g/dL — ABNORMAL LOW (ref 30.0–36.0)
MCV: 88.7 fL (ref 80.0–100.0)
Monocytes Absolute: 1.7 10*3/uL — ABNORMAL HIGH (ref 0.1–1.0)
Monocytes Relative: 5 %
Neutro Abs: 30.5 10*3/uL — ABNORMAL HIGH (ref 1.7–7.7)
Neutrophils Relative %: 91 %
Platelet Count: 412 10*3/uL — ABNORMAL HIGH (ref 150–400)
RBC: 3.88 MIL/uL (ref 3.87–5.11)
RDW: 15.8 % — ABNORMAL HIGH (ref 11.5–15.5)
WBC Count: 33.8 10*3/uL — ABNORMAL HIGH (ref 4.0–10.5)
nRBC: 0 % (ref 0.0–0.2)

## 2020-11-18 LAB — CMP (CANCER CENTER ONLY)
ALT: 18 U/L (ref 0–44)
AST: 25 U/L (ref 15–41)
Albumin: 2.1 g/dL — ABNORMAL LOW (ref 3.5–5.0)
Alkaline Phosphatase: 163 U/L — ABNORMAL HIGH (ref 38–126)
Anion gap: 12 (ref 5–15)
BUN: 40 mg/dL — ABNORMAL HIGH (ref 8–23)
CO2: 32 mmol/L (ref 22–32)
Calcium: 10.5 mg/dL — ABNORMAL HIGH (ref 8.9–10.3)
Chloride: 94 mmol/L — ABNORMAL LOW (ref 98–111)
Creatinine: 7.9 mg/dL (ref 0.44–1.00)
GFR, Estimated: 5 mL/min — ABNORMAL LOW (ref >60)
Glucose, Bld: 140 mg/dL — ABNORMAL HIGH (ref 70–99)
Potassium: 5.5 mmol/L — ABNORMAL HIGH (ref 3.5–5.1)
Sodium: 138 mmol/L (ref 135–145)
Total Bilirubin: 0.5 mg/dL (ref 0.3–1.2)
Total Protein: 6.9 g/dL (ref 6.5–8.1)

## 2020-11-18 MED ORDER — AMOXICILLIN-POT CLAVULANATE 875-125 MG PO TABS: 1.0000 | ORAL_TABLET | Freq: Two times a day (BID) | ORAL | 0 refills | Status: DC

## 2020-11-18 NOTE — Progress Notes (Signed)
[facility] Progress Note   Cassandra Dyer [id number] [birth year] 79 y.o.  Cassandra Dyer is managed by Dr. Fanny Bien. Cassandra Dyer  Actively treated with chemotherapy/immunotherapy/hormonal therapy: no  Next scheduled appointment with provider: to be scheduled  Assessment: Plan:    Shortness of breath - Plan: DG Chest Bilateral Decubitus  Hip pain, acute, right - Plan: DG Hip Unilat W or W/O Pelvis 1 View Right  Adenocarcinoma of left lung, stage 4 (HCC)  Leukemoid reaction   Shortness of breath: Patient has a history of a pleural effusion.  She was referred for a bilateral decubitus x-rays today which returned showing  FINDINGS: Bilateral lateral decubitus views of the chest are provided.  Both sides demonstrate layering pleural fluid, fairly symmetric with fluid to about 1.5 cm thickness on both sides.  This is similar to the CTA chest 11/02/2020.  The right side non dependent costophrenic angle clears better than the left.  Grossly stable mediastinal contours, and right chest dual lumen dialysis type catheter remains in place.  IMPRESSION: Fairly symmetric bilateral layering pleural effusions, fluid volume likely not significantly changed from the CTA 11/02/2020.   Right hip pain: The patient was referred for an x-ray of her right hip which returned showing:  FINDINGS: There is a minimally displaced right femoral neck fracture.  The fracture appears to extend from the superior mid neck toward the inferior subcapital neck region.  The right femoral head remains normally located.  The right femur intertrochanteric segment and visible proximal femoral shaft appear intact.  Bone mineralization about the pelvis and proximal femurs does not appear significantly changed from the April CT.  No destructive osseous lesion identified radiographically.  Pelvis and proximal left femur appear intact.  Chronic calcified uterine fibroid again noted.  Negative  visible other lower abdominal and pelvic visceral contours.  IMPRESSION: 1. Minimally displaced right femoral neck fracture, suspicious for pathologic fracture in this setting. 2. MRI versus whole-body bone scan would probably be most sensitive for detection of osseous metastatic disease.   Metastatic adenocarcinoma of the left lung: Cassandra Dyer is scheduled to have a brain MRI and a PET scan on 11/26/2020.  No follow-up appointment has yet been scheduled with Dr. Earlie Server.   Leukocytosis: A CBC returned today with a WBC of 33.8 with an [place] of 30.5.  The patient has no overt symptoms of an infection.  She has had an elevated white blood cell count in the past that is ranged from 15.3 to 25.2.  This certainly could represent a paraneoplastic process.  The patient was however placed on Augmentin 875-125 p.o. twice daily x7 days.  She was told to eat yogurt while she is taking this antibiotic.   Please see After Visit Summary for patient specific instructions.  Future Appointments  Date Time Provider [place]  11/26/2020  2:00 PM [facility] PET CT 1 [facility] [place]  11/26/2020  4:00 PM [facility] 1 [facility] [place]  12/03/2020  2:00 PM Price ADVISOR [facility] None  02/11/2021  2:20 PM Jenny Reichmann Hunt Oris, MD [facility] None    Orders Placed This Encounter  Procedures  . DG Chest Bilateral Decubitus  . DG Hip Unilat W or W/O Pelvis 1 View Right       Subjective:   Patient ID:  Cassandra Dyer is a 79 y.o. (DOB 1941-05-16) female.  Chief Complaint: No chief complaint on file.   HPI Cassandra Dyer is a 79 y.o. female with a diagnosis  of a newly diagnosed metastatic adenocarcinoma of the left lung.  She has previously had a thoracentesis completed.  She presents to the clinic today with new onset of right hip pain which is unrelated to activity.  Additionally she has had no trauma.  She did not go to her dialysis appointment today given that she was having significant right hip  pain.  She reports having mild diarrhea and mild shortness of breath.  She denies fevers, chills, sweats, nausea, vomiting, or a productive cough.   Medications: I have reviewed the patient's current medications.  Allergies: No Known Allergies  Past Medical History:  Diagnosis Date  . Anemia   . ESRD on hemodialysis ([place])   . Hepatitis    hx of hep C - 10-20 years ago   . History of blood transfusion   . History of kidney cancer   . Hyperlipidemia   . Hypertension    not taken b/p med in 2-3 years md deceased never went to another md     Past Surgical History:  Procedure Laterality Date  . AV FISTULA PLACEMENT Left 08/07/2020   Procedure: LEFT ARM ARTERIOVENOUS (AV) FISTULA CREATION;  Surgeon: [name], MD;  Location: [place];  Service: Vascular;  Laterality: Left;  . BRONCHIAL BIOPSY  11/05/2020   Procedure: BRONCHIAL BIOPSIES;  Surgeon: Collene Gobble, MD;  Location: [facility] ENDOSCOPY;  Service: Cardiopulmonary;;  . BRONCHIAL BRUSHINGS  11/05/2020   Procedure: BRONCHIAL BRUSHINGS;  Surgeon: Collene Gobble, MD;  Location: [place];  Service: Cardiopulmonary;;  . BRONCHIAL NEEDLE ASPIRATION BIOPSY  11/05/2020   Procedure: BRONCHIAL NEEDLE ASPIRATION BIOPSIES;  Surgeon: Collene Gobble, MD;  Location: [place];  Service: Cardiopulmonary;;  . CATARACT EXTRACTION Bilateral    11/30 left /12/9 right  . ELECTROMAGNETIC NAVIGATION BROCHOSCOPY N/A 11/05/2020   Procedure: ELECTROMAGNETIC NAVIGATION BRONCHOSCOPY;  Surgeon: Collene Gobble, MD;  Location: [place];  Service: Cardiopulmonary;  Laterality: N/A;  . ENDOBRONCHIAL ULTRASOUND N/A 11/05/2020   Procedure: ENDOBRONCHIAL ULTRASOUND;  Surgeon: Collene Gobble, MD;  [facility];  Service: Cardiopulmonary;  Laterality: N/A;  . FINE NEEDLE ASPIRATION  11/05/2020   Procedure: FINE NEEDLE ASPIRATION (FNA) LINEAR;  Surgeon: Collene Gobble, MD;  Location: [place] ENDOSCOPY;  Service: Cardiopulmonary;;  . IR FLUORO GUIDE  CV LINE RIGHT  07/27/2020  . IR THORACENTESIS ASP PLEURAL SPACE W/IMG GUIDE  10/24/2020  . IR THORACENTESIS ASP PLEURAL SPACE W/IMG GUIDE  10/25/2020  . IR THORACENTESIS ASP PLEURAL SPACE W/IMG GUIDE  10/31/2020  . IR US GUIDE VASC ACCESS RIGHT  07/27/2020  . ROBOT ASSISTED LAPAROSCOPIC NEPHRECTOMY Right 01/13/2017   Procedure: XI ROBOTIC ASSISTED LAPAROSCOPIC RADICAL NEPHRECTOMY WITH LYSIS OF ADHESION;  Surgeon: Alexis Frock, MD;  [facility];  Service: Urology;  Laterality: Right;  Marland Kitchen VIDEO BRONCHOSCOPY N/A 11/05/2020   Procedure: VIDEO BRONCHOSCOPY WITH FLUORO;  Surgeon: Collene Gobble, MD;  Location: [facility] ENDOSCOPY;  Service: Cardiopulmonary;  Laterality: N/A;    Family History  Problem Relation Age of Onset  . Hypertension Mother   . Pancreatic cancer Father   . Breast cancer Sister     Social History   Socioeconomic History  . Marital status: Significant Other    Spouse name: Not on file  . Number of children: 0  . Years of education: 9  . Highest education level: Not on file  Occupational History  . Occupation: Retired  Tobacco Use  . Smoking status: Former Smoker    Packs/day: 0.50  Years: 30.00    Pack years: 15.00    Types: Cigarettes    Quit date: 12/14/2000    Years since quitting: 19.9  . Smokeless tobacco: Never Used  Vaping Use  . Vaping Use: Never used  Substance and Sexual Activity  . Alcohol use: Yes    Comment: occasional wine   . Drug use: No  . Sexual activity: Not Currently  Other Topics Concern  . Not on file  Social History Narrative   Fun/Hobby: Watching TV and crossword puzzles   Social Determinants of Health   Financial Resource Strain:   . Difficulty of Paying Living Expenses: Not on file  Food Insecurity: No Food Insecurity  . Worried About Charity fundraiser in the Last Year: Never true  . Ran Out of Food in the Last Year: Never true  Transportation Needs: No Transportation Needs  . Lack of Transportation (Medical): No  . Lack  of Transportation (Non-Medical): No  Physical Activity:   . Days of Exercise per Week: Not on file  . Minutes of Exercise per Session: Not on file  Stress:   . Feeling of Stress : Not on file  Social Connections:   . Frequency of Communication with Friends and Family: Not on file  . Frequency of Social Gatherings with Friends and Family: Not on file  . Attends Religious Services: Not on file  . Active Member of Clubs or Organizations: Not on file  . Attends Archivist Meetings: Not on file  . Marital Status: Not on file  Intimate Partner Violence:   . Fear of Current or Ex-Partner: Not on file  . Emotionally Abused: Not on file  . Physically Abused: Not on file  . Sexually Abused: Not on file    Past Medical History, Surgical history, Social history, and Family history were reviewed and updated as appropriate.   Please see review of systems for further details on the patient's review from today.   Review of Systems:  Review of Systems  Constitutional: Negative for appetite change, chills, diaphoresis and fever.  HENT: Negative for trouble swallowing.   Respiratory: Positive for shortness of breath. Negative for cough, choking, chest tightness, wheezing and stridor.   Cardiovascular: Negative for chest pain, palpitations and leg swelling.  Gastrointestinal: Positive for diarrhea. Negative for abdominal distention, abdominal pain, blood in stool, constipation, nausea and vomiting.  Genitourinary: Negative for decreased urine volume and difficulty urinating.  Musculoskeletal: Positive for arthralgias and gait problem.  Neurological: Negative for weakness.    Objective:   Physical Exam:  BP (!) 141/68 (BP Location: Right Arm, Patient Position: Sitting)   Pulse (!) 112   Temp 98 F (36.7 C) (Tympanic)   Resp 17   Ht 5\' 2"  (1.575 m)   SpO2 98%   BMI 20.23 kg/m  ECOG: 1  Physical Exam Constitutional:      General: She is not in acute distress.    Appearance:  She is not ill-appearing, toxic-appearing or diaphoretic.  HENT:     Head: Normocephalic and atraumatic.  Eyes:     General: No scleral icterus.       Right eye: No discharge.        Left eye: No discharge.     Conjunctiva/sclera: Conjunctivae normal.  Cardiovascular:     Rate and Rhythm: Regular rhythm. Tachycardia present.     Heart sounds: No murmur heard.  No friction rub.  Pulmonary:     Effort: Pulmonary effort is normal. No  respiratory distress.     Breath sounds: No wheezing, rhonchi or rales.  Musculoskeletal:        General: No swelling, tenderness, deformity or signs of injury.     Right lower leg: No edema.     Left lower leg: No edema.     Comments: Calves are bilaterally equal at 32.5 cm at 13 cm distal to the inferior pole of the patella.  Skin:    General: Skin is warm and dry.     Findings: No bruising, erythema or rash.  Neurological:     Coordination: Coordination normal.     Gait: Gait abnormal (The patient is ambulating with the use of a wheelchair.).  Psychiatric:        Mood and Affect: Mood normal.        Behavior: Behavior normal.        Thought Content: Thought content normal.        Judgment: Judgment normal.     Lab Review:     Component Value Date/Time   NA 138 11/18/2020 1338   NA 137 02/18/2015 0000   K 5.5 (H) 11/18/2020 1338   CL 94 (L) 11/18/2020 1338   CO2 32 11/18/2020 1338   GLUCOSE 140 (H) 11/18/2020 1338   BUN 40 (H) 11/18/2020 1338   BUN 15 02/18/2015 0000   CREATININE 7.90 (HH) 11/18/2020 1338   CALCIUM 10.5 (H) 11/18/2020 1338   CALCIUM 8.1 (L) 08/06/2020 0059   PROT 6.9 11/18/2020 1338   ALBUMIN 2.1 (L) 11/18/2020 1338   AST 25 11/18/2020 1338   ALT 18 11/18/2020 1338   ALKPHOS 163 (H) 11/18/2020 1338   BILITOT 0.5 11/18/2020 1338   GFRNONAA 5 (L) 11/18/2020 1338   GFRAA 9 (L) 09/12/2020 0445       Component Value Date/Time   WBC 33.8 (H) 11/18/2020 1338   WBC 25.2 (H) 11/06/2020 0333   RBC 3.88 11/18/2020 1338    HGB 10.3 (L) 11/18/2020 1338   HCT 34.4 (L) 11/18/2020 1338   PLT 412 (H) 11/18/2020 1338   MCV 88.7 11/18/2020 1338   MCH 26.5 11/18/2020 1338   MCHC 29.9 (L) 11/18/2020 1338   RDW 15.8 (H) 11/18/2020 1338   LYMPHSABS 1.1 11/18/2020 1338   MONOABS 1.7 (H) 11/18/2020 1338   EOSABS 0.1 11/18/2020 1338   BASOSABS 0.1 11/18/2020 1338   -------------------------------  Imaging from last 24 hours (if applicable):  Radiology interpretation: DG Chest 1 View  Result Date: 10/31/2020 CLINICAL DATA:  Status post thoracentesis EXAM: CHEST  1 VIEW COMPARISON:  July 30, 2020. FINDINGS: Left pleural effusion smaller following thoracentesis. Small pleural effusion on each side. No pneumothorax. There is slight bibasilar atelectasis. Heart size and pulmonary vascular normal. No adenopathy. Central catheter tip is at the cavoatrial junction. There is aortic atherosclerosis. No bone lesions. IMPRESSION: Small pleural effusion currently on each side with mild bibasilar atelectasis. No pneumothorax. No consolidation. Stable cardiac silhouette. Aortic Atherosclerosis (ICD10-I70.0). Electronically Signed   By: Lowella Grip III M.D.   On: 10/31/2020 12:21   DG Chest 1 View  Result Date: 10/25/2020 CLINICAL DATA:  79 year old female with a history of thoracentesis EXAM: CHEST  1 VIEW COMPARISON:  10/24/2020, 10/23/2020 FINDINGS: Cardiomediastinal silhouette unchanged in size and contour with the heart borders partially obscured by overlying lung and pleural disease. Decreasing interlobular septal thickening. Unchanged right IJ tunneled hemodialysis catheter. Improved opacity at the right lung base with minimal blunting at the right costophrenic angle. No pneumothorax. Opacity  at the left lung base is increased from the most recent comparison, though not as severe as the study dated 10/23/2020. No pneumothorax on the left. IMPRESSION: Decreased right-sided pleural effusion with no pneumothorax status post  right thoracentesis. Recurrent left-sided pleural effusion and associated atelectasis/consolidation. Unchanged right IJ tunneled hemodialysis catheter. Improving pulmonary edema. Electronically Signed   By: Corrie Mckusick D.O.   On: 10/25/2020 16:40   DG Chest 1 View  Result Date: 10/24/2020 CLINICAL DATA:  Post thoracentesis today EXAM: CHEST  1 VIEW COMPARISON:  10/23/2020 FINDINGS: Significant improvement in bilateral pleural effusions with near complete clearing of left pleural fluid and small residual right pleural effusion. Improved aeration in both lung bases persistent right lower lobe atelectasis. Negative for pneumothorax. Improvement in vascular congestion and edema. Dialysis catheter in the lower SVC unchanged. IMPRESSION: Improvement in bilateral pleural effusions and bilateral edema. No pneumothorax post thoracentesis. Electronically Signed   By: Franchot Gallo M.D.   On: 10/24/2020 11:21   DG Chest 2 View  Result Date: 10/21/2020 CLINICAL DATA:  79 year old female with shortness of breath and increased weakness. Missed hemodialysis today. EXAM: CHEST - 2 VIEW COMPARISON:  Portable chest 09/10/2020 and earlier. FINDINGS: AP and lateral views of the chest today. Stable right chest tunneled dialysis catheter. Moderate bilateral pleural effusions are increased since September, resembling similar effusions in August. Upper lung vascular congestion. Most mediastinal contours are obscured today. Visualized tracheal air column is within normal limits. No pneumothorax. No acute osseous abnormality identified. Abdominal Calcified aortic atherosclerosis. Paucity of bowel gas in the upper abdomen. IMPRESSION: Moderate bilateral pleural effusions are increased since September, with superimposed mild or developing pulmonary interstitial edema. Electronically Signed   By: Genevie Ann M.D.   On: 10/21/2020 18:31   CT ANGIO CHEST PE W OR WO CONTRAST  Result Date: 11/02/2020 CLINICAL DATA:  Respiratory failure  EXAM: CT ANGIOGRAPHY CHEST WITH CONTRAST TECHNIQUE: Multidetector CT imaging of the chest was performed using the standard protocol during bolus administration of intravenous contrast. Multiplanar CT image reconstructions and MIPs were obtained to evaluate the vascular anatomy. CONTRAST:  61mL OMNIPAQUE IOHEXOL 350 MG/ML SOLN COMPARISON:  CT abdomen pelvis, 04/08/2020, 08/05/2017 FINDINGS: Cardiovascular: Satisfactory opacification of the pulmonary arteries. No evidence of pulmonary embolism. The left lower lobar pulmonary arteries are nearly occluded by a left hilar mass. Normal heart size. Left coronary artery calcifications. No pericardial effusion. Aortic atherosclerosis. Mediastinum/Nodes: Numerous enlarged left hilar and mediastinal lymph nodes, with a left hilar mass. Largest discretely measurable lymph nodes measure up to 2.1 x 1.4 cm (series 8, image 56). Thyroid gland, trachea, and esophagus demonstrate no significant findings. Lungs/Pleura: Mild centrilobular emphysema. Small to moderate, left greater than right pleural effusions and associated atelectasis or consolidation. There is a left hilar mass measuring approximately 5.2 x 4.0 cm, which obstructs the left lower lobar bronchus near the origin (series 9, image 208). There is a subpleural nodule of the posterior left apex measuring 1.4 x 1.3 cm (series 8, image 31) and an additional subpleural nodule of the high medial apex measuring 0.9 x 0.6 cm (series 8, image 25). Upper Abdomen: Partially imaged left adrenal nodule, not obviously changed compared to prior examination dated 04/08/2020 measuring 1.5 x 1.0 cm (series 9, image 39). Musculoskeletal: No chest wall abnormality. No acute or significant osseous findings. Review of the MIP images confirms the above findings. IMPRESSION: 1. Negative examination for pulmonary embolism. 2. There is a left hilar mass measuring approximately 5.2 x 4.0 cm, which  obstructs the left lower lobar bronchus near the  origin as well as the left lower lobar pulmonary arteries. 3. Subpleural nodules of the left pulmonary apex. 4. Numerous enlarged left hilar and mediastinal lymph nodes. 5. Constellation of findings is consistent primary lung malignancy and nodal and left-sided pulmonary metastatic disease. 6. Small to moderate, left greater than right pleural effusions and associated atelectasis or consolidation. Left pleural effusion is presumed malignant given presence of subpleural pulmonary nodules, there is no direct evidence of right sided malignant effusion or metastatic disease. 7. Partially imaged left adrenal nodule, not obviously changed compared to prior examinations and previously better characterized as a benign adenoma. Aortic Atherosclerosis (ICD10-I70.0) and Emphysema (ICD10-J43.9). Electronically Signed   By: Eddie Candle M.D.   On: 11/02/2020 19:11   DG Chest Bilateral Decubitus  Result Date: 11/18/2020 CLINICAL DATA:  79 year old female with history of pleural effusions and thoracentesis, chest CT last month revealing left hilar mass. Shortness of breath. New right hip pain. EXAM: CHEST - BILATERAL DECUBITUS VIEW COMPARISON:  Portable chest 11/05/2020. Chest CTA 11/02/2020, and earlier. FINDINGS: Bilateral lateral decubitus views of the chest are provided. Both sides demonstrate layering pleural fluid, fairly symmetric with fluid to about 1.5 cm thickness on both sides. This is similar to the CTA chest 11/02/2020. The right side non dependent costophrenic angle clears better than the left. Grossly stable mediastinal contours, and right chest dual lumen dialysis type catheter remains in place. IMPRESSION: Fairly symmetric bilateral layering pleural effusions, fluid volume likely not significantly changed from the CTA 11/02/2020. Electronically Signed   By: Genevie Ann M.D.   On: 11/18/2020 16:38   DG Chest Port 1 View  Result Date: 11/05/2020 CLINICAL DATA:  Patient status post left bronchoscopy today. EXAM:  PORTABLE CHEST 1 VIEW COMPARISON:  Single-view of the chest 11/01/2019. FINDINGS: Negative for pneumothorax after bronchoscopy. Small bilateral pleural effusions and basilar atelectasis are worse on the left. Heart size is normal. Aortic atherosclerosis. Dialysis catheter is unchanged. IMPRESSION: Negative for pneumothorax after bronchoscopy. Small bilateral pleural effusions and basilar airspace disease, worse on the left. Electronically Signed   By: Inge Rise M.D.   On: 11/05/2020 12:17   DG CHEST PORT 1 VIEW  Result Date: 10/30/2020 CLINICAL DATA:  Shortness of breath EXAM: PORTABLE CHEST 1 VIEW COMPARISON:  October 28, 2020 FINDINGS: Central catheter tip is at the cavoatrial junction. No pneumothorax. There are pleural effusions bilaterally with bibasilar atelectasis. Heart is mildly enlarged with pulmonary vascularity normal. No adenopathy. There is aortic atherosclerosis. No bone lesions. IMPRESSION: Central catheter as described without pneumothorax. Pleural effusions bilaterally with bibasilar atelectasis. It should be noted that superimposed pneumonia in the bases cannot be excluded by radiography. No new opacity appreciable. Stable cardiac silhouette. Aortic Atherosclerosis (ICD10-I70.0). Electronically Signed   By: Lowella Grip III M.D.   On: 10/30/2020 08:15   DG CHEST PORT 1 VIEW  Result Date: 10/28/2020 CLINICAL DATA:  Dyspnea, end-stage renal disease EXAM: PORTABLE CHEST 1 VIEW COMPARISON:  10/25/2020 FINDINGS: Right internal jugular hemodialysis catheter is seen with its tip at the superior right atrium. Pulmonary insufflation is normal. Large left and small right pleural effusions are present and have enlarged in size since prior examination. Superimposed mild interstitial pulmonary edema has progressed in the interval since prior examination. No pneumothorax. Cardiac size is at the upper limits of normal. No acute bone abnormality. IMPRESSION: Progressive cardiogenic failure  or volume overload with progressive interstitial pulmonary infiltrate and enlarging bilateral pleural effusions, left greater  than right. Electronically Signed   By: Fidela Salisbury MD   On: 10/28/2020 06:42   DG CHEST PORT 1 VIEW  Result Date: 10/23/2020 CLINICAL DATA:  Shortness of breath. EXAM: PORTABLE CHEST 1 VIEW COMPARISON:  10/21/2020 FINDINGS: The heart is enlarged but stable. The right IJ dialysis catheter is stable. Interval development of diffuse interstitial pulmonary edema. Persistent bilateral pleural effusions. No pneumothorax. IMPRESSION: Interval development of diffuse interstitial pulmonary edema. Persistent pleural effusions. Electronically Signed   By: Marijo Sanes M.D.   On: 10/23/2020 07:15   ECHOCARDIOGRAM COMPLETE  Result Date: 10/29/2020    ECHOCARDIOGRAM REPORT   Patient Name:   Cassandra Dyer Date of Exam: 10/29/2020 Medical Rec #:  [id number]       Height:       62.0 in Accession #:    [id number]      Weight:       121.3 lb Date of Birth:  10/18/1941       BSA:          1.545 m Patient Age:    55 years        BP:           113/56 mmHg Patient Gender: F               HR:           83 bpm. Exam Location:  Inpatient Procedure: 2D Echo, Cardiac Doppler and Color Doppler Indications:    Fluid Overload  History:        Patient has no prior history of Echocardiogram examinations.                 Risk Factors:Hypertension and Dyslipidemia.  Sonographer:    Bernadene Person RDCS Referring Phys: [id number] [place]  1. Left ventricular ejection fraction, by estimation, is 60 to 65%. The left ventricle has normal function. The left ventricle has no regional wall motion abnormalities. Left ventricular diastolic parameters are consistent with Grade II diastolic dysfunction (pseudonormalization).  2. Right ventricular systolic function is normal. The right ventricular size is mildly enlarged. There is moderately elevated pulmonary artery systolic pressure. The estimated right  ventricular systolic pressure is 25.8 mmHg.  3. The mitral valve is grossly normal. No evidence of mitral valve regurgitation. No evidence of mitral stenosis.  4. The aortic valve is abnormal. There is mild calcification of the aortic valve. Aortic valve regurgitation is not visualized. Mild aortic valve sclerosis is present, with no evidence of aortic valve stenosis.  5. The inferior vena cava is normal in size with greater than 50% respiratory variability, suggesting right atrial pressure of 3 mmHg. FINDINGS  Left Ventricle: Left ventricular ejection fraction, by estimation, is 60 to 65%. The left ventricle has normal function. The left ventricle has no regional wall motion abnormalities. The left ventricular internal cavity size was normal in size. There is  no left ventricular hypertrophy. Left ventricular diastolic parameters are consistent with Grade II diastolic dysfunction (pseudonormalization). Right Ventricle: The right ventricular size is mildly enlarged. No increase in right ventricular wall thickness. Right ventricular systolic function is normal. There is moderately elevated pulmonary artery systolic pressure. The tricuspid regurgitant velocity is 3.50 m/s, and with an assumed right atrial pressure of 3 mmHg, the estimated right ventricular systolic pressure is 52.7 mmHg. Left Atrium: Left atrial size was normal in size. Right Atrium: Right atrial size was normal in size. Pericardium: There is no evidence of pericardial effusion. Mitral Valve: The mitral  valve is grossly normal. No evidence of mitral valve regurgitation. No evidence of mitral valve stenosis. Tricuspid Valve: The tricuspid valve is normal in structure. Tricuspid valve regurgitation is mild . No evidence of tricuspid stenosis. Aortic Valve: The aortic valve is abnormal. There is mild calcification of the aortic valve. Aortic valve regurgitation is not visualized. Mild aortic valve sclerosis is present, with no evidence of aortic valve  stenosis. Pulmonic Valve: The pulmonic valve was grossly normal. Pulmonic valve regurgitation is mild to moderate. No evidence of pulmonic stenosis. Aorta: The aortic root is normal in size and structure. Venous: The inferior vena cava is normal in size with greater than 50% respiratory variability, suggesting right atrial pressure of 3 mmHg. IAS/Shunts: No atrial level shunt detected by color flow Doppler. Additional Comments: A venous catheter is visualized in the right atrium. There is a moderate pleural effusion.  LEFT VENTRICLE PLAX 2D LVIDd:         4.17 cm  Diastology LVIDs:         2.21 cm  LV e' medial:    4.90 cm/s LV PW:         0.59 cm  LV E/e' medial:  22.0 LV IVS:        0.64 cm  LV e' lateral:   8.05 cm/s LVOT diam:     2.10 cm  LV E/e' lateral: 13.4 LV SV:         90 LV SV Index:   58 LVOT Area:     3.46 cm  RIGHT VENTRICLE TAPSE (M-mode): 1.9 cm LEFT ATRIUM             Index       RIGHT ATRIUM           Index LA diam:        3.70 cm 2.39 cm/m  RA Area:     14.50 cm LA Vol (A2C):   49.5 ml 32.03 ml/m RA Volume:   36.30 ml  23.49 ml/m LA Vol (A4C):   46.9 ml 30.35 ml/m LA Biplane Vol: 50.1 ml 32.42 ml/m  AORTIC VALVE LVOT Vmax:   145.00 cm/s LVOT Vmean:  93.500 cm/s LVOT VTI:    0.259 m  AORTA Ao Root diam: 2.70 cm Ao Asc diam:  2.10 cm MITRAL VALVE                TRICUSPID VALVE MV Area (PHT): 2.91 cm     TR Peak grad:   49.0 mmHg MV Decel Time: 261 msec     TR Vmax:        350.00 cm/s MV E velocity: 108.00 cm/s MV A velocity: 124.00 cm/s  SHUNTS MV E/A ratio:  0.87         Systemic VTI:  0.26 m                             Systemic Diam: 2.10 cm Cherlynn Kaiser MD Electronically signed by Cherlynn Kaiser MD Signature Date/Time: 10/29/2020/4:59:56 PM    Final    DG Hip Unilat W or W/O Pelvis 1 View Right  Addendum Date: 11/18/2020   ADDENDUM REPORT: 11/18/2020 16:53 ADDENDUM: Study discussed by telephone with PA-C Leeann Bady on 11/18/2020 at 1645 hours. Electronically Signed   By: Genevie Ann  M.D.   On: 11/18/2020 16:53   Result Date: 11/18/2020 CLINICAL DATA:  79 year old female with history of pleural effusions and thoracentesis, chest CT last  month revealing left hilar mass. Shortness of breath. New right hip pain. EXAM: DG HIP (WITH OR WITHOUT PELVIS) 1V RIGHT COMPARISON:  CT Abdomen and Pelvis 04/08/2020. FINDINGS: There is a minimally displaced right femoral neck fracture. The fracture appears to extend from the superior mid neck toward the inferior subcapital neck region. The right femoral head remains normally located. The right femur intertrochanteric segment and visible proximal femoral shaft appear intact. Bone mineralization about the pelvis and proximal femurs does not appear significantly changed from the April CT. No destructive osseous lesion identified radiographically. Pelvis and proximal left femur appear intact. Chronic calcified uterine fibroid again noted. Negative visible other lower abdominal and pelvic visceral contours. IMPRESSION: 1. Minimally displaced right femoral neck fracture, suspicious for pathologic fracture in this setting. 2. MRI versus whole-body bone scan would probably be most sensitive for detection of osseous metastatic disease. Electronically Signed: By: Genevie Ann M.D. On: 11/18/2020 16:41   IR THORACENTESIS ASP PLEURAL SPACE W/IMG GUIDE  Result Date: 10/31/2020 INDICATION: Patient with end-stage renal disease, bilateral pleural effusions. Request is made for therapeutic thoracentesis. EXAM: ULTRASOUND GUIDED THERAPEUTIC RIGHT THORACENTESIS MEDICATIONS: 10 mL 1% lidocaine COMPLICATIONS: None immediate. PROCEDURE: An ultrasound guided thoracentesis was thoroughly discussed with the patient and questions answered. The benefits, risks, alternatives and complications were also discussed. The patient understands and wishes to proceed with the procedure. Written consent was obtained. Ultrasound was performed to localize and mark an adequate pocket of fluid in the  right chest. The area was then prepped and draped in the normal sterile fashion. 1% Lidocaine was used for local anesthesia. Under ultrasound guidance a 6 Fr Safe-T-Centesis catheter was introduced. Thoracentesis was performed. The catheter was removed and a dressing applied. FINDINGS: A total of approximately 650 mL of amber fluid was removed. Samples were sent to the laboratory as requested by the clinical team. IMPRESSION: Successful ultrasound guided therapeutic right thoracentesis yielding 650 mL of pleural fluid. Read by: Brynda Greathouse PA-C Electronically Signed   By: Jerilynn Mages.  Shick M.D.   On: 10/31/2020 16:13   IR THORACENTESIS ASP PLEURAL SPACE W/IMG GUIDE  Result Date: 10/25/2020 INDICATION: Shortness of breath. Right-sided pleural effusion. Request for therapeutic thoracentesis. EXAM: ULTRASOUND GUIDED RIGHT THORACENTESIS MEDICATIONS: 1% plain lidocaine, 5 mL COMPLICATIONS: None immediate. PROCEDURE: An ultrasound guided thoracentesis was thoroughly discussed with the patient and questions answered. The benefits, risks, alternatives and complications were also discussed. The patient understands and wishes to proceed with the procedure. Written consent was obtained. Ultrasound was performed to localize and mark an adequate pocket of fluid in the right chest. The area was then prepped and draped in the normal sterile fashion. 1% Lidocaine was used for local anesthesia. Under ultrasound guidance a 6 Fr Safe-T-Centesis catheter was introduced. Thoracentesis was performed. The catheter was removed and a dressing applied. FINDINGS: A total of approximately 700 mL of clear yellow fluid was removed. IMPRESSION: Successful ultrasound guided right thoracentesis yielding 700 mL of pleural fluid. Read by: Ascencion Dike PA-C Electronically Signed   By: Aletta Edouard M.D.   On: 10/25/2020 16:35   IR THORACENTESIS ASP PLEURAL SPACE W/IMG GUIDE  Result Date: 10/24/2020 INDICATION: Patient with ESRD in fluid  overload, acute pulmonary edema, dyspnea, and bilateral pleural effusions, L>R. Request is made for diagnostic and therapeutic left thoracentesis. EXAM: ULTRASOUND GUIDED DIAGNOSTIC AND THERAPEUTIC LEFT THORACENTESIS MEDICATIONS: 10 mL 1% lidocaine COMPLICATIONS: None immediate. PROCEDURE: An ultrasound guided thoracentesis was thoroughly discussed with the patient and questions answered. The benefits, risks, alternatives  and complications were also discussed. The patient understands and wishes to proceed with the procedure. Written consent was obtained. Ultrasound was performed to localize and mark an adequate pocket of fluid in the left chest. The area was then prepped and draped in the normal sterile fashion. 1% Lidocaine was used for local anesthesia. Under ultrasound guidance a 6 Fr Safe-T-Centesis catheter was introduced. Thoracentesis was performed. The catheter was removed and a dressing applied. FINDINGS: A total of approximately 1 L of clear gold fluid was removed. Samples were sent to the laboratory as requested by the clinical team. IMPRESSION: Successful ultrasound guided left thoracentesis yielding 1 L of pleural fluid. Read by: Earley Abide, PA-C Electronically Signed   By: Ruthann Cancer MD   On: 10/24/2020 11:54   DG C-ARM BRONCHOSCOPY  Result Date: 11/05/2020 C-ARM BRONCHOSCOPY: Fluoroscopy was utilized by the requesting physician.  No radiographic interpretation.

## 2020-11-18 NOTE — Progress Notes (Signed)
These results were reviewed with the patient. Dialysis patient. Missed dialysis today 2nd to right hip pain.   Sandi Mealy, [facility] Physician Assistant

## 2020-11-18 NOTE — Telephone Encounter (Signed)
Pt states she was going to the ED as she feels like fluid is building up around thigh, leg is hurting. "I had fluid removed when I was in the hospital." Did not go for dialysis today as she was " feeling so bad".  Will have labs at 1:30 and see Sandi Mealy, PA at 2:0.   Message to scheduler, labs ordered

## 2020-11-18 NOTE — Telephone Encounter (Signed)
Opened in error

## 2020-11-18 NOTE — Progress Notes (Signed)
CRITICAL VALUE ALERT  Critical Value: SCr 7.9  Date & Time Notied: 2:27 PM 11/18/20 by Verdis Frederickson  Provider Notified: Sandi Mealy, PA  Orders Received/Actions taken: PA will see patient.

## 2020-11-18 NOTE — Progress Notes (Signed)
Please note new right femoral fracture. I called the patient and told her to use a walker or wheelchair at home and avoid weight bearing. What would like to do next, MRI or bone scan.  Thanks, [name]

## 2020-11-19 ENCOUNTER — Other Ambulatory Visit: Payer: Self-pay | Admitting: Medical

## 2020-11-19 DIAGNOSIS — S72001A Fracture of unspecified part of neck of right femur, initial encounter for closed fracture: Secondary | ICD-10-CM

## 2020-11-19 NOTE — Telephone Encounter (Signed)
ERROR

## 2020-11-20 ENCOUNTER — Telehealth: Payer: Self-pay | Admitting: Internal Medicine

## 2020-11-20 ENCOUNTER — Emergency Department (HOSPITAL_COMMUNITY): Payer: Medicare Other

## 2020-11-20 ENCOUNTER — Encounter (HOSPITAL_COMMUNITY): Payer: Self-pay | Admitting: Emergency Medicine

## 2020-11-20 ENCOUNTER — Inpatient Hospital Stay (HOSPITAL_COMMUNITY): Payer: Medicare Other

## 2020-11-20 ENCOUNTER — Other Ambulatory Visit: Payer: Self-pay

## 2020-11-20 ENCOUNTER — Inpatient Hospital Stay (HOSPITAL_COMMUNITY)
Admission: EM | Admit: 2020-11-20 | Discharge: 2020-11-28 | DRG: 981 | Disposition: A | Discharge status: Home Health | Payer: Medicare Other | Attending: Internal Medicine | Admitting: Internal Medicine

## 2020-11-20 DIAGNOSIS — E876 Hypokalemia: Secondary | ICD-10-CM | POA: Diagnosis not present

## 2020-11-20 DIAGNOSIS — Z9981 Dependence on supplemental oxygen: Secondary | ICD-10-CM

## 2020-11-20 DIAGNOSIS — C3492 Malignant neoplasm of unspecified part of left bronchus or lung: Secondary | ICD-10-CM | POA: Diagnosis present

## 2020-11-20 DIAGNOSIS — D631 Anemia in chronic kidney disease: Secondary | ICD-10-CM | POA: Diagnosis present

## 2020-11-20 DIAGNOSIS — R630 Anorexia: Secondary | ICD-10-CM | POA: Diagnosis present

## 2020-11-20 DIAGNOSIS — N186 End stage renal disease: Secondary | ICD-10-CM | POA: Diagnosis present

## 2020-11-20 DIAGNOSIS — C7931 Secondary malignant neoplasm of brain: Secondary | ICD-10-CM | POA: Diagnosis not present

## 2020-11-20 DIAGNOSIS — Z800 Family history of malignant neoplasm of digestive organs: Secondary | ICD-10-CM

## 2020-11-20 DIAGNOSIS — Z9115 Patient's noncompliance with renal dialysis: Secondary | ICD-10-CM

## 2020-11-20 DIAGNOSIS — Z96641 Presence of right artificial hip joint: Secondary | ICD-10-CM | POA: Diagnosis not present

## 2020-11-20 DIAGNOSIS — Z96649 Presence of unspecified artificial hip joint: Secondary | ICD-10-CM

## 2020-11-20 DIAGNOSIS — Z803 Family history of malignant neoplasm of breast: Secondary | ICD-10-CM

## 2020-11-20 DIAGNOSIS — I10 Essential (primary) hypertension: Secondary | ICD-10-CM | POA: Diagnosis present

## 2020-11-20 DIAGNOSIS — J811 Chronic pulmonary edema: Secondary | ICD-10-CM | POA: Diagnosis not present

## 2020-11-20 DIAGNOSIS — E785 Hyperlipidemia, unspecified: Secondary | ICD-10-CM | POA: Diagnosis not present

## 2020-11-20 DIAGNOSIS — S72001A Fracture of unspecified part of neck of right femur, initial encounter for closed fracture: Secondary | ICD-10-CM | POA: Diagnosis not present

## 2020-11-20 DIAGNOSIS — Z681 Body mass index (BMI) 19 or less, adult: Secondary | ICD-10-CM | POA: Diagnosis not present

## 2020-11-20 DIAGNOSIS — E875 Hyperkalemia: Secondary | ICD-10-CM | POA: Diagnosis not present

## 2020-11-20 DIAGNOSIS — E8889 Other specified metabolic disorders: Secondary | ICD-10-CM | POA: Diagnosis present

## 2020-11-20 DIAGNOSIS — Z85528 Personal history of other malignant neoplasm of kidney: Secondary | ICD-10-CM

## 2020-11-20 DIAGNOSIS — R531 Weakness: Secondary | ICD-10-CM | POA: Diagnosis not present

## 2020-11-20 DIAGNOSIS — C641 Malignant neoplasm of right kidney, except renal pelvis: Secondary | ICD-10-CM | POA: Diagnosis not present

## 2020-11-20 DIAGNOSIS — C3490 Malignant neoplasm of unspecified part of unspecified bronchus or lung: Secondary | ICD-10-CM | POA: Diagnosis not present

## 2020-11-20 DIAGNOSIS — I517 Cardiomegaly: Secondary | ICD-10-CM | POA: Diagnosis present

## 2020-11-20 DIAGNOSIS — R000 Tachycardia, unspecified: Secondary | ICD-10-CM | POA: Diagnosis not present

## 2020-11-20 DIAGNOSIS — Z8249 Family history of ischemic heart disease and other diseases of the circulatory system: Secondary | ICD-10-CM

## 2020-11-20 DIAGNOSIS — M84451A Pathological fracture, right femur, initial encounter for fracture: Secondary | ICD-10-CM | POA: Diagnosis not present

## 2020-11-20 DIAGNOSIS — C7951 Secondary malignant neoplasm of bone: Secondary | ICD-10-CM | POA: Diagnosis not present

## 2020-11-20 DIAGNOSIS — Z66 Do not resuscitate: Secondary | ICD-10-CM | POA: Diagnosis not present

## 2020-11-20 DIAGNOSIS — M25551 Pain in right hip: Secondary | ICD-10-CM

## 2020-11-20 DIAGNOSIS — Z419 Encounter for procedure for purposes other than remedying health state, unspecified: Secondary | ICD-10-CM

## 2020-11-20 DIAGNOSIS — D72829 Elevated white blood cell count, unspecified: Secondary | ICD-10-CM | POA: Diagnosis not present

## 2020-11-20 DIAGNOSIS — G936 Cerebral edema: Secondary | ICD-10-CM | POA: Diagnosis present

## 2020-11-20 DIAGNOSIS — M84551A Pathological fracture in neoplastic disease, right femur, initial encounter for fracture: Secondary | ICD-10-CM | POA: Diagnosis not present

## 2020-11-20 DIAGNOSIS — D259 Leiomyoma of uterus, unspecified: Secondary | ICD-10-CM | POA: Diagnosis not present

## 2020-11-20 DIAGNOSIS — Z471 Aftercare following joint replacement surgery: Secondary | ICD-10-CM | POA: Diagnosis not present

## 2020-11-20 DIAGNOSIS — R634 Abnormal weight loss: Secondary | ICD-10-CM | POA: Diagnosis present

## 2020-11-20 DIAGNOSIS — N250 Renal osteodystrophy: Secondary | ICD-10-CM | POA: Diagnosis not present

## 2020-11-20 DIAGNOSIS — Z992 Dependence on renal dialysis: Secondary | ICD-10-CM

## 2020-11-20 DIAGNOSIS — I120 Hypertensive chronic kidney disease with stage 5 chronic kidney disease or end stage renal disease: Principal | ICD-10-CM | POA: Diagnosis present

## 2020-11-20 DIAGNOSIS — J90 Pleural effusion, not elsewhere classified: Secondary | ICD-10-CM | POA: Diagnosis not present

## 2020-11-20 DIAGNOSIS — G9389 Other specified disorders of brain: Secondary | ICD-10-CM | POA: Diagnosis not present

## 2020-11-20 DIAGNOSIS — N2581 Secondary hyperparathyroidism of renal origin: Secondary | ICD-10-CM | POA: Diagnosis present

## 2020-11-20 DIAGNOSIS — Z905 Acquired absence of kidney: Secondary | ICD-10-CM

## 2020-11-20 DIAGNOSIS — J449 Chronic obstructive pulmonary disease, unspecified: Secondary | ICD-10-CM | POA: Diagnosis present

## 2020-11-20 DIAGNOSIS — Z20822 Contact with and (suspected) exposure to covid-19: Secondary | ICD-10-CM | POA: Diagnosis present

## 2020-11-20 DIAGNOSIS — C801 Malignant (primary) neoplasm, unspecified: Secondary | ICD-10-CM | POA: Diagnosis not present

## 2020-11-20 DIAGNOSIS — C7802 Secondary malignant neoplasm of left lung: Secondary | ICD-10-CM | POA: Diagnosis not present

## 2020-11-20 DIAGNOSIS — Z79899 Other long term (current) drug therapy: Secondary | ICD-10-CM

## 2020-11-20 DIAGNOSIS — Z87891 Personal history of nicotine dependence: Secondary | ICD-10-CM | POA: Diagnosis not present

## 2020-11-20 DIAGNOSIS — D696 Thrombocytopenia, unspecified: Secondary | ICD-10-CM | POA: Diagnosis not present

## 2020-11-20 HISTORY — DX: Malignant neoplasm of unspecified part of unspecified bronchus or lung: C34.90

## 2020-11-20 LAB — CBC
HCT: 33.1 % — ABNORMAL LOW (ref 36.0–46.0)
Hemoglobin: 10.6 g/dL — ABNORMAL LOW (ref 12.0–15.0)
MCH: 28.5 pg (ref 26.0–34.0)
MCHC: 32 g/dL (ref 30.0–36.0)
MCV: 89 fL (ref 80.0–100.0)
Platelets: 448 10*3/uL — ABNORMAL HIGH (ref 150–400)
RBC: 3.72 MIL/uL — ABNORMAL LOW (ref 3.87–5.11)
RDW: 16 % — ABNORMAL HIGH (ref 11.5–15.5)
WBC: 34.8 10*3/uL — ABNORMAL HIGH (ref 4.0–10.5)
nRBC: 0 % (ref 0.0–0.2)

## 2020-11-20 LAB — COMPREHENSIVE METABOLIC PANEL
ALT: 16 U/L (ref 0–44)
AST: 34 U/L (ref 15–41)
Albumin: 2.2 g/dL — ABNORMAL LOW (ref 3.5–5.0)
Alkaline Phosphatase: 175 U/L — ABNORMAL HIGH (ref 38–126)
Anion gap: 18 — ABNORMAL HIGH (ref 5–15)
BUN: 63 mg/dL — ABNORMAL HIGH (ref 8–23)
CO2: 25 mmol/L (ref 22–32)
Calcium: 9.8 mg/dL (ref 8.9–10.3)
Chloride: 91 mmol/L — ABNORMAL LOW (ref 98–111)
Creatinine, Ser: 10.96 mg/dL — ABNORMAL HIGH (ref 0.44–1.00)
GFR, Estimated: 3 mL/min — ABNORMAL LOW (ref >60)
Glucose, Bld: 83 mg/dL (ref 70–99)
Potassium: 6.8 mmol/L (ref 3.5–5.1)
Sodium: 134 mmol/L — ABNORMAL LOW (ref 135–145)
Total Bilirubin: 0.9 mg/dL (ref 0.3–1.2)
Total Protein: 6.5 g/dL (ref 6.5–8.1)

## 2020-11-20 LAB — RESP PANEL BY RT-PCR (FLU A&B, COVID) ARPGX2
Influenza A by PCR: NEGATIVE
Influenza B by PCR: NEGATIVE
SARS Coronavirus 2 by RT PCR: NEGATIVE

## 2020-11-20 LAB — GLUCOSE, CAPILLARY: Glucose-Capillary: 141 mg/dL — ABNORMAL HIGH (ref 70–99)

## 2020-11-20 LAB — TROPONIN I (HIGH SENSITIVITY): Troponin I (High Sensitivity): 49 ng/L — ABNORMAL HIGH (ref <18)

## 2020-11-20 MED ORDER — CAMPHOR-MENTHOL 0.5-0.5 % EX LOTN
1.0000 "application " | TOPICAL_LOTION | Freq: Three times a day (TID) | CUTANEOUS | Status: DC | PRN
  Filled 2020-11-20: qty 222

## 2020-11-20 MED ORDER — DEXTROSE 50 % IV SOLN
1.0000 | Freq: Once | INTRAVENOUS | Status: AC
  Administered 2020-11-20: 50 mL via INTRAVENOUS
  Filled 2020-11-20: qty 50

## 2020-11-20 MED ORDER — HYDROXYZINE HCL 25 MG PO TABS
25.0000 mg | ORAL_TABLET | Freq: Three times a day (TID) | ORAL | Status: DC | PRN
  Administered 2020-11-27: 25 mg via ORAL
  Filled 2020-11-20: qty 1

## 2020-11-20 MED ORDER — METHOCARBAMOL 1000 MG/10ML IJ SOLN
500.0000 mg | Freq: Four times a day (QID) | INTRAVENOUS | Status: DC | PRN
  Filled 2020-11-20 (×4): qty 5

## 2020-11-20 MED ORDER — DOCUSATE SODIUM 100 MG PO CAPS
100.0000 mg | ORAL_CAPSULE | Freq: Two times a day (BID) | ORAL | Status: DC
  Administered 2020-11-21: 100 mg via ORAL
  Filled 2020-11-20 (×2): qty 1

## 2020-11-20 MED ORDER — GADOBUTROL 1 MMOL/ML IV SOLN
5.0000 mL | Freq: Once | INTRAVENOUS | Status: AC | PRN
  Administered 2020-11-20: 5 mL via INTRAVENOUS

## 2020-11-20 MED ORDER — ONDANSETRON HCL 4 MG/2ML IJ SOLN: 4.0000 mg | Freq: Four times a day (QID) | INTRAMUSCULAR | Status: DC | PRN

## 2020-11-20 MED ORDER — POLYETHYLENE GLYCOL 3350 17 G PO PACK: 17.0000 g | PACK | Freq: Every day | ORAL | Status: DC | PRN

## 2020-11-20 MED ORDER — ZOLPIDEM TARTRATE 5 MG PO TABS: 5.0000 mg | ORAL_TABLET | Freq: Every evening | ORAL | Status: DC | PRN

## 2020-11-20 MED ORDER — NEPRO/CARBSTEADY PO LIQD: 237.0000 mL | Freq: Three times a day (TID) | ORAL | Status: DC | PRN

## 2020-11-20 MED ORDER — HEPARIN SODIUM (PORCINE) 1000 UNIT/ML IJ SOLN: 1000.0000 [IU] | INTRAMUSCULAR | Status: DC | PRN

## 2020-11-20 MED ORDER — SODIUM CHLORIDE 0.9 % IV SOLN
50.0000 mg | INTRAVENOUS | Status: DC
  Administered 2020-11-27: 50 mg via INTRAVENOUS
  Filled 2020-11-20: qty 2.5

## 2020-11-20 MED ORDER — ROSUVASTATIN CALCIUM 5 MG PO TABS
10.0000 mg | ORAL_TABLET | Freq: Every day | ORAL | Status: DC
  Administered 2020-11-21 – 2020-11-27 (×6): 10 mg via ORAL
  Filled 2020-11-20 (×7): qty 2

## 2020-11-20 MED ORDER — MORPHINE SULFATE (PF) 2 MG/ML IV SOLN: 0.5000 mg | INTRAVENOUS | Status: DC | PRN

## 2020-11-20 MED ORDER — FENTANYL CITRATE (PF) 100 MCG/2ML IJ SOLN: 25.0000 ug | INTRAMUSCULAR | Status: DC | PRN

## 2020-11-20 MED ORDER — SORBITOL 70 % SOLN: 30.0000 mL | Status: DC | PRN

## 2020-11-20 MED ORDER — ONDANSETRON HCL 4 MG PO TABS: 4.0000 mg | ORAL_TABLET | Freq: Four times a day (QID) | ORAL | Status: DC | PRN

## 2020-11-20 MED ORDER — INSULIN ASPART 100 UNIT/ML ~~LOC~~ SOLN: 0.0000 [IU] | Freq: Three times a day (TID) | SUBCUTANEOUS | Status: DC

## 2020-11-20 MED ORDER — SODIUM ZIRCONIUM CYCLOSILICATE 10 G PO PACK
10.0000 g | PACK | ORAL | Status: AC
  Administered 2020-11-20: 10 g via ORAL
  Filled 2020-11-20: qty 1

## 2020-11-20 MED ORDER — HEPARIN SODIUM (PORCINE) 1000 UNIT/ML IJ SOLN
INTRAMUSCULAR | Status: AC
  Administered 2020-11-21: 3200 [IU] via INTRAVENOUS
  Filled 2020-11-20: qty 4

## 2020-11-20 MED ORDER — AMLODIPINE BESYLATE 10 MG PO TABS
10.0000 mg | ORAL_TABLET | Freq: Every day | ORAL | Status: DC
  Administered 2020-11-21 – 2020-11-24 (×3): 10 mg via ORAL
  Filled 2020-11-20 (×6): qty 1

## 2020-11-20 MED ORDER — CALCIUM CARBONATE ANTACID 1250 MG/5ML PO SUSP
500.0000 mg | Freq: Four times a day (QID) | ORAL | Status: DC | PRN
  Filled 2020-11-20: qty 5

## 2020-11-20 MED ORDER — DOCUSATE SODIUM 283 MG RE ENEM
1.0000 | ENEMA | RECTAL | Status: DC | PRN
  Filled 2020-11-20: qty 1

## 2020-11-20 MED ORDER — CALCIUM GLUCONATE-NACL 1-0.675 GM/50ML-% IV SOLN
1.0000 g | Freq: Once | INTRAVENOUS | Status: AC
  Administered 2020-11-20: 1000 mg via INTRAVENOUS
  Filled 2020-11-20: qty 50

## 2020-11-20 MED ORDER — HYDROCODONE-ACETAMINOPHEN 5-325 MG PO TABS
1.0000 | ORAL_TABLET | Freq: Four times a day (QID) | ORAL | Status: DC | PRN
  Administered 2020-11-20: 1 via ORAL
  Filled 2020-11-20: qty 1

## 2020-11-20 MED ORDER — SODIUM BICARBONATE 8.4 % IV SOLN
50.0000 meq | Freq: Once | INTRAVENOUS | Status: AC
  Administered 2020-11-20: 50 meq via INTRAVENOUS
  Filled 2020-11-20: qty 50

## 2020-11-20 MED ORDER — INSULIN ASPART 100 UNIT/ML IV SOLN
5.0000 [IU] | Freq: Once | INTRAVENOUS | Status: AC
  Administered 2020-11-20: 5 [IU] via INTRAVENOUS

## 2020-11-20 MED ORDER — BISACODYL 5 MG PO TBEC: 5.0000 mg | DELAYED_RELEASE_TABLET | Freq: Every day | ORAL | Status: DC | PRN

## 2020-11-20 MED ORDER — METHOCARBAMOL 500 MG PO TABS: 500.0000 mg | ORAL_TABLET | Freq: Four times a day (QID) | ORAL | Status: DC | PRN

## 2020-11-20 MED ORDER — IRBESARTAN 75 MG PO TABS
150.0000 mg | ORAL_TABLET | Freq: Every day | ORAL | Status: DC
  Administered 2020-11-21 – 2020-11-26 (×5): 150 mg via ORAL
  Filled 2020-11-20 (×6): qty 2

## 2020-11-20 MED ORDER — CARVEDILOL 12.5 MG PO TABS
12.5000 mg | ORAL_TABLET | Freq: Two times a day (BID) | ORAL | Status: DC
  Administered 2020-11-21 – 2020-11-28 (×12): 12.5 mg via ORAL
  Filled 2020-11-20 (×14): qty 1

## 2020-11-20 MED ORDER — ACETAMINOPHEN 325 MG PO TABS: 650.0000 mg | ORAL_TABLET | Freq: Four times a day (QID) | ORAL | Status: DC | PRN

## 2020-11-20 MED ORDER — ACETAMINOPHEN 650 MG RE SUPP: 650.0000 mg | Freq: Four times a day (QID) | RECTAL | Status: DC | PRN

## 2020-11-20 MED ORDER — SODIUM ZIRCONIUM CYCLOSILICATE 5 G PO PACK
5.0000 g | PACK | Freq: Once | ORAL | Status: DC
  Filled 2020-11-20: qty 1

## 2020-11-20 NOTE — H&P (Signed)
History and Physical    Cassandra Dyer WIO:[id number] DOB: 1941-12-03 DOA: 11/20/2020  PCP: Biagio Borg, MD Consultants:  Lisbeth Renshaw - rad onc; [facility] - oncology; [place] - pulmonology; [facility] - vascular surgery; [facility] - urology; nephrology Patient coming from:  Home - lives with friend; NOK: Eben Burow, [phone]  Chief Complaint: weakness, missed HD  HPI: Cassandra Dyer is a 79 y.o. female with medical history significant of HTN; HLD; recent diagnosis of lung cancer; remote h/o renal cell carcinoma; COPD on 2L home O2; and ESRD on HD presenting with weakness, missed HD.  She reports that her leg was a problem and she was nervous.  She was fine on Sunday, walked to the bathroom.  She has had R hip pain then and hasn't really been able to walk since.  She is still hurting in her R groin.  She hasn't been able to go to HD.  She has had poor appetite for some time.  She has lost weight, maybe 25 pounds.  She was previously hospitalized from 11/8-24 after missed HD x 1.  She presented with acute on chronic hypoxic respiratory failure and underwent thoracentesis x 3.  Echo was performed with grade 2 diastolic dysfunction.  She was found to have a L hilar mass and bronchoscopy with endobronchial [place] was performed.  She was subsequently diagnosed with stage IV (T3, N2, M1a) NSCLC.  MRI brain and PET scan were ordered.   She was seen by Rad Onc on 12/2 with the plan for palliative radiation.  She was seen again by the oncology PA on 12/6 with c/o SOB and R hip pain.  CXR showed stable pleural effusions and R hip film with minimally displaced R femoral neck fracture concerning for pathologic fracture.  She was told to stay off the hip and has been in a wheelchair since.  Due to the pain and her discomfort, she missed HD on 12/6 and today.   ED Course:  Hyperkalemia.  Dr. Carolynne Edouard will see and dialyze.  Has h/o RCC s/p nephrectomy.  Also with more recent diagnosis of lung cancer, ?has not started chemo.   Hasn't felt well a few days - malaise, anorexia.  Skipped HD Mon-Wed.  R femoral neck fracture - ?pathologic, plan for PET scan.  Review of Systems: As per HPI; otherwise review of systems reviewed and negative.   Ambulatory Status:  Ambulate without assistance  COVID Vaccine Status:   Complete plus booster  Past Medical History:  Diagnosis Date  . Anemia   . ESRD on hemodialysis ([place])   . Hepatitis    hx of hep C - 10-20 years ago   . History of blood transfusion   . History of kidney cancer   . Hyperlipidemia   . Hypertension    not taken b/p med in 2-3 years md deceased never went to another md   . Renal cell carcinoma ([place]) 2018  . Stage 4 lung cancer ([place])    new - due to start therapy with next appt on 12/14    Past Surgical History:  Procedure Laterality Date  . AV FISTULA PLACEMENT Left 08/07/2020   Procedure: LEFT ARM ARTERIOVENOUS (AV) FISTULA CREATION;  Surgeon: [name], MD;  Location: [place];  Service: Vascular;  Laterality: Left;  . BRONCHIAL BIOPSY  11/05/2020   Procedure: BRONCHIAL BIOPSIES;  Surgeon: Collene Gobble, MD;  Location: [facility] ENDOSCOPY;  Service: Cardiopulmonary;;  . BRONCHIAL BRUSHINGS  11/05/2020   Procedure: BRONCHIAL BRUSHINGS;  Surgeon: Collene Gobble, MD;  Location: [place];  Service: Cardiopulmonary;;  . BRONCHIAL NEEDLE ASPIRATION BIOPSY  11/05/2020   Procedure: BRONCHIAL NEEDLE ASPIRATION BIOPSIES;  Surgeon: Collene Gobble, MD;  Location: [place];  Service: Cardiopulmonary;;  . CATARACT EXTRACTION Bilateral    11/30 left /12/9 right  . ELECTROMAGNETIC NAVIGATION BROCHOSCOPY N/A 11/05/2020   Procedure: ELECTROMAGNETIC NAVIGATION BRONCHOSCOPY;  Surgeon: Collene Gobble, MD;  Location: [place];  Service: Cardiopulmonary;  Laterality: N/A;  . ENDOBRONCHIAL ULTRASOUND N/A 11/05/2020   Procedure: ENDOBRONCHIAL ULTRASOUND;  Surgeon: Collene Gobble, MD;  Location: [facility] ENDOSCOPY;  Service: Cardiopulmonary;  Laterality: N/A;  . FINE  NEEDLE ASPIRATION  11/05/2020   Procedure: FINE NEEDLE ASPIRATION (FNA) LINEAR;  Surgeon: Collene Gobble, MD;  Location: [place] ENDOSCOPY;  Service: Cardiopulmonary;;  . IR FLUORO GUIDE CV LINE RIGHT  07/27/2020  . IR THORACENTESIS ASP PLEURAL SPACE W/IMG GUIDE  10/24/2020  . IR THORACENTESIS ASP PLEURAL SPACE W/IMG GUIDE  10/25/2020  . IR THORACENTESIS ASP PLEURAL SPACE W/IMG GUIDE  10/31/2020  . IR US GUIDE VASC ACCESS RIGHT  07/27/2020  . ROBOT ASSISTED LAPAROSCOPIC NEPHRECTOMY Right 01/13/2017   Procedure: XI ROBOTIC ASSISTED LAPAROSCOPIC RADICAL NEPHRECTOMY WITH LYSIS OF ADHESION;  Surgeon: Alexis Frock, MD;  Location: [facility];  Service: Urology;  Laterality: Right;  Marland Kitchen VIDEO BRONCHOSCOPY N/A 11/05/2020   Procedure: VIDEO BRONCHOSCOPY WITH FLUORO;  Surgeon: Collene Gobble, MD;  Location: [facility] ENDOSCOPY;  Service: Cardiopulmonary;  Laterality: N/A;    Social History   Socioeconomic History  . Marital status: Significant Other    Spouse name: Not on file  . Number of children: 0  . Years of education: 9  . Highest education level: Not on file  Occupational History  . Occupation: Retired  Tobacco Use  . Smoking status: Former Smoker    Packs/day: 0.50    Years: 30.00    Pack years: 15.00    Types: Cigarettes    Quit date: 12/14/2000    Years since quitting: 19.9  . Smokeless tobacco: Never Used  Vaping Use  . Vaping Use: Never used  Substance and Sexual Activity  . Alcohol use: Yes    Comment: occasional wine   . Drug use: No  . Sexual activity: Not Currently  Other Topics Concern  . Not on file  Social History Narrative   Fun/Hobby: Watching TV and crossword puzzles   Social Determinants of Health   Financial Resource Strain:   . Difficulty of Paying Living Expenses: Not on file  Food Insecurity: No Food Insecurity  . Worried About Charity fundraiser in the Last Year: Never true  . Ran Out of Food in the Last Year: Never true  Transportation Needs: No Transportation  Needs  . Lack of Transportation (Medical): No  . Lack of Transportation (Non-Medical): No  Physical Activity:   . Days of Exercise per Week: Not on file  . Minutes of Exercise per Session: Not on file  Stress:   . Feeling of Stress : Not on file  Social Connections:   . Frequency of Communication with Friends and Family: Not on file  . Frequency of Social Gatherings with Friends and Family: Not on file  . Attends Religious Services: Not on file  . Active Member of Clubs or Organizations: Not on file  . Attends Archivist Meetings: Not on file  . Marital Status: Not on file  Intimate Partner Violence:   . Fear of Current or Ex-Partner:  Not on file  . Emotionally Abused: Not on file  . Physically Abused: Not on file  . Sexually Abused: Not on file    No Known Allergies  Family History  Problem Relation Age of Onset  . Hypertension Mother   . Pancreatic cancer Father   . Breast cancer Sister     Prior to Admission medications   Medication Sig Start Date End Date Taking? Authorizing Provider  amLODipine (NORVASC) 10 MG tablet Take 1 tablet (10 mg total) by mouth daily. 04/19/20   Biagio Borg, MD  amoxicillin-clavulanate (AUGMENTIN) 875-125 MG tablet Take 1 tablet by mouth 2 (two) times daily. 11/18/20   Tanner, Lyndon Code., PA-C  ascorbic acid (VITAMIN C) 500 MG tablet Take 500 mg by mouth 2 (two) times daily.  08/31/20   [provider]  Black Currant Seed Oil 500 MG CAPS Take 500 mg by mouth every morning.     [provider]  Calcium Carbonate-Vit D-Min (CALCIUM 1200 PO) Take 1,200 mg by mouth every morning.     [provider]  carvedilol (COREG) 12.5 MG tablet Take 1 tablet (12.5 mg total) by mouth 2 (two) times daily with a meal. 08/08/20   Georgette Shell, MD  Cholecalciferol (VITAMIN D3) 50 MCG (2000 UT) capsule Take 2,000 Units by mouth every morning. 08/31/20   [provider]  Garlic 5284 MG CAPS Take 2,000 mg by mouth daily.      [provider]  metoprolol succinate (TOPROL-XL) 100 MG 24 hr tablet Take 100 mg by mouth daily. 10/15/20   [provider]  Multiple Vitamin (MULTIVITAMIN WITH MINERALS) TABS tablet Take 1 tablet by mouth every morning.     [provider]  Omega-3 Fatty Acids (FISH OIL PO) Take 1 capsule by mouth every morning.     [provider]  ondansetron (ZOFRAN) 4 MG tablet Take 1 tablet (4 mg total) by mouth every 8 (eight) hours as needed for nausea or vomiting. 04/19/20   Biagio Borg, MD  OVER THE COUNTER MEDICATION Take 1 capsule by mouth daily. Mega juice    [provider]  polyethylene glycol (MIRALAX / GLYCOLAX) 17 g packet Take 17 g by mouth daily as needed for mild constipation. 09/12/20   Domenic Polite, MD  rosuvastatin (CRESTOR) 10 MG tablet Take 1 tablet (10 mg total) by mouth daily. 09/26/20   Biagio Borg, MD  senna-docusate (SENOKOT-S) 8.6-50 MG tablet Take 1 tablet by mouth daily as needed for mild constipation. Patient not taking: Reported on 10/21/2020 09/12/20   Domenic Polite, MD  telmisartan (MICARDIS) 80 MG tablet Take 80 mg by mouth daily. 10/15/20   [provider]    Physical Exam: Vitals:   11/20/20 1224 11/20/20 1357 11/20/20 1434  BP: (!) 141/111 (!) 171/85 (!) 183/71  Pulse: (!) 112 (!) 108 (!) 102  Resp: 18 16 14   Temp: 98.3 F (36.8 C)  98.7 F (37.1 C)  TempSrc: Oral  Oral  SpO2: 100% 100% 97%     . General:  Appears calm and comfortable and in NAD while at rest . Eyes:  PERRL, EOMI, normal lids, iris . ENT:  grossly normal hearing, lips & tongue, mmm . Neck:  no LAD, masses or thyromegaly . Cardiovascular:  RR with mild tachycardia, no m/r/g. No LE edema.  Marland Kitchen Respiratory:   CTA bilaterally with no wheezes/rales/rhonchi.  Normal respiratory effort. . Abdomen:  soft, NT, ND, NABS . Skin:  no rash  or induration seen on limited exam . Musculoskeletal:  RLE is slightly shortened and internally  rotated . Lower extremity:  No LE edema.  Limited foot exam with no ulcerations.  2+ distal pulses. Marland Kitchen Psychiatric:  grossly normal mood and affect, speech fluent and appropriate, AOx3 . Neurologic:  CN 2-12 grossly intact, moves all extremities in coordinated fashion    Radiological Exams on Admission: Independently reviewed - see discussion in A/P where applicable  DG Chest 2 View  Result Date: 11/20/2020 CLINICAL DATA:  cp EXAM: CHEST - 2 VIEW COMPARISON:  11/18/2020 FINDINGS: Tunneled right IJ CVC tip overlies the superior cavoatrial junction. Cardiomegaly with central pulmonary vascular congestion. Redemonstration of small bilateral pleural effusions. New patchy bilateral pulmonary opacities. Multilevel spondylosis. IMPRESSION: 1. Cardiomegaly with central pulmonary vascular congestion. Redemonstration of small bilateral pleural effusions. 2. New confluent/patchy bilateral pulmonary opacities. Differential includes edema versus infection. Electronically Signed   By: Primitivo Gauze M.D.   On: 11/20/2020 13:29    EKG: Independently reviewed.  Sinus tachycardia with rate 110; peaked T waves with no evidence of acute ischemia   Labs on Admission: I have personally reviewed the available labs and imaging studies at the time of the admission.  Pertinent labs:   K+ 6.8 BUN 63/Creatinine 10.9/GFR 3 Anion gap 18 AP 175 Albumin 2.2 HS troponin 49 WBC 34.8 - stable Hgb 10.6 - stable Platelets 448   Assessment/Plan Principal Problem:   Noncompliance of patient with renal dialysis ([place]) Active Problems:   Essential hypertension   Dyslipidemia   Hyperkalemia, diminished renal excretion   COPD (chronic obstructive pulmonary disease) (HCC)   Adenocarcinoma of left lung, stage 4 (HCC)   Acute right hip pain   Noncompliance with HD -Patient on chronic MWWF HD -Nephrology prn order set utilized -She missed HD x 2 and is presenting with significant hyperkalemia; this is her second  recent admission for HD non-compliance -In this case, it appears that she missed HD due to acute R hip pain that is concerning for a pathologic fracture (see below) -Nephrology is consulting and has ordered HD  Hyperkalemia -Due to ESRD -Anticipate resolution after HD -Treated in the ER with calcium gluconate; insulin/glucose; Lokelma; bicarb -Nephrology consult is pending  Acute R hip pain -She was walking unassisted as of 12/5 when she developed acute pain in her R hip which has been persistent since (describes the pain in the R groin) -Imaging on 12/6 appears to indicate a pathological femoral neck fracture -Will obtain MRI for further evaluation -Patient d/w Hilbert Odor from orthopedics and he will see the patient tomorrow; she is scheduled to see Dr. Ninfa Linden about this soon  Stage 4 lung cancer -Recent diagnosis, preparing to start palliative radiation therapy -She is scheduled for MRI brain, will order now since this may impact clinical course and could be contributing to weakness (although more likely associated with hip issue and missed HD)  HTN -Continue Coreg, Micardis -HTN control is likely to improve post-HD -Hold Toprol XL - should not be taking with Coreg  HLD -Continue Crestor  COPD -On 2L [place] O2 -Has pleural effusions -Appears stable from this standpoint currently    Note: This patient has been tested and is pending for the novel coronavirus COVID-19. The patient has been fully vaccinated against COVID-19.    DVT prophylaxis: SCDs Code Status:  DNR - confirmed with patient Family Communication: None present Disposition Plan:  The patient is from: home  Anticipated d/c is to: be determined  Anticipated  d/c date will depend on clinical response to treatment, but likely several days  Patient is currently: acutely ill Consults called: Nephrology; orthopedics; nutrition Admission status:  Admit - It is my clinical opinion that admission to INPATIENT is  reasonable and necessary because of the expectation that this patient will require hospital care that crosses at least 2 midnights to treat this condition based on the medical complexity of the problems presented.  Given the aforementioned information, the predictability of an adverse outcome is felt to be significant.    Karmen Bongo MD [facility]   How to contact the [facility] Attending or Consulting provider [place] or covering provider during after hours [place], for this patient?  1. Check the care team in [facility] and look for a) attending/consulting [facility] provider listed and b) the [facility] team listed 2. Log into [email or web address] and use [place]'s universal password to access. If you do not have the password, please contact the hospital operator. 3. Locate the [facility] provider you are looking for under [facility] and page to a number that you can be directly reached. 4. If you still have difficulty reaching the provider, please page the [facility] (Director on Call) for the Hospitalists listed on amion for assistance.   11/20/2020, 4:39 PM

## 2020-11-20 NOTE — ED Notes (Signed)
Per Lab potassium 6.8

## 2020-11-20 NOTE — Telephone Encounter (Signed)
Scheduled per los. Called and left msg. Mailed printout  °

## 2020-11-20 NOTE — ED Triage Notes (Signed)
Pt here from home with c/o not feeling well , arm and leg shaking , pt has missed her last two dialysis appointments

## 2020-11-20 NOTE — Consult Note (Signed)
[facility] Renal Consultation Note  Indication for Consultation:  Management of ESRD/hemodialysis; anemia, hypertension/volume and secondary hyperparathyroidism  HPI: Cassandra Dyer is a 79 y.o. female with ESRD on hemodialysis [place] MWF, history of renal cell carcinoma nephrectomy 2018 newly diagnosed lung cancer stage IV now admitted with hyperkalemia  (K6.1 )and weakness.  Said she missed treatment on 12/06 Monday secondary to right leg weakness right groin pain.  Noted oncology PA for Dr. Earlie Server seen and x-ray revealed right femoral neck fracture with x-ray 11/18/2020.  Patient reports malaise decreased appetite, denies fever chills shortness of breath chest pain nausea vomiting or diarrhea.  We are consulted for hemodialysis/ESRD needs.      Past Medical History:  Diagnosis Date  . Anemia   . ESRD on hemodialysis ([place])   . Hepatitis    hx of hep C - 10-20 years ago   . History of blood transfusion   . History of kidney cancer   . Hyperlipidemia   . Hypertension    not taken b/p med in 2-3 years md deceased never went to another md     Past Surgical History:  Procedure Laterality Date  . AV FISTULA PLACEMENT Left 08/07/2020   Procedure: LEFT ARM ARTERIOVENOUS (AV) FISTULA CREATION;  Surgeon: [name], MD;  Location: [facility];  Service: Vascular;  Laterality: Left;  . BRONCHIAL BIOPSY  11/05/2020   Procedure: BRONCHIAL BIOPSIES;  Surgeon: Collene Gobble, MD;  Location: [facility] ENDOSCOPY;  Service: Cardiopulmonary;;  . BRONCHIAL BRUSHINGS  11/05/2020   Procedure: BRONCHIAL BRUSHINGS;  Surgeon: Collene Gobble, MD;  Location: [place];  Service: Cardiopulmonary;;  . BRONCHIAL NEEDLE ASPIRATION BIOPSY  11/05/2020   Procedure: BRONCHIAL NEEDLE ASPIRATION BIOPSIES;  Surgeon: Collene Gobble, MD;  Location: [place];  Service: Cardiopulmonary;;  . CATARACT EXTRACTION Bilateral    11/30 left /12/9 right  . ELECTROMAGNETIC NAVIGATION BROCHOSCOPY N/A  11/05/2020   Procedure: ELECTROMAGNETIC NAVIGATION BRONCHOSCOPY;  Surgeon: Collene Gobble, MD;  Location: [place];  Service: Cardiopulmonary;  Laterality: N/A;  . ENDOBRONCHIAL ULTRASOUND N/A 11/05/2020   Procedure: ENDOBRONCHIAL ULTRASOUND;  Surgeon: Collene Gobble, MD;  Location: [facility] ENDOSCOPY;  Service: Cardiopulmonary;  Laterality: N/A;  . FINE NEEDLE ASPIRATION  11/05/2020   Procedure: FINE NEEDLE ASPIRATION (FNA) LINEAR;  Surgeon: Collene Gobble, MD;  Location: [place] ENDOSCOPY;  Service: Cardiopulmonary;;  . IR FLUORO GUIDE CV LINE RIGHT  07/27/2020  . IR THORACENTESIS ASP PLEURAL SPACE W/IMG GUIDE  10/24/2020  . IR THORACENTESIS ASP PLEURAL SPACE W/IMG GUIDE  10/25/2020  . IR THORACENTESIS ASP PLEURAL SPACE W/IMG GUIDE  10/31/2020  . IR US GUIDE VASC ACCESS RIGHT  07/27/2020  . ROBOT ASSISTED LAPAROSCOPIC NEPHRECTOMY Right 01/13/2017   Procedure: XI ROBOTIC ASSISTED LAPAROSCOPIC RADICAL NEPHRECTOMY WITH LYSIS OF ADHESION;  Surgeon: Alexis Frock, MD;  Location: [facility];  Service: Urology;  Laterality: Right;  Marland Kitchen VIDEO BRONCHOSCOPY N/A 11/05/2020   Procedure: VIDEO BRONCHOSCOPY WITH FLUORO;  Surgeon: Collene Gobble, MD;  Location: [facility] ENDOSCOPY;  Service: Cardiopulmonary;  Laterality: N/A;      Family History  Problem Relation Age of Onset  . Hypertension Mother   . Pancreatic cancer Father   . Breast cancer Sister       reports that she quit smoking about 19 years ago. Her smoking use included cigarettes. She has a 15.00 pack-year smoking history. She has never used smokeless tobacco. She reports current alcohol use. She reports that she does not use drugs.  No Known Allergies  Prior to Admission medications   Medication Sig Start Date End Date Taking? Authorizing Provider  amLODipine (NORVASC) 10 MG tablet Take 1 tablet (10 mg total) by mouth daily. 04/19/20   Biagio Borg, MD  amoxicillin-clavulanate (AUGMENTIN) 875-125 MG tablet Take 1 tablet by mouth 2 (two) times daily.  11/18/20   Tanner, Lyndon Code., PA-C  ascorbic acid (VITAMIN C) 500 MG tablet Take 500 mg by mouth 2 (two) times daily.  08/31/20   [provider]  Black Currant Seed Oil 500 MG CAPS Take 500 mg by mouth every morning.     [provider]  Calcium Carbonate-Vit D-Min (CALCIUM 1200 PO) Take 1,200 mg by mouth every morning.     [provider]  carvedilol (COREG) 12.5 MG tablet Take 1 tablet (12.5 mg total) by mouth 2 (two) times daily with a meal. 08/08/20   Georgette Shell, MD  Cholecalciferol (VITAMIN D3) 50 MCG (2000 UT) capsule Take 2,000 Units by mouth every morning. 08/31/20   [provider]  Garlic 0938 MG CAPS Take 2,000 mg by mouth daily.     [provider]  metoprolol succinate (TOPROL-XL) 100 MG 24 hr tablet Take 100 mg by mouth daily. 10/15/20   [provider]  Multiple Vitamin (MULTIVITAMIN WITH MINERALS) TABS tablet Take 1 tablet by mouth every morning.     [provider]  Omega-3 Fatty Acids (FISH OIL PO) Take 1 capsule by mouth every morning.     [provider]  ondansetron (ZOFRAN) 4 MG tablet Take 1 tablet (4 mg total) by mouth every 8 (eight) hours as needed for nausea or vomiting. 04/19/20   Biagio Borg, MD  OVER THE COUNTER MEDICATION Take 1 capsule by mouth daily. Mega juice    [provider]  polyethylene glycol (MIRALAX / GLYCOLAX) 17 g packet Take 17 g by mouth daily as needed for mild constipation. 09/12/20   Domenic Polite, MD  rosuvastatin (CRESTOR) 10 MG tablet Take 1 tablet (10 mg total) by mouth daily. 09/26/20   Biagio Borg, MD  senna-docusate (SENOKOT-S) 8.6-50 MG tablet Take 1 tablet by mouth daily as needed for mild constipation. Patient not taking: Reported on 10/21/2020 09/12/20   Domenic Polite, MD  telmisartan (MICARDIS) 80 MG tablet Take 80 mg by mouth daily. 10/15/20   [provider]    PRN:  Results for orders placed or performed during the hospital encounter of  11/20/20 (from the past 48 hour(s))  CBC     Status: Abnormal   Collection Time: 11/20/20 12:40 PM  Result Value Ref Range   WBC 34.8 (H) 4.0 - 10.5 K/uL   RBC 3.72 (L) 3.87 - 5.11 MIL/uL   Hemoglobin 10.6 (L) 12.0 - 15.0 g/dL   HCT 33.1 (L) 36 - 46 %   MCV 89.0 80.0 - 100.0 fL   MCH 28.5 26.0 - 34.0 pg   MCHC 32.0 30.0 - 36.0 g/dL   RDW 16.0 (H) 11.5 - 15.5 %   Platelets 448 (H) 150 - 400 K/uL   nRBC 0.0 0.0 - 0.2 %    Comment: Performed at [facility], [place] [address], [place], [place] [address]  Troponin I (High Sensitivity)     Status: Abnormal   Collection Time: 11/20/20 12:40 PM  Result Value Ref Range   Troponin I (High Sensitivity) 49 (H) <18 ng/L    Comment: (NOTE) Elevated high sensitivity troponin I (hsTnI) values and  significant  changes across serial measurements may suggest ACS but many other  chronic and acute conditions are known to elevate hsTnI results.  Refer to the Links section for chest pain algorithms and additional  guidance. Performed at [facility], [place] [address] [place], [place] [address]   Comprehensive metabolic panel     Status: Abnormal   Collection Time: 11/20/20 12:40 PM  Result Value Ref Range   Sodium 134 (L) 135 - 145 mmol/L   Potassium 6.8 (HH) 3.5 - 5.1 mmol/L    Comment: SLIGHT HEMOLYSIS CRITICAL RESULT CALLED TO, READ BACK BY AND VERIFIED WITH: BISHOP,L RN @1400  ON [id number] BY FLEMINGS    Chloride 91 (L) 98 - 111 mmol/L   CO2 25 22 - 32 mmol/L   Glucose, Bld 83 70 - 99 mg/dL    Comment: Glucose reference range applies only to samples taken after fasting for at least 8 hours.   BUN 63 (H) 8 - 23 mg/dL   Creatinine, Ser 10.96 (H) 0.44 - 1.00 mg/dL   Calcium 9.8 8.9 - 10.3 mg/dL   Total Protein 6.5 6.5 - 8.1 g/dL   Albumin 2.2 (L) 3.5 - 5.0 g/dL   AST 34 15 - 41 U/L   ALT 16 0 - 44 U/L   Alkaline Phosphatase 175 (H) 38 - 126 U/L   Total Bilirubin 0.9 0.3 - 1.2 mg/dL   GFR, Estimated 3 (L) >60 mL/min    Comment:  (NOTE) Calculated using the CKD-EPI Creatinine Equation (2021)    Anion gap 18 (H) 5 - 15    Comment: Performed at [facility], [place] [address] [place], [place] [address]   .  ROS: see hpi  Physical Exam: Vitals:   11/20/20 1357 11/20/20 1434  BP: (!) 171/85 (!) 183/71  Pulse: (!) 108 (!) 102  Resp: 16 14  Temp:  98.7 F (37.1 C)  SpO2: 100% 97%     General: Thin frail chronically ill female alert NAD HEENT: Interior, PERRLA, nonicteric, MMM moist  Neck: Supple, no JVD appreciated Heart: Tacky regular, no rub murmur or gallop appreciated Lungs: Slightly decreased breath sounds bases, nonlabored breathing Abdomen: Bowel sounds normoactive, soft nontender nondistended, no ascites Extremities: No pedal edema Skin: No overt rash warm and dry Neuro: Alert oriented x3 no asterixis, decreased range of motion right lower extremity secondary to right groin pain otherwise no acute focal deficits appreciated Dialysis Access: Positive bruit left basilic vein AV fistula, IJ PermCath  Dialysis Orders: Center: [place] on MWF, EDW 48.5 kg, [place] 2.0 potassium 2.0 calcium, 2000 units heparin.  Venofer 50 mg,  no ESA secondary to cancer,  Access =left BC aVF placed 08/07/2020, right IJ PermCath   Assessment/Plan 1. Hyperkalemia secondary to missed dialysis, also some diet indiscretion eating a lot of mashed potatoes, will treat hyperkalemia medically unfortunately unable to get to dialysis secondary to scheduling other acute cases.  2. ESRD -hemodialysis normal MWF we will write for dialysis tomorrow (no HD tonight secondary to scheduling volume and more acute cases) and back on schedule as able 3. Hypertension/volume  -mild hypotension in ER may be pain, volume okay, continue home BP meds 4. Anemia  -Hgb 10.6, continue weekly iron, not on ESA secondary to cancer 5. Metabolic bone disease -not on vitamin D on dialysis /home meds list 2 po vitamin D supplement continue vitamin D3 can DC the  other calcium carbonate with vitamin D. follow-up calcium and phosphorus labs no current phosphate binder  listed 6. Right femoral neck fracture= plan per admit team 7. Recent diagnosed lung cancer= followed by Dr. Earlie Server oncology per patient has appointment December 14 to discuss treatment plan  Ernest Haber, PA-C [place] [phone] 11/20/2020, 3:14 PM

## 2020-11-20 NOTE — Progress Notes (Signed)
NEW ADMISSION NOTE  Arrival Method: bed Mental Orientation:Alert and oriented x4 Telemetry: No Assessment: Completed Skin: see flowsheets Iv: right hand Pain: 9 Tubes: 0 Safety Measures: Safety Fall Prevention Plan has been given, discussed and signed Admission: Completed [facility] Orientation: Patient has been orientated to the room, unit and staff.  Family: 0  Orders have been reviewed and implemented. Will continue to monitor the patient. Call light has been placed within reach and bed alarm has been activated.    Beatris Ship, RN

## 2020-11-20 NOTE — ED Provider Notes (Signed)
[facility] EMERGENCY DEPARTMENT Provider Note   CSN: [id number] Arrival date & time: 11/20/20  1058     History Chief Complaint  Patient presents with  . Weakness  . Shaking    Cassandra Dyer is a 79 y.o. female.  HPI   Patient presents to the ED with complaints of increasing weakness and malaise.  Patient states she has not had a good appetite.  She also has had episodes of diarrhea.  She feels nauseated but has not vomited.  Patient states the symptoms started over the last several days and have progressed.  Patient has chronic kidney disease and is supposed to be on dialysis.  Patient last went to dialysis on Friday.  She was not feeling well on Monday so she did not go.  She was still not feeling well today.  Patient also has history of adenocarcinoma of the left lung.  Patient also has been having right hip pain.  Patient states she has seen her doctor and they feel it could be related to the cancer.  Medical records indicate that the patient was seen in the office on December 6.  She had x-rays to evaluate for pleural effusions.  There is also mention of a minimally displaced right femoral neck fracture suspicious for pathologic fracture.  Past Medical History:  Diagnosis Date  . Anemia   . ESRD on hemodialysis ([place])   . Hepatitis    hx of hep C - 10-20 years ago   . History of blood transfusion   . History of kidney cancer   . Hyperlipidemia   . Hypertension    not taken b/p med in 2-3 years md deceased never went to another md     Past Surgical History:  Procedure Laterality Date  . AV FISTULA PLACEMENT Left 08/07/2020   Procedure: LEFT ARM ARTERIOVENOUS (AV) FISTULA CREATION;  Surgeon: [name], MD;  Location: [facility];  Service: Vascular;  Laterality: Left;  . BRONCHIAL BIOPSY  11/05/2020   Procedure: BRONCHIAL BIOPSIES;  Surgeon: Collene Gobble, MD;  Location: [facility] ENDOSCOPY;  Service: Cardiopulmonary;;  . BRONCHIAL BRUSHINGS  11/05/2020    Procedure: BRONCHIAL BRUSHINGS;  Surgeon: Collene Gobble, MD;  Location: [facility];  Service: Cardiopulmonary;;  . BRONCHIAL NEEDLE ASPIRATION BIOPSY  11/05/2020   Procedure: BRONCHIAL NEEDLE ASPIRATION BIOPSIES;  Surgeon: Collene Gobble, MD;  Location: [facility];  Service: Cardiopulmonary;;  . CATARACT EXTRACTION Bilateral    11/30 left /12/9 right  . ELECTROMAGNETIC NAVIGATION BROCHOSCOPY N/A 11/05/2020   Procedure: ELECTROMAGNETIC NAVIGATION BRONCHOSCOPY;  Surgeon: Collene Gobble, MD;  Location: [facility];  Service: Cardiopulmonary;  Laterality: N/A;  . ENDOBRONCHIAL ULTRASOUND N/A 11/05/2020   Procedure: ENDOBRONCHIAL ULTRASOUND;  Surgeon: Collene Gobble, MD;  Location: [facility] ENDOSCOPY;  Service: Cardiopulmonary;  Laterality: N/A;  . FINE NEEDLE ASPIRATION  11/05/2020   Procedure: FINE NEEDLE ASPIRATION (FNA) LINEAR;  Surgeon: Collene Gobble, MD;  Location: [facility] ENDOSCOPY;  Service: Cardiopulmonary;;  . IR FLUORO GUIDE CV LINE RIGHT  07/27/2020  . IR THORACENTESIS ASP PLEURAL SPACE W/IMG GUIDE  10/24/2020  . IR THORACENTESIS ASP PLEURAL SPACE W/IMG GUIDE  10/25/2020  . IR THORACENTESIS ASP PLEURAL SPACE W/IMG GUIDE  10/31/2020  . IR US GUIDE VASC ACCESS RIGHT  07/27/2020  . ROBOT ASSISTED LAPAROSCOPIC NEPHRECTOMY Right 01/13/2017   Procedure: XI ROBOTIC ASSISTED LAPAROSCOPIC RADICAL NEPHRECTOMY WITH LYSIS OF ADHESION;  Surgeon: Alexis Frock, MD;  Location: [facility];  Service: Urology;  Laterality: Right;  .  VIDEO BRONCHOSCOPY N/A 11/05/2020   Procedure: VIDEO BRONCHOSCOPY WITH FLUORO;  Surgeon: Collene Gobble, MD;  Location: [facility] ENDOSCOPY;  Service: Cardiopulmonary;  Laterality: N/A;     OB History   No obstetric history on file.     Family History  Problem Relation Age of Onset  . Hypertension Mother   . Pancreatic cancer Father   . Breast cancer Sister     Social History   Tobacco Use  . Smoking status: Former Smoker    Packs/day: 0.50    Years: 30.00    Pack  years: 15.00    Types: Cigarettes    Quit date: 12/14/2000    Years since quitting: 19.9  . Smokeless tobacco: Never Used  Vaping Use  . Vaping Use: Never used  Substance Use Topics  . Alcohol use: Yes    Comment: occasional wine   . Drug use: No    Home Medications Prior to Admission medications   Medication Sig Start Date End Date Taking? Authorizing Provider  amLODipine (NORVASC) 10 MG tablet Take 1 tablet (10 mg total) by mouth daily. 04/19/20   Biagio Borg, MD  amoxicillin-clavulanate (AUGMENTIN) 875-125 MG tablet Take 1 tablet by mouth 2 (two) times daily. 11/18/20   Tanner, Lyndon Code., PA-C  ascorbic acid (VITAMIN C) 500 MG tablet Take 500 mg by mouth 2 (two) times daily.  08/31/20   [provider]  Black Currant Seed Oil 500 MG CAPS Take 500 mg by mouth every morning.     [provider]  Calcium Carbonate-Vit D-Min (CALCIUM 1200 PO) Take 1,200 mg by mouth every morning.     [provider]  carvedilol (COREG) 12.5 MG tablet Take 1 tablet (12.5 mg total) by mouth 2 (two) times daily with a meal. 08/08/20   Georgette Shell, MD  Cholecalciferol (VITAMIN D3) 50 MCG (2000 UT) capsule Take 2,000 Units by mouth every morning. 08/31/20   [provider]  Garlic 7619 MG CAPS Take 2,000 mg by mouth daily.     [provider]  metoprolol succinate (TOPROL-XL) 100 MG 24 hr tablet Take 100 mg by mouth daily. 10/15/20   [provider]  Multiple Vitamin (MULTIVITAMIN WITH MINERALS) TABS tablet Take 1 tablet by mouth every morning.     [provider]  Omega-3 Fatty Acids (FISH OIL PO) Take 1 capsule by mouth every morning.     [provider]  ondansetron (ZOFRAN) 4 MG tablet Take 1 tablet (4 mg total) by mouth every 8 (eight) hours as needed for nausea or vomiting. 04/19/20   Biagio Borg, MD  OVER THE COUNTER MEDICATION Take 1 capsule by mouth daily. Mega juice    [provider]  polyethylene glycol (MIRALAX /  GLYCOLAX) 17 g packet Take 17 g by mouth daily as needed for mild constipation. 09/12/20   Domenic Polite, MD  rosuvastatin (CRESTOR) 10 MG tablet Take 1 tablet (10 mg total) by mouth daily. 09/26/20   Biagio Borg, MD  senna-docusate (SENOKOT-S) 8.6-50 MG tablet Take 1 tablet by mouth daily as needed for mild constipation. Patient not taking: Reported on 10/21/2020 09/12/20   Domenic Polite, MD  telmisartan (MICARDIS) 80 MG tablet Take 80 mg by mouth daily. 10/15/20   [provider]    Allergies    Patient has no known allergies.  Review of Systems   Review of Systems  Gastrointestinal: Positive for nausea.  All other systems reviewed and are negative.  Physical Exam Updated Vital Signs BP (!) 183/71 (BP Location: Right Arm)   Pulse (!) 102   Temp 98.7 F (37.1 C) (Oral)   Resp 14   SpO2 97%   Physical Exam Vitals and nursing note reviewed.  Constitutional:      Appearance: She is well-developed.     Comments: Frail, underweight  HENT:     Head: Normocephalic and atraumatic.     Right Ear: External ear normal.     Left Ear: External ear normal.  Eyes:     General: No scleral icterus.       Right eye: No discharge.        Left eye: No discharge.     Conjunctiva/sclera: Conjunctivae normal.  Neck:     Trachea: No tracheal deviation.  Cardiovascular:     Rate and Rhythm: Normal rate and regular rhythm.  Pulmonary:     Effort: Pulmonary effort is normal. No respiratory distress.     Breath sounds: Normal breath sounds. No stridor. No wheezing or rales.  Abdominal:     General: Bowel sounds are normal. There is no distension.     Palpations: Abdomen is soft.     Tenderness: There is no abdominal tenderness. There is no guarding or rebound.  Musculoskeletal:        General: No tenderness.     Cervical back: Neck supple.  Skin:    General: Skin is warm and dry.     Findings: No rash.  Neurological:     Mental Status: She is alert.     Cranial Nerves: No  cranial nerve deficit (no facial droop, extraocular movements intact, no slurred speech).     Sensory: No sensory deficit.     Motor: No abnormal muscle tone or seizure activity.     Coordination: Coordination normal.     ED Results / Procedures / Treatments   Labs (all labs ordered are listed, but only abnormal results are displayed) Labs Reviewed  CBC - Abnormal; Notable for the following components:      Result Value   WBC 34.8 (*)    RBC 3.72 (*)    Hemoglobin 10.6 (*)    HCT 33.1 (*)    RDW 16.0 (*)    Platelets 448 (*)    All other components within normal limits  COMPREHENSIVE METABOLIC PANEL - Abnormal; Notable for the following components:   Sodium 134 (*)    Potassium 6.8 (*)    Chloride 91 (*)    BUN 63 (*)    Creatinine, Ser 10.96 (*)    Albumin 2.2 (*)    Alkaline Phosphatase 175 (*)    GFR, Estimated 3 (*)    Anion gap 18 (*)    All other components within normal limits  TROPONIN I (HIGH SENSITIVITY) - Abnormal; Notable for the following components:   Troponin I (High Sensitivity) 49 (*)    All other components within normal limits  TROPONIN I (HIGH SENSITIVITY)    EKG EKG Interpretation  Date/Time:  Wednesday November 20 2020 13:07:52 EST Ventricular Rate:  110 PR Interval:  128 QRS Duration: 68 QT Interval:  322 QTC Calculation: 435 R Axis:   27 Text Interpretation: Sinus tachycardia Otherwise normal ECG Since last tracing rate faster t wave amplitude increased since last tracing Confirmed by Dorie Rank [phone]) on 11/20/2020 2:26:35 PM   Radiology DG Chest 2 View  Result Date: 11/20/2020 CLINICAL DATA:  cp EXAM: CHEST - 2 VIEW COMPARISON:  11/18/2020  FINDINGS: Tunneled right IJ CVC tip overlies the superior cavoatrial junction. Cardiomegaly with central pulmonary vascular congestion. Redemonstration of small bilateral pleural effusions. New patchy bilateral pulmonary opacities. Multilevel spondylosis. IMPRESSION: 1. Cardiomegaly with central  pulmonary vascular congestion. Redemonstration of small bilateral pleural effusions. 2. New confluent/patchy bilateral pulmonary opacities. Differential includes edema versus infection. Electronically Signed   By: Primitivo Gauze M.D.   On: 11/20/2020 13:29   DG Chest Bilateral Decubitus  Result Date: 11/18/2020 CLINICAL DATA:  79 year old female with history of pleural effusions and thoracentesis, chest CT last month revealing left hilar mass. Shortness of breath. New right hip pain. EXAM: CHEST - BILATERAL DECUBITUS VIEW COMPARISON:  Portable chest 11/05/2020. Chest CTA 11/02/2020, and earlier. FINDINGS: Bilateral lateral decubitus views of the chest are provided. Both sides demonstrate layering pleural fluid, fairly symmetric with fluid to about 1.5 cm thickness on both sides. This is similar to the CTA chest 11/02/2020. The right side non dependent costophrenic angle clears better than the left. Grossly stable mediastinal contours, and right chest dual lumen dialysis type catheter remains in place. IMPRESSION: Fairly symmetric bilateral layering pleural effusions, fluid volume likely not significantly changed from the CTA 11/02/2020. Electronically Signed   By: Genevie Ann M.D.   On: 11/18/2020 16:38   DG Hip Unilat W or W/O Pelvis 1 View Right  Addendum Date: 11/18/2020   ADDENDUM REPORT: 11/18/2020 16:53 ADDENDUM: Study discussed by telephone with PA-C VAN TANNER on 11/18/2020 at 1645 hours. Electronically Signed   By: Genevie Ann M.D.   On: 11/18/2020 16:53   Result Date: 11/18/2020 CLINICAL DATA:  79 year old female with history of pleural effusions and thoracentesis, chest CT last month revealing left hilar mass. Shortness of breath. New right hip pain. EXAM: DG HIP (WITH OR WITHOUT PELVIS) 1V RIGHT COMPARISON:  CT Abdomen and Pelvis 04/08/2020. FINDINGS: There is a minimally displaced right femoral neck fracture. The fracture appears to extend from the superior mid neck toward the inferior subcapital  neck region. The right femoral head remains normally located. The right femur intertrochanteric segment and visible proximal femoral shaft appear intact. Bone mineralization about the pelvis and proximal femurs does not appear significantly changed from the April CT. No destructive osseous lesion identified radiographically. Pelvis and proximal left femur appear intact. Chronic calcified uterine fibroid again noted. Negative visible other lower abdominal and pelvic visceral contours. IMPRESSION: 1. Minimally displaced right femoral neck fracture, suspicious for pathologic fracture in this setting. 2. MRI versus whole-body bone scan would probably be most sensitive for detection of osseous metastatic disease. Electronically Signed: By: Genevie Ann M.D. On: 11/18/2020 16:41    Procedures .Critical Care Performed by: Dorie Rank, MD Authorized by: Dorie Rank, MD   Critical care provider statement:    Critical care time (minutes):  45   Critical care was time spent personally by me on the following activities:  Discussions with consultants, evaluation of patient's response to treatment, examination of patient, ordering and performing treatments and interventions, ordering and review of laboratory studies, ordering and review of radiographic studies, pulse oximetry, re-evaluation of patient's condition, obtaining history from patient or surrogate and review of old charts   (including critical care time)  Medications Ordered in ED Medications  calcium gluconate 1 g/ 50 mL sodium chloride IVPB (has no administration in time range)  insulin aspart (novoLOG) injection 5 Units (has no administration in time range)    And  dextrose 50 % solution 50 mL (has no administration in time range)  sodium bicarbonate injection 50 mEq (has no administration in time range)  sodium zirconium cyclosilicate (LOKELMA) packet 10 g (has no administration in time range)    ED Course  I have reviewed the triage vital signs  and the nursing notes.  Pertinent labs & imaging results that were available during my care of the patient were reviewed by me and considered in my medical decision making (see chart for details).    MDM Rules/Calculators/A&P                         Patient presents with increasing weakness.  She has known chronic kidney disease and is supposed be on dialysis.  Patient missed her last two sessions.  Patient does have hyperkalemia now with potassium of 6.8.  She does have some subtle EKG changes.  Patient has been treated with calcium sodium bicarb insulin dextrose and Lokelma.  Patient will need urgent dialysis.  I have spoken with Dr.Bhandari     Leukocytosis noted.  Similar to baseline.  Trop elevation likely related to CKD.  Doubt ACS.  Prior records reviewed.  Pt was diagnosed with possible pathologic fx of the hip.  Plan was for outpatient imaging.  pet scan has been ordered.  Could consider imaging while in the hospital after hyperkalemia treated  Final Clinical Impression(s) / ED Diagnoses Final diagnoses:  Hyperkalemia  Anorexia  Malignant neoplasm of lung, unspecified laterality, unspecified part of lung ([place])      Dorie Rank, MD 11/20/20 1521

## 2020-11-21 ENCOUNTER — Inpatient Hospital Stay (HOSPITAL_COMMUNITY): Payer: Medicare Other

## 2020-11-21 ENCOUNTER — Encounter (HOSPITAL_COMMUNITY): Payer: Self-pay | Admitting: Internal Medicine

## 2020-11-21 ENCOUNTER — Ambulatory Visit: Payer: Medicare Other | Admitting: Internal Medicine

## 2020-11-21 ENCOUNTER — Ambulatory Visit: Payer: Medicare Other | Admitting: Orthopaedic Surgery

## 2020-11-21 DIAGNOSIS — E785 Hyperlipidemia, unspecified: Secondary | ICD-10-CM

## 2020-11-21 DIAGNOSIS — C3492 Malignant neoplasm of unspecified part of left bronchus or lung: Secondary | ICD-10-CM

## 2020-11-21 DIAGNOSIS — C3490 Malignant neoplasm of unspecified part of unspecified bronchus or lung: Secondary | ICD-10-CM

## 2020-11-21 DIAGNOSIS — I10 Essential (primary) hypertension: Secondary | ICD-10-CM

## 2020-11-21 DIAGNOSIS — R630 Anorexia: Secondary | ICD-10-CM

## 2020-11-21 DIAGNOSIS — J449 Chronic obstructive pulmonary disease, unspecified: Secondary | ICD-10-CM

## 2020-11-21 DIAGNOSIS — E875 Hyperkalemia: Secondary | ICD-10-CM

## 2020-11-21 LAB — CBC
HCT: 32.1 % — ABNORMAL LOW (ref 36.0–46.0)
Hemoglobin: 10 g/dL — ABNORMAL LOW (ref 12.0–15.0)
MCH: 27.2 pg (ref 26.0–34.0)
MCHC: 31.2 g/dL (ref 30.0–36.0)
MCV: 87.2 fL (ref 80.0–100.0)
Platelets: 367 10*3/uL (ref 150–400)
RBC: 3.68 MIL/uL — ABNORMAL LOW (ref 3.87–5.11)
RDW: 15.9 % — ABNORMAL HIGH (ref 11.5–15.5)
WBC: 30.5 10*3/uL — ABNORMAL HIGH (ref 4.0–10.5)
nRBC: 0 % (ref 0.0–0.2)

## 2020-11-21 LAB — SURGICAL PCR SCREEN
MRSA, PCR: NEGATIVE
Staphylococcus aureus: NEGATIVE

## 2020-11-21 MED ORDER — CEFAZOLIN SODIUM-DEXTROSE 2-4 GM/100ML-% IV SOLN
2.0000 g | Freq: Once | INTRAVENOUS | Status: AC
  Administered 2020-11-22: 2 g via INTRAVENOUS
  Filled 2020-11-21 (×2): qty 100

## 2020-11-21 MED ORDER — HYDRALAZINE HCL 20 MG/ML IJ SOLN: 10.0000 mg | Freq: Four times a day (QID) | INTRAMUSCULAR | Status: DC | PRN

## 2020-11-21 MED ORDER — NEPRO/CARBSTEADY PO LIQD
237.0000 mL | Freq: Two times a day (BID) | ORAL | Status: DC
  Administered 2020-11-24 – 2020-11-26 (×4): 237 mL via ORAL

## 2020-11-21 MED ORDER — PROSOURCE PLUS PO LIQD
30.0000 mL | Freq: Two times a day (BID) | ORAL | Status: DC
  Administered 2020-11-23 – 2020-11-26 (×6): 30 mL via ORAL
  Filled 2020-11-21 (×6): qty 30

## 2020-11-21 MED ORDER — RENA-VITE PO TABS
1.0000 | ORAL_TABLET | Freq: Every day | ORAL | Status: DC
  Administered 2020-11-22 – 2020-11-27 (×6): 1 via ORAL
  Filled 2020-11-21 (×7): qty 1

## 2020-11-21 MED ORDER — SORBITOL 70 % SOLN: 30.0000 mL | Status: DC | PRN

## 2020-11-21 MED ORDER — CHLORHEXIDINE GLUCONATE CLOTH 2 % EX PADS
6.0000 | MEDICATED_PAD | Freq: Every day | CUTANEOUS | Status: DC
  Administered 2020-11-21 – 2020-11-27 (×7): 6 via TOPICAL

## 2020-11-21 MED ORDER — GADOBUTROL 1 MMOL/ML IV SOLN: 5.0000 mL | Freq: Once | INTRAVENOUS | Status: DC | PRN

## 2020-11-21 NOTE — Consult Note (Signed)
Reason for Consult:Right hip fx Referring Physician: Thomasenia Bottoms Time called: 0730 Time at bedside: Cassandra Dyer is an 79 y.o. female.  HPI: Cassandra Dyer was in her usual state of health when she had sudden right hip pain while walking to the bathroom. This happened on Sunday and she has not been able to ambulate or bear weight on that leg since. Because of that she missed HD and was brought to the ED yesterday where x-rays showed a right femoral neck fx and orthopedic surgery was consulted. She has had a recent dx of lung ca and this was thought to be a likely pathologic fx. MRI confirmed this. She c/o localized pain to the hip.  Past Medical History:  Diagnosis Date  . Anemia   . ESRD on hemodialysis ([place])   . Hepatitis    hx of hep C - 10-20 years ago   . History of blood transfusion   . History of kidney cancer   . Hyperlipidemia   . Hypertension    not taken b/p med in 2-3 years md deceased never went to another md   . Renal cell carcinoma ([place]) 2018  . Stage 4 lung cancer ([place])    new - due to start therapy with next appt on 12/14    Past Surgical History:  Procedure Laterality Date  . AV FISTULA PLACEMENT Left 08/07/2020   Procedure: LEFT ARM ARTERIOVENOUS (AV) FISTULA CREATION;  Surgeon: [name], MD;  Location: [facility];  Service: Vascular;  Laterality: Left;  . BRONCHIAL BIOPSY  11/05/2020   Procedure: BRONCHIAL BIOPSIES;  Surgeon: Collene Gobble, MD;  Location: [facility] ENDOSCOPY;  Service: Cardiopulmonary;;  . BRONCHIAL BRUSHINGS  11/05/2020   Procedure: BRONCHIAL BRUSHINGS;  Surgeon: Collene Gobble, MD;  Location: [facility];  Service: Cardiopulmonary;;  . BRONCHIAL NEEDLE ASPIRATION BIOPSY  11/05/2020   Procedure: BRONCHIAL NEEDLE ASPIRATION BIOPSIES;  Surgeon: Collene Gobble, MD;  Location: [facility] [place];  Service: Cardiopulmonary;;  . CATARACT EXTRACTION Bilateral    11/30 left /12/9 right  . ELECTROMAGNETIC NAVIGATION BROCHOSCOPY N/A 11/05/2020   Procedure:  ELECTROMAGNETIC NAVIGATION BRONCHOSCOPY;  Surgeon: Collene Gobble, MD;  Location: [facility];  Service: Cardiopulmonary;  Laterality: N/A;  . ENDOBRONCHIAL ULTRASOUND N/A 11/05/2020   Procedure: ENDOBRONCHIAL ULTRASOUND;  Surgeon: Collene Gobble, MD;  Location: [facility] ENDOSCOPY;  Service: Cardiopulmonary;  Laterality: N/A;  . FINE NEEDLE ASPIRATION  11/05/2020   Procedure: FINE NEEDLE ASPIRATION (FNA) LINEAR;  Surgeon: Collene Gobble, MD;  Location: [facility] ENDOSCOPY;  Service: Cardiopulmonary;;  . IR FLUORO GUIDE CV LINE RIGHT  07/27/2020  . IR THORACENTESIS ASP PLEURAL SPACE W/IMG GUIDE  10/24/2020  . IR THORACENTESIS ASP PLEURAL SPACE W/IMG GUIDE  10/25/2020  . IR THORACENTESIS ASP PLEURAL SPACE W/IMG GUIDE  10/31/2020  . IR US GUIDE VASC ACCESS RIGHT  07/27/2020  . ROBOT ASSISTED LAPAROSCOPIC NEPHRECTOMY Right 01/13/2017   Procedure: XI ROBOTIC ASSISTED LAPAROSCOPIC RADICAL NEPHRECTOMY WITH LYSIS OF ADHESION;  Surgeon: Alexis Frock, MD;  Location: [facility];  Service: Urology;  Laterality: Right;  Marland Kitchen VIDEO BRONCHOSCOPY N/A 11/05/2020   Procedure: VIDEO BRONCHOSCOPY WITH FLUORO;  Surgeon: Collene Gobble, MD;  Location: [facility] ENDOSCOPY;  Service: Cardiopulmonary;  Laterality: N/A;    Family History  Problem Relation Age of Onset  . Hypertension Mother   . Pancreatic cancer Father   . Breast cancer Sister     Social History:  reports that she quit smoking about 19 years ago. Her smoking  use included cigarettes. She has a 15.00 pack-year smoking history. She has never used smokeless tobacco. She reports current alcohol use. She reports that she does not use drugs.  Allergies: No Known Allergies  Medications: I have reviewed the patient's current medications.  Results for orders placed or performed during the hospital encounter of 11/20/20 (from the past 48 hour(s))  CBC     Status: Abnormal   Collection Time: 11/20/20 12:40 PM  Result Value Ref Range   WBC 34.8 (H) 4.0 - 10.5 K/uL   RBC 3.72  (L) 3.87 - 5.11 MIL/uL   Hemoglobin 10.6 (L) 12.0 - 15.0 g/dL   HCT 33.1 (L) 36.0 - 46.0 %   MCV 89.0 80.0 - 100.0 fL   MCH 28.5 26.0 - 34.0 pg   MCHC 32.0 30.0 - 36.0 g/dL   RDW 16.0 (H) 11.5 - 15.5 %   Platelets 448 (H) 150 - 400 K/uL   nRBC 0.0 0.0 - 0.2 %    Comment: Performed at [facility], [address] [place], [place] [address]  Troponin I (High Sensitivity)     Status: Abnormal   Collection Time: 11/20/20 12:40 PM  Result Value Ref Range   Troponin I (High Sensitivity) 49 (H) <18 ng/L    Comment: (NOTE) Elevated high sensitivity troponin I (hsTnI) values and significant  changes across serial measurements may suggest ACS but many other  chronic and acute conditions are known to elevate hsTnI results.  Refer to the Links section for chest pain algorithms and additional  guidance. Performed at [facility], [place] [address] [place], [place] [address]   Comprehensive metabolic panel     Status: Abnormal   Collection Time: 11/20/20 12:40 PM  Result Value Ref Range   Sodium 134 (L) 135 - 145 mmol/L   Potassium 6.8 (HH) 3.5 - 5.1 mmol/L    Comment: SLIGHT HEMOLYSIS CRITICAL RESULT CALLED TO, READ BACK BY AND VERIFIED WITH: BISHOP,L RN @1400  ON [id number] BY FLEMINGS    Chloride 91 (L) 98 - 111 mmol/L   CO2 25 22 - 32 mmol/L   Glucose, Bld 83 70 - 99 mg/dL    Comment: Glucose reference range applies only to samples taken after fasting for at least 8 hours.   BUN 63 (H) 8 - 23 mg/dL   Creatinine, Ser 10.96 (H) 0.44 - 1.00 mg/dL   Calcium 9.8 8.9 - 10.3 mg/dL   Total Protein 6.5 6.5 - 8.1 g/dL   Albumin 2.2 (L) 3.5 - 5.0 g/dL   AST 34 15 - 41 U/L   ALT 16 0 - 44 U/L   Alkaline Phosphatase 175 (H) 38 - 126 U/L   Total Bilirubin 0.9 0.3 - 1.2 mg/dL   GFR, Estimated 3 (L) >60 mL/min    Comment: (NOTE) Calculated using the CKD-EPI Creatinine Equation (2021)    Anion gap 18 (H) 5 - 15    Comment: Performed at [facility], [place] [address],  [place], [place] [address]  Resp Panel by RT-PCR (Flu A&B, Covid) Nasopharyngeal Swab     Status: None   Collection Time: 11/20/20  3:59 PM   Specimen: Nasopharyngeal Swab; Nasopharyngeal(NP) swabs in vial transport medium  Result Value Ref Range   SARS Coronavirus 2 by RT PCR NEGATIVE NEGATIVE    Comment: (NOTE) SARS-CoV-2 target nucleic acids are NOT DETECTED.  The SARS-CoV-2 RNA is generally detectable in upper respiratory specimens during the acute phase of infection. The lowest  concentration of SARS-CoV-2 viral copies this assay can detect is 138 copies/mL. A negative result does not preclude SARS-Cov-2 infection and should not be used as the sole basis for treatment or other patient management decisions. A negative result may occur with  improper specimen collection/handling, submission of specimen other than nasopharyngeal swab, presence of viral mutation(s) within the areas targeted by this assay, and inadequate number of viral copies(<138 copies/mL). A negative result must be combined with clinical observations, patient history, and epidemiological information. The expected result is Negative.  Fact Sheet for Patients:  EntrepreneurPulse.com.au  Fact Sheet for Healthcare Providers:  IncredibleEmployment.be  This test is no t yet approved or cleared by the [place] FDA and  has been authorized for detection and/or diagnosis of SARS-CoV-2 by FDA under an Emergency Use Authorization (EUA). This EUA will remain  in effect (meaning this test can be used) for the duration of the COVID-19 declaration under Section 564(b)(1) of the Act, 21 U.S.C.section 360bbb-3(b)(1), unless the authorization is terminated  or revoked sooner.       Influenza A by PCR NEGATIVE NEGATIVE   Influenza B by PCR NEGATIVE NEGATIVE    Comment: (NOTE) The Xpert Xpress SARS-CoV-2/FLU/RSV plus assay is intended as an aid in the diagnosis of influenza from  Nasopharyngeal swab specimens and should not be used as a sole basis for treatment. Nasal washings and aspirates are unacceptable for Xpert Xpress SARS-CoV-2/FLU/RSV testing.  Fact Sheet for Patients: EntrepreneurPulse.com.au  Fact Sheet for Healthcare Providers: IncredibleEmployment.be  This test is not yet approved or cleared by the [place] FDA and has been authorized for detection and/or diagnosis of SARS-CoV-2 by FDA under an Emergency Use Authorization (EUA). This EUA will remain in effect (meaning this test can be used) for the duration of the COVID-19 declaration under Section 564(b)(1) of the Act, 21 U.S.C. section 360bbb-3(b)(1), unless the authorization is terminated or revoked.  Performed at [facility], [place] [address] [place], [place] [address]   Glucose, capillary     Status: Abnormal   Collection Time: 11/20/20  5:46 PM  Result Value Ref Range   Glucose-Capillary 141 (H) 70 - 99 mg/dL    Comment: Glucose reference range applies only to samples taken after fasting for at least 8 hours.  CBC     Status: Abnormal   Collection Time: 11/21/20  1:17 AM  Result Value Ref Range   WBC 30.5 (H) 4.0 - 10.5 K/uL   RBC 3.68 (L) 3.87 - 5.11 MIL/uL   Hemoglobin 10.0 (L) 12.0 - 15.0 g/dL   HCT 32.1 (L) 36.0 - 46.0 %   MCV 87.2 80.0 - 100.0 fL   MCH 27.2 26.0 - 34.0 pg   MCHC 31.2 30.0 - 36.0 g/dL   RDW 15.9 (H) 11.5 - 15.5 %   Platelets 367 150 - 400 K/uL   nRBC 0.0 0.0 - 0.2 %    Comment: Performed at [facility] [address] [place], [place] [address]  Basic metabolic panel     Status: Abnormal   Collection Time: 11/21/20  1:17 AM  Result Value Ref Range   Sodium 136 135 - 145 mmol/L   Potassium 3.6 3.5 - 5.1 mmol/L   Chloride 95 (L) 98 - 111 mmol/L   CO2 24 22 - 32 mmol/L   Glucose, Bld 94 70 - 99 mg/dL    Comment: Glucose reference range applies only to samples taken after fasting for at least 8 hours.  BUN 15 8 - 23 mg/dL   Creatinine, Ser 3.93 (H) 0.44 - 1.00 mg/dL    Comment: DELTA CHECK NOTED   Calcium 7.9 (L) 8.9 - 10.3 mg/dL   GFR, Estimated 11 (L) >60 mL/min    Comment: (NOTE) Calculated using the CKD-EPI Creatinine Equation (2021)    Anion gap 17 (H) 5 - 15    Comment: Performed at [facility] [address], [place], [place] [address]    DG Chest 2 View  Result Date: 11/20/2020 CLINICAL DATA:  cp EXAM: CHEST - 2 VIEW COMPARISON:  11/18/2020 FINDINGS: Tunneled right IJ CVC tip overlies the superior cavoatrial junction. Cardiomegaly with central pulmonary vascular congestion. Redemonstration of small bilateral pleural effusions. New patchy bilateral pulmonary opacities. Multilevel spondylosis. IMPRESSION: 1. Cardiomegaly with central pulmonary vascular congestion. Redemonstration of small bilateral pleural effusions. 2. New confluent/patchy bilateral pulmonary opacities. Differential includes edema versus infection. Electronically Signed   By: Primitivo Gauze M.D.   On: 11/20/2020 13:29   MR FEMUR RIGHT W WO CONTRAST  Result Date: 11/20/2020 CLINICAL DATA:  Fracture with metastatic disease EXAM: MRI OF THE RIGHT FEMUR WITHOUT AND WITH CONTRAST TECHNIQUE: Multiplanar, multisequence MR imaging of the right was performed both before and after administration of intravenous contrast. CONTRAST:  15mL GADAVIST GADOBUTROL 1 MMOL/ML IV SOLN COMPARISON:  None. FINDINGS: Bones/Joint/Cartilage There is a nondisplaced slightly impacted subcapital right femoral neck fracture as on the recent exam. There is a heterogeneous T1 isointense/heterogeneously T2 bright peripherally enhancing lesion within the medial right femoral neck. Mild surrounding marrow edema seen. No other fracture is identified. There is moderate bilateral hip osteoarthritis. Within the left femoral head/neck there is also a T1 hypointense/T2 heterogeneous lesion. A moderate right hip joint effusion is seen. Ligaments The  ligamentum teres appears to be grossly intact Muscles and Tendons There is edema seen within the adductor musculature and iliopsoas musculature. The tendons appear to be grossly intact. Soft tissues Soft tissue swelling seen along the lateral aspect of the hip. Multiple hypointense uterine fibroids are seen. There is cystic lesion seen within the left adnexa which is partially visualized. IMPRESSION: Nondisplaced slightly impacted subcapital right femoral neck pathologic fracture. There is a osseous lesion within the right femoral neck, likely metastatic lesion . Would recommend nuclear medicine bone scan for further evaluation other osseous lesions. Osseous lesion within the left femoral head/neck concerning for metastatic lesion. Moderate right hip osteoarthritis and joint effusion Electronically Signed   By: Prudencio Pair M.D.   On: 11/20/2020 20:18    Review of Systems  HENT: Negative for ear discharge, ear pain, hearing loss and tinnitus.   Eyes: Negative for photophobia and pain.  Respiratory: Negative for cough and shortness of breath.   Cardiovascular: Negative for chest pain.  Gastrointestinal: Negative for abdominal pain, nausea and vomiting.  Genitourinary: Negative for dysuria, flank pain, frequency and urgency.  Musculoskeletal: Positive for arthralgias (Right hip). Negative for back pain, myalgias and neck pain.  Neurological: Negative for dizziness and headaches.  Hematological: Does not bruise/bleed easily.  Psychiatric/Behavioral: The patient is not nervous/anxious.    Blood pressure (!) 169/81, pulse (!) 108, temperature 98 F (36.7 C), temperature source Oral, resp. rate 16, height 5\' 2"  (1.575 m), weight 49.3 kg, SpO2 94 %. Physical Exam Constitutional:      General: She is not in acute distress.    Appearance: She is well-developed and well-nourished. She is not diaphoretic.  HENT:     Head: Normocephalic and atraumatic.  Eyes:     General: No scleral icterus.       Right  eye: No discharge.        Left eye: No discharge.     Conjunctiva/sclera: Conjunctivae normal.  Cardiovascular:     Rate and Rhythm: Normal rate and regular rhythm.  Pulmonary:     Effort: Pulmonary effort is normal. No respiratory distress.  Musculoskeletal:     Cervical back: Normal range of motion.     Comments: RLE No traumatic wounds, ecchymosis, or rash  Mod TTP hip  No knee or ankle effusion  Knee stable to varus/ valgus and anterior/posterior stress  Sens DPN, SPN, TN intact  Motor EHL, ext, flex, evers 5/5  DP 0, PT 2+, No significant edema  Skin:    General: Skin is warm and dry.  Neurological:     Mental Status: She is alert.  Psychiatric:        Mood and Affect: Mood and affect normal.        Behavior: Behavior normal.     Assessment/Plan: Right hip fx -- Dr. Erlinda Hong to evaluate, will likely need some sort of palliative fixation. Multiple medical problems including HTN; HLD; recent diagnosis of lung cancer; remote h/o renal cell carcinoma; COPD on 2L home O2; and ESRD on HD -- per primary service    Lisette Abu, PA-C Orthopedic Surgery [phone] 11/21/2020, 10:10 AM

## 2020-11-21 NOTE — Progress Notes (Addendum)
PROGRESS NOTE  Cassandra Dyer EGB:[id number] DOB: 01-Dec-1941 DOA: 11/20/2020 PCP: Biagio Borg, MD   LOS: 1 day   Brief narrative: As per HPI,  Cassandra Dyer is a 79 y.o. female with medical history significant of  hypertension, hyperlipidemia, recent diagnosis of lung cancer; remote h/o renal cell carcinoma; COPD on 2L home O2; and ESRD on HD presented to hospital with generalized  weakness, missed HD.   She also complained of right hip pain and ambulatory dysfunction.  She was unable to go to hemodialysis because of pain.  She also complained of generalized weakness, poor appetite and weight loss.  Of note, patient was previously hospitalized from 11/8-24 after missed HD x 1.  She presented with acute on chronic hypoxic respiratory failure and underwent thoracentesis x 3.  2D Echo was performed with grade 2 diastolic dysfunction.  She was found to have a L hilar mass and bronchoscopy with endobronchial [place] was performed.  She was subsequently diagnosed with stage IV (T3, N2, M1a) NSCLC.  MRI brain and PET scan were ordered.   She was seen by Rad Onc on 12/2 with the plan for palliative radiation.  She was seen again by the oncology PA on 12/6 with c/o SOB and R hip pain.  CXR showed stable pleural effusions and R hip film with minimally displaced R femoral neck fracture concerning for pathologic fracture.  She was told to stay off the hip and has been in a wheelchair since.    In the ED, patient was noted to be mildly hyperkalemic.  Nephrology was consulted for hemodialysis.  She had missed hemodialysis on Monday and Wednesday.  Patient was then admitted hospital for further evaluation and treatment.   Assessment/Plan:  Principal Problem:   Noncompliance of patient with renal dialysis ([place]) Active Problems:   Essential hypertension   Dyslipidemia   Hyperkalemia, diminished renal excretion   COPD (chronic obstructive pulmonary disease) (HCC)   Adenocarcinoma of left lung, stage 4 (HCC)    Acute right hip pain  ESRD on hemodialysis, missed hemodialysis  Nephrology has been consulted for hemodialysis, had mild hyperkalemia on presentation.  Had missed 2 sessions of hemodialysis  Hyperkalemia -Due to ESRD.  Received temporizing measures in the ED including calcium gluconate insulin glucose. Potassium has improved at this time at 3.6.  Acute right hip pain with pathological femoral neck fracture. -Orthopedics was consulted from the ED.  MRI of the hip showed impacted subcapital right femoral neck pathological fracture.  Will follow orthopedic recommendations.    Stage 4 lung cancer, recent diagnosis. Patient was supposed to start radiation soon.  MRI of the brain has been ordered.  Chest x-ray done yesterday showed cardiomegaly with central vascular congestion and new confluent patchy bilateral opacities.  Respiratory panel was negative for Covid and influenza.  Essential hypertension Continue Coreg, Micardis.  Discontinue Toprol-XL.  Add IV hydralazine as needed hypertension.  Hyperlipidemia -Continue Crestor  COPD with chronic hypoxic respiratory failure. On 2 L of oxygen by nasal cannula.  Continue bronchodilators.  Significant leukocytosis.  Possible reactive in nature.  Uncertain etiology.  No fever.  Patient has had WBC elevation for more than 2 weeks now.  DVT prophylaxis: SCDs Start: 11/20/20 1624, heparin subcu   Code Status: DNR  Family Communication: I tried to reach the patient's friend listed in the computer [place] but was unable to reach him  Status is: Inpatient  Remains inpatient appropriate because:Ongoing diagnostic testing needed not appropriate for outpatient work up,  Unsafe d/c plan, IV treatments appropriate due to intensity of illness or inability to take PO and Inpatient level of care appropriate due to severity of illness   Dispo: The patient is from: Home              Anticipated d/c is to: Undetermined at this time.  Might need  skilled nursing facility placement.              Anticipated d/c date is: 2 days              Patient currently is not medically stable to d/c.  Consultants: Nephrology;  Orthopedics;  nutrition  Procedures:  Hemodialysis  Antibiotics:  . None  Anti-infectives (From admission, onward)   None     Subjective: Today, patient was seen and examined at bedside.  Complains of generalized weakness.  Complains of pain especially moving the right hip.  Denies shortness of breath chest pain palpitation  Objective: Vitals:   11/21/20 0103 11/21/20 0539  BP: (!) 166/70 (!) 150/74  Pulse: (!) 110 (!) 109  Resp: 16 17  Temp: 98.4 F (36.9 C) 98.2 F (36.8 C)  SpO2: 100% 100%    Intake/Output Summary (Last 24 hours) at 11/21/2020 0758 Last data filed at 11/21/2020 0600 Gross per 24 hour  Intake 0 ml  Output 1000 ml  Net -1000 ml   Filed Weights   11/20/20 2040 11/21/20 0024  Weight: 50.9 kg 49.3 kg   Body mass index is 19.88 kg/m.   Physical Exam:  GENERAL: Patient is alert awake and communicative, not in obvious distress.  Thinly built, on nasal cannula oxygen at 2 L/min HENT: No scleral pallor or icterus. Pupils equally reactive to light. Oral mucosa is moist.  Right internal jugular hemodialysis catheter NECK: is supple, no gross swelling noted. CHEST: Decreased breath sounds bilaterally. CVS: S1 and S2 heard, no murmur. Regular rate and rhythm.  ABDOMEN: Soft, non-tender, bowel sounds are present. EXTREMITIES: Right with tenderness on palpation, unable to move CNS: Cranial nerves are intact. No focal motor deficits. SKIN: warm and dry without rashes.  Data Review: I have personally reviewed the following laboratory data and studies,  CBC: Recent Labs  Lab 11/14/20 1321 11/18/20 1338 11/20/20 1240 11/21/20 0117  WBC 19.7* 33.8* 34.8* 30.5*  NEUTROABS 16.9* 30.5*  --   --   HGB 10.1* 10.3* 10.6* 10.0*  HCT 32.7* 34.4* 33.1* 32.1*  MCV 87.7 88.7 89.0 87.2   PLT 335 412* 448* 834   Basic Metabolic Panel: Recent Labs  Lab 11/14/20 1321 11/18/20 1338 11/20/20 1240 11/21/20 0117  NA 135 138 134* 136  K 4.1 5.5* 6.8* 3.6  CL 92* 94* 91* 95*  CO2 28 32 25 24  GLUCOSE 92 140* 83 94  BUN 17 40* 63* 15  CREATININE 4.44* 7.90* 10.96* 3.93*  CALCIUM 9.7 10.5* 9.8 7.9*   Liver Function Tests: Recent Labs  Lab 11/14/20 1321 11/18/20 1338 11/20/20 1240  AST 34 25 34  ALT 23 18 16   ALKPHOS 131* 163* 175*  BILITOT 0.6 0.5 0.9  PROT 6.7 6.9 6.5  ALBUMIN 2.2* 2.1* 2.2*   No results for input(s): LIPASE, AMYLASE in the last 168 hours. No results for input(s): AMMONIA in the last 168 hours. Cardiac Enzymes: No results for input(s): CKTOTAL, CKMB, CKMBINDEX, TROPONINI in the last 168 hours. BNP (last 3 results) No results for input(s): BNP in the last 8760 hours.  ProBNP (last 3 results) No results for input(s):  PROBNP in the last 8760 hours.  CBG: Recent Labs  Lab 11/20/20 1746  GLUCAP 141*   Recent Results (from the past 240 hour(s))  Resp Panel by RT-PCR (Flu A&B, Covid) Nasopharyngeal Swab     Status: None   Collection Time: 11/20/20  3:59 PM   Specimen: Nasopharyngeal Swab; Nasopharyngeal(NP) swabs in vial transport medium  Result Value Ref Range Status   SARS Coronavirus 2 by RT PCR NEGATIVE NEGATIVE Final    Comment: (NOTE) SARS-CoV-2 target nucleic acids are NOT DETECTED.  The SARS-CoV-2 RNA is generally detectable in upper respiratory specimens during the acute phase of infection. The lowest concentration of SARS-CoV-2 viral copies this assay can detect is 138 copies/mL. A negative result does not preclude SARS-Cov-2 infection and should not be used as the sole basis for treatment or other patient management decisions. A negative result may occur with  improper specimen collection/handling, submission of specimen other than nasopharyngeal swab, presence of viral mutation(s) within the areas targeted by this assay,  and inadequate number of viral copies(<138 copies/mL). A negative result must be combined with clinical observations, patient history, and epidemiological information. The expected result is Negative.  Fact Sheet for Patients:  EntrepreneurPulse.com.au  Fact Sheet for Healthcare Providers:  IncredibleEmployment.be  This test is no t yet approved or cleared by the [place] FDA and  has been authorized for detection and/or diagnosis of SARS-CoV-2 by FDA under an Emergency Use Authorization (EUA). This EUA will remain  in effect (meaning this test can be used) for the duration of the COVID-19 declaration under Section 564(b)(1) of the Act, 21 U.S.C.section 360bbb-3(b)(1), unless the authorization is terminated  or revoked sooner.       Influenza A by PCR NEGATIVE NEGATIVE Final   Influenza B by PCR NEGATIVE NEGATIVE Final    Comment: (NOTE) The Xpert Xpress SARS-CoV-2/FLU/RSV plus assay is intended as an aid in the diagnosis of influenza from Nasopharyngeal swab specimens and should not be used as a sole basis for treatment. Nasal washings and aspirates are unacceptable for Xpert Xpress SARS-CoV-2/FLU/RSV testing.  Fact Sheet for Patients: EntrepreneurPulse.com.au  Fact Sheet for Healthcare Providers: IncredibleEmployment.be  This test is not yet approved or cleared by the [place] FDA and has been authorized for detection and/or diagnosis of SARS-CoV-2 by FDA under an Emergency Use Authorization (EUA). This EUA will remain in effect (meaning this test can be used) for the duration of the COVID-19 declaration under Section 564(b)(1) of the Act, 21 U.S.C. section 360bbb-3(b)(1), unless the authorization is terminated or revoked.  Performed at [facility], [place] [address] [place], [place] [address]      Studies: DG Chest 2 View  Result Date: 11/20/2020 CLINICAL DATA:  cp EXAM: CHEST -  2 VIEW COMPARISON:  11/18/2020 FINDINGS: Tunneled right IJ CVC tip overlies the superior cavoatrial junction. Cardiomegaly with central pulmonary vascular congestion. Redemonstration of small bilateral pleural effusions. New patchy bilateral pulmonary opacities. Multilevel spondylosis. IMPRESSION: 1. Cardiomegaly with central pulmonary vascular congestion. Redemonstration of small bilateral pleural effusions. 2. New confluent/patchy bilateral pulmonary opacities. Differential includes edema versus infection. Electronically Signed   By: Primitivo Gauze M.D.   On: 11/20/2020 13:29   MR FEMUR RIGHT W WO CONTRAST  Result Date: 11/20/2020 CLINICAL DATA:  Fracture with metastatic disease EXAM: MRI OF THE RIGHT FEMUR WITHOUT AND WITH CONTRAST TECHNIQUE: Multiplanar, multisequence MR imaging of the right was performed both before and after administration of intravenous contrast. CONTRAST:  14mL GADAVIST GADOBUTROL 1  MMOL/ML IV SOLN COMPARISON:  None. FINDINGS: Bones/Joint/Cartilage There is a nondisplaced slightly impacted subcapital right femoral neck fracture as on the recent exam. There is a heterogeneous T1 isointense/heterogeneously T2 bright peripherally enhancing lesion within the medial right femoral neck. Mild surrounding marrow edema seen. No other fracture is identified. There is moderate bilateral hip osteoarthritis. Within the left femoral head/neck there is also a T1 hypointense/T2 heterogeneous lesion. A moderate right hip joint effusion is seen. Ligaments The ligamentum teres appears to be grossly intact Muscles and Tendons There is edema seen within the adductor musculature and iliopsoas musculature. The tendons appear to be grossly intact. Soft tissues Soft tissue swelling seen along the lateral aspect of the hip. Multiple hypointense uterine fibroids are seen. There is cystic lesion seen within the left adnexa which is partially visualized. IMPRESSION: Nondisplaced slightly impacted subcapital  right femoral neck pathologic fracture. There is a osseous lesion within the right femoral neck, likely metastatic lesion . Would recommend nuclear medicine bone scan for further evaluation other osseous lesions. Osseous lesion within the left femoral head/neck concerning for metastatic lesion. Moderate right hip osteoarthritis and joint effusion Electronically Signed   By: Prudencio Pair M.D.   On: 11/20/2020 20:18     Flora Lipps, MD  [facility] 11/21/2020

## 2020-11-21 NOTE — Progress Notes (Addendum)
Subjective: Seen in room, tolerated dialysis last p.m. says feels better, noted orthopedic evaluating right hip fracture  Objective Vital signs in last 24 hours: Vitals:   11/21/20 0103 11/21/20 0300 11/21/20 0539 11/21/20 0912  BP: (!) 166/70  (!) 150/74 (!) 169/81  Pulse: (!) 110  (!) 109 (!) 108  Resp: 16  17 16   Temp: 98.4 F (36.9 C)  98.2 F (36.8 C) 98 F (36.7 C)  TempSrc:    Oral  SpO2: 100%  100% 94%  Weight:      Height:  5\' 2"  (1.575 m)     Weight change:   Physical Exam: General:  alert NAD Heart:  RRR, no MRG Lungs:  CTA, nonlabored breathing Abdomen: Bowel sounds normoactive, soft nontender nondistended, no ascites Extremities: No pedal edema Dialysis Access: Positive bruit left basilic vein AV fistula, IJ PermCath  Dialysis Orders: Center: [place] on MWF, 4 hours EDW 48.5 kg, [place] 2.0 potassium 2.0 calcium, 2000 units heparin.  Venofer 50 mg,  no ESA secondary to cancer,  Access =left BC aVF placed 08/07/2020, right IJ PermCath   Problem/Plan: 1. Hyperkalemia secondary to missed dialysis, also some diet indiscretion /K= 6.8=12/08, resolved after hemodialysis K= 3.6=12/09  2. ESRD -HD normal MWF, now on schedule/has left upper arm AV fistula placed September 07, 2020 okay to use as an outpatient per [facility] op  notes =use next treatment discussed with patient 3. Hypertension/volume  -mild hypertension in ER may be pain, and this a.m. volume okay, BP meds =amlodipine 10 mg daily, , Avapro 150 mg daily Coreg 12.5 mg twice daily 4. Anemia  -Hgb 10.6> 10.0, continue weekly iron, not on ESA secondary to cancer 5. Metabolic bone disease -corrected calcium 9.2, phosphorus 4.8 ,no current phosphate binder listed, follow-up lab trend 6. Right femoral neck fracture= plan per admit team/ orthopedics 7. Recent diagnosed lung cancer= followed by Dr. Earlie Server oncology per patient has appointment December 14 to discuss   Ernest Haber, PA-C [facility]  [phone] 11/21/2020,10:47 AM  LOS: 1 day   Labs: Basic Metabolic Panel: Recent Labs  Lab 11/18/20 1338 11/20/20 1240 11/21/20 0117  NA 138 134* 136  K 5.5* 6.8* 3.6  CL 94* 91* 95*  CO2 32 25 24  GLUCOSE 140* 83 94  BUN 40* 63* 15  CREATININE 7.90* 10.96* 3.93*  CALCIUM 10.5* 9.8 7.9*   Liver Function Tests: Recent Labs  Lab 11/14/20 1321 11/18/20 1338 11/20/20 1240  AST 34 25 34  ALT 23 18 16   ALKPHOS 131* 163* 175*  BILITOT 0.6 0.5 0.9  PROT 6.7 6.9 6.5  ALBUMIN 2.2* 2.1* 2.2*   No results for input(s): LIPASE, AMYLASE in the last 168 hours. No results for input(s): AMMONIA in the last 168 hours. CBC: Recent Labs  Lab 11/14/20 1321 11/18/20 1338 11/20/20 1240 11/21/20 0117  WBC 19.7* 33.8* 34.8* 30.5*  NEUTROABS 16.9* 30.5*  --   --   HGB 10.1* 10.3* 10.6* 10.0*  HCT 32.7* 34.4* 33.1* 32.1*  MCV 87.7 88.7 89.0 87.2  PLT 335 412* 448* 367   Cardiac Enzymes: No results for input(s): CKTOTAL, CKMB, CKMBINDEX, TROPONINI in the last 168 hours. CBG: Recent Labs  Lab 11/20/20 1746  GLUCAP 141*    Studies/Results: DG Chest 2 View  Result Date: 11/20/2020 CLINICAL DATA:  cp EXAM: CHEST - 2 VIEW COMPARISON:  11/18/2020 FINDINGS: Tunneled right IJ CVC tip overlies the superior cavoatrial junction. Cardiomegaly with central pulmonary vascular congestion. Redemonstration of small bilateral  pleural effusions. New patchy bilateral pulmonary opacities. Multilevel spondylosis. IMPRESSION: 1. Cardiomegaly with central pulmonary vascular congestion. Redemonstration of small bilateral pleural effusions. 2. New confluent/patchy bilateral pulmonary opacities. Differential includes edema versus infection. Electronically Signed   By: Primitivo Gauze M.D.   On: 11/20/2020 13:29   MR FEMUR RIGHT W WO CONTRAST  Result Date: 11/20/2020 CLINICAL DATA:  Fracture with metastatic disease EXAM: MRI OF THE RIGHT FEMUR WITHOUT AND WITH CONTRAST TECHNIQUE: Multiplanar, multisequence  MR imaging of the right was performed both before and after administration of intravenous contrast. CONTRAST:  68mL GADAVIST GADOBUTROL 1 MMOL/ML IV SOLN COMPARISON:  None. FINDINGS: Bones/Joint/Cartilage There is a nondisplaced slightly impacted subcapital right femoral neck fracture as on the recent exam. There is a heterogeneous T1 isointense/heterogeneously T2 bright peripherally enhancing lesion within the medial right femoral neck. Mild surrounding marrow edema seen. No other fracture is identified. There is moderate bilateral hip osteoarthritis. Within the left femoral head/neck there is also a T1 hypointense/T2 heterogeneous lesion. A moderate right hip joint effusion is seen. Ligaments The ligamentum teres appears to be grossly intact Muscles and Tendons There is edema seen within the adductor musculature and iliopsoas musculature. The tendons appear to be grossly intact. Soft tissues Soft tissue swelling seen along the lateral aspect of the hip. Multiple hypointense uterine fibroids are seen. There is cystic lesion seen within the left adnexa which is partially visualized. IMPRESSION: Nondisplaced slightly impacted subcapital right femoral neck pathologic fracture. There is a osseous lesion within the right femoral neck, likely metastatic lesion . Would recommend nuclear medicine bone scan for further evaluation other osseous lesions. Osseous lesion within the left femoral head/neck concerning for metastatic lesion. Moderate right hip osteoarthritis and joint effusion Electronically Signed   By: Prudencio Pair M.D.   On: 11/20/2020 20:18   Medications: . [START ON 11/27/2020] iron sucrose    . methocarbamol (ROBAXIN) IV     . amLODipine  10 mg Oral Daily  . carvedilol  12.5 mg Oral BID WC  . Chlorhexidine Gluconate Cloth  6 each Topical Daily  . docusate sodium  100 mg Oral BID  . irbesartan  150 mg Oral Daily  . rosuvastatin  10 mg Oral Daily

## 2020-11-21 NOTE — Progress Notes (Signed)
Initial Nutrition Assessment  DOCUMENTATION CODES:   Not applicable  INTERVENTION:   Nepro Shake po BID, each supplement provides 425 kcal and 19 grams protein  72ml Prosource Plus po BID, each supplement provides 100 kcals and 15 grams of protein  Renavit daily   NUTRITION DIAGNOSIS:   Inadequate oral intake related to poor appetite as evidenced by per patient/family report.    GOAL:   Patient will meet greater than or equal to 90% of their needs    MONITOR:   PO intake,Supplement acceptance,Labs,I & O's,Weight trends  REASON FOR ASSESSMENT:   Consult Assessment of nutrition requirement/status,Hip fracture protocol  ASSESSMENT:   Pt presented with hyperkalemia 2/2 having missed 2 HD sessions. Pt missed the sessions due to pain in her leg/hip which was confirmed by MRI to be R femoral neck fracture. PMH includes HTN, HLD, recent diagnosis of lung cancer (preparing to start palliative radiation therapy), remote h/o renal cell carcinoma, COPD on 2L, and ESRD on HD.  Per orthopedics, pt to have surgical intervention.  Pt unavailable at time of RD visit. Per H&P, pt has had a poor appetite for "some time" and reported a 25lb weight loss.   No PO intake documented.   Reviewed weight history. Pt noted to have weighed 64.4 kg on 05/23/20 and now weighs 49.3 kg. This would inidicate a 23% wt loss x 6 months, which is severe and significant for time frame. Although weights are likely inaccurate due to fluid accumulation, pt with a clear downward trend in weight. Pt is likely malnourished given reports of poor po intake for "some time," significant wt loss, and lower BMI; however, unable to diagnose at this time without more detailed diet history and/or nutrition-focused physical exam.   Pt had HD today. Net UF 1L.   EDW 48.5 kg Current wt 49.3 kg  No UOP documented.  Labs and medications reviewed.   NUTRITION - FOCUSED PHYSICAL EXAM:  Unable to perform at this time;  will attempt at follow-up.   Diet Order:   Diet Order            Diet NPO time specified  Diet effective midnight           Diet renal with fluid restriction Fluid restriction: 1200 mL Fluid; Room service appropriate? Yes; Fluid consistency: Thin  Diet effective now                 EDUCATION NEEDS:   No education needs have been identified at this time  Skin:  Skin Assessment: Skin Integrity Issues: Skin Integrity Issues:: Other (Comment) Other: MASD buttocks  Last BM:  12/8  Height:   Ht Readings from Last 1 Encounters:  11/21/20 5\' 2"  (1.575 m)    Weight:   Wt Readings from Last 1 Encounters:  11/21/20 49.3 kg    BMI:  Body mass index is 19.88 kg/m.  Estimated Nutritional Needs:   Kcal:  1400-1600  Protein:  70-85 grams  Fluid:  1L + UOP    Larkin Ina, MS, RD, LDN RD pager number and weekend/on-call pager number located in [place].

## 2020-11-21 NOTE — Progress Notes (Signed)
Orthopedic Tech Progress Note Patient Details:  Cassandra Dyer Jun 24, 1941 [id number] Applied overhead frame. Patient ID: [name], female   DOB: Aug 31, 1941, 79 y.o.   MRN: [id number]   Chip Boer 11/21/2020, 8:06 AM

## 2020-11-22 ENCOUNTER — Inpatient Hospital Stay (HOSPITAL_COMMUNITY): Payer: Medicare Other | Admitting: Anesthesiology

## 2020-11-22 ENCOUNTER — Inpatient Hospital Stay (HOSPITAL_COMMUNITY): Payer: Medicare Other

## 2020-11-22 ENCOUNTER — Other Ambulatory Visit: Payer: Self-pay

## 2020-11-22 ENCOUNTER — Encounter (HOSPITAL_COMMUNITY)
Admission: EM | Disposition: A | Discharge status: Home Health | Payer: Self-pay | Source: Home / Self Care | Attending: Internal Medicine

## 2020-11-22 ENCOUNTER — Encounter (HOSPITAL_COMMUNITY): Payer: Self-pay | Admitting: Internal Medicine

## 2020-11-22 DIAGNOSIS — S72001A Fracture of unspecified part of neck of right femur, initial encounter for closed fracture: Secondary | ICD-10-CM | POA: Insufficient documentation

## 2020-11-22 DIAGNOSIS — M25551 Pain in right hip: Secondary | ICD-10-CM

## 2020-11-22 DIAGNOSIS — Z96649 Presence of unspecified artificial hip joint: Secondary | ICD-10-CM

## 2020-11-22 HISTORY — PX: ANTERIOR APPROACH HEMI HIP ARTHROPLASTY: SHX6690

## 2020-11-22 LAB — BASIC METABOLIC PANEL
Anion gap: 17 — ABNORMAL HIGH (ref 5–15)
BUN: 15 mg/dL (ref 8–23)
CO2: 24 mmol/L (ref 22–32)
Calcium: 7.9 mg/dL — ABNORMAL LOW (ref 8.9–10.3)
Chloride: 95 mmol/L — ABNORMAL LOW (ref 98–111)
Creatinine, Ser: 3.93 mg/dL — ABNORMAL HIGH (ref 0.44–1.00)
GFR, Estimated: 11 mL/min — ABNORMAL LOW (ref >60)
Glucose, Bld: 94 mg/dL (ref 70–99)
Potassium: 3.6 mmol/L (ref 3.5–5.1)
Sodium: 136 mmol/L (ref 135–145)

## 2020-11-22 LAB — COMPREHENSIVE METABOLIC PANEL
ALT: 12 U/L (ref 0–44)
AST: 29 U/L (ref 15–41)
Albumin: 1.6 g/dL — ABNORMAL LOW (ref 3.5–5.0)
Alkaline Phosphatase: 130 U/L — ABNORMAL HIGH (ref 38–126)
Anion gap: 12 (ref 5–15)
BUN: 5 mg/dL — ABNORMAL LOW (ref 8–23)
CO2: 26 mmol/L (ref 22–32)
Calcium: 7.7 mg/dL — ABNORMAL LOW (ref 8.9–10.3)
Chloride: 96 mmol/L — ABNORMAL LOW (ref 98–111)
Creatinine, Ser: 1.82 mg/dL — ABNORMAL HIGH (ref 0.44–1.00)
GFR, Estimated: 28 mL/min — ABNORMAL LOW (ref >60)
Glucose, Bld: 107 mg/dL — ABNORMAL HIGH (ref 70–99)
Potassium: 4.3 mmol/L (ref 3.5–5.1)
Sodium: 134 mmol/L — ABNORMAL LOW (ref 135–145)
Total Bilirubin: 0.7 mg/dL (ref 0.3–1.2)
Total Protein: 5 g/dL — ABNORMAL LOW (ref 6.5–8.1)

## 2020-11-22 LAB — CBC
HCT: 27.2 % — ABNORMAL LOW (ref 36.0–46.0)
Hemoglobin: 8.3 g/dL — ABNORMAL LOW (ref 12.0–15.0)
MCH: 27.3 pg (ref 26.0–34.0)
MCHC: 30.5 g/dL (ref 30.0–36.0)
MCV: 89.5 fL (ref 80.0–100.0)
Platelets: 302 10*3/uL (ref 150–400)
RBC: 3.04 MIL/uL — ABNORMAL LOW (ref 3.87–5.11)
RDW: 16.1 % — ABNORMAL HIGH (ref 11.5–15.5)
WBC: 22.9 10*3/uL — ABNORMAL HIGH (ref 4.0–10.5)
nRBC: 0 % (ref 0.0–0.2)

## 2020-11-22 LAB — MAGNESIUM: Magnesium: 1.8 mg/dL (ref 1.7–2.4)

## 2020-11-22 LAB — TYPE AND SCREEN
ABO/RH(D): O POS
Antibody Screen: NEGATIVE

## 2020-11-22 LAB — GLUCOSE, CAPILLARY: Glucose-Capillary: 101 mg/dL — ABNORMAL HIGH (ref 70–99)

## 2020-11-22 SURGERY — HEMIARTHROPLASTY, HIP, DIRECT ANTERIOR APPROACH, FOR FRACTURE
Anesthesia: General | Laterality: Right

## 2020-11-22 MED ORDER — OXYCODONE-ACETAMINOPHEN 5-325 MG PO TABS: 1.0000 | ORAL_TABLET | Freq: Three times a day (TID) | ORAL | 0 refills | Status: DC | PRN

## 2020-11-22 MED ORDER — ONDANSETRON HCL 4 MG/2ML IJ SOLN
INTRAMUSCULAR | Status: DC | PRN
  Administered 2020-11-22: 4 mg via INTRAVENOUS

## 2020-11-22 MED ORDER — OXYCODONE HCL 5 MG PO TABS
10.0000 mg | ORAL_TABLET | ORAL | Status: DC | PRN
  Administered 2020-11-27: 10 mg via ORAL

## 2020-11-22 MED ORDER — ROCURONIUM BROMIDE 10 MG/ML (PF) SYRINGE
PREFILLED_SYRINGE | INTRAVENOUS | Status: AC
  Filled 2020-11-22: qty 10

## 2020-11-22 MED ORDER — BUPIVACAINE LIPOSOME 1.3 % IJ SUSP
20.0000 mL | Freq: Once | INTRAMUSCULAR | Status: DC
  Filled 2020-11-22: qty 20

## 2020-11-22 MED ORDER — HYDROMORPHONE HCL 1 MG/ML IJ SOLN: 0.5000 mg | INTRAMUSCULAR | Status: DC | PRN

## 2020-11-22 MED ORDER — SORBITOL 70 % SOLN: 30.0000 mL | Freq: Every day | Status: DC | PRN

## 2020-11-22 MED ORDER — MAGNESIUM CITRATE PO SOLN: 1.0000 | Freq: Once | ORAL | Status: DC | PRN

## 2020-11-22 MED ORDER — HEPARIN SODIUM (PORCINE) 1000 UNIT/ML IJ SOLN
INTRAMUSCULAR | Status: AC
  Filled 2020-11-22: qty 4

## 2020-11-22 MED ORDER — FENTANYL CITRATE (PF) 250 MCG/5ML IJ SOLN
INTRAMUSCULAR | Status: AC
  Filled 2020-11-22: qty 5

## 2020-11-22 MED ORDER — TRANEXAMIC ACID-NACL 1000-0.7 MG/100ML-% IV SOLN
INTRAVENOUS | Status: AC
  Filled 2020-11-22: qty 100

## 2020-11-22 MED ORDER — METHOCARBAMOL 500 MG PO TABS
500.0000 mg | ORAL_TABLET | Freq: Four times a day (QID) | ORAL | Status: DC | PRN
  Administered 2020-11-23 – 2020-11-24 (×3): 500 mg via ORAL
  Filled 2020-11-22 (×3): qty 1

## 2020-11-22 MED ORDER — CHLORHEXIDINE GLUCONATE 0.12 % MT SOLN
OROMUCOSAL | Status: AC
  Administered 2020-11-22: 15 mL via OROMUCOSAL
  Filled 2020-11-22: qty 15

## 2020-11-22 MED ORDER — CEFAZOLIN SODIUM-DEXTROSE 2-4 GM/100ML-% IV SOLN
2.0000 g | Freq: Four times a day (QID) | INTRAVENOUS | Status: DC
  Administered 2020-11-22: 2 g via INTRAVENOUS
  Filled 2020-11-22 (×3): qty 100

## 2020-11-22 MED ORDER — SODIUM CHLORIDE 0.9% FLUSH
INTRAVENOUS | Status: DC | PRN
  Administered 2020-11-22 (×2): 10 mL

## 2020-11-22 MED ORDER — TRANEXAMIC ACID 1000 MG/10ML IV SOLN
2000.0000 mg | Freq: Once | INTRAVENOUS | Status: DC
  Filled 2020-11-22: qty 20

## 2020-11-22 MED ORDER — ALUM & MAG HYDROXIDE-SIMETH 200-200-20 MG/5ML PO SUSP: 30.0000 mL | ORAL | Status: DC | PRN

## 2020-11-22 MED ORDER — SODIUM CHLORIDE 0.9 % IV SOLN: INTRAVENOUS | Status: DC

## 2020-11-22 MED ORDER — VANCOMYCIN HCL 1 G IV SOLR
INTRAVENOUS | Status: DC | PRN
  Administered 2020-11-22: 1000 mg via TOPICAL

## 2020-11-22 MED ORDER — PROPOFOL 10 MG/ML IV BOLUS
INTRAVENOUS | Status: DC | PRN
  Administered 2020-11-22: 60 mg via INTRAVENOUS

## 2020-11-22 MED ORDER — TRANEXAMIC ACID 1000 MG/10ML IV SOLN
INTRAVENOUS | Status: DC | PRN
  Administered 2020-11-22: 2000 mg via TOPICAL

## 2020-11-22 MED ORDER — 0.9 % SODIUM CHLORIDE (POUR BTL) OPTIME
TOPICAL | Status: DC | PRN
  Administered 2020-11-22: 1000 mL

## 2020-11-22 MED ORDER — LIDOCAINE HCL (PF) 2 % IJ SOLN
INTRAMUSCULAR | Status: AC
  Filled 2020-11-22: qty 10

## 2020-11-22 MED ORDER — LIDOCAINE 2% (20 MG/ML) 5 ML SYRINGE: INTRAMUSCULAR | Status: DC | PRN

## 2020-11-22 MED ORDER — BUPIVACAINE HCL (PF) 0.25 % IJ SOLN
INTRAMUSCULAR | Status: DC | PRN
  Administered 2020-11-22: 20 mL

## 2020-11-22 MED ORDER — PROPOFOL 10 MG/ML IV BOLUS
INTRAVENOUS | Status: AC
  Filled 2020-11-22: qty 20

## 2020-11-22 MED ORDER — ENOXAPARIN SODIUM 30 MG/0.3ML ~~LOC~~ SOLN
30.0000 mg | SUBCUTANEOUS | Status: DC
  Administered 2020-11-23 – 2020-11-28 (×6): 30 mg via SUBCUTANEOUS
  Filled 2020-11-22 (×6): qty 0.3

## 2020-11-22 MED ORDER — FENTANYL CITRATE (PF) 100 MCG/2ML IJ SOLN
25.0000 ug | INTRAMUSCULAR | Status: DC | PRN
  Administered 2020-11-22: 25 ug via INTRAVENOUS

## 2020-11-22 MED ORDER — SODIUM CHLORIDE 0.9 % IV SOLN
INTRAVENOUS | Status: DC | PRN
  Administered 2020-11-22: 20 mL

## 2020-11-22 MED ORDER — OXYCODONE HCL 5 MG/5ML PO SOLN: 5.0000 mg | Freq: Once | ORAL | Status: DC | PRN

## 2020-11-22 MED ORDER — SODIUM CHLORIDE 0.9 % IV SOLN
INTRAVENOUS | Status: DC | PRN
  Administered 2020-11-22: 250 mL via INTRAVENOUS

## 2020-11-22 MED ORDER — METHOCARBAMOL 1000 MG/10ML IJ SOLN
500.0000 mg | Freq: Four times a day (QID) | INTRAVENOUS | Status: DC | PRN
  Filled 2020-11-22: qty 5

## 2020-11-22 MED ORDER — PHENYLEPHRINE HCL-NACL 10-0.9 MG/250ML-% IV SOLN
INTRAVENOUS | Status: DC | PRN
  Administered 2020-11-22: 75 ug/min via INTRAVENOUS

## 2020-11-22 MED ORDER — ACETAMINOPHEN 325 MG PO TABS
325.0000 mg | ORAL_TABLET | Freq: Four times a day (QID) | ORAL | Status: DC | PRN
  Administered 2020-11-27: 650 mg via ORAL
  Filled 2020-11-22: qty 2

## 2020-11-22 MED ORDER — ROCURONIUM BROMIDE 10 MG/ML (PF) SYRINGE
PREFILLED_SYRINGE | INTRAVENOUS | Status: DC | PRN
  Administered 2020-11-22: 40 mg via INTRAVENOUS

## 2020-11-22 MED ORDER — DOCUSATE SODIUM 100 MG PO CAPS
100.0000 mg | ORAL_CAPSULE | Freq: Two times a day (BID) | ORAL | Status: DC
  Administered 2020-11-22 – 2020-11-27 (×11): 100 mg via ORAL
  Filled 2020-11-22 (×10): qty 1

## 2020-11-22 MED ORDER — ACETAMINOPHEN 500 MG PO TABS
1000.0000 mg | ORAL_TABLET | Freq: Four times a day (QID) | ORAL | Status: AC
  Administered 2020-11-23 (×2): 1000 mg via ORAL
  Filled 2020-11-22 (×2): qty 2

## 2020-11-22 MED ORDER — OXYCODONE HCL 5 MG PO TABS: 5.0000 mg | ORAL_TABLET | Freq: Once | ORAL | Status: DC | PRN

## 2020-11-22 MED ORDER — CHLORHEXIDINE GLUCONATE 0.12 % MT SOLN: 15.0000 mL | Freq: Once | OROMUCOSAL | Status: AC

## 2020-11-22 MED ORDER — ONDANSETRON HCL 4 MG/2ML IJ SOLN: 4.0000 mg | Freq: Four times a day (QID) | INTRAMUSCULAR | Status: DC | PRN

## 2020-11-22 MED ORDER — MENTHOL 3 MG MT LOZG: 1.0000 | LOZENGE | OROMUCOSAL | Status: DC | PRN

## 2020-11-22 MED ORDER — VANCOMYCIN HCL 1000 MG IV SOLR
INTRAVENOUS | Status: AC
  Filled 2020-11-22: qty 1000

## 2020-11-22 MED ORDER — ONDANSETRON HCL 4 MG/2ML IJ SOLN
INTRAMUSCULAR | Status: AC
  Filled 2020-11-22: qty 2

## 2020-11-22 MED ORDER — DEXAMETHASONE SODIUM PHOSPHATE 10 MG/ML IJ SOLN
INTRAMUSCULAR | Status: DC | PRN
  Administered 2020-11-22: 8 mg via INTRAVENOUS

## 2020-11-22 MED ORDER — PHENYLEPHRINE 40 MCG/ML (10ML) SYRINGE FOR IV PUSH (FOR BLOOD PRESSURE SUPPORT)
PREFILLED_SYRINGE | INTRAVENOUS | Status: DC | PRN
  Administered 2020-11-22 (×3): 80 ug via INTRAVENOUS

## 2020-11-22 MED ORDER — PHENOL 1.4 % MT LIQD: 1.0000 | OROMUCOSAL | Status: DC | PRN

## 2020-11-22 MED ORDER — IRRISEPT - 450ML BOTTLE WITH 0.05% CHG IN STERILE WATER, USP 99.95% OPTIME
TOPICAL | Status: DC | PRN
  Administered 2020-11-22: 450 mL via TOPICAL

## 2020-11-22 MED ORDER — SODIUM CHLORIDE 0.9 % IR SOLN
Status: DC | PRN
  Administered 2020-11-22: 3000 mL

## 2020-11-22 MED ORDER — SUGAMMADEX SODIUM 200 MG/2ML IV SOLN
INTRAVENOUS | Status: DC | PRN
  Administered 2020-11-22: 200 mg via INTRAVENOUS

## 2020-11-22 MED ORDER — BUPIVACAINE HCL (PF) 0.25 % IJ SOLN
INTRAMUSCULAR | Status: AC
  Filled 2020-11-22: qty 30

## 2020-11-22 MED ORDER — FENTANYL CITRATE (PF) 100 MCG/2ML IJ SOLN
INTRAMUSCULAR | Status: AC
  Administered 2020-11-22: 25 ug via INTRAVENOUS
  Filled 2020-11-22: qty 2

## 2020-11-22 MED ORDER — ONDANSETRON HCL 4 MG PO TABS: 4.0000 mg | ORAL_TABLET | Freq: Four times a day (QID) | ORAL | Status: DC | PRN

## 2020-11-22 MED ORDER — POLYETHYLENE GLYCOL 3350 17 G PO PACK: 17.0000 g | PACK | Freq: Every day | ORAL | Status: DC | PRN

## 2020-11-22 MED ORDER — ENOXAPARIN SODIUM 30 MG/0.3ML ~~LOC~~ SOLN: 30.0000 mg | SUBCUTANEOUS | 0 refills | Status: DC

## 2020-11-22 MED ORDER — FENTANYL CITRATE (PF) 250 MCG/5ML IJ SOLN
INTRAMUSCULAR | Status: DC | PRN
  Administered 2020-11-22: 75 ug via INTRAVENOUS
  Administered 2020-11-22: 25 ug via INTRAVENOUS
  Administered 2020-11-22: 50 ug via INTRAVENOUS

## 2020-11-22 MED ORDER — DEXAMETHASONE SODIUM PHOSPHATE 10 MG/ML IJ SOLN
INTRAMUSCULAR | Status: AC
  Filled 2020-11-22: qty 1

## 2020-11-22 MED ORDER — OXYCODONE HCL 5 MG PO TABS
5.0000 mg | ORAL_TABLET | ORAL | Status: DC | PRN
  Administered 2020-11-24 – 2020-11-26 (×2): 5 mg via ORAL
  Filled 2020-11-22 (×4): qty 1

## 2020-11-22 SURGICAL SUPPLY — 66 items
ADH SKN CLS LQ APL DERMABOND (GAUZE/BANDAGES/DRESSINGS) ×1
BAG DECANTER FOR FLEXI CONT (MISCELLANEOUS) ×3 IMPLANT
BALL HIP ARTICU 28 +5 (Hips) IMPLANT
BIPOLAR PROS AML 44 (Hips) ×2 IMPLANT
BIPOLAR PROS AML 44MM (Hips) ×1 IMPLANT
CELLS DAT CNTRL 66122 CELL SVR (MISCELLANEOUS) IMPLANT
COVER PERINEAL POST (MISCELLANEOUS) ×3 IMPLANT
COVER SURGICAL LIGHT HANDLE (MISCELLANEOUS) ×3 IMPLANT
COVER WAND RF STERILE (DRAPES) ×3 IMPLANT
DERMABOND ADHESIVE PROPEN (GAUZE/BANDAGES/DRESSINGS) ×2
DERMABOND ADVANCED .7 DNX6 (GAUZE/BANDAGES/DRESSINGS) IMPLANT
DRAPE C-ARM 42X72 X-RAY (DRAPES) ×3 IMPLANT
DRAPE POUCH INSTRU U-SHP 10X18 (DRAPES) ×3 IMPLANT
DRAPE STERI IOBAN 125X83 (DRAPES) ×3 IMPLANT
DRAPE U-SHAPE 47X51 STRL (DRAPES) ×6 IMPLANT
DRSG AQUACEL AG ADV 3.5X10 (GAUZE/BANDAGES/DRESSINGS) ×3 IMPLANT
DURAPREP 26ML APPLICATOR (WOUND CARE) ×6 IMPLANT
ELECT BLADE 4.0 EZ CLEAN MEGAD (MISCELLANEOUS) ×3
ELECT REM PT RETURN 9FT ADLT (ELECTROSURGICAL) ×3
ELECTRODE BLDE 4.0 EZ CLN MEGD (MISCELLANEOUS) ×1 IMPLANT
ELECTRODE REM PT RTRN 9FT ADLT (ELECTROSURGICAL) ×1 IMPLANT
GLOVE BIOGEL PI IND STRL 7.0 (GLOVE) ×1 IMPLANT
GLOVE BIOGEL PI INDICATOR 7.0 (GLOVE) ×2
GLOVE ECLIPSE 7.0 STRL STRAW (GLOVE) ×6 IMPLANT
GLOVE SKINSENSE NS SZ7.5 (GLOVE) ×2
GLOVE SKINSENSE STRL SZ7.5 (GLOVE) ×1 IMPLANT
GLOVE SURG SYN 7.5  E (GLOVE) ×12
GLOVE SURG SYN 7.5 E (GLOVE) ×4 IMPLANT
GLOVE SURG SYN 7.5 PF PI (GLOVE) ×4 IMPLANT
GOWN STRL REIN XL XLG (GOWN DISPOSABLE) ×3 IMPLANT
GOWN STRL REUS W/ TWL LRG LVL3 (GOWN DISPOSABLE) IMPLANT
GOWN STRL REUS W/ TWL XL LVL3 (GOWN DISPOSABLE) ×1 IMPLANT
GOWN STRL REUS W/TWL LRG LVL3 (GOWN DISPOSABLE)
GOWN STRL REUS W/TWL XL LVL3 (GOWN DISPOSABLE) ×3
HANDPIECE INTERPULSE COAX TIP (DISPOSABLE) ×3
HEAD BIPOLAR PROS AML 44 (Hips) IMPLANT
HIP BALL ARTICU 28 +5 (Hips) ×3 IMPLANT
HOOD PEEL AWAY FLYTE STAYCOOL (MISCELLANEOUS) ×6 IMPLANT
IV NS IRRIG 3000ML ARTHROMATIC (IV SOLUTION) ×3 IMPLANT
JET LAVAGE IRRISEPT WOUND (IRRIGATION / IRRIGATOR) ×3
KIT BASIN OR (CUSTOM PROCEDURE TRAY) ×3 IMPLANT
LAVAGE JET IRRISEPT WOUND (IRRIGATION / IRRIGATOR) ×1 IMPLANT
MARKER SKIN DUAL TIP RULER LAB (MISCELLANEOUS) ×3 IMPLANT
NDL SPNL 18GX3.5 QUINCKE PK (NEEDLE) ×1 IMPLANT
NEEDLE SPNL 18GX3.5 QUINCKE PK (NEEDLE) ×3 IMPLANT
PACK TOTAL JOINT (CUSTOM PROCEDURE TRAY) ×3 IMPLANT
PACK UNIVERSAL I (CUSTOM PROCEDURE TRAY) ×3 IMPLANT
RETRACTOR WND ALEXIS 18 MED (MISCELLANEOUS) IMPLANT
RTRCTR WOUND ALEXIS 18CM MED (MISCELLANEOUS)
SAW OSC TIP CART 19.5X105X1.3 (SAW) ×3 IMPLANT
SET HNDPC FAN SPRY TIP SCT (DISPOSABLE) ×1 IMPLANT
STAPLER VISISTAT 35W (STAPLE) IMPLANT
STEM FEM SZ3 STD ACTIS (Stem) ×2 IMPLANT
SUT ETHIBOND 2 V 37 (SUTURE) ×3 IMPLANT
SUT VIC AB 0 CT1 27 (SUTURE) ×3
SUT VIC AB 0 CT1 27XBRD ANBCTR (SUTURE) ×1 IMPLANT
SUT VIC AB 1 CTX 36 (SUTURE) ×3
SUT VIC AB 1 CTX36XBRD ANBCTR (SUTURE) ×1 IMPLANT
SUT VIC AB 2-0 CT1 27 (SUTURE) ×6
SUT VIC AB 2-0 CT1 TAPERPNT 27 (SUTURE) ×2 IMPLANT
SYR 50ML LL SCALE MARK (SYRINGE) ×3 IMPLANT
TOWEL GREEN STERILE (TOWEL DISPOSABLE) ×3 IMPLANT
TRAY CATH 16FR W/PLASTIC CATH (SET/KITS/TRAYS/PACK) IMPLANT
TRAY FOLEY W/BAG SLVR 16FR (SET/KITS/TRAYS/PACK) ×3
TRAY FOLEY W/BAG SLVR 16FR ST (SET/KITS/TRAYS/PACK) ×1 IMPLANT
YANKAUER SUCT BULB TIP NO VENT (SUCTIONS) ×3 IMPLANT

## 2020-11-22 NOTE — Op Note (Addendum)
ANTERIOR APPROACH RIGHT HIP HEMIARTHROPLASTY  Procedure Note Cassandra Dyer   [id number]  Pre-op Diagnosis: right femoral neck pathologic fracture     Post-op Diagnosis: same   Operative Procedures  1.  Radical resection of right femoral head and neck lesion from metastatic lung cancer 2.  Right hip hemiarthroplasty  Personnel  Surgeon(s): Leandrew Koyanagi, MD  ASSIST: Ky Barban, RNFA   Anesthesia: general, exparel local  Prosthesis: Depuy Femur: Actis 3 STD Head: 44 mm size: +5 Bearing Type: bipolar  After informed consent was obtained and the operative extremity marked in the holding area, the patient was brought back to the operating room and placed supine on the HANA table. Next, the operative extremity was prepped and draped in normal sterile fashion. Surgical timeout occurred verifying patient identification, surgical site, surgical procedure and administration of antibiotics.  A modified anterior Smith-Peterson approach to the hip was performed, using the interval between tensor fascia lata and sartorius.  Dissection was carried bluntly down onto the anterior hip capsule. The lateral femoral circumflex vessels were identified and coagulated. A capsulotomy was performed and the capsular flaps tagged for later repair.  Radical resection of the right femoral head and neck was performed sharply with bovie.  Osteotomy of the femoral neck was performed distal to the lesion which left enough calcar for placement of a femoral stem.  The head was found to be 44 mm and a trial 44 mm head was placed in the acetabulum and gave appropriate fit.  The specimen was sent to pathology.  We then turned our attention to the femur.  After placing the femoral hook, the leg was taken to externally rotated, extended and adducted position taking care to perform soft tissue releases to allow for adequate mobilization of the femur. Soft tissue was cleared from the shoulder of the greater trochanter and the  hook elevator used to improve exposure of the proximal femur. Sequential broaching performed up to a size 3. Trial neck and head were placed. The leg was brought back up to neutral and the construct reduced. The position and sizing of components, offset and leg lengths were checked using fluoroscopy. Stability of the construct was checked in extension and external rotation without any subluxation or impingement of prosthesis. We dislocated the prosthesis, dropped the leg back into position, removed trial components, and irrigated copiously. The final stem and head was then placed, the leg brought back up, the system reduced and fluoroscopy used to verify positioning.  We irrigated, obtained hemostasis and closed the capsule using #2 ethibond suture.  The fascia was closed with #1 vicryl plus, the deep fat layer was closed with 0 vicryl, the subcutaneous layers closed with 2.0 Vicryl Plus and the skin closed with 2.0 nylon and dermabond. A sterile dressing was applied. The patient was awakened in the operating room and taken to recovery in stable condition. All sponge, needle, and instrument counts were correct at the end of the case.   Position: supine  Complications: see description of procedure.  Time Out: performed   Drains/Packing: none  Estimated blood loss: see anesthesia record  Returned to Recovery Room: in good condition.   Antibiotics: yes   Mechanical VTE (DVT) Prophylaxis: sequential compression devices, TED thigh-high  Chemical VTE (DVT) Prophylaxis: lovenox  Fluid Replacement: Crystalloid: see anesthesia record  Specimens Removed: 1 to pathology   Sponge and Instrument Count Correct? yes   PACU: portable radiograph - low AP   Admission: inpatient status, start PT &  OT POD#1  Plan/RTC: Return in 2 weeks for staple removal. Return in 6 weeks to see MD.  Weight Bearing/Load Lower Extremity: full  Hip precautions: none  N. Eduard Roux, MD [facility] 2:44  PM   Implant Name Type Inv. Item Serial No. Manufacturer Lot No. LRB No. Used Action  BIPOLAR PROS AML 44MM - [id number] Hips BIPOLAR PROS AML 44MM  [employer] [id number] Right 1 Implanted  HIP BALL ARTICU 28 +5 - [id number] Hips HIP BALL ARTICU 28 +5  [employer]PU[employer] [id number] Right 1 Implanted  STEM FEM SZ3 STD ACTIS - [id number] Stem STEM FEM SZ3 STD ACTIS  [employer] [id number] Right 1 Implanted

## 2020-11-22 NOTE — Discharge Instructions (Signed)
° ° °  1. Change dressings as needed °2. May shower but keep incisions covered and dry °3. Take lovenox to prevent blood clots °4. Take stool softeners as needed °5. Take pain meds as needed ° °

## 2020-11-22 NOTE — Anesthesia Procedure Notes (Signed)
Procedure Name: Intubation Date/Time: 11/22/2020 1:51 PM Performed by: Myna Bright, CRNA Pre-anesthesia Checklist: Emergency Drugs available, Suction available, Patient being monitored and Patient identified Patient Re-evaluated:Patient Re-evaluated prior to induction Oxygen Delivery Method: Circle system utilized Preoxygenation: Pre-oxygenation with 100% oxygen Induction Type: IV induction Ventilation: Mask ventilation without difficulty Laryngoscope Size: Mac and 3 Grade View: Grade I Tube type: Oral Tube size: 7.0 mm Number of attempts: 1 Airway Equipment and Method: Stylet Placement Confirmation: ETT inserted through vocal cords under direct vision,  positive ETCO2 and breath sounds checked- equal and bilateral Secured at: 20 cm Tube secured with: Tape Dental Injury: Teeth and Oropharynx as per pre-operative assessment

## 2020-11-22 NOTE — OR Nursing (Signed)
Pt is awake,alert and oriented.Pt and/or family verbalized understanding of poc and discharge instructions. Reviewed admission and on going care with receiving RN. Pt is in NAD at this time and is ready to be transferred to floor. Will con't to monitor until pt is transferred. Belongings on bed with patient Pt on 02 2l/min [address] Pt on Monitor SR noted

## 2020-11-22 NOTE — Progress Notes (Signed)
PROGRESS NOTE  CHESNEY SUARES POE:[id number] DOB: [birth year] DOA: 11/20/2020 PCP: Biagio Borg, MD   LOS: 2 days   Brief narrative: As per HPI,  Cassandra Dyer is a 79 y.o. female with medical history significant of  hypertension, hyperlipidemia, recent diagnosis of lung cancer; remote h/o renal cell carcinoma; COPD on 2L home O2; and ESRD on HD presented to hospital with generalized  weakness, missed HD.   She also complained of right hip pain and ambulatory dysfunction.  She was unable to go to hemodialysis because of pain.  She also complained of generalized weakness, poor appetite and weight loss.  Of note, patient was previously hospitalized from 11/8-24 after missed HD x 1.  She presented with acute on chronic hypoxic respiratory failure and underwent thoracentesis x 3.  2D Echo was performed with grade 2 diastolic dysfunction.  She was found to have a L hilar mass and bronchoscopy with endobronchial [place] was performed.  She was subsequently diagnosed with stage IV (T3, N2, M1a) NSCLC.  MRI brain and PET scan were ordered.   She was seen by Rad Onc on 12/2 with the plan for palliative radiation.  She was seen again by the oncology PA on 12/6 with c/o SOB and R hip pain.  CXR showed stable pleural effusions and R hip film with minimally displaced R femoral neck fracture concerning for pathologic fracture.  She was told to stay off the hip and has been in a wheelchair since.    In the ED, patient was noted to be mildly hyperkalemic.  Nephrology was consulted for hemodialysis.  She had missed hemodialysis on Monday and Wednesday.  Patient was then admitted hospital for further evaluation and treatment.  Orthopedics was also consulted for possible surgical intervention.   Assessment/Plan:  Principal Problem:   Acute right hip pain Active Problems:   Essential hypertension   Dyslipidemia   Hyperkalemia, diminished renal excretion   COPD (chronic obstructive pulmonary disease) (HCC)    Adenocarcinoma of left lung, stage 4 (HCC)   Noncompliance of patient with renal dialysis ([place])  Acute right hip pain with pathological femoral neck fracture. Orthopedics on board.  MRI of the hip showed impacted subcapital right femoral neck pathological fracture.  Spoke with orthopedics yesterday.  Plan for surgical intervention today.  ESRD on hemodialysis, missed hemodialysis  Nephrology on board for hemodialysis, had mild hyperkalemia on presentation.  Had missed 2 sessions of hemodialysis as outpatient.  Continue hemodialysis as per nephrology.  Hyperkalemia -Has improved at this time.  Stage 4 lung cancer, recent diagnosis. Patient is supposed to start radiation soon.  MRI of the brain without contrast with 6 metastatic lesions.  Chest x-ray done showed cardiomegaly with central vascular congestion and new confluent patchy bilateral opacities.  Respiratory panel was negative for Covid and influenza.  Added Dr. Inda Merlin on the epic chat  Essential hypertension Continue Coreg, Micardis.  Discontinued Toprol-XL.  IV hydralazine as needed hypertension.  Blood pressure 113/57 at this time.  Hyperlipidemia -Continue Crestor  COPD with chronic hypoxic respiratory failure. On 2 L of oxygen by nasal cannula.  Continue bronchodilators.  Significant leukocytosis.  Possible reactive in nature.  Uncertain etiology.  No fever.  Patient has had WBC elevation for more than 2 weeks now.  Latest WBC of 22.9.  T-max of 99.5.  DVT prophylaxis: SCDs Start: 11/20/20 1624, heparin subcu  Code Status: DNR  Family Communication:  I again tried to reach the patient's friend listed in the computer (Mr  Butler [place]) but was unable to reach him despite the ring tone for several minutes.  Status is: Inpatient  Remains inpatient appropriate because: Unsafe d/c plan, IV treatments appropriate due to intensity of illness or inability to take PO and Inpatient level of care appropriate due to severity of  illness, need for surgical intervention, likely need for SNF   Dispo: The patient is from: Home              Anticipated d/c is to: Undetermined at this time.  Might need skilled nursing facility placement.              Anticipated d/c date is: 2 days              Patient currently is not medically stable to d/c.  Consultants: Nephrology;  Orthopedics;  nutrition Notified oncology team  Procedures:  Hemodialysis  Antibiotics:  . None  Anti-infectives (From admission, onward)   Start     Dose/Rate Route Frequency Ordered Stop   11/21/20 1730  ceFAZolin (ANCEF) IVPB 2g/100 mL premix       Note to Pharmacy: Anesthesia to give preop   2 g 200 mL/hr over 30 Minutes Intravenous  Once 11/21/20 1633       Subjective: Today, patient was seen and examined at bedside.  Complains of left hip pain on moving, generalized weakness but denies any chest pain, shortness of breath or fever  Objective: Vitals:   11/22/20 0221 11/22/20 0435  BP: (!) 99/49 (!) 113/57  Pulse: 84 85  Resp: 16 18  Temp: 99.5 F (37.5 C) 98.1 F (36.7 C)  SpO2: 97% 100%    Intake/Output Summary (Last 24 hours) at 11/22/2020 0739 Last data filed at 11/22/2020 0200 Gross per 24 hour  Intake 0 ml  Output 600 ml  Net -600 ml   Filed Weights   11/20/20 2040 11/21/20 0024 11/22/20 0435  Weight: 50.9 kg 49.3 kg 49.4 kg   Body mass index is 19.92 kg/m.   Physical Exam:  GENERAL: Patient is alert awake and communicative, not in obvious distress.  Thinly built, on nasal cannula oxygen at 2 L/min HENT: No scleral pallor or icterus. Pupils equally reactive to light. Oral mucosa is moist.  Right internal jugular hemodialysis catheter NECK: is supple, no gross swelling noted. CHEST: Decreased breath sounds bilaterally. CVS: S1 and S2 heard, no murmur. Regular rate and rhythm.  ABDOMEN: Soft, non-tender, bowel sounds are present. EXTREMITIES: Right hip with tenderness on palpation, CNS: Cranial nerves are  intact. No focal motor deficits. SKIN: warm and dry without rashes.  Data Review: I have personally reviewed the following laboratory data and studies,  CBC: Recent Labs  Lab 11/18/20 1338 11/20/20 1240 11/21/20 0117 11/22/20 0249  WBC 33.8* 34.8* 30.5* 22.9*  NEUTROABS 30.5*  --   --   --   HGB 10.3* 10.6* 10.0* 8.3*  HCT 34.4* 33.1* 32.1* 27.2*  MCV 88.7 89.0 87.2 89.5  PLT 412* 448* 367 568   Basic Metabolic Panel: Recent Labs  Lab 11/18/20 1338 11/20/20 1240 11/21/20 0117 11/22/20 0249  NA 138 134* 136 134*  K 5.5* 6.8* 3.6 4.3  CL 94* 91* 95* 96*  CO2 32 25 24 26   GLUCOSE 140* 83 94 107*  BUN 40* 63* 15 5*  CREATININE 7.90* 10.96* 3.93* 1.82*  CALCIUM 10.5* 9.8 7.9* 7.7*  MG  --   --   --  1.8   Liver Function Tests: Recent Labs  Lab 11/18/20  1338 11/20/20 1240 11/22/20 0249  AST 25 34 29  ALT 18 16 12   ALKPHOS 163* 175* 130*  BILITOT 0.5 0.9 0.7  PROT 6.9 6.5 5.0*  ALBUMIN 2.1* 2.2* 1.6*   No results for input(s): LIPASE, AMYLASE in the last 168 hours. No results for input(s): AMMONIA in the last 168 hours. Cardiac Enzymes: No results for input(s): CKTOTAL, CKMB, CKMBINDEX, TROPONINI in the last 168 hours. BNP (last 3 results) No results for input(s): BNP in the last 8760 hours.  ProBNP (last 3 results) No results for input(s): PROBNP in the last 8760 hours.  CBG: Recent Labs  Lab 11/20/20 1746  GLUCAP 141*   Recent Results (from the past 240 hour(s))  Resp Panel by RT-PCR (Flu A&B, Covid) Nasopharyngeal Swab     Status: None   Collection Time: 11/20/20  3:59 PM   Specimen: Nasopharyngeal Swab; Nasopharyngeal(NP) swabs in vial transport medium  Result Value Ref Range Status   SARS Coronavirus 2 by RT PCR NEGATIVE NEGATIVE Final    Comment: (NOTE) SARS-CoV-2 target nucleic acids are NOT DETECTED.  The SARS-CoV-2 RNA is generally detectable in upper respiratory specimens during the acute phase of infection. The lowest concentration of  SARS-CoV-2 viral copies this assay can detect is 138 copies/mL. A negative result does not preclude SARS-Cov-2 infection and should not be used as the sole basis for treatment or other patient management decisions. A negative result may occur with  improper specimen collection/handling, submission of specimen other than nasopharyngeal swab, presence of viral mutation(s) within the areas targeted by this assay, and inadequate number of viral copies(<138 copies/mL). A negative result must be combined with clinical observations, patient history, and epidemiological information. The expected result is Negative.  Fact Sheet for Patients:  EntrepreneurPulse.com.au  Fact Sheet for Healthcare Providers:  IncredibleEmployment.be  This test is no t yet approved or cleared by the [place] FDA and  has been authorized for detection and/or diagnosis of SARS-CoV-2 by FDA under an Emergency Use Authorization (EUA). This EUA will remain  in effect (meaning this test can be used) for the duration of the COVID-19 declaration under Section 564(b)(1) of the Act, 21 U.S.C.section 360bbb-3(b)(1), unless the authorization is terminated  or revoked sooner.       Influenza A by PCR NEGATIVE NEGATIVE Final   Influenza B by PCR NEGATIVE NEGATIVE Final    Comment: (NOTE) The Xpert Xpress SARS-CoV-2/FLU/RSV plus assay is intended as an aid in the diagnosis of influenza from Nasopharyngeal swab specimens and should not be used as a sole basis for treatment. Nasal washings and aspirates are unacceptable for Xpert Xpress SARS-CoV-2/FLU/RSV testing.  Fact Sheet for Patients: EntrepreneurPulse.com.au  Fact Sheet for Healthcare Providers: IncredibleEmployment.be  This test is not yet approved or cleared by the [place] FDA and has been authorized for detection and/or diagnosis of SARS-CoV-2 by FDA under an Emergency Use  Authorization (EUA). This EUA will remain in effect (meaning this test can be used) for the duration of the COVID-19 declaration under Section 564(b)(1) of the Act, 21 U.S.C. section 360bbb-3(b)(1), unless the authorization is terminated or revoked.  Performed at [facility], [place] [address] [place], [place] [address]   Surgical pcr screen     Status: None   Collection Time: 11/21/20  8:22 PM   Specimen: Nasal Mucosa; Nasal Swab  Result Value Ref Range Status   MRSA, PCR NEGATIVE NEGATIVE Final   Staphylococcus aureus NEGATIVE NEGATIVE Final    Comment: (NOTE) The  Xpert SA Assay (FDA approved for NASAL specimens in patients 65 years of age and older), is one component of a comprehensive surveillance program. It is not intended to diagnose infection nor to guide or monitor treatment. Performed at [facility], [place] [address], [place], [place] [address]      Studies: DG Chest 2 View  Result Date: 11/20/2020 CLINICAL DATA:  cp EXAM: CHEST - 2 VIEW COMPARISON:  11/18/2020 FINDINGS: Tunneled right IJ CVC tip overlies the superior cavoatrial junction. Cardiomegaly with central pulmonary vascular congestion. Redemonstration of small bilateral pleural effusions. New patchy bilateral pulmonary opacities. Multilevel spondylosis. IMPRESSION: 1. Cardiomegaly with central pulmonary vascular congestion. Redemonstration of small bilateral pleural effusions. 2. New confluent/patchy bilateral pulmonary opacities. Differential includes edema versus infection. Electronically Signed   By: Primitivo Gauze M.D.   On: 11/20/2020 13:29   MR BRAIN WO CONTRAST  Result Date: 11/21/2020 CLINICAL DATA:  Metastatic lung cancer, history of renal cell carcinoma EXAM: MRI HEAD WITHOUT CONTRAST TECHNIQUE: Multiplanar, multiecho pulse sequences of the brain and surrounding structures were obtained without intravenous contrast. COMPARISON:  None. FINDINGS: Brain: Suboptimal evaluation for metastatic  disease without intravenous contrast. Several lesions are identified and annotated primarily on axial T2 series 6: 1.4 x 0.8 cm lesion of the left postcentral gyrus with moderate surrounding edema on series 6, image 20; 0.5 cm lesion of the left superior frontal gyrus on series 6, image 22; 0.7 cm lesion of the left superior frontal gyrus more inferiorly with minimal edema on series 6, image 18; 2.6 x 1.7 cm lesion of the right lateral ventricle along the septum pellucidum on series 6, image 15; 0.5 cm lesion of the parasagittal left occipital lobe on series 6, image 13 with corresponding susceptibility reflecting chronic blood products or mineralization; 1 cm lesion of lateral left cerebellum with mild surrounding edema on series 7, image 6; Question of a small area of edema versus gliosis involving the lateral left precentral gyrus on series 7, image 16 without definite lesion. There is no acute infarction or intracranial hemorrhage. There is no hydrocephalus or extra-axial fluid collection. Prominence of the ventricles and sulci reflects minor generalized parenchymal volume loss. Vascular: Major vessel flow voids at the skull base are preserved. Skull and upper cervical spine: Normal marrow signal is preserved. Sinuses/Orbits: Trace mucosal thickening. Bilateral lens replacements. Other: Sella is unremarkable.  Mastoid air cells are clear. IMPRESSION: Six intracranial lesions are identified likely reflecting metastases. There is edema associated with the larger lesions but no significant mass effect. Question of a small area of edema versus gliosis in the posterior left frontal lobe without definite underlying lesion. Electronically Signed   By: Macy Mis M.D.   On: 11/21/2020 13:04   MR FEMUR RIGHT W WO CONTRAST  Result Date: 11/20/2020 CLINICAL DATA:  Fracture with metastatic disease EXAM: MRI OF THE RIGHT FEMUR WITHOUT AND WITH CONTRAST TECHNIQUE: Multiplanar, multisequence MR imaging of the right was  performed both before and after administration of intravenous contrast. CONTRAST:  64mL GADAVIST GADOBUTROL 1 MMOL/ML IV SOLN COMPARISON:  None. FINDINGS: Bones/Joint/Cartilage There is a nondisplaced slightly impacted subcapital right femoral neck fracture as on the recent exam. There is a heterogeneous T1 isointense/heterogeneously T2 bright peripherally enhancing lesion within the medial right femoral neck. Mild surrounding marrow edema seen. No other fracture is identified. There is moderate bilateral hip osteoarthritis. Within the left femoral head/neck there is also a T1 hypointense/T2 heterogeneous lesion. A moderate right hip joint effusion is seen. Ligaments  The ligamentum teres appears to be grossly intact Muscles and Tendons There is edema seen within the adductor musculature and iliopsoas musculature. The tendons appear to be grossly intact. Soft tissues Soft tissue swelling seen along the lateral aspect of the hip. Multiple hypointense uterine fibroids are seen. There is cystic lesion seen within the left adnexa which is partially visualized. IMPRESSION: Nondisplaced slightly impacted subcapital right femoral neck pathologic fracture. There is a osseous lesion within the right femoral neck, likely metastatic lesion . Would recommend nuclear medicine bone scan for further evaluation other osseous lesions. Osseous lesion within the left femoral head/neck concerning for metastatic lesion. Moderate right hip osteoarthritis and joint effusion Electronically Signed   By: Prudencio Pair M.D.   On: 11/20/2020 20:18     Flora Lipps, MD  [facility] 11/22/2020

## 2020-11-22 NOTE — H&P (Signed)

## 2020-11-22 NOTE — Transfer of Care (Signed)
Immediate Anesthesia Transfer of Care Note  Patient: Cassandra Dyer  Procedure(s) Performed: ANTERIOR APPROACH RIGHT HIP HEMIARTHROPLASTY (Right )  Patient Location: PACU  Anesthesia Type:General  Level of Consciousness: drowsy and patient cooperative  Airway & Oxygen Therapy: Patient Spontanous Breathing and Patient connected to nasal cannula oxygen  Post-op Assessment: Report given to RN and Post -op Vital signs reviewed and stable  Post vital signs: Reviewed and stable  Last Vitals:  Vitals Value Taken Time  BP 88/51 11/22/20 1513  Temp    Pulse    Resp 18 11/22/20 1514  SpO2    Vitals shown include unvalidated device data.  Last Pain:  Vitals:   11/22/20 0828  TempSrc: Oral  PainSc: 0-No pain      Patients Stated Pain Goal: 0 (08/56/94 3700)  Complications: No complications documented.

## 2020-11-22 NOTE — Progress Notes (Signed)
   11/22/20 0122  Hand-Off documentation  Handoff Given Given to Transfer Unit/facility  Report given to (Full Name) Haskel Khan, RN  Handoff Received Received from shift RN/LPN  Report received from (Full Name) Tricha Ruggirello  Vitals  BP (!) 112/58  MAP (mmHg) 75  BP Location Right Arm  BP Method Automatic  Patient Position (if appropriate) Lying  Pulse Rate 79  Pulse Rate Source Monitor  ECG Heart Rate 79  Resp (!) 23  Oxygen Therapy  SpO2 100 %  O2 Device Nasal Cannula  O2 Flow Rate (L/min) 2 L/min  Pain Assessment  Pain Scale 0-10  Pain Score 0  Post-Hemodialysis Assessment  Rinseback Volume (mL) 250 mL  KECN 270 V  Dialyzer Clearance Lightly streaked  Duration of HD Treatment -hour(s) 3.5 hour(s)  Hemodialysis Intake (mL) 500 mL  UF Total -Machine (mL) 1100 mL  Net UF (mL) 600 mL  Tolerated HD Treatment Yes  Post-Hemodialysis Comments tx achieved as expeted.  bp has been in low 326'Z systolic,  BFR reajusted to 350 and uf goal to 1100. pt has slept for the entire tx and stable.  AVG/AVF Arterial Site Held (minutes) 0 minutes  AVG/AVF Venous Site Held (minutes) 0 minutes  Fistula / Graft Left Upper arm Arteriovenous fistula  Placement Date/Time: 08/07/20 1206   Placed prior to admission: No  Orientation: Left  Access Location: Upper arm  Access Type: Arteriovenous fistula  Site Condition No complications  Fistula / Graft Assessment Present;Thrill;Bruit  Status Deaccessed  Needle Size N/A  Drainage Description None  Hemodialysis Catheter Right Internal jugular Double lumen Permanent (Tunneled)  Placement Date/Time: 07/27/20 1258   Time Out: Correct patient;Correct site;Correct procedure  Maximum sterile barrier precautions: Hand hygiene;Cap;Mask;Sterile gown;Sterile gloves;Large sterile sheet  Site Prep: Chlorhexidine (preferred)  Local Anes...  Site Condition No complications  Blue Lumen Status Heparin locked;Capped (Central line)  Red Lumen Status Heparin  locked;Capped (Central line)  Catheter fill solution Heparin 1000 units/ml  Catheter fill volume (Arterial) 1.6 cc  Catheter fill volume (Venous) 1.6  Dressing Type Gauze/Drain sponge  Dressing Status Clean;Intact;Dry  Antimicrobial disc in place? Yes  Drainage Description None  Dressing Change Due 11/27/20  Post treatment catheter status Capped and Clamped

## 2020-11-22 NOTE — Progress Notes (Addendum)
[facility] Progress Note   Subjective:   Patient seen and examined at bedside.  Waiting to go to the OR for surgery.  Denies CP, SOB, abdominal pain and n/v/d.  Reports pain is mostly well controlled as long as she doesn't move.    Objective Vitals:   11/22/20 0122 11/22/20 0221 11/22/20 0435 11/22/20 0828  BP: (!) 112/58 (!) 99/49 (!) 113/57 (!) 117/55  Pulse: 79 84 85 85  Resp: (!) 23 16 18 16   Temp:  99.5 F (37.5 C) 98.1 F (36.7 C) 98.5 F (36.9 C)  TempSrc:  Oral  Oral  SpO2: 100% 97% 100% 100%  Weight:   49.4 kg   Height:       Physical Exam General:WDWN female in NAD Heart:RRR, no MRG Lungs:CTAB, nml WOB on 2L via [place] Abdomen:soft, NTND Extremities:no LE edema Dialysis Access: LU AVF +b/t, [facility]  Filed Weights   11/20/20 2040 11/21/20 0024 11/22/20 0435  Weight: 50.9 kg 49.3 kg 49.4 kg    Intake/Output Summary (Last 24 hours) at 11/22/2020 1155 Last data filed at 11/22/2020 0200 Gross per 24 hour  Intake 0 ml  Output 600 ml  Net -600 ml    Additional Objective Labs: Basic Metabolic Panel: Recent Labs  Lab 11/20/20 1240 11/21/20 0117 11/22/20 0249  NA 134* 136 134*  K 6.8* 3.6 4.3  CL 91* 95* 96*  CO2 25 24 26   GLUCOSE 83 94 107*  BUN 63* 15 5*  CREATININE 10.96* 3.93* 1.82*  CALCIUM 9.8 7.9* 7.7*   Liver Function Tests: Recent Labs  Lab 11/18/20 1338 11/20/20 1240 11/22/20 0249  AST 25 34 29  ALT 18 16 12   ALKPHOS 163* 175* 130*  BILITOT 0.5 0.9 0.7  PROT 6.9 6.5 5.0*  ALBUMIN 2.1* 2.2* 1.6*   CBC: Recent Labs  Lab 11/18/20 1338 11/20/20 1240 11/21/20 0117 11/22/20 0249  WBC 33.8* 34.8* 30.5* 22.9*  NEUTROABS 30.5*  --   --   --   HGB 10.3* 10.6* 10.0* 8.3*  HCT 34.4* 33.1* 32.1* 27.2*  MCV 88.7 89.0 87.2 89.5  PLT 412* 448* 367 302   CBG: Recent Labs  Lab 11/20/20 1746  GLUCAP 141*   Studies/Results: DG Chest 2 View  Result Date: 11/20/2020 CLINICAL DATA:  cp EXAM: CHEST - 2 VIEW COMPARISON:   11/18/2020 FINDINGS: Tunneled right IJ CVC tip overlies the superior cavoatrial junction. Cardiomegaly with central pulmonary vascular congestion. Redemonstration of small bilateral pleural effusions. New patchy bilateral pulmonary opacities. Multilevel spondylosis. IMPRESSION: 1. Cardiomegaly with central pulmonary vascular congestion. Redemonstration of small bilateral pleural effusions. 2. New confluent/patchy bilateral pulmonary opacities. Differential includes edema versus infection. Electronically Signed   By: Primitivo Gauze M.D.   On: 11/20/2020 13:29   MR BRAIN WO CONTRAST  Result Date: 11/21/2020 CLINICAL DATA:  Metastatic lung cancer, history of renal cell carcinoma EXAM: MRI HEAD WITHOUT CONTRAST TECHNIQUE: Multiplanar, multiecho pulse sequences of the brain and surrounding structures were obtained without intravenous contrast. COMPARISON:  None. FINDINGS: Brain: Suboptimal evaluation for metastatic disease without intravenous contrast. Several lesions are identified and annotated primarily on axial T2 series 6: 1.4 x 0.8 cm lesion of the left postcentral gyrus with moderate surrounding edema on series 6, image 20; 0.5 cm lesion of the left superior frontal gyrus on series 6, image 22; 0.7 cm lesion of the left superior frontal gyrus more inferiorly with minimal edema on series 6, image 18; 2.6 x 1.7 cm lesion of the right lateral ventricle  along the septum pellucidum on series 6, image 15; 0.5 cm lesion of the parasagittal left occipital lobe on series 6, image 13 with corresponding susceptibility reflecting chronic blood products or mineralization; 1 cm lesion of lateral left cerebellum with mild surrounding edema on series 7, image 6; Question of a small area of edema versus gliosis involving the lateral left precentral gyrus on series 7, image 16 without definite lesion. There is no acute infarction or intracranial hemorrhage. There is no hydrocephalus or extra-axial fluid collection.  Prominence of the ventricles and sulci reflects minor generalized parenchymal volume loss. Vascular: Major vessel flow voids at the skull base are preserved. Skull and upper cervical spine: Normal marrow signal is preserved. Sinuses/Orbits: Trace mucosal thickening. Bilateral lens replacements. Other: Sella is unremarkable.  Mastoid air cells are clear. IMPRESSION: Six intracranial lesions are identified likely reflecting metastases. There is edema associated with the larger lesions but no significant mass effect. Question of a small area of edema versus gliosis in the posterior left frontal lobe without definite underlying lesion. Electronically Signed   By: Macy Mis M.D.   On: 11/21/2020 13:04   MR FEMUR RIGHT W WO CONTRAST  Result Date: 11/20/2020 CLINICAL DATA:  Fracture with metastatic disease EXAM: MRI OF THE RIGHT FEMUR WITHOUT AND WITH CONTRAST TECHNIQUE: Multiplanar, multisequence MR imaging of the right was performed both before and after administration of intravenous contrast. CONTRAST:  21mL GADAVIST GADOBUTROL 1 MMOL/ML IV SOLN COMPARISON:  None. FINDINGS: Bones/Joint/Cartilage There is a nondisplaced slightly impacted subcapital right femoral neck fracture as on the recent exam. There is a heterogeneous T1 isointense/heterogeneously T2 bright peripherally enhancing lesion within the medial right femoral neck. Mild surrounding marrow edema seen. No other fracture is identified. There is moderate bilateral hip osteoarthritis. Within the left femoral head/neck there is also a T1 hypointense/T2 heterogeneous lesion. A moderate right hip joint effusion is seen. Ligaments The ligamentum teres appears to be grossly intact Muscles and Tendons There is edema seen within the adductor musculature and iliopsoas musculature. The tendons appear to be grossly intact. Soft tissues Soft tissue swelling seen along the lateral aspect of the hip. Multiple hypointense uterine fibroids are seen. There is cystic  lesion seen within the left adnexa which is partially visualized. IMPRESSION: Nondisplaced slightly impacted subcapital right femoral neck pathologic fracture. There is a osseous lesion within the right femoral neck, likely metastatic lesion . Would recommend nuclear medicine bone scan for further evaluation other osseous lesions. Osseous lesion within the left femoral head/neck concerning for metastatic lesion. Moderate right hip osteoarthritis and joint effusion Electronically Signed   By: Prudencio Pair M.D.   On: 11/20/2020 20:18    Medications: .  ceFAZolin (ANCEF) IV    . [START ON 11/27/2020] iron sucrose    . methocarbamol (ROBAXIN) IV     . (feeding supplement) PROSource Plus  30 mL Oral BID BM  . amLODipine  10 mg Oral Daily  . carvedilol  12.5 mg Oral BID WC  . Chlorhexidine Gluconate Cloth  6 each Topical Daily  . docusate sodium  100 mg Oral BID  . feeding supplement (NEPRO CARB STEADY)  237 mL Oral BID BM  . heparin sodium (porcine)      . irbesartan  150 mg Oral Daily  . multivitamin  1 tablet Oral QHS  . rosuvastatin  10 mg Oral Daily    Dialysis Orders: [place] on MWF, 4 hours EDW 48.5 kg, [place] 2.0 potassium 2.0 calcium, 2000 units heparin. Venofer 50  mg, no ESA secondary to cancer, Access =left BC aVF placed 08/07/2020, right IJ PermCath  Assessment/Plan: 1. Hyperkalemia 2/2 missed dialysis & dietary indiscretions. Resolved post HD.  K 4.3 today.  2. Leukocytosis - WBC trending down, 22.9 today.  tmax 99.5 last 24 hours. Etiology unclear.  Wk up per admit.   3. ESRD - on HD MWF.  Off schedule yesterday due to plan for surgery today.  Plan to run again off schedule tomorrow and will resume regular schedule on Monday.  Plan to use AVF with HD tomorrow.  Was ready for use in Sept 2021. 4. Hypertension/volume - BP well controlled on amlodipine 10 mg daily, , Avapro 150 mg daily Coreg 12.5 mg twice daily.  Does not appear grossly volume overloaded. Close to EDW, UF as  tolerated.  5. Anemia -Hgb 8.3 today.  No ESA d/t malignancy. Continue weekly iron. Transfuse if Hgb<7. 6. Metabolic bone disease -  CCa and phos in goal.  Not on binders or VDRA.  Follow labs.  7. Right femoral neck fracture - MRI hip showed impacted subcapital R femoral neck frx.  Plan for surgery today. Per ortho.  8. Recent diagnosed lung cancer/Metastatic cancer=followed by Dr. Earlie Server oncology per patient has appointment December 14 to discuss. To start radiation soon. MRI brain yesterday showed 6 intracranial lesions likely reflecting metastases.  Edema associated with larger lesions but no significant mass effect.  Per admit.    Jen Mow, PA-C [facility] 11/22/2020,11:55 AM  LOS: 2 days   Nephrology attending S/p HD yesterday. Plan for ortho surgery today. Clinically stable,  Will schedule HD tomorrow.   Katheran James, MD [facility]

## 2020-11-22 NOTE — Progress Notes (Signed)
Patient reports no left hip pain even though she has a lytic lesion in her left femoral neck.  Based on our discussion of various treatment options, we agreed to palliative radiation of the left femoral neck lesion.  If she develops pain, we may need to reevaluate surgery.

## 2020-11-23 DIAGNOSIS — D72829 Elevated white blood cell count, unspecified: Secondary | ICD-10-CM

## 2020-11-23 DIAGNOSIS — N186 End stage renal disease: Secondary | ICD-10-CM

## 2020-11-23 LAB — BASIC METABOLIC PANEL
Anion gap: 14 (ref 5–15)
BUN: 20 mg/dL (ref 8–23)
CO2: 24 mmol/L (ref 22–32)
Calcium: 8.8 mg/dL — ABNORMAL LOW (ref 8.9–10.3)
Chloride: 95 mmol/L — ABNORMAL LOW (ref 98–111)
Creatinine, Ser: 3.91 mg/dL — ABNORMAL HIGH (ref 0.44–1.00)
GFR, Estimated: 11 mL/min — ABNORMAL LOW (ref >60)
Glucose, Bld: 120 mg/dL — ABNORMAL HIGH (ref 70–99)
Potassium: 6.1 mmol/L — ABNORMAL HIGH (ref 3.5–5.1)
Sodium: 133 mmol/L — ABNORMAL LOW (ref 135–145)

## 2020-11-23 LAB — CBC
HCT: 28 % — ABNORMAL LOW (ref 36.0–46.0)
Hemoglobin: 8.7 g/dL — ABNORMAL LOW (ref 12.0–15.0)
MCH: 27.5 pg (ref 26.0–34.0)
MCHC: 31.1 g/dL (ref 30.0–36.0)
MCV: 88.6 fL (ref 80.0–100.0)
Platelets: 324 10*3/uL (ref 150–400)
RBC: 3.16 MIL/uL — ABNORMAL LOW (ref 3.87–5.11)
RDW: 16 % — ABNORMAL HIGH (ref 11.5–15.5)
WBC: 25.6 10*3/uL — ABNORMAL HIGH (ref 4.0–10.5)
nRBC: 0 % (ref 0.0–0.2)

## 2020-11-23 LAB — MAGNESIUM: Magnesium: 2 mg/dL (ref 1.7–2.4)

## 2020-11-23 MED ORDER — CEFAZOLIN SODIUM-DEXTROSE 2-4 GM/100ML-% IV SOLN
2.0000 g | Freq: Four times a day (QID) | INTRAVENOUS | Status: DC
  Filled 2020-11-23: qty 100

## 2020-11-23 MED ORDER — HEPARIN SODIUM (PORCINE) 1000 UNIT/ML IJ SOLN
INTRAMUSCULAR | Status: AC
  Filled 2020-11-23: qty 3

## 2020-11-23 MED ORDER — ALUM & MAG HYDROXIDE-SIMETH 200-200-20 MG/5ML PO SUSP: 30.0000 mL | ORAL | Status: DC | PRN

## 2020-11-23 MED ORDER — DEXAMETHASONE SODIUM PHOSPHATE 10 MG/ML IJ SOLN
8.0000 mg | Freq: Four times a day (QID) | INTRAMUSCULAR | Status: AC
  Administered 2020-11-23 – 2020-11-25 (×8): 8 mg via INTRAVENOUS
  Filled 2020-11-23 (×8): qty 0.8

## 2020-11-23 NOTE — Progress Notes (Signed)
OT Cancellation Note  Patient Details Name: LORRENE GRAEF MRN: [id number] DOB: September 01, 1941   Cancelled Treatment:    Reason Eval/Treat Not Completed: Patient at procedure or test/ unavailable.  Pt in HD.  Will reattempt.  Nilsa Nutting., OTR/L Acute Rehabilitation Services Pager [phone] Office [phone]   Lucille Passy M 11/23/2020, 11:42 AM

## 2020-11-23 NOTE — Progress Notes (Addendum)
Subjective: Seen and examined in room no complaints for hemodialysis today  Objective Vital signs in last 24 hours: Vitals:   11/23/20 0104 11/23/20 0441 11/23/20 0851 11/23/20 0928  BP: (!) 107/56 (!) 103/49 (!) 187/157 112/65  Pulse: 80 75 78 84  Resp: 18 18    Temp: 98.2 F (36.8 C) 97.7 F (36.5 C) 98 F (36.7 C)   TempSrc: Oral  Oral   SpO2: 100% 100% 97%   Weight:  52.6 kg    Height:       Weight change: 0 kg  Physical Exam General: Thin chronically ill-appearing elderly female  in NAD Heart:RRR, no MRG Lungs:CTAB, nml WOB on 2L via [place] Abdomen:soft, NTND Extremities:no LE edema Dialysis Access: LU AVF +b/t, [facility]DC   Dialysis Orders: [place] on MWF,4 hoursEDW 48.5 kg, [place] 2.0 potassium 2.0 calcium, 2000 units heparin. Venofer 50 mg, no ESA secondary to cancer, Access =left BC aVF placed 08/07/2020, right IJ PermCath  Assessment/Plan: 1. Hyperkalemia 2/2 missed dialysis & dietary indiscretions. This a.m. 6.1 yesterday resolved post HD.  K 4.3 HD today    2. ESRD- on HD MWF.  Off schedule 12/10 due to plan hip surgery/HD today back on schedule Monday .  Plan to use AVF with HD tomorrow.  Was ready for use in Sept 2021. 3. Hypertension/volume - BP well controlled on amlodipine 10 mg daily, , Avapro 150 mg daily Coreg 12.5 mg twice daily.  Does not appear grossly volume overloaded. Close to EDW, UF as tolerated.  4. Anemia -Hgb 8.3> 8.7 today.  No ESA d/t malignancy. Continue weekly iron. Transfuse if Hgb<7. 5. Metabolic bone disease -  CCa 9.9 and phos in goal.  Not on binders or VDRA.  Follow labs.  6. Right femoral neck fracture - MRI hip showed impacted subcapital R femoral neck frx.   Status post 12/10 right hip hemiarthroplasty,plan  Per ortho.  7. Recent diagnosed lung cancer/Metastatic cancer=followed by Dr. Earlie Server oncology per patient has appointment December 14 to discuss. To start radiation soon. MRI brain 12/09` showed 6 intracranial lesions likely  reflecting metastases.  Edema associated with larger lesions but no significant mass effect.  Per admit. 8. Leukocytosis possible reactive in nature, T-max 99.5, work-up per admit    Ernest Haber, PA-C [facility] [phone] 11/23/2020,11:02 AM  LOS: 3 days   Labs: Basic Metabolic Panel: Recent Labs  Lab 11/21/20 0117 11/22/20 0249 11/23/20 0502  NA 136 134* 133*  K 3.6 4.3 6.1*  CL 95* 96* 95*  CO2 24 26 24   GLUCOSE 94 107* 120*  BUN 15 5* 20  CREATININE 3.93* 1.82* 3.91*  CALCIUM 7.9* 7.7* 8.8*   Liver Function Tests: Recent Labs  Lab 11/18/20 1338 11/20/20 1240 11/22/20 0249  AST 25 34 29  ALT 18 16 12   ALKPHOS 163* 175* 130*  BILITOT 0.5 0.9 0.7  PROT 6.9 6.5 5.0*  ALBUMIN 2.1* 2.2* 1.6*   No results for input(s): LIPASE, AMYLASE in the last 168 hours. No results for input(s): AMMONIA in the last 168 hours. CBC: Recent Labs  Lab 11/18/20 1338 11/18/20 1338 11/20/20 1240 11/21/20 0117 11/22/20 0249 11/23/20 0502  WBC 33.8*   < > 34.8* 30.5* 22.9* 25.6*  NEUTROABS 30.5*  --   --   --   --   --   HGB 10.3*   < > 10.6* 10.0* 8.3* 8.7*  HCT 34.4*  --  33.1* 32.1* 27.2* 28.0*  MCV 88.7  --  89.0  87.2 89.5 88.6  PLT 412*   < > 448* 367 302 324   < > = values in this interval not displayed.   Cardiac Enzymes: No results for input(s): CKTOTAL, CKMB, CKMBINDEX, TROPONINI in the last 168 hours. CBG: Recent Labs  Lab 11/20/20 1746 11/22/20 1519  GLUCAP 141* 101*    Studies/Results: MR BRAIN WO CONTRAST  Result Date: 11/21/2020 CLINICAL DATA:  Metastatic lung cancer, history of renal cell carcinoma EXAM: MRI HEAD WITHOUT CONTRAST TECHNIQUE: Multiplanar, multiecho pulse sequences of the brain and surrounding structures were obtained without intravenous contrast. COMPARISON:  None. FINDINGS: Brain: Suboptimal evaluation for metastatic disease without intravenous contrast. Several lesions are identified and annotated primarily on axial  T2 series 6: 1.4 x 0.8 cm lesion of the left postcentral gyrus with moderate surrounding edema on series 6, image 20; 0.5 cm lesion of the left superior frontal gyrus on series 6, image 22; 0.7 cm lesion of the left superior frontal gyrus more inferiorly with minimal edema on series 6, image 18; 2.6 x 1.7 cm lesion of the right lateral ventricle along the septum pellucidum on series 6, image 15; 0.5 cm lesion of the parasagittal left occipital lobe on series 6, image 13 with corresponding susceptibility reflecting chronic blood products or mineralization; 1 cm lesion of lateral left cerebellum with mild surrounding edema on series 7, image 6; Question of a small area of edema versus gliosis involving the lateral left precentral gyrus on series 7, image 16 without definite lesion. There is no acute infarction or intracranial hemorrhage. There is no hydrocephalus or extra-axial fluid collection. Prominence of the ventricles and sulci reflects minor generalized parenchymal volume loss. Vascular: Major vessel flow voids at the skull base are preserved. Skull and upper cervical spine: Normal marrow signal is preserved. Sinuses/Orbits: Trace mucosal thickening. Bilateral lens replacements. Other: Sella is unremarkable.  Mastoid air cells are clear. IMPRESSION: Six intracranial lesions are identified likely reflecting metastases. There is edema associated with the larger lesions but no significant mass effect. Question of a small area of edema versus gliosis in the posterior left frontal lobe without definite underlying lesion. Electronically Signed   By: Macy Mis M.D.   On: 11/21/2020 13:04   Pelvis Portable  Result Date: 11/22/2020 CLINICAL DATA:  79 year old female status post hip replacement following pathologic fracture secondary to metastatic cancer. EXAM: PORTABLE PELVIS 1-2 VIEWS COMPARISON:  Intraoperative images 1359 hours today. FINDINGS: Portable AP view at 1552 hours. Bipolar right hip arthroplasty.  Hardware appears intact with normal AP alignment. No unexpected osseous changes. Regional postoperative soft tissue gas. Lytic lesion in the superior left femoral neck redemonstrated. Small volume retained oral contrast in the descending colon. Calcified uterine fibroid. IMPRESSION: 1. Bipolar right hip arthroplasty with no adverse features. 2. Proximal left femoral lytic metastasis. Electronically Signed   By: Genevie Ann M.D.   On: 11/22/2020 16:47   DG C-Arm 1-60 Min  Result Date: 11/22/2020 CLINICAL DATA:  Right hip hemiarthroplasty. EXAM: DG C-ARM 1-60 MIN; OPERATIVE RIGHT HIP WITH PELVIS FLUOROSCOPY TIME:  Fluoroscopy Time:  6.9 seconds COMPARISON:  November 18, 2020. FINDINGS: Four C-arm fluoroscopic images were obtained intraoperatively and submitted for post operative interpretation. These images demonstrate surgical changes of right hip arthroplasty. Please see the performing provider's procedural report for further detail. IMPRESSION: Intraoperative fluoroscopic images, as detailed above. Electronically Signed   By: Margaretha Sheffield MD   On: 11/22/2020 15:17   DG HIP OPERATIVE UNILAT WITH PELVIS RIGHT  Result Date: 11/22/2020  CLINICAL DATA:  Right hip hemiarthroplasty. EXAM: DG C-ARM 1-60 MIN; OPERATIVE RIGHT HIP WITH PELVIS FLUOROSCOPY TIME:  Fluoroscopy Time:  6.9 seconds COMPARISON:  November 18, 2020. FINDINGS: Four C-arm fluoroscopic images were obtained intraoperatively and submitted for post operative interpretation. These images demonstrate surgical changes of right hip arthroplasty. Please see the performing provider's procedural report for further detail. IMPRESSION: Intraoperative fluoroscopic images, as detailed above. Electronically Signed   By: Margaretha Sheffield MD   On: 11/22/2020 15:17   Medications: . sodium chloride    . sodium chloride Stopped (11/23/20 0403)  . [START ON 11/27/2020] iron sucrose    . methocarbamol (ROBAXIN) IV     . (feeding supplement) PROSource Plus  30  mL Oral BID BM  . acetaminophen  1,000 mg Oral Q6H  . amLODipine  10 mg Oral Daily  . bupivacaine liposome  20 mL Infiltration Once  . carvedilol  12.5 mg Oral BID WC  . Chlorhexidine Gluconate Cloth  6 each Topical Daily  . docusate sodium  100 mg Oral BID  . enoxaparin (LOVENOX) injection  30 mg Subcutaneous Q24H  . feeding supplement (NEPRO CARB STEADY)  237 mL Oral BID BM  . irbesartan  150 mg Oral Daily  . multivitamin  1 tablet Oral QHS  . rosuvastatin  10 mg Oral Daily  . tranexamic acid (CYKLOKAPRON) topical - INTRAOP  2,000 mg Topical Once   Nephrology attending: Patient was seen and examined personally and I agree with assessment plan as outlined above. Status post orthopedic surgery yesterday.  Clinically doing well.  Potassium 6.1 today.  Plan for dialysis.  Continue PT OT evaluation.  Katheran James, MD [facility].

## 2020-11-23 NOTE — Anesthesia Postprocedure Evaluation (Signed)
Anesthesia Post Note  Patient: Cassandra Dyer  Procedure(s) Performed: ANTERIOR APPROACH RIGHT HIP HEMIARTHROPLASTY (Right )     Patient location during evaluation: PACU Anesthesia Type: General Level of consciousness: awake and alert Pain management: pain level controlled Vital Signs Assessment: post-procedure vital signs reviewed and stable Respiratory status: spontaneous breathing, nonlabored ventilation, respiratory function stable and patient connected to nasal cannula oxygen Cardiovascular status: blood pressure returned to baseline and stable Postop Assessment: no apparent nausea or vomiting Anesthetic complications: no   No complications documented.  Last Vitals:  Vitals:   11/23/20 0104 11/23/20 0441  BP: (!) 107/56 (!) 103/49  Pulse: 80 75  Resp: 18 18  Temp: 36.8 C 36.5 C  SpO2: 100% 100%    Last Pain:  Vitals:   11/23/20 0104  TempSrc: Oral  PainSc:                  [place] S

## 2020-11-23 NOTE — Progress Notes (Signed)
PT Cancellation Note  Patient Details Name: RONDALYN BELFORD MRN: [id number] DOB: 1941/04/17   Cancelled Treatment:    Reason Eval/Treat Not Completed: Patient at procedure or test/unavailable. Pt just left for HD, acute PT to return as able to complete PT eval.  Kittie Plater, PT, DPT Acute Rehabilitation Services Pager #: [phone] Office #: [phone]    Berline Lopes 11/23/2020, 12:19 PM

## 2020-11-23 NOTE — Plan of Care (Signed)
  Problem: Education: Goal: Knowledge of General Education information will improve Description Including pain rating scale, medication(s)/side effects and non-pharmacologic comfort measures Outcome: Progressing   

## 2020-11-23 NOTE — Anesthesia Preprocedure Evaluation (Signed)
Anesthesia Evaluation  Patient identified by MRN, date of birth, ID band Patient awake    Reviewed: Allergy & Precautions, H&P , NPO status , Patient's Chart, lab work & pertinent test results  Airway Mallampati: II   Neck ROM: full    Dental   Pulmonary COPD, former smoker,    breath sounds clear to auscultation       Cardiovascular hypertension,  Rhythm:regular Rate:Normal     Neuro/Psych    GI/Hepatic   Endo/Other    Renal/GU Renal InsufficiencyRenal disease     Musculoskeletal   Abdominal   Peds  Hematology  (+) Blood dyscrasia, anemia ,   Anesthesia Other Findings   Reproductive/Obstetrics                             Anesthesia Physical Anesthesia Plan  ASA: II  Anesthesia Plan: General   Post-op Pain Management:    Induction: Intravenous  PONV Risk Score and Plan: 3 and Ondansetron, Dexamethasone and Treatment may vary due to age or medical condition  Airway Management Planned: Oral ETT  Additional Equipment:   Intra-op Plan:   Post-operative Plan: Extubation in OR  Informed Consent: I have reviewed the patients History and Physical, chart, labs and discussed the procedure including the risks, benefits and alternatives for the proposed anesthesia with the patient or authorized representative who has indicated his/her understanding and acceptance.       Plan Discussed with: CRNA, Anesthesiologist and Surgeon  Anesthesia Plan Comments:         Anesthesia Quick Evaluation

## 2020-11-23 NOTE — Progress Notes (Signed)
PROGRESS NOTE    Cassandra Dyer  PPJ:[id number] DOB: [birth year] DOA: 11/20/2020 PCP: Biagio Borg, MD   Brief Narrative:  Cassandra Dyer ADMITTING MD: Leane Para a 79 y.o.femalewith medical history significant of hypertension, hyperlipidemia,recent diagnosis oflung cancer;remote h/orenal cell carcinoma;COPD on 2L home O2;and ESRD on HD presented to hospital with generalized  weakness, missed HD. She also complained of right hip pain and ambulatory dysfunction.  She was unable to go to hemodialysis because of pain.  She also complained of generalized weakness, poor appetite and weight loss.  Of note, patient was previously hospitalized from 11/8-24 after missed HD x 1. She presented with acute on chronic hypoxic respiratory failure and underwent thoracentesis x 3.  2DEcho was performed with grade 2 diastolic dysfunction. She was found to have a L hilar mass and bronchoscopy with endobronchial [place] was performed. She was subsequently diagnosed with stage IV (T3, N2, M1a) NSCLC. MRI brain and PET scan were ordered. She was seen by Rad Onc on 12/2 with the plan for palliative radiation. She was seen again by the oncology PA on 12/6 with c/o SOB and R hip pain. CXR showed stable pleural effusions and R hip film with minimally displaced R femoral neck fracture concerning for pathologic fracture. She was told to stay off the hip and has been in a wheelchair since.   In the ED, patient was noted to be mildly hyperkalemic.  Nephrology was consulted for hemodialysis.  She had missed hemodialysis on Monday and Wednesday.  Patient was then admitted hospital for further evaluation and treatment.  Orthopedics was also consulted for possible surgical intervention.   Assessment & Plan:   Principal Problem:   Closed displaced fracture of right femoral neck (HCC) Active Problems:   Essential hypertension   Dyslipidemia   Hyperkalemia, diminished renal excretion   COPD (chronic obstructive  pulmonary disease) (HCC)   Adenocarcinoma of left lung, stage 4 (HCC)   Noncompliance of patient with renal dialysis ([place])   Acute right hip pain with pathological femoral neck fracture. Orthopedics on board.  MRI of the hip showed impacted subcapital right femoral neck pathological fracture.  POD 1 R THA  ESRD on hemodialysis, missed hemodialysis  Nephrology on board for hemodialysis, had mild hyperkalemia on presentation.  Had missed 2 sessions of hemodialysis as outpatient.  Continue hemodialysis as per nephrology.  Hyperkalemia -6.1, ND planned for today  Stage 4 lung cancer, recent diagnosis. Patient is supposed to start radiation soon.  MRI of the brain without contrast with 6 metastatic lesions with edema around largest one  Chest x-ray done showed cardiomegaly with central vascular congestion and new confluent patchy bilateral opacities.  Respiratory panel was negative for Covid and influenza.  Added Dr. Inda Merlin on the epic chat Will trial decadron today given RUE findings  Essential hypertension Continue Coreg, Micardis.  Discontinued Toprol-XL.  IV hydralazine as needed hypertension.  Blood pressure 113/57 at this time.  Hyperlipidemia -Continue Crestor  COPD with chronic hypoxic respiratory failure. On 2 L of oxygen by nasal cannula.  Continue bronchodilators.  Significant leukocytosis.  Possible reactive in nature.  Uncertain etiology.  No fever.  Patient has had WBC elevation for more than 2 weeks now.  Latest WBC of 25.6 .  T-max of 99.5.  DVT prophylaxis: SCD/Compression stockings  Code Status: dnr    Code Status Orders  (From admission, onward)         Start     Ordered   11/20/20 1624  Do not  attempt resuscitation (DNR)  Continuous       Question Answer Comment  In the event of cardiac or respiratory ARREST Do not call a "code blue"   In the event of cardiac or respiratory ARREST Do not perform Intubation, CPR, defibrillation or ACLS   In the event  of cardiac or respiratory ARREST Use medication by any route, position, wound care, and other measures to relive pain and suffering. May use oxygen, suction and manual treatment of airway obstruction as needed for comfort.      11/20/20 1627        Code Status History    Date Active Date Inactive Code Status Order ID Comments User Context   10/22/2020 0048 11/06/2020 2144 Full Code [id number]  Rise Patience, MD ED   09/10/2020 1310 09/12/2020 1725 Full Code [id number]  Mckinley Jewel, MD ED   07/27/2020 0535 08/08/2020 2121 DNR [id number]  Vernelle Emerald, MD Inpatient   07/27/2020 0512 07/27/2020 0535 DNR [id number]  Vernelle Emerald, MD Inpatient   04/08/2020 1503 04/11/2020 2314 Full Code [id number]  Norval Morton, MD ED   01/13/2017 1342 01/14/2017 2150 Full Code [id number]  Lattie Corns Inpatient   Advance Care Planning Activity     Family Communication: called pt contact, NA, LM  Disposition Plan: Patient remained in the hospital and inpatient for dialysis and management of postoperative hip repair as well as brain mets with edema possibly causing neurological symptoms Consults called: None Admission status: Inpatient   Consultants:   Nephrology  Procedures:  DG Chest 1 View  Result Date: 10/31/2020 CLINICAL DATA:  Status post thoracentesis EXAM: CHEST  1 VIEW COMPARISON:  July 30, 2020. FINDINGS: Left pleural effusion smaller following thoracentesis. Small pleural effusion on each side. No pneumothorax. There is slight bibasilar atelectasis. Heart size and pulmonary vascular normal. No adenopathy. Central catheter tip is at the cavoatrial junction. There is aortic atherosclerosis. No bone lesions. IMPRESSION: Small pleural effusion currently on each side with mild bibasilar atelectasis. No pneumothorax. No consolidation. Stable cardiac silhouette. Aortic Atherosclerosis (ICD10-I70.0). Electronically Signed   By: Lowella Grip III M.D.   On: 10/31/2020 12:21   DG  Chest 1 View  Result Date: 10/25/2020 CLINICAL DATA:  79 year old female with a history of thoracentesis EXAM: CHEST  1 VIEW COMPARISON:  10/24/2020, 10/23/2020 FINDINGS: Cardiomediastinal silhouette unchanged in size and contour with the heart borders partially obscured by overlying lung and pleural disease. Decreasing interlobular septal thickening. Unchanged right IJ tunneled hemodialysis catheter. Improved opacity at the right lung base with minimal blunting at the right costophrenic angle. No pneumothorax. Opacity at the left lung base is increased from the most recent comparison, though not as severe as the study dated 10/23/2020. No pneumothorax on the left. IMPRESSION: Decreased right-sided pleural effusion with no pneumothorax status post right thoracentesis. Recurrent left-sided pleural effusion and associated atelectasis/consolidation. Unchanged right IJ tunneled hemodialysis catheter. Improving pulmonary edema. Electronically Signed   By: Corrie Mckusick D.O.   On: 10/25/2020 16:40   DG Chest 2 View  Result Date: 11/20/2020 CLINICAL DATA:  cp EXAM: CHEST - 2 VIEW COMPARISON:  11/18/2020 FINDINGS: Tunneled right IJ CVC tip overlies the superior cavoatrial junction. Cardiomegaly with central pulmonary vascular congestion. Redemonstration of small bilateral pleural effusions. New patchy bilateral pulmonary opacities. Multilevel spondylosis. IMPRESSION: 1. Cardiomegaly with central pulmonary vascular congestion. Redemonstration of small bilateral pleural effusions. 2. New confluent/patchy bilateral pulmonary opacities. Differential includes edema versus infection. Electronically Signed   By: Georga Kaufmann  [place] M.D.   On: 11/20/2020 13:29   CT ANGIO CHEST PE W OR WO CONTRAST  Result Date: 11/02/2020 CLINICAL DATA:  Respiratory failure EXAM: CT ANGIOGRAPHY CHEST WITH CONTRAST TECHNIQUE: Multidetector CT imaging of the chest was performed using the standard protocol during bolus administration of  intravenous contrast. Multiplanar CT image reconstructions and MIPs were obtained to evaluate the vascular anatomy. CONTRAST:  76mL OMNIPAQUE IOHEXOL 350 MG/ML SOLN COMPARISON:  CT abdomen pelvis, 04/08/2020, 08/05/2017 FINDINGS: Cardiovascular: Satisfactory opacification of the pulmonary arteries. No evidence of pulmonary embolism. The left lower lobar pulmonary arteries are nearly occluded by a left hilar mass. Normal heart size. Left coronary artery calcifications. No pericardial effusion. Aortic atherosclerosis. Mediastinum/Nodes: Numerous enlarged left hilar and mediastinal lymph nodes, with a left hilar mass. Largest discretely measurable lymph nodes measure up to 2.1 x 1.4 cm (series 8, image 56). Thyroid gland, trachea, and esophagus demonstrate no significant findings. Lungs/Pleura: Mild centrilobular emphysema. Small to moderate, left greater than right pleural effusions and associated atelectasis or consolidation. There is a left hilar mass measuring approximately 5.2 x 4.0 cm, which obstructs the left lower lobar bronchus near the origin (series 9, image 208). There is a subpleural nodule of the posterior left apex measuring 1.4 x 1.3 cm (series 8, image 31) and an additional subpleural nodule of the high medial apex measuring 0.9 x 0.6 cm (series 8, image 25). Upper Abdomen: Partially imaged left adrenal nodule, not obviously changed compared to prior examination dated 04/08/2020 measuring 1.5 x 1.0 cm (series 9, image 39). Musculoskeletal: No chest wall abnormality. No acute or significant osseous findings. Review of the MIP images confirms the above findings. IMPRESSION: 1. Negative examination for pulmonary embolism. 2. There is a left hilar mass measuring approximately 5.2 x 4.0 cm, which obstructs the left lower lobar bronchus near the origin as well as the left lower lobar pulmonary arteries. 3. Subpleural nodules of the left pulmonary apex. 4. Numerous enlarged left hilar and mediastinal lymph  nodes. 5. Constellation of findings is consistent primary lung malignancy and nodal and left-sided pulmonary metastatic disease. 6. Small to moderate, left greater than right pleural effusions and associated atelectasis or consolidation. Left pleural effusion is presumed malignant given presence of subpleural pulmonary nodules, there is no direct evidence of right sided malignant effusion or metastatic disease. 7. Partially imaged left adrenal nodule, not obviously changed compared to prior examinations and previously better characterized as a benign adenoma. Aortic Atherosclerosis (ICD10-I70.0) and Emphysema (ICD10-J43.9). Electronically Signed   By: Eddie Candle M.D.   On: 11/02/2020 19:11   MR BRAIN WO CONTRAST  Result Date: 11/21/2020 CLINICAL DATA:  Metastatic lung cancer, history of renal cell carcinoma EXAM: MRI HEAD WITHOUT CONTRAST TECHNIQUE: Multiplanar, multiecho pulse sequences of the brain and surrounding structures were obtained without intravenous contrast. COMPARISON:  None. FINDINGS: Brain: Suboptimal evaluation for metastatic disease without intravenous contrast. Several lesions are identified and annotated primarily on axial T2 series 6: 1.4 x 0.8 cm lesion of the left postcentral gyrus with moderate surrounding edema on series 6, image 20; 0.5 cm lesion of the left superior frontal gyrus on series 6, image 22; 0.7 cm lesion of the left superior frontal gyrus more inferiorly with minimal edema on series 6, image 18; 2.6 x 1.7 cm lesion of the right lateral ventricle along the septum pellucidum on series 6, image 15; 0.5 cm lesion of the parasagittal left occipital lobe on series 6, image 13 with corresponding susceptibility reflecting chronic blood products or mineralization;  1 cm lesion of lateral left cerebellum with mild surrounding edema on series 7, image 6; Question of a small area of edema versus gliosis involving the lateral left precentral gyrus on series 7, image 16 without definite  lesion. There is no acute infarction or intracranial hemorrhage. There is no hydrocephalus or extra-axial fluid collection. Prominence of the ventricles and sulci reflects minor generalized parenchymal volume loss. Vascular: Major vessel flow voids at the skull base are preserved. Skull and upper cervical spine: Normal marrow signal is preserved. Sinuses/Orbits: Trace mucosal thickening. Bilateral lens replacements. Other: Sella is unremarkable.  Mastoid air cells are clear. IMPRESSION: Six intracranial lesions are identified likely reflecting metastases. There is edema associated with the larger lesions but no significant mass effect. Question of a small area of edema versus gliosis in the posterior left frontal lobe without definite underlying lesion. Electronically Signed   By: Macy Mis M.D.   On: 11/21/2020 13:04   DG Chest Bilateral Decubitus  Result Date: 11/18/2020 CLINICAL DATA:  79 year old female with history of pleural effusions and thoracentesis, chest CT last month revealing left hilar mass. Shortness of breath. New right hip pain. EXAM: CHEST - BILATERAL DECUBITUS VIEW COMPARISON:  Portable chest 11/05/2020. Chest CTA 11/02/2020, and earlier. FINDINGS: Bilateral lateral decubitus views of the chest are provided. Both sides demonstrate layering pleural fluid, fairly symmetric with fluid to about 1.5 cm thickness on both sides. This is similar to the CTA chest 11/02/2020. The right side non dependent costophrenic angle clears better than the left. Grossly stable mediastinal contours, and right chest dual lumen dialysis type catheter remains in place. IMPRESSION: Fairly symmetric bilateral layering pleural effusions, fluid volume likely not significantly changed from the CTA 11/02/2020. Electronically Signed   By: Genevie Ann M.D.   On: 11/18/2020 16:38   Pelvis Portable  Result Date: 11/22/2020 CLINICAL DATA:  79 year old female status post hip replacement following pathologic fracture  secondary to metastatic cancer. EXAM: PORTABLE PELVIS 1-2 VIEWS COMPARISON:  Intraoperative images 1359 hours today. FINDINGS: Portable AP view at 1552 hours. Bipolar right hip arthroplasty. Hardware appears intact with normal AP alignment. No unexpected osseous changes. Regional postoperative soft tissue gas. Lytic lesion in the superior left femoral neck redemonstrated. Small volume retained oral contrast in the descending colon. Calcified uterine fibroid. IMPRESSION: 1. Bipolar right hip arthroplasty with no adverse features. 2. Proximal left femoral lytic metastasis. Electronically Signed   By: Genevie Ann M.D.   On: 11/22/2020 16:47   MR FEMUR RIGHT W WO CONTRAST  Result Date: 11/20/2020 CLINICAL DATA:  Fracture with metastatic disease EXAM: MRI OF THE RIGHT FEMUR WITHOUT AND WITH CONTRAST TECHNIQUE: Multiplanar, multisequence MR imaging of the right was performed both before and after administration of intravenous contrast. CONTRAST:  61mL GADAVIST GADOBUTROL 1 MMOL/ML IV SOLN COMPARISON:  None. FINDINGS: Bones/Joint/Cartilage There is a nondisplaced slightly impacted subcapital right femoral neck fracture as on the recent exam. There is a heterogeneous T1 isointense/heterogeneously T2 bright peripherally enhancing lesion within the medial right femoral neck. Mild surrounding marrow edema seen. No other fracture is identified. There is moderate bilateral hip osteoarthritis. Within the left femoral head/neck there is also a T1 hypointense/T2 heterogeneous lesion. A moderate right hip joint effusion is seen. Ligaments The ligamentum teres appears to be grossly intact Muscles and Tendons There is edema seen within the adductor musculature and iliopsoas musculature. The tendons appear to be grossly intact. Soft tissues Soft tissue swelling seen along the lateral aspect of the hip. Multiple hypointense uterine  fibroids are seen. There is cystic lesion seen within the left adnexa which is partially visualized.  IMPRESSION: Nondisplaced slightly impacted subcapital right femoral neck pathologic fracture. There is a osseous lesion within the right femoral neck, likely metastatic lesion . Would recommend nuclear medicine bone scan for further evaluation other osseous lesions. Osseous lesion within the left femoral head/neck concerning for metastatic lesion. Moderate right hip osteoarthritis and joint effusion Electronically Signed   By: Prudencio Pair M.D.   On: 11/20/2020 20:18   DG Chest Port 1 View  Result Date: 11/05/2020 CLINICAL DATA:  Patient status post left bronchoscopy today. EXAM: PORTABLE CHEST 1 VIEW COMPARISON:  Single-view of the chest 11/01/2019. FINDINGS: Negative for pneumothorax after bronchoscopy. Small bilateral pleural effusions and basilar atelectasis are worse on the left. Heart size is normal. Aortic atherosclerosis. Dialysis catheter is unchanged. IMPRESSION: Negative for pneumothorax after bronchoscopy. Small bilateral pleural effusions and basilar airspace disease, worse on the left. Electronically Signed   By: Inge Rise M.D.   On: 11/05/2020 12:17   DG CHEST PORT 1 VIEW  Result Date: 10/30/2020 CLINICAL DATA:  Shortness of breath EXAM: PORTABLE CHEST 1 VIEW COMPARISON:  October 28, 2020 FINDINGS: Central catheter tip is at the cavoatrial junction. No pneumothorax. There are pleural effusions bilaterally with bibasilar atelectasis. Heart is mildly enlarged with pulmonary vascularity normal. No adenopathy. There is aortic atherosclerosis. No bone lesions. IMPRESSION: Central catheter as described without pneumothorax. Pleural effusions bilaterally with bibasilar atelectasis. It should be noted that superimposed pneumonia in the bases cannot be excluded by radiography. No new opacity appreciable. Stable cardiac silhouette. Aortic Atherosclerosis (ICD10-I70.0). Electronically Signed   By: Lowella Grip III M.D.   On: 10/30/2020 08:15   DG CHEST PORT 1 VIEW  Result Date:  10/28/2020 CLINICAL DATA:  Dyspnea, end-stage renal disease EXAM: PORTABLE CHEST 1 VIEW COMPARISON:  10/25/2020 FINDINGS: Right internal jugular hemodialysis catheter is seen with its tip at the superior right atrium. Pulmonary insufflation is normal. Large left and small right pleural effusions are present and have enlarged in size since prior examination. Superimposed mild interstitial pulmonary edema has progressed in the interval since prior examination. No pneumothorax. Cardiac size is at the upper limits of normal. No acute bone abnormality. IMPRESSION: Progressive cardiogenic failure or volume overload with progressive interstitial pulmonary infiltrate and enlarging bilateral pleural effusions, left greater than right. Electronically Signed   By: Fidela Salisbury MD   On: 10/28/2020 06:42   DG C-Arm 1-60 Min  Result Date: 11/22/2020 CLINICAL DATA:  Right hip hemiarthroplasty. EXAM: DG C-ARM 1-60 MIN; OPERATIVE RIGHT HIP WITH PELVIS FLUOROSCOPY TIME:  Fluoroscopy Time:  6.9 seconds COMPARISON:  November 18, 2020. FINDINGS: Four C-arm fluoroscopic images were obtained intraoperatively and submitted for post operative interpretation. These images demonstrate surgical changes of right hip arthroplasty. Please see the performing provider's procedural report for further detail. IMPRESSION: Intraoperative fluoroscopic images, as detailed above. Electronically Signed   By: Margaretha Sheffield MD   On: 11/22/2020 15:17   ECHOCARDIOGRAM COMPLETE  Result Date: 10/29/2020    ECHOCARDIOGRAM REPORT   Patient Name:   Cassandra Dyer Date of Exam: 10/29/2020 Medical Rec #:  [id number]       Height:       62.0 in Accession #:    [id number]      Weight:       121.3 lb Date of Birth:  [birth year]       BSA:  1.545 m Patient Age:    59 years        BP:           113/56 mmHg Patient Gender: F               HR:           83 bpm. Exam Location:  Inpatient Procedure: 2D Echo, Cardiac Doppler and Color Doppler  Indications:    Fluid Overload  History:        Patient has no prior history of Echocardiogram examinations.                 Risk Factors:Hypertension and Dyslipidemia.  Sonographer:    Bernadene Person RDCS Referring Phys: [id number]  1. Left ventricular ejection fraction, by estimation, is 60 to 65%. The left ventricle has normal function. The left ventricle has no regional wall motion abnormalities. Left ventricular diastolic parameters are consistent with Grade II diastolic dysfunction (pseudonormalization).  2. Right ventricular systolic function is normal. The right ventricular size is mildly enlarged. There is moderately elevated pulmonary artery systolic pressure. The estimated right ventricular systolic pressure is 69.4 mmHg.  3. The mitral valve is grossly normal. No evidence of mitral valve regurgitation. No evidence of mitral stenosis.  4. The aortic valve is abnormal. There is mild calcification of the aortic valve. Aortic valve regurgitation is not visualized. Mild aortic valve sclerosis is present, with no evidence of aortic valve stenosis.  5. The inferior vena cava is normal in size with greater than 50% respiratory variability, suggesting right atrial pressure of 3 mmHg. FINDINGS  Left Ventricle: Left ventricular ejection fraction, by estimation, is 60 to 65%. The left ventricle has normal function. The left ventricle has no regional wall motion abnormalities. The left ventricular internal cavity size was normal in size. There is  no left ventricular hypertrophy. Left ventricular diastolic parameters are consistent with Grade II diastolic dysfunction (pseudonormalization). Right Ventricle: The right ventricular size is mildly enlarged. No increase in right ventricular wall thickness. Right ventricular systolic function is normal. There is moderately elevated pulmonary artery systolic pressure. The tricuspid regurgitant velocity is 3.50 m/s, and with an assumed right atrial  pressure of 3 mmHg, the estimated right ventricular systolic pressure is 85.4 mmHg. Left Atrium: Left atrial size was normal in size. Right Atrium: Right atrial size was normal in size. Pericardium: There is no evidence of pericardial effusion. Mitral Valve: The mitral valve is grossly normal. No evidence of mitral valve regurgitation. No evidence of mitral valve stenosis. Tricuspid Valve: The tricuspid valve is normal in structure. Tricuspid valve regurgitation is mild . No evidence of tricuspid stenosis. Aortic Valve: The aortic valve is abnormal. There is mild calcification of the aortic valve. Aortic valve regurgitation is not visualized. Mild aortic valve sclerosis is present, with no evidence of aortic valve stenosis. Pulmonic Valve: The pulmonic valve was grossly normal. Pulmonic valve regurgitation is mild to moderate. No evidence of pulmonic stenosis. Aorta: The aortic root is normal in size and structure. Venous: The inferior vena cava is normal in size with greater than 50% respiratory variability, suggesting right atrial pressure of 3 mmHg. IAS/Shunts: No atrial level shunt detected by color flow Doppler. Additional Comments: A venous catheter is visualized in the right atrium. There is a moderate pleural effusion.  LEFT VENTRICLE PLAX 2D LVIDd:         4.17 cm  Diastology LVIDs:         2.21 cm  LV e' medial:    4.90 cm/s LV PW:         0.59 cm  LV E/e' medial:  22.0 LV IVS:        0.64 cm  LV e' lateral:   8.05 cm/s LVOT diam:     2.10 cm  LV E/e' lateral: 13.4 LV SV:         90 LV SV Index:   58 LVOT Area:     3.46 cm  RIGHT VENTRICLE TAPSE (M-mode): 1.9 cm LEFT ATRIUM             Index       RIGHT ATRIUM           Index LA diam:        3.70 cm 2.39 cm/m  RA Area:     14.50 cm LA Vol (A2C):   49.5 ml 32.03 ml/m RA Volume:   36.30 ml  23.49 ml/m LA Vol (A4C):   46.9 ml 30.35 ml/m LA Biplane Vol: 50.1 ml 32.42 ml/m  AORTIC VALVE LVOT Vmax:   145.00 cm/s LVOT Vmean:  93.500 cm/s LVOT VTI:    0.259  m  AORTA Ao Root diam: 2.70 cm Ao Asc diam:  2.10 cm MITRAL VALVE                TRICUSPID VALVE MV Area (PHT): 2.91 cm     TR Peak grad:   49.0 mmHg MV Decel Time: 261 msec     TR Vmax:        350.00 cm/s MV E velocity: 108.00 cm/s MV A velocity: 124.00 cm/s  SHUNTS MV E/A ratio:  0.87         Systemic VTI:  0.26 m                             Systemic Diam: 2.10 cm Cherlynn Kaiser MD Electronically signed by Cherlynn Kaiser MD Signature Date/Time: 10/29/2020/4:59:56 PM    Final    DG Hip Unilat W or W/O Pelvis 1 View Right  Addendum Date: 11/18/2020   ADDENDUM REPORT: 11/18/2020 16:53 ADDENDUM: Study discussed by telephone with PA-C VAN TANNER on 11/18/2020 at 1645 hours. Electronically Signed   By: Genevie Ann M.D.   On: 11/18/2020 16:53   Result Date: 11/18/2020 CLINICAL DATA:  79 year old female with history of pleural effusions and thoracentesis, chest CT last month revealing left hilar mass. Shortness of breath. New right hip pain. EXAM: DG HIP (WITH OR WITHOUT PELVIS) 1V RIGHT COMPARISON:  CT Abdomen and Pelvis 04/08/2020. FINDINGS: There is a minimally displaced right femoral neck fracture. The fracture appears to extend from the superior mid neck toward the inferior subcapital neck region. The right femoral head remains normally located. The right femur intertrochanteric segment and visible proximal femoral shaft appear intact. Bone mineralization about the pelvis and proximal femurs does not appear significantly changed from the April CT. No destructive osseous lesion identified radiographically. Pelvis and proximal left femur appear intact. Chronic calcified uterine fibroid again noted. Negative visible other lower abdominal and pelvic visceral contours. IMPRESSION: 1. Minimally displaced right femoral neck fracture, suspicious for pathologic fracture in this setting. 2. MRI versus whole-body bone scan would probably be most sensitive for detection of osseous metastatic disease. Electronically Signed:  By: Genevie Ann M.D. On: 11/18/2020 16:41   DG HIP OPERATIVE UNILAT WITH PELVIS RIGHT  Result Date: 11/22/2020 CLINICAL DATA:  Right hip hemiarthroplasty.  EXAM: DG C-ARM 1-60 MIN; OPERATIVE RIGHT HIP WITH PELVIS FLUOROSCOPY TIME:  Fluoroscopy Time:  6.9 seconds COMPARISON:  November 18, 2020. FINDINGS: Four C-arm fluoroscopic images were obtained intraoperatively and submitted for post operative interpretation. These images demonstrate surgical changes of right hip arthroplasty. Please see the performing provider's procedural report for further detail. IMPRESSION: Intraoperative fluoroscopic images, as detailed above. Electronically Signed   By: Margaretha Sheffield MD   On: 11/22/2020 15:17   IR THORACENTESIS ASP PLEURAL SPACE W/IMG GUIDE  Result Date: 10/31/2020 INDICATION: Patient with end-stage renal disease, bilateral pleural effusions. Request is made for therapeutic thoracentesis. EXAM: ULTRASOUND GUIDED THERAPEUTIC RIGHT THORACENTESIS MEDICATIONS: 10 mL 1% lidocaine COMPLICATIONS: None immediate. PROCEDURE: An ultrasound guided thoracentesis was thoroughly discussed with the patient and questions answered. The benefits, risks, alternatives and complications were also discussed. The patient understands and wishes to proceed with the procedure. Written consent was obtained. Ultrasound was performed to localize and mark an adequate pocket of fluid in the right chest. The area was then prepped and draped in the normal sterile fashion. 1% Lidocaine was used for local anesthesia. Under ultrasound guidance a 6 Fr Safe-T-Centesis catheter was introduced. Thoracentesis was performed. The catheter was removed and a dressing applied. FINDINGS: A total of approximately 650 mL of amber fluid was removed. Samples were sent to the laboratory as requested by the clinical team. IMPRESSION: Successful ultrasound guided therapeutic right thoracentesis yielding 650 mL of pleural fluid. Read by: Brynda Greathouse PA-C  Electronically Signed   By: Jerilynn Mages.  Shick M.D.   On: 10/31/2020 16:13   IR THORACENTESIS ASP PLEURAL SPACE W/IMG GUIDE  Result Date: 10/25/2020 INDICATION: Shortness of breath. Right-sided pleural effusion. Request for therapeutic thoracentesis. EXAM: ULTRASOUND GUIDED RIGHT THORACENTESIS MEDICATIONS: 1% plain lidocaine, 5 mL COMPLICATIONS: None immediate. PROCEDURE: An ultrasound guided thoracentesis was thoroughly discussed with the patient and questions answered. The benefits, risks, alternatives and complications were also discussed. The patient understands and wishes to proceed with the procedure. Written consent was obtained. Ultrasound was performed to localize and mark an adequate pocket of fluid in the right chest. The area was then prepped and draped in the normal sterile fashion. 1% Lidocaine was used for local anesthesia. Under ultrasound guidance a 6 Fr Safe-T-Centesis catheter was introduced. Thoracentesis was performed. The catheter was removed and a dressing applied. FINDINGS: A total of approximately 700 mL of clear yellow fluid was removed. IMPRESSION: Successful ultrasound guided right thoracentesis yielding 700 mL of pleural fluid. Read by: Ascencion Dike PA-C Electronically Signed   By: Aletta Edouard M.D.   On: 10/25/2020 16:35   DG C-ARM BRONCHOSCOPY  Result Date: 11/05/2020 C-ARM BRONCHOSCOPY: Fluoroscopy was utilized by the requesting physician.  No radiographic interpretation.     Antimicrobials:   See MAR   Subjective: Patient reported right upper extremity tremors x4 to 5 days Otherwise no acute events  Objective: Vitals:   11/23/20 1134 11/23/20 1155 11/23/20 1203 11/23/20 1208  BP: (!) 106/51  96/71   Pulse: 82     Resp: 18  17 (!) 25  Temp: 98.4 F (36.9 C) 98.3 F (36.8 C)    TempSrc: Oral Oral    SpO2: 98% 98%    Weight:  54.7 kg    Height:        Intake/Output Summary (Last 24 hours) at 11/23/2020 1247 Last data filed at 11/23/2020 0900 Gross per  24 hour  Intake 838.49 ml  Output 100 ml  Net 738.49 ml   [employer]  11/22/20 1320 11/23/20 0441 11/23/20 1155  Weight: 49.4 kg 52.6 kg 54.7 kg    Examination:  General exam: Appears calm and comfortable  Respiratory system: Clear to auscultation. Respiratory effort normal. Cardiovascular system: S1 & S2 heard, RRR. No JVD, murmurs, rubs, gallops or clicks. No pedal edema. Gastrointestinal system: Abdomen is nondistended, soft and nontender. No organomegaly or masses felt. Normal bowel sounds heard. Central nervous system: Alert and oriented. No focal neurological deficits. Extremities: Nonpurposeful movement right upper extremity otherwise exam within normal limits warm well perfused neurovascularly intact Skin: No new rashes, lesions or ulcers Psychiatry: Judgement and insight appear normal. Mood & affect appropriate.     Data Reviewed: I have personally reviewed following labs and imaging studies  CBC: Recent Labs  Lab 11/18/20 1338 11/20/20 1240 11/21/20 0117 11/22/20 0249 11/23/20 0502  WBC 33.8* 34.8* 30.5* 22.9* 25.6*  NEUTROABS 30.5*  --   --   --   --   HGB 10.3* 10.6* 10.0* 8.3* 8.7*  HCT 34.4* 33.1* 32.1* 27.2* 28.0*  MCV 88.7 89.0 87.2 89.5 88.6  PLT 412* 448* 367 302 030   Basic Metabolic Panel: Recent Labs  Lab 11/18/20 1338 11/20/20 1240 11/21/20 0117 11/22/20 0249 11/23/20 0502  NA 138 134* 136 134* 133*  K 5.5* 6.8* 3.6 4.3 6.1*  CL 94* 91* 95* 96* 95*  CO2 32 25 24 26 24   GLUCOSE 140* 83 94 107* 120*  BUN 40* 63* 15 5* 20  CREATININE 7.90* 10.96* 3.93* 1.82* 3.91*  CALCIUM 10.5* 9.8 7.9* 7.7* 8.8*  MG  --   --   --  1.8 2.0   GFR: Estimated Creatinine Clearance: 9.5 mL/min (A) (by C-G formula based on SCr of 3.91 mg/dL (H)). Liver Function Tests: Recent Labs  Lab 11/18/20 1338 11/20/20 1240 11/22/20 0249  AST 25 34 29  ALT 18 16 12   ALKPHOS 163* 175* 130*  BILITOT 0.5 0.9 0.7  PROT 6.9 6.5 5.0*  ALBUMIN 2.1* 2.2* 1.6*    No results for input(s): LIPASE, AMYLASE in the last 168 hours. No results for input(s): AMMONIA in the last 168 hours. Coagulation Profile: No results for input(s): INR, PROTIME in the last 168 hours. Cardiac Enzymes: No results for input(s): CKTOTAL, CKMB, CKMBINDEX, TROPONINI in the last 168 hours. BNP (last 3 results) No results for input(s): PROBNP in the last 8760 hours. HbA1C: No results for input(s): HGBA1C in the last 72 hours. CBG: Recent Labs  Lab 11/20/20 1746 11/22/20 1519  GLUCAP 141* 101*   Lipid Profile: No results for input(s): CHOL, HDL, LDLCALC, TRIG, CHOLHDL, LDLDIRECT in the last 72 hours. Thyroid Function Tests: No results for input(s): TSH, T4TOTAL, FREET4, T3FREE, THYROIDAB in the last 72 hours. Anemia Panel: No results for input(s): VITAMINB12, FOLATE, FERRITIN, TIBC, IRON, RETICCTPCT in the last 72 hours. Sepsis Labs: No results for input(s): PROCALCITON, LATICACIDVEN in the last 168 hours.  Recent Results (from the past 240 hour(s))  Resp Panel by RT-PCR (Flu A&B, Covid) Nasopharyngeal Swab     Status: None   Collection Time: 11/20/20  3:59 PM   Specimen: Nasopharyngeal Swab; Nasopharyngeal(NP) swabs in vial transport medium  Result Value Ref Range Status   SARS Coronavirus 2 by RT PCR NEGATIVE NEGATIVE Final    Comment: (NOTE) SARS-CoV-2 target nucleic acids are NOT DETECTED.  The SARS-CoV-2 RNA is generally detectable in upper respiratory specimens during the acute phase of infection. The lowest concentration of SARS-CoV-2 viral copies this assay can detect is 138  copies/mL. A negative result does not preclude SARS-Cov-2 infection and should not be used as the sole basis for treatment or other patient management decisions. A negative result may occur with  improper specimen collection/handling, submission of specimen other than nasopharyngeal swab, presence of viral mutation(s) within the areas targeted by this assay, and inadequate number  of viral copies(<138 copies/mL). A negative result must be combined with clinical observations, patient history, and epidemiological information. The expected result is Negative.  Fact Sheet for Patients:  EntrepreneurPulse.com.au  Fact Sheet for Healthcare Providers:  IncredibleEmployment.be  This test is no t yet approved or cleared by the [place] FDA and  has been authorized for detection and/or diagnosis of SARS-CoV-2 by FDA under an Emergency Use Authorization (EUA). This EUA will remain  in effect (meaning this test can be used) for the duration of the COVID-19 declaration under Section 564(b)(1) of the Act, 21 U.S.C.section 360bbb-3(b)(1), unless the authorization is terminated  or revoked sooner.       Influenza A by PCR NEGATIVE NEGATIVE Final   Influenza B by PCR NEGATIVE NEGATIVE Final    Comment: (NOTE) The Xpert Xpress SARS-CoV-2/FLU/RSV plus assay is intended as an aid in the diagnosis of influenza from Nasopharyngeal swab specimens and should not be used as a sole basis for treatment. Nasal washings and aspirates are unacceptable for Xpert Xpress SARS-CoV-2/FLU/RSV testing.  Fact Sheet for Patients: EntrepreneurPulse.com.au  Fact Sheet for Healthcare Providers: IncredibleEmployment.be  This test is not yet approved or cleared by the [place] FDA and has been authorized for detection and/or diagnosis of SARS-CoV-2 by FDA under an Emergency Use Authorization (EUA). This EUA will remain in effect (meaning this test can be used) for the duration of the COVID-19 declaration under Section 564(b)(1) of the Act, 21 U.S.C. section 360bbb-3(b)(1), unless the authorization is terminated or revoked.  Performed at [facility], [place] [address], [place], [place] [address]   Surgical pcr screen     Status: None   Collection Time: 11/21/20  8:22 PM   Specimen: Nasal Mucosa; Nasal Swab   Result Value Ref Range Status   MRSA, PCR NEGATIVE NEGATIVE Final   Staphylococcus aureus NEGATIVE NEGATIVE Final    Comment: (NOTE) The Xpert SA Assay (FDA approved for NASAL specimens in patients 75 years of age and older), is one component of a comprehensive surveillance program. It is not intended to diagnose infection nor to guide or monitor treatment. Performed at [facility], [place] [address] [place], [place] [address]          Radiology Studies: Pelvis Portable  Result Date: 11/22/2020 CLINICAL DATA:  79 year old female status post hip replacement following pathologic fracture secondary to metastatic cancer. EXAM: PORTABLE PELVIS 1-2 VIEWS COMPARISON:  Intraoperative images 1359 hours today. FINDINGS: Portable AP view at 1552 hours. Bipolar right hip arthroplasty. Hardware appears intact with normal AP alignment. No unexpected osseous changes. Regional postoperative soft tissue gas. Lytic lesion in the superior left femoral neck redemonstrated. Small volume retained oral contrast in the descending colon. Calcified uterine fibroid. IMPRESSION: 1. Bipolar right hip arthroplasty with no adverse features. 2. Proximal left femoral lytic metastasis. Electronically Signed   By: Genevie Ann M.D.   On: 11/22/2020 16:47   DG C-Arm 1-60 Min  Result Date: 11/22/2020 CLINICAL DATA:  Right hip hemiarthroplasty. EXAM: DG C-ARM 1-60 MIN; OPERATIVE RIGHT HIP WITH PELVIS FLUOROSCOPY TIME:  Fluoroscopy Time:  6.9 seconds COMPARISON:  November 18, 2020. FINDINGS: Four C-arm fluoroscopic  images were obtained intraoperatively and submitted for post operative interpretation. These images demonstrate surgical changes of right hip arthroplasty. Please see the performing provider's procedural report for further detail. IMPRESSION: Intraoperative fluoroscopic images, as detailed above. Electronically Signed   By: Margaretha Sheffield MD   On: 11/22/2020 15:17   DG HIP OPERATIVE UNILAT WITH PELVIS  RIGHT  Result Date: 11/22/2020 CLINICAL DATA:  Right hip hemiarthroplasty. EXAM: DG C-ARM 1-60 MIN; OPERATIVE RIGHT HIP WITH PELVIS FLUOROSCOPY TIME:  Fluoroscopy Time:  6.9 seconds COMPARISON:  November 18, 2020. FINDINGS: Four C-arm fluoroscopic images were obtained intraoperatively and submitted for post operative interpretation. These images demonstrate surgical changes of right hip arthroplasty. Please see the performing provider's procedural report for further detail. IMPRESSION: Intraoperative fluoroscopic images, as detailed above. Electronically Signed   By: Margaretha Sheffield MD   On: 11/22/2020 15:17        Scheduled Meds: . (feeding supplement) PROSource Plus  30 mL Oral BID BM  . acetaminophen  1,000 mg Oral Q6H  . amLODipine  10 mg Oral Daily  . bupivacaine liposome  20 mL Infiltration Once  . carvedilol  12.5 mg Oral BID WC  . Chlorhexidine Gluconate Cloth  6 each Topical Daily  . dexamethasone (DECADRON) injection  8 mg Intravenous Q6H  . docusate sodium  100 mg Oral BID  . enoxaparin (LOVENOX) injection  30 mg Subcutaneous Q24H  . feeding supplement (NEPRO CARB STEADY)  237 mL Oral BID BM  . irbesartan  150 mg Oral Daily  . multivitamin  1 tablet Oral QHS  . rosuvastatin  10 mg Oral Daily  . tranexamic acid (CYKLOKAPRON) topical - INTRAOP  2,000 mg Topical Once   Continuous Infusions: . sodium chloride    . sodium chloride Stopped (11/23/20 0403)  . [START ON 11/27/2020] iron sucrose    . methocarbamol (ROBAXIN) IV       LOS: 3 days    Time spent: Martin, MD [facility]  If 7PM-7AM, please contact night-coverage  11/23/2020, 12:47 PM

## 2020-11-23 NOTE — Progress Notes (Signed)
RN was unable to turn patient every 2 hours throughout shift when patient was off unit in hemodialysis, RN resumed q2h turning of patient when the patient returned from hemodialysis

## 2020-11-24 ENCOUNTER — Inpatient Hospital Stay (HOSPITAL_COMMUNITY): Payer: Medicare Other

## 2020-11-24 LAB — BASIC METABOLIC PANEL
Anion gap: 12 (ref 5–15)
BUN: 14 mg/dL (ref 8–23)
CO2: 26 mmol/L (ref 22–32)
Calcium: 8.2 mg/dL — ABNORMAL LOW (ref 8.9–10.3)
Chloride: 95 mmol/L — ABNORMAL LOW (ref 98–111)
Creatinine, Ser: 2.33 mg/dL — ABNORMAL HIGH (ref 0.44–1.00)
GFR, Estimated: 21 mL/min — ABNORMAL LOW (ref >60)
Glucose, Bld: 133 mg/dL — ABNORMAL HIGH (ref 70–99)
Potassium: 3.9 mmol/L (ref 3.5–5.1)
Sodium: 133 mmol/L — ABNORMAL LOW (ref 135–145)

## 2020-11-24 LAB — CBC
HCT: 25.7 % — ABNORMAL LOW (ref 36.0–46.0)
Hemoglobin: 8.4 g/dL — ABNORMAL LOW (ref 12.0–15.0)
MCH: 28.9 pg (ref 26.0–34.0)
MCHC: 32.7 g/dL (ref 30.0–36.0)
MCV: 88.3 fL (ref 80.0–100.0)
Platelets: 363 10*3/uL (ref 150–400)
RBC: 2.91 MIL/uL — ABNORMAL LOW (ref 3.87–5.11)
RDW: 16.1 % — ABNORMAL HIGH (ref 11.5–15.5)
WBC: 24.4 10*3/uL — ABNORMAL HIGH (ref 4.0–10.5)
nRBC: 0 % (ref 0.0–0.2)

## 2020-11-24 NOTE — Evaluation (Signed)
Occupational Therapy Evaluation Patient Details Name: Cassandra Dyer MRN: [id number] DOB: September 22, 1941 Today's Date: 11/24/2020    History of Present Illness 79 y.o. female admitted with weakness, and missed HD session due to Rt hip pain.  CXR  on 12/6 showed minimally displaced Rt femoral neck fracture concerning for pathological fx, and was waiting on appointment with orthopedist.  MRI showed pathological fractures bil. femoral necks. SHe underwent THA, direct anterior approach Rt hip.  PMH includes: HTN, HLD, recent dx Lung CA, remote h/o renal cell CA, COPD on 2L 02, ESRD on HD   Clinical Impression   PTA pt living with a friend and functioning at independent level for ADL and required as needed assist for IADL. At time of eval, pt able to complete bed mobility with min A and sit <> stands with min A +2 and RW. Pt was able to mobilize a short distance in the room to recliner to simulate toilet transfer. She currently requires mod A for LB ADLs. VSS on 2L [place], which is pts baseline O2 needs. Given current status, recommend CIR at d/c for intensive therapies prior to d/c home to facilitate independent baseline. Will continue to follow per POC listed below.      Follow Up Recommendations  CIR    Equipment Recommendations  Wheelchair (measurements OT);Wheelchair cushion (measurements OT);3 in 1 bedside commode    Recommendations for Other Services Rehab consult     Precautions / Restrictions Precautions Precautions: Fall Restrictions Weight Bearing Restrictions: Yes RLE Weight Bearing: Weight bearing as tolerated      Mobility Bed Mobility Overal bed mobility: Needs Assistance Bed Mobility: Supine to Sit     Supine to sit: Min assist     General bed mobility comments: increased time and effort to mobilize BLEs to EOB and lift trunk with use of bed rail. Light min A given to shift hips to EOB    Transfers Overall transfer level: Needs assistance Equipment used: Rolling walker  (2 wheeled) Transfers: Sit to/from Stand Sit to Stand: Min assist;+2 physical assistance;+2 safety/equipment         General transfer comment: assist to rise and steady    Balance Overall balance assessment: Needs assistance Sitting-balance support: Feet supported Sitting balance-Leahy Scale: Good     Standing balance support: During functional activity;No upper extremity supported Standing balance-Leahy Scale: Poor Standing balance comment: reliant on external support                           ADL either performed or assessed with clinical judgement   ADL Overall ADL's : Needs assistance/impaired Eating/Feeding: Set up;Sitting   Grooming: Set up;Sitting   Upper Body Bathing: Set up;Sitting   Lower Body Bathing: Moderate assistance;Sitting/lateral leans;Sit to/from stand   Upper Body Dressing : Set up;Sitting   Lower Body Dressing: Moderate assistance;Sitting/lateral leans;Sit to/from stand   Toilet Transfer: Minimal assistance;+2 for physical assistance;+2 for safety/equipment;RW;Ambulation Toilet Transfer Details (indicate cue type and reason): able to walk around from one side of the bed to the other to recliner Toileting- Clothing Manipulation and Hygiene: Minimal assistance;Sitting/lateral lean;Sit to/from stand       Functional mobility during ADLs: Minimal assistance;+2 for physical assistance;+2 for safety/equipment;Rolling walker;Cueing for safety       Vision Patient Visual Report: No change from baseline       Perception     Praxis      Pertinent Vitals/Pain Pain Assessment: Faces Faces Pain Scale: Hurts  little more Pain Location: R leg with mobility Pain Descriptors / Indicators: Aching;Sore Pain Intervention(s): Limited activity within patient's tolerance;Monitored during session;Repositioned     Hand Dominance     Extremity/Trunk Assessment Upper Extremity Assessment Upper Extremity Assessment: Generalized weakness   Lower  Extremity Assessment Lower Extremity Assessment: Generalized weakness;Defer to PT evaluation       Communication Communication Communication: No difficulties   Cognition Arousal/Alertness: Awake/alert Behavior During Therapy: WFL for tasks assessed/performed Overall Cognitive Status: Within Functional Limits for tasks assessed                                     General Comments       Exercises     Shoulder Instructions      Home Living Family/patient expects to be discharged to:: Private residence Living Arrangements: Spouse/significant other Available Help at Discharge: Friend(s);Available 24 hours/day Type of Home: House Home Access: Level entry     Home Layout: One level     Bathroom Shower/Tub: Teacher, early years/pre: Standard     Home Equipment: Shower seat   Additional Comments: on 2L O2 at baseline       Prior Functioning/Environment Level of Independence: Independent        Comments: independent ADLs, friend assist with IADLs        OT Problem List: Decreased strength;Decreased activity tolerance;Impaired balance (sitting and/or standing);Decreased knowledge of use of DME or AE;Decreased knowledge of precautions;Cardiopulmonary status limiting activity;Pain      OT Treatment/Interventions: Self-care/ADL training;Therapeutic exercise;DME and/or AE instruction;Therapeutic activities;Patient/family education;Balance training    OT Goals(Current goals can be found in the care plan section) Acute Rehab OT Goals Patient Stated Goal: return to independence- do whatever rehab needed prior to returning home OT Goal Formulation: With patient Time For Goal Achievement: 12/08/20 Potential to Achieve Goals: Good  OT Frequency: Min 2X/week   Barriers to D/C:            Co-evaluation              AM-PAC OT "6 Clicks" Daily Activity     Outcome Measure Help from another person eating meals?: A Little Help from another  person taking care of personal grooming?: A Little Help from another person toileting, which includes using toliet, bedpan, or urinal?: A Lot Help from another person bathing (including washing, rinsing, drying)?: A Lot Help from another person to put on and taking off regular upper body clothing?: A Little Help from another person to put on and taking off regular lower body clothing?: A Lot 6 Click Score: 15   End of Session Equipment Utilized During Treatment: Oxygen Nurse Communication: Mobility status  Activity Tolerance: Patient tolerated treatment well Patient left: in chair;with call bell/phone within reach;with chair alarm set  OT Visit Diagnosis: Other abnormalities of gait and mobility (R26.89);Other (comment);Muscle weakness (generalized) (M62.81);Pain Pain - Right/Left: Right Pain - part of body: Leg;Hip                Time: 1610-9604 OT Time Calculation (min): 28 min Charges:  OT General Charges $OT Visit: 1 Visit OT Evaluation $OT Eval Moderate Complexity: [place], MSOT, OTR/L Acute Rehabilitation Services [facility] Office Number: [phone] Pager: [phone]  Zenovia Jarred 11/24/2020, 11:56 AM

## 2020-11-24 NOTE — Progress Notes (Addendum)
[facility] Progress Note   Subjective:   Patient seen and examined at bedside.  No specific complaints.  Reports dialysis went well yesterday.  Pain currently well controlled.  Denies SOB, CP, n/v/d, edema and abdominal pain.   Objective Vitals:   11/23/20 1623 11/23/20 2123 11/24/20 0502 11/24/20 0847  BP: 133/89 (!) 90/43 (!) 109/49 117/65  Pulse: 84 81 75 75  Resp: 20 17 20 18   Temp: 98.5 F (36.9 C) 98.4 F (36.9 C) 97.9 F (36.6 C) 98.5 F (36.9 C)  TempSrc: Oral Oral Oral Oral  SpO2: 100% 96% 100% 100%  Weight:  54.4 kg    Height:       Physical Exam General: Thin elderly female in NAD Heart:RRR, no mrg Lungs:CTAB, nml WOB on 2L via  Abdomen:soft, NTND Extremities:no LE edema Dialysis Access: LU AVF +b/t, [facility]   Filed Weights   11/23/20 1155 11/23/20 1540 11/23/20 2123  Weight: 54.7 kg 54.5 kg 54.4 kg    Intake/Output Summary (Last 24 hours) at 11/24/2020 1029 Last data filed at 11/24/2020 0600 Gross per 24 hour  Intake 680 ml  Output 1000 ml  Net -320 ml    Additional Objective Labs: Basic Metabolic Panel: Recent Labs  Lab 11/22/20 0249 11/23/20 0502 11/24/20 0400  NA 134* 133* 133*  K 4.3 6.1* 3.9  CL 96* 95* 95*  CO2 26 24 26   GLUCOSE 107* 120* 133*  BUN 5* 20 14  CREATININE 1.82* 3.91* 2.33*  CALCIUM 7.7* 8.8* 8.2*   Liver Function Tests: Recent Labs  Lab 11/18/20 1338 11/20/20 1240 11/22/20 0249  AST 25 34 29  ALT 18 16 12   ALKPHOS 163* 175* 130*  BILITOT 0.5 0.9 0.7  PROT 6.9 6.5 5.0*  ALBUMIN 2.1* 2.2* 1.6*   CBC: Recent Labs  Lab 11/18/20 1338 11/18/20 1338 11/20/20 1240 11/21/20 0117 11/22/20 0249 11/23/20 0502 11/24/20 0400  WBC 33.8*   < > 34.8* 30.5* 22.9* 25.6* 24.4*  NEUTROABS 30.5*  --   --   --   --   --   --   HGB 10.3*   < > 10.6* 10.0* 8.3* 8.7* 8.4*  HCT 34.4*  --  33.1* 32.1* 27.2* 28.0* 25.7*  MCV 88.7  --  89.0 87.2 89.5 88.6 88.3  PLT 412*   < > 448* 367 302 324 363   < > = values  in this interval not displayed.   Studies/Results: Pelvis Portable  Result Date: 11/22/2020 CLINICAL DATA:  79 year old female status post hip replacement following pathologic fracture secondary to metastatic cancer. EXAM: PORTABLE PELVIS 1-2 VIEWS COMPARISON:  Intraoperative images 1359 hours today. FINDINGS: Portable AP view at 1552 hours. Bipolar right hip arthroplasty. Hardware appears intact with normal AP alignment. No unexpected osseous changes. Regional postoperative soft tissue gas. Lytic lesion in the superior left femoral neck redemonstrated. Small volume retained oral contrast in the descending colon. Calcified uterine fibroid. IMPRESSION: 1. Bipolar right hip arthroplasty with no adverse features. 2. Proximal left femoral lytic metastasis. Electronically Signed   By: Genevie Ann M.D.   On: 11/22/2020 16:47   DG C-Arm 1-60 Min  Result Date: 11/22/2020 CLINICAL DATA:  Right hip hemiarthroplasty. EXAM: DG C-ARM 1-60 MIN; OPERATIVE RIGHT HIP WITH PELVIS FLUOROSCOPY TIME:  Fluoroscopy Time:  6.9 seconds COMPARISON:  November 18, 2020. FINDINGS: Four C-arm fluoroscopic images were obtained intraoperatively and submitted for post operative interpretation. These images demonstrate surgical changes of right hip arthroplasty. Please see the performing provider's procedural  report for further detail. IMPRESSION: Intraoperative fluoroscopic images, as detailed above. Electronically Signed   By: Margaretha Sheffield MD   On: 11/22/2020 15:17   DG HIP OPERATIVE UNILAT WITH PELVIS RIGHT  Result Date: 11/22/2020 CLINICAL DATA:  Right hip hemiarthroplasty. EXAM: DG C-ARM 1-60 MIN; OPERATIVE RIGHT HIP WITH PELVIS FLUOROSCOPY TIME:  Fluoroscopy Time:  6.9 seconds COMPARISON:  November 18, 2020. FINDINGS: Four C-arm fluoroscopic images were obtained intraoperatively and submitted for post operative interpretation. These images demonstrate surgical changes of right hip arthroplasty. Please see the performing  provider's procedural report for further detail. IMPRESSION: Intraoperative fluoroscopic images, as detailed above. Electronically Signed   By: Margaretha Sheffield MD   On: 11/22/2020 15:17    Medications: . sodium chloride    . sodium chloride Stopped (11/23/20 0403)  . [START ON 11/27/2020] iron sucrose    . methocarbamol (ROBAXIN) IV     . (feeding supplement) PROSource Plus  30 mL Oral BID BM  . amLODipine  10 mg Oral Daily  . bupivacaine liposome  20 mL Infiltration Once  . carvedilol  12.5 mg Oral BID WC  . Chlorhexidine Gluconate Cloth  6 each Topical Daily  . dexamethasone (DECADRON) injection  8 mg Intravenous Q6H  . docusate sodium  100 mg Oral BID  . enoxaparin (LOVENOX) injection  30 mg Subcutaneous Q24H  . feeding supplement (NEPRO CARB STEADY)  237 mL Oral BID BM  . irbesartan  150 mg Oral Daily  . multivitamin  1 tablet Oral QHS  . rosuvastatin  10 mg Oral Daily  . tranexamic acid (CYKLOKAPRON) topical - INTRAOP  2,000 mg Topical Once    Dialysis Orders: [place] on MWF,4 hoursEDW 48.5 kg, [place] 2.0 potassium 2.0 calcium, 2000 units heparin. Venofer 50 mg, no ESA secondary to cancer, Access =left BC aVF placed 08/07/2020, right IJ PermCath  Assessment/Plan: 1. Hyperkalemia2/34missed dialysis &dietary indiscretions. K 6.1 pre HD yesterday, improved to 3.9 post HD. 2. ESRD-on HD MWF. Off schedule last week due to planned hip surgery.  Resume regular schedule on Monday. Plan to use AVF with HD tomorrow. Was ready for use in 11/07/2020. 3. Hypertension/volume- BP slightly low, onamlodipine 10 mg daily, Avapro 150 mg daily, Coreg 12.5 mg twice daily. Will decrease amlodipine to 5mg  qd.Does not appear grossly volume overloaded. Close to EDW, UF as tolerated.  4. Anemia -Hgbstable at 8.4.No ESA d/t malignancy. Continue weekly iron. Transfuse if Hgb<7. 5. Metabolic bone disease -CCa and phos in goal. Not on binders or VDRA. Follow labs.  6. Right femoral  neck fracture- MRI hip showed impacted subcapital R femoral neck frx.  Status post 12/10 right hip hemiarthroplasty, plan  Per ortho.  7. Recent diagnosed lung cancer/Metastatic cancer=followed by Dr. Earlie Server oncology per patient has appointment December 14 to discuss. To start radiation soon. MRI brain 12/09` showed 6 intracranial lesions likely reflecting metastases. Edema associated with larger lesions but no significant mass effect. Per admit. 8. Leukocytosis possible reactive in nature, Afebrile, work-up per admit     Jen Mow, PA-C [facility] 11/24/2020,10:29 AM  LOS: 4 days

## 2020-11-24 NOTE — Progress Notes (Addendum)
Inpatient Rehab Admissions Coordinator Note:   Per therapy recommendations, pt was screened for CIR candidacy by Clemens Catholic, [place] CCC-SLP. At this time, Pt. Appears to have functional decline and is a good candidate for CIR. Will request order for rehab consult per protocol.  Please contact me with questions.   Clemens Catholic, [place], [place] Admissions Coordinator  [phone] ([place]) [phone] (office)

## 2020-11-24 NOTE — Evaluation (Signed)
Physical Therapy Evaluation Patient Details Name: Cassandra Dyer MRN: [id number] DOB: Sep 24, 1941 Today's Date: 11/24/2020   History of Present Illness  79 y.o. female admitted with weakness, and missed HD session due to Rt hip pain.  CXR  on 12/6 showed minimally displaced Rt femoral neck fracture concerning for pathological fx, and was waiting on appointment with orthopedist.  MRI showed pathological fractures bil. femoral necks. SHe underwent THA, direct anterior approach Rt hip.  PMH includes: HTN, HLD, recent dx Lung CA, remote h/o renal cell CA, COPD on 2L 02, ESRD on HD    Clinical Impression  Pt admitted with above diagnosis. PTA pt independent with mobility and ADLs. On eval, she required min assist bed mobility, +2 min assist sit to stand, and min assist +2 safety/equip ambulation 15' with RW. Pt on 2L O2 throughout session. Pt currently with functional limitations due to the deficits listed below. Pt will benefit from skilled PT to increase their independence and safety with mobility to allow discharge to the venue listed below.       Follow Up Recommendations CIR    Equipment Recommendations  Rolling walker with 5" wheels;3in1 (PT)    Recommendations for Other Services Rehab consult     Precautions / Restrictions Precautions Precautions: Fall Restrictions Weight Bearing Restrictions: Yes RLE Weight Bearing: Weight bearing as tolerated      Mobility  Bed Mobility Overal bed mobility: Needs Assistance Bed Mobility: Supine to Sit     Supine to sit: Min assist;HOB elevated     General bed mobility comments: +rail, increased time and effort, assist to scoot to EOB    Transfers Overall transfer level: Needs assistance Equipment used: Rolling walker (2 wheeled) Transfers: Sit to/from Stand Sit to Stand: Min assist;+2 physical assistance;+2 safety/equipment         General transfer comment: cues for hand placement, assist to power up and stabilize  balance  Ambulation/Gait Ambulation/Gait assistance: Min assist;+2 safety/equipment Gait Distance (Feet): 15 Feet Assistive device: Rolling walker (2 wheeled) Gait Pattern/deviations: Step-to pattern;Decreased stride length;Antalgic Gait velocity: decreased Gait velocity interpretation: <1.8 ft/sec, indicate of risk for recurrent falls General Gait Details: Cues for posture and sequencing, assist with RW management and balance, +2 for safety/lines  Stairs            Wheelchair Mobility    Modified Rankin (Stroke Patients Only)       Balance Overall balance assessment: Needs assistance Sitting-balance support: Feet supported;No upper extremity supported Sitting balance-Leahy Scale: Good     Standing balance support: Bilateral upper extremity supported;During functional activity Standing balance-Leahy Scale: Poor Standing balance comment: reliant on external support                             Pertinent Vitals/Pain Pain Assessment: Faces Faces Pain Scale: Hurts little more Pain Location: R leg with mobility Pain Descriptors / Indicators: Grimacing;Discomfort;Sore Pain Intervention(s): Limited activity within patient's tolerance;Repositioned;Monitored during session    [place] expects to be discharged to:: Private residence Living Arrangements: Spouse/significant other Available Help at Discharge: Friend(s);Available 24 hours/day Type of Home: House Home Access: Level entry     Home Layout: One level Home Equipment: Shower seat Additional Comments: on 2L O2 at baseline     Prior Function Level of Independence: Independent         Comments: independent ADLs, friend assist with IADLs     Hand Dominance   Dominant Hand: Right  Extremity/Trunk Assessment   Upper Extremity Assessment Upper Extremity Assessment: Generalized weakness    Lower Extremity Assessment Lower Extremity Assessment: Generalized weakness;RLE  deficits/detail RLE Deficits / Details: s/p hemiarthroplasty (anterior approach)    Cervical / Trunk Assessment Cervical / Trunk Assessment: Normal  Communication   Communication: No difficulties  Cognition Arousal/Alertness: Awake/alert Behavior During Therapy: WFL for tasks assessed/performed Overall Cognitive Status: Within Functional Limits for tasks assessed                                 General Comments: very motivated      General Comments General comments (skin integrity, edema, etc.): VSS on 2L [place]    Exercises     Assessment/Plan    PT Assessment Patient needs continued PT services  PT Problem List Cardiopulmonary status limiting activity;Decreased strength;Decreased balance;Decreased activity tolerance;Decreased mobility;Decreased knowledge of use of DME;Decreased knowledge of precautions;Pain       PT Treatment Interventions Gait training;DME instruction;Therapeutic activities;Functional mobility training;Therapeutic exercise;Balance training;Patient/family education    PT Goals (Current goals can be found in the Care Plan section)  Acute Rehab PT Goals Patient Stated Goal: return to independence- do whatever rehab needed prior to returning home PT Goal Formulation: With patient Time For Goal Achievement: 12/08/20 Potential to Achieve Goals: Good    Frequency Min 5X/week   Barriers to discharge        Co-evaluation               AM-PAC PT "6 Clicks" Mobility  Outcome Measure Help needed turning from your back to your side while in a flat bed without using bedrails?: A Little Help needed moving from lying on your back to sitting on the side of a flat bed without using bedrails?: A Little Help needed moving to and from a bed to a chair (including a wheelchair)?: A Little Help needed standing up from a chair using your arms (e.g., wheelchair or bedside chair)?: A Little Help needed to walk in hospital room?: A Little Help needed  climbing 3-5 steps with a railing? : A Lot 6 Click Score: 17    End of Session Equipment Utilized During Treatment: Gait belt;Oxygen Activity Tolerance: Patient tolerated treatment well Patient left: in chair;with call bell/phone within reach Nurse Communication: Mobility status PT Visit Diagnosis: Other abnormalities of gait and mobility (R26.89);Pain;Difficulty in walking, not elsewhere classified (R26.2);Muscle weakness (generalized) (M62.81) Pain - Right/Left: Right Pain - part of body: Hip    Time: 9767-3419 PT Time Calculation (min) (ACUTE ONLY): 34 min   Charges:   PT Evaluation $PT Eval Moderate Complexity: 1 Mod PT Treatments $Gait Training: 8-22 mins        Lorrin Goodell, PT  Office # [phone] Pager [phone]   Lorriane Shire 11/24/2020, 12:50 PM

## 2020-11-24 NOTE — Progress Notes (Signed)
  Subjective: Patient stable.  Right hip has pain but patient is motivated to get up and around   Objective: Vital signs in last 24 hours: Temp:  [97.9 F (36.6 C)-98.5 F (36.9 C)] 98.5 F (36.9 C) (12/12 0847) Pulse Rate:  [75-84] 75 (12/12 0847) Resp:  [17-30] 18 (12/12 0847) BP: (90-142)/(43-102) 117/65 (12/12 0847) SpO2:  [96 %-100 %] 100 % (12/12 0847) Weight:  [54.4 kg-54.7 kg] 54.4 kg (12/11 2123)  Intake/Output from previous day: 12/11 0701 - 12/12 0700 In: 940 [P.O.:940] Out: 1000  Intake/Output this shift: No intake/output data recorded.  Exam:  Sensation intact distally Dorsiflexion/Plantar flexion intact  Labs: Recent Labs    11/22/20 0249 11/23/20 0502 11/24/20 0400  HGB 8.3* 8.7* 8.4*   Recent Labs    11/23/20 0502 11/24/20 0400  WBC 25.6* 24.4*  RBC 3.16* 2.91*  HCT 28.0* 25.7*  PLT 324 363   Recent Labs    11/23/20 0502 11/24/20 0400  NA 133* 133*  K 6.1* 3.9  CL 95* 95*  CO2 24 26  BUN 20 14  CREATININE 3.91* 2.33*  GLUCOSE 120* 133*  CALCIUM 8.8* 8.2*   No results for input(s): LABPT, INR in the last 72 hours.  Assessment/Plan: Plan at this time is to work with physical therapy to improve mobilization.  Anticipate skilled nursing home discharge this week.  Creatinine is improving   G Alphonzo Severance 11/24/2020, 11:12 AM

## 2020-11-24 NOTE — Progress Notes (Signed)
PROGRESS NOTE    ALLESHA Dyer  BWI:[id number] DOB: 1941/12/04 DOA: 11/20/2020 PCP: Biagio Borg, MD   Brief Narrative:  Cassandra Dyer ADMITTING MD: Leane Para a 79 y.o.femalewith medical history significant ofhypertension, hyperlipidemia,recent diagnosis oflung cancer;remote h/orenal cell carcinoma;COPD on 2L home O2;and ESRD on HD presented to hospital with generalized weakness, missed HD.She also complained of right hip pain and ambulatory dysfunction. She was unable to go to hemodialysis because of pain. She also complained of generalized weakness, poor appetite and weight loss. Of note, patient was previously hospitalized from 11/8-24 after missed HD x 1. She presented with acute on chronic hypoxic respiratory failure and underwent thoracentesis x 3. 2DEcho was performed with grade 2 diastolic dysfunction. She was found to have a L hilar mass and bronchoscopy with endobronchial [place] was performed. She was subsequently diagnosed with stage IV (T3, N2, M1a) NSCLC. MRI brain and PET scan were ordered. She was seen by Rad Onc on 12/2 with the plan for palliative radiation. She was seen again by the oncology PA on 12/6 with c/o SOB and R hip pain. CXR showed stable pleural effusions and R hip film with minimally displaced R femoral neck fracture concerning for pathologic fracture. She was told to stay off the hip and has been in a wheelchair since. In the ED,patient was noted to be mildly hyperkalemic. Nephrology was consulted for hemodialysis. She had missed hemodialysis on Monday and Wednesday. Patient was then admitted hospital for further evaluation and treatment.Orthopedics was also consulted for possible surgical intervention.   Assessment & Plan:   Principal Problem:   Closed displaced fracture of right femoral neck (HCC) Active Problems:   Essential hypertension   Dyslipidemia   Hyperkalemia, diminished renal excretion   COPD (chronic obstructive  pulmonary disease) (HCC)   Adenocarcinoma of left lung, stage 4 (HCC)   Noncompliance of patient with renal dialysis ([place])   Acute right hip pain with pathological femoral neck fracture. [place] board.MRI of the hip showed impacted subcapital right femoral neck pathological fracture. POD 2 R THA Continue with postop hip fracture protocol  ESRD on hemodialysis, missed hemodialysis  Nephrologyon board forhemodialysis, had mild hyperkalemia on presentation. Had missed 2 sessions of hemodialysisas outpatient.Continue hemodialysis as per nephrology. Next dialysis tomorrow  Hyperkalemia -6.1, ND planned for today  Stage 4 lung cancer, recent diagnosis. Patientissupposed to start radiation soon. MRI of the brainwithout contrast with 6 metastatic lesions with edema around largest oneChest x-ray done showed cardiomegaly with central vascular congestion and new confluent patchy bilateral opacities. Respiratory panel was negative for Covid and influenza.Added Dr. Otho Perl the epic chat  Right upper extremity twitching Unclear if it was secondary to electrolyte abnormalities associated with end-stage renal disease versus edema from brain tumor Initiated treatment Decadron yesterday-symptoms have improved Repeat MRI in the morning to evaluate brain edema Will likely need to discuss with neurology for further recommendations Will continue decadron today given RUE improvement and follow-up MRI results  Essential hypertension Continue Coreg, Micardis. DiscontinuedToprol-XL. IV hydralazine as needed hypertension.Blood pressure 113/57 at this time.  Hyperlipidemia -Continue Crestor  COPD with chronic hypoxic respiratory failure. On 2 L of oxygen by nasal cannula. Continue bronchodilators.  Significant leukocytosis. Possible reactive in nature. Uncertain etiology. No fever. Patient has had WBC elevation for more than 2 weeks now. LatestWBC of 25.6 .  T-max of 99.5.  DVT prophylaxis: SCD/Compression stockings  Code Status: DNR    Code Status Orders  (From admission, onward)  Start     Ordered   11/20/20 1624  Do not attempt resuscitation (DNR)  Continuous       Question Answer Comment  In the event of cardiac or respiratory ARREST Do not call a code blue   In the event of cardiac or respiratory ARREST Do not perform Intubation, CPR, defibrillation or ACLS   In the event of cardiac or respiratory ARREST Use medication by any route, position, wound care, and other measures to relive pain and suffering. May use oxygen, suction and manual treatment of airway obstruction as needed for comfort.      11/20/20 1627        Code Status History    Date Active Date Inactive Code Status Order ID Comments User Context   10/22/2020 0048 11/06/2020 2144 Full Code [id number]  Rise Patience, MD ED   09/10/2020 1310 09/12/2020 1725 Full Code [id number]  Mckinley Jewel, MD ED   07/27/2020 0535 08/08/2020 2121 DNR [id number]  Vernelle Emerald, MD Inpatient   07/27/2020 0512 07/27/2020 0535 DNR [id number]  Vernelle Emerald, MD Inpatient   04/08/2020 1503 04/11/2020 2314 Full Code [id number]  Norval Morton, MD ED   01/13/2017 1342 01/14/2017 2150 Full Code [id number]  Lattie Corns Inpatient   Advance Care Planning Activity     Family Communication: Tried calling pt contact, NA LM Disposition Plan: Patient remained inpatient for an additional day follow-up MRI, follow-up dialysis, monitor electrolytes and replete as necessary.  Patient would like to care for self at home.  Will likely need placement.  Not medically stable for discharge Consults called: None Admission status: Inpatient   Consultants:   Nephrology  Procedures:  DG Chest 1 View  Result Date: 10/31/2020 CLINICAL DATA:  Status post thoracentesis EXAM: CHEST  1 VIEW COMPARISON:  July 30, 2020. FINDINGS: Left pleural effusion smaller following thoracentesis. Small  pleural effusion on each side. No pneumothorax. There is slight bibasilar atelectasis. Heart size and pulmonary vascular normal. No adenopathy. Central catheter tip is at the cavoatrial junction. There is aortic atherosclerosis. No bone lesions. IMPRESSION: Small pleural effusion currently on each side with mild bibasilar atelectasis. No pneumothorax. No consolidation. Stable cardiac silhouette. Aortic Atherosclerosis (ICD10-I70.0). Electronically Signed   By: Lowella Grip III M.D.   On: 10/31/2020 12:21   DG Chest 1 View  Result Date: 10/25/2020 CLINICAL DATA:  79 year old female with a history of thoracentesis EXAM: CHEST  1 VIEW COMPARISON:  10/24/2020, 10/23/2020 FINDINGS: Cardiomediastinal silhouette unchanged in size and contour with the heart borders partially obscured by overlying lung and pleural disease. Decreasing interlobular septal thickening. Unchanged right IJ tunneled hemodialysis catheter. Improved opacity at the right lung base with minimal blunting at the right costophrenic angle. No pneumothorax. Opacity at the left lung base is increased from the most recent comparison, though not as severe as the study dated 10/23/2020. No pneumothorax on the left. IMPRESSION: Decreased right-sided pleural effusion with no pneumothorax status post right thoracentesis. Recurrent left-sided pleural effusion and associated atelectasis/consolidation. Unchanged right IJ tunneled hemodialysis catheter. Improving pulmonary edema. Electronically Signed   By: Corrie Mckusick D.O.   On: 10/25/2020 16:40   DG Chest 2 View  Result Date: 11/20/2020 CLINICAL DATA:  cp EXAM: CHEST - 2 VIEW COMPARISON:  11/18/2020 FINDINGS: Tunneled right IJ CVC tip overlies the superior cavoatrial junction. Cardiomegaly with central pulmonary vascular congestion. Redemonstration of small bilateral pleural effusions. New patchy bilateral pulmonary opacities. Multilevel spondylosis. IMPRESSION: 1. Cardiomegaly with central pulmonary  vascular congestion. Redemonstration of small bilateral pleural effusions. 2. New confluent/patchy bilateral pulmonary opacities. Differential includes edema versus infection. Electronically Signed   By: Primitivo Gauze M.D.   On: 11/20/2020 13:29   CT ANGIO CHEST PE W OR WO CONTRAST  Result Date: 11/02/2020 CLINICAL DATA:  Respiratory failure EXAM: CT ANGIOGRAPHY CHEST WITH CONTRAST TECHNIQUE: Multidetector CT imaging of the chest was performed using the standard protocol during bolus administration of intravenous contrast. Multiplanar CT image reconstructions and MIPs were obtained to evaluate the vascular anatomy. CONTRAST:  87mL OMNIPAQUE IOHEXOL 350 MG/ML SOLN COMPARISON:  CT abdomen pelvis, 04/08/2020, 08/05/2017 FINDINGS: Cardiovascular: Satisfactory opacification of the pulmonary arteries. No evidence of pulmonary embolism. The left lower lobar pulmonary arteries are nearly occluded by a left hilar mass. Normal heart size. Left coronary artery calcifications. No pericardial effusion. Aortic atherosclerosis. Mediastinum/Nodes: Numerous enlarged left hilar and mediastinal lymph nodes, with a left hilar mass. Largest discretely measurable lymph nodes measure up to 2.1 x 1.4 cm (series 8, image 56). Thyroid gland, trachea, and esophagus demonstrate no significant findings. Lungs/Pleura: Mild centrilobular emphysema. Small to moderate, left greater than right pleural effusions and associated atelectasis or consolidation. There is a left hilar mass measuring approximately 5.2 x 4.0 cm, which obstructs the left lower lobar bronchus near the origin (series 9, image 208). There is a subpleural nodule of the posterior left apex measuring 1.4 x 1.3 cm (series 8, image 31) and an additional subpleural nodule of the high medial apex measuring 0.9 x 0.6 cm (series 8, image 25). Upper Abdomen: Partially imaged left adrenal nodule, not obviously changed compared to prior examination dated 04/08/2020 measuring 1.5 x  1.0 cm (series 9, image 39). Musculoskeletal: No chest wall abnormality. No acute or significant osseous findings. Review of the MIP images confirms the above findings. IMPRESSION: 1. Negative examination for pulmonary embolism. 2. There is a left hilar mass measuring approximately 5.2 x 4.0 cm, which obstructs the left lower lobar bronchus near the origin as well as the left lower lobar pulmonary arteries. 3. Subpleural nodules of the left pulmonary apex. 4. Numerous enlarged left hilar and mediastinal lymph nodes. 5. Constellation of findings is consistent primary lung malignancy and nodal and left-sided pulmonary metastatic disease. 6. Small to moderate, left greater than right pleural effusions and associated atelectasis or consolidation. Left pleural effusion is presumed malignant given presence of subpleural pulmonary nodules, there is no direct evidence of right sided malignant effusion or metastatic disease. 7. Partially imaged left adrenal nodule, not obviously changed compared to prior examinations and previously better characterized as a benign adenoma. Aortic Atherosclerosis (ICD10-I70.0) and Emphysema (ICD10-J43.9). Electronically Signed   By: Eddie Candle M.D.   On: 11/02/2020 19:11   MR BRAIN WO CONTRAST  Result Date: 11/21/2020 CLINICAL DATA:  Metastatic lung cancer, history of renal cell carcinoma EXAM: MRI HEAD WITHOUT CONTRAST TECHNIQUE: Multiplanar, multiecho pulse sequences of the brain and surrounding structures were obtained without intravenous contrast. COMPARISON:  None. FINDINGS: Brain: Suboptimal evaluation for metastatic disease without intravenous contrast. Several lesions are identified and annotated primarily on axial T2 series 6: 1.4 x 0.8 cm lesion of the left postcentral gyrus with moderate surrounding edema on series 6, image 20; 0.5 cm lesion of the left superior frontal gyrus on series 6, image 22; 0.7 cm lesion of the left superior frontal gyrus more inferiorly with minimal  edema on series 6, image 18; 2.6 x 1.7 cm lesion of the right lateral ventricle along the septum pellucidum on series  6, image 15; 0.5 cm lesion of the parasagittal left occipital lobe on series 6, image 13 with corresponding susceptibility reflecting chronic blood products or mineralization; 1 cm lesion of lateral left cerebellum with mild surrounding edema on series 7, image 6; Question of a small area of edema versus gliosis involving the lateral left precentral gyrus on series 7, image 16 without definite lesion. There is no acute infarction or intracranial hemorrhage. There is no hydrocephalus or extra-axial fluid collection. Prominence of the ventricles and sulci reflects minor generalized parenchymal volume loss. Vascular: Major vessel flow voids at the skull base are preserved. Skull and upper cervical spine: Normal marrow signal is preserved. Sinuses/Orbits: Trace mucosal thickening. Bilateral lens replacements. Other: Sella is unremarkable.  Mastoid air cells are clear. IMPRESSION: Six intracranial lesions are identified likely reflecting metastases. There is edema associated with the larger lesions but no significant mass effect. Question of a small area of edema versus gliosis in the posterior left frontal lobe without definite underlying lesion. Electronically Signed   By: Macy Mis M.D.   On: 11/21/2020 13:04   DG Chest Bilateral Decubitus  Result Date: 11/18/2020 CLINICAL DATA:  80 year old female with history of pleural effusions and thoracentesis, chest CT last month revealing left hilar mass. Shortness of breath. New right hip pain. EXAM: CHEST - BILATERAL DECUBITUS VIEW COMPARISON:  Portable chest 11/05/2020. Chest CTA 11/02/2020, and earlier. FINDINGS: Bilateral lateral decubitus views of the chest are provided. Both sides demonstrate layering pleural fluid, fairly symmetric with fluid to about 1.5 cm thickness on both sides. This is similar to the CTA chest 11/02/2020. The right side  non dependent costophrenic angle clears better than the left. Grossly stable mediastinal contours, and right chest dual lumen dialysis type catheter remains in place. IMPRESSION: Fairly symmetric bilateral layering pleural effusions, fluid volume likely not significantly changed from the CTA 11/02/2020. Electronically Signed   By: Genevie Ann M.D.   On: 11/18/2020 16:38   Pelvis Portable  Result Date: 11/22/2020 CLINICAL DATA:  79 year old female status post hip replacement following pathologic fracture secondary to metastatic cancer. EXAM: PORTABLE PELVIS 1-2 VIEWS COMPARISON:  Intraoperative images 1359 hours today. FINDINGS: Portable AP view at 1552 hours. Bipolar right hip arthroplasty. Hardware appears intact with normal AP alignment. No unexpected osseous changes. Regional postoperative soft tissue gas. Lytic lesion in the superior left femoral neck redemonstrated. Small volume retained oral contrast in the descending colon. Calcified uterine fibroid. IMPRESSION: 1. Bipolar right hip arthroplasty with no adverse features. 2. Proximal left femoral lytic metastasis. Electronically Signed   By: Genevie Ann M.D.   On: 11/22/2020 16:47   MR FEMUR RIGHT W WO CONTRAST  Result Date: 11/20/2020 CLINICAL DATA:  Fracture with metastatic disease EXAM: MRI OF THE RIGHT FEMUR WITHOUT AND WITH CONTRAST TECHNIQUE: Multiplanar, multisequence MR imaging of the right was performed both before and after administration of intravenous contrast. CONTRAST:  34mL GADAVIST GADOBUTROL 1 MMOL/ML IV SOLN COMPARISON:  None. FINDINGS: Bones/Joint/Cartilage There is a nondisplaced slightly impacted subcapital right femoral neck fracture as on the recent exam. There is a heterogeneous T1 isointense/heterogeneously T2 bright peripherally enhancing lesion within the medial right femoral neck. Mild surrounding marrow edema seen. No other fracture is identified. There is moderate bilateral hip osteoarthritis. Within the left femoral head/neck  there is also a T1 hypointense/T2 heterogeneous lesion. A moderate right hip joint effusion is seen. Ligaments The ligamentum teres appears to be grossly intact Muscles and Tendons There is edema seen within the adductor musculature  and iliopsoas musculature. The tendons appear to be grossly intact. Soft tissues Soft tissue swelling seen along the lateral aspect of the hip. Multiple hypointense uterine fibroids are seen. There is cystic lesion seen within the left adnexa which is partially visualized. IMPRESSION: Nondisplaced slightly impacted subcapital right femoral neck pathologic fracture. There is a osseous lesion within the right femoral neck, likely metastatic lesion . Would recommend nuclear medicine bone scan for further evaluation other osseous lesions. Osseous lesion within the left femoral head/neck concerning for metastatic lesion. Moderate right hip osteoarthritis and joint effusion Electronically Signed   By: Prudencio Pair M.D.   On: 11/20/2020 20:18   DG Chest Port 1 View  Result Date: 11/05/2020 CLINICAL DATA:  Patient status post left bronchoscopy today. EXAM: PORTABLE CHEST 1 VIEW COMPARISON:  Single-view of the chest 11/01/2019. FINDINGS: Negative for pneumothorax after bronchoscopy. Small bilateral pleural effusions and basilar atelectasis are worse on the left. Heart size is normal. Aortic atherosclerosis. Dialysis catheter is unchanged. IMPRESSION: Negative for pneumothorax after bronchoscopy. Small bilateral pleural effusions and basilar airspace disease, worse on the left. Electronically Signed   By: Inge Rise M.D.   On: 11/05/2020 12:17   DG CHEST PORT 1 VIEW  Result Date: 10/30/2020 CLINICAL DATA:  Shortness of breath EXAM: PORTABLE CHEST 1 VIEW COMPARISON:  October 28, 2020 FINDINGS: Central catheter tip is at the cavoatrial junction. No pneumothorax. There are pleural effusions bilaterally with bibasilar atelectasis. Heart is mildly enlarged with pulmonary vascularity  normal. No adenopathy. There is aortic atherosclerosis. No bone lesions. IMPRESSION: Central catheter as described without pneumothorax. Pleural effusions bilaterally with bibasilar atelectasis. It should be noted that superimposed pneumonia in the bases cannot be excluded by radiography. No new opacity appreciable. Stable cardiac silhouette. Aortic Atherosclerosis (ICD10-I70.0). Electronically Signed   By: Lowella Grip III M.D.   On: 10/30/2020 08:15   DG CHEST PORT 1 VIEW  Result Date: 10/28/2020 CLINICAL DATA:  Dyspnea, end-stage renal disease EXAM: PORTABLE CHEST 1 VIEW COMPARISON:  10/25/2020 FINDINGS: Right internal jugular hemodialysis catheter is seen with its tip at the superior right atrium. Pulmonary insufflation is normal. Large left and small right pleural effusions are present and have enlarged in size since prior examination. Superimposed mild interstitial pulmonary edema has progressed in the interval since prior examination. No pneumothorax. Cardiac size is at the upper limits of normal. No acute bone abnormality. IMPRESSION: Progressive cardiogenic failure or volume overload with progressive interstitial pulmonary infiltrate and enlarging bilateral pleural effusions, left greater than right. Electronically Signed   By: Fidela Salisbury MD   On: 10/28/2020 06:42   DG C-Arm 1-60 Min  Result Date: 11/22/2020 CLINICAL DATA:  Right hip hemiarthroplasty. EXAM: DG C-ARM 1-60 MIN; OPERATIVE RIGHT HIP WITH PELVIS FLUOROSCOPY TIME:  Fluoroscopy Time:  6.9 seconds COMPARISON:  November 18, 2020. FINDINGS: Four C-arm fluoroscopic images were obtained intraoperatively and submitted for post operative interpretation. These images demonstrate surgical changes of right hip arthroplasty. Please see the performing provider's procedural report for further detail. IMPRESSION: Intraoperative fluoroscopic images, as detailed above. Electronically Signed   By: Margaretha Sheffield MD   On: 11/22/2020 15:17    ECHOCARDIOGRAM COMPLETE  Result Date: 10/29/2020    ECHOCARDIOGRAM REPORT   Patient Name:   Cassandra Dyer Date of Exam: 10/29/2020 Medical Rec #:  [id number]       Height:       62.0 in Accession #:    [id number]      Weight:  121.3 lb Date of Birth:  1941/06/22       BSA:          1.545 m Patient Age:    67 years        BP:           113/56 mmHg Patient Gender: F               HR:           83 bpm. Exam Location:  Inpatient Procedure: 2D Echo, Cardiac Doppler and Color Doppler Indications:    Fluid Overload  History:        Patient has no prior history of Echocardiogram examinations.                 Risk Factors:Hypertension and Dyslipidemia.  Sonographer:    Bernadene Person RDCS Referring Phys: [id number]  1. Left ventricular ejection fraction, by estimation, is 60 to 65%. The left ventricle has normal function. The left ventricle has no regional wall motion abnormalities. Left ventricular diastolic parameters are consistent with Grade II diastolic dysfunction (pseudonormalization).  2. Right ventricular systolic function is normal. The right ventricular size is mildly enlarged. There is moderately elevated pulmonary artery systolic pressure. The estimated right ventricular systolic pressure is 24.5 mmHg.  3. The mitral valve is grossly normal. No evidence of mitral valve regurgitation. No evidence of mitral stenosis.  4. The aortic valve is abnormal. There is mild calcification of the aortic valve. Aortic valve regurgitation is not visualized. Mild aortic valve sclerosis is present, with no evidence of aortic valve stenosis.  5. The inferior vena cava is normal in size with greater than 50% respiratory variability, suggesting right atrial pressure of 3 mmHg. FINDINGS  Left Ventricle: Left ventricular ejection fraction, by estimation, is 60 to 65%. The left ventricle has normal function. The left ventricle has no regional wall motion abnormalities. The left ventricular internal  cavity size was normal in size. There is  no left ventricular hypertrophy. Left ventricular diastolic parameters are consistent with Grade II diastolic dysfunction (pseudonormalization). Right Ventricle: The right ventricular size is mildly enlarged. No increase in right ventricular wall thickness. Right ventricular systolic function is normal. There is moderately elevated pulmonary artery systolic pressure. The tricuspid regurgitant velocity is 3.50 m/s, and with an assumed right atrial pressure of 3 mmHg, the estimated right ventricular systolic pressure is 80.9 mmHg. Left Atrium: Left atrial size was normal in size. Right Atrium: Right atrial size was normal in size. Pericardium: There is no evidence of pericardial effusion. Mitral Valve: The mitral valve is grossly normal. No evidence of mitral valve regurgitation. No evidence of mitral valve stenosis. Tricuspid Valve: The tricuspid valve is normal in structure. Tricuspid valve regurgitation is mild . No evidence of tricuspid stenosis. Aortic Valve: The aortic valve is abnormal. There is mild calcification of the aortic valve. Aortic valve regurgitation is not visualized. Mild aortic valve sclerosis is present, with no evidence of aortic valve stenosis. Pulmonic Valve: The pulmonic valve was grossly normal. Pulmonic valve regurgitation is mild to moderate. No evidence of pulmonic stenosis. Aorta: The aortic root is normal in size and structure. Venous: The inferior vena cava is normal in size with greater than 50% respiratory variability, suggesting right atrial pressure of 3 mmHg. IAS/Shunts: No atrial level shunt detected by color flow Doppler. Additional Comments: A venous catheter is visualized in the right atrium. There is a moderate pleural effusion.  LEFT VENTRICLE PLAX 2D LVIDd:  4.17 cm  Diastology LVIDs:         2.21 cm  LV e' medial:    4.90 cm/s LV PW:         0.59 cm  LV E/e' medial:  22.0 LV IVS:        0.64 cm  LV e' lateral:   8.05 cm/s  LVOT diam:     2.10 cm  LV E/e' lateral: 13.4 LV SV:         90 LV SV Index:   58 LVOT Area:     3.46 cm  RIGHT VENTRICLE TAPSE (M-mode): 1.9 cm LEFT ATRIUM             Index       RIGHT ATRIUM           Index LA diam:        3.70 cm 2.39 cm/m  RA Area:     14.50 cm LA Vol (A2C):   49.5 ml 32.03 ml/m RA Volume:   36.30 ml  23.49 ml/m LA Vol (A4C):   46.9 ml 30.35 ml/m LA Biplane Vol: 50.1 ml 32.42 ml/m  AORTIC VALVE LVOT Vmax:   145.00 cm/s LVOT Vmean:  93.500 cm/s LVOT VTI:    0.259 m  AORTA Ao Root diam: 2.70 cm Ao Asc diam:  2.10 cm MITRAL VALVE                TRICUSPID VALVE MV Area (PHT): 2.91 cm     TR Peak grad:   49.0 mmHg MV Decel Time: 261 msec     TR Vmax:        350.00 cm/s MV E velocity: 108.00 cm/s MV A velocity: 124.00 cm/s  SHUNTS MV E/A ratio:  0.87         Systemic VTI:  0.26 m                             Systemic Diam: 2.10 cm Cassandra Kaiser MD Electronically signed by Cassandra Kaiser MD Signature Date/Time: 10/29/2020/4:59:56 PM    Final    DG Hip Unilat W or W/O Pelvis 1 View Right  Addendum Date: 11/18/2020   ADDENDUM REPORT: 11/18/2020 16:53 ADDENDUM: Study discussed by telephone with PA-C VAN TANNER on 11/18/2020 at 1645 hours. Electronically Signed   By: Genevie Ann M.D.   On: 11/18/2020 16:53   Result Date: 11/18/2020 CLINICAL DATA:  79 year old female with history of pleural effusions and thoracentesis, chest CT last month revealing left hilar mass. Shortness of breath. New right hip pain. EXAM: DG HIP (WITH OR WITHOUT PELVIS) 1V RIGHT COMPARISON:  CT Abdomen and Pelvis 04/08/2020. FINDINGS: There is a minimally displaced right femoral neck fracture. The fracture appears to extend from the superior mid neck toward the inferior subcapital neck region. The right femoral head remains normally located. The right femur intertrochanteric segment and visible proximal femoral shaft appear intact. Bone mineralization about the pelvis and proximal femurs does not appear significantly  changed from the April CT. No destructive osseous lesion identified radiographically. Pelvis and proximal left femur appear intact. Chronic calcified uterine fibroid again noted. Negative visible other lower abdominal and pelvic visceral contours. IMPRESSION: 1. Minimally displaced right femoral neck fracture, suspicious for pathologic fracture in this setting. 2. MRI versus whole-body bone scan would probably be most sensitive for detection of osseous metastatic disease. Electronically Signed: By: Genevie Ann M.D. On: 11/18/2020 16:41   DG  HIP OPERATIVE UNILAT WITH PELVIS RIGHT  Result Date: 11/22/2020 CLINICAL DATA:  Right hip hemiarthroplasty. EXAM: DG C-ARM 1-60 MIN; OPERATIVE RIGHT HIP WITH PELVIS FLUOROSCOPY TIME:  Fluoroscopy Time:  6.9 seconds COMPARISON:  November 18, 2020. FINDINGS: Four C-arm fluoroscopic images were obtained intraoperatively and submitted for post operative interpretation. These images demonstrate surgical changes of right hip arthroplasty. Please see the performing provider's procedural report for further detail. IMPRESSION: Intraoperative fluoroscopic images, as detailed above. Electronically Signed   By: Margaretha Sheffield MD   On: 11/22/2020 15:17   IR THORACENTESIS ASP PLEURAL SPACE W/IMG GUIDE  Result Date: 10/31/2020 INDICATION: Patient with end-stage renal disease, bilateral pleural effusions. Request is made for therapeutic thoracentesis. EXAM: ULTRASOUND GUIDED THERAPEUTIC RIGHT THORACENTESIS MEDICATIONS: 10 mL 1% lidocaine COMPLICATIONS: None immediate. PROCEDURE: An ultrasound guided thoracentesis was thoroughly discussed with the patient and questions answered. The benefits, risks, alternatives and complications were also discussed. The patient understands and wishes to proceed with the procedure. Written consent was obtained. Ultrasound was performed to localize and mark an adequate pocket of fluid in the right chest. The area was then prepped and draped in the normal  sterile fashion. 1% Lidocaine was used for local anesthesia. Under ultrasound guidance a 6 Fr Safe-T-Centesis catheter was introduced. Thoracentesis was performed. The catheter was removed and a dressing applied. FINDINGS: A total of approximately 650 mL of amber fluid was removed. Samples were sent to the laboratory as requested by the clinical team. IMPRESSION: Successful ultrasound guided therapeutic right thoracentesis yielding 650 mL of pleural fluid. Read by: Brynda Greathouse PA-C Electronically Signed   By: Jerilynn Mages.  Shick M.D.   On: 10/31/2020 16:13   IR THORACENTESIS ASP PLEURAL SPACE W/IMG GUIDE  Result Date: 10/25/2020 INDICATION: Shortness of breath. Right-sided pleural effusion. Request for therapeutic thoracentesis. EXAM: ULTRASOUND GUIDED RIGHT THORACENTESIS MEDICATIONS: 1% plain lidocaine, 5 mL COMPLICATIONS: None immediate. PROCEDURE: An ultrasound guided thoracentesis was thoroughly discussed with the patient and questions answered. The benefits, risks, alternatives and complications were also discussed. The patient understands and wishes to proceed with the procedure. Written consent was obtained. Ultrasound was performed to localize and mark an adequate pocket of fluid in the right chest. The area was then prepped and draped in the normal sterile fashion. 1% Lidocaine was used for local anesthesia. Under ultrasound guidance a 6 Fr Safe-T-Centesis catheter was introduced. Thoracentesis was performed. The catheter was removed and a dressing applied. FINDINGS: A total of approximately 700 mL of clear yellow fluid was removed. IMPRESSION: Successful ultrasound guided right thoracentesis yielding 700 mL of pleural fluid. Read by: Ascencion Dike PA-C Electronically Signed   By: Aletta Edouard M.D.   On: 10/25/2020 16:35   DG C-ARM BRONCHOSCOPY  Result Date: 11/05/2020 C-ARM BRONCHOSCOPY: Fluoroscopy was utilized by the requesting physician.  No radiographic interpretation.     Antimicrobials:    See MAR   Subjective: Patient reports right upper extremity nonpurposeful movements has resolved  Objective: Vitals:   11/23/20 1623 11/23/20 2123 11/24/20 0502 11/24/20 0847  BP: 133/89 (!) 90/43 (!) 109/49 117/65  Pulse: 84 81 75 75  Resp: 20 17 20 18   Temp: 98.5 F (36.9 C) 98.4 F (36.9 C) 97.9 F (36.6 C) 98.5 F (36.9 C)  TempSrc: Oral Oral Oral Oral  SpO2: 100% 96% 100% 100%  Weight:  54.4 kg    Height:        Intake/Output Summary (Last 24 hours) at 11/24/2020 1220 Last data filed at 11/24/2020 0600 Gross per 24  hour  Intake 680 ml  Output 1000 ml  Net -320 ml   Filed Weights   11/23/20 1155 11/23/20 1540 11/23/20 2123  Weight: 54.7 kg 54.5 kg 54.4 kg    Examination:  General exam: Appears calm and comfortable  Respiratory system: Clear to auscultation. Respiratory effort normal. Cardiovascular system: S1 & S2 heard, RRR. No JVD, murmurs, rubs, gallops or clicks. No pedal edema. Gastrointestinal system: Abdomen is nondistended, soft and nontender. No organomegaly or masses felt. Normal bowel sounds heard. Central nervous system: Alert and oriented. No new focal neurological deficits. Extremities: Warm well perfused, neurovascular intact Skin: No rashes, lesions or ulcers Psychiatry: Judgement and insight appear normal. Mood & affect labile    Data Reviewed: I have personally reviewed following labs and imaging studies  CBC: Recent Labs  Lab 11/18/20 1338 11/20/20 1240 11/21/20 0117 11/22/20 0249 11/23/20 0502 11/24/20 0400  WBC 33.8* 34.8* 30.5* 22.9* 25.6* 24.4*  NEUTROABS 30.5*  --   --   --   --   --   HGB 10.3* 10.6* 10.0* 8.3* 8.7* 8.4*  HCT 34.4* 33.1* 32.1* 27.2* 28.0* 25.7*  MCV 88.7 89.0 87.2 89.5 88.6 88.3  PLT 412* 448* 367 302 324 811   Basic Metabolic Panel: Recent Labs  Lab 11/20/20 1240 11/21/20 0117 11/22/20 0249 11/23/20 0502 11/24/20 0400  NA 134* 136 134* 133* 133*  K 6.8* 3.6 4.3 6.1* 3.9  CL 91* 95* 96* 95*  95*  CO2 25 24 26 24 26   GLUCOSE 83 94 107* 120* 133*  BUN 63* 15 5* 20 14  CREATININE 10.96* 3.93* 1.82* 3.91* 2.33*  CALCIUM 9.8 7.9* 7.7* 8.8* 8.2*  MG  --   --  1.8 2.0  --    GFR: Estimated Creatinine Clearance: 16 mL/min (A) (by C-G formula based on SCr of 2.33 mg/dL (H)). Liver Function Tests: Recent Labs  Lab 11/18/20 1338 11/20/20 1240 11/22/20 0249  AST 25 34 29  ALT 18 16 12   ALKPHOS 163* 175* 130*  BILITOT 0.5 0.9 0.7  PROT 6.9 6.5 5.0*  ALBUMIN 2.1* 2.2* 1.6*   No results for input(s): LIPASE, AMYLASE in the last 168 hours. No results for input(s): AMMONIA in the last 168 hours. Coagulation Profile: No results for input(s): INR, PROTIME in the last 168 hours. Cardiac Enzymes: No results for input(s): CKTOTAL, CKMB, CKMBINDEX, TROPONINI in the last 168 hours. BNP (last 3 results) No results for input(s): PROBNP in the last 8760 hours. HbA1C: No results for input(s): HGBA1C in the last 72 hours. CBG: Recent Labs  Lab 11/20/20 1746 11/22/20 1519  GLUCAP 141* 101*   Lipid Profile: No results for input(s): CHOL, HDL, LDLCALC, TRIG, CHOLHDL, LDLDIRECT in the last 72 hours. Thyroid Function Tests: No results for input(s): TSH, T4TOTAL, FREET4, T3FREE, THYROIDAB in the last 72 hours. Anemia Panel: No results for input(s): VITAMINB12, FOLATE, FERRITIN, TIBC, IRON, RETICCTPCT in the last 72 hours. Sepsis Labs: No results for input(s): PROCALCITON, LATICACIDVEN in the last 168 hours.  Recent Results (from the past 240 hour(s))  Resp Panel by RT-PCR (Flu A&B, Covid) Nasopharyngeal Swab     Status: None   Collection Time: 11/20/20  3:59 PM   Specimen: Nasopharyngeal Swab; Nasopharyngeal(NP) swabs in vial transport medium  Result Value Ref Range Status   SARS Coronavirus 2 by RT PCR NEGATIVE NEGATIVE Final    Comment: (NOTE) SARS-CoV-2 target nucleic acids are NOT DETECTED.  The SARS-CoV-2 RNA is generally detectable in upper respiratory specimens during  the  acute phase of infection. The lowest concentration of SARS-CoV-2 viral copies this assay can detect is 138 copies/mL. A negative result does not preclude SARS-Cov-2 infection and should not be used as the sole basis for treatment or other patient management decisions. A negative result may occur with  improper specimen collection/handling, submission of specimen other than nasopharyngeal swab, presence of viral mutation(s) within the areas targeted by this assay, and inadequate number of viral copies(<138 copies/mL). A negative result must be combined with clinical observations, patient history, and epidemiological information. The expected result is Negative.  Fact Sheet for Patients:  EntrepreneurPulse.com.au  Fact Sheet for Healthcare Providers:  IncredibleEmployment.be  This test is no t yet approved or cleared by the [place] FDA and  has been authorized for detection and/or diagnosis of SARS-CoV-2 by FDA under an Emergency Use Authorization (EUA). This EUA will remain  in effect (meaning this test can be used) for the duration of the COVID-19 declaration under Section 564(b)(1) of the Act, 21 U.S.C.section 360bbb-3(b)(1), unless the authorization is terminated  or revoked sooner.       Influenza A by PCR NEGATIVE NEGATIVE Final   Influenza B by PCR NEGATIVE NEGATIVE Final    Comment: (NOTE) The Xpert Xpress SARS-CoV-2/FLU/RSV plus assay is intended as an aid in the diagnosis of influenza from Nasopharyngeal swab specimens and should not be used as a sole basis for treatment. Nasal washings and aspirates are unacceptable for Xpert Xpress SARS-CoV-2/FLU/RSV testing.  Fact Sheet for Patients: EntrepreneurPulse.com.au  Fact Sheet for Healthcare Providers: IncredibleEmployment.be  This test is not yet approved or cleared by the [place] FDA and has been authorized for detection and/or  diagnosis of SARS-CoV-2 by FDA under an Emergency Use Authorization (EUA). This EUA will remain in effect (meaning this test can be used) for the duration of the COVID-19 declaration under Section 564(b)(1) of the Act, 21 U.S.C. section 360bbb-3(b)(1), unless the authorization is terminated or revoked.  Performed at [facility], [place] [address] [place], [place] [address]   Surgical pcr screen     Status: None   Collection Time: 11/21/20  8:22 PM   Specimen: Nasal Mucosa; Nasal Swab  Result Value Ref Range Status   MRSA, PCR NEGATIVE NEGATIVE Final   Staphylococcus aureus NEGATIVE NEGATIVE Final    Comment: (NOTE) The Xpert SA Assay (FDA approved for NASAL specimens in patients 53 years of age and older), is one component of a comprehensive surveillance program. It is not intended to diagnose infection nor to guide or monitor treatment. Performed at [facility], [place] [address] [place],  [address]          Radiology Studies: Pelvis Portable  Result Date: 11/22/2020 CLINICAL DATA:  79 year old female status post hip replacement following pathologic fracture secondary to metastatic cancer. EXAM: PORTABLE PELVIS 1-2 VIEWS COMPARISON:  Intraoperative images 1359 hours today. FINDINGS: Portable AP view at 1552 hours. Bipolar right hip arthroplasty. Hardware appears intact with normal AP alignment. No unexpected osseous changes. Regional postoperative soft tissue gas. Lytic lesion in the superior left femoral neck redemonstrated. Small volume retained oral contrast in the descending colon. Calcified uterine fibroid. IMPRESSION: 1. Bipolar right hip arthroplasty with no adverse features. 2. Proximal left femoral lytic metastasis. Electronically Signed   By: Genevie Ann M.D.   On: 11/22/2020 16:47   DG C-Arm 1-60 Min  Result Date: 11/22/2020 CLINICAL DATA:  Right hip hemiarthroplasty. EXAM: DG C-ARM 1-60 MIN; OPERATIVE RIGHT HIP WITH  PELVIS FLUOROSCOPY TIME:   Fluoroscopy Time:  6.9 seconds COMPARISON:  November 18, 2020. FINDINGS: Four C-arm fluoroscopic images were obtained intraoperatively and submitted for post operative interpretation. These images demonstrate surgical changes of right hip arthroplasty. Please see the performing provider's procedural report for further detail. IMPRESSION: Intraoperative fluoroscopic images, as detailed above. Electronically Signed   By: Margaretha Sheffield MD   On: 11/22/2020 15:17   DG HIP OPERATIVE UNILAT WITH PELVIS RIGHT  Result Date: 11/22/2020 CLINICAL DATA:  Right hip hemiarthroplasty. EXAM: DG C-ARM 1-60 MIN; OPERATIVE RIGHT HIP WITH PELVIS FLUOROSCOPY TIME:  Fluoroscopy Time:  6.9 seconds COMPARISON:  November 18, 2020. FINDINGS: Four C-arm fluoroscopic images were obtained intraoperatively and submitted for post operative interpretation. These images demonstrate surgical changes of right hip arthroplasty. Please see the performing provider's procedural report for further detail. IMPRESSION: Intraoperative fluoroscopic images, as detailed above. Electronically Signed   By: Margaretha Sheffield MD   On: 11/22/2020 15:17        Scheduled Meds:  (feeding supplement) PROSource Plus  30 mL Oral BID BM   amLODipine  10 mg Oral Daily   bupivacaine liposome  20 mL Infiltration Once   carvedilol  12.5 mg Oral BID WC   Chlorhexidine Gluconate Cloth  6 each Topical Daily   dexamethasone (DECADRON) injection  8 mg Intravenous Q6H   docusate sodium  100 mg Oral BID   enoxaparin (LOVENOX) injection  30 mg Subcutaneous Q24H   feeding supplement (NEPRO CARB STEADY)  237 mL Oral BID BM   irbesartan  150 mg Oral Daily   multivitamin  1 tablet Oral QHS   rosuvastatin  10 mg Oral Daily   tranexamic acid (CYKLOKAPRON) topical - INTRAOP  2,000 mg Topical Once   Continuous Infusions:  sodium chloride     sodium chloride Stopped (11/23/20 0403)   [START ON 11/27/2020] iron sucrose     methocarbamol (ROBAXIN)  IV       LOS: 4 days    Time spent: [place]    Nicolette Bang, MD [facility]  If 7PM-7AM, please contact night-coverage  11/24/2020, 12:20 PM

## 2020-11-25 ENCOUNTER — Encounter (HOSPITAL_COMMUNITY): Payer: Self-pay | Admitting: Orthopaedic Surgery

## 2020-11-25 ENCOUNTER — Other Ambulatory Visit: Payer: Self-pay | Admitting: Radiation Therapy

## 2020-11-25 DIAGNOSIS — M84551A Pathological fracture in neoplastic disease, right femur, initial encounter for fracture: Secondary | ICD-10-CM

## 2020-11-25 LAB — CBC
HCT: 26.1 % — ABNORMAL LOW (ref 36.0–46.0)
Hemoglobin: 8.5 g/dL — ABNORMAL LOW (ref 12.0–15.0)
MCH: 28 pg (ref 26.0–34.0)
MCHC: 32.6 g/dL (ref 30.0–36.0)
MCV: 85.9 fL (ref 80.0–100.0)
Platelets: 353 10*3/uL (ref 150–400)
RBC: 3.04 MIL/uL — ABNORMAL LOW (ref 3.87–5.11)
RDW: 15.9 % — ABNORMAL HIGH (ref 11.5–15.5)
WBC: 27.5 10*3/uL — ABNORMAL HIGH (ref 4.0–10.5)
nRBC: 0 % (ref 0.0–0.2)

## 2020-11-25 LAB — POTASSIUM: Potassium: 3.8 mmol/L (ref 3.5–5.1)

## 2020-11-25 NOTE — Progress Notes (Signed)
Physical Therapy Treatment Patient Details Name: Cassandra Dyer MRN: [id number] DOB: [birth year] Today's Date: 11/25/2020    History of Present Illness 79 y.o. female admitted with weakness, and missed HD session due to Rt hip pain.  CXR  on 12/6 showed minimally displaced Rt femoral neck fracture concerning for pathological fx, and was waiting on appointment with orthopedist.  MRI showed pathological fractures bil. femoral necks. SHe underwent THA, direct anterior approach Rt hip.  PMH includes: HTN, HLD, recent dx Lung CA, remote h/o renal cell CA, COPD on 2L 02, ESRD on HD    PT Comments    Pt progressing towards physical therapy goals. Was able to perform transfers and ambulation with gross min guard assist progressing to supervision for safety by end of session. Reliant on RW for support at times however was able to stand and clean herself after toileting without UE support. Based on performance today, feel this patient could manage at home with family support and HH therapies to follow. CIR admission coordinator updated after session. Will continue to follow.    Follow Up Recommendations  Home health PT;Supervision for mobility/OOB     Equipment Recommendations  Rolling walker with 5" wheels;3in1 (PT)    Recommendations for Other Services Rehab consult     Precautions / Restrictions Precautions Precautions: Fall Precaution Comments: watch O2 sats Restrictions Weight Bearing Restrictions: Yes RLE Weight Bearing: Weight bearing as tolerated    Mobility  Bed Mobility Overal bed mobility: Needs Assistance Bed Mobility: Supine to Sit     Supine to sit: HOB elevated;Supervision     General bed mobility comments: Utilized railing. Pt was able to scoot around to EOB without assistance, and minimal increased time.  Transfers Overall transfer level: Needs assistance Equipment used: Rolling walker (2 wheeled) Transfers: Sit to/from Stand Sit to Stand: Min guard;Supervision          General transfer comment: VC's for hand placement on seated surface for safety.Initially, min guard assist provided and progressed to supervision level by end of session.Pt stood from EOB and from [facility].  Ambulation/Gait Ambulation/Gait assistance: Supervision Gait Distance (Feet): 200 Feet Assistive device: Rolling walker (2 wheeled) Gait Pattern/deviations: Decreased stride length;Step-through pattern;Trunk flexed Gait velocity: decreased Gait velocity interpretation: 1.31 - 2.62 ft/sec, indicative of limited community ambulator General Gait Details: VC's for improved posture and closer walker proximity. Pt was able to make corrective changes but required occasional cues to maintain. Overall pt shifting weight to RLE well and no unsteadiness or LOB noted.   Stairs             Wheelchair Mobility    Modified Rankin (Stroke Patients Only)       Balance Overall balance assessment: Needs assistance Sitting-balance support: Feet supported;No upper extremity supported Sitting balance-Leahy Scale: Good     Standing balance support: Bilateral upper extremity supported;During functional activity Standing balance-Leahy Scale: Fair Standing balance comment: able to stand and clean herself at the commode without assistance.                            Cognition Arousal/Alertness: Awake/alert Behavior During Therapy: WFL for tasks assessed/performed Overall Cognitive Status: Within Functional Limits for tasks assessed                                        Exercises General Exercises - Lower Extremity  Long Arc Quad: 10 reps;Seated Hip ABduction/ADduction: 10 reps;Seated    General Comments General comments (skin integrity, edema, etc.): VSS on 3 L/min supplemental O2 (pt on 2.5 L/min wall unit upon entry).      Pertinent Vitals/Pain Pain Assessment: 0-10 Pain Score: 0-No pain Pain Location: "I don't have any pain." Pain  Intervention(s): Monitored during session    Home Living                      Prior Function            PT Goals (current goals can now be found in the care plan section) Acute Rehab PT Goals Patient Stated Goal: return to independence- do whatever rehab needed prior to returning home PT Goal Formulation: With patient Time For Goal Achievement: 12/08/20 Potential to Achieve Goals: Good Progress towards PT goals: Progressing toward goals    Frequency    Min 5X/week      PT Plan Discharge plan needs to be updated    Co-evaluation              AM-PAC PT "6 Clicks" Mobility   Outcome Measure  Help needed turning from your back to your side while in a flat bed without using bedrails?: A Little Help needed moving from lying on your back to sitting on the side of a flat bed without using bedrails?: A Little Help needed moving to and from a bed to a chair (including a wheelchair)?: A Little Help needed standing up from a chair using your arms (e.g., wheelchair or bedside chair)?: A Little Help needed to walk in hospital room?: A Little Help needed climbing 3-5 steps with a railing? : A Little 6 Click Score: 18    End of Session Equipment Utilized During Treatment: Gait belt;Oxygen Activity Tolerance: Patient tolerated treatment well Patient left: in chair;with call bell/phone within reach Nurse Communication: Mobility status PT Visit Diagnosis: Other abnormalities of gait and mobility (R26.89);Pain;Difficulty in walking, not elsewhere classified (R26.2);Muscle weakness (generalized) (M62.81) Pain - Right/Left: Right Pain - part of body: Hip     Time: 0459-9774 PT Time Calculation (min) (ACUTE ONLY): 36 min  Charges:  $Gait Training: 23-37 mins                     Rolinda Roan, PT, DPT Acute Rehabilitation Services Pager: [phone] Office: [phone]    Thelma Comp 11/25/2020, 1:38 PM

## 2020-11-25 NOTE — TOC Progression Note (Addendum)
Transition of Care [facility]) - Progression Note    Patient Details  Name: Cassandra Dyer MRN: [id number] Date of Birth: [birth year]  [facility] CM/SW Contact  Bartholomew Crews, RN Phone Number: [phone] 11/25/2020, 4:14 PM  Clinical Narrative:     Spoke with patient at the bedside to discuss transition home. Serum potassium pending. Renal navigator arranged for patient to have outpatient hemodialysis at her home center tomorrow at 11 am, she will need to be there at 10:40. Patient agreeable to this plan and can arrange transportation home today and to dialysis tomorrow if able.   Patient requested that NCM find out about her outpatient MR Brain that was scheduled prior to admission. Spoke with Dr. Julien Nordmann who confirmed that scan tomorrow is not needed. His office will follow up with patient.   Expected Discharge Plan: [place] Barriers to Discharge: Continued Medical Work up  Expected Discharge Plan and Services Expected Discharge Plan: [place]   Discharge Planning Services: CM Consult Post Acute Care Choice: [place] arrangements for the past 2 months: Single Family Home Expected Discharge Date: 11/28/20               DME Arranged: 3-N-1,[place] DME Agency: [employer] Date DME Agency Contacted: 11/25/20 Time DME Agency Contacted: 1502 Representative spoke with at DME Agency: Freda Munro [place]: PT,OT [place] Agency: [place] ([place]) Date [place]: 11/25/20 Time [place]: 28 Representative spoke with at [place]: [place]) Interventions    Readmission Risk Interventions Readmission Risk Prevention Plan 08/08/2020  Transportation Screening Complete  Medication Review Press photographer) Complete  PCP or Specialist appointment within 3-5 days of discharge Complete  HRI or [place] Complete  SW Recovery Care/Counseling Consult Complete   [place] Not Applicable  Some recent data might be hidden

## 2020-11-25 NOTE — Progress Notes (Signed)
[facility] Progress Note   Subjective:   Patient seen and examined at bedside.  No specific complaints.  Denies pain, CP, SOB, n/v/d, weakness, dizziness and fatigue.  Discussed trying to use her AVF at dialysis today and she reluctantly agreed.  States "I dont like needles".  Objective Vitals:   11/24/20 1636 11/24/20 2119 11/25/20 0537 11/25/20 1145  BP: (!) 111/53 (!) 108/54 (!) 124/59 132/74  Pulse: 78 79 77 86  Resp: 18 17 17 18   Temp: 97.9 F (36.6 C) (!) 97.5 F (36.4 C) 98.5 F (36.9 C) 98.3 F (36.8 C)  TempSrc: Oral Oral Oral   SpO2: 99% 100% 100% 100%  Weight:  54.4 kg    Height:       Physical Exam General:thin, elderly female in NAD Heart:RRR, no mrg Lungs:CTAB, nml WOB on 2L via [place] Abdomen:soft, NTND Extremities:no LE edema Dialysis Access: LU AVF +b/t, [facility]   Filed Weights   11/23/20 1540 11/23/20 2123 11/24/20 2119  Weight: 54.5 kg 54.4 kg 54.4 kg    Intake/Output Summary (Last 24 hours) at 11/25/2020 1316 Last data filed at 11/25/2020 1004 Gross per 24 hour  Intake 830 ml  Output 0 ml  Net 830 ml    Additional Objective Labs: Basic Metabolic Panel: Recent Labs  Lab 11/22/20 0249 11/23/20 0502 11/24/20 0400  NA 134* 133* 133*  K 4.3 6.1* 3.9  CL 96* 95* 95*  CO2 26 24 26   GLUCOSE 107* 120* 133*  BUN 5* 20 14  CREATININE 1.82* 3.91* 2.33*  CALCIUM 7.7* 8.8* 8.2*   Liver Function Tests: Recent Labs  Lab 11/18/20 1338 11/20/20 1240 11/22/20 0249  AST 25 34 29  ALT 18 16 12   ALKPHOS 163* 175* 130*  BILITOT 0.5 0.9 0.7  PROT 6.9 6.5 5.0*  ALBUMIN 2.1* 2.2* 1.6*   CBC: Recent Labs  Lab 11/18/20 1338 11/20/20 1240 11/21/20 0117 11/22/20 0249 11/23/20 0502 11/24/20 0400 11/25/20 0344  WBC 33.8*   < > 30.5* 22.9* 25.6* 24.4* 27.5*  NEUTROABS 30.5*  --   --   --   --   --   --   HGB 10.3*   < > 10.0* 8.3* 8.7* 8.4* 8.5*  HCT 34.4*   < > 32.1* 27.2* 28.0* 25.7* 26.1*  MCV 88.7   < > 87.2 89.5 88.6 88.3 85.9   PLT 412*   < > 367 302 324 363 353   < > = values in this interval not displayed.   CBG: Recent Labs  Lab 11/20/20 1746 11/22/20 1519  GLUCAP 141* 101*    Lab Results  Component Value Date   INR 1.2 11/04/2020   INR 1.1 08/07/2020   INR 1.3 (H) 07/27/2020   Studies/Results: MR BRAIN WO CONTRAST  Result Date: 11/24/2020 CLINICAL DATA:  79 year old female with cerebral metastatic disease. Postoperative day 2 ORIF for pathologic right femoral fracture. EXAM: MRI HEAD WITHOUT CONTRAST TECHNIQUE: Multiplanar, multiecho pulse sequences of the brain and surrounding structures were obtained without intravenous contrast. COMPARISON:  Brain MRI without contrast 01/23/2020. FINDINGS: Brain: Confluent vasogenic edema in the posterior left frontal and superior left parietal lobes surrounding a lobulated 14 mm presumed isointense cerebral metastasis is stable from 11/21/2020. No significant regional mass effect. Other predominantly T2 dark, FLAIR heterogeneous brain masses appear stable, some with associated hemosiderin as before (medial left occipital lobe). And additional lesions are possible given the absence of IV contrast on both of these exams. However, one lesion of the  anterior left frontal lobe has substantially regressed in the short-term based on T2 and FLAIR imaging (series 6, image 21) with resolved adjacent edema. No new areas of vasogenic edema. No significant intracranial mass effect. No superimposed restricted diffusion to suggest acute infarction. No ventriculomegaly, extra-axial collection or acute intracranial hemorrhage. Cervicomedullary junction and pituitary are within normal limits. Vascular: Major intracranial vascular flow voids are stable. Dominant right vertebral artery again suspected. Skull and upper cervical spine: Visualized bone marrow signal is within normal limits. Grossly normal visible cervical spine. Sinuses/Orbits: Stable, negative. Other: Mastoids remain clear.  Grossly normal visible internal auditory structures. IMPRESSION: 1. Stable metastatic disease related cerebral edema since the noncontrast scan on 11/21/2020. No new or progressive lesions are evident in the absence of contrast. Postcontrast imaging likely would identify additional small metastases. 2. Regression of a single small anterior left frontal lobe lesion since 11/21/2020 with largely resolved edema there. 3. No new intracranial abnormality. Electronically Signed   By: Genevie Ann M.D.   On: 11/24/2020 15:53    Medications: . sodium chloride Stopped (11/23/20 0403)  . [START ON 11/27/2020] iron sucrose    . methocarbamol (ROBAXIN) IV     . (feeding supplement) PROSource Plus  30 mL Oral BID BM  . amLODipine  10 mg Oral Daily  . bupivacaine liposome  20 mL Infiltration Once  . carvedilol  12.5 mg Oral BID WC  . Chlorhexidine Gluconate Cloth  6 each Topical Daily  . docusate sodium  100 mg Oral BID  . enoxaparin (LOVENOX) injection  30 mg Subcutaneous Q24H  . feeding supplement (NEPRO CARB STEADY)  237 mL Oral BID BM  . irbesartan  150 mg Oral Daily  . multivitamin  1 tablet Oral QHS  . rosuvastatin  10 mg Oral Daily  . tranexamic acid (CYKLOKAPRON) topical - INTRAOP  2,000 mg Topical Once    Dialysis Orders: [place] on MWF,4 hoursEDW 48.5 kg, [place] 2.0 potassium 2.0 calcium, 2000 units heparin. Venofer 50 mg, no ESA secondary to cancer, Access =left BC aVF placed 08/07/2020, right IJ PermCath  Assessment/Plan: 1. Hyperkalemia2/65missed dialysis &dietary indiscretions.Improved with HD.  Last K 3.9. 2. ESRD-on HD MWF. HD today per regular schedule.Plan to use AVF with HD today.  AVF ready for use in 11/07/2020. 3. Hypertension/volume- BP  in goal. Onamlodipine 10 mg daily, Avapro 150 mg daily, Coreg 12.5 mg twice daily. Amlodipine decreased to 5mg  qd on 11/24/20.Does not appear grossly volume overloaded. Close to EDW, UF as tolerated.  4. Anemia -Hgbstable at  8.5.No ESA d/t malignancy. Continue weekly iron. Transfuse if Hgb<7. 5. Metabolic bone disease -CCaand phos in goal. Not on binders or VDRA. Follow labs.  6. Right femoral neck fracture- MRI hip showed impacted subcapital R femoral neck frx.Status post 12/10 right hip hemiarthroplasty, plan Per ortho.  7. Recent diagnosed lung cancer/Metastatic cancer=followed by Dr. Earlie Server oncology per patient has appointment December 14 to discuss. To start radiation soon. MRI brain12/09`showed 6 intracranial lesions likely reflecting metastases. Edema associated with larger lesions but no significant mass effect. Per admit. 8. Leukocytosis possible reactive in nature, Afebrile, work-up per admit 9. Dispo - SNF vs CIR placement.   Jen Mow, PA-C [facility] 11/25/2020,1:16 PM  LOS: 5 days

## 2020-11-25 NOTE — Consult Note (Signed)
  Radiation Oncology         [phone] ________________________________  Name: Cassandra Dyer MRN: [id number]  Date of Service: 11/25/20  DOB: [birth year]   This is a recently established patient with a newly diagnosed NSCLC of the lung. She was getting MRI and PET scans prior to outlining a concrete treatment plan. She was admitted on 11/20/20.  We received a message from the hospitalist and oncology team today that the patient was inpt at [facility] and had a right femoral fracture s/p arthroplasty on 11/22/20. She developed shaking of her right arm that was reported to the hospitalist service and a 1.5T MRI brain on 11/21/20 without contast showed concern for 6 metastatic lesions including a 2.6 rigth lateral ventricle lesion, a 1.4 cm left postcentral gyrus lesion, and a 1 cm left cerebellar lesion. She was started on dexamethasone 8 mg QID. She proceeded with surgery for her right femur, and has been recovering well. Her RUE stopped shaking, and a repeat MRI yesterday on the 3T scanner without contrast showed similar findings and some improvement in her edema.  I discussed her case with Dr. Louanne Belton and let him know that we would really need a contrasted scan to better determine whether she would be a candidate for stereotactic radiosurgery to treat these metastatic lesions versus whole brain radiation.  I also touched base with Dr. Annette Stable in neurosurgery about her case, he does not feel that she is a surgical candidate primarily, and would recommend SRS if appropriate versus whole brain radiation.  Apparently after I discussed these recommendations with the patient, she was not able to dialyze today, she is going to receive dialysis tomorrow, as well as her normal schedule dialysis on Wednesday.  Her 3T MRI scan is currently scheduled at noon tomorrow with type II agent Gadavist. I will also touch base with nephrology regarding her case.  While I suspect we will also be discussing options of palliative  radiotherapy to her hip, we would wait at least another week and a half before considering this.  We will follow-up with the results of her MRI scan and work with Dr. Annette Stable towards [facility] approach if appropriate versus whole brain radiation.     Carola Rhine, PAC

## 2020-11-25 NOTE — TOC Progression Note (Signed)
Transition of Care [facility]) - Progression Note    Patient Details  Name: Cassandra Dyer MRN: [id number] Date of Birth: 09/04/1941  Transition of Care [facility]) CM/SW Contact  Bartholomew Crews, RN Phone Number:  [phone] 11/25/2020, 3:30 PM  Clinical Narrative:     Notified by Ramond Marrow at [place] that [facility] PT and OT referral accepted. Patient will need home orders for PT, OT with [facility].   Expected Discharge Plan: [place] Barriers to Discharge: Continued Medical Work up  Expected Discharge Plan and Services Expected Discharge Plan: [place]   Discharge Planning Services: CM Consult Post Acute Care Choice: [place] arrangements for the past 2 months: Single Family Home Expected Discharge Date: 11/28/20               DME Arranged: 3-N-1,[place] DME Agency: [employer] Date DME Agency Contacted: 11/25/20 Time DME Agency Contacted: 1502 Representative spoke with at DME Agency: Freda Munro [place]: PT,OT [place] Agency: [place] ([place]) Date [place]: 11/25/20 Time [place]: 52 Representative spoke with at [place]: [place] ([place]) Interventions    Readmission Risk Interventions Readmission Risk Prevention Plan 08/08/2020  Transportation Screening Complete  Medication Review Press photographer) Complete  PCP or Specialist appointment within 3-5 days of discharge Complete  HRI or [place] Complete  SW Recovery Care/Counseling Consult Complete  [place] Not Applicable  Some recent data might be hidden

## 2020-11-25 NOTE — Progress Notes (Signed)
Team received message from [facility] that patient does not meet criteria for admission as she is functioning at a level for discharge to home with [facility] at this time. Patient is scheduled for HD today, but has not had it in the hospital yet today due to patient volume. She is now ready for discharge. Navigator asked if patient is safe for discharge with HD rescheduled for tomorrow if patient agreeable to allow her to get home sooner this evening and free up space for non-discharging HD patients. Team agreed on this and patient's outpatient HD clinic[facility] can accommodate patient tomorrow at 11:00am with a 10:40am arrival. CM spoke with patient about transportation and per her report, patient states her significant other can transport as he typically does.  Navigator confirmed with clinic. Renal PA to send orders.   Alphonzo Cruise,  Renal Navigator [phone]

## 2020-11-25 NOTE — Progress Notes (Signed)
PROGRESS NOTE  Cassandra Dyer XLK:[id number] DOB: 14-Jan-1941 DOA: 11/20/2020 PCP: Biagio Borg, MD   LOS: 5 days   Brief narrative: As per HPI,  Cassandra Dyer is a 79 y.o. female with medical history significant of  hypertension, hyperlipidemia, recent diagnosis of lung cancer; remote h/o renal cell carcinoma; COPD on 2L home O2; and ESRD on HD presented to hospital with generalized  weakness, missed HD.   She also complained of right hip pain and ambulatory dysfunction.  She was unable to go to hemodialysis because of pain.  She also complained of generalized weakness, poor appetite and weight loss.  Of note, patient was previously hospitalized from 11/8-24 after missed HD x 1.  She presented with acute on chronic hypoxic respiratory failure and underwent thoracentesis x 3.  2D Echo was performed with grade 2 diastolic dysfunction.  She was found to have a L hilar mass and bronchoscopy with endobronchial [place] was performed.  She was subsequently diagnosed with stage IV (T3, N2, M1a) NSCLC.  MRI brain and PET scan were ordered.   Patient was seen by Rad Onc on 12/2 with the plan for palliative radiation.  She was seen again by the oncology PA on 12/6 with c/o SOB and R hip pain.  CXR showed stable pleural effusions and R hip film with minimally displaced R femoral neck fracture concerning for pathologic fracture.  She was told to stay off the hip and has been in a wheelchair since.    In the ED, patient was noted to be mildly hyperkalemic.  Nephrology was consulted for hemodialysis.  She had missed hemodialysis on Monday and Wednesday.  Patient was then admitted hospital for further evaluation and treatment.  Orthopedics was also consulted for surgical intervention.   Assessment/Plan:  Principal Problem:   Closed displaced fracture of right femoral neck (HCC) Active Problems:   Essential hypertension   Dyslipidemia   Hyperkalemia, diminished renal excretion   COPD (chronic obstructive  pulmonary disease) (HCC)   Adenocarcinoma of left lung, stage 4 (HCC)   Noncompliance of patient with renal dialysis ([facility])  Acute right hip pain with pathological femoral neck fracture.  MRI of the hip showed impacted subcapital right femoral neck pathological fracture.  Orthopedics was consulted and patient underwent radical resection of the right femoral head and neck lesion with right hip hemiarthroplasty on 11/22/2020.  ESRD on hemodialysis, missed hemodialysis  Continue hemodialysis as per nephrology.  Discontinue IV fluids.  Hyperkalemia Missed hemodialysis.  Resolved.  Continue hemodialysis.  Right upper extremity twitching.  Has improved.  On Decadron.  Stage 4 lung cancer, recent diagnosis. Patient is supposed to start radiation soon.  MRI of the brain without contrast with 6 metastatic lesions.  Chest x-ray done showed cardiomegaly with central vascular congestion and new confluent patchy bilateral opacities.  Respiratory panel was negative for Covid and influenza.  Patient is to follow-up with oncology as outpatient.  Has been started on dexamethasone due to right upper extremity twitching.  MRI showed cerebral edema.  Essential hypertension Continue Coreg, Avapro amlodipine, discontinued Toprol-XL.  Latest blood pressure of 124/59  Hyperlipidemia -Continue statins  COPD with chronic hypoxic respiratory failure. On 2 L of oxygen by nasal cannula.  Continue bronchodilators.  Compensated at this time.  Significant leukocytosis.  Likely chronic.  Unlikely to be infective in nature.  Anemia of chronic kidney disease.  Continue to monitor closely.  Nephrology on board.  Latest hemoglobin of 8.4.  DVT prophylaxis: enoxaparin (LOVENOX) injection 30  mg Start: 11/23/20 0800 SCDs Start: 11/22/20 1648 SCDs Start: 11/20/20 1624, heparin subcu  Code Status: DNR  Family Communication:  None today.  Status is: Inpatient  Remains inpatient appropriate because: Unsafe d/c  plan, IV treatments appropriate due to intensity of illness or inability to take PO and Inpatient level of care appropriate due to severity of illness, status post surgical intervention, CIR placement  Dispo: The patient is from: Home              Anticipated d/c is to: CIR as per PT evaluation              Anticipated d/c date is: 1 to 2 days              Patient currently is  medically stable to d/c.  Consultants: Nephrology;  Orthopedics;  Procedures:  Hemodialysis  radical resection of the right femoral head and neck lesion with right hip hemiarthroplasty on 11/22/2020.  Antibiotics:  . None  Subjective:  Patient was seen and examined at bedside.  Patient denies any pain, nausea, vomiting, shortness of breath, fever or chills but has generalized weakness.  Patient stated that she walked inside the room.  Objective: Vitals:   11/24/20 2119 11/25/20 0537  BP: (!) 108/54 (!) 124/59  Pulse: 79 77  Resp: 17 17  Temp: (!) 97.5 F (36.4 C) 98.5 F (36.9 C)  SpO2: 100% 100%    Intake/Output Summary (Last 24 hours) at 11/25/2020 1125 Last data filed at 11/25/2020 1004 Gross per 24 hour  Intake 1080 ml  Output 0 ml  Net 1080 ml   Filed Weights   11/23/20 1540 11/23/20 2123 11/24/20 2119  Weight: 54.5 kg 54.4 kg 54.4 kg   Body mass index is 21.94 kg/m.   Physical Exam:  General: Thinly built, not in obvious distress, alert awake and communicative, on nasal cannula 2 L/min. HENT:   No scleral pallor or icterus noted. Oral mucosa is moist.  Right internal jugular hemodialysis catheter in place. Chest:  .  Diminished breath sounds bilaterally.  CVS: S1 &S2 heard. No murmur.  Regular rate and rhythm. Abdomen: Soft, nontender, nondistended.  Bowel sounds are heard.   Extremities: No cyanosis, clubbing or edema.  Status post right hip surgery with dressing.  Moving extremities. Psych: Alert, awake and communicative.  Normal mood CNS:  No cranial nerve deficits.  Moving  extremities. Skin: Warm and dry.  No rashes noted.  Data Review: I have personally reviewed the following laboratory data and studies,  CBC: Recent Labs  Lab 11/18/20 1338 11/20/20 1240 11/21/20 0117 11/22/20 0249 11/23/20 0502 11/24/20 0400 11/25/20 0344  WBC 33.8*   < > 30.5* 22.9* 25.6* 24.4* 27.5*  NEUTROABS 30.5*  --   --   --   --   --   --   HGB 10.3*   < > 10.0* 8.3* 8.7* 8.4* 8.5*  HCT 34.4*   < > 32.1* 27.2* 28.0* 25.7* 26.1*  MCV 88.7   < > 87.2 89.5 88.6 88.3 85.9  PLT 412*   < > 367 302 324 363 353   < > = values in this interval not displayed.   Basic Metabolic Panel: Recent Labs  Lab 11/20/20 1240 11/21/20 0117 11/22/20 0249 11/23/20 0502 11/24/20 0400  NA 134* 136 134* 133* 133*  K 6.8* 3.6 4.3 6.1* 3.9  CL 91* 95* 96* 95* 95*  CO2 25 24 26 24 26   GLUCOSE 83 94 107* 120* 133*  BUN 63* 15 5* 20 14  CREATININE 10.96* 3.93* 1.82* 3.91* 2.33*  CALCIUM 9.8 7.9* 7.7* 8.8* 8.2*  MG  --   --  1.8 2.0  --    Liver Function Tests: Recent Labs  Lab 11/18/20 1338 11/20/20 1240 11/22/20 0249  AST 25 34 29  ALT 18 16 12   ALKPHOS 163* 175* 130*  BILITOT 0.5 0.9 0.7  PROT 6.9 6.5 5.0*  ALBUMIN 2.1* 2.2* 1.6*   No results for input(s): LIPASE, AMYLASE in the last 168 hours. No results for input(s): AMMONIA in the last 168 hours. Cardiac Enzymes: No results for input(s): CKTOTAL, CKMB, CKMBINDEX, TROPONINI in the last 168 hours. BNP (last 3 results) No results for input(s): BNP in the last 8760 hours.  ProBNP (last 3 results) No results for input(s): PROBNP in the last 8760 hours.  CBG: Recent Labs  Lab 11/20/20 1746 11/22/20 1519  GLUCAP 141* 101*   Recent Results (from the past 240 hour(s))  Resp Panel by RT-PCR (Flu A&B, Covid) Nasopharyngeal Swab     Status: None   Collection Time: 11/20/20  3:59 PM   Specimen: Nasopharyngeal Swab; Nasopharyngeal(NP) swabs in vial transport medium  Result Value Ref Range Status   SARS Coronavirus 2 by RT  PCR NEGATIVE NEGATIVE Final    Comment: (NOTE) SARS-CoV-2 target nucleic acids are NOT DETECTED.  The SARS-CoV-2 RNA is generally detectable in upper respiratory specimens during the acute phase of infection. The lowest concentration of SARS-CoV-2 viral copies this assay can detect is 138 copies/mL. A negative result does not preclude SARS-Cov-2 infection and should not be used as the sole basis for treatment or other patient management decisions. A negative result may occur with  improper specimen collection/handling, submission of specimen other than nasopharyngeal swab, presence of viral mutation(s) within the areas targeted by this assay, and inadequate number of viral copies(<138 copies/mL). A negative result must be combined with clinical observations, patient history, and epidemiological information. The expected result is Negative.  Fact Sheet for Patients:  EntrepreneurPulse.com.au  Fact Sheet for Healthcare Providers:  IncredibleEmployment.be  This test is no t yet approved or cleared by the [place] FDA and  has been authorized for detection and/or diagnosis of SARS-CoV-2 by FDA under an Emergency Use Authorization (EUA). This EUA will remain  in effect (meaning this test can be used) for the duration of the COVID-19 declaration under Section 564(b)(1) of the Act, 21 U.S.C.section 360bbb-3(b)(1), unless the authorization is terminated  or revoked sooner.       Influenza A by PCR NEGATIVE NEGATIVE Final   Influenza B by PCR NEGATIVE NEGATIVE Final    Comment: (NOTE) The Xpert Xpress SARS-CoV-2/FLU/RSV plus assay is intended as an aid in the diagnosis of influenza from Nasopharyngeal swab specimens and should not be used as a sole basis for treatment. Nasal washings and aspirates are unacceptable for Xpert Xpress SARS-CoV-2/FLU/RSV testing.  Fact Sheet for Patients: EntrepreneurPulse.com.au  Fact Sheet for  Healthcare Providers: IncredibleEmployment.be  This test is not yet approved or cleared by the [place] FDA and has been authorized for detection and/or diagnosis of SARS-CoV-2 by FDA under an Emergency Use Authorization (EUA). This EUA will remain in effect (meaning this test can be used) for the duration of the COVID-19 declaration under Section 564(b)(1) of the Act, 21 U.S.C. section 360bbb-3(b)(1), unless the authorization is terminated or revoked.  Performed at [facility], [place] [address] [place], [place] [address]   Surgical pcr screen  Status: None   Collection Time: 11/21/20  8:22 PM   Specimen: Nasal Mucosa; Nasal Swab  Result Value Ref Range Status   MRSA, PCR NEGATIVE NEGATIVE Final   Staphylococcus aureus NEGATIVE NEGATIVE Final    Comment: (NOTE) The Xpert SA Assay (FDA approved for NASAL specimens in patients 53 years of age and older), is one component of a comprehensive surveillance program. It is not intended to diagnose infection nor to guide or monitor treatment. Performed at [facility], [place] [address] [place], [place] [address]      Studies: MR BRAIN WO CONTRAST  Result Date: 11/24/2020 CLINICAL DATA:  79 year old female with cerebral metastatic disease. Postoperative day 2 ORIF for pathologic right femoral fracture. EXAM: MRI HEAD WITHOUT CONTRAST TECHNIQUE: Multiplanar, multiecho pulse sequences of the brain and surrounding structures were obtained without intravenous contrast. COMPARISON:  Brain MRI without contrast 01/23/2020. FINDINGS: Brain: Confluent vasogenic edema in the posterior left frontal and superior left parietal lobes surrounding a lobulated 14 mm presumed isointense cerebral metastasis is stable from 11/21/2020. No significant regional mass effect. Other predominantly T2 dark, FLAIR heterogeneous brain masses appear stable, some with associated hemosiderin as before (medial left occipital lobe). And  additional lesions are possible given the absence of IV contrast on both of these exams. However, one lesion of the anterior left frontal lobe has substantially regressed in the short-term based on T2 and FLAIR imaging (series 6, image 21) with resolved adjacent edema. No new areas of vasogenic edema. No significant intracranial mass effect. No superimposed restricted diffusion to suggest acute infarction. No ventriculomegaly, extra-axial collection or acute intracranial hemorrhage. Cervicomedullary junction and pituitary are within normal limits. Vascular: Major intracranial vascular flow voids are stable. Dominant right vertebral artery again suspected. Skull and upper cervical spine: Visualized bone marrow signal is within normal limits. Grossly normal visible cervical spine. Sinuses/Orbits: Stable, negative. Other: Mastoids remain clear. Grossly normal visible internal auditory structures. IMPRESSION: 1. Stable metastatic disease related cerebral edema since the noncontrast scan on 11/21/2020. No new or progressive lesions are evident in the absence of contrast. Postcontrast imaging likely would identify additional small metastases. 2. Regression of a single small anterior left frontal lobe lesion since 11/21/2020 with largely resolved edema there. 3. No new intracranial abnormality. Electronically Signed   By: Genevie Ann M.D.   On: 11/24/2020 15:53     Flora Lipps, MD  [facility] 11/25/2020

## 2020-11-25 NOTE — Plan of Care (Signed)
  Problem: Clinical Measurements: Goal: Ability to maintain clinical measurements within normal limits will improve Outcome: Progressing   Problem: Coping: Goal: Level of anxiety will decrease Outcome: Progressing   

## 2020-11-25 NOTE — TOC Initial Note (Signed)
Transition of Care [facility]) - Initial/Assessment Note    Patient Details  Name: Cassandra Dyer MRN: [id number] Date of Birth: [birth year]  Transition of Care [facility]) CM/SW Contact:    Bartholomew Crews, RN Phone Number: [phone] 11/25/2020, 3:13 PM  Clinical Narrative:                  Spoke with patient at the bedside. PTA home with friend, Eddie Dibbles. She stated that Eddie Dibbles provides transportation for dialysis. Discussed her progress with therapy and readiness to transition home with home health. Patient in agreement. Discussed home health agency choice - referral pending. Discussed recommendations for DME - RW, 3N1. Referral sent to [facility] for delivery to the room. Patient stated that Eddie Dibbles will provide transportation home from the hospital. [facility] following for transition needs.   Expected Discharge Plan: [place] Barriers to Discharge: Continued Medical Work up   Patient Goals and CMS Choice   CMS Medicare.gov Compare Post Acute Care list provided to:: Patient Choice offered to / list presented to : Patient  Expected Discharge Plan and Services Expected Discharge Plan: [place]   Discharge Planning Services: CM Consult Post Acute Care Choice: [place] arrangements for the past 2 months: Single Family Home Expected Discharge Date: 11/28/20               DME Arranged: 3-N-1,[place] DME Agency: [facility] Date DME Agency Contacted: 11/25/20 Time DME Agency Contacted: 1502 Representative spoke with at DME Agency: [place]: PT,OT [place] Agency: [place] ([place]) Date [place]: 11/25/20 Time [address]: 43 Representative spoke with at [place]: [place] Arrangements/Services Living arrangements for the past 2 months: [place] with:: Self,Friends Eddie Dibbles) Patient language and need for interpreter reviewed:: Yes Do you feel safe going back to the place where you live?:  Yes      Need for Family Participation in Patient Care: Yes (Comment) Care giver support system in place?: Yes (comment)   Criminal Activity/Legal Involvement Pertinent to Current Situation/Hospitalization: No - Comment as needed  Activities of Daily Living Home Assistive Devices/Equipment: None ADL Screening (condition at time of admission) Patient's cognitive ability adequate to safely complete daily activities?: Yes Is the patient deaf or have difficulty hearing?: No Does the patient have difficulty seeing, even when wearing glasses/contacts?: No Does the patient have difficulty concentrating, remembering, or making decisions?: No Patient able to express need for assistance with ADLs?: Yes Does the patient have difficulty dressing or bathing?: No Independently performs ADLs?: No Communication: Independent Dressing (OT): Needs assistance Is this a change from baseline?: Change from baseline, expected to last >3 days Grooming: Needs assistance Is this a change from baseline?: Change from baseline, expected to last >3 days Feeding: Independent Bathing: Needs assistance Is this a change from baseline?: Change from baseline, expected to last >3 days Toileting: Needs assistance Is this a change from baseline?: Change from baseline, expected to last >3days In/Out Bed: Needs assistance Is this a change from baseline?: Change from baseline, expected to last >3 days Walks in Home: Dependent Is this a change from baseline?: Change from baseline, expected to last >3 days Does the patient have difficulty walking or climbing stairs?: Yes Weakness of Legs: Both Weakness of Arms/Hands: Right  Permission Sought/Granted                  Emotional Assessment Appearance:: Appears stated age Attitude/Demeanor/Rapport: Engaged Affect (typically observed):  Accepting Orientation: : Oriented to Self,Oriented to  Time,Oriented to Place,Oriented to Situation Alcohol / Substance Use: Not  Applicable Psych Involvement: No (comment)  Admission diagnosis:  Anorexia [R63.0] Hyperkalemia [E87.5] Noncompliance of patient with renal dialysis ([place]) [Z91.15] Malignant neoplasm of lung, unspecified laterality, unspecified part of lung ([place]) [C34.90] Patient Active Problem List   Diagnosis Date Noted  . Closed displaced fracture of right femoral neck ([place]) 11/22/2020  . Noncompliance of patient with renal dialysis ([place]) 11/20/2020  . Adenocarcinoma of left lung, stage 4 ([place]) 11/16/2020  . Encounter for antineoplastic chemotherapy 11/16/2020  . Goals of care, counseling/discussion 11/16/2020  . Mass of upper lobe of left lung 11/05/2020  . Mediastinal adenopathy 11/05/2020  . Pulmonary nodules 11/05/2020  . Acute respiratory failure with hypoxia ([place]) 10/23/2020  . Pulmonary edema 10/22/2020  . Acute pulmonary edema ([place]) 10/22/2020  . Educated about COVID-19 virus infection 10/10/2020  . Chronic hypoxemic respiratory failure ([employer]) 09/26/2020  . COPD (chronic obstructive pulmonary disease) ([place]) 09/26/2020  . Nausea vomiting and diarrhea 09/10/2020  . ESRD (end stage renal disease) ([place])   . Volume overload   . Cough 08/04/2020  . Thrombocytopenia ([place]) 08/02/2020  . Anemia 08/02/2020  . Hyperkalemia, diminished renal excretion 07/27/2020  . Hyperlipidemia 07/27/2020  . Metabolic acidosis 05/69/7948  . CKD (chronic kidney disease) 05/23/2020  . Solitary kidney, acquired 05/23/2020  . Fever 04/26/2020  . Acute hypoxemic respiratory failure ([place]) 04/19/2020  . Diarrhea 04/19/2020  . Nausea & vomiting 04/19/2020  . Pneumonia 04/09/2020  . Acute kidney injury (AKI) with acute tubular necrosis (ATN) ([place]) 04/09/2020  . Hypertensive urgency 04/09/2020  . History of hepatitis C 04/09/2020  . Sepsis ([place]) 04/08/2020  . Venous insufficiency 12/28/2019  . Hyperglycemia 06/29/2019  . Osteoporosis 02/20/2018  . Essential hypertension 02/15/2017  . Dyslipidemia 02/15/2017  .  Renal cell carcinoma of right kidney ([place]) 02/15/2017   PCP:  Biagio Borg, MD Pharmacy:   [place] ([place]), [place] - [address] [place] ([place]) [place] [address] Phone: [phone] Fax: [phone]     Social Determinants of Health (SDOH) Interventions    Readmission Risk Interventions Readmission Risk Prevention Plan 08/08/2020  Transportation Screening Complete  Medication Review ([place]) Complete  PCP or Specialist appointment within 3-5 days of discharge Complete  HRI or [place] Complete  SW Recovery Care/Counseling Consult Complete  [place] Not Applicable  Some recent data might be hidden

## 2020-11-25 NOTE — Progress Notes (Addendum)
Inpatient Rehabilitation Admissions Coordinator  Inpatient rehab consult received. I met with patient at bedside for assessment. We discussed goals and expectations of a possible Cir admit. She was working up until recent diagnosis of cancer and has her boyfriend, Eddie Dibbles, and two sisters that can assist once more mobile at home. States she was to see Dr. Inda Merlin this Thursday to discuss plans for radiation. Need finalized plans for radiation before possible admit to Cir, to coordinate intensive rehab of 3 hours per day 5 days per week, dialysis and radiation treatments. Please advise. I will follow up tomorrow. Patient prefers Cir rather than SNF if possible.  Danne Baxter, RN, MSN Rehab Admissions Coordinator [phone] 11/25/2020 11:40 AM   Notified by therapy that patient progressing very well with therapy today and may progress to d/c directly home with [facility]. I met with patient again to discuss the recommendation upgraded to home with [facility]. She is not in need of a Cir admit prior to d/c home. I will alert acute team and TOC. We will sign off at this time.  Danne Baxter, RN, MSN Rehab Admissions Coordinator [phone] 11/25/2020 2:20 PM

## 2020-11-26 ENCOUNTER — Encounter (HOSPITAL_COMMUNITY): Payer: Medicare Other

## 2020-11-26 ENCOUNTER — Inpatient Hospital Stay: Payer: Medicare Other

## 2020-11-26 ENCOUNTER — Inpatient Hospital Stay (HOSPITAL_COMMUNITY): Payer: Medicare Other

## 2020-11-26 ENCOUNTER — Encounter: Payer: Self-pay | Admitting: Internal Medicine

## 2020-11-26 ENCOUNTER — Inpatient Hospital Stay (HOSPITAL_COMMUNITY): Admission: RE | Admit: 2020-11-26 | Payer: Medicare Other | Source: Ambulatory Visit

## 2020-11-26 LAB — BASIC METABOLIC PANEL
Anion gap: 15 (ref 5–15)
BUN: 71 mg/dL — ABNORMAL HIGH (ref 8–23)
CO2: 25 mmol/L (ref 22–32)
Calcium: 8.7 mg/dL — ABNORMAL LOW (ref 8.9–10.3)
Chloride: 90 mmol/L — ABNORMAL LOW (ref 98–111)
Creatinine, Ser: 5.86 mg/dL — ABNORMAL HIGH (ref 0.44–1.00)
GFR, Estimated: 7 mL/min — ABNORMAL LOW (ref >60)
Glucose, Bld: 181 mg/dL — ABNORMAL HIGH (ref 70–99)
Potassium: 4 mmol/L (ref 3.5–5.1)
Sodium: 130 mmol/L — ABNORMAL LOW (ref 135–145)

## 2020-11-26 LAB — CBC
HCT: 23.5 % — ABNORMAL LOW (ref 36.0–46.0)
Hemoglobin: 7.7 g/dL — ABNORMAL LOW (ref 12.0–15.0)
MCH: 28.2 pg (ref 26.0–34.0)
MCHC: 32.8 g/dL (ref 30.0–36.0)
MCV: 86.1 fL (ref 80.0–100.0)
Platelets: 373 10*3/uL (ref 150–400)
RBC: 2.73 MIL/uL — ABNORMAL LOW (ref 3.87–5.11)
RDW: 15.9 % — ABNORMAL HIGH (ref 11.5–15.5)
WBC: 31.8 10*3/uL — ABNORMAL HIGH (ref 4.0–10.5)
nRBC: 0 % (ref 0.0–0.2)

## 2020-11-26 LAB — MAGNESIUM: Magnesium: 1.9 mg/dL (ref 1.7–2.4)

## 2020-11-26 LAB — SURGICAL PATHOLOGY

## 2020-11-26 MED ORDER — GADOBUTROL 1 MMOL/ML IV SOLN
5.0000 mL | Freq: Once | INTRAVENOUS | Status: AC | PRN
  Administered 2020-11-26: 5 mL via INTRAVENOUS

## 2020-11-26 MED ORDER — HEPARIN SODIUM (PORCINE) 1000 UNIT/ML IJ SOLN
INTRAMUSCULAR | Status: AC
  Filled 2020-11-26: qty 4

## 2020-11-26 NOTE — Progress Notes (Signed)
Subjective: In room examined at bedside no specific complaints for dialysis today after MRI with contrast of brain, attempt to use AV fistula for first time, no further shaking of her arms she reports Objective Vital signs in last 24 hours: Vitals:   11/25/20 1652 11/25/20 2034 11/26/20 0449 11/26/20 0938  BP: (!) 118/54 (!) 105/49 (!) 144/64 131/75  Pulse: 77 77 75 73  Resp: 18 18 18 16   Temp: 97.7 F (36.5 C) 98.5 F (36.9 C) 98.4 F (36.9 C) 98.4 F (36.9 C)  TempSrc: Oral Oral Oral Oral  SpO2: 100% 100% 100% 100%  Weight:  54.9 kg    Height:       Weight change: 0.497 kg  Physical Exam General: Alert, thin, ill-appearing elderly female in NAD Heart:RRR, no mrg Lungs:CTAB, nml WOB on 2L via [place] Abdomen:soft, NTND Extremities:no LE edema Dialysis Access: LU AVF +b/t, TDC  OP Dialysis Orders: [place] on MWF,4 hoursEDW 48.5 kg, [place] 2.0 potassium 2.0 calcium, 2000 units heparin. Venofer 50 mg, no ESA secondary to cancer, Access =left BC aVF placed 08/07/2020, right IJ PermCath  Assessment/Plan: 1. Hyperkalemia2/59missed dialysis &dietary indiscretions.Improved with HD.  Last K 4.0 2. ESRD-on HD MWF. HD yesterday secondary to excess patient volume more acute patient's plan for HD today after MRI of brain with  contrast and back tomorrow per regular schedule.Plan to use AVF with HD today.  AVF ready for use in11/25/2021 per outpatient VVS note. 3. Hypertension/volume- BP in goal. Onamlodipine 10 mg daily, Avapro 150 mg daily,Coreg 12.5 mg twice daily. Amlodipine decreased to 5mg  qd on 11/24/20.Does not appear grossly volume overloaded. Close to EDW, UF as tolerated.  May be able to taper off more BP meds follow-up trend 4. Anemia -Hgb 7.7 <8.5.>  7.7 .no ESA d/t malignancy. Continue weekly iron. Transfuse if Hgb<7. 5. Metabolic bone disease -CCaand phos in goal. Not on binders or VDRA. Follow labs.  6. Right femoral neck fracture- MRI hip showed  impacted subcapital R femoral neck frx.Status post 12/10 right hip hemiarthroplasty, plan Per ortho.  7. Recent diagnosed lung cancer/Metastatic cancer=followed by Dr. Earlie Server oncology per patient has appointment December 14 to discuss. To start radiation soon. MRI brain12/09`showed 6 intracranial lesions likely reflecting metastases. Edema associated with larger lesions but no significant mass effect. Per admit. Neuro surgery also consulted to have contrasted imaging of the brain to determine whether SRS or whole brain radiation is most appropriate.  No indications for direct surgical intervention 8. Leukocytosis possible reactive in nature,Afebrile, work-up per admit 9. Dispo - SNF vs CIR placement.  Ernest Haber, PA-C [facility] [phone] 11/26/2020,12:16 PM  LOS: 6 days   Labs: Basic Metabolic Panel: Recent Labs  Lab 11/23/20 0502 11/24/20 0400 11/25/20 1607 11/26/20 0139  NA 133* 133*  --  130*  K 6.1* 3.9 3.8 4.0  CL 95* 95*  --  90*  CO2 24 26  --  25  GLUCOSE 120* 133*  --  181*  BUN 20 14  --  71*  CREATININE 3.91* 2.33*  --  5.86*  CALCIUM 8.8* 8.2*  --  8.7*   Liver Function Tests: Recent Labs  Lab 11/20/20 1240 11/22/20 0249  AST 34 29  ALT 16 12  ALKPHOS 175* 130*  BILITOT 0.9 0.7  PROT 6.5 5.0*  ALBUMIN 2.2* 1.6*   No results for input(s): LIPASE, AMYLASE in the last 168 hours. No results for input(s): AMMONIA in the last 168 hours. CBC: Recent Labs  Lab  11/22/20 0249 11/23/20 0502 11/24/20 0400 11/25/20 0344 11/26/20 0139  WBC 22.9* 25.6* 24.4* 27.5* 31.8*  HGB 8.3* 8.7* 8.4* 8.5* 7.7*  HCT 27.2* 28.0* 25.7* 26.1* 23.5*  MCV 89.5 88.6 88.3 85.9 86.1  PLT 302 324 363 353 373   Cardiac Enzymes: No results for input(s): CKTOTAL, CKMB, CKMBINDEX, TROPONINI in the last 168 hours. CBG: Recent Labs  Lab 11/20/20 1746 11/22/20 1519  GLUCAP 141* 101*    Studies/Results: MR BRAIN WO CONTRAST  Result Date:  11/24/2020 CLINICAL DATA:  79 year old female with cerebral metastatic disease. Postoperative day 2 ORIF for pathologic right femoral fracture. EXAM: MRI HEAD WITHOUT CONTRAST TECHNIQUE: Multiplanar, multiecho pulse sequences of the brain and surrounding structures were obtained without intravenous contrast. COMPARISON:  Brain MRI without contrast 01/23/2020. FINDINGS: Brain: Confluent vasogenic edema in the posterior left frontal and superior left parietal lobes surrounding a lobulated 14 mm presumed isointense cerebral metastasis is stable from 11/21/2020. No significant regional mass effect. Other predominantly T2 dark, FLAIR heterogeneous brain masses appear stable, some with associated hemosiderin as before (medial left occipital lobe). And additional lesions are possible given the absence of IV contrast on both of these exams. However, one lesion of the anterior left frontal lobe has substantially regressed in the short-term based on T2 and FLAIR imaging (series 6, image 21) with resolved adjacent edema. No new areas of vasogenic edema. No significant intracranial mass effect. No superimposed restricted diffusion to suggest acute infarction. No ventriculomegaly, extra-axial collection or acute intracranial hemorrhage. Cervicomedullary junction and pituitary are within normal limits. Vascular: Major intracranial vascular flow voids are stable. Dominant right vertebral artery again suspected. Skull and upper cervical spine: Visualized bone marrow signal is within normal limits. Grossly normal visible cervical spine. Sinuses/Orbits: Stable, negative. Other: Mastoids remain clear. Grossly normal visible internal auditory structures. IMPRESSION: 1. Stable metastatic disease related cerebral edema since the noncontrast scan on 11/21/2020. No new or progressive lesions are evident in the absence of contrast. Postcontrast imaging likely would identify additional small metastases. 2. Regression of a single small  anterior left frontal lobe lesion since 11/21/2020 with largely resolved edema there. 3. No new intracranial abnormality. Electronically Signed   By: Genevie Ann M.D.   On: 11/24/2020 15:53   Medications: . sodium chloride Stopped (11/23/20 0403)  . [START ON 11/27/2020] iron sucrose    . methocarbamol (ROBAXIN) IV     . (feeding supplement) PROSource Plus  30 mL Oral BID BM  . amLODipine  10 mg Oral Daily  . bupivacaine liposome  20 mL Infiltration Once  . carvedilol  12.5 mg Oral BID WC  . Chlorhexidine Gluconate Cloth  6 each Topical Daily  . docusate sodium  100 mg Oral BID  . enoxaparin (LOVENOX) injection  30 mg Subcutaneous Q24H  . feeding supplement (NEPRO CARB STEADY)  237 mL Oral BID BM  . irbesartan  150 mg Oral Daily  . multivitamin  1 tablet Oral QHS  . rosuvastatin  10 mg Oral Daily  . tranexamic acid (CYKLOKAPRON) topical - INTRAOP  2,000 mg Topical Once

## 2020-11-26 NOTE — Consult Note (Signed)
Reason for Consult: Metastatic cancer Referring Physician: Radiation oncology  Cassandra Dyer is an 79 y.o. female.  HPI: 79 year old female with end-stage renal disease and poor overall health.  Patient presents with a pathologic hip fracture.  Work-up is also demonstrated multiple areas of metastasis to the brain the most significant of which is within the body of her left lateral ventricle.  Patient is being evaluated for possible SRS treatment.  Past Medical History:  Diagnosis Date  . Anemia   . ESRD on hemodialysis ([place])   . Hepatitis    hx of hep C - 10-20 years ago   . History of blood transfusion   . History of kidney cancer   . Hyperlipidemia   . Hypertension    not taken b/p med in 2-3 years md deceased never went to another md   . Renal cell carcinoma ([place]) 2018  . Stage 4 lung cancer ([place])    new - due to start therapy with next appt on 12/14    Past Surgical History:  Procedure Laterality Date  . ANTERIOR APPROACH HEMI HIP ARTHROPLASTY Right 11/22/2020   Procedure: ANTERIOR APPROACH RIGHT HIP HEMIARTHROPLASTY;  Surgeon: Leandrew Koyanagi, MD;  Location: [facility];  Service: Orthopedics;  Laterality: Right;  Needs RNFA  . AV FISTULA PLACEMENT Left 08/07/2020   Procedure: LEFT ARM ARTERIOVENOUS (AV) FISTULA CREATION;  Surgeon: [name], MD;  Location: [facility];  Service: Vascular;  Laterality: Left;  . BRONCHIAL BIOPSY  11/05/2020   Procedure: BRONCHIAL BIOPSIES;  Surgeon: Collene Gobble, MD;  Location: [facility] ENDOSCOPY;  Service: Cardiopulmonary;;  . BRONCHIAL BRUSHINGS  11/05/2020   Procedure: BRONCHIAL BRUSHINGS;  Surgeon: Collene Gobble, MD;  Location: [facility];  Service: Cardiopulmonary;;  . BRONCHIAL NEEDLE ASPIRATION BIOPSY  11/05/2020   Procedure: BRONCHIAL NEEDLE ASPIRATION BIOPSIES;  Surgeon: Collene Gobble, MD;  Location: [facility];  Service: Cardiopulmonary;;  . CATARACT EXTRACTION Bilateral    11/30 left /12/9 right  . ELECTROMAGNETIC NAVIGATION  BROCHOSCOPY N/A 11/05/2020   Procedure: ELECTROMAGNETIC NAVIGATION BRONCHOSCOPY;  Surgeon: Collene Gobble, MD;  Location: [facility];  Service: Cardiopulmonary;  Laterality: N/A;  . ENDOBRONCHIAL ULTRASOUND N/A 11/05/2020   Procedure: ENDOBRONCHIAL ULTRASOUND;  Surgeon: Collene Gobble, MD;  Location: [facility] ENDOSCOPY;  Service: Cardiopulmonary;  Laterality: N/A;  . FINE NEEDLE ASPIRATION  11/05/2020   Procedure: FINE NEEDLE ASPIRATION (FNA) LINEAR;  Surgeon: Collene Gobble, MD;  Location: [facility] ENDOSCOPY;  Service: Cardiopulmonary;;  . IR FLUORO GUIDE CV LINE RIGHT  07/27/2020  . IR THORACENTESIS ASP PLEURAL SPACE W/IMG GUIDE  10/24/2020  . IR THORACENTESIS ASP PLEURAL SPACE W/IMG GUIDE  10/25/2020  . IR THORACENTESIS ASP PLEURAL SPACE W/IMG GUIDE  10/31/2020  . IR US GUIDE VASC ACCESS RIGHT  07/27/2020  . ROBOT ASSISTED LAPAROSCOPIC NEPHRECTOMY Right 01/13/2017   Procedure: XI ROBOTIC ASSISTED LAPAROSCOPIC RADICAL NEPHRECTOMY WITH LYSIS OF ADHESION;  Surgeon: Alexis Frock, MD;  Location: [facility];  Service: Urology;  Laterality: Right;  Marland Kitchen VIDEO BRONCHOSCOPY N/A 11/05/2020   Procedure: VIDEO BRONCHOSCOPY WITH FLUORO;  Surgeon: Collene Gobble, MD;  Location: [facility] ENDOSCOPY;  Service: Cardiopulmonary;  Laterality: N/A;    Family History  Problem Relation Age of Onset  . Hypertension Mother   . Pancreatic cancer Father   . Breast cancer Sister     Social History:  reports that she quit smoking about 19 years ago. Her smoking use included cigarettes. She has a 15.00 pack-year smoking history. She has never  used smokeless tobacco. She reports current alcohol use. She reports that she does not use drugs.  Allergies: No Known Allergies  Medications: I have reviewed the patient's current medications.  Results for orders placed or performed during the hospital encounter of 11/20/20 (from the past 48 hour(s))  CBC     Status: Abnormal   Collection Time: 11/25/20  3:44 AM  Result Value Ref Range    WBC 27.5 (H) 4.0 - 10.5 K/uL   RBC 3.04 (L) 3.87 - 5.11 MIL/uL   Hemoglobin 8.5 (L) 12.0 - 15.0 g/dL   HCT 26.1 (L) 36.0 - 46.0 %   MCV 85.9 80.0 - 100.0 fL   MCH 28.0 26.0 - 34.0 pg   MCHC 32.6 30.0 - 36.0 g/dL   RDW 15.9 (H) 11.5 - 15.5 %   Platelets 353 150 - 400 K/uL   nRBC 0.0 0.0 - 0.2 %    Comment: Performed at [facility], [address] [place], [place] [address]  Potassium     Status: None   Collection Time: 11/25/20  4:07 PM  Result Value Ref Range   Potassium 3.8 3.5 - 5.1 mmol/L    Comment: Performed at [facility] [address] [place], [place] [address]  Basic metabolic panel     Status: Abnormal   Collection Time: 11/26/20  1:39 AM  Result Value Ref Range   Sodium 130 (L) 135 - 145 mmol/L   Potassium 4.0 3.5 - 5.1 mmol/L   Chloride 90 (L) 98 - 111 mmol/L   CO2 25 22 - 32 mmol/L   Glucose, Bld 181 (H) 70 - 99 mg/dL    Comment: Glucose reference range applies only to samples taken after fasting for at least 8 hours.   BUN 71 (H) 8 - 23 mg/dL   Creatinine, Ser 5.86 (H) 0.44 - 1.00 mg/dL    Comment: DELTA CHECK NOTED   Calcium 8.7 (L) 8.9 - 10.3 mg/dL   GFR, Estimated 7 (L) >60 mL/min    Comment: (NOTE) Calculated using the CKD-EPI Creatinine Equation (2021)    Anion gap 15 5 - 15    Comment: Performed at [facility] [address], [place], [place] [address]  Magnesium     Status: None   Collection Time: 11/26/20  1:39 AM  Result Value Ref Range   Magnesium 1.9 1.7 - 2.4 mg/dL    Comment: Performed at [facility] [address], [place], [place] [address]  CBC     Status: Abnormal   Collection Time: 11/26/20  1:39 AM  Result Value Ref Range   WBC 31.8 (H) 4.0 - 10.5 K/uL   RBC 2.73 (L) 3.87 - 5.11 MIL/uL   Hemoglobin 7.7 (L) 12.0 - 15.0 g/dL   HCT 23.5 (L) 36.0 - 46.0 %   MCV 86.1 80.0 - 100.0 fL   MCH 28.2 26.0 - 34.0 pg   MCHC 32.8 30.0 - 36.0 g/dL   RDW 15.9 (H) 11.5 - 15.5 %   Platelets 373 150 - 400 K/uL   nRBC  0.0 0.0 - 0.2 %    Comment: Performed at [facility], [place] [address] [place], [place] [address]    MR BRAIN WO CONTRAST  Result Date: 11/24/2020 CLINICAL DATA:  79 year old female with cerebral metastatic disease. Postoperative day 2 ORIF for pathologic right femoral fracture. EXAM: MRI HEAD WITHOUT CONTRAST TECHNIQUE: Multiplanar, multiecho pulse sequences of the brain and surrounding structures  were obtained without intravenous contrast. COMPARISON:  Brain MRI without contrast 01/23/2020. FINDINGS: Brain: Confluent vasogenic edema in the posterior left frontal and superior left parietal lobes surrounding a lobulated 14 mm presumed isointense cerebral metastasis is stable from 11/21/2020. No significant regional mass effect. Other predominantly T2 dark, FLAIR heterogeneous brain masses appear stable, some with associated hemosiderin as before (medial left occipital lobe). And additional lesions are possible given the absence of IV contrast on both of these exams. However, one lesion of the anterior left frontal lobe has substantially regressed in the short-term based on T2 and FLAIR imaging (series 6, image 21) with resolved adjacent edema. No new areas of vasogenic edema. No significant intracranial mass effect. No superimposed restricted diffusion to suggest acute infarction. No ventriculomegaly, extra-axial collection or acute intracranial hemorrhage. Cervicomedullary junction and pituitary are within normal limits. Vascular: Major intracranial vascular flow voids are stable. Dominant right vertebral artery again suspected. Skull and upper cervical spine: Visualized bone marrow signal is within normal limits. Grossly normal visible cervical spine. Sinuses/Orbits: Stable, negative. Other: Mastoids remain clear. Grossly normal visible internal auditory structures. IMPRESSION: 1. Stable metastatic disease related cerebral edema since the noncontrast scan on 11/21/2020. No new or progressive lesions  are evident in the absence of contrast. Postcontrast imaging likely would identify additional small metastases. 2. Regression of a single small anterior left frontal lobe lesion since 11/21/2020 with largely resolved edema there. 3. No new intracranial abnormality. Electronically Signed   By: Genevie Ann M.D.   On: 11/24/2020 15:53    Review of systems not obtained due to patient factors. Blood pressure 131/75, pulse 73, temperature 98.4 F (36.9 C), temperature source Oral, resp. rate 16, height 5\' 2"  (1.575 m), weight 54.9 kg, SpO2 100 %. Patient is awake and aware.  She is pleasantly confused.  Cranial nerve function normal bilaterally.  Motor examination extremities appears symmetric.  Examination head ears eyes nose throat is unremarkable.  Chest and abdomen are stable.  Assessment/Plan: Widely metastatic lung carcinoma.  I agree with contrasted imaging of the brain to determine whether SRS or whole brain radiation is most appropriate.  No indications for direct surgical intervention.  Cassandra Dyer 11/26/2020, 11:20 AM

## 2020-11-26 NOTE — Progress Notes (Signed)
PT Cancellation Note  Patient Details Name: Cassandra Dyer MRN: [id number] DOB: 1941-07-13   Cancelled Treatment:    Reason Eval/Treat Not Completed: Patient at procedure or test/unavailable actively leaving unit with transport staff- per RN, she is going to MRI then straight to HD. Will attempt to f/u if she returns this afternoon and time/schedule allow.    Windell Norfolk, DPT, PN1   Supplemental Physical Therapist [facility]    Pager [phone] Acute Rehab Office [phone]

## 2020-11-26 NOTE — Progress Notes (Addendum)
I called the patient this afternoon because I was not quite sure how to reach her in dialysis, I left her voicemail indicating that she would be a good candidate for stereotactic radiosurgery.  Dr. Trenton Gammon is involved now with her care and has me met with the patient.  Our plan for stereotactic radio surgery would be a fractionated course to the larger lesions, and single fraction treatment to the remaining.  Her recent MRI scan today showed that she has a total of 8 metastatic deposits.  We would recommend that she continue taking dexamethasone 4 mg 3 times a day until we finish this course given the bulky nature of her findings on MRI scan.  I will touch base with her in the morning to confirm her understanding of next steps, our brain oncology navigator is aware of her case as well, as well as the MRI scan findings.  She will be reaching out to coordinate simulation here at [facility] long as an outpatient. We are planning to proceed with CT simulation without IV contrast on Thursday 11/28/20 at 10:30 am, and begin treatment next week.      Carola Rhine, PAC

## 2020-11-26 NOTE — Plan of Care (Signed)
  Problem: Clinical Measurements: Goal: Diagnostic test results will improve Outcome: Progressing   Problem: Activity: Goal: Risk for activity intolerance will decrease Outcome: Progressing   

## 2020-11-26 NOTE — Progress Notes (Addendum)
PROGRESS NOTE  Cassandra Dyer GBT:[id number] DOB: 06-14-1941 DOA: 11/20/2020 PCP: Biagio Borg, MD   LOS: 6 days   Brief narrative: As per HPI,  Cassandra Dyer is a 79 y.o. female with medical history significant of  hypertension, hyperlipidemia, recent diagnosis of lung cancer; remote h/o renal cell carcinoma; COPD on 2L home O2; and ESRD on HD presented to hospital with generalized  weakness, missed HD.   She also complained of right hip pain and ambulatory dysfunction.  She was unable to go to hemodialysis because of pain.  She also complained of generalized weakness, poor appetite and weight loss.  Of note, patient was previously hospitalized from 11/8-24 after missed HD x 1.  She presented with acute on chronic hypoxic respiratory failure and underwent thoracentesis x 3.  2D Echo was performed with grade 2 diastolic dysfunction.  She was found to have a L hilar mass and bronchoscopy with endobronchial [place] was performed.  She was subsequently diagnosed with stage IV (T3, N2, M1a) NSCLC.  MRI brain and PET scan were ordered.   Patient was seen by Rad Onc on 12/2 with the plan for palliative radiation.  She was seen again by the oncology PA on 12/6 with c/o SOB and R hip pain.  CXR showed stable pleural effusions and R hip film with minimally displaced R femoral neck fracture concerning for pathologic fracture.  She was told to stay off the hip and has been in a wheelchair since.    In the ED, patient was noted to be mildly hyperkalemic.  Nephrology was consulted for hemodialysis.  She had missed hemodialysis on Monday and Wednesday.  Patient was then admitted hospital for further evaluation and treatment.  Orthopedics was also consulted for surgical intervention.  Patient underwent right hip hemiarthroplasty on 12/10/ 2021.  Seen by PT postoperatively and recommend home health.  Patient did have cerebral edema with metastatic lesions so radiation oncology was consulted.  Radiation oncology recommended  MRI with gadolinium followed by hemodialysis.   Assessment/Plan:  Principal Problem:   Closed displaced fracture of right femoral neck (HCC) Active Problems:   Essential hypertension   Dyslipidemia   Hyperkalemia, diminished renal excretion   COPD (chronic obstructive pulmonary disease) (HCC)   Adenocarcinoma of left lung, stage 4 (HCC)   Noncompliance of patient with renal dialysis ([place])  Acute right hip pain with pathological femoral neck fracture.  MRI of the hip showed impacted subcapital right femoral neck pathological fracture.  Underwent radical resection of the right femoral head and neck lesion with right hip hemiarthroplasty on 11/22/2020 by orthopedic.  Postoperative course was unremarkable.  Patient was seen by physical therapy and patient has ambulated.  At this time plan is for home with home health.  ESRD on hemodialysis, missed hemodialysis  Continue hemodialysis as per nephrology.    Hyperkalemia Missed hemodialysis.  Resolved.  Continue hemodialysis as outpatient..  Right upper extremity twitching. Secondary to cerebral edema.  Has improved.  On Decadron.  Stage 4 lung cancer, recent diagnosis with cerebral metastasis and vasogenic edema. Patient is supposed to start radiation soon.  MRI of the brain without contrast with 6 metastatic lesions.  Chest x-ray done showed cardiomegaly with central vascular congestion and new confluent patchy bilateral opacities.  Respiratory panel was negative for Covid and influenza.   on dexamethasone due to right upper extremity twitching and MRI showed cerebral edema.  Spoke with radiation oncology in detail yesterday.  Patient is able to go for MRI of  the brain with gadolinium followed by hemodialysis.  Discussed with radiation oncology regarding the safety of gadolinium.  It looks like they are okay with using Gadavist.  Likely hemodialysis today/tomorrow for MRI.  Nephrology, radiation oncology and radiology on board.  Radiation  oncology recommended dexamethasone 4 mg 3 times daily on discharge.  Essential hypertension Continue Coreg, Avapro amlodipine, discontinued Toprol-XL.  Consider discontinuation of Toprol-XL on discharge.  Blood pressure is stable at this time.  Hyperlipidemia -Continue statins  COPD with chronic hypoxic respiratory failure. On 2 L of oxygen by nasal cannula.  Continue bronchodilators.  Compensated  Significant leukocytosis.  Likely chronic/cancer related..  Unlikely to be infective in nature.  No fever or signs of infection.  Leukocytosis has trended up due to initiation of steroids as well  Anemia of chronic kidney disease.  Continue to monitor closely.  Latest hemoglobin of 7.7.  DVT prophylaxis:, heparin subcu  Code Status: DNR  Family Communication:  Unable to reach the patient's friend Eddie Dibbles on the phone.  Status is: Inpatient  Remains inpatient appropriate because: Unsafe d/c plan, IV treatments appropriate due to intensity of illness or inability to take PO and Inpatient level of care appropriate due to severity of illness, status post surgical intervention, however the brain today followed by hemodialysis tomorrow  Dispo: The patient is from: Home              Anticipated d/c is to: CIR as per PT evaluation but has improved to likely home with home health by tomorrow              Anticipated d/c date is: Likely tomorrow.              Patient currently is  medically stable to d/c.  Awaiting for MRI of the brain followed by hemodialysis.  Consultants: Nephrology;  Orthopedics;  Procedures:  Hemodialysis  radical resection of the right femoral head and neck lesion with right hip hemiarthroplasty on 11/22/2020.  Antibiotics:  . None  Subjective:  Patient was seen and examined at bedside.  Denies any nausea vomiting headache fever chills.  Objective: Vitals:   11/25/20 2034 11/26/20 0449  BP: (!) 105/49 (!) 144/64  Pulse: 77 75  Resp: 18 18  Temp: 98.5 F  (36.9 C) 98.4 F (36.9 C)  SpO2: 100% 100%    Intake/Output Summary (Last 24 hours) at 11/26/2020 0806 Last data filed at 11/26/2020 0601 Gross per 24 hour  Intake 810 ml  Output --  Net 810 ml   Filed Weights   11/23/20 2123 11/24/20 2119 11/25/20 2034  Weight: 54.4 kg 54.4 kg 54.9 kg   Body mass index is 22.14 kg/m.   Physical Exam: General: Thinly built, not in obvious distress, alert awake and communicative, on nasal cannula 2 L/min. HENT:   No scleral pallor or icterus noted. Oral mucosa is moist.  Right internal jugular hemodialysis catheter in place. Chest:  .  Diminished breath sounds bilaterally.  No crackles or wheezes noted. CVS: S1 &S2 heard. No murmur.  Regular rate and rhythm. Abdomen: Soft, nontender, nondistended.  Bowel sounds are heard.   Extremities: No cyanosis, clubbing or edema.  Status post right hip surgery with dressing.  Moving extremities. Psych: Alert, awake and communicative.  Normal mood CNS:  No cranial nerve deficits.  Moving extremities. Skin: Warm and dry.  No rashes noted.  Data Review: I have personally reviewed the following laboratory data and studies,  CBC: Recent Labs  Lab 11/22/20 0249 11/23/20  0502 11/24/20 0400 11/25/20 0344 11/26/20 0139  WBC 22.9* 25.6* 24.4* 27.5* 31.8*  HGB 8.3* 8.7* 8.4* 8.5* 7.7*  HCT 27.2* 28.0* 25.7* 26.1* 23.5*  MCV 89.5 88.6 88.3 85.9 86.1  PLT 302 324 363 353 694   Basic Metabolic Panel: Recent Labs  Lab 11/21/20 0117 11/22/20 0249 11/23/20 0502 11/24/20 0400 11/25/20 1607 11/26/20 0139  NA 136 134* 133* 133*  --  130*  K 3.6 4.3 6.1* 3.9 3.8 4.0  CL 95* 96* 95* 95*  --  90*  CO2 24 26 24 26   --  25  GLUCOSE 94 107* 120* 133*  --  181*  BUN 15 5* 20 14  --  71*  CREATININE 3.93* 1.82* 3.91* 2.33*  --  5.86*  CALCIUM 7.9* 7.7* 8.8* 8.2*  --  8.7*  MG  --  1.8 2.0  --   --  1.9   Liver Function Tests: Recent Labs  Lab 11/20/20 1240 11/22/20 0249  AST 34 29  ALT 16 12  ALKPHOS  175* 130*  BILITOT 0.9 0.7  PROT 6.5 5.0*  ALBUMIN 2.2* 1.6*   No results for input(s): LIPASE, AMYLASE in the last 168 hours. No results for input(s): AMMONIA in the last 168 hours. Cardiac Enzymes: No results for input(s): CKTOTAL, CKMB, CKMBINDEX, TROPONINI in the last 168 hours. BNP (last 3 results) No results for input(s): BNP in the last 8760 hours.  ProBNP (last 3 results) No results for input(s): PROBNP in the last 8760 hours.  CBG: Recent Labs  Lab 11/20/20 1746 11/22/20 1519  GLUCAP 141* 101*   Recent Results (from the past 240 hour(s))  Resp Panel by RT-PCR (Flu A&B, Covid) Nasopharyngeal Swab     Status: None   Collection Time: 11/20/20  3:59 PM   Specimen: Nasopharyngeal Swab; Nasopharyngeal(NP) swabs in vial transport medium  Result Value Ref Range Status   SARS Coronavirus 2 by RT PCR NEGATIVE NEGATIVE Final    Comment: (NOTE) SARS-CoV-2 target nucleic acids are NOT DETECTED.  The SARS-CoV-2 RNA is generally detectable in upper respiratory specimens during the acute phase of infection. The lowest concentration of SARS-CoV-2 viral copies this assay can detect is 138 copies/mL. A negative result does not preclude SARS-Cov-2 infection and should not be used as the sole basis for treatment or other patient management decisions. A negative result may occur with  improper specimen collection/handling, submission of specimen other than nasopharyngeal swab, presence of viral mutation(s) within the areas targeted by this assay, and inadequate number of viral copies(<138 copies/mL). A negative result must be combined with clinical observations, patient history, and epidemiological information. The expected result is Negative.  Fact Sheet for Patients:  EntrepreneurPulse.com.au  Fact Sheet for Healthcare Providers:  IncredibleEmployment.be  This test is no t yet approved or cleared by the [place] FDA and  has been  authorized for detection and/or diagnosis of SARS-CoV-2 by FDA under an Emergency Use Authorization (EUA). This EUA will remain  in effect (meaning this test can be used) for the duration of the COVID-19 declaration under Section 564(b)(1) of the Act, 21 U.S.C.section 360bbb-3(b)(1), unless the authorization is terminated  or revoked sooner.       Influenza A by PCR NEGATIVE NEGATIVE Final   Influenza B by PCR NEGATIVE NEGATIVE Final    Comment: (NOTE) The Xpert Xpress SARS-CoV-2/FLU/RSV plus assay is intended as an aid in the diagnosis of influenza from Nasopharyngeal swab specimens and should not be used as a sole basis  for treatment. Nasal washings and aspirates are unacceptable for Xpert Xpress SARS-CoV-2/FLU/RSV testing.  Fact Sheet for Patients: EntrepreneurPulse.com.au  Fact Sheet for Healthcare Providers: IncredibleEmployment.be  This test is not yet approved or cleared by the [place] FDA and has been authorized for detection and/or diagnosis of SARS-CoV-2 by FDA under an Emergency Use Authorization (EUA). This EUA will remain in effect (meaning this test can be used) for the duration of the COVID-19 declaration under Section 564(b)(1) of the Act, 21 U.S.C. section 360bbb-3(b)(1), unless the authorization is terminated or revoked.  Performed at [facility], [place] [address] [place], [place] [address]   Surgical pcr screen     Status: None   Collection Time: 11/21/20  8:22 PM   Specimen: Nasal Mucosa; Nasal Swab  Result Value Ref Range Status   MRSA, PCR NEGATIVE NEGATIVE Final   Staphylococcus aureus NEGATIVE NEGATIVE Final    Comment: (NOTE) The Xpert SA Assay (FDA approved for NASAL specimens in patients 71 years of age and older), is one component of a comprehensive surveillance program. It is not intended to diagnose infection nor to guide or monitor treatment. Performed at [facility], [place] [address], [place],  [address]      Studies: MR BRAIN WO CONTRAST  Result Date: 11/24/2020 CLINICAL DATA:  79 year old female with cerebral metastatic disease. Postoperative day 2 ORIF for pathologic right femoral fracture. EXAM: MRI HEAD WITHOUT CONTRAST TECHNIQUE: Multiplanar, multiecho pulse sequences of the brain and surrounding structures were obtained without intravenous contrast. COMPARISON:  Brain MRI without contrast 01/23/2020. FINDINGS: Brain: Confluent vasogenic edema in the posterior left frontal and superior left parietal lobes surrounding a lobulated 14 mm presumed isointense cerebral metastasis is stable from 11/21/2020. No significant regional mass effect. Other predominantly T2 dark, FLAIR heterogeneous brain masses appear stable, some with associated hemosiderin as before (medial left occipital lobe). And additional lesions are possible given the absence of IV contrast on both of these exams. However, one lesion of the anterior left frontal lobe has substantially regressed in the short-term based on T2 and FLAIR imaging (series 6, image 21) with resolved adjacent edema. No new areas of vasogenic edema. No significant intracranial mass effect. No superimposed restricted diffusion to suggest acute infarction. No ventriculomegaly, extra-axial collection or acute intracranial hemorrhage. Cervicomedullary junction and pituitary are within normal limits. Vascular: Major intracranial vascular flow voids are stable. Dominant right vertebral artery again suspected. Skull and upper cervical spine: Visualized bone marrow signal is within normal limits. Grossly normal visible cervical spine. Sinuses/Orbits: Stable, negative. Other: Mastoids remain clear. Grossly normal visible internal auditory structures. IMPRESSION: 1. Stable metastatic disease related cerebral edema since the noncontrast scan on 11/21/2020. No new or progressive lesions are evident in the absence of contrast. Postcontrast imaging  likely would identify additional small metastases. 2. Regression of a single small anterior left frontal lobe lesion since 11/21/2020 with largely resolved edema there. 3. No new intracranial abnormality. Electronically Signed   By: Genevie Ann M.D.   On: 11/24/2020 15:53     Flora Lipps, MD  [facility] 11/26/2020

## 2020-11-26 NOTE — Progress Notes (Signed)
Pt. Bent left arm during HD tx. Left venous infiltrate. tx paused. Arterial pulls abd flushes without resistance. Venous line moved to right HD catheter. Pt. tx resumed 1:1. Dr. Jonnie Finner aware. Pt. stable

## 2020-11-26 NOTE — Care Management Important Message (Signed)
Important Message  Patient Details  Name: Cassandra Dyer MRN: [id number] Date of Birth: 05/06/1941   Medicare Important Message Given:  Yes     Barb Merino [place] 11/26/2020, 12:47 PM

## 2020-11-26 NOTE — Plan of Care (Signed)
  Problem: Clinical Measurements: Goal: Diagnostic test results will improve Outcome: Progressing   Problem: Activity: Goal: Risk for activity intolerance will decrease Outcome: Progressing   Problem: Education: Goal: Knowledge of disease and its progression will improve Outcome: Progressing   Problem: Fluid Volume: Goal: Compliance with measures to maintain balanced fluid volume will improve Outcome: Progressing

## 2020-11-27 ENCOUNTER — Encounter: Payer: Self-pay | Admitting: *Deleted

## 2020-11-27 LAB — COMPREHENSIVE METABOLIC PANEL
ALT: 13 U/L (ref 0–44)
AST: 47 U/L — ABNORMAL HIGH (ref 15–41)
Albumin: 1.5 g/dL — ABNORMAL LOW (ref 3.5–5.0)
Alkaline Phosphatase: 171 U/L — ABNORMAL HIGH (ref 38–126)
Anion gap: 12 (ref 5–15)
BUN: 46 mg/dL — ABNORMAL HIGH (ref 8–23)
CO2: 27 mmol/L (ref 22–32)
Calcium: 7.9 mg/dL — ABNORMAL LOW (ref 8.9–10.3)
Chloride: 95 mmol/L — ABNORMAL LOW (ref 98–111)
Creatinine, Ser: 3.86 mg/dL — ABNORMAL HIGH (ref 0.44–1.00)
GFR, Estimated: 11 mL/min — ABNORMAL LOW (ref >60)
Glucose, Bld: 167 mg/dL — ABNORMAL HIGH (ref 70–99)
Potassium: 3.2 mmol/L — ABNORMAL LOW (ref 3.5–5.1)
Sodium: 134 mmol/L — ABNORMAL LOW (ref 135–145)
Total Bilirubin: 0.6 mg/dL (ref 0.3–1.2)
Total Protein: 4.7 g/dL — ABNORMAL LOW (ref 6.5–8.1)

## 2020-11-27 LAB — CBC
HCT: 22 % — ABNORMAL LOW (ref 36.0–46.0)
Hemoglobin: 7.1 g/dL — ABNORMAL LOW (ref 12.0–15.0)
MCH: 28.5 pg (ref 26.0–34.0)
MCHC: 32.3 g/dL (ref 30.0–36.0)
MCV: 88.4 fL (ref 80.0–100.0)
Platelets: 339 10*3/uL (ref 150–400)
RBC: 2.49 MIL/uL — ABNORMAL LOW (ref 3.87–5.11)
RDW: 15.9 % — ABNORMAL HIGH (ref 11.5–15.5)
WBC: 19.8 10*3/uL — ABNORMAL HIGH (ref 4.0–10.5)
nRBC: 0 % (ref 0.0–0.2)

## 2020-11-27 LAB — MAGNESIUM: Magnesium: 1.8 mg/dL (ref 1.7–2.4)

## 2020-11-27 LAB — PHOSPHORUS: Phosphorus: 1.4 mg/dL — ABNORMAL LOW (ref 2.5–4.6)

## 2020-11-27 MED ORDER — HEPARIN SODIUM (PORCINE) 1000 UNIT/ML IJ SOLN
INTRAMUSCULAR | Status: AC
  Filled 2020-11-27: qty 3

## 2020-11-27 MED ORDER — LIDOCAINE HCL (PF) 1 % IJ SOLN: 5.0000 mL | INTRAMUSCULAR | Status: DC | PRN

## 2020-11-27 MED ORDER — SODIUM CHLORIDE 0.9 % IV SOLN: 100.0000 mL | INTRAVENOUS | Status: DC | PRN

## 2020-11-27 MED ORDER — PANTOPRAZOLE SODIUM 40 MG PO TBEC
40.0000 mg | DELAYED_RELEASE_TABLET | Freq: Every day | ORAL | Status: DC
  Administered 2020-11-27: 40 mg via ORAL
  Filled 2020-11-27: qty 1

## 2020-11-27 MED ORDER — LIDOCAINE-PRILOCAINE 2.5-2.5 % EX CREA: 1.0000 "application " | TOPICAL_CREAM | CUTANEOUS | Status: DC | PRN

## 2020-11-27 MED ORDER — DEXAMETHASONE 4 MG PO TABS
4.0000 mg | ORAL_TABLET | Freq: Three times a day (TID) | ORAL | Status: DC
  Administered 2020-11-27 – 2020-11-28 (×3): 4 mg via ORAL
  Filled 2020-11-27 (×5): qty 1

## 2020-11-27 MED ORDER — PENTAFLUOROPROP-TETRAFLUOROETH EX AERO: 1.0000 "application " | INHALATION_SPRAY | CUTANEOUS | Status: DC | PRN

## 2020-11-27 MED ORDER — HEPARIN SODIUM (PORCINE) 1000 UNIT/ML DIALYSIS
1000.0000 [IU] | INTRAMUSCULAR | Status: DC | PRN
  Administered 2020-11-27: 1000 [IU] via INTRAVENOUS_CENTRAL

## 2020-11-27 MED ORDER — POTASSIUM CHLORIDE CRYS ER 20 MEQ PO TBCR: 20.0000 meq | EXTENDED_RELEASE_TABLET | Freq: Once | ORAL | Status: DC

## 2020-11-27 MED ORDER — POTASSIUM PHOSPHATES 15 MMOLE/5ML IV SOLN
20.0000 mmol | Freq: Once | INTRAVENOUS | Status: AC
  Administered 2020-11-27: 20 mmol via INTRAVENOUS
  Filled 2020-11-27: qty 6.67

## 2020-11-27 MED ORDER — HEPARIN SODIUM (PORCINE) 1000 UNIT/ML DIALYSIS: 2000.0000 [IU] | Freq: Once | INTRAMUSCULAR | Status: DC

## 2020-11-27 MED ORDER — ALTEPLASE 2 MG IJ SOLR: 2.0000 mg | Freq: Once | INTRAMUSCULAR | Status: DC | PRN

## 2020-11-27 NOTE — Progress Notes (Signed)
Subjective: had HD yest 1p- 4pm, seen in room, no c/o today.   Objective Vital signs in last 24 hours: Vitals:   11/26/20 1635 11/26/20 1714 11/26/20 2007 11/27/20 0503  BP: 107/71 (!) 103/45 (!) 101/48 (!) 115/46  Pulse:  75 72 71  Resp: 16 16 18 16   Temp: 98.3 F (36.8 C) 98.9 F (37.2 C) 98.4 F (36.9 C) 98 F (36.7 C)  TempSrc: Oral Oral Oral Oral  SpO2: 100% 100% 100% 100%  Weight: 51.7 kg   53.4 kg  Height:       Weight change: -2.7 kg  Physical Exam General: Alert, thin, ill-appearing elderly female in NAD Heart:RRR, no mrg Lungs:CTAB, nml WOB on 2L via [place] Abdomen:soft, NTND Extremities:no LE edema Dialysis Access: LU AVF +b/t, TDC  OP HD:  [facility] MWF    4h  48.5kg  2/2 bath Hep 2000  TDC/ LUA AVF  Venofer 50 mg, no ESA secondary to cancer, Access =left BC aVF placed 08/07/2020, right IJ PermCath  Assessment/Plan: 1. Hyperkalemia2/48missed dialysis &dietary indiscretions.Improved with HD.  Last K 4.0 2. ESRD-on HD MWF. HD tues/ Wed due to MRI contrast yest before HD. HD x 2 post MRI contrast should remove the bulk of the contrast, no further serial HD recommended. Plan next HD Friday as OP.  3. HD access - AVF ready for use in11/25/2021 per outpatient [facility] note. Used AVF on 12/14, worked well, using TDC today since back-to-back  4. Lung cancer w/ brain mets=followed by Dr. Earlie Server oncology per patient has appointment December 14 to discuss. To start radiation soon. MRI brain12/09 showed 6 lesions. MRI w/ contrast done to further characterize the lesions.   5. Hypertension/volume- BP's okay, cont meds. Does not appear grossly volume overloaded. Close to EDW, UF as tolerated.   6. Anemia -Hgb 7.7 <8.5.>  7.7 .no ESA d/t malignancy. Continue weekly iron. Transfuse if Hgb<7. 7. Metabolic bone disease -CCaand phos in goal. Not on binders or VDRA. Follow labs.  8. Right femoral neck fracture- MRI hip showed impacted subcapital R femoral neck frx.Status  post 12/10 right hip hemiarthroplasty, plan Per ortho.  9. Leukocytosis possible reactive in nature,Afebrile, work-up per admit 10. Dispo - SNF vs CIR placement.  Kelly Splinter, MD 11/27/2020, 8:55 AM      Labs: Basic Metabolic Panel: Recent Labs  Lab 11/24/20 0400 11/25/20 1607 11/26/20 0139 11/27/20 0203  NA 133*  --  130* 134*  K 3.9 3.8 4.0 3.2*  CL 95*  --  90* 95*  CO2 26  --  25 27  GLUCOSE 133*  --  181* 167*  BUN 14  --  71* 46*  CREATININE 2.33*  --  5.86* 3.86*  CALCIUM 8.2*  --  8.7* 7.9*  PHOS  --   --   --  1.4*   Liver Function Tests: Recent Labs  Lab 11/20/20 1240 11/22/20 0249 11/27/20 0203  AST 34 29 47*  ALT 16 12 13   ALKPHOS 175* 130* 171*  BILITOT 0.9 0.7 0.6  PROT 6.5 5.0* 4.7*  ALBUMIN 2.2* 1.6* 1.5*   No results for input(s): LIPASE, AMYLASE in the last 168 hours. No results for input(s): AMMONIA in the last 168 hours. CBC: Recent Labs  Lab 11/23/20 0502 11/24/20 0400 11/25/20 0344 11/26/20 0139 11/27/20 0203  WBC 25.6* 24.4* 27.5* 31.8* 19.8*  HGB 8.7* 8.4* 8.5* 7.7* 7.1*  HCT 28.0* 25.7* 26.1* 23.5* 22.0*  MCV 88.6 88.3 85.9 86.1 88.4  PLT 324 363 353  373 339   Cardiac Enzymes: No results for input(s): CKTOTAL, CKMB, CKMBINDEX, TROPONINI in the last 168 hours. CBG: Recent Labs  Lab 11/20/20 1746 11/22/20 1519  GLUCAP 141* 101*    Studies/Results: MR BRAIN W WO CONTRAST  Result Date: 11/26/2020 CLINICAL DATA:  Metastatic lung cancer, abnormal noncontrast brain MRI EXAM: MRI HEAD WITHOUT AND WITH CONTRAST TECHNIQUE: Multiplanar, multiecho pulse sequences of the brain and surrounding structures were obtained without and with intravenous contrast. CONTRAST:  25mL GADAVIST GADOBUTROL 1 MMOL/ML IV SOLN COMPARISON:  Multiple recent noncontrast studies FINDINGS: Brain: Multiple lesions are again identified and are annotated on series 1100. There has been no change in size since recent prior imaging. A total of 8 lesions are  identified on images 111, 146, 160, 177, 205, 217, 224, and 229. In comparison to the noncontrast imaging, this reflects identification of an additional left frontal and a right thalamic metastasis. The left occipital lesion demonstrates intrinsic T1 shortening and corresponding susceptibility and could conceivably reflect a cavernous malformation but given associated edema, metastasis is more likely. Stable associated edema. No significant mass effect. Incidental note is made of a left frontal developmental venous anomaly with adjacent gliosis. No acute infarction hemorrhage. Vascular: Major vessel flow voids at the skull base are preserved. Skull and upper cervical spine: No new findings. Sinuses/Orbits: No new findings. Other: Sella is unremarkable.  Mastoid air cells are clear. IMPRESSION: 8 metastatic lesions with stable edema. Left frontal and right thalamic lesions identified in addition to those on prior noncontrast imaging. Electronically Signed   By: Macy Mis M.D.   On: 11/26/2020 13:29   Medications: . sodium chloride Stopped (11/23/20 0403)  . iron sucrose    . methocarbamol (ROBAXIN) IV    . potassium PHOSPHATE IVPB (in mmol)     . (feeding supplement) PROSource Plus  30 mL Oral BID BM  . amLODipine  10 mg Oral Daily  . bupivacaine liposome  20 mL Infiltration Once  . carvedilol  12.5 mg Oral BID WC  . Chlorhexidine Gluconate Cloth  6 each Topical Daily  . dexamethasone  4 mg Oral Q8H  . docusate sodium  100 mg Oral BID  . enoxaparin (LOVENOX) injection  30 mg Subcutaneous Q24H  . feeding supplement (NEPRO CARB STEADY)  237 mL Oral BID BM  . irbesartan  150 mg Oral Daily  . multivitamin  1 tablet Oral QHS  . pantoprazole  40 mg Oral Daily  . rosuvastatin  10 mg Oral Daily  . tranexamic acid (CYKLOKAPRON) topical - INTRAOP  2,000 mg Topical Once

## 2020-11-27 NOTE — Progress Notes (Signed)
Oncology Nurse Navigator Documentation  Oncology Nurse Navigator Flowsheets 11/27/2020  Abnormal Finding Date 11/02/2020  Confirmed Diagnosis Date 11/05/2020  Diagnosis Status Pending Molecular Studies  Navigator Follow Up Date: 12/04/2020  Navigator Follow Up Reason: Molecular Testing  Navigator Location [facility]  Navigator Encounter Type Other:  [facility] Date 11/14/2020  Multidisiplinary Clinic Type Thoracic  Patient Visit Type Other  Treatment Phase Other  Barriers/Navigation Needs Coordination of Care  Interventions Coordination of Care  Acuity Level 2-Minimal Needs (1-2 Barriers Identified)  Coordination of Care Other

## 2020-11-27 NOTE — Progress Notes (Signed)
PROGRESS NOTE    Cassandra Dyer  DPO:[id number] DOB: December 18, 1940 DOA: 11/20/2020 PCP: Biagio Borg, MD   Brief Narrative: 79 year old with past medical history significant for hypertension, hyperlipidemia, recent diagnosis of lung cancer, remote history of renal cell carcinoma, COPD on 2 L of oxygen, ESRD on hemodialysis presented to hospital with generalized weakness, she missed hemodialysis.  She also complaining of right hip pain and ambulatory dysfunction.  She was not able to go to hemodialysis because of pain.  She also complained of generalized weakness, poor appetite and weight loss.    Patient was found to have left hilar mass and bronchoscopy with endobronchial ultrasound was performed.  She was subsequently diagnosed with a stage IV NSCLC.  MRI of the brain and PET scan were ordered.  Patient was seen by radiation oncology on 12/2 with the plan for palliative radiation.  She was seen again by oncology PA on 12/6 she was complaining of shortness of breath and right hip pain.  Chest x-ray showed a stable pleural effusion and right hip film with minimal displaced right femoral neck fracture concerning for pathologic fracture.  She was told to stay off of her hip and has been in wheelchair since then.  In the ED patient was noted to be mildly hyperkalemic.  Nephrology was consulted for hemodialysis.  She had missed hemodialysis on Monday and Wednesday.  Patient was then admitted to the hospital for further evaluation and treatment.  Orthopedics was also consulted for surgical intervention.  Patient underwent right hip hemiarthroplasty on 11/22/2020.  Seen by PT postoperative and recommended home health.  Patient did have cerebral edema with metastatic lesions so radiation oncology was consulted.  Radiation oncology recommended MRI with gadolinium followed by hemodialysis. We will get her second hemodialysis treatment today after MRI with gadolinium was done yesterday. Plan is to discharge tomorrow  and she will follow-up for radiation and stimulation tomorrow   Assessment & Plan:   Principal Problem:   Closed displaced fracture of right femoral neck (HCC) Active Problems:   Essential hypertension   Dyslipidemia   Hyperkalemia, diminished renal excretion   COPD (chronic obstructive pulmonary disease) (HCC)   Adenocarcinoma of left lung, stage 4 (HCC)   Noncompliance of patient with renal dialysis ([place])  1-Acute Right Hip pain with pathological femoral neck fracture: MRI of the hip showed impacted subcapital right femoral neck pathological fracture. Underwent radical resection of the right femoral head and neck lesion with right hip hemiarthroplasty on 11/22/2020 by orthopedic PT recommended home health PT  2-ESRD on hemodialysis: She has missed hemodialysis prior to admission. Plan to dialyze again today after MRI with gadolinium.  3-Hyperkalemia corrected with hemodialysis.  4-Right upper extremity twitching: Improved  5-Stage IV lung cancer recent diagnosis with cerebral metastasis and vasogenic edema: MRI of the brain without contrast with 6 metastatic lesion. Started Decadron 4 mg 3 times daily. MRI with gadolinium showed 8 metastatic lesions. Plan to discharge tomorrow, she will follow-up with urology oncology tomorrow for  stimulation  6-COPD with chronic hypoxic respiratory failure: Continue with bronchodilators On 2 L of oxygen chronically at home  Leukocytosis: Trending down.  Anemia of chronic kidney disease continue to monitor hemoglobin Hypokalemia: Corrected with hemodialysis. Hypophosphatemia: Replete IV.    Nutrition Problem: Inadequate oral intake Etiology: poor appetite    Signs/Symptoms: per patient/family report    Interventions: Nepro shake,Prostat,MVI  Estimated body mass index is 21.53 kg/m as calculated from the following:   Height as of this encounter: 5'  2" (1.575 m).   Weight as of this encounter: 53.4 kg.   DVT  prophylaxis: Lovenox Code Status: DNR Family Communication: Disposition Plan:  Status is: Inpatient  Remains inpatient appropriate because:IV treatments appropriate due to intensity of illness or inability to take PO   Dispo: The patient is from: Home              Anticipated d/c is to: Home              Anticipated d/c date is: 1 day              Patient currently is not medically stable to d/c.        Consultants:   Radiation oncologist  Nephrology   Subjective: Patient is alert and conversant, denies worsening headache.  Denies abdominal pain.  Objective: Vitals:   11/26/20 1635 11/26/20 1714 11/26/20 2007 11/27/20 0503  BP: 107/71 (!) 103/45 (!) 101/48 (!) 115/46  Pulse:  75 72 71  Resp: 16 16 18 16   Temp: 98.3 F (36.8 C) 98.9 F (37.2 C) 98.4 F (36.9 C) 98 F (36.7 C)  TempSrc: Oral Oral Oral Oral  SpO2: 100% 100% 100% 100%  Weight: 51.7 kg   53.4 kg  Height:        Intake/Output Summary (Last 24 hours) at 11/27/2020 0759 Last data filed at 11/27/2020 0600 Gross per 24 hour  Intake 360 ml  Output 719 ml  Net -359 ml   Filed Weights   11/26/20 1315 11/26/20 1635 11/27/20 0503  Weight: 52.2 kg 51.7 kg 53.4 kg    Examination:  General exam: Appears calm and comfortable  Respiratory system: Clear to auscultation. Respiratory effort normal. Cardiovascular system: S1 & S2 heard, RRR. No JVD, murmurs, rubs, gallops or clicks. No pedal edema. Gastrointestinal system: Abdomen is nondistended, soft and nontender. No organomegaly or masses felt. Normal bowel sounds heard. Central nervous system: Alert and oriented. No focal neurological deficits. Extremities: Symmetric 5 x 5 power.   Data Reviewed: I have personally reviewed following labs and imaging studies  CBC: Recent Labs  Lab 11/23/20 0502 11/24/20 0400 11/25/20 0344 11/26/20 0139 11/27/20 0203  WBC 25.6* 24.4* 27.5* 31.8* 19.8*  HGB 8.7* 8.4* 8.5* 7.7* 7.1*  HCT 28.0* 25.7* 26.1*  23.5* 22.0*  MCV 88.6 88.3 85.9 86.1 88.4  PLT 324 363 353 373 811   Basic Metabolic Panel: Recent Labs  Lab 11/22/20 0249 11/23/20 0502 11/24/20 0400 11/25/20 1607 11/26/20 0139 11/27/20 0203  NA 134* 133* 133*  --  130* 134*  K 4.3 6.1* 3.9 3.8 4.0 3.2*  CL 96* 95* 95*  --  90* 95*  CO2 26 24 26   --  25 27  GLUCOSE 107* 120* 133*  --  181* 167*  BUN 5* 20 14  --  71* 46*  CREATININE 1.82* 3.91* 2.33*  --  5.86* 3.86*  CALCIUM 7.7* 8.8* 8.2*  --  8.7* 7.9*  MG 1.8 2.0  --   --  1.9 1.8  PHOS  --   --   --   --   --  1.4*   GFR: Estimated Creatinine Clearance: 9.7 mL/min (A) (by C-G formula based on SCr of 3.86 mg/dL (H)). Liver Function Tests: Recent Labs  Lab 11/20/20 1240 11/22/20 0249 11/27/20 0203  AST 34 29 47*  ALT 16 12 13   ALKPHOS 175* 130* 171*  BILITOT 0.9 0.7 0.6  PROT 6.5 5.0* 4.7*  ALBUMIN 2.2* 1.6* 1.5*  No results for input(s): LIPASE, AMYLASE in the last 168 hours. No results for input(s): AMMONIA in the last 168 hours. Coagulation Profile: No results for input(s): INR, PROTIME in the last 168 hours. Cardiac Enzymes: No results for input(s): CKTOTAL, CKMB, CKMBINDEX, TROPONINI in the last 168 hours. BNP (last 3 results) No results for input(s): PROBNP in the last 8760 hours. HbA1C: No results for input(s): HGBA1C in the last 72 hours. CBG: Recent Labs  Lab 11/20/20 1746 11/22/20 1519  GLUCAP 141* 101*   Lipid Profile: No results for input(s): CHOL, HDL, LDLCALC, TRIG, CHOLHDL, LDLDIRECT in the last 72 hours. Thyroid Function Tests: No results for input(s): TSH, T4TOTAL, FREET4, T3FREE, THYROIDAB in the last 72 hours. Anemia Panel: No results for input(s): VITAMINB12, FOLATE, FERRITIN, TIBC, IRON, RETICCTPCT in the last 72 hours. Sepsis Labs: No results for input(s): PROCALCITON, LATICACIDVEN in the last 168 hours.  Recent Results (from the past 240 hour(s))  Resp Panel by RT-PCR (Flu A&B, Covid) Nasopharyngeal Swab     Status: None    Collection Time: 11/20/20  3:59 PM   Specimen: Nasopharyngeal Swab; Nasopharyngeal(NP) swabs in vial transport medium  Result Value Ref Range Status   SARS Coronavirus 2 by RT PCR NEGATIVE NEGATIVE Final    Comment: (NOTE) SARS-CoV-2 target nucleic acids are NOT DETECTED.  The SARS-CoV-2 RNA is generally detectable in upper respiratory specimens during the acute phase of infection. The lowest concentration of SARS-CoV-2 viral copies this assay can detect is 138 copies/mL. A negative result does not preclude SARS-Cov-2 infection and should not be used as the sole basis for treatment or other patient management decisions. A negative result may occur with  improper specimen collection/handling, submission of specimen other than nasopharyngeal swab, presence of viral mutation(s) within the areas targeted by this assay, and inadequate number of viral copies(<138 copies/mL). A negative result must be combined with clinical observations, patient history, and epidemiological information. The expected result is Negative.  Fact Sheet for Patients:  EntrepreneurPulse.com.au  Fact Sheet for Healthcare Providers:  IncredibleEmployment.be  This test is no t yet approved or cleared by the [place] FDA and  has been authorized for detection and/or diagnosis of SARS-CoV-2 by FDA under an Emergency Use Authorization (EUA). This EUA will remain  in effect (meaning this test can be used) for the duration of the COVID-19 declaration under Section 564(b)(1) of the Act, 21 U.S.C.section 360bbb-3(b)(1), unless the authorization is terminated  or revoked sooner.       Influenza A by PCR NEGATIVE NEGATIVE Final   Influenza B by PCR NEGATIVE NEGATIVE Final    Comment: (NOTE) The Xpert Xpress SARS-CoV-2/FLU/RSV plus assay is intended as an aid in the diagnosis of influenza from Nasopharyngeal swab specimens and should not be used as a sole basis for treatment.  Nasal washings and aspirates are unacceptable for Xpert Xpress SARS-CoV-2/FLU/RSV testing.  Fact Sheet for Patients: EntrepreneurPulse.com.au  Fact Sheet for Healthcare Providers: IncredibleEmployment.be  This test is not yet approved or cleared by the [place] FDA and has been authorized for detection and/or diagnosis of SARS-CoV-2 by FDA under an Emergency Use Authorization (EUA). This EUA will remain in effect (meaning this test can be used) for the duration of the COVID-19 declaration under Section 564(b)(1) of the Act, 21 U.S.C. section 360bbb-3(b)(1), unless the authorization is terminated or revoked.  Performed at [facility], [place] [address], [place], [place] [address]   Surgical pcr screen     Status: None  Collection Time: 11/21/20  8:22 PM   Specimen: Nasal Mucosa; Nasal Swab  Result Value Ref Range Status   MRSA, PCR NEGATIVE NEGATIVE Final   Staphylococcus aureus NEGATIVE NEGATIVE Final    Comment: (NOTE) The Xpert SA Assay (FDA approved for NASAL specimens in patients 31 years of age and older), is one component of a comprehensive surveillance program. It is not intended to diagnose infection nor to guide or monitor treatment. Performed at [facility], [place] [address] [place], [place] [address]          Radiology Studies: MR BRAIN W WO CONTRAST  Result Date: 11/26/2020 CLINICAL DATA:  Metastatic lung cancer, abnormal noncontrast brain MRI EXAM: MRI HEAD WITHOUT AND WITH CONTRAST TECHNIQUE: Multiplanar, multiecho pulse sequences of the brain and surrounding structures were obtained without and with intravenous contrast. CONTRAST:  39mL GADAVIST GADOBUTROL 1 MMOL/ML IV SOLN COMPARISON:  Multiple recent noncontrast studies FINDINGS: Brain: Multiple lesions are again identified and are annotated on series 1100. There has been no change in size since recent prior imaging. A total of 8 lesions are identified on  images 111, 146, 160, 177, 205, 217, 224, and 229. In comparison to the noncontrast imaging, this reflects identification of an additional left frontal and a right thalamic metastasis. The left occipital lesion demonstrates intrinsic T1 shortening and corresponding susceptibility and could conceivably reflect a cavernous malformation but given associated edema, metastasis is more likely. Stable associated edema. No significant mass effect. Incidental note is made of a left frontal developmental venous anomaly with adjacent gliosis. No acute infarction hemorrhage. Vascular: Major vessel flow voids at the skull base are preserved. Skull and upper cervical spine: No new findings. Sinuses/Orbits: No new findings. Other: Sella is unremarkable.  Mastoid air cells are clear. IMPRESSION: 8 metastatic lesions with stable edema. Left frontal and right thalamic lesions identified in addition to those on prior noncontrast imaging. Electronically Signed   By: Macy Mis M.D.   On: 11/26/2020 13:29        Scheduled Meds: . (feeding supplement) PROSource Plus  30 mL Oral BID BM  . amLODipine  10 mg Oral Daily  . bupivacaine liposome  20 mL Infiltration Once  . carvedilol  12.5 mg Oral BID WC  . Chlorhexidine Gluconate Cloth  6 each Topical Daily  . docusate sodium  100 mg Oral BID  . enoxaparin (LOVENOX) injection  30 mg Subcutaneous Q24H  . feeding supplement (NEPRO CARB STEADY)  237 mL Oral BID BM  . irbesartan  150 mg Oral Daily  . multivitamin  1 tablet Oral QHS  . rosuvastatin  10 mg Oral Daily  . tranexamic acid (CYKLOKAPRON) topical - INTRAOP  2,000 mg Topical Once   Continuous Infusions: . sodium chloride Stopped (11/23/20 0403)  . iron sucrose    . methocarbamol (ROBAXIN) IV       LOS: 7 days    Time spent: 35 minutes.     Elmarie Shiley, MD [facility]   If 7PM-7AM, please contact night-coverage [email or web address]  11/27/2020, 7:59 AM

## 2020-11-27 NOTE — Progress Notes (Signed)
I was able to speak with the patient this morning about her MRI results, and the plan for fractionated SRS to the larger lesions in single fraction to the smaller.  We discussed the delivery and logistics of radiotherapy as well as the risks of short and long-term effects of treatment.  She is interested in proceeding and gets this verbal consent.  She will signed written consent tomorrow when she comes at 10:30 AM for simulation in our department.  She is aware of these appointments but these will also be on her AVS.  We expect that she would proceed with treatment next Thursday, and complete her course before the end of the year.    Carola Rhine, PAC

## 2020-11-27 NOTE — Progress Notes (Signed)
Renal Navigator spoke with Attending, Nephrologist and patient regarding HD today and discharge plan. Attending states discharge plan is dependent on how many days of serial dialysis needed after MRI with contrast yesterday. Nephrology states patient needs HD again today, on her regular schedule, and then again on Friday (regular schedule). Therefore, patient could be discharged to outpatient HD today. After discussion with patient, she does not feel comfortable with discharge to outpatient clinic today and will have HD here prior to discharge today. Navigator has discussed with team.  Alphonzo Cruise, [place] Renal Navigator [phone]

## 2020-11-27 NOTE — Progress Notes (Signed)
Physical Therapy Treatment Patient Details Name: Cassandra Dyer MRN: [id number] DOB: 17-Nov-1941 Today's Date: 11/27/2020    History of Present Illness 79 y.o. female admitted with weakness, and missed HD session due to Rt hip pain.  CXR  on 12/6 showed minimally displaced Rt femoral neck fracture concerning for pathological fx, and was waiting on appointment with orthopedist.  MRI showed pathological fractures bil. femoral necks. SHe underwent THA, direct anterior approach Rt hip.  PMH includes: HTN, HLD, recent dx Lung CA, remote h/o renal cell CA, COPD on 2L 02, ESRD on HD    PT Comments    Pt reports feeling well and agreeable to participate with therapy. Pt rose from EOB and began ambulating, however made it ~3 ft before she reported increased dizziness and required assist to step backwards to EOB. Once EOB pt was given time to rest. When dizziness subsided pt attempted side steps towards EOB, however dizziness again became overwhelming, requiring pt to sit again. She was able to then scoot towards [facility] prior to laying down. Once supine in bed pt reported symptoms resolved and was able to participate in LE HEP. Will continue to follow acutely for mobility progression.     Follow Up Recommendations  Home health PT;Supervision for mobility/OOB     Equipment Recommendations  Rolling walker with 5" wheels;3in1 (PT)    Recommendations for Other Services       Precautions / Restrictions Precautions Precautions: Fall Precaution Comments: watch O2 sats Restrictions Weight Bearing Restrictions: Yes RLE Weight Bearing: Weight bearing as tolerated    Mobility  Bed Mobility Overal bed mobility: Needs Assistance Bed Mobility: Supine to Sit     Supine to sit: HOB elevated;Supervision Sit to supine: Min assist   General bed mobility comments: Increased time and effort for bed mobilities with HOB elevated and use of rail. Min cueing for sequensing to to progress LE off EOB. Min A to  progress LE into bed as pt was feeling dizzy and fatigued.  Transfers Overall transfer level: Needs assistance Equipment used: Rolling walker (2 wheeled) Transfers: Sit to/from Stand Sit to Stand: Min guard         General transfer comment: VC's for hand placement on seated surface for safety. Min guard assist provided. Pt with slow guarded movements  Ambulation/Gait Ambulation/Gait assistance: Min assist Gait Distance (Feet): 3 Feet Assistive device: Rolling walker (2 wheeled) Gait Pattern/deviations: Decreased stride length;Step-through pattern;Trunk flexed Gait velocity: decreased   General Gait Details: Pt ambualted from EOB 3 ft forwards before stating she could not go any farther secodnary to dizziness. She was able to take slow steps backwards towards bed with min A for RW management and stability.   Stairs             Wheelchair Mobility    Modified Rankin (Stroke Patients Only)       Balance Overall balance assessment: Needs assistance Sitting-balance support: No upper extremity supported;Single extremity supported Sitting balance-Leahy Scale: Fair     Standing balance support: Bilateral upper extremity supported;During functional activity Standing balance-Leahy Scale: Poor Standing balance comment: RW and exteranl assist required today                            Cognition Arousal/Alertness: Awake/alert Behavior During Therapy: WFL for tasks assessed/performed Overall Cognitive Status: Within Functional Limits for tasks assessed  General Comments: very motivated      Exercises General Exercises - Lower Extremity Ankle Circles/Pumps: AROM;Both;20 reps;Supine Heel Slides: 10 reps;AROM;Right;Supine Hip ABduction/ADduction: 10 reps;AROM;Right;Supine Straight Leg Raises: 10 reps;AROM;Right;Supine    General Comments General comments (skin integrity, edema, etc.): on 2L O2. SpO2 remained >99%  during session.      Pertinent Vitals/Pain Pain Assessment: Faces Faces Pain Scale: Hurts little more Pain Location: "I don't have any pain." Pain Descriptors / Indicators: Grimacing;Discomfort;Sore Pain Intervention(s): Monitored during session;Limited activity within patient's tolerance    Home Living                      Prior Function            PT Goals (current goals can now be found in the care plan section) Acute Rehab PT Goals Patient Stated Goal: return to independence- do whatever rehab needed prior to returning home PT Goal Formulation: With patient Time For Goal Achievement: 12/08/20 Potential to Achieve Goals: Good Progress towards PT goals: Not progressing toward goals - comment (weak and dizzy today. Possibly due to Hgb at 7.1)    Frequency    Min 5X/week      PT Plan Current plan remains appropriate    Co-evaluation              AM-PAC PT "6 Clicks" Mobility   Outcome Measure  Help needed turning from your back to your side while in a flat bed without using bedrails?: A Little Help needed moving from lying on your back to sitting on the side of a flat bed without using bedrails?: A Little Help needed moving to and from a bed to a chair (including a wheelchair)?: A Little Help needed standing up from a chair using your arms (e.g., wheelchair or bedside chair)?: A Little Help needed to walk in hospital room?: A Little Help needed climbing 3-5 steps with a railing? : A Little 6 Click Score: 18    End of Session Equipment Utilized During Treatment: Gait belt;Oxygen Activity Tolerance: Treatment limited secondary to medical complications (Comment) (dizziness.) Patient left: with call bell/phone within reach;in bed Nurse Communication: Mobility status;Other (comment) (dizziness during session) PT Visit Diagnosis: Other abnormalities of gait and mobility (R26.89);Pain;Difficulty in walking, not elsewhere classified (R26.2);Muscle weakness  (generalized) (M62.81) Pain - Right/Left: Right Pain - part of body: Hip     Time: 4496-7591 PT Time Calculation (min) (ACUTE ONLY): 33 min  Charges:  $Therapeutic Exercise: 8-22 mins $Therapeutic Activity: 8-22 mins                     Benjiman Core, [place] Pager [phone] Acute Rehab   Allena Katz 11/27/2020, 12:11 PM

## 2020-11-28 ENCOUNTER — Ambulatory Visit
Admission: RE | Admit: 2020-11-28 | Discharge: 2020-11-28 | Disposition: A | Discharge status: Home / Self Care | Payer: Medicare Other | Source: Ambulatory Visit | Attending: Radiation Oncology | Admitting: Radiation Oncology

## 2020-11-28 ENCOUNTER — Other Ambulatory Visit: Payer: Self-pay

## 2020-11-28 ENCOUNTER — Encounter: Payer: Self-pay | Admitting: Internal Medicine

## 2020-11-28 DIAGNOSIS — C3432 Malignant neoplasm of lower lobe, left bronchus or lung: Secondary | ICD-10-CM | POA: Insufficient documentation

## 2020-11-28 DIAGNOSIS — Z510 Encounter for antineoplastic radiation therapy: Secondary | ICD-10-CM | POA: Insufficient documentation

## 2020-11-28 DIAGNOSIS — C641 Malignant neoplasm of right kidney, except renal pelvis: Secondary | ICD-10-CM | POA: Diagnosis not present

## 2020-11-28 DIAGNOSIS — C7931 Secondary malignant neoplasm of brain: Secondary | ICD-10-CM | POA: Diagnosis not present

## 2020-11-28 LAB — GUARDANT 360

## 2020-11-28 MED ORDER — DOCUSATE SODIUM 100 MG PO CAPS: 100.0000 mg | ORAL_CAPSULE | Freq: Two times a day (BID) | ORAL | 0 refills | Status: DC

## 2020-11-28 MED ORDER — DEXAMETHASONE 4 MG PO TABS: 4.0000 mg | ORAL_TABLET | Freq: Three times a day (TID) | ORAL | 0 refills | Status: DC

## 2020-11-28 MED ORDER — PANTOPRAZOLE SODIUM 40 MG PO TBEC: 40.0000 mg | DELAYED_RELEASE_TABLET | Freq: Every day | ORAL | 0 refills | Status: AC

## 2020-11-28 MED ORDER — CARVEDILOL 12.5 MG PO TABS: 12.5000 mg | ORAL_TABLET | Freq: Two times a day (BID) | ORAL | 1 refills | Status: AC

## 2020-11-28 NOTE — Progress Notes (Signed)
DISCHARGE NOTE HOME Cassandra Dyer to be discharged Home per MD order. Discussed prescriptions and follow up appointments with the patient. Prescriptions given to patient; medication list explained in detail. Patient verbalized understanding.  Skin clean, dry and intact without evidence of skin break down, no evidence of skin tears noted. IV catheter discontinued intact. Site without signs and symptoms of complications. Dressing and pressure applied. Pt denies pain at the site currently. No complaints noted.  Patient free of lines, drains, and wounds.   An After Visit Summary (AVS) was printed and given to the patient. Patient escorted via wheelchair, and discharged home via private auto.  Arlyss Repress, RN

## 2020-11-28 NOTE — Discharge Summary (Signed)
Physician Discharge Summary  Cassandra Dyer ZOX:[id number] DOB: July 27, 1941 DOA: 11/20/2020  PCP: Biagio Borg, MD  Admit date: 11/20/2020 Discharge date: 11/28/2020  Admitted From: Home Disposition: Home   Recommendations for Outpatient Follow-up:  1. Follow up with PCP in 1-2 weeks 2. Please obtain BMP/CBC in one week 3. Follow up with Ortho post hip replacement.  4. Follow up with radiation oncology for radiation of metastasis diseases.  5. Follow phosphorus level.   Home Health: HH, PT   Discharge Condition: Stable.  CODE STATUS: DNR Diet recommendation: Heart Healthy  Brief/Interim Summary: 79 year old with past medical history significant for hypertension, hyperlipidemia, recent diagnosis of lung cancer, remote history of renal cell carcinoma, COPD on 2 L of oxygen, ESRD on hemodialysis presented to hospital with generalized weakness, she missed hemodialysis.  She also complaining of right hip pain and ambulatory dysfunction.  She was not able to go to hemodialysis because of pain.  She also complained of generalized weakness, poor appetite and weight loss.    Patient was found to have left hilar mass and bronchoscopy with endobronchial ultrasound was performed.  She was subsequently diagnosed with a stage IV NSCLC.  MRI of the brain and PET scan were ordered.  Patient was seen by radiation oncology on 12/2 with the plan for palliative radiation.  She was seen again by oncology PA on 12/6 she was complaining of shortness of breath and right hip pain.  Chest x-ray showed a stable pleural effusion and right hip film with minimal displaced right femoral neck fracture concerning for pathologic fracture.  She was told to stay off of her hip and has been in wheelchair since then.  In the ED patient was noted to be mildly hyperkalemic.  Nephrology was consulted for hemodialysis.  She had missed hemodialysis on Monday and Wednesday.  Patient was then admitted to the hospital for further  evaluation and treatment.  Orthopedics was also consulted for surgical intervention.  Patient underwent right hip hemiarthroplasty on 11/22/2020.  Seen by PT postoperative and recommended home health.  Patient did have cerebral edema with metastatic lesions so radiation oncology was consulted.  Radiation oncology recommended MRI with gadolinium followed by hemodialysis. We will get her second hemodialysis treatment today after MRI with gadolinium was done yesterday. Plan is to discharge today  and she will follow-up for radiation oncology for simulation today.    1-Acute Right Hip pain with pathological femoral neck fracture: MRI of the hip showed impacted subcapital right femoral neck pathological fracture. Underwent radical resection of the right femoral head and neck lesion with right hip hemiarthroplasty on 11/22/2020 by orthopedic PT recommended home health PT. This has been arrange.   2-ESRD on hemodialysis: She has missed hemodialysis prior to admission. Patient was Dialyzed for   two days consecutive inpatient after MRI with gadolinium.  resume out patient HD>   3-Hyperkalemia corrected with hemodialysis.  4-Right upper extremity twitching: Improved  5-Stage IV lung cancer recent diagnosis with cerebral metastasis and vasogenic edema: MRI of the brain without contrast with 6 metastatic lesion. Started Decadron 4 mg 3 times daily. MRI with gadolinium showed 8 metastatic lesions. Plan to discharge today , she will follow-up radiation oncology for  Simulation today   6-COPD with chronic hypoxic respiratory failure: Continue with bronchodilators On 2 L of oxygen chronically at home  Leukocytosis: Trending down. On steroids.   Anemia of chronic kidney disease continue to monitor hemoglobin Hypokalemia: Corrected with hemodialysis. Hypophosphatemia: replaced. Repeat level out patient with  HD  Discharge Diagnoses:  Principal Problem:   Closed displaced fracture of  right femoral neck (HCC) Active Problems:   Essential hypertension   Dyslipidemia   Hyperkalemia, diminished renal excretion   COPD (chronic obstructive pulmonary disease) (HCC)   Adenocarcinoma of left lung, stage 4 (HCC)   Noncompliance of patient with renal dialysis [facility])    Discharge Instructions  Discharge Instructions    Diet - low sodium heart healthy   Complete by: As directed    Discharge wound care:   Complete by: As directed    See instruction AVS   Increase activity slowly   Complete by: As directed    Weight bearing as tolerated   Complete by: As directed      Allergies as of 11/28/2020   No Known Allergies     Medication List    STOP taking these medications   amoxicillin-clavulanate 875-125 MG tablet Commonly known as: Augmentin   metoprolol succinate 100 MG 24 hr tablet Commonly known as: TOPROL-XL     TAKE these medications   amLODipine 10 MG tablet Commonly known as: NORVASC Take 1 tablet (10 mg total) by mouth daily.   ascorbic acid 500 MG tablet Commonly known as: VITAMIN C Take 500 mg by mouth 2 (two) times daily.   Black Currant Seed Oil 500 MG Caps Take 500 mg by mouth every morning.   CALCIUM 1200 PO Take 1,200 mg by mouth every morning.   carvedilol 12.5 MG tablet Commonly known as: COREG Take 1 tablet (12.5 mg total) by mouth 2 (two) times daily with a meal.   dexamethasone 4 MG tablet Commonly known as: DECADRON Take 1 tablet (4 mg total) by mouth every 8 (eight) hours.   docusate sodium 100 MG capsule Commonly known as: COLACE Take 1 capsule (100 mg total) by mouth 2 (two) times daily.   enoxaparin 30 MG/0.3ML injection Commonly known as: LOVENOX Inject 0.3 mLs (30 mg total) into the skin daily for 14 days.   FISH OIL PO Take 1 capsule by mouth every morning.   Garlic 6283 MG Caps Take 2,000 mg by mouth daily.   multivitamin with minerals Tabs tablet Take 1 tablet by mouth every morning.   ondansetron 4 MG  tablet Commonly known as: Zofran Take 1 tablet (4 mg total) by mouth every 8 (eight) hours as needed for nausea or vomiting.   oxyCODONE-acetaminophen 5-325 MG tablet Commonly known as: Percocet Take 1-2 tablets by mouth every 8 (eight) hours as needed for severe pain.   pantoprazole 40 MG tablet Commonly known as: PROTONIX Take 1 tablet (40 mg total) by mouth daily.   polyethylene glycol 17 g packet Commonly known as: MIRALAX / GLYCOLAX Take 17 g by mouth daily as needed for mild constipation.   rosuvastatin 10 MG tablet Commonly known as: CRESTOR Take 1 tablet (10 mg total) by mouth daily.   senna-docusate 8.6-50 MG tablet Commonly known as: Senokot-S Take 1 tablet by mouth daily as needed for mild constipation.   telmisartan 80 MG tablet Commonly known as: MICARDIS Take 80 mg by mouth daily.   Vitamin D3 50 MCG (2000 UT) capsule Take 2,000 Units by mouth every morning.            Discharge Care Instructions  (From admission, onward)         Start     Ordered   11/28/20 0000  Discharge wound care:       Comments: See instruction AVS  11/28/20 0753   11/22/20 0000  Weight bearing as tolerated        11/22/20 1511          Follow-up Information    Leandrew Koyanagi, MD In 2 weeks.   Specialty: Orthopedic Surgery Why: For suture removal, For wound re-check Contact information: [place] [address] [phone]        [facility] Follow up.   Why: the office will call to schedule home health visits Contact information: [address] [place], [place] [address] [phone]             No Known Allergies  Consultations:  Nephrology   radiation oncology    Procedures/Studies: DG Chest 1 View  Result Date: 10/31/2020 CLINICAL DATA:  Status post thoracentesis EXAM: CHEST  1 VIEW COMPARISON:  July 30, 2020. FINDINGS: Left pleural effusion smaller following thoracentesis. Small pleural effusion on each  side. No pneumothorax. There is slight bibasilar atelectasis. Heart size and pulmonary vascular normal. No adenopathy. Central catheter tip is at the cavoatrial junction. There is aortic atherosclerosis. No bone lesions. IMPRESSION: Small pleural effusion currently on each side with mild bibasilar atelectasis. No pneumothorax. No consolidation. Stable cardiac silhouette. Aortic Atherosclerosis (ICD10-I70.0). Electronically Signed   By: Lowella Grip III M.D.   On: 10/31/2020 12:21   DG Chest 2 View  Result Date: 11/20/2020 CLINICAL DATA:  cp EXAM: CHEST - 2 VIEW COMPARISON:  11/18/2020 FINDINGS: Tunneled right IJ CVC tip overlies the superior cavoatrial junction. Cardiomegaly with central pulmonary vascular congestion. Redemonstration of small bilateral pleural effusions. New patchy bilateral pulmonary opacities. Multilevel spondylosis. IMPRESSION: 1. Cardiomegaly with central pulmonary vascular congestion. Redemonstration of small bilateral pleural effusions. 2. New confluent/patchy bilateral pulmonary opacities. Differential includes edema versus infection. Electronically Signed   By: Primitivo Gauze M.D.   On: 11/20/2020 13:29   CT ANGIO CHEST PE W OR WO CONTRAST  Result Date: 11/02/2020 CLINICAL DATA:  Respiratory failure EXAM: CT ANGIOGRAPHY CHEST WITH CONTRAST TECHNIQUE: Multidetector CT imaging of the chest was performed using the standard protocol during bolus administration of intravenous contrast. Multiplanar CT image reconstructions and MIPs were obtained to evaluate the vascular anatomy. CONTRAST:  61mL OMNIPAQUE IOHEXOL 350 MG/ML SOLN COMPARISON:  CT abdomen pelvis, 04/08/2020, 08/05/2017 FINDINGS: Cardiovascular: Satisfactory opacification of the pulmonary arteries. No evidence of pulmonary embolism. The left lower lobar pulmonary arteries are nearly occluded by a left hilar mass. Normal heart size. Left coronary artery calcifications. No pericardial effusion. Aortic atherosclerosis.  Mediastinum/Nodes: Numerous enlarged left hilar and mediastinal lymph nodes, with a left hilar mass. Largest discretely measurable lymph nodes measure up to 2.1 x 1.4 cm (series 8, image 56). Thyroid gland, trachea, and esophagus demonstrate no significant findings. Lungs/Pleura: Mild centrilobular emphysema. Small to moderate, left greater than right pleural effusions and associated atelectasis or consolidation. There is a left hilar mass measuring approximately 5.2 x 4.0 cm, which obstructs the left lower lobar bronchus near the origin (series 9, image 208). There is a subpleural nodule of the posterior left apex measuring 1.4 x 1.3 cm (series 8, image 31) and an additional subpleural nodule of the high medial apex measuring 0.9 x 0.6 cm (series 8, image 25). Upper Abdomen: Partially imaged left adrenal nodule, not obviously changed compared to prior examination dated 04/08/2020 measuring 1.5 x 1.0 cm (series 9, image 39). Musculoskeletal: No chest wall abnormality. No acute or significant osseous findings. Review of the MIP images confirms the above findings. IMPRESSION: 1.  Negative examination for pulmonary embolism. 2. There is a left hilar mass measuring approximately 5.2 x 4.0 cm, which obstructs the left lower lobar bronchus near the origin as well as the left lower lobar pulmonary arteries. 3. Subpleural nodules of the left pulmonary apex. 4. Numerous enlarged left hilar and mediastinal lymph nodes. 5. Constellation of findings is consistent primary lung malignancy and nodal and left-sided pulmonary metastatic disease. 6. Small to moderate, left greater than right pleural effusions and associated atelectasis or consolidation. Left pleural effusion is presumed malignant given presence of subpleural pulmonary nodules, there is no direct evidence of right sided malignant effusion or metastatic disease. 7. Partially imaged left adrenal nodule, not obviously changed compared to prior examinations and previously  better characterized as a benign adenoma. Aortic Atherosclerosis (ICD10-I70.0) and Emphysema (ICD10-J43.9). Electronically Signed   By: Eddie Candle M.D.   On: 11/02/2020 19:11   MR BRAIN WO CONTRAST  Result Date: 11/24/2020 CLINICAL DATA:  79 year old female with cerebral metastatic disease. Postoperative day 2 ORIF for pathologic right femoral fracture. EXAM: MRI HEAD WITHOUT CONTRAST TECHNIQUE: Multiplanar, multiecho pulse sequences of the brain and surrounding structures were obtained without intravenous contrast. COMPARISON:  Brain MRI without contrast 01/23/2020. FINDINGS: Brain: Confluent vasogenic edema in the posterior left frontal and superior left parietal lobes surrounding a lobulated 14 mm presumed isointense cerebral metastasis is stable from 11/21/2020. No significant regional mass effect. Other predominantly T2 dark, FLAIR heterogeneous brain masses appear stable, some with associated hemosiderin as before (medial left occipital lobe). And additional lesions are possible given the absence of IV contrast on both of these exams. However, one lesion of the anterior left frontal lobe has substantially regressed in the short-term based on T2 and FLAIR imaging (series 6, image 21) with resolved adjacent edema. No new areas of vasogenic edema. No significant intracranial mass effect. No superimposed restricted diffusion to suggest acute infarction. No ventriculomegaly, extra-axial collection or acute intracranial hemorrhage. Cervicomedullary junction and pituitary are within normal limits. Vascular: Major intracranial vascular flow voids are stable. Dominant right vertebral artery again suspected. Skull and upper cervical spine: Visualized bone marrow signal is within normal limits. Grossly normal visible cervical spine. Sinuses/Orbits: Stable, negative. Other: Mastoids remain clear. Grossly normal visible internal auditory structures. IMPRESSION: 1. Stable metastatic disease related cerebral edema  since the noncontrast scan on 11/21/2020. No new or progressive lesions are evident in the absence of contrast. Postcontrast imaging likely would identify additional small metastases. 2. Regression of a single small anterior left frontal lobe lesion since 11/21/2020 with largely resolved edema there. 3. No new intracranial abnormality. Electronically Signed   By: Genevie Ann M.D.   On: 11/24/2020 15:53   MR BRAIN WO CONTRAST  Result Date: 11/21/2020 CLINICAL DATA:  Metastatic lung cancer, history of renal cell carcinoma EXAM: MRI HEAD WITHOUT CONTRAST TECHNIQUE: Multiplanar, multiecho pulse sequences of the brain and surrounding structures were obtained without intravenous contrast. COMPARISON:  None. FINDINGS: Brain: Suboptimal evaluation for metastatic disease without intravenous contrast. Several lesions are identified and annotated primarily on axial T2 series 6: 1.4 x 0.8 cm lesion of the left postcentral gyrus with moderate surrounding edema on series 6, image 20; 0.5 cm lesion of the left superior frontal gyrus on series 6, image 22; 0.7 cm lesion of the left superior frontal gyrus more inferiorly with minimal edema on series 6, image 18; 2.6 x 1.7 cm lesion of the right lateral ventricle along the septum pellucidum on series 6, image 15; 0.5 cm lesion of  the parasagittal left occipital lobe on series 6, image 13 with corresponding susceptibility reflecting chronic blood products or mineralization; 1 cm lesion of lateral left cerebellum with mild surrounding edema on series 7, image 6; Question of a small area of edema versus gliosis involving the lateral left precentral gyrus on series 7, image 16 without definite lesion. There is no acute infarction or intracranial hemorrhage. There is no hydrocephalus or extra-axial fluid collection. Prominence of the ventricles and sulci reflects minor generalized parenchymal volume loss. Vascular: Major vessel flow voids at the skull base are preserved. Skull and upper  cervical spine: Normal marrow signal is preserved. Sinuses/Orbits: Trace mucosal thickening. Bilateral lens replacements. Other: Sella is unremarkable.  Mastoid air cells are clear. IMPRESSION: Six intracranial lesions are identified likely reflecting metastases. There is edema associated with the larger lesions but no significant mass effect. Question of a small area of edema versus gliosis in the posterior left frontal lobe without definite underlying lesion. Electronically Signed   By: Macy Mis M.D.   On: 11/21/2020 13:04   MR BRAIN W WO CONTRAST  Result Date: 11/26/2020 CLINICAL DATA:  Metastatic lung cancer, abnormal noncontrast brain MRI EXAM: MRI HEAD WITHOUT AND WITH CONTRAST TECHNIQUE: Multiplanar, multiecho pulse sequences of the brain and surrounding structures were obtained without and with intravenous contrast. CONTRAST:  44mL GADAVIST GADOBUTROL 1 MMOL/ML IV SOLN COMPARISON:  Multiple recent noncontrast studies FINDINGS: Brain: Multiple lesions are again identified and are annotated on series 1100. There has been no change in size since recent prior imaging. A total of 8 lesions are identified on images 111, 146, 160, 177, 205, 217, 224, and 229. In comparison to the noncontrast imaging, this reflects identification of an additional left frontal and a right thalamic metastasis. The left occipital lesion demonstrates intrinsic T1 shortening and corresponding susceptibility and could conceivably reflect a cavernous malformation but given associated edema, metastasis is more likely. Stable associated edema. No significant mass effect. Incidental note is made of a left frontal developmental venous anomaly with adjacent gliosis. No acute infarction hemorrhage. Vascular: Major vessel flow voids at the skull base are preserved. Skull and upper cervical spine: No new findings. Sinuses/Orbits: No new findings. Other: Sella is unremarkable.  Mastoid air cells are clear. IMPRESSION: 8 metastatic lesions  with stable edema. Left frontal and right thalamic lesions identified in addition to those on prior noncontrast imaging. Electronically Signed   By: Macy Mis M.D.   On: 11/26/2020 13:29   DG Chest Bilateral Decubitus  Result Date: 11/18/2020 CLINICAL DATA:  79 year old female with history of pleural effusions and thoracentesis, chest CT last month revealing left hilar mass. Shortness of breath. New right hip pain. EXAM: CHEST - BILATERAL DECUBITUS VIEW COMPARISON:  Portable chest 11/05/2020. Chest CTA 11/02/2020, and earlier. FINDINGS: Bilateral lateral decubitus views of the chest are provided. Both sides demonstrate layering pleural fluid, fairly symmetric with fluid to about 1.5 cm thickness on both sides. This is similar to the CTA chest 11/02/2020. The right side non dependent costophrenic angle clears better than the left. Grossly stable mediastinal contours, and right chest dual lumen dialysis type catheter remains in place. IMPRESSION: Fairly symmetric bilateral layering pleural effusions, fluid volume likely not significantly changed from the CTA 11/02/2020. Electronically Signed   By: Genevie Ann M.D.   On: 11/18/2020 16:38   Pelvis Portable  Result Date: 11/22/2020 CLINICAL DATA:  79 year old female status post hip replacement following pathologic fracture secondary to metastatic cancer. EXAM: PORTABLE PELVIS 1-2 VIEWS COMPARISON:  Intraoperative images 1359 hours today. FINDINGS: Portable AP view at 1552 hours. Bipolar right hip arthroplasty. Hardware appears intact with normal AP alignment. No unexpected osseous changes. Regional postoperative soft tissue gas. Lytic lesion in the superior left femoral neck redemonstrated. Small volume retained oral contrast in the descending colon. Calcified uterine fibroid. IMPRESSION: 1. Bipolar right hip arthroplasty with no adverse features. 2. Proximal left femoral lytic metastasis. Electronically Signed   By: Genevie Ann M.D.   On: 11/22/2020 16:47   MR  FEMUR RIGHT W WO CONTRAST  Result Date: 11/20/2020 CLINICAL DATA:  Fracture with metastatic disease EXAM: MRI OF THE RIGHT FEMUR WITHOUT AND WITH CONTRAST TECHNIQUE: Multiplanar, multisequence MR imaging of the right was performed both before and after administration of intravenous contrast. CONTRAST:  59mL GADAVIST GADOBUTROL 1 MMOL/ML IV SOLN COMPARISON:  None. FINDINGS: Bones/Joint/Cartilage There is a nondisplaced slightly impacted subcapital right femoral neck fracture as on the recent exam. There is a heterogeneous T1 isointense/heterogeneously T2 bright peripherally enhancing lesion within the medial right femoral neck. Mild surrounding marrow edema seen. No other fracture is identified. There is moderate bilateral hip osteoarthritis. Within the left femoral head/neck there is also a T1 hypointense/T2 heterogeneous lesion. A moderate right hip joint effusion is seen. Ligaments The ligamentum teres appears to be grossly intact Muscles and Tendons There is edema seen within the adductor musculature and iliopsoas musculature. The tendons appear to be grossly intact. Soft tissues Soft tissue swelling seen along the lateral aspect of the hip. Multiple hypointense uterine fibroids are seen. There is cystic lesion seen within the left adnexa which is partially visualized. IMPRESSION: Nondisplaced slightly impacted subcapital right femoral neck pathologic fracture. There is a osseous lesion within the right femoral neck, likely metastatic lesion . Would recommend nuclear medicine bone scan for further evaluation other osseous lesions. Osseous lesion within the left femoral head/neck concerning for metastatic lesion. Moderate right hip osteoarthritis and joint effusion Electronically Signed   By: Prudencio Pair M.D.   On: 11/20/2020 20:18   DG Chest Port 1 View  Result Date: 11/05/2020 CLINICAL DATA:  Patient status post left bronchoscopy today. EXAM: PORTABLE CHEST 1 VIEW COMPARISON:  Single-view of the chest  11/01/2019. FINDINGS: Negative for pneumothorax after bronchoscopy. Small bilateral pleural effusions and basilar atelectasis are worse on the left. Heart size is normal. Aortic atherosclerosis. Dialysis catheter is unchanged. IMPRESSION: Negative for pneumothorax after bronchoscopy. Small bilateral pleural effusions and basilar airspace disease, worse on the left. Electronically Signed   By: Inge Rise M.D.   On: 11/05/2020 12:17   DG CHEST PORT 1 VIEW  Result Date: 10/30/2020 CLINICAL DATA:  Shortness of breath EXAM: PORTABLE CHEST 1 VIEW COMPARISON:  October 28, 2020 FINDINGS: Central catheter tip is at the cavoatrial junction. No pneumothorax. There are pleural effusions bilaterally with bibasilar atelectasis. Heart is mildly enlarged with pulmonary vascularity normal. No adenopathy. There is aortic atherosclerosis. No bone lesions. IMPRESSION: Central catheter as described without pneumothorax. Pleural effusions bilaterally with bibasilar atelectasis. It should be noted that superimposed pneumonia in the bases cannot be excluded by radiography. No new opacity appreciable. Stable cardiac silhouette. Aortic Atherosclerosis (ICD10-I70.0). Electronically Signed   By: Lowella Grip III M.D.   On: 10/30/2020 08:15   DG C-Arm 1-60 Min  Result Date: 11/22/2020 CLINICAL DATA:  Right hip hemiarthroplasty. EXAM: DG C-ARM 1-60 MIN; OPERATIVE RIGHT HIP WITH PELVIS FLUOROSCOPY TIME:  Fluoroscopy Time:  6.9 seconds COMPARISON:  November 18, 2020. FINDINGS: Four C-arm fluoroscopic images were obtained  intraoperatively and submitted for post operative interpretation. These images demonstrate surgical changes of right hip arthroplasty. Please see the performing provider's procedural report for further detail. IMPRESSION: Intraoperative fluoroscopic images, as detailed above. Electronically Signed   By: Margaretha Sheffield MD   On: 11/22/2020 15:17   DG Hip Unilat W or W/O Pelvis 1 View Right  Addendum Date:  11/18/2020   ADDENDUM REPORT: 11/18/2020 16:53 ADDENDUM: Study discussed by telephone with PA-C VAN TANNER on 11/18/2020 at 1645 hours. Electronically Signed   By: Genevie Ann M.D.   On: 11/18/2020 16:53   Result Date: 11/18/2020 CLINICAL DATA:  79 year old female with history of pleural effusions and thoracentesis, chest CT last month revealing left hilar mass. Shortness of breath. New right hip pain. EXAM: DG HIP (WITH OR WITHOUT PELVIS) 1V RIGHT COMPARISON:  CT Abdomen and Pelvis 04/08/2020. FINDINGS: There is a minimally displaced right femoral neck fracture. The fracture appears to extend from the superior mid neck toward the inferior subcapital neck region. The right femoral head remains normally located. The right femur intertrochanteric segment and visible proximal femoral shaft appear intact. Bone mineralization about the pelvis and proximal femurs does not appear significantly changed from the April CT. No destructive osseous lesion identified radiographically. Pelvis and proximal left femur appear intact. Chronic calcified uterine fibroid again noted. Negative visible other lower abdominal and pelvic visceral contours. IMPRESSION: 1. Minimally displaced right femoral neck fracture, suspicious for pathologic fracture in this setting. 2. MRI versus whole-body bone scan would probably be most sensitive for detection of osseous metastatic disease. Electronically Signed: By: Genevie Ann M.D. On: 11/18/2020 16:41   DG HIP OPERATIVE UNILAT WITH PELVIS RIGHT  Result Date: 11/22/2020 CLINICAL DATA:  Right hip hemiarthroplasty. EXAM: DG C-ARM 1-60 MIN; OPERATIVE RIGHT HIP WITH PELVIS FLUOROSCOPY TIME:  Fluoroscopy Time:  6.9 seconds COMPARISON:  November 18, 2020. FINDINGS: Four C-arm fluoroscopic images were obtained intraoperatively and submitted for post operative interpretation. These images demonstrate surgical changes of right hip arthroplasty. Please see the performing provider's procedural report for further  detail. IMPRESSION: Intraoperative fluoroscopic images, as detailed above. Electronically Signed   By: Margaretha Sheffield MD   On: 11/22/2020 15:17   IR THORACENTESIS ASP PLEURAL SPACE W/IMG GUIDE  Result Date: 10/31/2020 INDICATION: Patient with end-stage renal disease, bilateral pleural effusions. Request is made for therapeutic thoracentesis. EXAM: ULTRASOUND GUIDED THERAPEUTIC RIGHT THORACENTESIS MEDICATIONS: 10 mL 1% lidocaine COMPLICATIONS: None immediate. PROCEDURE: An ultrasound guided thoracentesis was thoroughly discussed with the patient and questions answered. The benefits, risks, alternatives and complications were also discussed. The patient understands and wishes to proceed with the procedure. Written consent was obtained. Ultrasound was performed to localize and mark an adequate pocket of fluid in the right chest. The area was then prepped and draped in the normal sterile fashion. 1% Lidocaine was used for local anesthesia. Under ultrasound guidance a 6 Fr Safe-T-Centesis catheter was introduced. Thoracentesis was performed. The catheter was removed and a dressing applied. FINDINGS: A total of approximately 650 mL of amber fluid was removed. Samples were sent to the laboratory as requested by the clinical team. IMPRESSION: Successful ultrasound guided therapeutic right thoracentesis yielding 650 mL of pleural fluid. Read by: Brynda Greathouse PA-C Electronically Signed   By: Jerilynn Mages.  Shick M.D.   On: 10/31/2020 16:13   DG C-ARM BRONCHOSCOPY  Result Date: 11/05/2020 C-ARM BRONCHOSCOPY: Fluoroscopy was utilized by the requesting physician.  No radiographic interpretation.     Subjective: She is alert, feeling well, denies pain  Discharge Exam: Vitals:   11/28/20 0518 11/28/20 0804  BP: (!) 132/59 132/63  Pulse: 79   Resp: 14   Temp: 98.5 F (36.9 C)   SpO2: 100% 100%     General: Pt is alert, awake, not in acute distress Cardiovascular: RRR, S1/S2 +, no rubs, no  gallops Respiratory: CTA bilaterally, no wheezing, no rhonchi Abdominal: Soft, NT, ND, bowel sounds + Extremities: no edema, no cyanosis    The results of significant diagnostics from this hospitalization (including imaging, microbiology, ancillary and laboratory) are listed below for reference.     Microbiology: Recent Results (from the past 240 hour(s))  Resp Panel by RT-PCR (Flu A&B, Covid) Nasopharyngeal Swab     Status: None   Collection Time: 11/20/20  3:59 PM   Specimen: Nasopharyngeal Swab; Nasopharyngeal(NP) swabs in vial transport medium  Result Value Ref Range Status   SARS Coronavirus 2 by RT PCR NEGATIVE NEGATIVE Final    Comment: (NOTE) SARS-CoV-2 target nucleic acids are NOT DETECTED.  The SARS-CoV-2 RNA is generally detectable in upper respiratory specimens during the acute phase of infection. The lowest concentration of SARS-CoV-2 viral copies this assay can detect is 138 copies/mL. A negative result does not preclude SARS-Cov-2 infection and should not be used as the sole basis for treatment or other patient management decisions. A negative result may occur with  improper specimen collection/handling, submission of specimen other than nasopharyngeal swab, presence of viral mutation(s) within the areas targeted by this assay, and inadequate number of viral copies(<138 copies/mL). A negative result must be combined with clinical observations, patient history, and epidemiological information. The expected result is Negative.  Fact Sheet for Patients:  EntrepreneurPulse.com.au  Fact Sheet for Healthcare Providers:  IncredibleEmployment.be  This test is no t yet approved or cleared by the [place] FDA and  has been authorized for detection and/or diagnosis of SARS-CoV-2 by FDA under an Emergency Use Authorization (EUA). This EUA will remain  in effect (meaning this test can be used) for the duration of the COVID-19  declaration under Section 564(b)(1) of the Act, 21 U.S.C.section 360bbb-3(b)(1), unless the authorization is terminated  or revoked sooner.       Influenza A by PCR NEGATIVE NEGATIVE Final   Influenza B by PCR NEGATIVE NEGATIVE Final    Comment: (NOTE) The Xpert Xpress SARS-CoV-2/FLU/RSV plus assay is intended as an aid in the diagnosis of influenza from Nasopharyngeal swab specimens and should not be used as a sole basis for treatment. Nasal washings and aspirates are unacceptable for Xpert Xpress SARS-CoV-2/FLU/RSV testing.  Fact Sheet for Patients: EntrepreneurPulse.com.au  Fact Sheet for Healthcare Providers: IncredibleEmployment.be  This test is not yet approved or cleared by the [place] FDA and has been authorized for detection and/or diagnosis of SARS-CoV-2 by FDA under an Emergency Use Authorization (EUA). This EUA will remain in effect (meaning this test can be used) for the duration of the COVID-19 declaration under Section 564(b)(1) of the Act, 21 U.S.C. section 360bbb-3(b)(1), unless the authorization is terminated or revoked.  Performed at [facility], [place] [address] [place], [place] [address]   Surgical pcr screen     Status: None   Collection Time: 11/21/20  8:22 PM   Specimen: Nasal Mucosa; Nasal Swab  Result Value Ref Range Status   MRSA, PCR NEGATIVE NEGATIVE Final   Staphylococcus aureus NEGATIVE NEGATIVE Final    Comment: (NOTE) The Xpert SA Assay (FDA approved for NASAL specimens in patients 73 years of age and  older), is one component of a comprehensive surveillance program. It is not intended to diagnose infection nor to guide or monitor treatment. Performed at [facility], [place] [address] [place], [place] [address]      Labs: BNP (last 3 results) No results for input(s): BNP in the last 8760 hours. Basic Metabolic Panel: Recent Labs  Lab 11/22/20 0249 11/23/20 0502 11/24/20 0400  11/25/20 1607 11/26/20 0139 11/27/20 0203  NA 134* 133* 133*  --  130* 134*  K 4.3 6.1* 3.9 3.8 4.0 3.2*  CL 96* 95* 95*  --  90* 95*  CO2 26 24 26   --  25 27  GLUCOSE 107* 120* 133*  --  181* 167*  BUN 5* 20 14  --  71* 46*  CREATININE 1.82* 3.91* 2.33*  --  5.86* 3.86*  CALCIUM 7.7* 8.8* 8.2*  --  8.7* 7.9*  MG 1.8 2.0  --   --  1.9 1.8  PHOS  --   --   --   --   --  1.4*   Liver Function Tests: Recent Labs  Lab 11/22/20 0249 11/27/20 0203  AST 29 47*  ALT 12 13  ALKPHOS 130* 171*  BILITOT 0.7 0.6  PROT 5.0* 4.7*  ALBUMIN 1.6* 1.5*   No results for input(s): LIPASE, AMYLASE in the last 168 hours. No results for input(s): AMMONIA in the last 168 hours. CBC: Recent Labs  Lab 11/23/20 0502 11/24/20 0400 11/25/20 0344 11/26/20 0139 11/27/20 0203  WBC 25.6* 24.4* 27.5* 31.8* 19.8*  HGB 8.7* 8.4* 8.5* 7.7* 7.1*  HCT 28.0* 25.7* 26.1* 23.5* 22.0*  MCV 88.6 88.3 85.9 86.1 88.4  PLT 324 363 353 373 339   Cardiac Enzymes: No results for input(s): CKTOTAL, CKMB, CKMBINDEX, TROPONINI in the last 168 hours. BNP: Invalid input(s): POCBNP CBG: Recent Labs  Lab 11/22/20 1519  GLUCAP 101*   D-Dimer No results for input(s): DDIMER in the last 72 hours. Hgb A1c No results for input(s): HGBA1C in the last 72 hours. Lipid Profile No results for input(s): CHOL, HDL, LDLCALC, TRIG, CHOLHDL, LDLDIRECT in the last 72 hours. Thyroid function studies No results for input(s): TSH, T4TOTAL, T3FREE, THYROIDAB in the last 72 hours.  Invalid input(s): FREET3 Anemia work up No results for input(s): VITAMINB12, FOLATE, FERRITIN, TIBC, IRON, RETICCTPCT in the last 72 hours. Urinalysis    Component Value Date/Time   COLORURINE YELLOW 07/28/2020 1131   APPEARANCEUR HAZY (A) 07/28/2020 1131   LABSPEC 1.016 07/28/2020 1131   PHURINE 5.0 07/28/2020 1131   GLUCOSEU NEGATIVE 07/28/2020 1131   GLUCOSEU NEGATIVE 04/26/2020 0909   HGBUR NEGATIVE 07/28/2020 1131   [place]  07/28/2020 1131   KETONESUR NEGATIVE 07/28/2020 1131   PROTEINUR NEGATIVE 07/28/2020 1131   UROBILINOGEN 0.2 04/26/2020 0909   NITRITE NEGATIVE 07/28/2020 1131   LEUKOCYTESUR LARGE (A) 07/28/2020 1131   Sepsis Labs Invalid input(s): PROCALCITONIN,  WBC,  LACTICIDVEN Microbiology Recent Results (from the past 240 hour(s))  Resp Panel by RT-PCR (Flu A&B, Covid) Nasopharyngeal Swab     Status: None   Collection Time: 11/20/20  3:59 PM   Specimen: Nasopharyngeal Swab; Nasopharyngeal(NP) swabs in vial transport medium  Result Value Ref Range Status   SARS Coronavirus 2 by RT PCR NEGATIVE NEGATIVE Final    Comment: (NOTE) SARS-CoV-2 target nucleic acids are NOT DETECTED.  The SARS-CoV-2 RNA is generally detectable in upper respiratory specimens during the acute phase of infection. The lowest concentration of SARS-CoV-2 viral copies this  assay can detect is 138 copies/mL. A negative result does not preclude SARS-Cov-2 infection and should not be used as the sole basis for treatment or other patient management decisions. A negative result may occur with  improper specimen collection/handling, submission of specimen other than nasopharyngeal swab, presence of viral mutation(s) within the areas targeted by this assay, and inadequate number of viral copies(<138 copies/mL). A negative result must be combined with clinical observations, patient history, and epidemiological information. The expected result is Negative.  Fact Sheet for Patients:  EntrepreneurPulse.com.au  Fact Sheet for Healthcare Providers:  IncredibleEmployment.be  This test is no t yet approved or cleared by the [place] FDA and  has been authorized for detection and/or diagnosis of SARS-CoV-2 by FDA under an Emergency Use Authorization (EUA). This EUA will remain  in effect (meaning this test can be used) for the duration of the COVID-19 declaration under Section 564(b)(1) of the  Act, 21 U.S.C.section 360bbb-3(b)(1), unless the authorization is terminated  or revoked sooner.       Influenza A by PCR NEGATIVE NEGATIVE Final   Influenza B by PCR NEGATIVE NEGATIVE Final    Comment: (NOTE) The Xpert Xpress SARS-CoV-2/FLU/RSV plus assay is intended as an aid in the diagnosis of influenza from Nasopharyngeal swab specimens and should not be used as a sole basis for treatment. Nasal washings and aspirates are unacceptable for Xpert Xpress SARS-CoV-2/FLU/RSV testing.  Fact Sheet for Patients: EntrepreneurPulse.com.au  Fact Sheet for Healthcare Providers: IncredibleEmployment.be  This test is not yet approved or cleared by the [place] FDA and has been authorized for detection and/or diagnosis of SARS-CoV-2 by FDA under an Emergency Use Authorization (EUA). This EUA will remain in effect (meaning this test can be used) for the duration of the COVID-19 declaration under Section 564(b)(1) of the Act, 21 U.S.C. section 360bbb-3(b)(1), unless the authorization is terminated or revoked.  Performed at [facility], [place] [address], [place], [place] [address]   Surgical pcr screen     Status: None   Collection Time: 11/21/20  8:22 PM   Specimen: Nasal Mucosa; Nasal Swab  Result Value Ref Range Status   MRSA, PCR NEGATIVE NEGATIVE Final   Staphylococcus aureus NEGATIVE NEGATIVE Final    Comment: (NOTE) The Xpert SA Assay (FDA approved for NASAL specimens in patients 59 years of age and older), is one component of a comprehensive surveillance program. It is not intended to diagnose infection nor to guide or monitor treatment. Performed at [facility], [place] [address] [place], [place] [address]      Time coordinating discharge: 40 minutes  SIGNED:   Elmarie Shiley, MD  [facility]

## 2020-11-28 NOTE — Progress Notes (Signed)
Occupational Therapy Treatment °Patient Details °Name: Cassandra Dyer °MRN: [id number] °DOB: 11/06/1941 °Today's Date: 11/28/2020 ° ° ° °History of present illness 79 y.o. female admitted with weakness, and missed HD session due to Rt hip pain.  CXR  on 12/6 showed minimally displaced Rt femoral neck fracture concerning for pathological fx, and was waiting on appointment with orthopedist.  MRI showed pathological fractures bil. femoral necks. SHe underwent THA, direct anterior approach Rt hip.  PMH includes: HTN, HLD, recent dx Lung CA, remote h/o renal cell CA, COPD on 2L 02, ESRD on HD °  °OT comments ° Pt progressing towards established OT goals and plans for dc to home with sister this morning; has an appointment at the cancer center today. Pt performing grooming, peri care, dressing, and functional mobility at Supervision level with RW. Pt demonstrating increased activity tolerance. Update dc recommendation to home with HHOT and provided all education and answered questions in preparation for dc.  °  °Follow Up Recommendations ° Home health OT;Supervision/Assistance - 24 hour  °  °Equipment Recommendations ° Wheelchair (measurements OT);Wheelchair cushion (measurements OT);3 in 1 bedside commode  °  °Recommendations for Other Services   ° °  °Precautions / Restrictions Precautions °Precautions: Fall °Precaution Comments: watch O2 sats °Restrictions °Weight Bearing Restrictions: Yes °RLE Weight Bearing: Weight bearing as tolerated  ° ° °  ° °Mobility Bed Mobility °Overal bed mobility: Needs Assistance °Bed Mobility: Supine to Sit °  °  °Supine to sit: Supervision °  °  °General bed mobility comments: Increased time ° °Transfers °Overall transfer level: Needs assistance °Equipment used: Rolling walker (2 wheeled) °Transfers: Sit to/from Stand °Sit to Stand: Supervision °  °  °  °  °General transfer comment: Supervision for safety ° °  °Balance Overall balance assessment: Needs assistance °Sitting-balance support:  No upper extremity supported;Single extremity supported °Sitting balance-Leahy Scale: Fair °  °  °Standing balance support: Bilateral upper extremity supported;During functional activity;No upper extremity supported °Standing balance-Leahy Scale: Fair °  °  °  °  °  °  °  °  °  °  °  °  °   ° °ADL either performed or assessed with clinical judgement  ° °ADL Overall ADL's : Needs assistance/impaired °Eating/Feeding: Set up;Sitting °  °Grooming: Set up;Sitting;Wash/dry face °Grooming Details (indicate cue type and reason): Washing her face °  °  °  °  °Upper Body Dressing : Set up;Sitting °Upper Body Dressing Details (indicate cue type and reason): Donning dress °Lower Body Dressing: Supervision/safety;Sit to/from stand °Lower Body Dressing Details (indicate cue type and reason): Educating pt on donning RLE first. Pt using figure four method. Supervision level °Toilet Transfer: RW;Supervision/safety;Ambulation °Toilet Transfer Details (indicate cue type and reason): Close supervision for safety °Toileting- Clothing Manipulation and Hygiene: Supervision/safety;Sit to/from stand °Toileting - Clothing Manipulation Details (indicate cue type and reason): Supervision for safety. °  °  °Functional mobility during ADLs: Supervision/safety;Rolling walker °General ADL Comments: Pt performing grooming, peri care, and dressing at supervision level °  ° ° °Vision   °  °  °Perception   °  °Praxis   °  ° °Cognition Arousal/Alertness: Awake/alert °Behavior During Therapy: WFL for tasks assessed/performed °Overall Cognitive Status: Within Functional Limits for tasks assessed °  °  °  °  °  °  °  °  °  °  °  °  °  °  °  °  °General Comments: very motivated °  °  °   °  Exercises   °  °Shoulder Instructions   ° ° °  °General Comments VSS on 2L  ° ° °Pertinent Vitals/ Pain       Pain Assessment: Faces °Faces Pain Scale: Hurts a little bit °Pain Descriptors / Indicators: Grimacing;Discomfort;Sore °Pain Intervention(s): Monitored during  session;Limited activity within patient's tolerance;Repositioned ° °Home Living   °  °  °  °  °  °  °  °  °  °  °  °  °  °  °  °  °  °  ° °  °Prior Functioning/Environment    °  °  °  °   ° °Frequency ° Min 2X/week  ° ° ° ° °  °Progress Toward Goals ° °OT Goals(current goals can now be found in the care plan section) ° Progress towards OT goals: Progressing toward goals ° °Acute Rehab OT Goals °Patient Stated Goal: return to independence- do whatever rehab needed prior to returning home °OT Goal Formulation: With patient °Time For Goal Achievement: 12/08/20 °Potential to Achieve Goals: Good °ADL Goals °Pt Will Perform Grooming: with min guard assist;standing °Pt Will Perform Lower Body Bathing: with min assist;sit to/from stand;sitting/lateral leans;with adaptive equipment °Pt Will Perform Lower Body Dressing: with min assist;sitting/lateral leans;sit to/from stand;with adaptive equipment °Pt Will Transfer to Toilet: with min assist;ambulating;regular height toilet;grab bars °Pt Will Perform Toileting - Clothing Manipulation and hygiene: with modified independence;sit to/from stand °Pt Will Perform Tub/Shower Transfer: Tub transfer;with modified independence;shower seat;ambulating  °Plan Discharge plan needs to be updated;All goals met and education completed, patient discharged from OT services   ° °Co-evaluation ° ° °   °  °  °  °  ° °  °AM-PAC OT "6 Clicks" Daily Activity     °Outcome Measure ° ° Help from another person eating meals?: A Little °Help from another person taking care of personal grooming?: A Little °Help from another person toileting, which includes using toliet, bedpan, or urinal?: A Lot °Help from another person bathing (including washing, rinsing, drying)?: A Lot °Help from another person to put on and taking off regular upper body clothing?: A Little °Help from another person to put on and taking off regular lower body clothing?: A Lot °6 Click Score: 15 ° °  °End of Session Equipment Utilized  During Treatment: Oxygen;Rolling walker ° °OT Visit Diagnosis: Other abnormalities of gait and mobility (R26.89);Other (comment);Muscle weakness (generalized) (M62.81);Pain °Pain - Right/Left: Right °Pain - part of body: Leg;Hip °  °Activity Tolerance Patient tolerated treatment well °  °Patient Left in chair;with call bell/phone within reach °  °Nurse Communication Mobility status °  ° °   ° °Time: 0805-0826 °OT Time Calculation (min): 21 min ° °Charges: OT General Charges °$OT Visit: 1 Visit °OT Treatments °$Self Care/Home Management : 8-22 mins ° °Charis Capehart MSOT, OTR/L °Acute Rehab °Pager: [phone] °Office: [phone] ° ° °Charis M Capehart °11/28/2020, 8:32 AM ° ° ° °

## 2020-11-29 ENCOUNTER — Encounter: Payer: Self-pay | Admitting: *Deleted

## 2020-11-29 ENCOUNTER — Other Ambulatory Visit: Payer: Self-pay | Admitting: *Deleted

## 2020-11-29 DIAGNOSIS — D631 Anemia in chronic kidney disease: Secondary | ICD-10-CM | POA: Diagnosis not present

## 2020-11-29 DIAGNOSIS — Z992 Dependence on renal dialysis: Secondary | ICD-10-CM | POA: Diagnosis not present

## 2020-11-29 DIAGNOSIS — N186 End stage renal disease: Secondary | ICD-10-CM | POA: Diagnosis not present

## 2020-11-29 DIAGNOSIS — N2581 Secondary hyperparathyroidism of renal origin: Secondary | ICD-10-CM | POA: Diagnosis not present

## 2020-11-29 NOTE — Patient Outreach (Signed)
[facility]) Care Management [facility] CM Telephone Outreach PCP office completes Transition of Care follow up post-hospital discharge Post (most recent) hospital discharge day # 1 Unsuccessful initial outreach attempt  11/29/2020  Cassandra Dyer Feb 10, 1941 [id number]  Unsuccessful initial outreach attempt [name], 79 y/o female originally referred to [facility] RN CM 09/12/20 by [facility] Liaison after patient experienced2 recenthospitalizationsSeptember 27-30, 2021 for N/V/D with malaise and weakness;November 9-24, 2076for volume overload after patient missed established hemodialysis session. During most recent hospitalization, (L) hilar mass with pulmonary nodules concerning for malignancy discovered during this hospital visitand patient was diagnosed with lung cancer. She was discharged to home/ self-care with home health services recommended, patient refused.  Patient has history including, but not limited to, renal cell cancer with (R) nephrectomy 2018; ESRD- on hemodialysis since 07/2020; COPD- on home O2 at baseline at 2 L/min; HTN/ HLD.  Unfortunately, patient experienced third hospitalization December 8-16, 2021 for volume overload in setting of 2 missed outpatient hemodialysis sessions; patient could not attend due to pain in (R) hip; (R) femur fracture diagnosed and patient had surgical arthroplasty 11/22/20.  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  She was discharged home to self-care with home health services in place.   HIPAA compliant voice mail message left for patient, requesting return call back.  Plan:  Will mail another [facility] Unsuccessful outreach letter to patient, requesting call back  Will re-attempt [facility] CM telephone outreach within 4 business days if I do not hear back from patient/ caregiver first  [name], RN, BSN, [facility]  [phone]

## 2020-11-30 DIAGNOSIS — S72041A Displaced fracture of base of neck of right femur, initial encounter for closed fracture: Secondary | ICD-10-CM | POA: Diagnosis not present

## 2020-12-02 ENCOUNTER — Telehealth: Payer: Self-pay | Admitting: Orthopaedic Surgery

## 2020-12-02 DIAGNOSIS — Z992 Dependence on renal dialysis: Secondary | ICD-10-CM | POA: Diagnosis not present

## 2020-12-02 DIAGNOSIS — N2581 Secondary hyperparathyroidism of renal origin: Secondary | ICD-10-CM | POA: Diagnosis not present

## 2020-12-02 DIAGNOSIS — D631 Anemia in chronic kidney disease: Secondary | ICD-10-CM | POA: Diagnosis not present

## 2020-12-02 DIAGNOSIS — N186 End stage renal disease: Secondary | ICD-10-CM | POA: Diagnosis not present

## 2020-12-02 NOTE — Telephone Encounter (Signed)
[place] (phyical therapist) from [facility] called requesting verbal orders for home health pt for patient of once a week for 10 weeks. [place] phone number is [phone]. If she is unable to answer please leave detailed message on secure line.

## 2020-12-02 NOTE — Progress Notes (Signed)
[facility] OFFICE PROGRESS NOTE  Biagio Borg, MD [place] [id number]  DIAGNOSIS: stage IV (T3, N2, M1c) non-small cell lung cancer, adenocarcinoma presented with large left lower lobe lung mass in addition to left hilar and mediastinal lymphadenopathy as well as pleural-based metastasis in the left lung diagnosed in November 2021. She has metastatic disease to the brain. pending further staging work-up with PET scan.  DETECTED ALTERATION(S) / BIOMARKER(S) % CFDNA OR AMPLIFICATION ASSOCIATED FDA-APPROVED THERAPIES CLINICAL TRIAL AVAILABILITY [id number] 16.5% None  Yes [id number] 0.8% None  Yes [id number] 15.3% None  Yes  PDL1 Expression: 25%  PRIOR THERAPY: None  CURRENT THERAPY: 1) SRS to the metastatic brain lesions under the care of Dr. MLisbeth Renshaw Last treatment expected on 12/12/20 2) palliative systemic chemotherapy with carboplatin for an AUC of 5, paclitaxel 175 mg/m, Keytruda 200 mg IV every 3 weeks with Neulasta support.  First dose expected on 12/17/2020.  INTERVAL HISTORY: Cassandra MERTZ79y.o. female returns to the clinic today for a follow-up visit accompanied by her sister.  The patient was first seen in the clinic on 11/14/2020.  At that time, the patient had a brain MRI and PET scan ordered to complete the staging work-up.  Unfortunately, in the interval since her last appointment, the patient presented to the emergency room on 11/20/2020 for the chief complaint of weakness and malaise.  The patient had not gone to dialysis for several days and was found to have hyperkalemia.  The patient received emergent hemodialysis while in the hospital.  A brain MRI was also performed which showed a 8 metastatic brain lesions as well as cerebral edema.  The patient is following with radiation oncology.  She is undergoing SRS to the metastatic lesions to the brain.  Her last treatment is scheduled for 12/12/2020 under the care of Dr. MLisbeth Renshaw  Also while  in the hospital, orthopedics was consulted for a minimally displaced right femoral neck fracture.  She had a right hip hemiarthroplasty performed on 11/22/2020.  She was discharged with physical therapy.  Today, the patient denies any concerning complaints except for fatigue.  She denies any fever, chills, or night sweats.  She states that she had lost weight previously but started gaining it back.  She is reporting her baseline shortness of breath with exertion for which she is on supplemental oxygen.  She denies any cough.  She denies any chest pain or hemoptysis.  She denies any nausea, vomiting, diarrhea, or constipation.  She denies any headache or visual changes.  The patient is here today for evaluation, and for more detailed discussion about her current condition and recommended treatment options.     MEDICAL HISTORY: Past Medical History:  Diagnosis Date  . Anemia   . [place]   . Hepatitis    hx of hep C - 10-20 years ago   . History of blood transfusion   . History of kidney cancer   . Hyperlipidemia   . Hypertension    not taken b/p med in 2-3 years md deceased never went to another md   . [place]al cell carcinoma [place] [place]  . Stage 4 lung cancer ([facility]    new - due to start therapy with next appt on 12/14    ALLERGIES:  has No Known Allergies.  MEDICATIONS:  Current Outpatient Medications  Medication Sig Dispense Refill  . amLODipine (NORVASC) 10 MG tablet Take 1 tablet (10 mg total) by mouth daily.  90 tablet 3  . ascorbic acid (VITAMIN C) 500 MG tablet Take 500 mg by mouth 2 (two) times daily.     . Black Currant Seed Oil 500 MG CAPS Take 500 mg by mouth every morning.     . Calcium Carbonate-Vit D-Min (CALCIUM 1200 PO) Take 1,200 mg by mouth every morning.     . carvedilol (COREG) 12.5 MG tablet Take 1 tablet (12.5 mg total) by mouth 2 (two) times daily with a meal. 60 tablet 1  . Cholecalciferol (VITAMIN D3) 50 MCG (2000 UT) capsule Take 2,000 Units by  mouth every morning.    Marland Kitchen dexamethasone (DECADRON) 4 MG tablet Take 1 tablet (4 mg total) by mouth every 8 (eight) hours. 90 tablet 0  . docusate sodium (COLACE) 100 MG capsule Take 1 capsule (100 mg total) by mouth 2 (two) times daily. 10 capsule 0  . enoxaparin (LOVENOX) 30 MG/0.3ML injection Inject 0.3 mLs (30 mg total) into the skin daily for 14 days. 4.2 mL 0  . Garlic 1062 MG CAPS Take 2,000 mg by mouth daily.     . Multiple Vitamin (MULTIVITAMIN WITH MINERALS) TABS tablet Take 1 tablet by mouth every morning.     . Omega-3 Fatty Acids (FISH OIL PO) Take 1 capsule by mouth every morning.     . ondansetron (ZOFRAN) 4 MG tablet Take 1 tablet (4 mg total) by mouth every 8 (eight) hours as needed for nausea or vomiting. 30 tablet 1  . oxyCODONE-acetaminophen (PERCOCET) 5-325 MG tablet Take 1-2 tablets by mouth every 8 (eight) hours as needed for severe pain. 30 tablet 0  . pantoprazole (PROTONIX) 40 MG tablet Take 1 tablet (40 mg total) by mouth daily. 30 tablet 0  . polyethylene glycol (MIRALAX / GLYCOLAX) 17 g packet Take 17 g by mouth daily as needed for mild constipation.    . rosuvastatin (CRESTOR) 10 MG tablet Take 1 tablet (10 mg total) by mouth daily. 90 tablet 3  . senna-docusate (SENOKOT-S) 8.6-50 MG tablet Take 1 tablet by mouth daily as needed for mild constipation.    Marland Kitchen telmisartan (MICARDIS) 80 MG tablet Take 80 mg by mouth daily.     No current facility-administered medications for this visit.    SURGICAL HISTORY:  Past Surgical History:  Procedure Laterality Date  . ANTERIOR APPROACH HEMI HIP ARTHROPLASTY Right 11/22/2020   Procedure: ANTERIOR APPROACH RIGHT HIP HEMIARTHROPLASTY;  Surgeon: Leandrew Koyanagi, MD;  Location: [facility];  Service: Orthopedics;  Laterality: Right;  Needs RNFA  . AV FISTULA PLACEMENT Left 08/07/2020   Procedure: LEFT ARM ARTERIOVENOUS (AV) FISTULA CREATION;  Surgeon: [name], MD;  Location: [facility];  Service: Vascular;  Laterality: Left;  . BRONCHIAL  BIOPSY  11/05/2020   Procedure: BRONCHIAL BIOPSIES;  Surgeon: Collene Gobble, MD;  Location: [facility] ENDOSCOPY;  Service: Cardiopulmonary;;  . BRONCHIAL BRUSHINGS  11/05/2020   Procedure: BRONCHIAL BRUSHINGS;  Surgeon: Collene Gobble, MD;  Location: [facility];  Service: Cardiopulmonary;;  . BRONCHIAL NEEDLE ASPIRATION BIOPSY  11/05/2020   Procedure: BRONCHIAL NEEDLE ASPIRATION BIOPSIES;  Surgeon: Collene Gobble, MD;  Location: [facility];  Service: Cardiopulmonary;;  . CATARACT EXTRACTION Bilateral    11/30 left /12/9 right  . ELECTROMAGNETIC NAVIGATION BROCHOSCOPY N/A 11/05/2020   Procedure: ELECTROMAGNETIC NAVIGATION BRONCHOSCOPY;  Surgeon: Collene Gobble, MD;  Location: [facility];  Service: Cardiopulmonary;  Laterality: N/A;  . ENDOBRONCHIAL ULTRASOUND N/A 11/05/2020   Procedure: ENDOBRONCHIAL ULTRASOUND;  Surgeon: Collene Gobble, MD;  Location: [facility] ENDOSCOPY;  Service: Cardiopulmonary;  Laterality: N/A;  . FINE NEEDLE ASPIRATION  11/05/2020   Procedure: FINE NEEDLE ASPIRATION (FNA) LINEAR;  Surgeon: Collene Gobble, MD;  Location: [facility]OSCOPY;  Service: Cardiopulmonary;;  . IR FLUORO GUIDE CV LINE RIGHT  07/27/2020  . IR THORACENTESIS ASP PLEURAL SPACE W/IMG GUIDE  10/24/2020  . IR THORACENTESIS ASP PLEURAL SPACE W/IMG GUIDE  10/25/2020  . IR THORACENTESIS ASP PLEURAL SPACE W/IMG GUIDE  10/31/2020  . IR US GUIDE VASC ACCESS RIGHT  07/27/2020  . ROBOT ASSISTED LAPAROSCOPIC NEPHRECTOMY Right 01/13/2017   Procedure: XI ROBOTIC ASSISTED LAPAROSCOPIC RADICAL NEPHRECTOMY WITH LYSIS OF ADHESION;  Surgeon: Alexis Frock, MD;  Location: [facility];  Service: Urology;  Laterality: Right;  Marland Kitchen VIDEO BRONCHOSCOPY N/A 11/05/2020   Procedure: VIDEO BRONCHOSCOPY WITH FLUORO;  Surgeon: Collene Gobble, MD;  Location: [facility] END[facility]COPY;  Service: Cardiopulmonary;  Laterality: N/A;    REVIEW OF SYSTEMS:   Review of Systems  Constitutional: Positive for fatigue.  Negative for appetite change, chills, fever  and unexpected weight change.  HENT: Negative for mouth sores, nosebleeds, sore throat and trouble swallowing.   Eyes: Negative for eye problems and icterus.  Respiratory: Positive for baseline dyspnea.  Negative for cough, hemoptysis,  and wheezing.   Cardiovascular: Negative for chest pain and leg swelling.  Gastrointestinal: Negative for abdominal pain, constipation, diarrhea, nausea and vomiting.  Genitourinary: Negative for bladder incontinence, difficulty urinating, dysuria, frequency and hematuria.   Musculoskeletal: Negative for back pain, gait problem, neck pain and neck stiffness.  Skin: Negative for itching and rash.  Neurological: Negative for dizziness, extremity weakness, gait problem, headaches, light-headedness and seizures.  Hematological: Negative for adenopathy. Does not bruise/bleed easily.  Psychiatric/Behavioral: Negative for confusion, depression and sleep disturbance. The patient is not nervous/anxious.     PHYSICAL EXAMINATION:  There were no vitals taken for this visit.  ECOG PERFORMANCE STATUS: 2 - Symptomatic, <50% confined to bed  Physical Exam  Constitutional: Oriented to person, place, and time and well-developed, well-nourished, and in no distress.  HENT:  Head: Normocephalic and atraumatic.  Mouth/Throat: Oropharynx is clear and moist. No oropharyngeal exudate.  Eyes: Conjunctivae are normal. Right eye exhibits no discharge. Left eye exhibits no discharge. No scleral icterus.  Neck: Normal range of motion. Neck supple.  Cardiovascular: Normal rate, regular rhythm, systolic murmur noted sounds and intact distal pulses.   Pulmonary/Chest: Effort normal.  Quiet breath sounds in all lung fields no respiratory distress. No wheezes. No rales.  On supplemental oxygen Abdominal: Soft. Bowel sounds are normal. Exhibits no distension and no mass. There is no tenderness.  Musculoskeletal: Normal range of motion. Exhibits no edema.  Lymphadenopathy:    No cervical  adenopathy.  Neurological: Alert and oriented to person, place, and time. Exhibits muscle wasting.  The patient was examined in the wheelchair. Skin: Skin is warm and dry. No rash noted. Not diaphoretic. No erythema. No pallor.  Psychiatric: Mood, memory and judgment normal.  Vitals reviewed.  LABORATORY DATA: Lab Results  Component Value Date   WBC 19.8 (H) 11/27/2020   HGB 7.1 (L) 11/27/2020   HCT 22.0 (L) 11/27/2020   MCV 88.4 11/27/2020   PLT 339 11/27/2020      Chemistry      Component Value Date/Time   NA 134 (L) 11/27/2020 0203   NA 137 02/18/2015 0000   K 3.2 (L) 11/27/2020 0203   CL 95 (L) 11/27/2020 0203   CO2 27 11/27/2020 0203   BUN  46 (H) 11/27/2020 0203   BUN 15 02/18/2015 0000   CREATININE 3.86 (H) 11/27/2020 0203   CREATININE 7.90 (HH) 11/18/2020 1338   GLU 122 02/18/2015 0000      Component Value Date/Time   CALCIUM 7.9 (L) 11/27/2020 0203   CALCIUM 8.1 (L) 08/06/2020 0059   ALKPHOS 171 (H) 11/27/2020 0203   AST 47 (H) 11/27/2020 0203   AST 25 11/18/2020 1338   ALT 13 11/27/2020 0203   ALT 18 11/18/2020 1338   BILITOT 0.6 11/27/2020 0203   BILITOT 0.5 11/18/2020 1338       RADIOGRAPHIC STUDIES:  DG Chest 2 View  Result Date: 11/20/2020 CLINICAL DATA:  cp EXAM: CHEST - 2 VIEW COMPARISON:  11/18/2020 FINDINGS: Tunneled right IJ CVC tip overlies the superior cavoatrial junction. Cardiomegaly with central pulmonary vascular congestion. Redemonstration of small bilateral pleural effusions. New patchy bilateral pulmonary opacities. Multilevel spondylosis. IMPRESSION: 1. Cardiomegaly with central pulmonary vascular congestion. Redemonstration of small bilateral pleural effusions. 2. New confluent/patchy bilateral pulmonary opacities. Differential includes edema versus infection. Electronically Signed   By: Primitivo Gauze M.D.   On: 11/20/2020 13:29   MR BRAIN WO CONTRAST  Result Date: 11/24/2020 CLINICAL DATA:  79 year old female with cerebral  metastatic disease. Postoperative day 2 ORIF for pathologic right femoral fracture. EXAM: MRI HEAD WITHOUT CONTRAST TECHNIQUE: Multiplanar, multiecho pulse sequences of the brain and surrounding structures were obtained without intravenous contrast. COMPARISON:  Brain MRI without contrast 01/23/2020. FINDINGS: Brain: Confluent vasogenic edema in the posterior left frontal and superior left parietal lobes surrounding a lobulated 14 mm presumed isointense cerebral metastasis is stable from 11/21/2020. No significant regional mass effect. Other predominantly T2 dark, FLAIR heterogeneous brain masses appear stable, some with associated hemosiderin as before (medial left occipital lobe). And additional lesions are possible given the absence of IV contrast on both of these exams. However, one lesion of the anterior left frontal lobe has substantially regressed in the short-term based on T2 and FLAIR imaging (series 6, image 21) with resolved adjacent edema. No new areas of vasogenic edema. No significant intracranial mass effect. No superimposed restricted diffusion to suggest acute infarction. No ventriculomegaly, extra-axial collection or acute intracranial hemorrhage. Cervicomedullary junction and pituitary are within normal limits. Vascular: Major intracranial vascular flow voids are stable. Dominant right vertebral artery again suspected. Skull and upper cervical spine: Visualized bone marrow signal is within normal limits. Grossly normal visible cervical spine. Sinuses/Orbits: Stable, negative. Other: Mastoids remain clear. Grossly normal visible internal auditory structures. IMPRESSION: 1. Stable metastatic disease related cerebral edema since the noncontrast scan on 11/21/2020. No new or progressive lesions are evident in the absence of contrast. Postcontrast imaging likely would identify additional small metastases. 2. Regression of a single small anterior left frontal lobe lesion since 11/21/2020 with largely  resolved edema there. 3. No new intracranial abnormality. Electronically Signed   By: Genevie Ann M.D.   On: 11/24/2020 15:53   MR BRAIN WO CONTRAST  Result Date: 11/21/2020 CLINICAL DATA:  Metastatic lung cancer, history of renal cell carcinoma EXAM: MRI HEAD WITHOUT CONTRAST TECHNIQUE: Multiplanar, multiecho pulse sequences of the brain and surrounding structures were obtained without intravenous contrast. COMPARISON:  None. FINDINGS: Brain: Suboptimal evaluation for metastatic disease without intravenous contrast. Several lesions are identified and annotated primarily on axial T2 series 6: 1.4 x 0.8 cm lesion of the left postcentral gyrus with moderate surrounding edema on series 6, image 20; 0.5 cm lesion of the left superior frontal gyrus on series 6, image  22; 0.7 cm lesion of the left superior frontal gyrus more inferiorly with minimal edema on series 6, image 18; 2.6 x 1.7 cm lesion of the right lateral ventricle along the septum pellucidum on series 6, image 15; 0.5 cm lesion of the parasagittal left occipital lobe on series 6, image 13 with corresponding susceptibility reflecting chronic blood products or mineralization; 1 cm lesion of lateral left cerebellum with mild surrounding edema on series 7, image 6; Question of a small area of edema versus gliosis involving the lateral left precentral gyrus on series 7, image 16 without definite lesion. There is no acute infarction or intracranial hemorrhage. There is no hydrocephalus or extra-axial fluid collection. Prominence of the ventricles and sulci reflects minor generalized parenchymal volume loss. Vascular: Major vessel flow voids at the skull base are preserved. Skull and upper cervical spine: Normal marrow signal is preserved. Sinuses/Orbits: Trace mucosal thickening. Bilateral lens replacements. Other: Sella is unremarkable.  Mastoid air cells are clear. IMPRESSION: Six intracranial lesions are identified likely reflecting metastases. There is edema  associated with the larger lesions but no significant mass effect. Question of a small area of edema versus gliosis in the posterior left frontal lobe without definite underlying lesion. Electronically Signed   By: Macy Mis M.D.   On: 11/21/2020 13:04   MR BRAIN W WO CONTRAST  Result Date: 11/26/2020 CLINICAL DATA:  Metastatic lung cancer, abnormal noncontrast brain MRI EXAM: MRI HEAD WITHOUT AND WITH CONTRAST TECHNIQUE: Multiplanar, multiecho pulse sequences of the brain and surrounding structures were obtained without and with intravenous contrast. CONTRAST:  41m GADAVIST GADOBUTROL 1 MMOL/ML IV SOLN COMPARISON:  Multiple recent noncontrast studies FINDINGS: Brain: Multiple lesions are again identified and are annotated on series 1100. There has been no change in size since recent prior imaging. A total of 8 lesions are identified on images 111, 146, 160, 177, 205, 217, 224, and 229. In comparison to the noncontrast imaging, this reflects identification of an additional left frontal and a right thalamic metastasis. The left occipital lesion demonstrates intrinsic T1 shortening and corresponding susceptibility and could conceivably reflect a cavernous malformation but given associated edema, metastasis is more likely. Stable associated edema. No significant mass effect. Incidental note is made of a left frontal developmental venous anomaly with adjacent gliosis. No acute infarction hemorrhage. Vascular: Major vessel flow voids at the skull base are preserved. Skull and upper cervical spine: No new findings. Sinuses/Orbits: No new findings. Other: Sella is unremarkable.  Mastoid air cells are clear. IMPRESSION: 8 metastatic lesions with stable edema. Left frontal and right thalamic lesions identified in addition to those on prior noncontrast imaging. Electronically Signed   By: PMacy MisM.D.   On: 11/26/2020 13:29   DG Chest Bilateral Decubitus  Result Date: 11/18/2020 CLINICAL DATA:   79year old female with history of pleural effusions and thoracentesis, chest CT last month revealing left hilar mass. Shortness of breath. New right hip pain. EXAM: CHEST - BILATERAL DECUBITUS VIEW COMPARISON:  Portable chest 11/05/2020. Chest CTA 11/02/2020, and earlier. FINDINGS: Bilateral lateral decubitus views of the chest are provided. Both sides demonstrate layering pleural fluid, fairly symmetric with fluid to about 1.5 cm thickness on both sides. This is similar to the CTA chest 11/02/2020. The right side non dependent costophrenic angle clears better than the left. Grossly stable mediastinal contours, and right chest dual lumen dialysis type catheter remains in place. IMPRESSION: Fairly symmetric bilateral layering pleural effusions, fluid volume likely not significantly changed from the CTA 11/02/2020. Electronically  Signed   By: Genevie Ann M.D.   On: 11/18/2020 16:38   Pelvis Portable  Result Date: 11/22/2020 CLINICAL DATA:  79 year old female status post hip replacement following pathologic fracture secondary to metastatic cancer. EXAM: PORTABLE PELVIS 1-2 VIEWS COMPARISON:  Intraoperative images 1359 hours today. FINDINGS: Portable AP view at 1552 hours. Bipolar right hip arthroplasty. Hardware appears intact with normal AP alignment. No unexpected osseous changes. Regional postoperative soft tissue gas. Lytic lesion in the superior left femoral neck redemonstrated. Small volume retained oral contrast in the descending colon. Calcified uterine fibroid. IMPRESSION: 1. Bipolar right hip arthroplasty with no adverse features. 2. Proximal left femoral lytic metastasis. Electronically Signed   By: Genevie Ann M.D.   On: 11/22/2020 16:47   MR FEMUR RIGHT W WO CONTRAST  Result Date: 11/20/2020 CLINICAL DATA:  Fracture with metastatic disease EXAM: MRI OF THE RIGHT FEMUR WITHOUT AND WITH CONTRAST TECHNIQUE: Multiplanar, multisequence MR imaging of the right was performed both before and after administration  of intravenous contrast. CONTRAST:  71m GADAVIST GADOBUTROL 1 MMOL/ML IV SOLN COMPARISON:  None. FINDINGS: Bones/Joint/Cartilage There is a nondisplaced slightly impacted subcapital right femoral neck fracture as on the recent exam. There is a heterogeneous T1 isointense/heterogeneously T2 bright peripherally enhancing lesion within the medial right femoral neck. Mild surrounding marrow edema seen. No other fracture is identified. There is moderate bilateral hip osteoarthritis. Within the left femoral head/neck there is also a T1 hypointense/T2 heterogeneous lesion. A moderate right hip joint effusion is seen. Ligaments The ligamentum teres appears to be grossly intact Muscles and Tendons There is edema seen within the adductor musculature and iliopsoas musculature. The tendons appear to be grossly intact. Soft tissues Soft tissue swelling seen along the lateral aspect of the hip. Multiple hypointense uterine fibroids are seen. There is cystic lesion seen within the left adnexa which is partially visualized. IMPRESSION: Nondisplaced slightly impacted subcapital right femoral neck pathologic fracture. There is a osseous lesion within the right femoral neck, likely metastatic lesion . Would recommend nuclear medicine bone scan for further evaluation other osseous lesions. Osseous lesion within the left femoral head/neck concerning for metastatic lesion. Moderate right hip osteoarthritis and joint effusion Electronically Signed   By: BPrudencio PairM.D.   On: 11/20/2020 20:18   DG Chest Port 1 View  Result Date: 11/05/2020 CLINICAL DATA:  Patient status post left bronchoscopy today. EXAM: PORTABLE CHEST 1 VIEW COMPARISON:  Single-view of the chest 11/01/2019. FINDINGS: Negative for pneumothorax after bronchoscopy. Small bilateral pleural effusions and basilar atelectasis are worse on the left. Heart size is normal. Aortic atherosclerosis. Dialysis catheter is unchanged. IMPRESSION: Negative for pneumothorax after  bronchoscopy. Small bilateral pleural effusions and basilar airspace disease, worse on the left. Electronically Signed   By: TInge RiseM.D.   On: 11/05/2020 12:17   DG C-Arm 1-60 Min  Result Date: 11/22/2020 CLINICAL DATA:  Right hip hemiarthroplasty. EXAM: DG C-ARM 1-60 MIN; OPERATIVE RIGHT HIP WITH PELVIS FLUOROSCOPY TIME:  Fluoroscopy Time:  6.9 seconds COMPARISON:  November 18, 2020. FINDINGS: Four C-arm fluoroscopic images were obtained intraoperatively and submitted for post operative interpretation. These images demonstrate surgical changes of right hip arthroplasty. Please see the performing provider's procedural report for further detail. IMPRESSION: Intraoperative fluoroscopic images, as detailed above. Electronically Signed   By: FMargaretha SheffieldMD   On: 11/22/2020 15:17   DG Hip Unilat W or W/O Pelvis 1 View Right  Addendum Date: 11/18/2020   ADDENDUM REPORT: 11/18/2020 16:53 ADDENDUM: Study discussed  by telephone with PA-C VAN TANNER on 11/18/2020 at 1645 hours. Electronically Signed   By: Genevie Ann M.D.   On: 11/18/2020 16:53   Result Date: 11/18/2020 CLINICAL DATA:  79 year old female with history of pleural effusions and thoracentesis, chest CT last month revealing left hilar mass. Shortness of breath. New right hip pain. EXAM: DG HIP (WITH OR WITHOUT PELVIS) 1V RIGHT COMPARISON:  CT Abdomen and Pelvis 04/08/2020. FINDINGS: There is a minimally displaced right femoral neck fracture. The fracture appears to extend from the superior mid neck toward the inferior subcapital neck region. The right femoral head remains normally located. The right femur intertrochanteric segment and visible proximal femoral shaft appear intact. Bone mineralization about the pelvis and proximal femurs does not appear significantly changed from the April CT. No destructive osseous lesion identified radiographically. Pelvis and proximal left femur appear intact. Chronic calcified uterine fibroid again noted.  Negative visible other lower abdominal and pelvic visceral contours. IMPRESSION: 1. Minimally displaced right femoral neck fracture, suspicious for pathologic fracture in this setting. 2. MRI versus whole-body bone scan would probably be most sensitive for detection of osseous metastatic disease. Electronically Signed: By: Genevie Ann M.D. On: 11/18/2020 16:41   DG HIP OPERATIVE UNILAT WITH PELVIS RIGHT  Result Date: 11/22/2020 CLINICAL DATA:  Right hip hemiarthroplasty. EXAM: DG C-ARM 1-60 MIN; OPERATIVE RIGHT HIP WITH PELVIS FLUOROSCOPY TIME:  Fluoroscopy Time:  6.9 seconds COMPARISON:  November 18, 2020. FINDINGS: Four C-arm fluoroscopic images were obtained intraoperatively and submitted for post operative interpretation. These images demonstrate surgical changes of right hip arthroplasty. Please see the performing provider's procedural report for further detail. IMPRESSION: Intraoperative fluoroscopic images, as detailed above. Electronically Signed   By: Margaretha Sheffield MD   On: 11/22/2020 15:17   DG C-ARM BRONCHOSCOPY  Result Date: 11/05/2020 C-ARM BRONCHOSCOPY: Fluoroscopy was utilized by the requesting physician.  No radiographic interpretation.     ASSESSMENT/PLAN:  This is a very pleasant 79 years old African-American female recently diagnosed with stage IV (T3, N2, M1c) non-small cell lung cancer, adenocarcinoma presented with large left lower lobe lung mass in addition to left hilar and mediastinal lymphadenopathy as well as pleural-based metastasis in the left lung diagnosed in November 2021. She was also found to have metastatic disease to to the brain. She is pending further staging work-up with her PET scan. PDL1 expression is 25%. Molecular studies by guardant 360 shows no actionable mutations  The patient recently had a right hip hemiarthroplasty performed on 11/22/2020. Her for a suspicious pathological fracture.  The patient is currently undergoing SRS to the metastatic brain  lesions under the care of Dr. Lisbeth Renshaw.  Her last treatment is scheduled for 12/12/2020.  The patient was seen with Dr. Julien Nordmann today.  The patient still requires a staging PET scan.  However she is stage IV.  Dr. Julien Nordmann had a lengthy discussion today about the patient's current condition and recommended treatment options. The patient was given the option of a referral to hospice/palliative vs. Treatment with systemic chemotherapy with carboplatin for an AUC of 5, paclitaxel 125 mg per metered squared, and Keytruda 200 mg IV every 3 weeks.  The patient is not a good candidate for Alimta due to her chronic kidney disease.  The patient would like time to consider her options, but in the meantime, we will tentatively plan on arranging for the patient's first treatment on 12/17/2020.  If she changes her mind, the patient was advised to contact us either way.  We discussed the adverse side effects of treatment including but not limited to alopecia, myelosuppression, nausea and vomiting, peripheral neuropathy, liver or renal dysfunction as well as immunotherapy mediated adverse effects.   I will arrange for the patient to have a chemoeducation class prior to receiving her first cycle of chemotherapy.    I sent a prescription for Compazine 10 mg every 6 hours as needed for nausea.   The patient will follow-up in 2 weeks for evaluation on day one of her treatment.  The patient is anemic likely secondary to her chronic kidney disease.  We are able to administer 1 unit of blood tomorrow on 12/06/2020.  We will try to administer a second unit of blood sometime next week based on availability.  The patient was advised to call radiology scheduling and reschedule her staging PET scan.  The patient was advised to call immediately if she has any concerning symptoms in the interval. The patient voices understanding of current disease status and treatment options and is in agreement with the current care plan. All  questions were answered. The patient knows to call the clinic with any problems, questions or concerns. We can certainly see the patient much sooner if necessary  No orders of the defined types were placed in this encounter.    Cassandra Dyer L Ronnel Zuercher, PA-C 12/02/20  ADDENDUM: Hematology/Oncology Attending: I had a face-to-face encounter with the patient today.  I recommended her care plan.  This is a very pleasant 79 years old African-American female with end-stage renal disease and currently on hemodialysis.  The patient was recently diagnosed with a stage IV non-small cell lung cancer, adenocarcinoma with no actionable mutations and PD-L1 expression of 25%.  She was also found recently to have multiple brain metastasis.  She is currently undergoing SRS to the brain lesions. The patient was supposed to have a PET scan but she missed her appointment. I had a lengthy discussion with the patient and her sister today about her current disease stage, prognosis and treatment options.  She understands that she has incurable condition and all the treatment will be of palliative nature. I gave her the option of palliative care and hospice referral versus consideration of palliative systemic chemotherapy with carboplatin for AUC of 5, paclitaxel 175 mg/M2 and Keytruda 200 mg IV every 3 weeks with Neulasta support.  The patient is not a good candidate for treatment with pemetrexed because of her end-stage renal disease. I discussed with the patient the adverse effect of this treatment including but not limited to alopecia, myelosuppression, nausea and vomiting, peripheral neuropathy, liver or renal dysfunction. We will plan for the patient to start the first cycle of her treatment in around 2 weeks after completion of her brain radiation. The patient is still thinking about her options but she will come on time for the treatment if she decides to proceed with systemic chemotherapy. I also strongly  recommend for the patient to have a PET scan done for further evaluation and staging of her disease. For the anemia of chronic kidney disease, we will arrange for the patient to receive 2 units of PRBCs transfusion in the next few days. She was advised to call immediately if she has any other concerning symptoms in the interval.  Disclaimer: This note was dictated with voice recognition software. Similar sounding words can inadvertently be transcribed and may be missed upon review. Eilleen Kempf, MD 12/05/20

## 2020-12-02 NOTE — Telephone Encounter (Signed)
Pt is s/p a right hip Hemi 11/22/20 called and lm on vm to give verbal ok for orders as requested below. To call with any any questions.

## 2020-12-03 ENCOUNTER — Ambulatory Visit (INDEPENDENT_AMBULATORY_CARE_PROVIDER_SITE_OTHER): Payer: Medicare Other

## 2020-12-03 ENCOUNTER — Telehealth: Payer: Self-pay | Admitting: Physician Assistant

## 2020-12-03 DIAGNOSIS — Z0000 Encounter for general adult medical examination without abnormal findings: Secondary | ICD-10-CM | POA: Diagnosis not present

## 2020-12-03 NOTE — Telephone Encounter (Signed)
I am scheduled to see this patient on 12/05/20. I noticed her PET scan has not been scheduled (likely cancelled during her recent hospital admission?). I was unable to reach her but instructed her to call radiology scheduling [phone]) to reschedule her PET scan for the earliest available. If she has any questions, advised to call us back ([phone]). Otherwise, I will review this again with her when I see her at her next appointment later this week.

## 2020-12-03 NOTE — Patient Instructions (Signed)
Cassandra Dyer , Thank you for taking time to come for your Medicare Wellness Visit. I appreciate your ongoing commitment to your health goals. Please review the following plan we discussed and let me know if I can assist you in the future.   Screening recommendations/referrals: Colonoscopy: no repeat due to age 79: 03/14/2020 Bone Density: 12/30/2017 Recommended yearly ophthalmology/optometry visit for glaucoma screening and checkup Recommended yearly dental visit for hygiene and checkup  Vaccinations: Influenza vaccine: declined Pneumococcal vaccine: declined Tdap vaccine: 05/16/2015; due every 10 years Shingles vaccine: declined   Covid-19: up to date  Advanced directives: Advance directive discussed with you today. Even though you declined this today please call our office should you change your mind and we can give you the proper paperwork for you to fill out.  Conditions/risks identified: Yes. Reviewed health maintenance screenings with patient today and relevant education, vaccines, and/or referrals were provided. Continue doing brain stimulating activities (puzzles, reading, adult coloring books, staying active) to keep memory sharp. Continue to eat heart healthy diet (full of fruits, vegetables, whole grains, lean protein, water--limit salt, fat, and sugar intake) and increase physical activity as tolerated.  Next appointment: Please schedule your next Medicare Wellness Visit with your Nurse Health Advisor in 1 year by calling [phone].   Preventive Care 14 Years and Older, Female Preventive care refers to lifestyle choices and visits with your health care provider that can promote health and wellness. What does preventive care include?  A yearly physical exam. This is also called an annual well check.  Dental exams once or twice a year.  Routine eye exams. Ask your health care provider how often you should have your eyes checked.  Personal lifestyle choices,  including:  Daily care of your teeth and gums.  Regular physical activity.  Eating a healthy diet.  Avoiding tobacco and drug use.  Limiting alcohol use.  Practicing safe sex.  Taking low-dose aspirin every day.  Taking vitamin and mineral supplements as recommended by your health care provider. What happens during an annual well check? The services and screenings done by your health care provider during your annual well check will depend on your age, overall health, lifestyle risk factors, and family history of disease. Counseling  Your health care provider may ask you questions about your:  Alcohol use.  Tobacco use.  Drug use.  Emotional well-being.  Home and relationship well-being.  Sexual activity.  Eating habits.  History of falls.  Memory and ability to understand (cognition).  Work and work Statistician.  Reproductive health. Screening  You may have the following tests or measurements:  Height, weight, and BMI.  Blood pressure.  Lipid and cholesterol levels. These may be checked every 5 years, or more frequently if you are over 60 years old.  Skin check.  Lung cancer screening. You may have this screening every year starting at age 59 if you have a 30-pack-year history of smoking and currently smoke or have quit within the past 15 years.  Fecal occult blood test (FOBT) of the stool. You may have this test every year starting at age 11.  Flexible sigmoidoscopy or colonoscopy. You may have a sigmoidoscopy every 5 years or a colonoscopy every 10 years starting at age 14.  Hepatitis C blood test.  Hepatitis B blood test.  Sexually transmitted disease (STD) testing.  Diabetes screening. This is done by checking your blood sugar (glucose) after you have not eaten for a while (fasting). You may have this done every 1-3  years.  Bone density scan. This is done to screen for osteoporosis. You may have this done starting at age 41.  Mammogram. This  may be done every 1-2 years. Talk to your health care provider about how often you should have regular mammograms. Talk with your health care provider about your test results, treatment options, and if necessary, the need for more tests. Vaccines  Your health care provider may recommend certain vaccines, such as:  Influenza vaccine. This is recommended every year.  Tetanus, diphtheria, and acellular pertussis (Tdap, Td) vaccine. You may need a Td booster every 10 years.  Zoster vaccine. You may need this after age 40.  Pneumococcal 13-valent conjugate (PCV13) vaccine. One dose is recommended after age 74.  Pneumococcal polysaccharide (PPSV23) vaccine. One dose is recommended after age 15. Talk to your health care provider about which screenings and vaccines you need and how often you need them. This information is not intended to replace advice given to you by your health care provider. Make sure you discuss any questions you have with your health care provider. Document Released: 12/27/2015 Document Revised: 08/19/2016 Document Reviewed: 10/01/2015 Elsevier Interactive Patient Education  2017 [employer] Prevention in the Home Falls can cause injuries. They can happen to people of all ages. There are many things you can do to make your home safe and to help prevent falls. What can I do on the outside of my home?  Regularly fix the edges of walkways and driveways and fix any cracks.  Remove anything that might make you trip as you walk through a door, such as a raised step or threshold.  Trim any bushes or trees on the path to your home.  Use bright outdoor lighting.  Clear any walking paths of anything that might make someone trip, such as rocks or tools.  Regularly check to see if handrails are loose or broken. Make sure that both sides of any steps have handrails.  Any raised decks and porches should have guardrails on the edges.  Have any leaves, snow, or ice cleared  regularly.  Use sand or salt on walking paths during winter.  Clean up any spills in your garage right away. This includes oil or grease spills. What can I do in the bathroom?  Use night lights.  Install grab bars by the toilet and in the tub and shower. Do not use towel bars as grab bars.  Use non-skid mats or decals in the tub or shower.  If you need to sit down in the shower, use a plastic, non-slip stool.  Keep the floor dry. Clean up any water that spills on the floor as soon as it happens.  Remove soap buildup in the tub or shower regularly.  Attach bath mats securely with double-sided non-slip rug tape.  Do not have throw rugs and other things on the floor that can make you trip. What can I do in the bedroom?  Use night lights.  Make sure that you have a light by your bed that is easy to reach.  Do not use any sheets or blankets that are too big for your bed. They should not hang down onto the floor.  Have a firm chair that has side arms. You can use this for support while you get dressed.  Do not have throw rugs and other things on the floor that can make you trip. What can I do in the kitchen?  Clean up any spills right away.  Avoid walking on wet floors.  Keep items that you use a lot in easy-to-reach places.  If you need to reach something above you, use a strong step stool that has a grab bar.  Keep electrical cords out of the way.  Do not use floor polish or wax that makes floors slippery. If you must use wax, use non-skid floor wax.  Do not have throw rugs and other things on the floor that can make you trip. What can I do with my stairs?  Do not leave any items on the stairs.  Make sure that there are handrails on both sides of the stairs and use them. Fix handrails that are broken or loose. Make sure that handrails are as long as the stairways.  Check any carpeting to make sure that it is firmly attached to the stairs. Fix any carpet that is loose  or worn.  Avoid having throw rugs at the top or bottom of the stairs. If you do have throw rugs, attach them to the floor with carpet tape.  Make sure that you have a light switch at the top of the stairs and the bottom of the stairs. If you do not have them, ask someone to add them for you. What else can I do to help prevent falls?  Wear shoes that:  Do not have high heels.  Have rubber bottoms.  Are comfortable and fit you well.  Are closed at the toe. Do not wear sandals.  If you use a stepladder:  Make sure that it is fully opened. Do not climb a closed stepladder.  Make sure that both sides of the stepladder are locked into place.  Ask someone to hold it for you, if possible.  Clearly mark and make sure that you can see:  Any grab bars or handrails.  First and last steps.  Where the edge of each step is.  Use tools that help you move around (mobility aids) if they are needed. These include:  Canes.  Walkers.  Scooters.  Crutches.  Turn on the lights when you go into a dark area. Replace any light bulbs as soon as they burn out.  Set up your furniture so you have a clear path. Avoid moving your furniture around.  If any of your floors are uneven, fix them.  If there are any pets around you, be aware of where they are.  Review your medicines with your doctor. Some medicines can make you feel dizzy. This can increase your chance of falling. Ask your doctor what other things that you can do to help prevent falls. This information is not intended to replace advice given to you by your health care provider. Make sure you discuss any questions you have with your health care provider. Document Released: 09/26/2009 Document Revised: 05/07/2016 Document Reviewed: 01/04/2015 Elsevier Interactive Patient Education  2017 [employer].

## 2020-12-03 NOTE — Progress Notes (Signed)
I connected with Cassandra Dyer today by telephone and verified that I am speaking with the correct person using two identifiers. Location patient: home Location provider: work Persons participating in the virtual visit: Margot Oriordan and Lisette Abu, LPN.   I discussed the limitations, risks, security and privacy concerns of performing an evaluation and management service by telephone and the availability of in person appointments. I also discussed with the patient that there may be a patient responsible charge related to this service. The patient expressed understanding and verbally consented to this telephonic visit.    Interactive audio and video telecommunications were attempted between this provider and patient, however failed, due to patient having technical difficulties OR patient did not have access to video capability.  We continued and completed visit with audio only.  Some vital signs may be absent or patient reported.   Time Spent with patient on telephone encounter: 30 minutes  Subjective:   Cassandra Dyer is a 79 y.o. female who presents for Medicare Annual (Subsequent) preventive examination.  Review of Systems    No ROS. Medicare Wellness Visit. Additional risk factors are reflected in social history. Cardiac Risk Factors include: advanced age (>29men, >32 women);dyslipidemia;hypertension;family history of premature cardiovascular disease     Objective:    Today's Vitals   12/03/20 1419  PainSc: 0-No pain   There is no height or weight on file to calculate BMI.  Advanced Directives 12/03/2020 11/22/2020 11/21/2020 11/20/2020 11/14/2020 10/22/2020 09/19/2020  Does Patient Have a Medical Advance Directive? No No No No No No No  Would patient like information on creating a medical advance directive? No - Patient declined No - Patient declined No - Patient declined No - Patient declined No - Patient declined No - Patient declined No - Patient declined    Current  Medications (verified) Outpatient Encounter Medications as of 12/03/2020  Medication Sig  . amLODipine (NORVASC) 10 MG tablet Take 1 tablet (10 mg total) by mouth daily.  Marland Kitchen ascorbic acid (VITAMIN C) 500 MG tablet Take 500 mg by mouth 2 (two) times daily.   . Black Currant Seed Oil 500 MG CAPS Take 500 mg by mouth every morning.   . Calcium Carbonate-Vit D-Min (CALCIUM 1200 PO) Take 1,200 mg by mouth every morning.   . carvedilol (COREG) 12.5 MG tablet Take 1 tablet (12.5 mg total) by mouth 2 (two) times daily with a meal.  . Cholecalciferol (VITAMIN D3) 50 MCG (2000 UT) capsule Take 2,000 Units by mouth every morning.  Marland Kitchen dexamethasone (DECADRON) 4 MG tablet Take 1 tablet (4 mg total) by mouth every 8 (eight) hours.  . docusate sodium (COLACE) 100 MG capsule Take 1 capsule (100 mg total) by mouth 2 (two) times daily.  Marland Kitchen enoxaparin (LOVENOX) 30 MG/0.3ML injection Inject 0.3 mLs (30 mg total) into the skin daily for 14 days.  . Garlic 2836 MG CAPS Take 2,000 mg by mouth daily.   . Multiple Vitamin (MULTIVITAMIN WITH MINERALS) TABS tablet Take 1 tablet by mouth every morning.   . Omega-3 Fatty Acids (FISH OIL PO) Take 1 capsule by mouth every morning.   . ondansetron (ZOFRAN) 4 MG tablet Take 1 tablet (4 mg total) by mouth every 8 (eight) hours as needed for nausea or vomiting.  Marland Kitchen oxyCODONE-acetaminophen (PERCOCET) 5-325 MG tablet Take 1-2 tablets by mouth every 8 (eight) hours as needed for severe pain.  . pantoprazole (PROTONIX) 40 MG tablet Take 1 tablet (40 mg total) by mouth daily.  . polyethylene  glycol (MIRALAX / GLYCOLAX) 17 g packet Take 17 g by mouth daily as needed for mild constipation.  . rosuvastatin (CRESTOR) 10 MG tablet Take 1 tablet (10 mg total) by mouth daily.  Marland Kitchen senna-docusate (SENOKOT-S) 8.6-50 MG tablet Take 1 tablet by mouth daily as needed for mild constipation.  Marland Kitchen telmisartan (MICARDIS) 80 MG tablet Take 80 mg by mouth daily.   No facility-administered encounter  medications on file as of 12/03/2020.    Allergies (verified) Patient has no known allergies.   History: Past Medical History:  Diagnosis Date  . Anemia   . ESRD on hemodialysis ([place])   . Hepatitis    hx of hep C - 10-20 years ago   . History of blood transfusion   . History of kidney cancer   . Hyperlipidemia   . Hypertension    not taken b/p med in 2-3 years md deceased never went to another md   . Renal cell carcinoma ([place]) 2018  . Stage 4 lung cancer ([place])    new - due to start therapy with next appt on 12/14   Past Surgical History:  Procedure Laterality Date  . ANTERIOR APPROACH HEMI HIP ARTHROPLASTY Right 11/22/2020   Procedure: ANTERIOR APPROACH RIGHT HIP HEMIARTHROPLASTY;  Surgeon: Leandrew Koyanagi, MD;  Location: [place];  Service: Orthopedics;  Laterality: Right;  Needs RNFA  . AV FISTULA PLACEMENT Left 08/07/2020   Procedure: LEFT ARM ARTERIOVENOUS (AV) FISTULA CREATION;  Surgeon: [name], MD;  Location: [place];  Service: Vascular;  Laterality: Left;  . BRONCHIAL BIOPSY  11/05/2020   Procedure: BRONCHIAL BIOPSIES;  Surgeon: Collene Gobble, MD;  Location: [facility] ENDOSCOPY;  Service: Cardiopulmonary;;  . BRONCHIAL BRUSHINGS  11/05/2020   Procedure: BRONCHIAL BRUSHINGS;  Surgeon: Collene Gobble, MD;  Location: [place];  Service: Cardiopulmonary;;  . BRONCHIAL NEEDLE ASPIRATION BIOPSY  11/05/2020   Procedure: BRONCHIAL NEEDLE ASPIRATION BIOPSIES;  Surgeon: Collene Gobble, MD;  Location: [place];  Service: Cardiopulmonary;;  . CATARACT EXTRACTION Bilateral    11/30 left /12/9 right  . ELECTROMAGNETIC NAVIGATION BROCHOSCOPY N/A 11/05/2020   Procedure: ELECTROMAGNETIC NAVIGATION BRONCHOSCOPY;  Surgeon: Collene Gobble, MD;  Location: [place];  Service: Cardiopulmonary;  Laterality: N/A;  . ENDOBRONCHIAL ULTRASOUND N/A 11/05/2020   Procedure: ENDOBRONCHIAL ULTRASOUND;  Surgeon: Collene Gobble, MD;  Location: [facility] ENDOSCOPY;  Service: Cardiopulmonary;   Laterality: N/A;  . FINE NEEDLE ASPIRATION  11/05/2020   Procedure: FINE NEEDLE ASPIRATION (FNA) LINEAR;  Surgeon: Collene Gobble, MD;  Location: [place] ENDOSCOPY;  Service: Cardiopulmonary;;  . IR FLUORO GUIDE CV LINE RIGHT  07/27/2020  . IR THORACENTESIS ASP PLEURAL SPACE W/IMG GUIDE  10/24/2020  . IR THORACENTESIS ASP PLEURAL SPACE W/IMG GUIDE  10/25/2020  . IR THORACENTESIS ASP PLEURAL SPACE W/IMG GUIDE  10/31/2020  . IR US GUIDE VASC ACCESS RIGHT  07/27/2020  . ROBOT ASSISTED LAPAROSCOPIC NEPHRECTOMY Right 01/13/2017   Procedure: XI ROBOTIC ASSISTED LAPAROSCOPIC RADICAL NEPHRECTOMY WITH LYSIS OF ADHESION;  Surgeon: Alexis Frock, MD;  Location: [facility];  Service: Urology;  Laterality: Right;  Marland Kitchen VIDEO BRONCHOSCOPY N/A 11/05/2020   Procedure: VIDEO BRONCHOSCOPY WITH FLUORO;  Surgeon: Collene Gobble, MD;  Location[facility] ENDOSCOPY;  Service: Cardiopulmonary;  Laterality: N/A;   Family History  Problem Relation Age of Onset  . Hypertension Mother   . Pancreatic cancer Father   . Breast cancer Sister    Social History   Socioeconomic History  . Marital status: Significant Other  Spouse name: Not on file  . Number of children: 0  . Years of education: 9  . Highest education level: Not on file  Occupational History  . Occupation: Retired  Tobacco Use  . Smoking status: Former Smoker    Packs/day: 0.50    Years: 30.00    Pack years: 15.00    Types: Cigarettes    Quit date: 12/14/2000    Years since quitting: 19.9  . Smokeless tobacco: Never Used  Vaping Use  . Vaping Use: Never used  Substance and Sexual Activity  . Alcohol use: Yes    Comment: occasional wine   . Drug use: No  . Sexual activity: Not Currently  Other Topics Concern  . Not on file  Social History Narrative   Fun/Hobby: Watching TV and crossword puzzles   Social Determinants of Health   Financial Resource Strain: Low Risk   . Difficulty of Paying Living Expenses: Not hard at all  Food Insecurity: No Food  Insecurity  . Worried About Charity fundraiser in the Last Year: Never true  . Ran Out of Food in the Last Year: Never true  Transportation Needs: No Transportation Needs  . Lack of Transportation (Medical): No  . Lack of Transportation (Non-Medical): No  Physical Activity: Inactive  . Days of Exercise per Week: 0 days  . Minutes of Exercise per Session: 0 min  Stress: No Stress Concern Present  . Feeling of Stress : Not at all  Social Connections: Moderately Integrated  . Frequency of Communication with Friends and Family: More than three times a week  . Frequency of Social Gatherings with Friends and Family: Once a week  . Attends Religious Services: More than 4 times per year  . Active Member of Clubs or Organizations: Yes  . Attends Archivist Meetings: More than 4 times per year  . Marital Status: Widowed    Tobacco Counseling Counseling given: Not Answered   Clinical Intake:  Pre-visit preparation completed: Yes  Pain : No/denies pain Pain Score: 0-No pain     Nutritional Risks: None Diabetes: No  How often do you need to have someone help you when you read instructions, pamphlets, or other written materials from your doctor or pharmacy?: 1 - Never What is the last grade level you completed in school?: 9th grade  Diabetic?   Interpreter Needed?: No  Information entered by :: Lisette Abu, LPN   Activities of Daily Living In your present state of health, do you have any difficulty performing the following activities: 12/03/2020 11/21/2020  Hearing? N -  Vision? N -  Difficulty concentrating or making decisions? N -  Walking or climbing stairs? N -  Dressing or bathing? N -  Doing errands, shopping? Y Y  Comment patient does not drive Facilities manager and eating ? N -  Using the Toilet? N -  In the past six months, have you accidently leaked urine? N -  Do you have problems with loss of bowel control? N -  Managing your Medications? N -   Managing your Finances? N -  Housekeeping or managing your Housekeeping? N -  Some recent data might be hidden    Patient Care Team: Biagio Borg, MD as PCP - General (Internal Medicine) Center, [facility], Lenon Oms, RN as [place] Management Arlis Porta, Jayme Cloud, RN as Oncology Nurse Navigator (Medical Oncology)  Indicate any recent Medical Services you may have received from other  than [facility] providers in the past year (date may be approximate).     Assessment:   This is a routine wellness examination for [place].  Hearing/Vision screen No exam data present  Dietary issues and exercise activities discussed: Current Exercise Habits: The patient does not participate in regular exercise at present, Exercise limited by: Other - see comments;orthopedic condition(s);respiratory conditions(s) (dialysis MWF; and PT starts Tuesday)  Goals    . Patient Stated     Maintain current health status, continue to help individuals within the community, stay active in church, enjoy life and family    . Patient Stated     Continue to enjoy family, friends, stay socially and spiritually connected.      Depression Screen PHQ 2/9 Scores 12/03/2020 12/03/2020 09/19/2020 12/28/2019 11/29/2019 06/29/2019 11/24/2018  PHQ - 2 Score 0 0 0 0 0 0 0  PHQ- 9 Score - - - - - - -    Fall Risk Fall Risk  12/03/2020 09/19/2020 12/28/2019 11/29/2019 11/08/2019  Falls in the past year? 1 0 0 0 0  Comment - - - - Emmi Telephone Survey: data to providers prior to load  Number falls in past yr: 1 0 - 0 -  Injury with Fall? 1 0 - 0 -  Comment - N/A- no falls reported - - -  Risk for fall due to : - Medication side effect;Other (Comment) - - -  Risk for fall due to: Comment - hemodialysis - - -  Follow up - Falls prevention discussed - - -    FALL RISK PREVENTION PERTAINING TO THE HOME:  Any stairs in or around the home? No  If so, are there any without handrails? No   Home free of loose throw rugs in walkways, pet beds, electrical cords, etc? Yes  Adequate lighting in your home to reduce risk of falls? Yes   ASSISTIVE DEVICES UTILIZED TO PREVENT FALLS:  Life alert? No  Use of a cane, walker or w/c? Yes  Grab bars in the bathroom? No  Shower chair or bench in shower? Yes  Elevated toilet seat or a handicapped toilet? Yes   TIMED UP AND GO:  Was the test performed? No .  Length of time to ambulate 10 feet: 0 sec.   Gait slow and steady with assistive device  Cognitive Function: MMSE - Mini Mental State Exam 11/24/2018 11/11/2017  Orientation to time 5 5  Orientation to Place 5 5  Registration 3 3  Attention/ Calculation 5 5  Recall 2 2  Language- name 2 objects 2 2  Language- repeat 1 1  Language- follow 3 step command 3 3  Language- read & follow direction 1 1  Write a sentence 1 1  Copy design 1 1  Total score 29 29        Immunizations Immunization History  Administered Date(s) Administered  . PFIZER SARS-COV-2 Vaccination 02/12/2020, 03/11/2020  . Tdap 05/16/2015    TDAP status: Up to date  Flu Vaccine status: Declined, Education has been provided regarding the importance of this vaccine but patient still declined. Advised may receive this vaccine at local pharmacy or Health Dept. Aware to provide a copy of the vaccination record if obtained from local pharmacy or Health Dept. Verbalized acceptance and understanding.  Pneumococcal vaccine status: Declined,  Education has been provided regarding the importance of this vaccine but patient still declined. Advised may receive this vaccine at local pharmacy or Health Dept. Aware to provide a copy of the  vaccination record if obtained from local pharmacy or Health Dept. Verbalized acceptance and understanding.   Covid-19 vaccine status: Completed vaccines  Qualifies for Shingles Vaccine? Yes   Zostavax completed No   Shingrix Completed?: No.    Education has been provided  regarding the importance of this vaccine. Patient has been advised to call insurance company to determine out of pocket expense if they have not yet received this vaccine. Advised may also receive vaccine at local pharmacy or Health Dept. Verbalized acceptance and understanding.  Screening Tests Health Maintenance  Topic Date Due  . PNA vac Low Risk Adult (1 of 2 - PCV13) Never done  . COVID-19 Vaccine (3 - Pfizer risk 4-dose series) 04/08/2020  . INFLUENZA VACCINE  Never done  . TETANUS/TDAP  05/15/2025  . DEXA SCAN  Completed  . Hepatitis C Screening  Completed    Health Maintenance  Health Maintenance Due  Topic Date Due  . PNA vac Low Risk Adult (1 of 2 - PCV13) Never done  . COVID-19 Vaccine (3 - Pfizer risk 4-dose series) 04/08/2020  . INFLUENZA VACCINE  Never done    Colorectal cancer screening: No longer required.   Mammogram status: Completed 03/14/2020. Repeat every year  Bone Density status: Completed 12/30/2017. Results reflect: Bone density results: OSTEOPOROSIS. Repeat every 2 years.  Lung Cancer Screening: (Low Dose CT Chest recommended if Age 75-80 years, 30 pack-year currently smoking OR have quit w/in 15years.) does qualify.   Lung Cancer Screening Referral: patient recently diagnosed with Stage IV lung cancer  Additional Screening:  Hepatitis C Screening: does qualify; Completed yes  Vision Screening: Recommended annual ophthalmology exams for early detection of glaucoma and other disorders of the eye. Is the patient up to date with their annual eye exam?  Yes  Who is the provider or what is the name of the office in which the patient attends annual eye exams? Leighton Ruff, OD If pt is not established with a provider, would they like to be referred to a provider to establish care? No .   Dental Screening: Recommended annual dental exams for proper oral hygiene  Community Resource Referral / Chronic Care Management: CRR required this visit?  No   CCM  required this visit?  No      Plan:     I have personally reviewed and noted the following in the patient's chart:   . Medical and social history . Use of alcohol, tobacco or illicit drugs  . Current medications and supplements . Functional ability and status . Nutritional status . Physical activity . Advanced directives . List of other physicians . Hospitalizations, surgeries, and ER visits in previous 12 months . Vitals . Screenings to include cognitive, depression, and falls . Referrals and appointments  In addition, I have reviewed and discussed with patient certain preventive protocols, quality metrics, and best practice recommendations. A written personalized care plan for preventive services as well as general preventive health recommendations were provided to patient.     Sheral Flow, LPN   [id number]   Nurse Notes:  Patient is cogitatively intact. There were no vitals filed for this visit. There is no height or weight on file to calculate BMI.

## 2020-12-04 ENCOUNTER — Other Ambulatory Visit: Payer: Medicare Other

## 2020-12-04 ENCOUNTER — Ambulatory Visit: Payer: Medicare Other | Admitting: Physician Assistant

## 2020-12-04 DIAGNOSIS — Z510 Encounter for antineoplastic radiation therapy: Secondary | ICD-10-CM | POA: Diagnosis not present

## 2020-12-04 DIAGNOSIS — C641 Malignant neoplasm of right kidney, except renal pelvis: Secondary | ICD-10-CM | POA: Diagnosis not present

## 2020-12-04 DIAGNOSIS — Z992 Dependence on renal dialysis: Secondary | ICD-10-CM | POA: Diagnosis not present

## 2020-12-04 DIAGNOSIS — C7931 Secondary malignant neoplasm of brain: Secondary | ICD-10-CM | POA: Diagnosis not present

## 2020-12-04 DIAGNOSIS — N2581 Secondary hyperparathyroidism of renal origin: Secondary | ICD-10-CM | POA: Diagnosis not present

## 2020-12-04 DIAGNOSIS — N186 End stage renal disease: Secondary | ICD-10-CM | POA: Diagnosis not present

## 2020-12-04 DIAGNOSIS — D631 Anemia in chronic kidney disease: Secondary | ICD-10-CM | POA: Diagnosis not present

## 2020-12-04 DIAGNOSIS — C3432 Malignant neoplasm of lower lobe, left bronchus or lung: Secondary | ICD-10-CM | POA: Diagnosis not present

## 2020-12-05 ENCOUNTER — Encounter (HOSPITAL_COMMUNITY): Payer: Self-pay

## 2020-12-05 ENCOUNTER — Other Ambulatory Visit: Payer: Self-pay | Admitting: Physician Assistant

## 2020-12-05 ENCOUNTER — Inpatient Hospital Stay (HOSPITAL_BASED_OUTPATIENT_CLINIC_OR_DEPARTMENT_OTHER): Payer: Medicare Other | Admitting: Physician Assistant

## 2020-12-05 ENCOUNTER — Other Ambulatory Visit: Payer: Self-pay

## 2020-12-05 ENCOUNTER — Inpatient Hospital Stay: Payer: Medicare Other

## 2020-12-05 ENCOUNTER — Other Ambulatory Visit: Payer: Self-pay | Admitting: *Deleted

## 2020-12-05 ENCOUNTER — Ambulatory Visit
Admission: RE | Admit: 2020-12-05 | Discharge: 2020-12-05 | Disposition: A | Discharge status: Home / Self Care | Payer: Medicare Other | Source: Ambulatory Visit | Attending: Radiation Oncology | Admitting: Radiation Oncology

## 2020-12-05 ENCOUNTER — Encounter: Payer: Self-pay | Admitting: *Deleted

## 2020-12-05 ENCOUNTER — Other Ambulatory Visit: Payer: Self-pay | Admitting: Internal Medicine

## 2020-12-05 VITALS — BP 170/57 | HR 72 | Temp 97.9°F | Resp 14 | Ht 62.0 in | Wt 115.6 lb

## 2020-12-05 DIAGNOSIS — Z510 Encounter for antineoplastic radiation therapy: Secondary | ICD-10-CM | POA: Diagnosis not present

## 2020-12-05 DIAGNOSIS — Z992 Dependence on renal dialysis: Secondary | ICD-10-CM

## 2020-12-05 DIAGNOSIS — D631 Anemia in chronic kidney disease: Secondary | ICD-10-CM | POA: Diagnosis not present

## 2020-12-05 DIAGNOSIS — R5383 Other fatigue: Secondary | ICD-10-CM

## 2020-12-05 DIAGNOSIS — C7931 Secondary malignant neoplasm of brain: Secondary | ICD-10-CM | POA: Diagnosis not present

## 2020-12-05 DIAGNOSIS — Z79899 Other long term (current) drug therapy: Secondary | ICD-10-CM | POA: Diagnosis not present

## 2020-12-05 DIAGNOSIS — Z5111 Encounter for antineoplastic chemotherapy: Secondary | ICD-10-CM | POA: Diagnosis not present

## 2020-12-05 DIAGNOSIS — C3492 Malignant neoplasm of unspecified part of left bronchus or lung: Secondary | ICD-10-CM

## 2020-12-05 DIAGNOSIS — R5381 Other malaise: Secondary | ICD-10-CM | POA: Diagnosis not present

## 2020-12-05 DIAGNOSIS — M47819 Spondylosis without myelopathy or radiculopathy, site unspecified: Secondary | ICD-10-CM | POA: Diagnosis not present

## 2020-12-05 DIAGNOSIS — D259 Leiomyoma of uterus, unspecified: Secondary | ICD-10-CM | POA: Diagnosis not present

## 2020-12-05 DIAGNOSIS — R0600 Dyspnea, unspecified: Secondary | ICD-10-CM | POA: Diagnosis not present

## 2020-12-05 DIAGNOSIS — Z5112 Encounter for antineoplastic immunotherapy: Secondary | ICD-10-CM | POA: Diagnosis not present

## 2020-12-05 DIAGNOSIS — R531 Weakness: Secondary | ICD-10-CM | POA: Diagnosis not present

## 2020-12-05 DIAGNOSIS — N186 End stage renal disease: Secondary | ICD-10-CM

## 2020-12-05 DIAGNOSIS — M1611 Unilateral primary osteoarthritis, right hip: Secondary | ICD-10-CM | POA: Diagnosis not present

## 2020-12-05 DIAGNOSIS — Z905 Acquired absence of kidney: Secondary | ICD-10-CM | POA: Diagnosis not present

## 2020-12-05 DIAGNOSIS — Z85528 Personal history of other malignant neoplasm of kidney: Secondary | ICD-10-CM | POA: Diagnosis not present

## 2020-12-05 DIAGNOSIS — Z7189 Other specified counseling: Secondary | ICD-10-CM | POA: Diagnosis not present

## 2020-12-05 DIAGNOSIS — J90 Pleural effusion, not elsewhere classified: Secondary | ICD-10-CM | POA: Diagnosis not present

## 2020-12-05 DIAGNOSIS — I1311 Hypertensive heart and chronic kidney disease without heart failure, with stage 5 chronic kidney disease, or end stage renal disease: Secondary | ICD-10-CM | POA: Diagnosis not present

## 2020-12-05 DIAGNOSIS — C3432 Malignant neoplasm of lower lobe, left bronchus or lung: Secondary | ICD-10-CM | POA: Diagnosis not present

## 2020-12-05 DIAGNOSIS — Z8619 Personal history of other infectious and parasitic diseases: Secondary | ICD-10-CM | POA: Diagnosis not present

## 2020-12-05 DIAGNOSIS — Z7901 Long term (current) use of anticoagulants: Secondary | ICD-10-CM | POA: Diagnosis not present

## 2020-12-05 DIAGNOSIS — Z923 Personal history of irradiation: Secondary | ICD-10-CM | POA: Diagnosis not present

## 2020-12-05 DIAGNOSIS — I700 Atherosclerosis of aorta: Secondary | ICD-10-CM | POA: Diagnosis not present

## 2020-12-05 DIAGNOSIS — D649 Anemia, unspecified: Secondary | ICD-10-CM

## 2020-12-05 LAB — CBC WITH DIFFERENTIAL (CANCER CENTER ONLY)
Abs Immature Granulocytes: 0.3 10*3/uL — ABNORMAL HIGH (ref 0.00–0.07)
Basophils Absolute: 0 10*3/uL (ref 0.0–0.1)
Basophils Relative: 0 %
Eosinophils Absolute: 0 10*3/uL (ref 0.0–0.5)
Eosinophils Relative: 0 %
HCT: 23 % — ABNORMAL LOW (ref 36.0–46.0)
Hemoglobin: 7.4 g/dL — ABNORMAL LOW (ref 12.0–15.0)
Immature Granulocytes: 1 %
Lymphocytes Relative: 1 %
Lymphs Abs: 0.4 10*3/uL — ABNORMAL LOW (ref 0.7–4.0)
MCH: 28.5 pg (ref 26.0–34.0)
MCHC: 32.2 g/dL (ref 30.0–36.0)
MCV: 88.5 fL (ref 80.0–100.0)
Monocytes Absolute: 1 10*3/uL (ref 0.1–1.0)
Monocytes Relative: 3 %
Neutro Abs: 31.6 10*3/uL — ABNORMAL HIGH (ref 1.7–7.7)
Neutrophils Relative %: 95 %
Platelet Count: 364 10*3/uL (ref 150–400)
RBC: 2.6 MIL/uL — ABNORMAL LOW (ref 3.87–5.11)
RDW: 18.4 % — ABNORMAL HIGH (ref 11.5–15.5)
WBC Count: 33.4 10*3/uL — ABNORMAL HIGH (ref 4.0–10.5)
nRBC: 0 % (ref 0.0–0.2)

## 2020-12-05 LAB — CMP (CANCER CENTER ONLY)
ALT: 18 U/L (ref 0–44)
AST: 23 U/L (ref 15–41)
Albumin: 2.2 g/dL — ABNORMAL LOW (ref 3.5–5.0)
Alkaline Phosphatase: 162 U/L — ABNORMAL HIGH (ref 38–126)
Anion gap: 11 (ref 5–15)
BUN: 29 mg/dL — ABNORMAL HIGH (ref 8–23)
CO2: 32 mmol/L (ref 22–32)
Calcium: 8.8 mg/dL — ABNORMAL LOW (ref 8.9–10.3)
Chloride: 93 mmol/L — ABNORMAL LOW (ref 98–111)
Creatinine: 3.06 mg/dL (ref 0.44–1.00)
GFR, Estimated: 15 mL/min — ABNORMAL LOW (ref >60)
Glucose, Bld: 134 mg/dL — ABNORMAL HIGH (ref 70–99)
Potassium: 4 mmol/L (ref 3.5–5.1)
Sodium: 136 mmol/L (ref 135–145)
Total Bilirubin: 0.4 mg/dL (ref 0.3–1.2)
Total Protein: 6.2 g/dL — ABNORMAL LOW (ref 6.5–8.1)

## 2020-12-05 LAB — PREPARE RBC (CROSSMATCH)

## 2020-12-05 MED ORDER — PROCHLORPERAZINE MALEATE 10 MG PO TABS: 10.0000 mg | ORAL_TABLET | Freq: Four times a day (QID) | ORAL | 2 refills | Status: AC | PRN

## 2020-12-05 NOTE — Op Note (Signed)
  Name: [name]  MRN: [id number]  Date: 12/05/2020   DOB: [birth year]  Stereotactic Radiosurgery Operative Note  PRE-OPERATIVE DIAGNOSIS:  Multiple Brain Metastases  POST-OPERATIVE DIAGNOSIS:  Multiple Brain Metastases  PROCEDURE:  Stereotactic Radiosurgery  SURGEON:  Charlie Pitter, MD  NARRATIVE: The patient underwent a radiation treatment planning session in the radiation oncology simulation suite under the care of the radiation oncology physician and physicist.  I participated closely in the radiation treatment planning afterwards. The patient underwent planning CT which was fused to 3T high resolution MRI with 1 mm axial slices.  These images were fused on the planning system.  We contoured the gross target volumes and subsequently expanded this to yield the Planning Target Volume. I actively participated in the planning process.  I helped to define and review the target contours and also the contours of the optic pathway, eyes, brainstem and selected nearby organs at risk.  All the dose constraints for critical structures were reviewed and compared to AAPM Task Group 101.  The prescription dose conformity was reviewed.  I approved the plan electronically.    Accordingly, Cassandra Dyer was brought to the TrueBeam stereotactic radiation treatment linac and placed in the custom immobilization mask.  The patient was aligned according to the IR fiducial markers with BrainLab Exactrac, then orthogonal x-rays were used in ExacTrac with the 6DOF robotic table and the shifts were made to align the patient  Cassandra Dyer received stereotactic radiosurgery uneventfully.    Lesions treated:  8   Complex lesions treated:  1 (>3.5 cm, <5mm of optic path, or within the brainstem)   The detailed description of the procedure is recorded in the radiation oncology procedure note.  I was present for the duration of the procedure.  DISPOSITION:  Following delivery, the patient was transported to  nursing in stable condition and monitored for possible acute effects to be discharged to home in stable condition with follow-up in one month.  Charlie Pitter, MD 12/05/2020 10:29 AM

## 2020-12-05 NOTE — Patient Instructions (Signed)
-  There are two main categories of lung cancer, they are named based on the size of the cancer cell. One is called Non-Small cell lung cancer. The other type is Small Cell Lung Cancer -The sample (biopsy) that they took of your tumor was consistent with a subtype of Non-small cell lung cancer called Adenocarcinoma -We covered a lot of important information at your appointment today regarding what the treatment plan is moving forward. Here are the the main points that were discussed at your office visit with [place] today:  -The treatment that you will receive consists of two chemotherapy drugs, called Carboplatin and Paclitaxel (also referred to as Taxol) and one immunotherapy drug called Keytruda (pembrolizumab).  -We are planning on starting your treatment next week on 12/18/19 but before your start your treatment, I would like you to attend a Chemotherapy Education Class. This involves having you sit down with one of our nurse educators. She will discuss with you one-on-one more details about your treatment as well as general information about resources here at the cancer center.  -Your treatment will be given once every 3 weeks. We will check your labs once a week for the first ~5 treatments just to make sure that important components of your blood are in an acceptable range -You will need to return 2 days after your treatment to receive an injection. This injection is important because it boosts your infection fighting cells in your body and helps protect you from getting an infection.  -We will get a CT scan after 3 treatments to check on the progress of treatment  Medications:  -Compazine was sent to your pharmacy. This medication is for nausea. You may take this every 6 hours as needed if you feel nausous.   Referrals or Imaging:  -Please call [phone] to schedule your PET scan -We will try to to arrange for blood either here or dialysis  Follow up:  -We will see you back for a follow up visit  in about  __ to see how your first treatment went and to make sure you are not having any side effects from treatment.   If you need to contact our office, please do not hesitate, we are here to help. Our number is [phone]. When you call, ask to speak to Cassie's or Dr. Worthy Flank nurse.

## 2020-12-05 NOTE — Progress Notes (Signed)
START OFF PATHWAY REGIMEN - Non-Small Cell Lung   [id number]:Carboplatin AUC=6 IV D1 + Paclitaxel 200 mg/m2 IV D1 + Pembrolizumab 200 mg IV D1 q21 Days x 4 Cycles:   A cycle is every 21 days:     Pembrolizumab      Paclitaxel      Carboplatin   **Always confirm dose/schedule in your pharmacy ordering system**  Patient Characteristics: Stage IV Metastatic, Nonsquamous, Initial Chemotherapy/Immunotherapy, PS = 0, 1, ALK Rearrangement Negative and ROS1 Rearrangement Negative and NTRK Gene Fusion?Negative and RET Gene Fusion?Negative and EGFR Mutation Negative, PD-L1 Expression Positive  1-49% (TPS) / Negative / Not Tested / Awaiting Test Results and Immunotherapy Candidate Therapeutic Status: Stage IV Metastatic Histology: Nonsquamous Cell ROS1 Rearrangement Status: Negative Other Mutations/Biomarkers: No Other Actionable Mutations Chemotherapy/Immunotherapy LOT: Initial Chemotherapy/Immunotherapy Molecular Targeted Therapy: Not Appropriate KRAS G12C Mutation Status: Negative MET Exon 14 Mutation Status: Negative RET Gene Fusion Status: Negative EGFR Mutation Status: Negative/Wild Type NTRK Gene Fusion Status: Negative PD-L1 Expression Status: PD-L1 Positive 1-49% (TPS) ALK Rearrangement Status: Negative BRAF V600E Mutation Status: Negative ECOG Performance Status: 1 Biomarker Assessment Status Confirmation: All Genomic Markers Negative, or Only MET+ or BRAF+ or KRAS G12C+ Immunotherapy Candidate Status: Candidate for Immunotherapy Intent of Therapy: Non-Curative / Palliative Intent, Discussed with Patient 

## 2020-12-05 NOTE — Patient Outreach (Signed)
[facility]) Care Management [facility] CM Telephone Outreach, new referral PCP office completes Transition of Care outreach post-hospital discharge Post-(most recent) hospital discharge day # 7 Unsuccessful (consecutive) outreach attempt # 2, new referral   12/05/2020  Cassandra Dyer [birth year] [id number]  Unsuccessful (consecutive) second outreach attempt [name], 79 y/o female originally referred to [place] 09/12/20 by [facility] Liaison after patient experienced2 recenthospitalizationsSeptember 27-30, 2021 for N/V/D with malaise and weakness;November 9-24, 2024for volume overload after patient missed established hemodialysis session. During most recent hospitalization, (L) hilar mass with pulmonary nodules concerning for malignancy discovered during this hospital visitand patient was diagnosed with lung cancer. She was discharged to home/ self-care with home health services recommended, patient refused.  Patient has history including, but not limited to, renal cell cancer with (R) nephrectomy 2018; ESRD- on hemodialysis since 07/2020; COPD- on home O2 at baseline at 2 L/min; HTN/ HLD.  Unfortunately, patient experienced third hospitalization December 8-16, 2021 for volume overload in setting of 2 missed outpatient hemodialysis sessions; patient could not attend due to pain in (R) hip; (R) femur fracture diagnosed and patient had surgical arthroplasty 11/22/20.  During most recent hospitalization patient was also diagnosed with metastases of recently diagnosed lung cancer, into her brain.  She was discharged home to self-care with home health services in place.   With today's call attempt, I was unable to leave HIPAA compliant voice mail message for patient, requesting return call back, as patient's phone rang without physical or voice mail pick up.  Plan:  Verified [facility] CM Unsuccessful outreach latter placed in mail, requesting call back in writing, on  11/29/20  Will re-attempt [facility] CM telephone outreach within 4 business days if I do not hear back from patient/ caregiver first  [name], RN, BSN, [facility]  [phone]

## 2020-12-05 NOTE — Progress Notes (Signed)
Ms. Parkey rested with [place] for 30 minutes following her [place] treatment.  Patient denies headache, dizziness, nausea, diplopia or ringing in the ears. Denies fatigue. Patient without complaints. Understands to avoid strenuous activity for the next 24 hours and call [phone] with needs.   BP (!) 183/70 (BP Location: Right Arm)   Pulse 72   Temp 97.9 F (36.6 C)   Resp 18   SpO2 100%   Dyann Goodspeed M. Leonie Green, BSN

## 2020-12-06 ENCOUNTER — Inpatient Hospital Stay: Payer: Medicare Other

## 2020-12-06 VITALS — BP 158/52 | HR 74 | Temp 98.7°F | Resp 18

## 2020-12-06 DIAGNOSIS — C3432 Malignant neoplasm of lower lobe, left bronchus or lung: Secondary | ICD-10-CM | POA: Diagnosis not present

## 2020-12-06 DIAGNOSIS — Z95828 Presence of other vascular implants and grafts: Secondary | ICD-10-CM

## 2020-12-06 DIAGNOSIS — C3492 Malignant neoplasm of unspecified part of left bronchus or lung: Secondary | ICD-10-CM

## 2020-12-06 DIAGNOSIS — R5383 Other fatigue: Secondary | ICD-10-CM | POA: Diagnosis not present

## 2020-12-06 DIAGNOSIS — N186 End stage renal disease: Secondary | ICD-10-CM | POA: Diagnosis not present

## 2020-12-06 DIAGNOSIS — C7931 Secondary malignant neoplasm of brain: Secondary | ICD-10-CM | POA: Diagnosis not present

## 2020-12-06 DIAGNOSIS — Z992 Dependence on renal dialysis: Secondary | ICD-10-CM | POA: Diagnosis not present

## 2020-12-06 DIAGNOSIS — R5381 Other malaise: Secondary | ICD-10-CM | POA: Diagnosis not present

## 2020-12-06 DIAGNOSIS — R531 Weakness: Secondary | ICD-10-CM | POA: Diagnosis not present

## 2020-12-06 DIAGNOSIS — R0600 Dyspnea, unspecified: Secondary | ICD-10-CM | POA: Diagnosis not present

## 2020-12-06 DIAGNOSIS — N2581 Secondary hyperparathyroidism of renal origin: Secondary | ICD-10-CM | POA: Diagnosis not present

## 2020-12-06 DIAGNOSIS — D631 Anemia in chronic kidney disease: Secondary | ICD-10-CM | POA: Diagnosis not present

## 2020-12-06 MED ORDER — SODIUM CHLORIDE 0.9% IV SOLUTION
250.0000 mL | Freq: Once | INTRAVENOUS | Status: AC
  Administered 2020-12-06: 09:00:00 250 mL via INTRAVENOUS
  Filled 2020-12-06: qty 250

## 2020-12-06 MED ORDER — SODIUM CHLORIDE 0.9% FLUSH
10.0000 mL | INTRAVENOUS | Status: DC | PRN
  Filled 2020-12-06: qty 10

## 2020-12-06 MED ORDER — ACETAMINOPHEN 325 MG PO TABS
ORAL_TABLET | ORAL | Status: AC
  Filled 2020-12-06: qty 2

## 2020-12-06 MED ORDER — SODIUM CHLORIDE 0.9% FLUSH
3.0000 mL | INTRAVENOUS | Status: DC | PRN
  Filled 2020-12-06: qty 10

## 2020-12-06 MED ORDER — DIPHENHYDRAMINE HCL 25 MG PO CAPS
25.0000 mg | ORAL_CAPSULE | Freq: Once | ORAL | Status: AC
  Administered 2020-12-06: 08:00:00 25 mg via ORAL

## 2020-12-06 MED ORDER — DIPHENHYDRAMINE HCL 25 MG PO CAPS
ORAL_CAPSULE | ORAL | Status: AC
  Filled 2020-12-06: qty 1

## 2020-12-06 MED ORDER — HEPARIN SOD (PORK) LOCK FLUSH 100 UNIT/ML IV SOLN
500.0000 [IU] | Freq: Once | INTRAVENOUS | Status: DC
  Filled 2020-12-06: qty 5

## 2020-12-06 MED ORDER — ACETAMINOPHEN 325 MG PO TABS
650.0000 mg | ORAL_TABLET | Freq: Once | ORAL | Status: AC
  Administered 2020-12-06: 08:00:00 650 mg via ORAL

## 2020-12-06 NOTE — Patient Instructions (Signed)

## 2020-12-07 LAB — TYPE AND SCREEN
ABO/RH(D): O POS
Antibody Screen: NEGATIVE
Unit division: 0

## 2020-12-07 LAB — BPAM RBC
Blood Product Expiration Date: 202201252359
ISSUE DATE / TIME: 202112240833
Unit Type and Rh: 5100

## 2020-12-09 ENCOUNTER — Telehealth: Payer: Self-pay | Admitting: Internal Medicine

## 2020-12-09 DIAGNOSIS — Z992 Dependence on renal dialysis: Secondary | ICD-10-CM | POA: Diagnosis not present

## 2020-12-09 DIAGNOSIS — D631 Anemia in chronic kidney disease: Secondary | ICD-10-CM | POA: Diagnosis not present

## 2020-12-09 DIAGNOSIS — N2581 Secondary hyperparathyroidism of renal origin: Secondary | ICD-10-CM | POA: Diagnosis not present

## 2020-12-09 DIAGNOSIS — N186 End stage renal disease: Secondary | ICD-10-CM | POA: Diagnosis not present

## 2020-12-09 NOTE — Telephone Encounter (Signed)
Scheduled appts per 12/23 los. Left voicemail with next appt date and time. Pt to get updated appt calendar at next visit per appt notes.

## 2020-12-10 ENCOUNTER — Inpatient Hospital Stay: Payer: Medicare Other

## 2020-12-10 ENCOUNTER — Other Ambulatory Visit: Payer: Self-pay

## 2020-12-10 ENCOUNTER — Ambulatory Visit
Admission: RE | Admit: 2020-12-10 | Discharge: 2020-12-10 | Disposition: A | Discharge status: Home / Self Care | Payer: Medicare Other | Source: Ambulatory Visit | Attending: Radiation Oncology | Admitting: Radiation Oncology

## 2020-12-10 ENCOUNTER — Encounter: Payer: Self-pay | Admitting: *Deleted

## 2020-12-10 DIAGNOSIS — R5381 Other malaise: Secondary | ICD-10-CM | POA: Diagnosis not present

## 2020-12-10 DIAGNOSIS — R531 Weakness: Secondary | ICD-10-CM | POA: Diagnosis not present

## 2020-12-10 DIAGNOSIS — C3432 Malignant neoplasm of lower lobe, left bronchus or lung: Secondary | ICD-10-CM | POA: Diagnosis not present

## 2020-12-10 DIAGNOSIS — C7931 Secondary malignant neoplasm of brain: Secondary | ICD-10-CM

## 2020-12-10 DIAGNOSIS — R5383 Other fatigue: Secondary | ICD-10-CM | POA: Diagnosis not present

## 2020-12-10 DIAGNOSIS — R0600 Dyspnea, unspecified: Secondary | ICD-10-CM | POA: Diagnosis not present

## 2020-12-10 DIAGNOSIS — C3492 Malignant neoplasm of unspecified part of left bronchus or lung: Secondary | ICD-10-CM

## 2020-12-10 DIAGNOSIS — Z510 Encounter for antineoplastic radiation therapy: Secondary | ICD-10-CM | POA: Diagnosis not present

## 2020-12-10 LAB — CBC WITH DIFFERENTIAL (CANCER CENTER ONLY)
Abs Immature Granulocytes: 0.66 10*3/uL — ABNORMAL HIGH (ref 0.00–0.07)
Basophils Absolute: 0.1 10*3/uL (ref 0.0–0.1)
Basophils Relative: 0 %
Eosinophils Absolute: 0 10*3/uL (ref 0.0–0.5)
Eosinophils Relative: 0 %
HCT: 28.7 % — ABNORMAL LOW (ref 36.0–46.0)
Hemoglobin: 9.2 g/dL — ABNORMAL LOW (ref 12.0–15.0)
Immature Granulocytes: 2 %
Lymphocytes Relative: 1 %
Lymphs Abs: 0.3 10*3/uL — ABNORMAL LOW (ref 0.7–4.0)
MCH: 28.3 pg (ref 26.0–34.0)
MCHC: 32.1 g/dL (ref 30.0–36.0)
MCV: 88.3 fL (ref 80.0–100.0)
Monocytes Absolute: 1.4 10*3/uL — ABNORMAL HIGH (ref 0.1–1.0)
Monocytes Relative: 4 %
Neutro Abs: 36.8 10*3/uL — ABNORMAL HIGH (ref 1.7–7.7)
Neutrophils Relative %: 93 %
Platelet Count: 225 10*3/uL (ref 150–400)
RBC: 3.25 MIL/uL — ABNORMAL LOW (ref 3.87–5.11)
RDW: 17.2 % — ABNORMAL HIGH (ref 11.5–15.5)
WBC Count: 39.3 10*3/uL — ABNORMAL HIGH (ref 4.0–10.5)
nRBC: 0 % (ref 0.0–0.2)

## 2020-12-10 LAB — SAMPLE TO BLOOD BANK

## 2020-12-10 LAB — CMP (CANCER CENTER ONLY)
ALT: 25 U/L (ref 0–44)
AST: 23 U/L (ref 15–41)
Albumin: 2.5 g/dL — ABNORMAL LOW (ref 3.5–5.0)
Alkaline Phosphatase: 184 U/L — ABNORMAL HIGH (ref 38–126)
Anion gap: 12 (ref 5–15)
BUN: 36 mg/dL — ABNORMAL HIGH (ref 8–23)
CO2: 30 mmol/L (ref 22–32)
Calcium: 9 mg/dL (ref 8.9–10.3)
Chloride: 95 mmol/L — ABNORMAL LOW (ref 98–111)
Creatinine: 3.51 mg/dL (ref 0.44–1.00)
GFR, Estimated: 13 mL/min — ABNORMAL LOW (ref >60)
Glucose, Bld: 154 mg/dL — ABNORMAL HIGH (ref 70–99)
Potassium: 3.8 mmol/L (ref 3.5–5.1)
Sodium: 137 mmol/L (ref 135–145)
Total Bilirubin: 0.6 mg/dL (ref 0.3–1.2)
Total Protein: 6.2 g/dL — ABNORMAL LOW (ref 6.5–8.1)

## 2020-12-10 LAB — TSH: TSH: 0.386 u[IU]/mL (ref 0.308–3.960)

## 2020-12-10 NOTE — Progress Notes (Signed)
Pt did not receive blood transfusion today because HGB results were 9.2 Per Dr. Julien Nordmann no transfusion necessary.Pt taken to chemo education today because she would not be able to make appointment for tomorrow because of dialysis.

## 2020-12-10 NOTE — Progress Notes (Signed)
I received a message today from Cassandra Butts RN to asked if I could talk to the patient. I came to her chemo ed appt and spoke to Cassandra Dyer.  She is not sure she is able to do her treatment plan that Dr. Julien Nordmann spoke to her about.  I listened as she explained.  I suggested her to talk with Dr. Julien Nordmann again to discuss her concerns further.  She wanted that. I scheduled her an appt this week to discuss further.  She was thankful for the help.

## 2020-12-11 ENCOUNTER — Inpatient Hospital Stay: Payer: Medicare Other

## 2020-12-11 ENCOUNTER — Encounter: Payer: Self-pay | Admitting: Internal Medicine

## 2020-12-11 ENCOUNTER — Telehealth: Payer: Self-pay | Admitting: Radiation Therapy

## 2020-12-11 DIAGNOSIS — Z992 Dependence on renal dialysis: Secondary | ICD-10-CM | POA: Diagnosis not present

## 2020-12-11 DIAGNOSIS — C7931 Secondary malignant neoplasm of brain: Secondary | ICD-10-CM | POA: Insufficient documentation

## 2020-12-11 DIAGNOSIS — N186 End stage renal disease: Secondary | ICD-10-CM | POA: Diagnosis not present

## 2020-12-11 DIAGNOSIS — D631 Anemia in chronic kidney disease: Secondary | ICD-10-CM | POA: Diagnosis not present

## 2020-12-11 DIAGNOSIS — N2581 Secondary hyperparathyroidism of renal origin: Secondary | ICD-10-CM | POA: Diagnosis not present

## 2020-12-11 NOTE — Telephone Encounter (Addendum)
The [employer] Trac system used for Cassandra Dyer's localization during the [place] procedure, is requiring a part replacement on 12/30. I left a voicemail on all the contact numbers in Cassandra Dyer's chart, to inform them we need to reschedule her treatment on 12/30 to Tuesday 1/4 instead.    Cassandra Dyer R.T.(R)(T) Radiation Special Procedures Navigator    Update 12/29 @ 3:52pm : Cassandra Dyer sister called back, she got my message and will have her here on Tuesday 1/4 for her final SRS fraction.

## 2020-12-11 NOTE — Progress Notes (Addendum)
Nurse monitoring following second of three intended [place] treatment complete. BP slightly elevated. Patient explains she hasn't taken her BP medication yet today. Patient denies headache, dizziness, or chest pain. Patient denies new onset neurologic symptoms. Oxygen therapy 2 liters via nasal cannula noted. Reports taking decadron 4 mg bid as directed. No evidence of thrush noted. Encouraged patient to not miss any doses of decadron. Instructed patient to avoid strenuous activity for the next 24 hours. Encouraged patient to phone [phone] and request the oncall radiation oncologist with needs related to today's treatment. Patient verbalized understanding of all reviewed. Patient wheeled to the lobby for discharge by Carlyon Shadow, [place]. No distress noted at discharge.

## 2020-12-11 NOTE — Addendum Note (Signed)
Encounter addended by: Heywood Footman, RN on: 12/11/2020 2:46 PM  Actions taken: Clinical Note Signed

## 2020-12-11 NOTE — Progress Notes (Signed)
Called pt to introduce myself as her Arboriculturist, discuss copay assistance and the [employer].  I left a msg requesting she return my call if she's interested in applying for the grants.

## 2020-12-11 NOTE — Progress Notes (Signed)
Pharmacist Chemotherapy Monitoring - Initial Assessment    Anticipated start date: 12/18/20   Regimen:  . Are orders appropriate based on the patient's diagnosis, regimen, and cycle? Yes . Does the plan date match the patient's scheduled date? Yes . Is the sequencing of drugs appropriate? Yes . Are the premedications appropriate for the patient's regimen? Yes . Prior Authorization for treatment is: Approved o If applicable, is the correct biosimilar selected based on the patient's insurance? yes  Organ Function and Labs: Marland Kitchen Are dose adjustments needed based on the patient's renal function, hepatic function, or hematologic function? No . Are appropriate labs ordered prior to the start of patient's treatment? Yes . Other organ system assessment, if indicated: N/A . The following baseline labs, if indicated, have been ordered: pembrolizumab: baseline TSH +/- T4  Dose Assessment: . Are the drug doses appropriate? Yes . Are the following correct: o Drug concentrations Yes o IV fluid compatible with drug Yes o Administration routes Yes o Timing of therapy Yes . If applicable, does the patient have documented access for treatment and/or plans for port-a-cath placement? no . If applicable, have lifetime cumulative doses been properly documented and assessed? yes Lifetime Dose Tracking  No doses have been documented on this patient for the following tracked chemicals: Doxorubicin, Epirubicin, Idarubicin, Daunorubicin, Mitoxantrone, Bleomycin, Oxaliplatin, Carboplatin, Liposomal Doxorubicin  o   Toxicity Monitoring/Prevention: . The patient has the following take home antiemetics prescribed: Prochlorperazine & Dexamethasone . The patient has the following take home medications prescribed: N/A . Medication allergies and previous infusion related reactions, if applicable, have been reviewed and addressed. Yes . The patient's current medication list has been assessed for drug-drug interactions  with their chemotherapy regimen. Pt has Rx for Dex; vasogenic edema -  significant drug-drug interactions were identified on review.  Order Review: . Are the treatment plan orders signed? Yes . Is the patient scheduled to see a provider prior to their treatment? Yes  I verify that I have reviewed each item in the above checklist and answered each question accordingly.  Kennith [facility] P 12/11/2020 2:03 PM

## 2020-12-12 ENCOUNTER — Encounter: Payer: Self-pay | Admitting: Internal Medicine

## 2020-12-12 ENCOUNTER — Inpatient Hospital Stay (HOSPITAL_BASED_OUTPATIENT_CLINIC_OR_DEPARTMENT_OTHER): Payer: Medicare Other | Admitting: Internal Medicine

## 2020-12-12 ENCOUNTER — Ambulatory Visit: Payer: Medicare Other | Admitting: Radiation Oncology

## 2020-12-12 ENCOUNTER — Other Ambulatory Visit: Payer: Self-pay

## 2020-12-12 ENCOUNTER — Other Ambulatory Visit: Payer: Self-pay | Admitting: *Deleted

## 2020-12-12 ENCOUNTER — Encounter: Payer: Self-pay | Admitting: *Deleted

## 2020-12-12 VITALS — BP 157/62 | HR 73 | Temp 98.6°F | Resp 18 | Ht 62.0 in | Wt 114.1 lb

## 2020-12-12 DIAGNOSIS — Z5111 Encounter for antineoplastic chemotherapy: Secondary | ICD-10-CM | POA: Diagnosis not present

## 2020-12-12 DIAGNOSIS — Z5112 Encounter for antineoplastic immunotherapy: Secondary | ICD-10-CM

## 2020-12-12 DIAGNOSIS — C3492 Malignant neoplasm of unspecified part of left bronchus or lung: Secondary | ICD-10-CM | POA: Diagnosis not present

## 2020-12-12 DIAGNOSIS — R5383 Other fatigue: Secondary | ICD-10-CM | POA: Diagnosis not present

## 2020-12-12 DIAGNOSIS — R531 Weakness: Secondary | ICD-10-CM | POA: Diagnosis not present

## 2020-12-12 DIAGNOSIS — I10 Essential (primary) hypertension: Secondary | ICD-10-CM

## 2020-12-12 DIAGNOSIS — D631 Anemia in chronic kidney disease: Secondary | ICD-10-CM

## 2020-12-12 DIAGNOSIS — N184 Chronic kidney disease, stage 4 (severe): Secondary | ICD-10-CM | POA: Diagnosis not present

## 2020-12-12 DIAGNOSIS — C3432 Malignant neoplasm of lower lobe, left bronchus or lung: Secondary | ICD-10-CM | POA: Diagnosis not present

## 2020-12-12 DIAGNOSIS — R5381 Other malaise: Secondary | ICD-10-CM | POA: Diagnosis not present

## 2020-12-12 DIAGNOSIS — C7931 Secondary malignant neoplasm of brain: Secondary | ICD-10-CM | POA: Diagnosis not present

## 2020-12-12 DIAGNOSIS — R0600 Dyspnea, unspecified: Secondary | ICD-10-CM | POA: Diagnosis not present

## 2020-12-12 NOTE — Progress Notes (Signed)
Cassandra Dyer [phone]   [phone]  OFFICE PROGRESS NOTE  Biagio Borg, MD [place] [id number]  DIAGNOSIS: stage IV (T3, N2, M1c) non-small cell lung cancer, adenocarcinoma presented with large left lower lobe lung mass in addition to left hilar and mediastinal lymphadenopathy as well as pleural-based metastasis in the left lung diagnosed in November 2021. She has metastatic disease to the brain. pending further staging work-up with PET scan.  DETECTED ALTERATION(S) / BIOMARKER(S)     % CFDNA OR AMPLIFICATION        ASSOCIATED FDA-APPROVED THERAPIES         CLINICAL TRIAL AVAILABILITY [id number] 16.5% None     Yes [id number] 0.8% None     Yes [id number] 15.3% None     Yes  PDL1 Expression: 25%  PRIOR THERAPY: None  CURRENT THERAPY: 1) SRS to the metastatic brain lesions under the care of Dr. MLisbeth Renshaw Last treatment expected on 12/12/20 2) palliative systemic chemotherapy with carboplatin for an AUC of 5, paclitaxel 175 mg/m, Keytruda 200 mg IV every 3 weeks with Neulasta support.  First dose expected on 12/17/2020.  INTERVAL HISTORY: Cassandra SAMPEDRO79y.o. female returns to the clinic today for follow-up visit accompanied by her daughter.  The patient and her daughter are still confused about the chemotherapy.  They did not call to schedule the PET scan as recommended.  She continues to have the baseline shortness of breath increased with exertion and she is on home oxygen.  She denied having any nausea, vomiting, diarrhea or constipation.  She denied having any headache or visual changes.  She has no chest pain, cough or hemoptysis.  She is expected to complete her brain radiotherapy next week.  The patient is here today for reevaluation and more discussion of her chemotherapy option.  MEDICAL HISTORY: Past Medical History:  Diagnosis Date  . Anemia   . [place]   . Hepatitis    hx of hep C -  10-20 years ago   . History of blood transfusion   . History of kidney cancer   . Hyperlipidemia   . Hypertension    not taken b/p med in 2-3 years md deceased never went to another md   . [place]  . [place] 4 [place]    new - due to start therapy with next appt on 12/14    ALLERGIES:  has No Known Allergies.  MEDICATIONS:  Current Outpatient Medications  Medication Sig Dispense Refill  . amLODipine (NORVASC) 10 MG tablet Take 1 tablet (10 mg total) by mouth daily. 90 tablet 3  . ascorbic acid (VITAMIN C) 500 MG tablet Take 500 mg by mouth 2 (two) times daily.     . Black Currant Seed Oil 500 MG CAPS Take 500 mg by mouth every morning.     . Calcium Carbonate-Vit D-Min (CALCIUM 1200 PO) Take 1,200 mg by mouth every morning.     . carvedilol (COREG) 12.5 MG tablet Take 1 tablet (12.5 mg total) by mouth 2 (two) times daily with a meal. 60 tablet 1  . Cholecalciferol (VITAMIN D3) 50 MCG (2000 UT) capsule Take 2,000 Units by mouth every morning.    .Marland Kitchendexamethasone (DECADRON) 4 MG tablet Take 1 tablet (4 mg total) by mouth every 8 (eight) hours. 90 tablet 0  . docusate sodium (COLACE) 100 MG capsule Take 1 capsule (100 mg total)  by mouth 2 (two) times daily. 10 capsule 0  . enoxaparin (LOVENOX) 30 MG/0.3ML injection Inject 0.3 mLs (30 mg total) into the skin daily for 14 days. 4.2 mL 0  . Garlic 4944 MG CAPS Take 2,000 mg by mouth daily.     . Multiple Vitamin (MULTIVITAMIN WITH MINERALS) TABS tablet Take 1 tablet by mouth every morning.     . Omega-3 Fatty Acids (FISH OIL PO) Take 1 capsule by mouth every morning.     . ondansetron (ZOFRAN) 4 MG tablet Take 1 tablet (4 mg total) by mouth every 8 (eight) hours as needed for nausea or vomiting. 30 tablet 1  . oxyCODONE-acetaminophen (PERCOCET) 5-325 MG tablet Take 1-2 tablets by mouth every 8 (eight) hours as needed for severe pain. 30 tablet 0  . pantoprazole (PROTONIX) 40 MG tablet Take 1 tablet (40 mg total)  by mouth daily. 30 tablet 0  . polyethylene glycol (MIRALAX / GLYCOLAX) 17 g packet Take 17 g by mouth daily as needed for mild constipation.    . prochlorperazine (COMPAZINE) 10 MG tablet Take 1 tablet (10 mg total) by mouth every 6 (six) hours as needed. 30 tablet 2  . rosuvastatin (CRESTOR) 10 MG tablet Take 1 tablet (10 mg total) by mouth daily. 90 tablet 3  . senna-docusate (SENOKOT-S) 8.6-50 MG tablet Take 1 tablet by mouth daily as needed for mild constipation.    Marland Kitchen telmisartan (MICARDIS) 80 MG tablet Take 80 mg by mouth daily.     No current facility-administered medications for this visit.    SURGICAL HISTORY:  Past Surgical History:  Procedure Laterality Date  . ANTERIOR APPROACH HEMI HIP ARTHROPLASTY Right 11/22/2020   Procedure: ANTERIOR APPROACH RIGHT HIP HEMIARTHROPLASTY;  Surgeon: Leandrew Koyanagi, MD;  Location: [facility];  Service: Orthopedics;  Laterality: Right;  Needs RNFA  . AV FISTULA PLACEMENT Left 08/07/2020   Procedure: LEFT ARM ARTERIOVENOUS (AV) FISTULA CREATION;  Surgeon: [name], MD;  Location: [facility] [facility];  Service: Vascular;  Laterality: Left;  . BRONCHIAL BIOPSY  11/05/2020   Procedure: BRONCHIAL BIOPSIES;  Surgeon: Collene Gobble, MD;  Location: [facility] ENDOSCOPY;  Service: Cardiopulmonary;;  . BRONCHIAL BRUSHINGS  11/05/2020   Procedure: BRONCHIAL BRUSHINGS;  Surgeon: Collene Gobble, MD;  Location: [place];  Service: Cardiopulmonary;;  . BRONCHIAL NEEDLE ASPIRATION BIOPSY  11/05/2020   Procedure: BRONCHIAL NEEDLE ASPIRATION BIOPSIES;  Surgeon: Collene Gobble, MD;  Location: [place];  Service: Cardiopulmonary;;  . CATARACT EXTRACTION Bilateral    11/30 left /12/9 right  . ELECTROMAGNETIC NAVIGATION BROCHOSCOPY N/A 11/05/2020   Procedure: ELECTROMAGNETIC NAVIGATION BRONCHOSCOPY;  Surgeon: Collene Gobble, MD;  Location: [place];  Service: Cardiopulmonary;  Laterality: N/A;  . ENDOBRONCHIAL ULTRASOUND N/A 11/05/2020   Procedure: ENDOBRONCHIAL  ULTRASOUND;  Surgeon: Collene Gobble, MD;  Location: [facility] ENDOSCOPY;  Service: Cardiopulmonary;  Laterality: N/A;  . FINE NEEDLE ASPIRATION  11/05/2020   Procedure: FINE NEEDLE ASPIRATION (FNA) LINEAR;  Surgeon: Collene Gobble, MD;  Location: [facility] [facility] ENDOSCOPY;  Service: Cardiopulmonary;;  . IR FLUORO GUIDE CV LINE RIGHT  07/27/2020  . IR THORACENTESIS ASP PLEURAL SPACE W/IMG GUIDE  10/24/2020  . IR THORACENTESIS ASP PLEURAL SPACE W/IMG GUIDE  10/25/2020  . IR THORACENTESIS ASP PLEURAL SPACE W/IMG GUIDE  10/31/2020  . IR US GUIDE VASC ACCESS RIGHT  07/27/2020  . ROBOT ASSISTED LAPAROSCOPIC NEPHRECTOMY Right 01/13/2017   Procedure: XI ROBOTIC ASSISTED LAPAROSCOPIC RADICAL NEPHRECTOMY WITH LYSIS OF ADHESION;  Surgeon: Alexis Frock, MD;  Location: [facility];  Service: Urology;  Laterality: Right;  Marland Kitchen VIDEO BRONCHOSCOPY N/A 11/05/2020   Procedure: VIDEO BRONCHOSCOPY WITH FLUORO;  Surgeon: Collene Gobble, MD;  Location: [facility] ENDOSCOPY;  Service: Cardiopulmonary;  Laterality: N/A;    REVIEW OF SYSTEMS:  Constitutional: positive for fatigue Eyes: negative Ears, nose, mouth, throat, and face: negative Respiratory: positive for dyspnea on exertion Cardiovascular: negative Gastrointestinal: negative Genitourinary:negative Integument/breast: negative Hematologic/lymphatic: negative Musculoskeletal:positive for muscle weakness Neurological: negative Behavioral/Psych: negative Endocrine: negative Allergic/Immunologic: negative   PHYSICAL EXAMINATION: General appearance: alert, cooperative, fatigued and no distress Head: Normocephalic, without obvious abnormality, atraumatic Neck: no adenopathy, no JVD, supple, symmetrical, trachea midline and thyroid not enlarged, symmetric, no tenderness/mass/nodules Lymph nodes: Cervical, supraclavicular, and axillary nodes normal. Resp: clear to auscultation bilaterally Back: symmetric, no curvature. ROM normal. No CVA tenderness. Cardio: regular rate and rhythm, S1,  S2 normal, no murmur, click, rub or gallop GI: soft, non-tender; bowel sounds normal; no masses,  no organomegaly Extremities: extremities normal, atraumatic, no cyanosis or edema Neurologic: Alert and oriented X 3, normal strength and tone. Normal symmetric reflexes. Normal coordination and gait  ECOG PERFORMANCE STATUS: 1 - Symptomatic but completely ambulatory  Blood pressure (!) 157/62, pulse 73, temperature 98.6 F (37 C), temperature source Tympanic, resp. rate 18, height 5' 2" (1.575 m), weight 114 lb 1.6 oz (51.8 kg), SpO2 100 %.  LABORATORY DATA: Lab Results  Component Value Date   WBC 39.3 (H) 12/10/2020   HGB 9.2 (L) 12/10/2020   HCT 28.7 (L) 12/10/2020   MCV 88.3 12/10/2020   PLT 225 12/10/2020      Chemistry      Component Value Date/Time   NA 137 12/10/2020 0808   NA 137 02/18/2015 0000   K 3.8 12/10/2020 0808   CL 95 (L) 12/10/2020 0808   CO2 30 12/10/2020 0808   BUN 36 (H) 12/10/2020 0808   BUN 15 02/18/2015 0000   CREATININE 3.51 (HH) 12/10/2020 0808   GLU 122 02/18/2015 0000      Component Value Date/Time   CALCIUM 9.0 12/10/2020 0808   CALCIUM 8.1 (L) 08/06/2020 0059   ALKPHOS 184 (H) 12/10/2020 0808   AST 23 12/10/2020 0808   ALT 25 12/10/2020 0808   BILITOT 0.6 12/10/2020 0808       RADIOGRAPHIC STUDIES: DG Chest 2 View  Result Date: 11/20/2020 CLINICAL DATA:  cp EXAM: CHEST - 2 VIEW COMPARISON:  11/18/2020 FINDINGS: Tunneled right IJ CVC tip overlies the superior cavoatrial junction. Cardiomegaly with central pulmonary vascular congestion. Redemonstration of small bilateral pleural effusions. New patchy bilateral pulmonary opacities. Multilevel spondylosis. IMPRESSION: 1. Cardiomegaly with central pulmonary vascular congestion. Redemonstration of small bilateral pleural effusions. 2. New confluent/patchy bilateral pulmonary opacities. Differential includes edema versus infection. Electronically Signed   By: Primitivo Gauze M.D.   On: 11/20/2020  13:29   MR BRAIN WO CONTRAST  Result Date: 11/24/2020 CLINICAL DATA:  79 year old female with cerebral metastatic disease. Postoperative day 2 ORIF for pathologic right femoral fracture. EXAM: MRI HEAD WITHOUT CONTRAST TECHNIQUE: Multiplanar, multiecho pulse sequences of the brain and surrounding structures were obtained without intravenous contrast. COMPARISON:  Brain MRI without contrast 01/23/2020. FINDINGS: Brain: Confluent vasogenic edema in the posterior left frontal and superior left parietal lobes surrounding a lobulated 14 mm presumed isointense cerebral metastasis is stable from 11/21/2020. No significant regional mass effect. Other predominantly T2 dark, FLAIR heterogeneous brain masses appear stable, some with associated hemosiderin as before (medial left occipital lobe). And additional lesions are  possible given the absence of IV contrast on both of these exams. However, one lesion of the anterior left frontal lobe has substantially regressed in the short-term based on T2 and FLAIR imaging (series 6, image 21) with resolved adjacent edema. No new areas of vasogenic edema. No significant intracranial mass effect. No superimposed restricted diffusion to suggest acute infarction. No ventriculomegaly, extra-axial collection or acute intracranial hemorrhage. Cervicomedullary junction and pituitary are within normal limits. Vascular: Major intracranial vascular flow voids are stable. Dominant right vertebral artery again suspected. Skull and upper cervical spine: Visualized bone marrow signal is within normal limits. Grossly normal visible cervical spine. Sinuses/Orbits: Stable, negative. Other: Mastoids remain clear. Grossly normal visible internal auditory structures. IMPRESSION: 1. Stable metastatic disease related cerebral edema since the noncontrast scan on 11/21/2020. No new or progressive lesions are evident in the absence of contrast. Postcontrast imaging likely would identify additional small  metastases. 2. Regression of a single small anterior left frontal lobe lesion since 11/21/2020 with largely resolved edema there. 3. No new intracranial abnormality. Electronically Signed   By: Genevie Ann M.D.   On: 11/24/2020 15:53   MR BRAIN WO CONTRAST  Result Date: 11/21/2020 CLINICAL DATA:  Metastatic lung cancer, history of renal cell carcinoma EXAM: MRI HEAD WITHOUT CONTRAST TECHNIQUE: Multiplanar, multiecho pulse sequences of the brain and surrounding structures were obtained without intravenous contrast. COMPARISON:  None. FINDINGS: Brain: Suboptimal evaluation for metastatic disease without intravenous contrast. Several lesions are identified and annotated primarily on axial T2 series 6: 1.4 x 0.8 cm lesion of the left postcentral gyrus with moderate surrounding edema on series 6, image 20; 0.5 cm lesion of the left superior frontal gyrus on series 6, image 22; 0.7 cm lesion of the left superior frontal gyrus more inferiorly with minimal edema on series 6, image 18; 2.6 x 1.7 cm lesion of the right lateral ventricle along the septum pellucidum on series 6, image 15; 0.5 cm lesion of the parasagittal left occipital lobe on series 6, image 13 with corresponding susceptibility reflecting chronic blood products or mineralization; 1 cm lesion of lateral left cerebellum with mild surrounding edema on series 7, image 6; Question of a small area of edema versus gliosis involving the lateral left precentral gyrus on series 7, image 16 without definite lesion. There is no acute infarction or intracranial hemorrhage. There is no hydrocephalus or extra-axial fluid collection. Prominence of the ventricles and sulci reflects minor generalized parenchymal volume loss. Vascular: Major vessel flow voids at the skull base are preserved. Skull and upper cervical spine: Normal marrow signal is preserved. Sinuses/Orbits: Trace mucosal thickening. Bilateral lens replacements. Other: Sella is unremarkable.  Mastoid air cells  are clear. IMPRESSION: Six intracranial lesions are identified likely reflecting metastases. There is edema associated with the larger lesions but no significant mass effect. Question of a small area of edema versus gliosis in the posterior left frontal lobe without definite underlying lesion. Electronically Signed   By: Macy Mis M.D.   On: 11/21/2020 13:04   MR BRAIN W WO CONTRAST  Result Date: 11/26/2020 CLINICAL DATA:  Metastatic lung cancer, abnormal noncontrast brain MRI EXAM: MRI HEAD WITHOUT AND WITH CONTRAST TECHNIQUE: Multiplanar, multiecho pulse sequences of the brain and surrounding structures were obtained without and with intravenous contrast. CONTRAST:  25m GADAVIST GADOBUTROL 1 MMOL/ML IV SOLN COMPARISON:  Multiple recent noncontrast studies FINDINGS: Brain: Multiple lesions are again identified and are annotated on series 1100. There has been no change in size since recent prior imaging.  A total of 8 lesions are identified on images 111, 146, 160, 177, 205, 217, 224, and 229. In comparison to the noncontrast imaging, this reflects identification of an additional left frontal and a right thalamic metastasis. The left occipital lesion demonstrates intrinsic T1 shortening and corresponding susceptibility and could conceivably reflect a cavernous malformation but given associated edema, metastasis is more likely. Stable associated edema. No significant mass effect. Incidental note is made of a left frontal developmental venous anomaly with adjacent gliosis. No acute infarction hemorrhage. Vascular: Major vessel flow voids at the skull base are preserved. Skull and upper cervical spine: No new findings. Sinuses/Orbits: No new findings. Other: Sella is unremarkable.  Mastoid air cells are clear. IMPRESSION: 8 metastatic lesions with stable edema. Left frontal and right thalamic lesions identified in addition to those on prior noncontrast imaging. Electronically Signed   By: Macy Mis M.D.    On: 11/26/2020 13:29   DG Chest Bilateral Decubitus  Result Date: 11/18/2020 CLINICAL DATA:  79 year old female with history of pleural effusions and thoracentesis, chest CT last month revealing left hilar mass. Shortness of breath. New right hip pain. EXAM: CHEST - BILATERAL DECUBITUS VIEW COMPARISON:  Portable chest 11/05/2020. Chest CTA 11/02/2020, and earlier. FINDINGS: Bilateral lateral decubitus views of the chest are provided. Both sides demonstrate layering pleural fluid, fairly symmetric with fluid to about 1.5 cm thickness on both sides. This is similar to the CTA chest 11/02/2020. The right side non dependent costophrenic angle clears better than the left. Grossly stable mediastinal contours, and right chest dual lumen dialysis type catheter remains in place. IMPRESSION: Fairly symmetric bilateral layering pleural effusions, fluid volume likely not significantly changed from the CTA 11/02/2020. Electronically Signed   By: Genevie Ann M.D.   On: 11/18/2020 16:38   Pelvis Portable  Result Date: 11/22/2020 CLINICAL DATA:  79 year old female status post hip replacement following pathologic fracture secondary to metastatic cancer. EXAM: PORTABLE PELVIS 1-2 VIEWS COMPARISON:  Intraoperative images 1359 hours today. FINDINGS: Portable AP view at 1552 hours. Bipolar right hip arthroplasty. Hardware appears intact with normal AP alignment. No unexpected osseous changes. Regional postoperative soft tissue gas. Lytic lesion in the superior left femoral neck redemonstrated. Small volume retained oral contrast in the descending colon. Calcified uterine fibroid. IMPRESSION: 1. Bipolar right hip arthroplasty with no adverse features. 2. Proximal left femoral lytic metastasis. Electronically Signed   By: Genevie Ann M.D.   On: 11/22/2020 16:47   MR FEMUR RIGHT W WO CONTRAST  Result Date: 11/20/2020 CLINICAL DATA:  Fracture with metastatic disease EXAM: MRI OF THE RIGHT FEMUR WITHOUT AND WITH CONTRAST TECHNIQUE:  Multiplanar, multisequence MR imaging of the right was performed both before and after administration of intravenous contrast. CONTRAST:  8m GADAVIST GADOBUTROL 1 MMOL/ML IV SOLN COMPARISON:  None. FINDINGS: Bones/Joint/Cartilage There is a nondisplaced slightly impacted subcapital right femoral neck fracture as on the recent exam. There is a heterogeneous T1 isointense/heterogeneously T2 bright peripherally enhancing lesion within the medial right femoral neck. Mild surrounding marrow edema seen. No other fracture is identified. There is moderate bilateral hip osteoarthritis. Within the left femoral head/neck there is also a T1 hypointense/T2 heterogeneous lesion. A moderate right hip joint effusion is seen. Ligaments The ligamentum teres appears to be grossly intact Muscles and Tendons There is edema seen within the adductor musculature and iliopsoas musculature. The tendons appear to be grossly intact. Soft tissues Soft tissue swelling seen along the lateral aspect of the hip. Multiple hypointense uterine fibroids are seen.  There is cystic lesion seen within the left adnexa which is partially visualized. IMPRESSION: Nondisplaced slightly impacted subcapital right femoral neck pathologic fracture. There is a osseous lesion within the right femoral neck, likely metastatic lesion . Would recommend nuclear medicine bone scan for further evaluation other osseous lesions. Osseous lesion within the left femoral head/neck concerning for metastatic lesion. Moderate right hip osteoarthritis and joint effusion Electronically Signed   By: Prudencio Pair M.D.   On: 11/20/2020 20:18   DG C-Arm 1-60 Min  Result Date: 11/22/2020 CLINICAL DATA:  Right hip hemiarthroplasty. EXAM: DG C-ARM 1-60 MIN; OPERATIVE RIGHT HIP WITH PELVIS FLUOROSCOPY TIME:  Fluoroscopy Time:  6.9 seconds COMPARISON:  November 18, 2020. FINDINGS: Four C-arm fluoroscopic images were obtained intraoperatively and submitted for post operative  interpretation. These images demonstrate surgical changes of right hip arthroplasty. Please see the performing provider's procedural report for further detail. IMPRESSION: Intraoperative fluoroscopic images, as detailed above. Electronically Signed   By: Margaretha Sheffield MD   On: 11/22/2020 15:17   DG Hip Unilat W or W/O Pelvis 1 View Right  Addendum Date: 11/18/2020   ADDENDUM REPORT: 11/18/2020 16:53 ADDENDUM: Study discussed by telephone with PA-C VAN TANNER on 11/18/2020 at 1645 hours. Electronically Signed   By: Genevie Ann M.D.   On: 11/18/2020 16:53   Result Date: 11/18/2020 CLINICAL DATA:  79 year old female with history of pleural effusions and thoracentesis, chest CT last month revealing left hilar mass. Shortness of breath. New right hip pain. EXAM: DG HIP (WITH OR WITHOUT PELVIS) 1V RIGHT COMPARISON:  CT Abdomen and Pelvis 04/08/2020. FINDINGS: There is a minimally displaced right femoral neck fracture. The fracture appears to extend from the superior mid neck toward the inferior subcapital neck region. The right femoral head remains normally located. The right femur intertrochanteric segment and visible proximal femoral shaft appear intact. Bone mineralization about the pelvis and proximal femurs does not appear significantly changed from the April CT. No destructive osseous lesion identified radiographically. Pelvis and proximal left femur appear intact. Chronic calcified uterine fibroid again noted. Negative visible other lower abdominal and pelvic visceral contours. IMPRESSION: 1. Minimally displaced right femoral neck fracture, suspicious for pathologic fracture in this setting. 2. MRI versus whole-body bone scan would probably be most sensitive for detection of osseous metastatic disease. Electronically Signed: By: Genevie Ann M.D. On: 11/18/2020 16:41   DG HIP OPERATIVE UNILAT WITH PELVIS RIGHT  Result Date: 11/22/2020 CLINICAL DATA:  Right hip hemiarthroplasty. EXAM: DG C-ARM 1-60 MIN;  OPERATIVE RIGHT HIP WITH PELVIS FLUOROSCOPY TIME:  Fluoroscopy Time:  6.9 seconds COMPARISON:  November 18, 2020. FINDINGS: Four C-arm fluoroscopic images were obtained intraoperatively and submitted for post operative interpretation. These images demonstrate surgical changes of right hip arthroplasty. Please see the performing provider's procedural report for further detail. IMPRESSION: Intraoperative fluoroscopic images, as detailed above. Electronically Signed   By: Margaretha Sheffield MD   On: 11/22/2020 15:17    ASSESSMENT AND PLAN: This is a very pleasant 79 years old African-American female diagnosed with a stage IV non-small cell lung cancer, adenocarcinoma with large left lower lobe lung mass in addition to left hilar and mediastinal lymphadenopathy as well as pleural-based metastasis in the left lung diagnosed in November 2021 and multiple brain metastasis. Molecular study showed no actionable mutations and PD-L1 expression was 25%. The patient is currently undergoing SRS to the multiple brain metastasis under the care of Dr. Lisbeth Renshaw. I had a lengthy discussion with the patient today again about her  current condition and treatment plan. I gave her the option of palliative care and hospice referral versus proceeding with systemic chemotherapy with carboplatin for AUC of 5, paclitaxel 175 mg/M2 and Keytruda 200 mg IV every 3 weeks with Neulasta support. The patient is not a good candidate for treatment with pemetrexed because of her end-stage renal disease. I discussed the adverse effect of this treatment again with the patient and her daughter including but not limited to alopecia, myelosuppression, nausea and vomiting, peripheral neuropathy, liver or renal dysfunction as well as immunotherapy adverse effects. She is expected to start the first cycle of this treatment next week. For the end-stage renal disease she is currently on hemodialysis on Monday, Wednesday and Friday.  Her chemotherapy need to  be done on one of the other days. I also strongly recommend for the patient and her daughter to call the radiology department to schedule the PET scan which was missed. The patient will come back for follow-up visit in 2 weeks for evaluation and management of any adverse effect of her treatment. The patient voices understanding of current disease status and treatment options and is in agreement with the current care plan.  All questions were answered. The patient knows to call the clinic with any problems, questions or concerns. We can certainly see the patient much sooner if necessary.  The total time spent in the appointment was 36 minutes.  Disclaimer: This note was dictated with voice recognition software. Similar sounding words can inadvertently be transcribed and may not be corrected upon review.

## 2020-12-12 NOTE — Progress Notes (Signed)
Called pt on 12/11/20 to introduce myself as her Arboriculturist and to discuss copay assistance.  I left a msg requesting she return my call.  Since pt didn't return my call I met w/ her and introduced myself as her Arboriculturist and discussed copay assistance.  Pt would like to apply but she doesn't know her yearly income so she will call me with that info.  Once received I will complete the online application for the [place] and see if she qualifies for the [employer].  Pt has my card to contact me.

## 2020-12-12 NOTE — Patient Outreach (Signed)
[facility]) Care Management [facility] CM Telephone Outreach, new referral/ case closure- patient declined participation PCP office completes Transition of Care follow up post-hospital discharge Post-hospital discharge day # 14 without hospital re-admission  12/12/2020  Cassandra Dyer 05-05-1941 [id number]  Successful(consecutive) third outreach attempt Cassandra Dyer, 79 y/o femaleoriginallyreferred to [facility] RN CM 09/12/20 by [facility] Liaison after patient experienced2 recenthospitalizationsSeptember 27-30, 2021 for N/V/D with malaise and weakness;November 9-24, 2037for volume overload after patient missed established hemodialysis session. During most recent hospitalization, (L) hilar mass with pulmonary nodules concerning for malignancy discovered during this hospital visitand patient was diagnosed with lung cancer. She was discharged to home/ self-care with home health services recommended, patient refused.  Patient has history including, but not limited to, renal cell cancer with (R) nephrectomy 2018; ESRD- on hemodialysis since 07/2020; COPD- on home O2 at baseline at 2 L/min; HTN/ HLD.  Unfortunately, patient experienced third hospitalization December 8-16, 2015for volume overload in setting of 2 missed outpatient hemodialysis sessions; patient could not attend due to pain in (R) hip; (R) femur fracture diagnosed and patient had surgical arthroplasty 11/22/20. During most recent hospitalization patient was also diagnosed with metastases of recently diagnosed lung cancer into brain. She was discharged home to self-care with home health services in place.   HIPAA/ identity verified and purpose of call/ [facility] CM services discussed with patient, who has previously participated in [place] program; today, patient reports that she is unable to commit to active/ ongoing follow up from [place];  She states that she has appointments every day and confirms that she is not  missing hemodialysis sessions.  Reports had oncology provider appointment this morning and is tired and ready to rest.  She confirms that she is followed closely by oncology nurse navigator who assists as needed with care coordination needs.  Basic screening completed; patient verbalizes no ongoing unmet care coordination/ disease management/ pharmacy/ community resource needs, stating "cancer center is taking care of everything."  Patient denies further issues, concerns, or problems today.  I confirmed that patient has the main [facility] CM office phone number, and the [facility] CM 24-hour nurse advice phone number should issues arise and she wish to resume participation [place] program.  Explained that my role would be changing but that the staff at [facility] could assist if she wishes to resume participation in [place] program.  Plan:  Will make patient inactive with [facility] CM as she has declined ongoing [facility] CM participation and will make patient's PCP aware of same.  [name], RN, BSN, [employer] [facility] Care Management  [phone]

## 2020-12-13 DIAGNOSIS — N2581 Secondary hyperparathyroidism of renal origin: Secondary | ICD-10-CM | POA: Diagnosis not present

## 2020-12-13 DIAGNOSIS — N186 End stage renal disease: Secondary | ICD-10-CM | POA: Diagnosis not present

## 2020-12-13 DIAGNOSIS — Z992 Dependence on renal dialysis: Secondary | ICD-10-CM | POA: Diagnosis not present

## 2020-12-13 DIAGNOSIS — D631 Anemia in chronic kidney disease: Secondary | ICD-10-CM | POA: Diagnosis not present

## 2020-12-16 ENCOUNTER — Telehealth: Payer: Self-pay | Admitting: Internal Medicine

## 2020-12-16 NOTE — Telephone Encounter (Signed)
Appointments have been rescheduled, called patient regarding upcoming appointments such as 01/06. Left a voicemail.

## 2020-12-17 ENCOUNTER — Emergency Department (HOSPITAL_COMMUNITY): Payer: Medicare Other

## 2020-12-17 ENCOUNTER — Inpatient Hospital Stay (HOSPITAL_COMMUNITY)
Admission: EM | Admit: 2020-12-17 | Discharge: 2020-12-24 | DRG: 100 | Disposition: A | Discharge status: Skilled Nursing Facility | Payer: Medicare Other | Attending: Internal Medicine | Admitting: Internal Medicine

## 2020-12-17 ENCOUNTER — Encounter (HOSPITAL_COMMUNITY): Payer: Self-pay

## 2020-12-17 ENCOUNTER — Other Ambulatory Visit: Payer: Self-pay

## 2020-12-17 ENCOUNTER — Ambulatory Visit: Payer: Medicare Other | Admitting: Radiation Oncology

## 2020-12-17 DIAGNOSIS — E871 Hypo-osmolality and hyponatremia: Secondary | ICD-10-CM

## 2020-12-17 DIAGNOSIS — Z20822 Contact with and (suspected) exposure to covid-19: Secondary | ICD-10-CM | POA: Diagnosis present

## 2020-12-17 DIAGNOSIS — M898X9 Other specified disorders of bone, unspecified site: Secondary | ICD-10-CM | POA: Diagnosis present

## 2020-12-17 DIAGNOSIS — Z8249 Family history of ischemic heart disease and other diseases of the circulatory system: Secondary | ICD-10-CM

## 2020-12-17 DIAGNOSIS — L89312 Pressure ulcer of right buttock, stage 2: Secondary | ICD-10-CM | POA: Diagnosis present

## 2020-12-17 DIAGNOSIS — I10 Essential (primary) hypertension: Secondary | ICD-10-CM | POA: Diagnosis present

## 2020-12-17 DIAGNOSIS — Z992 Dependence on renal dialysis: Secondary | ICD-10-CM

## 2020-12-17 DIAGNOSIS — T380X5A Adverse effect of glucocorticoids and synthetic analogues, initial encounter: Secondary | ICD-10-CM | POA: Diagnosis present

## 2020-12-17 DIAGNOSIS — E785 Hyperlipidemia, unspecified: Secondary | ICD-10-CM | POA: Diagnosis present

## 2020-12-17 DIAGNOSIS — E875 Hyperkalemia: Secondary | ICD-10-CM

## 2020-12-17 DIAGNOSIS — R569 Unspecified convulsions: Secondary | ICD-10-CM | POA: Diagnosis not present

## 2020-12-17 DIAGNOSIS — D72829 Elevated white blood cell count, unspecified: Secondary | ICD-10-CM | POA: Diagnosis present

## 2020-12-17 DIAGNOSIS — Z66 Do not resuscitate: Secondary | ICD-10-CM | POA: Diagnosis present

## 2020-12-17 DIAGNOSIS — D649 Anemia, unspecified: Secondary | ICD-10-CM | POA: Diagnosis present

## 2020-12-17 DIAGNOSIS — D631 Anemia in chronic kidney disease: Secondary | ICD-10-CM | POA: Diagnosis present

## 2020-12-17 DIAGNOSIS — I120 Hypertensive chronic kidney disease with stage 5 chronic kidney disease or end stage renal disease: Secondary | ICD-10-CM | POA: Diagnosis present

## 2020-12-17 DIAGNOSIS — Z96641 Presence of right artificial hip joint: Secondary | ICD-10-CM | POA: Diagnosis present

## 2020-12-17 DIAGNOSIS — N2581 Secondary hyperparathyroidism of renal origin: Secondary | ICD-10-CM | POA: Diagnosis present

## 2020-12-17 DIAGNOSIS — C7931 Secondary malignant neoplasm of brain: Secondary | ICD-10-CM | POA: Diagnosis present

## 2020-12-17 DIAGNOSIS — N186 End stage renal disease: Secondary | ICD-10-CM | POA: Diagnosis present

## 2020-12-17 DIAGNOSIS — E872 Acidosis: Secondary | ICD-10-CM | POA: Diagnosis present

## 2020-12-17 DIAGNOSIS — C3492 Malignant neoplasm of unspecified part of left bronchus or lung: Secondary | ICD-10-CM | POA: Diagnosis present

## 2020-12-17 DIAGNOSIS — Z803 Family history of malignant neoplasm of breast: Secondary | ICD-10-CM

## 2020-12-17 DIAGNOSIS — L89322 Pressure ulcer of left buttock, stage 2: Secondary | ICD-10-CM | POA: Diagnosis present

## 2020-12-17 DIAGNOSIS — Z800 Family history of malignant neoplasm of digestive organs: Secondary | ICD-10-CM

## 2020-12-17 DIAGNOSIS — Z87891 Personal history of nicotine dependence: Secondary | ICD-10-CM

## 2020-12-17 DIAGNOSIS — L8990 Pressure ulcer of unspecified site, unspecified stage: Secondary | ICD-10-CM | POA: Insufficient documentation

## 2020-12-17 DIAGNOSIS — R29810 Facial weakness: Secondary | ICD-10-CM | POA: Diagnosis present

## 2020-12-17 DIAGNOSIS — G936 Cerebral edema: Secondary | ICD-10-CM | POA: Diagnosis present

## 2020-12-17 DIAGNOSIS — Z85528 Personal history of other malignant neoplasm of kidney: Secondary | ICD-10-CM

## 2020-12-17 DIAGNOSIS — E8770 Fluid overload, unspecified: Secondary | ICD-10-CM | POA: Diagnosis present

## 2020-12-17 LAB — CBC
HCT: 31.7 % — ABNORMAL LOW (ref 36.0–46.0)
Hemoglobin: 10 g/dL — ABNORMAL LOW (ref 12.0–15.0)
MCH: 28.7 pg (ref 26.0–34.0)
MCHC: 31.5 g/dL (ref 30.0–36.0)
MCV: 90.8 fL (ref 80.0–100.0)
Platelets: 197 10*3/uL (ref 150–400)
RBC: 3.49 MIL/uL — ABNORMAL LOW (ref 3.87–5.11)
RDW: 17.4 % — ABNORMAL HIGH (ref 11.5–15.5)
WBC: 56.2 10*3/uL (ref 4.0–10.5)
nRBC: 0 % (ref 0.0–0.2)

## 2020-12-17 LAB — BASIC METABOLIC PANEL
Anion gap: 22 — ABNORMAL HIGH (ref 5–15)
BUN: 123 mg/dL — ABNORMAL HIGH (ref 8–23)
CO2: 19 mmol/L — ABNORMAL LOW (ref 22–32)
Calcium: 8.3 mg/dL — ABNORMAL LOW (ref 8.9–10.3)
Chloride: 88 mmol/L — ABNORMAL LOW (ref 98–111)
Creatinine, Ser: 6.93 mg/dL — ABNORMAL HIGH (ref 0.44–1.00)
GFR, Estimated: 6 mL/min — ABNORMAL LOW (ref >60)
Glucose, Bld: 139 mg/dL — ABNORMAL HIGH (ref 70–99)
Potassium: 5.7 mmol/L — ABNORMAL HIGH (ref 3.5–5.1)
Sodium: 129 mmol/L — ABNORMAL LOW (ref 135–145)

## 2020-12-17 LAB — CBG MONITORING, ED: Glucose-Capillary: 158 mg/dL — ABNORMAL HIGH (ref 70–99)

## 2020-12-17 MED ORDER — SODIUM CHLORIDE 0.9 % IV SOLN
250.0000 mg | INTRAVENOUS | Status: DC
  Filled 2020-12-17: qty 2.5

## 2020-12-17 MED ORDER — AMLODIPINE BESYLATE 10 MG PO TABS
10.0000 mg | ORAL_TABLET | Freq: Every day | ORAL | Status: DC
  Administered 2020-12-18 – 2020-12-23 (×6): 10 mg via ORAL
  Filled 2020-12-17 (×5): qty 1
  Filled 2020-12-17: qty 2

## 2020-12-17 MED ORDER — PROCHLORPERAZINE MALEATE 10 MG PO TABS
10.0000 mg | ORAL_TABLET | Freq: Four times a day (QID) | ORAL | Status: DC | PRN
  Filled 2020-12-17: qty 1

## 2020-12-17 MED ORDER — ONDANSETRON HCL 4 MG PO TABS: 4.0000 mg | ORAL_TABLET | Freq: Three times a day (TID) | ORAL | Status: DC | PRN

## 2020-12-17 MED ORDER — DEXAMETHASONE 4 MG PO TABS
4.0000 mg | ORAL_TABLET | Freq: Three times a day (TID) | ORAL | Status: DC
  Administered 2020-12-18 – 2020-12-23 (×17): 4 mg via ORAL
  Filled 2020-12-17 (×20): qty 1

## 2020-12-17 MED ORDER — HEPARIN SODIUM (PORCINE) 5000 UNIT/ML IJ SOLN
5000.0000 [IU] | Freq: Three times a day (TID) | INTRAMUSCULAR | Status: DC
  Administered 2020-12-18 – 2020-12-23 (×14): 5000 [IU] via SUBCUTANEOUS
  Filled 2020-12-17 (×15): qty 1

## 2020-12-17 MED ORDER — ROSUVASTATIN CALCIUM 5 MG PO TABS
10.0000 mg | ORAL_TABLET | Freq: Every day | ORAL | Status: DC
  Administered 2020-12-18 – 2020-12-23 (×6): 10 mg via ORAL
  Filled 2020-12-17 (×7): qty 2

## 2020-12-17 MED ORDER — DOCUSATE SODIUM 100 MG PO CAPS
100.0000 mg | ORAL_CAPSULE | Freq: Two times a day (BID) | ORAL | Status: DC
  Administered 2020-12-18 – 2020-12-22 (×8): 100 mg via ORAL
  Filled 2020-12-17 (×9): qty 1

## 2020-12-17 MED ORDER — PANTOPRAZOLE SODIUM 40 MG PO TBEC
40.0000 mg | DELAYED_RELEASE_TABLET | Freq: Every day | ORAL | Status: DC
  Administered 2020-12-18 – 2020-12-23 (×6): 40 mg via ORAL
  Filled 2020-12-17 (×6): qty 1

## 2020-12-17 MED ORDER — ACETAMINOPHEN 325 MG PO TABS: 650.0000 mg | ORAL_TABLET | Freq: Four times a day (QID) | ORAL | Status: DC | PRN

## 2020-12-17 MED ORDER — ACETAMINOPHEN 650 MG RE SUPP: 650.0000 mg | Freq: Four times a day (QID) | RECTAL | Status: DC | PRN

## 2020-12-17 MED ORDER — IRBESARTAN 300 MG PO TABS
300.0000 mg | ORAL_TABLET | Freq: Every day | ORAL | Status: DC
  Administered 2020-12-18 – 2020-12-23 (×6): 300 mg via ORAL
  Filled 2020-12-17 (×8): qty 1

## 2020-12-17 MED ORDER — CARVEDILOL 12.5 MG PO TABS
12.5000 mg | ORAL_TABLET | Freq: Two times a day (BID) | ORAL | Status: DC
  Administered 2020-12-18 – 2020-12-23 (×10): 12.5 mg via ORAL
  Filled 2020-12-17 (×10): qty 1

## 2020-12-17 MED ORDER — POLYETHYLENE GLYCOL 3350 17 G PO PACK: 17.0000 g | PACK | Freq: Every day | ORAL | Status: DC | PRN

## 2020-12-17 MED ORDER — CHLORHEXIDINE GLUCONATE CLOTH 2 % EX PADS
6.0000 | MEDICATED_PAD | Freq: Every day | CUTANEOUS | Status: DC
  Administered 2020-12-19 – 2020-12-21 (×3): 6 via TOPICAL

## 2020-12-17 MED ORDER — SODIUM CHLORIDE 0.9 % IV SOLN
60.0000 mg/kg | Freq: Once | INTRAVENOUS | Status: AC
  Administered 2020-12-18: 3110 mg via INTRAVENOUS
  Filled 2020-12-17 (×2): qty 31.1

## 2020-12-17 MED ORDER — SODIUM CHLORIDE 0.9% FLUSH
3.0000 mL | Freq: Two times a day (BID) | INTRAVENOUS | Status: DC
  Administered 2020-12-18 – 2020-12-22 (×3): 3 mL via INTRAVENOUS

## 2020-12-17 NOTE — H&P (Incomplete)
History and Physical   Cassandra Dyer WFU:[id number] DOB: Jun 20, 1941 DOA: 12/17/2020  PCP: Cassandra Borg, MD   Patient coming from: Home  Chief Complaint: Leg pain and arm shaking  HPI: Cassandra Dyer is a 80 y.o. female with medical history significant of stage IV lung cancer with brain metastasis, anemia, ESRD on HD Monday Wednesday Friday, COPD, hyperlipidemia, hypertension, hepatitis C, renal cell carcinoma, that was at [facility]ia who presents following 2 to 3 days of left leg pain and right arm shaking.   Patient reportedly has had to 3 days of left leg pain and right arm shaking as above.  She missed her dialysis session yesterday due to this.  Of note she is chronically on 2 L nasal cannula.  While in the ED she acutely became altered and had an apparent seizure.  Neurology was consulted she got a Keppra load and she is now mentating appropriately.  ED Course: Vital signs in the ED significant for blood pressure in the 202R to 427C systolic and requiring 2 to 4 L of supplemental O2.  Lab work-up showed BMP with sodium 129, chloride 88, potassium 5.7, bicarb 19, gap 22, BUN 123, creatinine 6.93 consistent with ESRD missing HD, glucose 139.  Calcium 8.3.  CBC showed hemoglobin of 10 which is stable and leukocytosis to 56 which has been steadily increasing on steroids.  Chest x-ray was showed bilateral pleural effusions with adjacent atelectasis likely pulmonary edema as well.  CT head showed no definitive acute change compared to MRI in December and chronic small vessel disease.  MR brain was ordered and is pending.  As above had a alteration in mental status resulting from suspected seizure in ED neurology was consulted MRI was ordered and Keppra load was given.  Patient also sent for MRI as above.  Review of Systems: As per HPI otherwise all other systems reviewed and are negative.  Past Medical History:  Diagnosis Date  . Anemia   . ESRD on hemodialysis ([place])   . Hepatitis    hx of hep C -  10-20 years ago   . History of blood transfusion   . History of kidney cancer   . Hyperlipidemia   . Hypertension    not taken b/p med in 2-3 years md deceased never went to another md   . Renal cell carcinoma ([place]) 2018  . Stage 4 lung cancer ([place])    new - due to start therapy with next appt on 12/14    Past Surgical History:  Procedure Laterality Date  . ANTERIOR APPROACH HEMI HIP ARTHROPLASTY Right 11/22/2020   Procedure: ANTERIOR APPROACH RIGHT HIP HEMIARTHROPLASTY;  Surgeon: Leandrew Koyanagi, MD;  Location: [place];  Service: Orthopedics;  Laterality: Right;  Needs RNFA  . AV FISTULA PLACEMENT Left 08/07/2020   Procedure: LEFT ARM ARTERIOVENOUS (AV) FISTULA CREATION;  Surgeon: [name], MD;  Location: [place];  Service: Vascular;  Laterality: Left;  . BRONCHIAL BIOPSY  11/05/2020   Procedure: BRONCHIAL BIOPSIES;  Surgeon: Collene Gobble, MD;  Location: [facility] ENDOSCOPY;  Service: Cardiopulmonary;;  . BRONCHIAL BRUSHINGS  11/05/2020   Procedure: BRONCHIAL BRUSHINGS;  Surgeon: Collene Gobble, MD;  Location: [place];  Service: Cardiopulmonary;;  . BRONCHIAL NEEDLE ASPIRATION BIOPSY  11/05/2020   Procedure: BRONCHIAL NEEDLE ASPIRATION BIOPSIES;  Surgeon: Collene Gobble, MD;  Location: [place];  Service: Cardiopulmonary;;  . CATARACT EXTRACTION Bilateral    11/30 left /12/9 right  . ELECTROMAGNETIC NAVIGATION BROCHOSCOPY N/A 11/05/2020  Procedure: ELECTROMAGNETIC NAVIGATION BRONCHOSCOPY;  Surgeon: Collene Gobble, MD;  Location: [facility] ENDOSCOPY;  Service: Cardiopulmonary;  Laterality: N/A;  . ENDOBRONCHIAL ULTRASOUND N/A 11/05/2020   Procedure: ENDOBRONCHIAL ULTRASOUND;  Surgeon: Collene Gobble, MD;  Location: [facility] ENDOSCOPY;  Service: Cardiopulmonary;  Laterality: N/A;  . FINE NEEDLE ASPIRATION  11/05/2020   Procedure: FINE NEEDLE ASPIRATION (FNA) LINEAR;  Surgeon: Collene Gobble, MD;  Location: [place] ENDOSCOPY;  Service: Cardiopulmonary;;  . IR FLUORO GUIDE CV LINE RIGHT  07/27/2020   . IR THORACENTESIS ASP PLEURAL SPACE W/IMG GUIDE  10/24/2020  . IR THORACENTESIS ASP PLEURAL SPACE W/IMG GUIDE  10/25/2020  . IR THORACENTESIS ASP PLEURAL SPACE W/IMG GUIDE  10/31/2020  . IR US GUIDE VASC ACCESS RIGHT  07/27/2020  . ROBOT ASSISTED LAPAROSCOPIC NEPHRECTOMY Right 01/13/2017   Procedure: XI ROBOTIC ASSISTED LAPAROSCOPIC RADICAL NEPHRECTOMY WITH LYSIS OF ADHESION;  Surgeon: Alexis Frock, MD;  Location[facility];  Service: Urology;  Laterality: Right;  Marland Kitchen VIDEO BRONCHOSCOPY N/A 11/05/2020   Procedure: VIDEO BRONCHOSCOPY WITH FLUORO;  Surgeon: Collene Gobble, MD;  Location[facility] ENDOSCOPY;  Service: Cardiopulmonary;  Laterality: N/A;    Social History  reports that she quit smoking about 20 years ago. Her smoking use included cigarettes. She has a 15.00 pack-year smoking history. She has never used smokeless tobacco. She reports current alcohol use. She reports that she does not use drugs.  No Known Allergies  Family History  Problem Relation Age of Onset  . Hypertension Mother   . Pancreatic cancer Father   . Breast cancer Sister   Reviewed on admission  Prior to Admission medications   Medication Sig Start Date End Date Taking? Authorizing Provider  amLODipine (NORVASC) 10 MG tablet Take 1 tablet (10 mg total) by mouth daily. 04/19/20   Cassandra Borg, MD  ascorbic acid (VITAMIN C) 500 MG tablet Take 500 mg by mouth 2 (two) times daily.  08/31/20   [provider]  Black Currant Seed Oil 500 MG CAPS Take 500 mg by mouth every morning.     [provider]  Calcium Carbonate-Vit D-Min (CALCIUM 1200 PO) Take 1,200 mg by mouth every morning.     [provider]  carvedilol (COREG) 12.5 MG tablet Take 1 tablet (12.5 mg total) by mouth 2 (two) times daily with a meal. 11/28/20   Regalado, Belkys A, MD  Cholecalciferol (VITAMIN D3) 50 MCG (2000 UT) capsule Take 2,000 Units by mouth every morning. 08/31/20   [provider]  dexamethasone (DECADRON) 4  MG tablet Take 1 tablet (4 mg total) by mouth every 8 (eight) hours. 11/28/20   Regalado, Belkys A, MD  docusate sodium (COLACE) 100 MG capsule Take 1 capsule (100 mg total) by mouth 2 (two) times daily. 11/28/20   Regalado, Belkys A, MD  enoxaparin (LOVENOX) 30 MG/0.3ML injection Inject 0.3 mLs (30 mg total) into the skin daily for 14 days. 11/22/20 12/06/20  Leandrew Koyanagi, MD  Garlic 3419 MG CAPS Take 2,000 mg by mouth daily.     [provider]  Multiple Vitamin (MULTIVITAMIN WITH MINERALS) TABS tablet Take 1 tablet by mouth every morning.     [provider]  Omega-3 Fatty Acids (FISH OIL PO) Take 1 capsule by mouth every morning.     [provider]  ondansetron (ZOFRAN) 4 MG tablet Take 1 tablet (4 mg total) by mouth every 8 (eight) hours as needed for nausea or vomiting. 04/19/20   Cassandra Borg, MD  oxyCODONE-acetaminophen (PERCOCET) 5-325 MG tablet Take 1-2 tablets by mouth every 8 (eight) hours as needed for severe pain. 11/22/20   Leandrew Koyanagi, MD  pantoprazole (PROTONIX) 40 MG tablet Take 1 tablet (40 mg total) by mouth daily. 11/28/20   Regalado, Belkys A, MD  polyethylene glycol (MIRALAX / GLYCOLAX) 17 g packet Take 17 g by mouth daily as needed for mild constipation. 09/12/20   Domenic Polite, MD  prochlorperazine (COMPAZINE) 10 MG tablet Take 1 tablet (10 mg total) by mouth every 6 (six) hours as needed. 12/05/20   Heilingoetter, Cassandra L, PA-C  rosuvastatin (CRESTOR) 10 MG tablet Take 1 tablet (10 mg total) by mouth daily. 09/26/20   Cassandra Borg, MD  senna-docusate (SENOKOT-S) 8.6-50 MG tablet Take 1 tablet by mouth daily as needed for mild constipation. 09/12/20   Domenic Polite, MD  telmisartan (MICARDIS) 80 MG tablet Take 80 mg by mouth daily. 10/15/20   [provider]    Physical Exam: Vitals:   12/17/20 2100 12/17/20 2115 12/17/20 2215 12/17/20 2230  BP: (!) 184/75 (!) 194/82 (!) 182/80 (!) 194/84  Pulse: 94 92 86 92  Resp: 19 14 18  13   Temp:      TempSrc:      SpO2: 99% 99% 99% 100%   Physical Exam Constitutional:      General: She is not in acute distress.    Appearance: Normal appearance.  HENT:     Head: Normocephalic and atraumatic.     Mouth/Throat:     Mouth: Mucous membranes are moist.     Pharynx: Oropharynx is clear.  Eyes:     Extraocular Movements: Extraocular movements intact.     Pupils: Pupils are equal, round, and reactive to light.  Cardiovascular:     Rate and Rhythm: Normal rate and regular rhythm.     Pulses: Normal pulses.     Heart sounds: Normal heart sounds.  Pulmonary:     Effort: Pulmonary effort is normal. No respiratory distress.     Comments: Decreased breath sounds bilateral bases Abdominal:     General: Bowel sounds are normal. There is no distension.     Palpations: Abdomen is soft.     Tenderness: There is no abdominal tenderness.  Musculoskeletal:        General: No swelling or deformity.  Skin:    General: Skin is warm and dry.  Neurological:     General: No focal deficit present.     Mental Status: Mental status is at baseline.     Comments: Mental Status: Patient is awake, alert, oriented x3 No signs of aphasia or neglect Cranial Nerves: II: Pupils equal, round, and reactive to light.  III,IV, VI: EOMI without ptosis or diploplia.  V: Facial sensation is symmetric tolight touch discussed washing left. VII: Facial movement is symmetric.  VIII: hearing is intact to voice X: Uvula elevates symmetrically XI: Shoulder shrug is symmetric. XII: tongue is midline without atrophy or fasciculations.  Motor: Good effort thorughout, at Least 5/5 bilateral UE, 5/5 bilateral lower extremitiy Sensory: Sensation is grossly intact bilateral UEs & LEs     Labs on Admission: I have personally reviewed following labs and imaging studies  CBC: Recent Labs  Lab 12/17/20 1636  WBC 56.2*  HGB 10.0*  HCT 31.7*  MCV 90.8  PLT 269    Basic Metabolic Panel: Recent  Labs  Lab 12/17/20 1636  NA 129*  K 5.7*  CL 88*  CO2 19*  GLUCOSE 139*  BUN 123*  CREATININE 6.93*  CALCIUM 8.3*    GFR: Estimated Creatinine Clearance: 5.4 mL/min (A) (by C-G formula based on SCr of 6.93 mg/dL (H)).  Liver Function Tests: No results for input(s): AST, ALT, ALKPHOS, BILITOT, PROT, ALBUMIN in the last 168 hours.  Urine analysis:    Component Value Date/Time   COLORURINE YELLOW 07/28/2020 1131   APPEARANCEUR HAZY (A) 07/28/2020 1131   LABSPEC 1.016 07/28/2020 1131   PHURINE 5.0 07/28/2020 1131   GLUCOSEU NEGATIVE 07/28/2020 [place] 04/26/2020 0909   HGBUR NEGATIVE 07/28/2020 1131   [place] 07/28/2020 1131   KETONESUR NEGATIVE 07/28/2020 1131   PROTEINUR NEGATIVE 07/28/2020 1131   UROBILINOGEN 0.2 04/26/2020 0909   NITRITE NEGATIVE 07/28/2020 1131   LEUKOCYTESUR LARGE (A) 07/28/2020 1131    Radiological Exams on Admission: CT Head Wo Contrast  Result Date: 12/17/2020 CLINICAL DATA:  Mental status change EXAM: CT HEAD WITHOUT CONTRAST TECHNIQUE: Contiguous axial images were obtained from the base of the skull through the vertex without intravenous contrast. COMPARISON:  MRI 11/26/2020 FINDINGS: Brain: No acute interval hemorrhage. Stable ventricle size. Grossly similar edema within the left parietal white matter. Slightly dense metastatic lesions are again noted. 1.4 x 1.3 cm left parietal mass, series 4, image number 26. Small 7 mm hyperdense mass within the left frontal lobe, series 4, image number 25. Hyperdense mass within the right lateral ventricle measuring 1.6 x 2.3 cm. Approximate 14 mm left cerebellar lesion vaguely visualized. The other metastatic lesions on MRI are better seen on MRI. No significant midline shift. Vascular: No hyperdense vessels.  Carotid vascular calcification. Skull: Normal. Negative for fracture or focal lesion. Sinuses/Orbits: No acute finding. Other: None IMPRESSION: 1. No definite acute interval  change as compared with MRI from December 2021 allowing for limitations of inter modality comparison. Intracranial metastatic disease with grossly stable edema within the left parietal white matter. Only some of the metastatic lesions are appreciated on this non contrasted CT. 2. Mild chronic small vessel ischemic change of the white matter. Electronically Signed   By: Donavan Foil M.D.   On: 12/17/2020 19:41   DG Chest Port 1 View  Result Date: 12/17/2020 CLINICAL DATA:  Altered mental status, leg swelling EXAM: PORTABLE CHEST 1 VIEW COMPARISON:  November 20, 2020 FINDINGS: Bilateral layering pleural effusions with adjacent atelectasis. Probable mild pulmonary edema. No pneumothorax. Stable cardiomediastinal contours. Right tunneled line is unchanged. IMPRESSION: Bilateral layering pleural effusions with adjacent atelectasis. Probable mild pulmonary edema. Electronically Signed   By: Macy Mis M.D.   On: 12/17/2020 19:46   EKG: Independently reviewed.  Normal sinus rhythm at 90 bpm.  Some nonspecific T wave changes similar to previous.  Assessment/Plan Principal Problem:   Seizures ([place]) Active Problems:   Essential hypertension   Dyslipidemia   Hyperkalemia   Anemia   ESRD (end stage renal disease) (HCC)   Volume overload   Brain metastases (HCC)   Hyponatremia  Seizure > Patient presented with history of left foot pain and right arm shaking, in ED she had episode of altered mental status from suspected seizure has improved back to baseline and received Keppra load. > Etiology clearly is underlying brain mets from her lung cancer > Neurology consulted in the ED - Appreciate neurology recommendations - Keppra load being given, have ordered renally dosed Keppra 250 mg daily - Follow-up MRI - EEG  Hypervolemic hyponatremia Hyperkalemia Mild Volume overload ESRD on HD Monday Wednesday Friday Gap metabolic acidosis > Patient  with electrolyte abnormalities and mild volume overload  in setting of missing dialysis yesterday.  Overload is likely mild due to decreased p.o. intake. > Nephrology consulted in ED - Appreciate nephrology recommendations and assistance - Treat hyponatremia with volume removal tomorrow - Continue to monitor hyperkalemia, plan for volume removal tomorrow - Gap metabolic acidosis is due to elevated BUN - Avoid nephrotoxic agents - Trend electrolytes and renal function  Stage IV lung cancer with brain mets > On dexamethasone every 8 hours, this explains her slowly increasing leukocytosis currently at 56 > Had first dose of palliative chemo regimen (carboplatin, paclitaxel, Keytruda) plan for today. - Continue dexamethasone - Follow-up brain MRI - Supportive care  Hypertension > BP 242P 536R systolic in ED - Continue home carvedilol, amlodipine - Replace home telmisartan with formulary irbesartan  Hyperlipidemia - Continue home rosuvastatin  DVT prophylaxis: Heparin  Code Status:   DNR Family Communication:  *** Disposition Plan:   Patient is from:  Home  Anticipated DC to:  Pending clinical course  Anticipated DC date:  Pending clinical course  Anticipated DC barriers: Metastatic lung cancer  Consults called:  Neurology and nephrology consulted in ED  Admission status:  Observation, telemetry   Severity of Illness: The appropriate patient status for this patient is OBSERVATION. Observation status is judged to be reasonable and necessary in order to provide the required intensity of service to ensure the patient's safety. The patient's presenting symptoms, physical exam findings, and initial radiographic and laboratory data in the context of their medical condition is felt to place them at decreased risk for further clinical deterioration. Furthermore, it is anticipated that the patient will be medically stable for discharge from the hospital within 2 midnights of admission. The following factors support the patient status of observation.    " The patient's presenting symptoms include left leg pain, right arm shaking, seizure in ED. " The physical exam findings include ***. " The initial radiographic and laboratory data are concerning for hyponatremia, hyperkalemia, worsening renal function, acidosis, leukocytosis, anemia.   Marcelyn Bruins MD [facility]  How to contact the [facility] Attending or Consulting provider [place] or covering provider during after hours [place], for this patient?   1. Check the care team in [facility] and look for a) attending/consulting TRH provider listed and b) the [facility] team listed 2. Log into [email or web address] and use [place]'s universal password to access. If you do not have the password, please contact the hospital operator. 3. Locate the [facility] provider you are looking for under [facility] and page to a number that you can be directly reached. 4. If you still have difficulty reaching the provider, please page the [facility] (Director on Call) for the Hospitalists listed on amion for assistance.  12/17/2020, 11:41 PM

## 2020-12-17 NOTE — ED Notes (Signed)
[place] [phone] sister wants an update!!!

## 2020-12-17 NOTE — H&P (Signed)
History and Physical   Cassandra Dyer MPN:[id number] DOB: 1941/03/22 DOA: 12/17/2020  PCP: Biagio Borg, MD   Patient coming from: Home  Chief Complaint: Leg pain and arm shaking  HPI: Cassandra Dyer is a 80 y.o. female with medical history significant of stage IV lung cancer with brain metastasis, anemia, ESRD on HD Monday Wednesday Friday, COPD, hyperlipidemia, hypertension, hepatitis C, renal cell carcinoma, and thrombocytopenia who presents following 2 to 3 days of left leg pain and right arm shaking.   Patient reportedly has had to 3 days of left leg pain and right arm shaking as above.  She missed her dialysis session yesterday due to this.  Of note she is chronically on 2 L nasal cannula. While in the ED she acutely became altered and had an apparent seizure.  Neurology was consulted she got a Keppra load and she is now mentating appropriately. Denies fevers, chills, chest pain, shortness of breath, abdominal pain. She endorses a decreased appetite, with decreased p.o. intake. She states that her arm shaking has resolved and that her right leg/groin pain is better right now. She states it was the pain that caused her to miss her dialysis session yesterday. Her sister states that she has a PET scan scheduled for later in the month and that when she had pain in her right leg during her earlier admission it was due to a fracture. No history of fall leading to this admission.  ED Course: Vital signs in the ED significant for blood pressure in the 008Q to 761P systolic and requiring 2 to 4 L of supplemental O2.  Lab work-up showed BMP with sodium 129, chloride 88, potassium 5.7, bicarb 19, gap 22, BUN 123, creatinine 6.93 consistent with ESRD missing HD, glucose 139.  Calcium 8.3.  CBC showed hemoglobin of 10 which is stable and leukocytosis to 56 which has been steadily increasing on steroids.  Chest x-ray was showed bilateral pleural effusions with adjacent atelectasis likely pulmonary edema as  well.  CT head showed no definitive acute change compared to MRI in December and chronic small vessel disease.  MR brain was ordered and is pending.  As above had a alteration in mental status resulting from suspected seizure in ED neurology was consulted MRI was ordered and Keppra load was given.  Patient also sent for MRI as above.  Review of Systems: As per HPI otherwise all other systems reviewed and are negative.  Past Medical History:  Diagnosis Date   Anemia    ESRD on hemodialysis (HCC)    Hepatitis    hx of hep C - 10-20 years ago    History of blood transfusion    History of kidney cancer    Hyperlipidemia    Hypertension    not taken b/p med in 2-3 years md deceased never went to another md    Renal cell carcinoma ([place]) 2018   Stage 4 lung cancer ([place])    new - due to start therapy with next appt on 12/14    Past Surgical History:  Procedure Laterality Date   ANTERIOR APPROACH HEMI HIP ARTHROPLASTY Right 11/22/2020   Procedure: ANTERIOR APPROACH RIGHT HIP HEMIARTHROPLASTY;  Surgeon: Leandrew Koyanagi, MD;  Location: [place];  Service: Orthopedics;  Laterality: Right;  Needs RNFA   AV FISTULA PLACEMENT Left 08/07/2020   Procedure: LEFT ARM ARTERIOVENOUS (AV) FISTULA CREATION;  Surgeon: [name], MD;  Location: [facility];  Service: Vascular;  Laterality: Left;   BRONCHIAL  BIOPSY  11/05/2020   Procedure: BRONCHIAL BIOPSIES;  Surgeon: Collene Gobble, MD;  Location: [facility] ENDOSCOPY;  Service: Cardiopulmonary;;   BRONCHIAL BRUSHINGS  11/05/2020   Procedure: BRONCHIAL BRUSHINGS;  Surgeon: Collene Gobble, MD;  Location: [place];  Service: Cardiopulmonary;;   BRONCHIAL NEEDLE ASPIRATION BIOPSY  11/05/2020   Procedure: BRONCHIAL NEEDLE ASPIRATION BIOPSIES;  Surgeon: Collene Gobble, MD;  Location: [place];  Service: Cardiopulmonary;;   CATARACT EXTRACTION Bilateral    11/30 left /12/9 right   ELECTROMAGNETIC NAVIGATION BROCHOSCOPY N/A 11/05/2020   Procedure:  ELECTROMAGNETIC NAVIGATION BRONCHOSCOPY;  Surgeon: Collene Gobble, MD;  Location: [place];  Service: Cardiopulmonary;  Laterality: N/A;   ENDOBRONCHIAL ULTRASOUND N/A 11/05/2020   Procedure: ENDOBRONCHIAL ULTRASOUND;  Surgeon: Collene Gobble, MD;  Location: [facility] ENDOSCOPY;  Service: Cardiopulmonary;  Laterality: N/A;   FINE NEEDLE ASPIRATION  11/05/2020   Procedure: FINE NEEDLE ASPIRATION (FNA) LINEAR;  Surgeon: Collene Gobble, MD;  Location: [place];  Service: Cardiopulmonary;;   IR FLUORO GUIDE CV LINE RIGHT  07/27/2020   IR THORACENTESIS ASP PLEURAL SPACE W/IMG GUIDE  10/24/2020   IR THORACENTESIS ASP PLEURAL SPACE W/IMG GUIDE  10/25/2020   IR THORACENTESIS ASP PLEURAL SPACE W/IMG GUIDE  10/31/2020   IR US GUIDE VASC ACCESS RIGHT  07/27/2020   ROBOT ASSISTED LAPAROSCOPIC NEPHRECTOMY Right 01/13/2017   Procedure: XI ROBOTIC ASSISTED LAPAROSCOPIC RADICAL NEPHRECTOMY WITH LYSIS OF ADHESION;  Surgeon: Alexis Frock, MD;  Location: [facility];  Service: Urology;  Laterality: Right;   VIDEO BRONCHOSCOPY N/A 11/05/2020   Procedure: VIDEO BRONCHOSCOPY WITH FLUORO;  Surgeon: Collene Gobble, MD;  Location[facility] ENDOSCOPY;  Service: Cardiopulmonary;  Laterality: N/A;    Social History  reports that she quit smoking about 20 years ago. Her smoking use included cigarettes. She has a 15.00 pack-year smoking history. She has never used smokeless tobacco. She reports current alcohol use. She reports that she does not use drugs.  No Known Allergies  Family History  Problem Relation Age of Onset   Hypertension Mother    Pancreatic cancer Father    Breast cancer Sister   Reviewed on admission  Prior to Admission medications   Medication Sig Start Date End Date Taking? Authorizing Provider  amLODipine (NORVASC) 10 MG tablet Take 1 tablet (10 mg total) by mouth daily. 04/19/20   Biagio Borg, MD  ascorbic acid (VITAMIN C) 500 MG tablet Take 500 mg by mouth 2 (two) times daily.  08/31/20    [provider]  Black Currant Seed Oil 500 MG CAPS Take 500 mg by mouth every morning.     [provider]  Calcium Carbonate-Vit D-Min (CALCIUM 1200 PO) Take 1,200 mg by mouth every morning.     [provider]  carvedilol (COREG) 12.5 MG tablet Take 1 tablet (12.5 mg total) by mouth 2 (two) times daily with a meal. 11/28/20   Regalado, Belkys A, MD  Cholecalciferol (VITAMIN D3) 50 MCG (2000 UT) capsule Take 2,000 Units by mouth every morning. 08/31/20   [provider]  dexamethasone (DECADRON) 4 MG tablet Take 1 tablet (4 mg total) by mouth every 8 (eight) hours. 11/28/20   Regalado, Belkys A, MD  docusate sodium (COLACE) 100 MG capsule Take 1 capsule (100 mg total) by mouth 2 (two) times daily. 11/28/20   Regalado, Belkys A, MD  enoxaparin (LOVENOX) 30 MG/0.3ML injection Inject 0.3 mLs (30 mg total) into the skin daily for 14 days. 11/22/20 12/06/20  Leandrew Koyanagi,  MD  Garlic 5631 MG CAPS Take 2,000 mg by mouth daily.     [provider]  Multiple Vitamin (MULTIVITAMIN WITH MINERALS) TABS tablet Take 1 tablet by mouth every morning.     [provider]  Omega-3 Fatty Acids (FISH OIL PO) Take 1 capsule by mouth every morning.     [provider]  ondansetron (ZOFRAN) 4 MG tablet Take 1 tablet (4 mg total) by mouth every 8 (eight) hours as needed for nausea or vomiting. 04/19/20   Biagio Borg, MD  oxyCODONE-acetaminophen (PERCOCET) 5-325 MG tablet Take 1-2 tablets by mouth every 8 (eight) hours as needed for severe pain. 11/22/20   Leandrew Koyanagi, MD  pantoprazole (PROTONIX) 40 MG tablet Take 1 tablet (40 mg total) by mouth daily. 11/28/20   Regalado, Belkys A, MD  polyethylene glycol (MIRALAX / GLYCOLAX) 17 g packet Take 17 g by mouth daily as needed for mild constipation. 09/12/20   Domenic Polite, MD  prochlorperazine (COMPAZINE) 10 MG tablet Take 1 tablet (10 mg total) by mouth every 6 (six) hours as needed. 12/05/20   Heilingoetter,  Cassandra L, PA-C  rosuvastatin (CRESTOR) 10 MG tablet Take 1 tablet (10 mg total) by mouth daily. 09/26/20   Biagio Borg, MD  senna-docusate (SENOKOT-S) 8.6-50 MG tablet Take 1 tablet by mouth daily as needed for mild constipation. 09/12/20   Domenic Polite, MD  telmisartan (MICARDIS) 80 MG tablet Take 80 mg by mouth daily. 10/15/20   [provider]    Physical Exam: Vitals:   12/17/20 2100 12/17/20 2115 12/17/20 2215 12/17/20 2230  BP: (!) 184/75 (!) 194/82 (!) 182/80 (!) 194/84  Pulse: 94 92 86 92  Resp: 19 14 18 13   Temp:      TempSrc:      SpO2: 99% 99% 99% 100%   Physical Exam Constitutional:      General: She is not in acute distress.    Appearance: Normal appearance.  HENT:     Head: Normocephalic and atraumatic.     Mouth/Throat:     Mouth: Mucous membranes are moist.     Pharynx: Oropharynx is clear.  Eyes:     Extraocular Movements: Extraocular movements intact.     Pupils: Pupils are equal, round, and reactive to light.  Cardiovascular:     Rate and Rhythm: Normal rate and regular rhythm.     Pulses: Normal pulses.     Heart sounds: Normal heart sounds.  Pulmonary:     Effort: Pulmonary effort is normal. No respiratory distress.     Comments: Decreased breath sounds bilateral bases Abdominal:     General: Bowel sounds are normal. There is no distension.     Palpations: Abdomen is soft.     Tenderness: There is no abdominal tenderness.  Musculoskeletal:        General: No swelling or deformity.  Skin:    General: Skin is warm and dry.  Neurological:     General: No focal deficit present.     Mental Status: Mental status is at baseline.     Comments: Mental Status: Patient is awake, alert, oriented x3 No signs of aphasia or neglect Cranial Nerves: II: Pupils equal, round, and reactive to light.  III,IV, VI: EOMI without ptosis or diploplia.  V: Facial sensation is symmetric tolight touch discussed washing left. VII: Facial movement is  symmetric.  VIII: hearing is intact to voice X: Uvula elevates symmetrically XI: Shoulder shrug is symmetric. XII: tongue is  midline without atrophy or fasciculations.  Motor: Good effort thorughout, at Least 5/5 bilateral UE, 5/5 bilateral lower extremitiy Sensory: Sensation is grossly intact bilateral UEs & LEs     Labs on Admission: I have personally reviewed following labs and imaging studies  CBC: Recent Labs  Lab 12/17/20 1636  WBC 56.2*  HGB 10.0*  HCT 31.7*  MCV 90.8  PLT 283    Basic Metabolic Panel: Recent Labs  Lab 12/17/20 1636  NA 129*  K 5.7*  CL 88*  CO2 19*  GLUCOSE 139*  BUN 123*  CREATININE 6.93*  CALCIUM 8.3*    GFR: Estimated Creatinine Clearance: 5.4 mL/min (A) (by C-G formula based on SCr of 6.93 mg/dL (H)).  Liver Function Tests: No results for input(s): AST, ALT, ALKPHOS, BILITOT, PROT, ALBUMIN in the last 168 hours.  Urine analysis:    Component Value Date/Time   COLORURINE YELLOW 07/28/2020 1131   APPEARANCEUR HAZY (A) 07/28/2020 1131   LABSPEC 1.016 07/28/2020 1131   PHURINE 5.0 07/28/2020 1131   GLUCOSEU NEGATIVE 07/28/2020 [place] 04/26/2020 0909   HGBUR NEGATIVE 07/28/2020 1131   [place] 07/28/2020 1131   KETONESUR NEGATIVE 07/28/2020 1131   PROTEINUR NEGATIVE 07/28/2020 1131   UROBILINOGEN 0.2 04/26/2020 0909   NITRITE NEGATIVE 07/28/2020 1131   LEUKOCYTESUR LARGE (A) 07/28/2020 1131    Radiological Exams on Admission: CT Head Wo Contrast  Result Date: 12/17/2020 CLINICAL DATA:  Mental status change EXAM: CT HEAD WITHOUT CONTRAST TECHNIQUE: Contiguous axial images were obtained from the base of the skull through the vertex without intravenous contrast. COMPARISON:  MRI 11/26/2020 FINDINGS: Brain: No acute interval hemorrhage. Stable ventricle size. Grossly similar edema within the left parietal white matter. Slightly dense metastatic lesions are again noted. 1.4 x 1.3 cm left parietal mass,  series 4, image number 26. Small 7 mm hyperdense mass within the left frontal lobe, series 4, image number 25. Hyperdense mass within the right lateral ventricle measuring 1.6 x 2.3 cm. Approximate 14 mm left cerebellar lesion vaguely visualized. The other metastatic lesions on MRI are better seen on MRI. No significant midline shift. Vascular: No hyperdense vessels.  Carotid vascular calcification. Skull: Normal. Negative for fracture or focal lesion. Sinuses/Orbits: No acute finding. Other: None IMPRESSION: 1. No definite acute interval change as compared with MRI from December 2021 allowing for limitations of inter modality comparison. Intracranial metastatic disease with grossly stable edema within the left parietal white matter. Only some of the metastatic lesions are appreciated on this non contrasted CT. 2. Mild chronic small vessel ischemic change of the white matter. Electronically Signed   By: Donavan Foil M.D.   On: 12/17/2020 19:41   DG Chest Port 1 View  Result Date: 12/17/2020 CLINICAL DATA:  Altered mental status, leg swelling EXAM: PORTABLE CHEST 1 VIEW COMPARISON:  November 20, 2020 FINDINGS: Bilateral layering pleural effusions with adjacent atelectasis. Probable mild pulmonary edema. No pneumothorax. Stable cardiomediastinal contours. Right tunneled line is unchanged. IMPRESSION: Bilateral layering pleural effusions with adjacent atelectasis. Probable mild pulmonary edema. Electronically Signed   By: Macy Mis M.D.   On: 12/17/2020 19:46   EKG: Independently reviewed.  Normal sinus rhythm at 90 bpm.  Some nonspecific T wave changes similar to previous.  Assessment/Plan Principal Problem:   Seizures ([place]) Active Problems:   Essential hypertension   Dyslipidemia   Hyperkalemia   Anemia   ESRD (end stage renal disease) (HCC)   Volume overload   Brain metastases ([place])  Hyponatremia  Seizure > Patient presented with history of left foot pain and right arm shaking, in ED she  had episode of altered mental status from suspected seizure has improved back to baseline and received Keppra load. > Etiology clearly is underlying brain mets from her lung cancer > Neurology consulted in the ED - Appreciate neurology recommendations - Keppra load being given, have ordered renally dosed Keppra 250 mg daily - Follow-up MRI - EEG  Hypervolemic hyponatremia Hyperkalemia Mild Volume overload ESRD on HD Monday Wednesday Friday Gap metabolic acidosis > Patient with electrolyte abnormalities and mild volume overload in setting of missing dialysis yesterday.  Overload is likely mild due to decreased p.o. intake. > Nephrology consulted in ED - Appreciate nephrology recommendations and assistance - Treat hyponatremia with volume removal tomorrow - Continue to monitor hyperkalemia, plan for volume removal tomorrow - Gap metabolic acidosis is due to elevated BUN - Avoid nephrotoxic agents - Trend electrolytes and renal function  Stage IV lung cancer with brain mets > On dexamethasone every 8 hours, this explains her slowly increasing leukocytosis currently at 56 > Had first dose of palliative chemo regimen (carboplatin, paclitaxel, Keytruda) plan for today. - Continue dexamethasone - Follow-up brain MRI - Supportive care  Hypertension > BP 173V 670L systolic in ED - Continue home carvedilol, amlodipine - Replace home telmisartan with formulary irbesartan  Hyperlipidemia - Continue home rosuvastatin  DVT prophylaxis: Heparin  Code Status:   DNR Family Communication:  Sister updated by phone Disposition Plan:   Patient is from:  Home  Anticipated DC to:  Pending clinical course  Anticipated DC date:  Pending clinical course  Anticipated DC barriers: Metastatic lung cancer  Consults called:  Neurology and nephrology consulted in ED  Admission status:  Observation, telemetry   Severity of Illness: The appropriate patient status for this patient is OBSERVATION.  Observation status is judged to be reasonable and necessary in order to provide the required intensity of service to ensure the patient's safety. The patient's presenting symptoms, physical exam findings, and initial radiographic and laboratory data in the context of their medical condition is felt to place them at decreased risk for further clinical deterioration. Furthermore, it is anticipated that the patient will be medically stable for discharge from the hospital within 2 midnights of admission. The following factors support the patient status of observation.   " The patient's presenting symptoms include left leg pain, right arm shaking, seizure in ED. " The physical exam findings include decreased breath sounds at bases. " The initial radiographic and laboratory data are concerning for hyponatremia, hyperkalemia, worsening renal function, acidosis, leukocytosis, anemia.   Marcelyn Bruins MD [facility]  How to contact the [facility] Attending or Consulting provider [place] or covering provider during after hours [place], for this patient?   1. Check the care team in [facility] and look for a) attending/consulting [facility] provider listed and b) the [facility] team listed 2. Log into [email or web address] and use [place]'s universal password to access. If you do not have the password, please contact the hospital operator. 3. Locate the [facility] provider you are looking for under [facility] and page to a number that you can be directly reached. 4. If you still have difficulty reaching the provider, please page the [facility] (Director on Call) for the Hospitalists listed on amion for assistance.  12/17/2020, 11:41 PM

## 2020-12-17 NOTE — ED Notes (Signed)
Pt transported to MRI 

## 2020-12-17 NOTE — ED Triage Notes (Signed)
Pt from home with ems c.o left leg pain and right arm shaking. Dialysis MWF but missed yesterday due to her leg pain. Pt on 2L [place] chronically. Pt a.o, nad noted

## 2020-12-17 NOTE — ED Provider Notes (Signed)
[facility] EMERGENCY DEPARTMENT Provider Note   CSN: [id number] Arrival date & time: 12/17/20  1550     History Chief Complaint  Patient presents with  . Leg Pain  . Arm Shaking    Cassandra Dyer is a 80 y.o. 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  Patient reports that for the last 2 to 3 days she stated having pain in her left groin.  She reports that the pain started while she was walking but denies any injury or fall.  She ports resolution of pain when laying down at rest.  Patient denies previous episodes of left leg pain.  Patient denies any swelling or erythema.  Patient also complains of right arm shaking.  She reports this has been going on for the last 2 days.  Reports that she had a similar episode of arm shaking in the past but does not have further details.    Patient denies any extremity numbness tingling, extremity weakness, seizures or history of seizures, facial asymmetry, slurred speech, fevers, chills, headaches, lightheadedness, dizziness, or syncope.  Patient reports last dialysis appointment was Friday 01/14/20.  Per chart review last radiation for metastatic renal lesion on 12/10/20.  Patient reports she not started chemotherapy.     HPI     Past Medical History:  Diagnosis Date  . Anemia   . ESRD on hemodialysis ([place])   . Hepatitis    hx of hep C - 10-20 years ago   . History of blood transfusion   . History of kidney cancer   . Hyperlipidemia   . Hypertension    not taken b/p med in 2-3 years md deceased never went to another md   . Renal cell carcinoma ([place]) 2018  . Stage 4 lung cancer ([place])    new - due to start therapy with next appt on 12/14    Patient Active Problem List   Diagnosis Date Noted  . Seizures ([place]) 12/17/2020  . Hyponatremia 12/17/2020  . Brain metastases ([place])  12/11/2020  . Anemia in chronic kidney disease 12/05/2020  . Encounter for antineoplastic immunotherapy 12/05/2020  . Closed displaced fracture of right femoral neck ([place]) 11/22/2020  . Noncompliance of patient with renal dialysis ([place]) 11/20/2020  . Adenocarcinoma of left lung, stage 4 ([place]) 11/16/2020  . Encounter for antineoplastic chemotherapy 11/16/2020  . Goals of care, counseling/discussion 11/16/2020  . Mass of upper lobe of left lung 11/05/2020  . Mediastinal adenopathy 11/05/2020  . Pulmonary nodules 11/05/2020  . Acute respiratory failure with hypoxia ([place]) 10/23/2020  . Pulmonary edema 10/22/2020  . Acute pulmonary edema ([place]) 10/22/2020  . Educated about COVID-19 virus infection 10/10/2020  . Chronic hypoxemic respiratory failure ([place]) 09/26/2020  . COPD (chronic obstructive pulmonary disease) ([place]) 09/26/2020  . Nausea vomiting and diarrhea 09/10/2020  . ESRD (end stage renal disease) ([place])   . Volume overload   . Cough 08/04/2020  . Thrombocytopenia ([place]) 08/02/2020  . Anemia 08/02/2020  . Hyperkalemia 07/27/2020  . Hyperlipidemia 07/27/2020  . Metabolic acidosis 05/39/7673  . CKD (chronic kidney disease) 05/23/2020  . Solitary kidney, acquired 05/23/2020  . Fever 04/26/2020  . Acute hypoxemic respiratory failure ([place]) 04/19/2020  . Diarrhea 04/19/2020  . Nausea & vomiting 04/19/2020  . Pneumonia 04/09/2020  . Acute kidney injury (AKI) with acute tubular necrosis (ATN) ([place]) 04/09/2020  . Hypertensive urgency 04/09/2020  .  History of hepatitis C 04/09/2020  . Sepsis ([place]) 04/08/2020  . Venous insufficiency 12/28/2019  . Hyperglycemia 06/29/2019  . Osteoporosis 02/20/2018  . Essential hypertension 02/15/2017  . Dyslipidemia 02/15/2017  . Renal cell carcinoma of right kidney ([place]) 02/15/2017    Past Surgical History:  Procedure Laterality Date  . ANTERIOR APPROACH HEMI HIP ARTHROPLASTY Right 11/22/2020   Procedure: ANTERIOR APPROACH RIGHT HIP  HEMIARTHROPLASTY;  Surgeon: Leandrew Koyanagi, MD;  Location: [facility];  Service: Orthopedics;  Laterality: Right;  Needs RNFA  . AV FISTULA PLACEMENT Left 08/07/2020   Procedure: LEFT ARM ARTERIOVENOUS (AV) FISTULA CREATION;  Surgeon: [name], MD;  Location: [facility];  Service: Vascular;  Laterality: Left;  . BRONCHIAL BIOPSY  11/05/2020   Procedure: BRONCHIAL BIOPSIES;  Surgeon: Collene Gobble, MD;  Location: [facility] ENDOSCOPY;  Service: Cardiopulmonary;;  . BRONCHIAL BRUSHINGS  11/05/2020   Procedure: BRONCHIAL BRUSHINGS;  Surgeon: Collene Gobble, MD;  Location: [facility];  Service: Cardiopulmonary;;  . BRONCHIAL NEEDLE ASPIRATION BIOPSY  11/05/2020   Procedure: BRONCHIAL NEEDLE ASPIRATION BIOPSIES;  Surgeon: Collene Gobble, MD;  Location: [facility];  Service: Cardiopulmonary;;  . CATARACT EXTRACTION Bilateral    11/30 left /12/9 right  . ELECTROMAGNETIC NAVIGATION BROCHOSCOPY N/A 11/05/2020   Procedure: ELECTROMAGNETIC NAVIGATION BRONCHOSCOPY;  Surgeon: Collene Gobble, MD;  Location: [facility];  Service: Cardiopulmonary;  Laterality: N/A;  . ENDOBRONCHIAL ULTRASOUND N/A 11/05/2020   Procedure: ENDOBRONCHIAL ULTRASOUND;  Surgeon: Collene Gobble, MD;  Location[facility]COPY;  Service: Cardiopulmonary;  Laterality: N/A;  . FINE NEEDLE ASPIRATION  11/05/2020   Procedure: FINE NEEDLE ASPIRATION (FNA) LINEAR;  Surgeon: Collene Gobble, MD;  Location: [facility] ENDOSCOPY;  Service: Cardiopulmonary;;  . IR FLUORO GUIDE CV LINE RIGHT  07/27/2020  . IR THORACENTESIS ASP PLEURAL SPACE W/IMG GUIDE  10/24/2020  . IR THORACENTESIS ASP PLEURAL SPACE W/IMG GUIDE  10/25/2020  . IR THORACENTESIS ASP PLEURAL SPACE W/IMG GUIDE  10/31/2020  . IR US GUIDE VASC ACCESS RIGHT  07/27/2020  . ROBOT ASSISTED LAPAROSCOPIC NEPHRECTOMY Right 01/13/2017   Procedure: XI ROBOTIC ASSISTED LAPAROSCOPIC RADICAL NEPHRECTOMY WITH LYSIS OF ADHESION;  Surgeon: Alexis Frock, MD;  Location: [facility];  Service: Urology;  Laterality:  Right;  [place] Kitchen VIDEO BRONCHOSCOPY N/A 11/05/2020   Procedure: VIDEO BRONCHOSCOPY WITH FLUORO;  Surgeon: Collene Gobble, MD;  Location: [facility] ENDOSCOPY;  Service: Cardiopulmonary;  Laterality: N/A;     OB History   No obstetric history on file.     Family History  Problem Relation Age of Onset  . Hypertension Mother   . Pancreatic cancer Father   . Breast cancer Sister     Social History   Tobacco Use  . Smoking status: Former Smoker    Packs/day: 0.50    Years: 30.00    Pack years: 15.00    Types: Cigarettes    Quit date: 12/14/2000    Years since quitting: 20.0  . Smokeless tobacco: Never Used  Vaping Use  . Vaping Use: Never used  Substance Use Topics  . Alcohol use: Yes    Comment: occasional wine   . Drug use: No    Home Medications Prior to Admission medications   Medication Sig Start Date End Date Taking? Authorizing Provider  amLODipine (NORVASC) 10 MG tablet Take 1 tablet (10 mg total) by mouth daily. 04/19/20   Biagio Borg, MD  ascorbic acid (VITAMIN C) 500 MG tablet Take 500 mg by mouth 2 (two) times daily.  08/31/20  [provider]  Black Currant Seed Oil 500 MG CAPS Take 500 mg by mouth every morning.     [provider]  Calcium Carbonate-Vit D-Min (CALCIUM 1200 PO) Take 1,200 mg by mouth every morning.     [provider]  carvedilol (COREG) 12.5 MG tablet Take 1 tablet (12.5 mg total) by mouth 2 (two) times daily with a meal. 11/28/20   Regalado, Belkys A, MD  Cholecalciferol (VITAMIN D3) 50 MCG (2000 UT) capsule Take 2,000 Units by mouth every morning. 08/31/20   [provider]  dexamethasone (DECADRON) 4 MG tablet Take 1 tablet (4 mg total) by mouth every 8 (eight) hours. 11/28/20   Regalado, Belkys A, MD  docusate sodium (COLACE) 100 MG capsule Take 1 capsule (100 mg total) by mouth 2 (two) times daily. 11/28/20   Regalado, Belkys A, MD  enoxaparin (LOVENOX) 30 MG/0.3ML injection Inject 0.3 mLs (30 mg total) into the skin  daily for 14 days. 11/22/20 12/06/20  Leandrew Koyanagi, MD  Garlic 5732 MG CAPS Take 2,000 mg by mouth daily.     [provider]  Multiple Vitamin (MULTIVITAMIN WITH MINERALS) TABS tablet Take 1 tablet by mouth every morning.     [provider]  Omega-3 Fatty Acids (FISH OIL PO) Take 1 capsule by mouth every morning.     [provider]  ondansetron (ZOFRAN) 4 MG tablet Take 1 tablet (4 mg total) by mouth every 8 (eight) hours as needed for nausea or vomiting. 04/19/20   Biagio Borg, MD  oxyCODONE-acetaminophen (PERCOCET) 5-325 MG tablet Take 1-2 tablets by mouth every 8 (eight) hours as needed for severe pain. 11/22/20   Leandrew Koyanagi, MD  pantoprazole (PROTONIX) 40 MG tablet Take 1 tablet (40 mg total) by mouth daily. 11/28/20   Regalado, Belkys A, MD  polyethylene glycol (MIRALAX / GLYCOLAX) 17 g packet Take 17 g by mouth daily as needed for mild constipation. 09/12/20   Domenic Polite, MD  prochlorperazine (COMPAZINE) 10 MG tablet Take 1 tablet (10 mg total) by mouth every 6 (six) hours as needed. 12/05/20   Heilingoetter, Cassandra L, PA-C  rosuvastatin (CRESTOR) 10 MG tablet Take 1 tablet (10 mg total) by mouth daily. 09/26/20   Biagio Borg, MD  senna-docusate (SENOKOT-S) 8.6-50 MG tablet Take 1 tablet by mouth daily as needed for mild constipation. 09/12/20   Domenic Polite, MD  telmisartan (MICARDIS) 80 MG tablet Take 80 mg by mouth daily. 10/15/20   [provider]    Allergies    Patient has no known allergies.  Review of Systems   Review of Systems  Constitutional: Negative for chills and fever.  HENT: Negative for mouth sores.   Eyes: Negative for visual disturbance.  Respiratory: Negative for shortness of breath.   Cardiovascular: Negative for chest pain.  Gastrointestinal: Negative for abdominal pain, nausea and vomiting.  Genitourinary: Negative for pelvic pain and vaginal pain.       She is anuric   Musculoskeletal: Negative for back pain  and neck pain.  Skin: Negative for color change and rash.  Neurological: Positive for tremors. Negative for dizziness, seizures, syncope, facial asymmetry, speech difficulty, weakness, light-headedness, numbness and headaches.  Psychiatric/Behavioral: Negative for confusion.    Physical Exam Updated Vital Signs BP (!) 194/84   Pulse 92   Temp (!) 97.5 F (36.4 C) (Axillary)   Resp 13   SpO2 100%   Physical Exam Vitals and nursing note reviewed.  Constitutional:  General: She is not in acute distress.    Appearance: She is not ill-appearing, toxic-appearing or diaphoretic.  HENT:     Head: Normocephalic and atraumatic.  Eyes:     General: No scleral icterus.       Right eye: No discharge.        Left eye: No discharge.     Extraocular Movements: Extraocular movements intact.     Pupils: Pupils are equal, round, and reactive to light.  Cardiovascular:     Rate and Rhythm: Normal rate and regular rhythm.     Heart sounds: Normal heart sounds.  Pulmonary:     Effort: Pulmonary effort is normal. No respiratory distress.     Breath sounds: Normal breath sounds. No stridor. No wheezing, rhonchi or rales.  Abdominal:     Palpations: Abdomen is soft.     Tenderness: There is no abdominal tenderness.  Musculoskeletal:     Cervical back: Normal range of motion and neck supple. No deformity, rigidity, tenderness or bony tenderness.     Thoracic back: No deformity, tenderness or bony tenderness.     Lumbar back: No deformity, tenderness or bony tenderness.     Right hip: No deformity, lacerations, tenderness, bony tenderness or crepitus. Normal range of motion.     Left hip: No deformity, lacerations, tenderness, bony tenderness or crepitus. Normal range of motion.     Right upper leg: No swelling, edema, deformity, lacerations, tenderness or bony tenderness.     Left upper leg: No swelling, edema, deformity, lacerations, tenderness or bony tenderness.     Right lower leg: No  edema.     Left lower leg: No edema.     Comments: No pain on passive range of motion to bilateral lower leg extremities  Skin:    General: Skin is warm and dry.  Neurological:     General: No focal deficit present.     Mental Status: She is alert.     GCS: GCS eye subscore is 4. GCS verbal subscore is 5. GCS motor subscore is 6.     Cranial Nerves: No cranial nerve deficit or facial asymmetry.     Sensory: Sensation is intact.     Motor: No weakness, tremor, seizure activity or pronator drift.     Coordination: Finger-Nose-Finger Test normal.     Comments: CN II-XII intact; performed in supine position, +5 strength to bilateral upper extremities, +5 strength to dorsiflexion and plantarflexion, patient able to left both legs against gravity and hold each there for approximately 1 second   No tremor or shaking was observed to patient's right arm  Psychiatric:        Behavior: Behavior is cooperative.     ED Results / Procedures / Treatments   Labs (all labs ordered are listed, but only abnormal results are displayed) Labs Reviewed  BASIC METABOLIC PANEL - Abnormal; Notable for the following components:      Result Value   Sodium 129 (*)    Potassium 5.7 (*)    Chloride 88 (*)    CO2 19 (*)    Glucose, Bld 139 (*)    BUN 123 (*)    Creatinine, Ser 6.93 (*)    Calcium 8.3 (*)    GFR, Estimated 6 (*)    Anion gap 22 (*)    All other components within normal limits  CBC - Abnormal; Notable for the following components:   WBC 56.2 (*)    RBC 3.49 (*)  Hemoglobin 10.0 (*)    HCT 31.7 (*)    RDW 17.4 (*)    All other components within normal limits  CBG MONITORING, ED - Abnormal; Notable for the following components:   Glucose-Capillary 158 (*)    All other components within normal limits  SARS CORONAVIRUS 2 (TAT 6-24 HRS)  PROTIME-INR  AMMONIA  COMPREHENSIVE METABOLIC PANEL  CBC    EKG EKG Interpretation  Date/Time:  Tuesday December 17 2020 16:32:19  EST Ventricular Rate:  98 PR Interval:  116 QRS Duration: 66 QT Interval:  334 QTC Calculation: 426 R Axis:   14 Text Interpretation: Normal sinus rhythm Nonspecific ST abnormality No significant change since last tracing Confirmed by Blanchie Dessert [id number]) on 12/17/2020 6:01:52 PM   Radiology CT Head Wo Contrast  Result Date: 12/17/2020 CLINICAL DATA:  Mental status change EXAM: CT HEAD WITHOUT CONTRAST TECHNIQUE: Contiguous axial images were obtained from the base of the skull through the vertex without intravenous contrast. COMPARISON:  MRI 11/26/2020 FINDINGS: Brain: No acute interval hemorrhage. Stable ventricle size. Grossly similar edema within the left parietal white matter. Slightly dense metastatic lesions are again noted. 1.4 x 1.3 cm left parietal mass, series 4, image number 26. Small 7 mm hyperdense mass within the left frontal lobe, series 4, image number 25. Hyperdense mass within the right lateral ventricle measuring 1.6 x 2.3 cm. Approximate 14 mm left cerebellar lesion vaguely visualized. The other metastatic lesions on MRI are better seen on MRI. No significant midline shift. Vascular: No hyperdense vessels.  Carotid vascular calcification. Skull: Normal. Negative for fracture or focal lesion. Sinuses/Orbits: No acute finding. Other: None IMPRESSION: 1. No definite acute interval change as compared with MRI from December 2021 allowing for limitations of inter modality comparison. Intracranial metastatic disease with grossly stable edema within the left parietal white matter. Only some of the metastatic lesions are appreciated on this non contrasted CT. 2. Mild chronic small vessel ischemic change of the white matter. Electronically Signed   By: Donavan Foil M.D.   On: 12/17/2020 19:41   DG Chest Port 1 View  Result Date: 12/17/2020 CLINICAL DATA:  Altered mental status, leg swelling EXAM: PORTABLE CHEST 1 VIEW COMPARISON:  November 20, 2020 FINDINGS: Bilateral layering pleural  effusions with adjacent atelectasis. Probable mild pulmonary edema. No pneumothorax. Stable cardiomediastinal contours. Right tunneled line is unchanged. IMPRESSION: Bilateral layering pleural effusions with adjacent atelectasis. Probable mild pulmonary edema. Electronically Signed   By: Macy Mis M.D.   On: 12/17/2020 19:46    Procedures Procedures (including critical care time)  Medications Ordered in ED Medications  levETIRAcetam (KEPPRA) 3,110 mg in sodium chloride 0.9 % 250 mL IVPB (has no administration in time range)  amLODipine (NORVASC) tablet 10 mg (has no administration in time range)  carvedilol (COREG) tablet 12.5 mg (has no administration in time range)  rosuvastatin (CRESTOR) tablet 10 mg (has no administration in time range)  irbesartan (AVAPRO) tablet 300 mg (has no administration in time range)  prochlorperazine (COMPAZINE) tablet 10 mg (has no administration in time range)  dexamethasone (DECADRON) tablet 4 mg (has no administration in time range)  docusate sodium (COLACE) capsule 100 mg (has no administration in time range)  ondansetron (ZOFRAN) tablet 4 mg (has no administration in time range)  pantoprazole (PROTONIX) EC tablet 40 mg (has no administration in time range)  polyethylene glycol (MIRALAX / GLYCOLAX) packet 17 g (has no administration in time range)  heparin injection 5,000 Units (has no administration in time  range)  sodium chloride flush (NS) 0.9 % injection 3 mL (has no administration in time range)  levETIRAcetam (KEPPRA) 250 mg in sodium chloride 0.9 % 100 mL IVPB (has no administration in time range)  acetaminophen (TYLENOL) tablet 650 mg (has no administration in time range)    Or  acetaminophen (TYLENOL) suppository 650 mg (has no administration in time range)    ED Course  I have reviewed the triage vital signs and the nursing notes.  Pertinent labs & imaging results that were available during my care of the patient were reviewed by me and  considered in my medical decision making (see chart for details).  Clinical Course as of 12/17/20 2355  Tue Dec 17, 2020  1859 At 1850, went into patient's room and found her to have an altered mental status, would not respond verbally however would track appropriately.  This represents an acute change in her mental status.  Attending Dr. Maryan Rued was notified and she came directly to patient's bedside.  CBG was found to be 158.  Oxygen saturations 99% on 2 L.  Noncontrast head CT was ordered [PB]    Clinical Course User Index [PB] Dyann Ruddle   MDM Rules/Calculators/A&P                         Alert 80 year old female in no acute distress, nontoxic appearing.  Patient is on dialysis receives dialysis Monday Wednesday Friday; her last dialysis appointment was 12/13/20. 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    Presents with chief complaint of left groin pain and right arm shaking.  Per triage note patient was alert and oriented.  Upon entering room patient was noted to have altered mental status, Dr. Maryan Rued notified and came directly to patient's bedside.  CBG was found to be 158, oxygen saturation 99% on 2LPM, vital signs were stable.  Patient sent for noncontrast head CT.    Head CT showed no definite acute interval change as compared with MRI 21; any oligometastatic disease with grossly stable edema within the left parietal white matter; only some of the metastatic lesions are appreciated on this noncontrast CT.  Upon return to her room patient was alert, able to answer questions however still had some aphasia present.    On repeat examination patient's aphasia had resolved she was found to be alert and oriented x3.  Patient's acute altered mental status likely due to postictal state following a seizure.  Will consult neurology.    EKG showed Normal sinus rhythm Nonspecific ST abnormality  No significant change since last tracing.  Normality seen on patient's BNP likely due to missing dialysis appointment.  No changes related to hyperkalemia were seen on patient's EKG.     Patient has leukocytosis at 56.2 this is increased from previous lab result obtained 7 days prior which was 39.3.  Likely this is elevated from her seizure.  Remainder of CBC is stable and improved from previous lab results.  Spoke with Dr. Leonel Ramsay, recommended a loading dose of Keppra.  He will come to assess the patient.  The access could not be obtained by RN or IV team.  22:18 spoke with Dr. Posey Pronto from nephrology, he gave permission to access dialysis catheter for medication administration.  He was also informed that patient will be admitted and likely need dialysis tomorrow.  RN reported that she required replacement by Dr. Posey Pronto  allowing for every blood draw and med administration before patient's dialysis catheter could be accessed.    23:20 spoke with attending from [facility] hospitalist, Dr. Trilby Drummer; will see the patient and admit.   Dr. Posey Pronto was contacted and agreed to place the order to allow for medication administration and blood draw from patient's dialysis catheter.  Patient's nurse was notified.  Patient continues to be alert and oriented without focal neurological deficits on repeat exam.  Spoke with patient about admission for observation, patient verbalized understanding and agreed with plan.  Patient was discussed with and evaluated by Dr. Maryan Rued.   Final Clinical Impression(s) / ED Diagnoses Final diagnoses:  Seizure [facility])    Rx / DC Orders ED Discharge Orders    None       Dyann Ruddle 12/18/20 0011    Blanchie Dessert, MD 12/18/20 1536

## 2020-12-17 NOTE — ED Notes (Signed)
Pt mouth breathing, notably sweaty. Unable to get an oral temp.   Brief changed and purewick applied.

## 2020-12-17 NOTE — ED Provider Notes (Incomplete)
[facility] EMERGENCY DEPARTMENT Provider Note   CSN: [id number] Arrival date & time: 12/17/20  1550     History Chief Complaint  Patient presents with  . Leg Pain  . Arm Shaking    Cassandra Dyer is a 80 y.o. 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  Patient reports that for the last 2 to 3 days she stated having pain in her left groin.  She reports that the pain started while she was walking but denies any injury or fall.  She ports resolution of pain when laying down at rest.  Patient denies previous episodes of left leg pain.  Patient denies any swelling or erythema.  Patient also complains of right arm shaking.  She reports this has been going on for the last 2 days.  Reports that she had a similar episode of arm shaking in the past but does not have further details.    Patient denies any extremity numbness tingling, extremity weakness, seizures or history of seizures, facial asymmetry, slurred speech, fevers, chills, headaches, lightheadedness, dizziness, or syncope.  Patient reports last dialysis appointment was Friday 01/14/20.  Per chart review last radiation for metastatic renal lesion on 12/10/20.  Patient reports she not started chemotherapy.     HPI     Past Medical History:  Diagnosis Date  . Anemia   . ESRD on hemodialysis ([place])   . Hepatitis    hx of hep C - 10-20 years ago   . History of blood transfusion   . History of kidney cancer   . Hyperlipidemia   . Hypertension    not taken b/p med in 2-3 years md deceased never went to another md   . Renal cell carcinoma ([place]) 2018  . Stage 4 lung cancer ([place])    new - due to start therapy with next appt on 12/14    Patient Active Problem List   Diagnosis Date Noted  . Seizures ([place]) 12/17/2020  . Hyponatremia 12/17/2020  . Brain metastases ([place])  12/11/2020  . Anemia in chronic kidney disease 12/05/2020  . Encounter for antineoplastic immunotherapy 12/05/2020  . Closed displaced fracture of right femoral neck ([place]) 11/22/2020  . Noncompliance of patient with renal dialysis ([place]) 11/20/2020  . Adenocarcinoma of left lung, stage 4 ([place]) 11/16/2020  . Encounter for antineoplastic chemotherapy 11/16/2020  . Goals of care, counseling/discussion 11/16/2020  . Mass of upper lobe of left lung 11/05/2020  . Mediastinal adenopathy 11/05/2020  . Pulmonary nodules 11/05/2020  . Acute respiratory failure with hypoxia ([place]) 10/23/2020  . Pulmonary edema 10/22/2020  . Acute pulmonary edema ([place]) 10/22/2020  . Educated about COVID-19 virus infection 10/10/2020  . Chronic hypoxemic respiratory failure ([place]) 09/26/2020  . COPD (chronic obstructive pulmonary disease) ([place]) 09/26/2020  . Nausea vomiting and diarrhea 09/10/2020  . ESRD (end stage renal disease) ([place])   . Volume overload   . Cough 08/04/2020  . Thrombocytopenia ([place]) 08/02/2020  . Anemia 08/02/2020  . Hyperkalemia 07/27/2020  . Hyperlipidemia 07/27/2020  . Metabolic acidosis 51/01/5851  . CKD (chronic kidney disease) 05/23/2020  . Solitary kidney, acquired 05/23/2020  . Fever 04/26/2020  . Acute hypoxemic respiratory failure ([place]) 04/19/2020  . Diarrhea 04/19/2020  . Nausea & vomiting 04/19/2020  . Pneumonia 04/09/2020  . Acute kidney injury (AKI) with acute tubular necrosis (ATN) ([place]) 04/09/2020  . Hypertensive urgency 04/09/2020  .  History of hepatitis C 04/09/2020  . Sepsis ([place]) 04/08/2020  . Venous insufficiency 12/28/2019  . Hyperglycemia 06/29/2019  . Osteoporosis 02/20/2018  . Essential hypertension 02/15/2017  . Dyslipidemia 02/15/2017  . Renal cell carcinoma of right kidney ([place]) 02/15/2017    Past Surgical History:  Procedure Laterality Date  . ANTERIOR APPROACH HEMI HIP ARTHROPLASTY Right 11/22/2020   Procedure: ANTERIOR APPROACH RIGHT HIP  HEMIARTHROPLASTY;  Surgeon: Leandrew Koyanagi, MD;  Location: [facility];  Service: Orthopedics;  Laterality: Right;  Needs RNFA  . AV FISTULA PLACEMENT Left 08/07/2020   Procedure: LEFT ARM ARTERIOVENOUS (AV) FISTULA CREATION;  Surgeon: [name], MD;  Location: [facility];  Service: Vascular;  Laterality: Left;  . BRONCHIAL BIOPSY  11/05/2020   Procedure: BRONCHIAL BIOPSIES;  Surgeon: Collene Gobble, MD;  Location: [facility] ENDOSCOPY;  Service: Cardiopulmonary;;  . BRONCHIAL BRUSHINGS  11/05/2020   Procedure: BRONCHIAL BRUSHINGS;  Surgeon: Collene Gobble, MD;  Location: [facility];  Service: Cardiopulmonary;;  . BRONCHIAL NEEDLE ASPIRATION BIOPSY  11/05/2020   Procedure: BRONCHIAL NEEDLE ASPIRATION BIOPSIES;  Surgeon: Collene Gobble, MD;  Location: [facility];  Service: Cardiopulmonary;;  . CATARACT EXTRACTION Bilateral    11/30 left /12/9 right  . ELECTROMAGNETIC NAVIGATION BROCHOSCOPY N/A 11/05/2020   Procedure: ELECTROMAGNETIC NAVIGATION BRONCHOSCOPY;  Surgeon: Collene Gobble, MD;  Location: [facility];  Service: Cardiopulmonary;  Laterality: N/A;  . ENDOBRONCHIAL ULTRASOUND N/A 11/05/2020   Procedure: ENDOBRONCHIAL ULTRASOUND;  Surgeon: Collene Gobble, MD;  Location: [facility] END[facility]COPY;  Service: Cardiopulmonary;  Laterality: N/A;  . FINE NEEDLE ASPIRATION  11/05/2020   Procedure: FINE NEEDLE ASPIRATION (FNA) LINEAR;  Surgeon: Collene Gobble, MD;  Location: [facility]OSCOPY;  Service: Cardiopulmonary;;  . IR FLUORO GUIDE CV LINE RIGHT  07/27/2020  . IR THORACENTESIS ASP PLEURAL SPACE W/IMG GUIDE  10/24/2020  . IR THORACENTESIS ASP PLEURAL SPACE W/IMG GUIDE  10/25/2020  . IR THORACENTESIS ASP PLEURAL SPACE W/IMG GUIDE  10/31/2020  . IR US GUIDE VASC ACCESS RIGHT  07/27/2020  . ROBOT ASSISTED LAPAROSCOPIC NEPHRECTOMY Right 01/13/2017   Procedure: XI ROBOTIC ASSISTED LAPAROSCOPIC RADICAL NEPHRECTOMY WITH LYSIS OF ADHESION;  Surgeon: Alexis Frock, MD;  Location: [facility];  Service: Urology;  Laterality:  Right;  Marland Kitchen VIDEO BRONCHOSCOPY N/A 11/05/2020   Procedure: VIDEO BRONCHOSCOPY WITH FLUORO;  Surgeon: Collene Gobble, MD;  Location: [facility] ENDOSCOPY;  Service: Cardiopulmonary;  Laterality: N/A;     OB History   No obstetric history on file.     Family History  Problem Relation Age of Onset  . Hypertension Mother   . Pancreatic cancer Father   . Breast cancer Sister     Social History   Tobacco Use  . Smoking status: Former Smoker    Packs/day: 0.50    Years: 30.00    Pack years: 15.00    Types: Cigarettes    Quit date: 12/14/2000    Years since quitting: 20.0  . Smokeless tobacco: Never Used  Vaping Use  . Vaping Use: Never used  Substance Use Topics  . Alcohol use: Yes    Comment: occasional wine   . Drug use: No    Home Medications Prior to Admission medications   Medication Sig Start Date End Date Taking? Authorizing Provider  amLODipine (NORVASC) 10 MG tablet Take 1 tablet (10 mg total) by mouth daily. 04/19/20   Biagio Borg, MD  ascorbic acid (VITAMIN C) 500 MG tablet Take 500 mg by mouth 2 (two) times daily.  08/31/20  [provider]  Black Currant Seed Oil 500 MG CAPS Take 500 mg by mouth every morning.     [provider]  Calcium Carbonate-Vit D-Min (CALCIUM 1200 PO) Take 1,200 mg by mouth every morning.     [provider]  carvedilol (COREG) 12.5 MG tablet Take 1 tablet (12.5 mg total) by mouth 2 (two) times daily with a meal. 11/28/20   Regalado, Belkys A, MD  Cholecalciferol (VITAMIN D3) 50 MCG (2000 UT) capsule Take 2,000 Units by mouth every morning. 08/31/20   [provider]  dexamethasone (DECADRON) 4 MG tablet Take 1 tablet (4 mg total) by mouth every 8 (eight) hours. 11/28/20   Regalado, Belkys A, MD  docusate sodium (COLACE) 100 MG capsule Take 1 capsule (100 mg total) by mouth 2 (two) times daily. 11/28/20   Regalado, Belkys A, MD  enoxaparin (LOVENOX) 30 MG/0.3ML injection Inject 0.3 mLs (30 mg total) into the skin  daily for 14 days. 11/22/20 12/06/20  Leandrew Koyanagi, MD  Garlic 7619 MG CAPS Take 2,000 mg by mouth daily.     [provider]  Multiple Vitamin (MULTIVITAMIN WITH MINERALS) TABS tablet Take 1 tablet by mouth every morning.     [provider]  Omega-3 Fatty Acids (FISH OIL PO) Take 1 capsule by mouth every morning.     [provider]  ondansetron (ZOFRAN) 4 MG tablet Take 1 tablet (4 mg total) by mouth every 8 (eight) hours as needed for nausea or vomiting. 04/19/20   Biagio Borg, MD  oxyCODONE-acetaminophen (PERCOCET) 5-325 MG tablet Take 1-2 tablets by mouth every 8 (eight) hours as needed for severe pain. 11/22/20   Leandrew Koyanagi, MD  pantoprazole (PROTONIX) 40 MG tablet Take 1 tablet (40 mg total) by mouth daily. 11/28/20   Regalado, Belkys A, MD  polyethylene glycol (MIRALAX / GLYCOLAX) 17 g packet Take 17 g by mouth daily as needed for mild constipation. 09/12/20   Domenic Polite, MD  prochlorperazine (COMPAZINE) 10 MG tablet Take 1 tablet (10 mg total) by mouth every 6 (six) hours as needed. 12/05/20   Heilingoetter, Cassandra L, PA-C  rosuvastatin (CRESTOR) 10 MG tablet Take 1 tablet (10 mg total) by mouth daily. 09/26/20   Biagio Borg, MD  senna-docusate (SENOKOT-S) 8.6-50 MG tablet Take 1 tablet by mouth daily as needed for mild constipation. 09/12/20   Domenic Polite, MD  telmisartan (MICARDIS) 80 MG tablet Take 80 mg by mouth daily. 10/15/20   [provider]    Allergies    Patient has no known allergies.  Review of Systems   Review of Systems  Constitutional: Negative for chills and fever.  HENT: Negative for mouth sores.   Eyes: Negative for visual disturbance.  Respiratory: Negative for shortness of breath.   Cardiovascular: Negative for chest pain.  Gastrointestinal: Negative for abdominal pain, nausea and vomiting.  Genitourinary: Negative for pelvic pain and vaginal pain.       She is anuric   Musculoskeletal: Negative for back pain  and neck pain.  Skin: Negative for color change and rash.  Neurological: Positive for tremors. Negative for dizziness, seizures, syncope, facial asymmetry, speech difficulty, weakness, light-headedness, numbness and headaches.  Psychiatric/Behavioral: Negative for confusion.    Physical Exam Updated Vital Signs BP (!) 194/84   Pulse 92   Temp (!) 97.5 F (36.4 C) (Axillary)   Resp 13   SpO2 100%   Physical Exam Vitals and nursing note reviewed.  Constitutional:  General: She is not in acute distress.    Appearance: She is not ill-appearing, toxic-appearing or diaphoretic.  HENT:     Head: Normocephalic and atraumatic.  Eyes:     General: No scleral icterus.       Right eye: No discharge.        Left eye: No discharge.     Extraocular Movements: Extraocular movements intact.     Pupils: Pupils are equal, round, and reactive to light.  Cardiovascular:     Rate and Rhythm: Normal rate.  Pulmonary:     Effort: Pulmonary effort is normal.  Abdominal:     Palpations: Abdomen is soft.     Tenderness: There is no abdominal tenderness.  Musculoskeletal:     Cervical back: Normal range of motion and neck supple. No deformity, rigidity, tenderness or bony tenderness.     Thoracic back: No deformity, tenderness or bony tenderness.     Lumbar back: No deformity, tenderness or bony tenderness.     Comments: No pain on passive range of motion to bilateral lower leg extremities  Skin:    General: Skin is warm and dry.  Neurological:     General: No focal deficit present.     Mental Status: She is alert.     GCS: GCS eye subscore is 4. GCS verbal subscore is 5. GCS motor subscore is 6.     Cranial Nerves: No cranial nerve deficit or facial asymmetry.     Sensory: Sensation is intact.     Motor: No weakness, tremor or seizure activity.     Coordination: Romberg sign negative. Finger-Nose-Finger Test normal.     Gait: Gait is intact. Gait normal.     Comments: CN II-XII intact,  equal grip strength, +5 strength to bilateral upper and lower extremities   CN II-XII intact; performed in supine position due to ***, +5 strength to bilateral upper extremities, +5 strength to dorsiflexion and plantarflexion, patient able to left both legs against gravity and hold each there without difficulty   Psychiatric:        Behavior: Behavior is cooperative.     ED Results / Procedures / Treatments   Labs (all labs ordered are listed, but only abnormal results are displayed) Labs Reviewed  BASIC METABOLIC PANEL - Abnormal; Notable for the following components:      Result Value   Sodium 129 (*)    Potassium 5.7 (*)    Chloride 88 (*)    CO2 19 (*)    Glucose, Bld 139 (*)    BUN 123 (*)    Creatinine, Ser 6.93 (*)    Calcium 8.3 (*)    GFR, Estimated 6 (*)    Anion gap 22 (*)    All other components within normal limits  CBC - Abnormal; Notable for the following components:   WBC 56.2 (*)    RBC 3.49 (*)    Hemoglobin 10.0 (*)    HCT 31.7 (*)    RDW 17.4 (*)    All other components within normal limits  CBG MONITORING, ED - Abnormal; Notable for the following components:   Glucose-Capillary 158 (*)    All other components within normal limits  SARS CORONAVIRUS 2 (TAT 6-24 HRS)  PROTIME-INR  AMMONIA  COMPREHENSIVE METABOLIC PANEL  CBC    EKG EKG Interpretation  Date/Time:  Tuesday December 17 2020 16:32:19 EST Ventricular Rate:  98 PR Interval:  116 QRS Duration: 66 QT Interval:  334 QTC Calculation: 426  R Axis:   14 Text Interpretation: Normal sinus rhythm Nonspecific ST abnormality No significant change since last tracing Confirmed by Blanchie Dessert [phone]) on 12/17/2020 6:01:52 PM   Radiology CT Head Wo Contrast  Result Date: 12/17/2020 CLINICAL DATA:  Mental status change EXAM: CT HEAD WITHOUT CONTRAST TECHNIQUE: Contiguous axial images were obtained from the base of the skull through the vertex without intravenous contrast. COMPARISON:  MRI  11/26/2020 FINDINGS: Brain: No acute interval hemorrhage. Stable ventricle size. Grossly similar edema within the left parietal white matter. Slightly dense metastatic lesions are again noted. 1.4 x 1.3 cm left parietal mass, series 4, image number 26. Small 7 mm hyperdense mass within the left frontal lobe, series 4, image number 25. Hyperdense mass within the right lateral ventricle measuring 1.6 x 2.3 cm. Approximate 14 mm left cerebellar lesion vaguely visualized. The other metastatic lesions on MRI are better seen on MRI. No significant midline shift. Vascular: No hyperdense vessels.  Carotid vascular calcification. Skull: Normal. Negative for fracture or focal lesion. Sinuses/Orbits: No acute finding. Other: None IMPRESSION: 1. No definite acute interval change as compared with MRI from December 2021 allowing for limitations of inter modality comparison. Intracranial metastatic disease with grossly stable edema within the left parietal white matter. Only some of the metastatic lesions are appreciated on this non contrasted CT. 2. Mild chronic small vessel ischemic change of the white matter. Electronically Signed   By: Donavan Foil M.D.   On: 12/17/2020 19:41   DG Chest Port 1 View  Result Date: 12/17/2020 CLINICAL DATA:  Altered mental status, leg swelling EXAM: PORTABLE CHEST 1 VIEW COMPARISON:  November 20, 2020 FINDINGS: Bilateral layering pleural effusions with adjacent atelectasis. Probable mild pulmonary edema. No pneumothorax. Stable cardiomediastinal contours. Right tunneled line is unchanged. IMPRESSION: Bilateral layering pleural effusions with adjacent atelectasis. Probable mild pulmonary edema. Electronically Signed   By: Macy Mis M.D.   On: 12/17/2020 19:46    Procedures Procedures (including critical care time)  Medications Ordered in ED Medications  levETIRAcetam (KEPPRA) 3,110 mg in sodium chloride 0.9 % 250 mL IVPB (has no administration in time range)  amLODipine (NORVASC)  tablet 10 mg (has no administration in time range)  carvedilol (COREG) tablet 12.5 mg (has no administration in time range)  rosuvastatin (CRESTOR) tablet 10 mg (has no administration in time range)  irbesartan (AVAPRO) tablet 300 mg (has no administration in time range)  prochlorperazine (COMPAZINE) tablet 10 mg (has no administration in time range)  dexamethasone (DECADRON) tablet 4 mg (has no administration in time range)  docusate sodium (COLACE) capsule 100 mg (has no administration in time range)  ondansetron (ZOFRAN) tablet 4 mg (has no administration in time range)  pantoprazole (PROTONIX) EC tablet 40 mg (has no administration in time range)  polyethylene glycol (MIRALAX / GLYCOLAX) packet 17 g (has no administration in time range)  heparin injection 5,000 Units (has no administration in time range)  sodium chloride flush (NS) 0.9 % injection 3 mL (has no administration in time range)  levETIRAcetam (KEPPRA) 250 mg in sodium chloride 0.9 % 100 mL IVPB (has no administration in time range)  acetaminophen (TYLENOL) tablet 650 mg (has no administration in time range)    Or  acetaminophen (TYLENOL) suppository 650 mg (has no administration in time range)    ED Course  I have reviewed the triage vital signs and the nursing notes.  Pertinent labs & imaging results that were available during my care of the patient were reviewed by  me and considered in my medical decision making (see chart for details).  Clinical Course as of 12/17/20 2355  Tue Dec 17, 2020  1859 At 1850, went into patient's room and found her to have an altered mental status, would not respond verbally however would track appropriately.  This represents an acute change in her mental status.  Attending Dr. Maryan Rued was notified and she came directly to patient's bedside.  CBG was found to be 158.  Oxygen saturations 99% on 2 L.  Noncontrast head CT was ordered [PB]    Clinical Course User Index [PB] Dyann Ruddle   MDM Rules/Calculators/A&P                         Alert 80 year old female in no acute distress, nontoxic appearing.  Patient is on dialysis receives dialysis Monday Wednesday Friday; her last dialysis appointment was 12/13/20. 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    Presents with chief complaint of left groin pain and right arm shaking.  Per triage note patient was alert and oriented.  Upon entering room patient was noted to have altered mental status, Dr. Maryan Rued notified and came directly to patient's bedside.  CBG was found to be 158, oxygen saturation 99% on 2LPM, vital signs were stable.  Patient sent for noncontrast head CT.    Head CT showed no definite acute interval change as compared with MRI 21; any oligometastatic disease with grossly stable edema within the left parietal white matter; only some of the metastatic lesions are appreciated on this noncontrast CT.  Upon return to her room patient was alert, able to answer questions however still had some aphasia present.    On repeat examination patient's aphasia had resolved she was found to be alert and oriented x3.  Patient's acute altered mental status likely due to postictal state following a seizure.  Will consult neurology.    EKG showed Normal sinus rhythm Nonspecific ST abnormality No significant change since last tracing.  Normality seen on patient's BNP likely due to missing dialysis appointment.  No changes related to hyperkalemia were seen on patient's EKG.     Patient has leukocytosis at 56.2 this is increased from previous lab result obtained 7 days prior which was 39.3.  Likely this is elevated from her seizure.  Remainder of CBC is stable and improved from previous lab results.  Spoke with Dr. Leonel Ramsay, recommended a loading dose of Keppra.  He will come to assess the patient.  The access could not be obtained by RN or IV  team.  22:18 spoke with Dr. Posey Pronto from nephrology, he gave permission to access dialysis catheter for medication administration.  He was also informed that patient will be admitted and likely need dialysis tomorrow.  RN reported that she required replacement by Dr. Posey Pronto allowing for every blood draw and med administration before patient's dialysis catheter could be accessed.    23:20 spoke with attending from [facility], Dr. Trilby Drummer; will see the patient and admit.   Dr. Posey Pronto was contacted and agreed to place the order to allow for medication administration and blood draw from patient's dialysis catheter.   Patient continues to be alert and oriented without focal neurological deficits on repeat exam.  Spoke with patient about admission for observation, patient verbalized understanding and agreed with plan.  Patient was discussed with and evaluated by Dr. Maryan Rued.  Final Clinical Impression(s) / ED Diagnoses Final diagnoses:  Seizure ([place])    Rx / DC Orders ED Discharge Orders    None

## 2020-12-17 NOTE — ED Notes (Signed)
3 stage 1 sacral sores noted upon initial assessment of pt. Pt confused, cannot give me a reason why she came to hospital. Oriented to name only.

## 2020-12-18 ENCOUNTER — Other Ambulatory Visit: Payer: Medicare Other

## 2020-12-18 ENCOUNTER — Ambulatory Visit: Payer: Medicare Other | Admitting: Physician Assistant

## 2020-12-18 ENCOUNTER — Ambulatory Visit: Payer: Medicare Other

## 2020-12-18 ENCOUNTER — Observation Stay (HOSPITAL_COMMUNITY): Payer: Medicare Other

## 2020-12-18 DIAGNOSIS — R569 Unspecified convulsions: Secondary | ICD-10-CM | POA: Diagnosis present

## 2020-12-18 DIAGNOSIS — Z96641 Presence of right artificial hip joint: Secondary | ICD-10-CM | POA: Diagnosis present

## 2020-12-18 DIAGNOSIS — D72829 Elevated white blood cell count, unspecified: Secondary | ICD-10-CM | POA: Diagnosis present

## 2020-12-18 DIAGNOSIS — N186 End stage renal disease: Secondary | ICD-10-CM | POA: Diagnosis present

## 2020-12-18 DIAGNOSIS — E871 Hypo-osmolality and hyponatremia: Secondary | ICD-10-CM | POA: Diagnosis present

## 2020-12-18 DIAGNOSIS — Z20822 Contact with and (suspected) exposure to covid-19: Secondary | ICD-10-CM | POA: Diagnosis present

## 2020-12-18 DIAGNOSIS — Z66 Do not resuscitate: Secondary | ICD-10-CM | POA: Diagnosis present

## 2020-12-18 DIAGNOSIS — G936 Cerebral edema: Secondary | ICD-10-CM | POA: Diagnosis present

## 2020-12-18 DIAGNOSIS — N2581 Secondary hyperparathyroidism of renal origin: Secondary | ICD-10-CM | POA: Diagnosis present

## 2020-12-18 DIAGNOSIS — E785 Hyperlipidemia, unspecified: Secondary | ICD-10-CM | POA: Diagnosis present

## 2020-12-18 DIAGNOSIS — E875 Hyperkalemia: Secondary | ICD-10-CM | POA: Diagnosis present

## 2020-12-18 DIAGNOSIS — D631 Anemia in chronic kidney disease: Secondary | ICD-10-CM | POA: Diagnosis present

## 2020-12-18 DIAGNOSIS — R29810 Facial weakness: Secondary | ICD-10-CM | POA: Diagnosis present

## 2020-12-18 DIAGNOSIS — Z85528 Personal history of other malignant neoplasm of kidney: Secondary | ICD-10-CM | POA: Diagnosis not present

## 2020-12-18 DIAGNOSIS — C7931 Secondary malignant neoplasm of brain: Secondary | ICD-10-CM | POA: Diagnosis present

## 2020-12-18 DIAGNOSIS — T380X5A Adverse effect of glucocorticoids and synthetic analogues, initial encounter: Secondary | ICD-10-CM | POA: Diagnosis present

## 2020-12-18 DIAGNOSIS — I120 Hypertensive chronic kidney disease with stage 5 chronic kidney disease or end stage renal disease: Secondary | ICD-10-CM | POA: Diagnosis present

## 2020-12-18 DIAGNOSIS — Z992 Dependence on renal dialysis: Secondary | ICD-10-CM | POA: Diagnosis not present

## 2020-12-18 DIAGNOSIS — L89322 Pressure ulcer of left buttock, stage 2: Secondary | ICD-10-CM | POA: Diagnosis present

## 2020-12-18 DIAGNOSIS — E8770 Fluid overload, unspecified: Secondary | ICD-10-CM | POA: Diagnosis present

## 2020-12-18 DIAGNOSIS — C3492 Malignant neoplasm of unspecified part of left bronchus or lung: Secondary | ICD-10-CM | POA: Diagnosis present

## 2020-12-18 DIAGNOSIS — L89312 Pressure ulcer of right buttock, stage 2: Secondary | ICD-10-CM | POA: Diagnosis present

## 2020-12-18 DIAGNOSIS — M898X9 Other specified disorders of bone, unspecified site: Secondary | ICD-10-CM | POA: Diagnosis present

## 2020-12-18 DIAGNOSIS — E872 Acidosis: Secondary | ICD-10-CM | POA: Diagnosis present

## 2020-12-18 LAB — CBC
HCT: 24.2 % — ABNORMAL LOW (ref 36.0–46.0)
Hemoglobin: 8.1 g/dL — ABNORMAL LOW (ref 12.0–15.0)
MCH: 28.7 pg (ref 26.0–34.0)
MCHC: 33.5 g/dL (ref 30.0–36.0)
MCV: 85.8 fL (ref 80.0–100.0)
Platelets: 171 10*3/uL (ref 150–400)
RBC: 2.82 MIL/uL — ABNORMAL LOW (ref 3.87–5.11)
RDW: 17.2 % — ABNORMAL HIGH (ref 11.5–15.5)
WBC: 41.4 10*3/uL — ABNORMAL HIGH (ref 4.0–10.5)
nRBC: 0 % (ref 0.0–0.2)

## 2020-12-18 LAB — COMPREHENSIVE METABOLIC PANEL
ALT: 22 U/L (ref 0–44)
AST: 20 U/L (ref 15–41)
Albumin: 2 g/dL — ABNORMAL LOW (ref 3.5–5.0)
Alkaline Phosphatase: 151 U/L — ABNORMAL HIGH (ref 38–126)
Anion gap: 20 — ABNORMAL HIGH (ref 5–15)
BUN: 132 mg/dL — ABNORMAL HIGH (ref 8–23)
CO2: 24 mmol/L (ref 22–32)
Calcium: 8.2 mg/dL — ABNORMAL LOW (ref 8.9–10.3)
Chloride: 87 mmol/L — ABNORMAL LOW (ref 98–111)
Creatinine, Ser: 7.35 mg/dL — ABNORMAL HIGH (ref 0.44–1.00)
GFR, Estimated: 5 mL/min — ABNORMAL LOW (ref >60)
Glucose, Bld: 147 mg/dL — ABNORMAL HIGH (ref 70–99)
Potassium: 5.1 mmol/L (ref 3.5–5.1)
Sodium: 131 mmol/L — ABNORMAL LOW (ref 135–145)
Total Bilirubin: 1.4 mg/dL — ABNORMAL HIGH (ref 0.3–1.2)
Total Protein: 4.7 g/dL — ABNORMAL LOW (ref 6.5–8.1)

## 2020-12-18 LAB — AMMONIA: Ammonia: 23 umol/L (ref 9–35)

## 2020-12-18 LAB — PROTIME-INR
INR: 1.2 (ref 0.8–1.2)
Prothrombin Time: 15.1 seconds (ref 11.4–15.2)

## 2020-12-18 LAB — SARS CORONAVIRUS 2 (TAT 6-24 HRS): SARS Coronavirus 2: NEGATIVE

## 2020-12-18 MED ORDER — PROSOURCE PLUS PO LIQD
30.0000 mL | Freq: Two times a day (BID) | ORAL | Status: DC
  Administered 2020-12-19 – 2020-12-23 (×8): 30 mL via ORAL
  Filled 2020-12-18 (×8): qty 30

## 2020-12-18 MED ORDER — SODIUM CHLORIDE 0.9% FLUSH: 10.0000 mL | INTRAVENOUS | Status: DC | PRN

## 2020-12-18 MED ORDER — LEVETIRACETAM IN NACL 500 MG/100ML IV SOLN
500.0000 mg | INTRAVENOUS | Status: DC
  Filled 2020-12-18 (×4): qty 100

## 2020-12-18 MED FILL — Fosaprepitant Dimeglumine For IV Infusion 150 MG (Base Eq): INTRAVENOUS | Qty: 5 | Status: AC

## 2020-12-18 MED FILL — Dexamethasone Sodium Phosphate Inj 100 MG/10ML: INTRAMUSCULAR | Qty: 1 | Status: AC

## 2020-12-18 NOTE — Consult Note (Incomplete)
Neurology Consultation Reason for Consult: *** Referring Physician: ***  CC: ***  History is obtained from:***  HPI: Cassandra Dyer is a 80 y.o. female ***   LKW: *** tpa given?: no, *** Premorbid modified rankin scale: *** ICH Score: ***    ROS: A 14 point ROS was performed and is negative except as noted in the HPI. *** Unable to obtain due to altered mental status.   Past Medical History:  Diagnosis Date  . Anemia   . ESRD on hemodialysis ([place])   . Hepatitis    hx of hep C - 10-20 years ago   . History of blood transfusion   . History of kidney cancer   . Hyperlipidemia   . Hypertension    not taken b/p med in 2-3 years md deceased never went to another md   . Renal cell carcinoma ([place]) 2018  . Stage 4 lung cancer ([place])    new - due to start therapy with next appt on 12/14   ***  Family History  Problem Relation Age of Onset  . Hypertension Mother   . Pancreatic cancer Father   . Breast cancer Sister    ***  Social History:  reports that she quit smoking about 20 years ago. Her smoking use included cigarettes. She has a 15.00 pack-year smoking history. She has never used smokeless tobacco. She reports current alcohol use. She reports that she does not use drugs. ***  Exam: Current vital signs: BP (!) 194/84   Pulse 92   Temp (!) 97.5 F (36.4 C) (Axillary)   Resp 13   SpO2 100%  Vital signs in last 24 hours: Temp:  [97.5 F (36.4 C)-98.9 F (37.2 C)] 97.5 F (36.4 C) (01/04 1817) Pulse Rate:  [86-98] 92 (01/04 2230) Resp:  [13-24] 13 (01/04 2230) BP: (140-194)/(70-91) 194/84 (01/04 2230) SpO2:  [98 %-100 %] 100 % (01/04 2230)   Physical Exam  Constitutional: Appears well-developed and well-nourished.  Psych: Affect appropriate to situation Eyes: No scleral injection HENT: No OP obstrucion MSK: no joint deformities.  Cardiovascular: Normal rate and regular rhythm.  Respiratory: Effort normal, non-labored breathing GI: Soft.  No distension.  There is no tenderness.  Skin: WDI  Neuro: Mental Status: Patient is awake, alert, she is able to name objects, and is relatively fluent, but has significant difficulty with repetition.  Cranial Nerves: II: Visual Fields are full. Pupils are equal, round, and reactive to light.   III,IV, VI: EOMI without ptosis or diploplia.  V: Facial sensation is symmetric to temperature VII: Facial movement is symmetric.  VIII: hearing is intact to voice X: Uvula elevates symmetrically XI: Shoulder shrug is symmetric. XII: tongue is midline without atrophy or fasciculations.  Motor: Tone is normal. Bulk is normal. 5/5 strength was present in all four extremities. *** Sensory: Sensation is symmetric to light touch and temperature in the arms and legs.*** Deep Tendon Reflexes: 2+ and symmetric in the biceps and patellae. *** Plantars: Toes are downgoing bilaterally. *** Cerebellar: FNF and HKS are intact bilaterally***      I have reviewed labs in epic and the results pertinent to this consultation are: ***  I have reviewed the images obtained:***  Impression: ***  Recommendations: 1) ***   Roland Rack, MD [facility] [phone]  If 7pm- 7am, please page neurology on call as listed in [place].

## 2020-12-18 NOTE — Procedures (Signed)
I was present at this dialysis session. I have reviewed the session itself and made appropriate changes.   There were no vitals filed for this visit.  Recent Labs  Lab 12/18/20 0539  NA 131*  K 5.1  CL 87*  CO2 24  GLUCOSE 147*  BUN 132*  CREATININE 7.35*  CALCIUM 8.2*    Recent Labs  Lab 12/17/20 1636 12/18/20 0539  WBC 56.2* 41.4*  HGB 10.0* 8.1*  HCT 31.7* 24.2*  MCV 90.8 85.8  PLT 197 171    Scheduled Meds: . (feeding supplement) PROSource Plus  30 mL Oral BID BM  . amLODipine  10 mg Oral Daily  . carvedilol  12.5 mg Oral BID WC  . Chlorhexidine Gluconate Cloth  6 each Topical Q0600  . dexamethasone  4 mg Oral Q8H  . docusate sodium  100 mg Oral BID  . heparin  5,000 Units Subcutaneous Q8H  . irbesartan  300 mg Oral Daily  . pantoprazole  40 mg Oral Daily  . rosuvastatin  10 mg Oral Daily  . sodium chloride flush  3 mL Intravenous Q12H   Continuous Infusions: . levETIRAcetam     PRN Meds:.acetaminophen **OR** acetaminophen, ondansetron, polyethylene glycol, prochlorperazine, sodium chloride flush   Pearson Grippe  MD 12/18/2020, 3:20 PM

## 2020-12-18 NOTE — ED Notes (Signed)
Labs collected and sent by IV team RN

## 2020-12-18 NOTE — Progress Notes (Signed)
Pt assessed by IV team for peripheral IV. No appropriate vein found for access. Recommend considering changing IV medications to alternate route; recommend considering CVAD if IV medications are necessary for treatment. VAST available for any further questions/concerns.

## 2020-12-18 NOTE — Procedures (Signed)
Patient Name: Cassandra Dyer  MRN: [id number]  Epilepsy Attending: Lora Havens  Referring Physician/Provider: Dr Neva Seat Date: 12/18/2020 Duration: 24.04 mins  Patient history: 80 year old female who recently started radiation therapy for brain metastasis who presents with jerking type movement which would occur and last for a little bit before pausing for a little bit, on and off for about 5 minutes total. EEG to evaluate for seizure.  Level of alertness: Awake, asleep  AEDs during EEG study: LEV  Technical aspects: This EEG study was done with scalp electrodes positioned according to the 10-20 International system of electrode placement. Electrical activity was acquired at a sampling rate of 500Hz  and reviewed with a high frequency filter of 70Hz  and a low frequency filter of 1Hz . EEG data were recorded continuously and digitally stored.   Description: The posterior dominant rhythm consists of 8-9 Hz activity of moderate voltage (25-35 uV) seen predominantly in posterior head regions, symmetric and reactive to eye opening and eye closing. Sleep was characterized by vertex waves, sleep spindles (12 to 14 Hz), maximal frontocentral region. EEG showed intermittent generalized and maximal left frontal 3 to 6 Hz theta-delta slowing.  Sharp waves are also noted in left frontal region. Hyperventilation and photic stimulation were not performed.     ABNORMALITY -Sharp wave, left frontal region -Intermittent slow, generalized and maximal left frontal region  IMPRESSION: This study showed evidence of epileptogenicity arising from left frontal region as well as cortical dysfunction in left frontal region likely secondary to underlying structural abnormalities.  Additionally there is evidence of mild diffuse encephalopathy, nonspecific etiology.  No seizures were seen throughout the recording.  Daxx Tiggs Barbra Sarks

## 2020-12-18 NOTE — ED Notes (Signed)
Pt back to baseline. She is now able to tell me who she is, where she is, and why she is here. Pt no longer sweaty and can move all extremities freely.

## 2020-12-18 NOTE — Consult Note (Signed)
[facility] Renal Consultation Note    Indication for Consultation:  Management of ESRD/hemodialysis, anemia, hypertension/volume, and secondary hyperparathyroidism. PCP:  HPI: Cassandra Dyer is a 80 y.o. female with ESRD, HTN, Hep C, COPD, Hx prior RCC, and relatively new [place] lung cancer with known brain metastasis (on home O2) who was admitted with seizure.  Presented to ED with intermittent L leg pain and R arm shaking which had been ongoing for 2-3 days. She denied dyspnea (more than usual), chest pain, abdominal pain, N/V/D, fever or chills. While in the ED, she was noted to develop AMS. BS 158, she was taken for head CT which was unchanged from recent MRI (known metastatic brain disease). MS resolved without intervention. Initial labs showed Na 129, K 5.1, BUN 123, Cr 6.93, WBC 56.2 (on steroids), Hgb 10. CXR with B layering effusions and mild pulm edema.  Neuro consulted, felt that episodes likely were seizures caused by her cancer and she was started on Keppra QD and ordered for an EEG which has now been completed, but read is pending. She is scheduled for 1st dose of palliative chemo today.  Today, she has no new complaints. No CP or dyspnea. Labs today show Na 131, K 5.1, BUN 132, Cr 7.3, WBC down slightly to 41.4, and Hgb lower at 8.1.  Dialyzes on MWF schedule at [facility]. Her last treatment there was on 12/31, she missed HD on 12/16/20 only.   Past Medical History:  Diagnosis Date  . Anemia   . ESRD on hemodialysis ([place])   . Hepatitis    hx of hep C - 10-20 years ago   . History of blood transfusion   . History of kidney cancer   . Hyperlipidemia   . Hypertension    not taken b/p med in 2-3 years md deceased never went to another md   . Renal cell carcinoma ([place]) 2018  . Stage 4 lung cancer ([place])    new - due to start therapy with next appt on 12/14   Past Surgical History:  Procedure Laterality Date  . ANTERIOR APPROACH HEMI HIP ARTHROPLASTY Right  11/22/2020   Procedure: ANTERIOR APPROACH RIGHT HIP HEMIARTHROPLASTY;  Surgeon: Leandrew Koyanagi, MD;  Location: [place];  Service: Orthopedics;  Laterality: Right;  Needs RNFA  . AV FISTULA PLACEMENT Left 08/07/2020   Procedure: LEFT ARM ARTERIOVENOUS (AV) FISTULA CREATION;  Surgeon: [name], MD;  Location: [place];  Service: Vascular;  Laterality: Left;  . BRONCHIAL BIOPSY  11/05/2020   Procedure: BRONCHIAL BIOPSIES;  Surgeon: Collene Gobble, MD;  Location: [facility] ENDOSCOPY;  Service: Cardiopulmonary;;  . BRONCHIAL BRUSHINGS  11/05/2020   Procedure: BRONCHIAL BRUSHINGS;  Surgeon: Collene Gobble, MD;  Location: [place];  Service: Cardiopulmonary;;  . BRONCHIAL NEEDLE ASPIRATION BIOPSY  11/05/2020   Procedure: BRONCHIAL NEEDLE ASPIRATION BIOPSIES;  Surgeon: Collene Gobble, MD;  Location: [place];  Service: Cardiopulmonary;;  . CATARACT EXTRACTION Bilateral    11/30 left /12/9 right  . ELECTROMAGNETIC NAVIGATION BROCHOSCOPY N/A 11/05/2020   Procedure: ELECTROMAGNETIC NAVIGATION BRONCHOSCOPY;  Surgeon: Collene Gobble, MD;  Location: [place];  Service: Cardiopulmonary;  Laterality: N/A;  . ENDOBRONCHIAL ULTRASOUND N/A 11/05/2020   Procedure: ENDOBRONCHIAL ULTRASOUND;  Surgeon: Collene Gobble, MD;  Location: [facility] ENDOSCOPY;  Service: Cardiopulmonary;  Laterality: N/A;  . FINE NEEDLE ASPIRATION  11/05/2020   Procedure: FINE NEEDLE ASPIRATION (FNA) LINEAR;  Surgeon: Collene Gobble, MD;  Location: [facility] ENDOSCOPY;  Service: Cardiopulmonary;;  .  IR FLUORO GUIDE CV LINE RIGHT  07/27/2020  . IR THORACENTESIS ASP PLEURAL SPACE W/IMG GUIDE  10/24/2020  . IR THORACENTESIS ASP PLEURAL SPACE W/IMG GUIDE  10/25/2020  . IR THORACENTESIS ASP PLEURAL SPACE W/IMG GUIDE  10/31/2020  . IR US GUIDE VASC ACCESS RIGHT  07/27/2020  . ROBOT ASSISTED LAPAROSCOPIC NEPHRECTOMY Right 01/13/2017   Procedure: XI ROBOTIC ASSISTED LAPAROSCOPIC RADICAL NEPHRECTOMY WITH LYSIS OF ADHESION;  Surgeon: Alexis Frock, MD;   Location: [facility];  Service: Urology;  Laterality: Right;  Marland Kitchen VIDEO BRONCHOSCOPY N/A 11/05/2020   Procedure: VIDEO BRONCHOSCOPY WITH FLUORO;  Surgeon: Collene Gobble, MD;  Location: [facility] ENDOSCOPY;  Service: Cardiopulmonary;  Laterality: N/A;   Family History  Problem Relation Age of Onset  . Hypertension Mother   . Pancreatic cancer Father   . Breast cancer Sister    Social History:  reports that she quit smoking about 20 years ago. Her smoking use included cigarettes. She has a 15.00 pack-year smoking history. She has never used smokeless tobacco. She reports current alcohol use. She reports that she does not use drugs.  ROS: As per HPI otherwise negative.  Physical Exam: Vitals:   12/18/20 0715 12/18/20 0743 12/18/20 1021 12/18/20 1128  BP: (!) 173/71  (!) 175/68 (!) 182/72  Pulse: 87  93 93  Resp: (!) 26  15   Temp:  98.9 F (37.2 C)    TempSrc:  Oral    SpO2: 99%  97%      General: Elderly woman, NAD. On nasal O2. Head: Normocephalic, atraumatic, sclera non-icteric, mucus membranes are moist. Neck: Supple without lymphadenopathy/masses. JVD not elevated. Lungs: Dull bases, clear in upper lobes Heart: RRR with normal S1, S2. No murmurs, rubs, or gallops appreciated. Abdomen: Soft, non-tender, non-distended with normoactive bowel sounds. Musculoskeletal:  Strength and tone appear normal for age. Lower extremities: Trace BLE edema Neuro: Alert and oriented X 3. Moves all extremities spontaneously. Psych:  Responds to questions appropriately with a normal affect. Dialysis Access: TDC in R chest  No Known Allergies Prior to Admission medications   Medication Sig Start Date End Date Taking? Authorizing Provider  amLODipine (NORVASC) 10 MG tablet Take 1 tablet (10 mg total) by mouth daily. 04/19/20   Biagio Borg, MD  ascorbic acid (VITAMIN C) 500 MG tablet Take 500 mg by mouth 2 (two) times daily.  08/31/20   [provider]  Black Currant Seed Oil 500 MG CAPS Take 500 mg  by mouth every morning.     [provider]  Calcium Carbonate-Vit D-Min (CALCIUM 1200 PO) Take 1,200 mg by mouth every morning.     [provider]  carvedilol (COREG) 12.5 MG tablet Take 1 tablet (12.5 mg total) by mouth 2 (two) times daily with a meal. 11/28/20   Regalado, Belkys A, MD  Cholecalciferol (VITAMIN D3) 50 MCG (2000 UT) capsule Take 2,000 Units by mouth every morning. 08/31/20   [provider]  dexamethasone (DECADRON) 4 MG tablet Take 1 tablet (4 mg total) by mouth every 8 (eight) hours. 11/28/20   Regalado, Belkys A, MD  docusate sodium (COLACE) 100 MG capsule Take 1 capsule (100 mg total) by mouth 2 (two) times daily. 11/28/20   Regalado, Belkys A, MD  enoxaparin (LOVENOX) 30 MG/0.3ML injection Inject 0.3 mLs (30 mg total) into the skin daily for 14 days. 11/22/20 12/06/20  Leandrew Koyanagi, MD  Garlic 7062 MG CAPS Take 2,000 mg by mouth daily.     [provider]  Multiple Vitamin (MULTIVITAMIN WITH MINERALS) TABS tablet Take 1 tablet by mouth every morning.     [provider]  Omega-3 Fatty Acids (FISH OIL PO) Take 1 capsule by mouth every morning.     [provider]  ondansetron (ZOFRAN) 4 MG tablet Take 1 tablet (4 mg total) by mouth every 8 (eight) hours as needed for nausea or vomiting. 04/19/20   Biagio Borg, MD  oxyCODONE-acetaminophen (PERCOCET) 5-325 MG tablet Take 1-2 tablets by mouth every 8 (eight) hours as needed for severe pain. 11/22/20   Leandrew Koyanagi, MD  pantoprazole (PROTONIX) 40 MG tablet Take 1 tablet (40 mg total) by mouth daily. 11/28/20   Regalado, Belkys A, MD  polyethylene glycol (MIRALAX / GLYCOLAX) 17 g packet Take 17 g by mouth daily as needed for mild constipation. 09/12/20   Domenic Polite, MD  prochlorperazine (COMPAZINE) 10 MG tablet Take 1 tablet (10 mg total) by mouth every 6 (six) hours as needed. 12/05/20   Heilingoetter, Cassandra L, PA-C  rosuvastatin (CRESTOR) 10 MG tablet Take 1 tablet (10  mg total) by mouth daily. 09/26/20   Biagio Borg, MD  senna-docusate (SENOKOT-S) 8.6-50 MG tablet Take 1 tablet by mouth daily as needed for mild constipation. 09/12/20   Domenic Polite, MD  telmisartan (MICARDIS) 80 MG tablet Take 80 mg by mouth daily. 10/15/20   [provider]   Current Facility-Administered Medications  Medication Dose Route Frequency Provider Last Rate Last Admin  . acetaminophen (TYLENOL) tablet 650 mg  650 mg Oral Q6H PRN Marcelyn Bruins, MD       Or  . acetaminophen (TYLENOL) suppository 650 mg  650 mg Rectal Q6H PRN Marcelyn Bruins, MD      . amLODipine (NORVASC) tablet 10 mg  10 mg Oral Daily Marcelyn Bruins, MD   10 mg at 12/18/20 1128  . carvedilol (COREG) tablet 12.5 mg  12.5 mg Oral BID WC Marcelyn Bruins, MD   12.5 mg at 12/18/20 1128  . Chlorhexidine Gluconate Cloth 2 % PADS 6 each  6 each Topical Q0600 Elmarie Shiley, MD      . dexamethasone (DECADRON) tablet 4 mg  4 mg Oral Q8H Marcelyn Bruins, MD   4 mg at 12/18/20 0535  . docusate sodium (COLACE) capsule 100 mg  100 mg Oral BID Marcelyn Bruins, MD   100 mg at 12/18/20 1128  . heparin injection 5,000 Units  5,000 Units Subcutaneous Q8H Marcelyn Bruins, MD      . irbesartan (AVAPRO) tablet 300 mg  300 mg Oral Daily Marcelyn Bruins, MD      . levETIRAcetam (KEPPRA) IVPB 500 mg/100 mL premix  500 mg Intravenous Q24H Greta Doom, MD      . ondansetron [facility]) tablet 4 mg  4 mg Oral Q8H PRN Marcelyn Bruins, MD      . pantoprazole (PROTONIX) EC tablet 40 mg  40 mg Oral Daily Marcelyn Bruins, MD   40 mg at 12/18/20 1128  . polyethylene glycol (MIRALAX / GLYCOLAX) packet 17 g  17 g Oral Daily PRN Marcelyn Bruins, MD      . prochlorperazine (COMPAZINE) tablet 10 mg  10 mg Oral Q6H PRN Marcelyn Bruins, MD      . rosuvastatin (CRESTOR) tablet 10 mg  10 mg Oral Daily Marcelyn Bruins, MD   10 mg at 12/18/20 1128  . sodium chloride flush (NS) 0.9 %  injection 10-40 mL  10-40 mL Intracatheter PRN Marcelyn Bruins, MD      . sodium chloride flush (NS) 0.9 % injection 3 mL  3 mL Intravenous Q12H Marcelyn Bruins, MD   3 mL at 12/18/20 1478   Current Outpatient Medications  Medication Sig Dispense Refill  . amLODipine (NORVASC) 10 MG tablet Take 1 tablet (10 mg total) by mouth daily. 90 tablet 3  . ascorbic acid (VITAMIN C) 500 MG tablet Take 500 mg by mouth 2 (two) times daily.     . Black Currant Seed Oil 500 MG CAPS Take 500 mg by mouth every morning.     . Calcium Carbonate-Vit D-Min (CALCIUM 1200 PO) Take 1,200 mg by mouth every morning.     . carvedilol (COREG) 12.5 MG tablet Take 1 tablet (12.5 mg total) by mouth 2 (two) times daily with a meal. 60 tablet 1  . Cholecalciferol (VITAMIN D3) 50 MCG (2000 UT) capsule Take 2,000 Units by mouth every morning.    Marland Kitchen dexamethasone (DECADRON) 4 MG tablet Take 1 tablet (4 mg total) by mouth every 8 (eight) hours. 90 tablet 0  . docusate sodium (COLACE) 100 MG capsule Take 1 capsule (100 mg total) by mouth 2 (two) times daily. 10 capsule 0  . enoxaparin (LOVENOX) 30 MG/0.3ML injection Inject 0.3 mLs (30 mg total) into the skin daily for 14 days. 4.2 mL 0  . Garlic 2956 MG CAPS Take 2,000 mg by mouth daily.     . Multiple Vitamin (MULTIVITAMIN WITH MINERALS) TABS tablet Take 1 tablet by mouth every morning.     . Omega-3 Fatty Acids (FISH OIL PO) Take 1 capsule by mouth every morning.     . ondansetron (ZOFRAN) 4 MG tablet Take 1 tablet (4 mg total) by mouth every 8 (eight) hours as needed for nausea or vomiting. 30 tablet 1  . oxyCODONE-acetaminophen (PERCOCET) 5-325 MG tablet Take 1-2 tablets by mouth every 8 (eight) hours as needed for severe pain. 30 tablet 0  . pantoprazole (PROTONIX) 40 MG tablet Take 1 tablet (40 mg total) by mouth daily. 30 tablet 0  . polyethylene glycol (MIRALAX / GLYCOLAX) 17 g packet Take 17 g by mouth daily as needed for mild constipation.    . prochlorperazine  (COMPAZINE) 10 MG tablet Take 1 tablet (10 mg total) by mouth every 6 (six) hours as needed. 30 tablet 2  . rosuvastatin (CRESTOR) 10 MG tablet Take 1 tablet (10 mg total) by mouth daily. 90 tablet 3  . senna-docusate (SENOKOT-S) 8.6-50 MG tablet Take 1 tablet by mouth daily as needed for mild constipation.    Marland Kitchen telmisartan (MICARDIS) 80 MG tablet Take 80 mg by mouth daily.     Labs: Basic Metabolic Panel: Recent Labs  Lab 12/17/20 1636 12/18/20 0539  NA 129* 131*  K 5.7* 5.1  CL 88* 87*  CO2 19* 24  GLUCOSE 139* 147*  BUN 123* 132*  CREATININE 6.93* 7.35*  CALCIUM 8.3* 8.2*   Liver Function Tests: Recent Labs  Lab 12/18/20 0539  AST 20  ALT 22  ALKPHOS 151*  BILITOT 1.4*  PROT 4.7*  ALBUMIN 2.0*   Recent Labs  Lab 12/18/20 0057  AMMONIA 23   CBC: Recent Labs  Lab 12/17/20 1636 12/18/20 0539  WBC 56.2* 41.4*  HGB 10.0* 8.1*  HCT 31.7* 24.2*  MCV 90.8 85.8  PLT 197 171   Studies/Results: CT Head Wo Contrast  Result Date: 12/17/2020 CLINICAL DATA:  Mental status  change EXAM: CT HEAD WITHOUT CONTRAST TECHNIQUE: Contiguous axial images were obtained from the base of the skull through the vertex without intravenous contrast. COMPARISON:  MRI 11/26/2020 FINDINGS: Brain: No acute interval hemorrhage. Stable ventricle size. Grossly similar edema within the left parietal white matter. Slightly dense metastatic lesions are again noted. 1.4 x 1.3 cm left parietal mass, series 4, image number 26. Small 7 mm hyperdense mass within the left frontal lobe, series 4, image number 25. Hyperdense mass within the right lateral ventricle measuring 1.6 x 2.3 cm. Approximate 14 mm left cerebellar lesion vaguely visualized. The other metastatic lesions on MRI are better seen on MRI. No significant midline shift. Vascular: No hyperdense vessels.  Carotid vascular calcification. Skull: Normal. Negative for fracture or focal lesion. Sinuses/Orbits: No acute finding. Other: None IMPRESSION: 1.  No definite acute interval change as compared with MRI from December 2021 allowing for limitations of inter modality comparison. Intracranial metastatic disease with grossly stable edema within the left parietal white matter. Only some of the metastatic lesions are appreciated on this non contrasted CT. 2. Mild chronic small vessel ischemic change of the white matter. Electronically Signed   By: Donavan Foil M.D.   On: 12/17/2020 19:41   MR BRAIN WO CONTRAST  Result Date: 12/17/2020 CLINICAL DATA:  Initial evaluation for acute seizure, history of metastatic lung carcinoma. EXAM: MRI HEAD WITHOUT CONTRAST TECHNIQUE: Multiplanar, multiecho pulse sequences of the brain and surrounding structures were obtained without intravenous contrast. COMPARISON:  Prior CT from earlier same day as well as recent brain MRI from 11/26/2020. FINDINGS: Brain: Again seen are multiple lesions scattered throughout the supratentorial and infratentorial brain, consistent with known intracranial metastatic disease. Overall number of these lesions appears grossly similar, although exact delineation limited due to lack of IV contrast. Associated vasogenic edema about a lesion centered at the posterior left frontoparietal region is similar to perhaps mildly increased. Additionally, there is mildly increased vasogenic edema seen about a few lesions at the left frontal lobe anteriorly (series 11, images 21, 22). Increased edema also seen about a lesion at the peripheral left cerebellum (series 11, image 7). The lesion positioned along the posterior septum pellucidum may be slightly increased in size measuring 2.6 x 1.9 cm on today's exam, previously 2.4 x 1.7 cm when measured in a similar fashion. Overall, appearance suggest mild disease progression as compared to previous. No midline shift. Associated susceptibility artifact about a lesion at the left occipital lobe is stable from previous. No other associated hemorrhage. No evidence for  acute or subacute infarct. No other diffusion abnormality to suggest changes related to seizure. No other evidence for acute or chronic intracranial hemorrhage. No hydrocephalus or ventricular trapping. No extra-axial fluid collection. Pituitary gland suprasellar region within normal limits. Vascular: Major intracranial vascular flow voids are maintained. Skull and upper cervical spine: Craniocervical junction within normal limits. Bone marrow signal intensity normal. No scalp soft tissue abnormality. Sinuses/Orbits: Patient status post bilateral ocular lens replacement. No acute abnormality about the globes and orbital soft tissues. Paranasal sinuses are largely clear. No significant mastoid effusion. Other: None. IMPRESSION: 1. Probable mild disease progression as compared to previous MRI from 11/27/2019 as evidenced by mildly increased vasogenic edema about a few lesions situated at the left frontal lobe and left cerebellum as above. No midline shift or hydrocephalus. 2. No other acute intracranial abnormality. Electronically Signed   By: Jeannine Boga M.D.   On: 12/17/2020 23:52   DG Chest [facility] 1 View  Result  Date: 12/17/2020 CLINICAL DATA:  Altered mental status, leg swelling EXAM: PORTABLE CHEST 1 VIEW COMPARISON:  November 20, 2020 FINDINGS: Bilateral layering pleural effusions with adjacent atelectasis. Probable mild pulmonary edema. No pneumothorax. Stable cardiomediastinal contours. Right tunneled line is unchanged. IMPRESSION: Bilateral layering pleural effusions with adjacent atelectasis. Probable mild pulmonary edema. Electronically Signed   By: Macy Mis M.D.   On: 12/17/2020 19:46   EEG adult  Result Date: 12/18/2020 Lora Havens, MD     12/18/2020 10:10 AM Patient Name: Cassandra Dyer MRN: [id number] Epilepsy Attending: Lora Havens Referring Physician/Provider: Dr Neva Seat Date: 12/18/2020 Duration: 24.04 mins Patient history: 80 year old female who recently started  radiation therapy for brain metastasis who presents with jerking type movement which would occur and last for a little bit before pausing for a little bit, on and off for about 5 minutes total. EEG to evaluate for seizure. Level of alertness: Awake, asleep AEDs during EEG study: LEV Technical aspects: This EEG study was done with scalp electrodes positioned according to the 10-20 International system of electrode placement. Electrical activity was acquired at a sampling rate of 500Hz  and reviewed with a high frequency filter of 70Hz  and a low frequency filter of 1Hz . EEG data were recorded continuously and digitally stored. Description: The posterior dominant rhythm consists of 8-9 Hz activity of moderate voltage (25-35 uV) seen predominantly in posterior head regions, symmetric and reactive to eye opening and eye closing. Sleep was characterized by vertex waves, sleep spindles (12 to 14 Hz), maximal frontocentral region. EEG showed intermittent generalized and maximal left frontal 3 to 6 Hz theta-delta slowing.  Sharp waves are also noted in left frontal region. Hyperventilation and photic stimulation were not performed.   ABNORMALITY -Sharp wave, left frontal region -Intermittent slow, generalized and maximal left frontal region IMPRESSION: This study showed evidence of epileptogenicity arising from left frontal region as well as cortical dysfunction in left frontal region likely secondary to underlying structural abnormalities.  Additionally there is evidence of mild diffuse encephalopathy, nonspecific etiology.  No seizures were seen throughout the recording. Priyanka Barbra Sarks   Dialysis Orders:  MWF @ [facility] 4hr, 350/800, EDW 48.5kg, 2K/2Ca, TDC, heparin 2000 - Venofer 50mg  IV q week - No ESA d/t cancer  Assessment/Plan: 1.  Seizure: On Keppra, EEG pending. D/t #2 - follow. 2.  Metastatic lung cancer to brain: On dexamethazone TID, starting palliative chemo today. 3.  ESRD: Continue HD per MWF schedule -  for HD today, steroids likely contributing to ^ BUN as she only missed 1 HD session.  4.  Hypertension/volume: BP high, but already dropping on HD - B effusions in lungs but unclear if we can touch this with dialysis, likely malignant effusions - UF as tolerated. 5.  Anemia: Hgb down to 8.1 - typically ESAs are held in setting of cancer. In her case however, may need to weigh against risks of repeated transfusions. Follow for now. 6.  Metabolic bone disease: CorrCa slightly high, no VDRA - follow. 7.  Nutrition: Alb low, will add protein supplements. 8.  DNR  Veneta Penton, PA-C 12/18/2020, 1:14 PM  [facility]

## 2020-12-18 NOTE — ED Notes (Signed)
RN attempted to call report

## 2020-12-18 NOTE — Consult Note (Signed)
Neurology Consultation Reason for Consult: Concern for seizure Referring Physician: Maryan Rued, W  CC: Arm shaking  History is obtained from: Patient  HPI: Cassandra Dyer is a 80 y.o. female who initially presented to the emergency department due to arm shaking.  She describes it as a jerking type movement which would occur and last for a little bit before pausing for a little bit, on and off for about 5 minutes total.  Due to this she sought care in the emergency department.  While she was in the ER, she had a change in mental status with confusion.  The onset of this was unwitnessed, but she has been gradually improving.  She has some right-sided weakness which she states has been present since around the time of radiation.   ROS: A 14 point ROS was performed and is negative except as noted in the HPI.  Past Medical History:  Diagnosis Date  . Anemia   . ESRD on hemodialysis ([place])   . Hepatitis    hx of hep C - 10-20 years ago   . History of blood transfusion   . History of kidney cancer   . Hyperlipidemia   . Hypertension    not taken b/p med in 2-3 years md deceased never went to another md   . Renal cell carcinoma ([place]) 2018  . Stage 4 lung cancer ([place])    new - due to start therapy with next appt on 12/14     Family History  Problem Relation Age of Onset  . Hypertension Mother   . Pancreatic cancer Father   . Breast cancer Sister      Social History:  reports that she quit smoking about 20 years ago. Her smoking use included cigarettes. She has a 15.00 pack-year smoking history. She has never used smokeless tobacco. She reports current alcohol use. She reports that she does not use drugs.   Exam: Current vital signs: BP (!) 176/74   Pulse 90   Temp 97.7 F (36.5 C) (Oral)   Resp (!) 21   SpO2 100%  Vital signs in last 24 hours: Temp:  [97.5 F (36.4 C)-98.9 F (37.2 C)] 97.7 F (36.5 C) (01/05 0105) Pulse Rate:  [84-98] 90 (01/05 0430) Resp:  [13-26] 21  (01/05 0430) BP: (140-194)/(67-91) 176/74 (01/05 0430) SpO2:  [98 %-100 %] 100 % (01/05 0430)   Physical Exam  Constitutional: Appears well-developed and well-nourished.  Psych: Affect appropriate to situation Eyes: No scleral injection HENT: No OP obstrucion MSK: no joint deformities.  Cardiovascular: Normal rate and regular rhythm.  Respiratory: Effort normal, non-labored breathing GI: Soft.  No distension. There is no tenderness.  Skin: WDI  Neuro: Mental Status: Patient is awake, alert, she is able to name objects, and is relatively fluent but is unable to repeat Cranial Nerves: II: Visual Fields are full. Pupils are equal, round, and reactive to light.   III,IV, VI: EOMI without ptosis or diploplia.  V: Facial sensation is symmetric to temperature VII: Facial movement with mild right facial weakness VIII: hearing is intact to voice X: Uvula elevates symmetrically XI: Shoulder shrug is symmetric. XII: tongue is midline without atrophy or fasciculations.  Motor: Tone is normal. Bulk is normal. 5/5 strength was present on the left, she has mild right arm and leg weakness Sensory: Sensation is symmetric to light touch and temperature in the arms and legs. Cerebellar: Mildly impaired on the right consistent with weakness    I have reviewed labs in  epic and the results pertinent to this consultation are: Creatinine 6.93  I have reviewed the images obtained: CT head-relatively stable metastatic disease  Impression: 80 year old female who recently started radiation therapy for brain metastasis who presents with episode strongly suggestive of seizure.  I would favor starting antiepileptic medication at this time.  I would also favor getting an MRI to assess the level of edema and getting an EEG to check for degree of irritability.  Recommendations: 1) Keppra 500 mg daily  2) MRI brain 3) EEG   Roland Rack, MD [facility] [phone]  If 7pm- 7am,  please page neurology on call as listed in [place].

## 2020-12-18 NOTE — ED Notes (Signed)
Pt transferred to dialysis.

## 2020-12-18 NOTE — ED Notes (Signed)
RN gave report to [place].

## 2020-12-18 NOTE — Progress Notes (Signed)
EEG completed, results pending. 

## 2020-12-18 NOTE — Progress Notes (Signed)
PROGRESS NOTE    Cassandra Dyer  LKT:[id number] DOB: 1941-10-07 DOA: 12/17/2020 PCP: Biagio Borg, MD     Brief Narrative:  Cassandra Dyer is a 80 y.o. female with medical history significant of stage IV lung cancer with brain metastasis, anemia, ESRD on HD Monday Wednesday Friday, COPD, hyperlipidemia, hypertension, hepatitis C, renal cell carcinoma, and thrombocytopenia who presents following 2 to 3 days of left leg pain and right arm shaking. Patient reportedly has had to 3 days of left leg pain and right arm shaking.  She missed her dialysis session due to this. While in the ED she acutely became altered and had an apparent seizure.  Neurology was consulted; she got a Keppra load and she is now mentating appropriately.  New events last 24 hours / Subjective: Patient seen in the emergency department.  She has no physical complaints on examination.  Unaware that she had a seizure episode in the ED last night.  Assessment & Plan:   Principal Problem:   Seizures ([place]) Active Problems:   Essential hypertension   Dyslipidemia   Hyperkalemia   Anemia   ESRD (end stage renal disease) (HCC)   Volume overload   Brain metastases (HCC)   Hyponatremia   Seizure -Patient with history of lung cancer with brain mets -EEG: No seizures seen, did reveal evidence of epileptogenicity arising from left frontal region as well as cortical dysfunction in left frontal region likely secondary to underlying structural abnormalities.  Additionally there is evidence of mild diffuse encephalopathy, nonspecific etiology -MRI brain: Probable mild disease progression as compared to previous MRI from 11/27/2019 as evidenced by mildly increased vasogenic edema about a few lesions situated at the left frontal lobe and left cerebellum as above. No midline shift or hydrocephalus. -Neurology consulted -Continue Keppra  ESRD, on HD MWF  -Nephrology consulted  Stage IV lung cancer with brain mets -Continue  Decadron -Follows with Dr. Julien Nordmann   Essential hypertension -Continue Coreg, Norvasc, ARB  Hyperlipidemia -Continue Crestor  Leukocytosis -In setting of decadron use. Monitor    DVT prophylaxis:  heparin injection 5,000 Units Start: 12/18/20 1400  Code Status: DNR Family Communication: No family at bedside Disposition Plan:  Status is: Observation  The patient remains OBS appropriate and will d/c before 2 midnights.  Dispo: The patient is from: Home              Anticipated d/c is to: Home              Anticipated d/c date is: 1 day              Patient currently is not medically stable to d/c.  Plan for dialysis today.  EEG and MRI have been ordered, neurology is following.  Possible discharge home 1/6.   Consultants:   Neurology  Nephrology   Antimicrobials:  Anti-infectives (From admission, onward)   None        Objective: Vitals:   12/18/20 1403 12/18/20 1418 12/18/20 1433 12/18/20 1503  BP: 111/69 127/78 (!) 107/59 (!) 100/57  Pulse:      Resp: 20 (!) 27 19 (!) 21  Temp:      TempSrc:      SpO2:        Intake/Output Summary (Last 24 hours) at 12/18/2020 1545 Last data filed at 12/18/2020 0215 Gross per 24 hour  Intake 250 ml  Output --  Net 250 ml   There were no vitals filed for this visit.  Examination:  General exam: Appears calm and comfortable  Respiratory system: Clear to auscultation. Respiratory effort normal. No respiratory distress. No conversational dyspnea.  Cardiovascular system: S1 & S2 heard, RRR. No murmurs. No pedal edema. Gastrointestinal system: Abdomen is nondistended, soft and nontender. Normal bowel sounds heard. Central nervous system: Alert and oriented. No focal neurological deficits. Speech clear.  Extremities: Symmetric in appearance  Skin: No rashes, lesions or ulcers on exposed skin  Psychiatry: Judgement and insight appear normal. Mood & affect appropriate.   Data Reviewed: I have personally reviewed following  labs and imaging studies  CBC: Recent Labs  Lab 12/17/20 1636 12/18/20 0539  WBC 56.2* 41.4*  HGB 10.0* 8.1*  HCT 31.7* 24.2*  MCV 90.8 85.8  PLT 197 350   Basic Metabolic Panel: Recent Labs  Lab 12/17/20 1636 12/18/20 0539  NA 129* 131*  K 5.7* 5.1  CL 88* 87*  CO2 19* 24  GLUCOSE 139* 147*  BUN 123* 132*  CREATININE 6.93* 7.35*  CALCIUM 8.3* 8.2*   GFR: Estimated Creatinine Clearance: 5.1 mL/min (A) (by C-G formula based on SCr of 7.35 mg/dL (H)). Liver Function Tests: Recent Labs  Lab 12/18/20 0539  AST 20  ALT 22  ALKPHOS 151*  BILITOT 1.4*  PROT 4.7*  ALBUMIN 2.0*   No results for input(s): LIPASE, AMYLASE in the last 168 hours. Recent Labs  Lab 12/18/20 0057  AMMONIA 23   Coagulation Profile: Recent Labs  Lab 12/18/20 0057  INR 1.2   Cardiac Enzymes: No results for input(s): CKTOTAL, CKMB, CKMBINDEX, TROPONINI in the last 168 hours. BNP (last 3 results) No results for input(s): PROBNP in the last 8760 hours. HbA1C: No results for input(s): HGBA1C in the last 72 hours. CBG: Recent Labs  Lab 12/17/20 1855  GLUCAP 158*   Lipid Profile: No results for input(s): CHOL, HDL, LDLCALC, TRIG, CHOLHDL, LDLDIRECT in the last 72 hours. Thyroid Function Tests: No results for input(s): TSH, T4TOTAL, FREET4, T3FREE, THYROIDAB in the last 72 hours. Anemia Panel: No results for input(s): VITAMINB12, FOLATE, FERRITIN, TIBC, IRON, RETICCTPCT in the last 72 hours. Sepsis Labs: No results for input(s): PROCALCITON, LATICACIDVEN in the last 168 hours.  Recent Results (from the past 240 hour(s))  SARS CORONAVIRUS 2 (TAT 6-24 HRS) Nasopharyngeal Nasopharyngeal Swab     Status: None   Collection Time: 12/17/20  7:53 PM   Specimen: Nasopharyngeal Swab  Result Value Ref Range Status   SARS Coronavirus 2 NEGATIVE NEGATIVE Final    Comment: (NOTE) SARS-CoV-2 target nucleic acids are NOT DETECTED.  The SARS-CoV-2 RNA is generally detectable in upper and  lower respiratory specimens during the acute phase of infection. Negative results do not preclude SARS-CoV-2 infection, do not rule out co-infections with other pathogens, and should not be used as the sole basis for treatment or other patient management decisions. Negative results must be combined with clinical observations, patient history, and epidemiological information. The expected result is Negative.  Fact Sheet for Patients: SugarRoll.be  Fact Sheet for Healthcare Providers: https://www.woods-mathews.com/  This test is not yet approved or cleared by the [place] FDA and  has been authorized for detection and/or diagnosis of SARS-CoV-2 by FDA under an Emergency Use Authorization (EUA). This EUA will remain  in effect (meaning this test can be used) for the duration of the COVID-19 declaration under Se ction 564(b)(1) of the Act, 21 U.S.C. section 360bbb-3(b)(1), unless the authorization is terminated or revoked sooner.  Performed at [facility], [place] [address],  [place], [place] [address]       Radiology Studies: CT Head Wo Contrast  Result Date: 12/17/2020 CLINICAL DATA:  Mental status change EXAM: CT HEAD WITHOUT CONTRAST TECHNIQUE: Contiguous axial images were obtained from the base of the skull through the vertex without intravenous contrast. COMPARISON:  MRI 11/26/2020 FINDINGS: Brain: No acute interval hemorrhage. Stable ventricle size. Grossly similar edema within the left parietal white matter. Slightly dense metastatic lesions are again noted. 1.4 x 1.3 cm left parietal mass, series 4, image number 26. Small 7 mm hyperdense mass within the left frontal lobe, series 4, image number 25. Hyperdense mass within the right lateral ventricle measuring 1.6 x 2.3 cm. Approximate 14 mm left cerebellar lesion vaguely visualized. The other metastatic lesions on MRI are better seen on MRI. No significant midline shift. Vascular: No  hyperdense vessels.  Carotid vascular calcification. Skull: Normal. Negative for fracture or focal lesion. Sinuses/Orbits: No acute finding. Other: None IMPRESSION: 1. No definite acute interval change as compared with MRI from December 2021 allowing for limitations of inter modality comparison. Intracranial metastatic disease with grossly stable edema within the left parietal white matter. Only some of the metastatic lesions are appreciated on this non contrasted CT. 2. Mild chronic small vessel ischemic change of the white matter. Electronically Signed   By: Donavan Foil M.D.   On: 12/17/2020 19:41   MR BRAIN WO CONTRAST  Result Date: 12/17/2020 CLINICAL DATA:  Initial evaluation for acute seizure, history of metastatic lung carcinoma. EXAM: MRI HEAD WITHOUT CONTRAST TECHNIQUE: Multiplanar, multiecho pulse sequences of the brain and surrounding structures were obtained without intravenous contrast. COMPARISON:  Prior CT from earlier same day as well as recent brain MRI from 11/26/2020. FINDINGS: Brain: Again seen are multiple lesions scattered throughout the supratentorial and infratentorial brain, consistent with known intracranial metastatic disease. Overall number of these lesions appears grossly similar, although exact delineation limited due to lack of IV contrast. Associated vasogenic edema about a lesion centered at the posterior left frontoparietal region is similar to perhaps mildly increased. Additionally, there is mildly increased vasogenic edema seen about a few lesions at the left frontal lobe anteriorly (series 11, images 21, 22). Increased edema also seen about a lesion at the peripheral left cerebellum (series 11, image 7). The lesion positioned along the posterior septum pellucidum may be slightly increased in size measuring 2.6 x 1.9 cm on today's exam, previously 2.4 x 1.7 cm when measured in a similar fashion. Overall, appearance suggest mild disease progression as compared to previous. No  midline shift. Associated susceptibility artifact about a lesion at the left occipital lobe is stable from previous. No other associated hemorrhage. No evidence for acute or subacute infarct. No other diffusion abnormality to suggest changes related to seizure. No other evidence for acute or chronic intracranial hemorrhage. No hydrocephalus or ventricular trapping. No extra-axial fluid collection. Pituitary gland suprasellar region within normal limits. Vascular: Major intracranial vascular flow voids are maintained. Skull and upper cervical spine: Craniocervical junction within normal limits. Bone marrow signal intensity normal. No scalp soft tissue abnormality. Sinuses/Orbits: Patient status post bilateral ocular lens replacement. No acute abnormality about the globes and orbital soft tissues. Paranasal sinuses are largely clear. No significant mastoid effusion. Other: None. IMPRESSION: 1. Probable mild disease progression as compared to previous MRI from 11/27/2019 as evidenced by mildly increased vasogenic edema about a few lesions situated at the left frontal lobe and left cerebellum as above. No midline shift or hydrocephalus. 2. No other acute intracranial  abnormality. Electronically Signed   By: Jeannine Boga M.D.   On: 12/17/2020 23:52   DG Chest Port 1 View  Result Date: 12/17/2020 CLINICAL DATA:  Altered mental status, leg swelling EXAM: PORTABLE CHEST 1 VIEW COMPARISON:  November 20, 2020 FINDINGS: Bilateral layering pleural effusions with adjacent atelectasis. Probable mild pulmonary edema. No pneumothorax. Stable cardiomediastinal contours. Right tunneled line is unchanged. IMPRESSION: Bilateral layering pleural effusions with adjacent atelectasis. Probable mild pulmonary edema. Electronically Signed   By: Macy Mis M.D.   On: 12/17/2020 19:46   EEG adult  Result Date: 12/18/2020 Lora Havens, MD     12/18/2020 10:10 AM Patient Name: Cassandra Dyer MRN: [id number] Epilepsy  Attending: Lora Havens Referring Physician/Provider: Dr Neva Seat Date: 12/18/2020 Duration: 24.04 mins Patient history: 80 year old female who recently started radiation therapy for brain metastasis who presents with jerking type movement which would occur and last for a little bit before pausing for a little bit, on and off for about 5 minutes total. EEG to evaluate for seizure. Level of alertness: Awake, asleep AEDs during EEG study: LEV Technical aspects: This EEG study was done with scalp electrodes positioned according to the 10-20 International system of electrode placement. Electrical activity was acquired at a sampling rate of 500Hz  and reviewed with a high frequency filter of 70Hz  and a low frequency filter of 1Hz . EEG data were recorded continuously and digitally stored. Description: The posterior dominant rhythm consists of 8-9 Hz activity of moderate voltage (25-35 uV) seen predominantly in posterior head regions, symmetric and reactive to eye opening and eye closing. Sleep was characterized by vertex waves, sleep spindles (12 to 14 Hz), maximal frontocentral region. EEG showed intermittent generalized and maximal left frontal 3 to 6 Hz theta-delta slowing.  Sharp waves are also noted in left frontal region. Hyperventilation and photic stimulation were not performed.   ABNORMALITY -Sharp wave, left frontal region -Intermittent slow, generalized and maximal left frontal region IMPRESSION: This study showed evidence of epileptogenicity arising from left frontal region as well as cortical dysfunction in left frontal region likely secondary to underlying structural abnormalities.  Additionally there is evidence of mild diffuse encephalopathy, nonspecific etiology.  No seizures were seen throughout the recording. Priyanka Barbra Sarks      Scheduled Meds: . (feeding supplement) PROSource Plus  30 mL Oral BID BM  . amLODipine  10 mg Oral Daily  . carvedilol  12.5 mg Oral BID WC  . Chlorhexidine  Gluconate Cloth  6 each Topical Q0600  . dexamethasone  4 mg Oral Q8H  . docusate sodium  100 mg Oral BID  . heparin  5,000 Units Subcutaneous Q8H  . irbesartan  300 mg Oral Daily  . pantoprazole  40 mg Oral Daily  . rosuvastatin  10 mg Oral Daily  . sodium chloride flush  3 mL Intravenous Q12H   Continuous Infusions: . levETIRAcetam       LOS: 0 days      Time spent: 25 minutes   Dessa Phi, DO [facility] 12/18/2020, 3:45 PM   Available via Epic secure chat 7am-7pm After these hours, please refer to coverage provider listed on [email or web address]

## 2020-12-19 ENCOUNTER — Observation Stay: Payer: Self-pay

## 2020-12-19 ENCOUNTER — Inpatient Hospital Stay: Payer: Medicare Other

## 2020-12-19 ENCOUNTER — Encounter: Payer: Self-pay | Admitting: Radiation Oncology

## 2020-12-19 ENCOUNTER — Other Ambulatory Visit (HOSPITAL_COMMUNITY): Payer: Self-pay | Admitting: Internal Medicine

## 2020-12-19 ENCOUNTER — Ambulatory Visit
Admission: RE | Admit: 2020-12-19 | Discharge: 2020-12-19 | Disposition: A | Discharge status: Home / Self Care | Payer: Medicare Other | Source: Ambulatory Visit | Attending: Radiation Oncology | Admitting: Radiation Oncology

## 2020-12-19 DIAGNOSIS — L8990 Pressure ulcer of unspecified site, unspecified stage: Secondary | ICD-10-CM | POA: Insufficient documentation

## 2020-12-19 DIAGNOSIS — C3432 Malignant neoplasm of lower lobe, left bronchus or lung: Secondary | ICD-10-CM | POA: Insufficient documentation

## 2020-12-19 DIAGNOSIS — R569 Unspecified convulsions: Secondary | ICD-10-CM | POA: Diagnosis not present

## 2020-12-19 DIAGNOSIS — Z510 Encounter for antineoplastic radiation therapy: Secondary | ICD-10-CM | POA: Insufficient documentation

## 2020-12-19 LAB — CBC
HCT: 19.4 % — ABNORMAL LOW (ref 36.0–46.0)
Hemoglobin: 6.6 g/dL — CL (ref 12.0–15.0)
MCH: 29.6 pg (ref 26.0–34.0)
MCHC: 34 g/dL (ref 30.0–36.0)
MCV: 87 fL (ref 80.0–100.0)
Platelets: 150 10*3/uL (ref 150–400)
RBC: 2.23 MIL/uL — ABNORMAL LOW (ref 3.87–5.11)
RDW: 17.5 % — ABNORMAL HIGH (ref 11.5–15.5)
WBC: 33.7 10*3/uL — ABNORMAL HIGH (ref 4.0–10.5)
nRBC: 0 % (ref 0.0–0.2)

## 2020-12-19 LAB — BASIC METABOLIC PANEL
Anion gap: 14 (ref 5–15)
BUN: 54 mg/dL — ABNORMAL HIGH (ref 8–23)
CO2: 27 mmol/L (ref 22–32)
Calcium: 8 mg/dL — ABNORMAL LOW (ref 8.9–10.3)
Chloride: 95 mmol/L — ABNORMAL LOW (ref 98–111)
Creatinine, Ser: 4.1 mg/dL — ABNORMAL HIGH (ref 0.44–1.00)
GFR, Estimated: 11 mL/min — ABNORMAL LOW (ref >60)
Glucose, Bld: 132 mg/dL — ABNORMAL HIGH (ref 70–99)
Potassium: 3.7 mmol/L (ref 3.5–5.1)
Sodium: 136 mmol/L (ref 135–145)

## 2020-12-19 LAB — PREPARE RBC (CROSSMATCH)

## 2020-12-19 LAB — HEMOGLOBIN AND HEMATOCRIT, BLOOD
HCT: 21.9 % — ABNORMAL LOW (ref 36.0–46.0)
Hemoglobin: 7.1 g/dL — ABNORMAL LOW (ref 12.0–15.0)

## 2020-12-19 MED ORDER — LEVETIRACETAM ER 500 MG PO TB24
500.0000 mg | ORAL_TABLET | Freq: Every day | ORAL | Status: DC
  Administered 2020-12-19 – 2020-12-23 (×5): 500 mg via ORAL
  Filled 2020-12-19 (×8): qty 1

## 2020-12-19 MED ORDER — SODIUM CHLORIDE 0.9% IV SOLUTION: Freq: Once | INTRAVENOUS | Status: DC

## 2020-12-19 MED ORDER — LEVETIRACETAM ER 500 MG PO TB24: 500.0000 mg | ORAL_TABLET | Freq: Every day | ORAL | 0 refills | Status: DC

## 2020-12-19 MED ORDER — DEXAMETHASONE 4 MG PO TABS: 4.0000 mg | ORAL_TABLET | Freq: Three times a day (TID) | ORAL | 0 refills | Status: DC

## 2020-12-19 NOTE — Discharge Summary (Addendum)
Physician Discharge Summary  Cassandra Dyer XBJ:[id number] DOB: [birth year] DOA: 12/17/2020  PCP: Biagio Borg, MD  Admit date: 12/17/2020 Discharge date: 12/19/2020  Admitted From: home Disposition:  home  Recommendations for Outpatient Follow-up:  1. Follow up with PCP in 1 week 2. Follow up with HD as usual MWF 3. Follow up with oncology as scheduled 4. Repeat CBC as outpatient and transfuse pRBC as needed to maintain Hgb > 7  Discharge Condition: Stable CODE STATUS: DNR  Diet recommendation:  Diet Orders (From admission, onward)    Start     Ordered   12/18/20 1555  Diet renal with fluid restriction Fluid restriction: 1200 mL Fluid; Room service appropriate? Yes; Fluid consistency: Thin  Diet effective now       Question Answer Comment  Fluid restriction: 1200 mL Fluid   Room service appropriate? Yes   Fluid consistency: Thin      12/18/20 1554         Brief/Interim Summary:  Cassandra Dyer is a 80 y.o.femalewith medical history significant ofstage IV lung cancer with brain metastasis, anemia, ESRD on HD Monday Wednesday Friday, COPD, hyperlipidemia, hypertension, hepatitis C, renal cell carcinoma,and thrombocytopeniawho presents following 2 to 3 days of left leg pain and right arm shaking. Patient reportedly has had to 3 days of left leg pain and right arm shaking. She missed her dialysis session due to this. While in the ED she acutely became altered and had an apparent seizure. Neurology was consulted; she got a Keppra load and she is now mentating appropriately.  She continued to remain stable on Keppra.  She underwent dialysis on 1/5.  Her hemoglobin returned to 6.6, repeat 7.1 without intervention.  She had no signs or symptoms of overt blood loss.  Discussed with nephrology, they will plan on repeating CBC as an outpatient and arrange for outpatient transfusion as needed.  Discharge Diagnoses:  Principal Problem:   Seizures ([place]) Active Problems:   Essential  hypertension   Dyslipidemia   Hyperkalemia   Anemia   ESRD (end stage renal disease) (HCC)   Volume overload   Brain metastases (HCC)   Hyponatremia   Seizure (HCC)   Pressure injury of skin   Seizure -Patient with history of lung cancer with brain mets -EEG: No seizures seen, did reveal evidence of epileptogenicity arising from left frontal region as well as cortical dysfunction in left frontal region likely secondary to underlying structural abnormalities.Additionally there is evidence of mild diffuse encephalopathy, nonspecific etiology -MRI brain: Probable mild disease progression as compared to previous MRI from 11/27/2019 as evidenced by mildly increased vasogenic edema about a few lesions situated at the left frontal lobe and left cerebellum as above. No midline shift or hydrocephalus. -Neurology consulted -Continue Keppra XR 500 mg daily, take immediately after dialysis on day of dialysis  ESRD, on HD MWF  -Nephrology consulted  Stage IV lung cancer with brain mets -Continue Decadron 4 mg 3 times daily -Follows with Dr. Julien Nordmann   Essential hypertension -Continue Coreg, Norvasc, ARB  Hyperlipidemia -Continue Crestor  Leukocytosis -In setting of decadron use. Monitor  -No signs or symptoms of infectious process  Anemia of chronic disease -Continue to monitor hemoglobin as outpatient and transfuse to maintain hemoglobin >7   In agreement with assessment of the pressure ulcer as below:  Pressure Injury 12/18/20 Buttocks Right;Left;Medial Stage 2 -  Partial thickness loss of dermis presenting as a shallow open injury with a red, pink wound bed without slough. (Active)  12/18/20 1748  Location: Buttocks  Location Orientation: Right;Left;Medial  Staging: Stage 2 -  Partial thickness loss of dermis presenting as a shallow open injury with a red, pink wound bed without slough.  Wound Description (Comments):   Present on Admission: Yes       Discharge  Instructions  Discharge Instructions    Call MD for:  difficulty breathing, headache or visual disturbances   Complete by: As directed    Call MD for:  extreme fatigue   Complete by: As directed    Call MD for:  persistant dizziness or light-headedness   Complete by: As directed    Call MD for:  persistant nausea and vomiting   Complete by: As directed    Call MD for:  severe uncontrolled pain   Complete by: As directed    Call MD for:  temperature >100.4   Complete by: As directed    Discharge instructions   Complete by: As directed    You were cared for by a hospitalist during your hospital stay. If you have any questions about your discharge medications or the care you received while you were in the hospital after you are discharged, you can call the unit and ask to speak with the hospitalist on call if the hospitalist that took care of you is not available. Once you are discharged, your primary care physician will handle any further medical issues. Please note that NO REFILLS for any discharge medications will be authorized once you are discharged, as it is imperative that you return to your primary care physician (or establish a relationship with a primary care physician if you do not have one) for your aftercare needs so that they can reassess your need for medications and monitor your lab values.   Discharge wound care:   Complete by: As directed    Wound care to full thickness lesions on coccygeal area:  Wash area with soap and water, rinse and pat dry. Cover with xeroform gauze Kellie Simmering # [id number]), top with dry gauze and cover with silicone foam dressing for sacrum.   Increase activity slowly   Complete by: As directed      Allergies as of 12/19/2020   No Known Allergies     Medication List    STOP taking these medications   docusate sodium 100 MG capsule Commonly known as: COLACE   enoxaparin 30 MG/0.3ML injection Commonly known as: LOVENOX   ondansetron 4 MG tablet Commonly  known as: Zofran   oxyCODONE-acetaminophen 5-325 MG tablet Commonly known as: Percocet     TAKE these medications   amLODipine 10 MG tablet Commonly known as: NORVASC Take 1 tablet (10 mg total) by mouth daily.   ascorbic acid 500 MG tablet Commonly known as: VITAMIN C Take 500 mg by mouth 2 (two) times daily.   Black Currant Seed Oil 500 MG Caps Take 500 mg by mouth every morning.   carvedilol 12.5 MG tablet Commonly known as: COREG Take 1 tablet (12.5 mg total) by mouth 2 (two) times daily with a meal.   dexamethasone 4 MG tablet Commonly known as: DECADRON Take 1 tablet (4 mg total) by mouth every 8 (eight) hours. What changed: when to take this   FISH OIL PO Take 1 capsule by mouth every morning.   levETIRAcetam 500 MG 24 hr tablet Commonly known as: KEPPRA XR Take 1 tablet (500 mg total) by mouth daily. On day of dialysis, take it immediately after dialysis.   multivitamin with  minerals Tabs tablet Take 1 tablet by mouth every morning.   OXYGEN Inhale 2 L into the lungs continuous.   pantoprazole 40 MG tablet Commonly known as: PROTONIX Take 1 tablet (40 mg total) by mouth daily.   polyethylene glycol 17 g packet Commonly known as: MIRALAX / GLYCOLAX Take 17 g by mouth daily as needed for mild constipation.   prochlorperazine 10 MG tablet Commonly known as: COMPAZINE Take 1 tablet (10 mg total) by mouth every 6 (six) hours as needed.   rosuvastatin 10 MG tablet Commonly known as: CRESTOR Take 1 tablet (10 mg total) by mouth daily.   senna-docusate 8.6-50 MG tablet Commonly known as: Senokot-S Take 1 tablet by mouth daily as needed for mild constipation.   telmisartan 80 MG tablet Commonly known as: MICARDIS Take 80 mg by mouth daily.   Vitamin D3 50 MCG (2000 UT) capsule Take 2,000 Units by mouth every morning.            Discharge Care Instructions  (From admission, onward)         Start     Ordered   12/19/20 0000  Discharge wound  care:       Comments: Wound care to full thickness lesions on coccygeal area:  Wash area with soap and water, rinse and pat dry. Cover with xeroform gauze Kellie Simmering # [id number]), top with dry gauze and cover with silicone foam dressing for sacrum.   12/19/20 1233          Follow-up Information    Biagio Borg, MD. Schedule an appointment as soon as possible for a visit in 1 week(s).   Specialties: Internal Medicine, Radiology Contact information: [place] [address] [phone]        Curt Bears, MD. Go on 12/26/2020.   Specialty: Oncology Contact information: [place] [address] [phone]              No Known Allergies  Consultations:  Neurology  Nephrology    Procedures/Studies: DG Chest 2 View  Result Date: 11/20/2020 CLINICAL DATA:  cp EXAM: CHEST - 2 VIEW COMPARISON:  11/18/2020 FINDINGS: Tunneled right IJ CVC tip overlies the superior cavoatrial junction. Cardiomegaly with central pulmonary vascular congestion. Redemonstration of small bilateral pleural effusions. New patchy bilateral pulmonary opacities. Multilevel spondylosis. IMPRESSION: 1. Cardiomegaly with central pulmonary vascular congestion. Redemonstration of small bilateral pleural effusions. 2. New confluent/patchy bilateral pulmonary opacities. Differential includes edema versus infection. Electronically Signed   By: Primitivo Gauze M.D.   On: 11/20/2020 13:29   CT Head Wo Contrast  Result Date: 12/17/2020 CLINICAL DATA:  Mental status change EXAM: CT HEAD WITHOUT CONTRAST TECHNIQUE: Contiguous axial images were obtained from the base of the skull through the vertex without intravenous contrast. COMPARISON:  MRI 11/26/2020 FINDINGS: Brain: No acute interval hemorrhage. Stable ventricle size. Grossly similar edema within the left parietal white matter. Slightly dense metastatic lesions are again noted. 1.4 x 1.3 cm left parietal mass, series 4, image  number 26. Small 7 mm hyperdense mass within the left frontal lobe, series 4, image number 25. Hyperdense mass within the right lateral ventricle measuring 1.6 x 2.3 cm. Approximate 14 mm left cerebellar lesion vaguely visualized. The other metastatic lesions on MRI are better seen on MRI. No significant midline shift. Vascular: No hyperdense vessels.  Carotid vascular calcification. Skull: Normal. Negative for fracture or focal lesion. Sinuses/Orbits: No acute finding. Other: None IMPRESSION: 1. No definite acute interval change as  compared with MRI from December 2021 allowing for limitations of inter modality comparison. Intracranial metastatic disease with grossly stable edema within the left parietal white matter. Only some of the metastatic lesions are appreciated on this non contrasted CT. 2. Mild chronic small vessel ischemic change of the white matter. Electronically Signed   By: Donavan Foil M.D.   On: 12/17/2020 19:41   MR BRAIN WO CONTRAST  Result Date: 12/17/2020 CLINICAL DATA:  Initial evaluation for acute seizure, history of metastatic lung carcinoma. EXAM: MRI HEAD WITHOUT CONTRAST TECHNIQUE: Multiplanar, multiecho pulse sequences of the brain and surrounding structures were obtained without intravenous contrast. COMPARISON:  Prior CT from earlier same day as well as recent brain MRI from 11/26/2020. FINDINGS: Brain: Again seen are multiple lesions scattered throughout the supratentorial and infratentorial brain, consistent with known intracranial metastatic disease. Overall number of these lesions appears grossly similar, although exact delineation limited due to lack of IV contrast. Associated vasogenic edema about a lesion centered at the posterior left frontoparietal region is similar to perhaps mildly increased. Additionally, there is mildly increased vasogenic edema seen about a few lesions at the left frontal lobe anteriorly (series 11, images 21, 22). Increased edema also seen about a  lesion at the peripheral left cerebellum (series 11, image 7). The lesion positioned along the posterior septum pellucidum may be slightly increased in size measuring 2.6 x 1.9 cm on today's exam, previously 2.4 x 1.7 cm when measured in a similar fashion. Overall, appearance suggest mild disease progression as compared to previous. No midline shift. Associated susceptibility artifact about a lesion at the left occipital lobe is stable from previous. No other associated hemorrhage. No evidence for acute or subacute infarct. No other diffusion abnormality to suggest changes related to seizure. No other evidence for acute or chronic intracranial hemorrhage. No hydrocephalus or ventricular trapping. No extra-axial fluid collection. Pituitary gland suprasellar region within normal limits. Vascular: Major intracranial vascular flow voids are maintained. Skull and upper cervical spine: Craniocervical junction within normal limits. Bone marrow signal intensity normal. No scalp soft tissue abnormality. Sinuses/Orbits: Patient status post bilateral ocular lens replacement. No acute abnormality about the globes and orbital soft tissues. Paranasal sinuses are largely clear. No significant mastoid effusion. Other: None. IMPRESSION: 1. Probable mild disease progression as compared to previous MRI from 11/27/2019 as evidenced by mildly increased vasogenic edema about a few lesions situated at the left frontal lobe and left cerebellum as above. No midline shift or hydrocephalus. 2. No other acute intracranial abnormality. Electronically Signed   By: Jeannine Boga M.D.   On: 12/17/2020 23:52   MR BRAIN WO CONTRAST  Result Date: 11/24/2020 CLINICAL DATA:  80 year old female with cerebral metastatic disease. Postoperative day 2 ORIF for pathologic right femoral fracture. EXAM: MRI HEAD WITHOUT CONTRAST TECHNIQUE: Multiplanar, multiecho pulse sequences of the brain and surrounding structures were obtained without  intravenous contrast. COMPARISON:  Brain MRI without contrast 01/23/2020. FINDINGS: Brain: Confluent vasogenic edema in the posterior left frontal and superior left parietal lobes surrounding a lobulated 14 mm presumed isointense cerebral metastasis is stable from 11/21/2020. No significant regional mass effect. Other predominantly T2 dark, FLAIR heterogeneous brain masses appear stable, some with associated hemosiderin as before (medial left occipital lobe). And additional lesions are possible given the absence of IV contrast on both of these exams. However, one lesion of the anterior left frontal lobe has substantially regressed in the short-term based on T2 and FLAIR imaging (series 6, image 21) with resolved adjacent edema.  No new areas of vasogenic edema. No significant intracranial mass effect. No superimposed restricted diffusion to suggest acute infarction. No ventriculomegaly, extra-axial collection or acute intracranial hemorrhage. Cervicomedullary junction and pituitary are within normal limits. Vascular: Major intracranial vascular flow voids are stable. Dominant right vertebral artery again suspected. Skull and upper cervical spine: Visualized bone marrow signal is within normal limits. Grossly normal visible cervical spine. Sinuses/Orbits: Stable, negative. Other: Mastoids remain clear. Grossly normal visible internal auditory structures. IMPRESSION: 1. Stable metastatic disease related cerebral edema since the noncontrast scan on 11/21/2020. No new or progressive lesions are evident in the absence of contrast. Postcontrast imaging likely would identify additional small metastases. 2. Regression of a single small anterior left frontal lobe lesion since 11/21/2020 with largely resolved edema there. 3. No new intracranial abnormality. Electronically Signed   By: Genevie Ann M.D.   On: 11/24/2020 15:53   MR BRAIN WO CONTRAST  Result Date: 11/21/2020 CLINICAL DATA:  Metastatic lung cancer, history of renal  cell carcinoma EXAM: MRI HEAD WITHOUT CONTRAST TECHNIQUE: Multiplanar, multiecho pulse sequences of the brain and surrounding structures were obtained without intravenous contrast. COMPARISON:  None. FINDINGS: Brain: Suboptimal evaluation for metastatic disease without intravenous contrast. Several lesions are identified and annotated primarily on axial T2 series 6: 1.4 x 0.8 cm lesion of the left postcentral gyrus with moderate surrounding edema on series 6, image 20; 0.5 cm lesion of the left superior frontal gyrus on series 6, image 22; 0.7 cm lesion of the left superior frontal gyrus more inferiorly with minimal edema on series 6, image 18; 2.6 x 1.7 cm lesion of the right lateral ventricle along the septum pellucidum on series 6, image 15; 0.5 cm lesion of the parasagittal left occipital lobe on series 6, image 13 with corresponding susceptibility reflecting chronic blood products or mineralization; 1 cm lesion of lateral left cerebellum with mild surrounding edema on series 7, image 6; Question of a small area of edema versus gliosis involving the lateral left precentral gyrus on series 7, image 16 without definite lesion. There is no acute infarction or intracranial hemorrhage. There is no hydrocephalus or extra-axial fluid collection. Prominence of the ventricles and sulci reflects minor generalized parenchymal volume loss. Vascular: Major vessel flow voids at the skull base are preserved. Skull and upper cervical spine: Normal marrow signal is preserved. Sinuses/Orbits: Trace mucosal thickening. Bilateral lens replacements. Other: Sella is unremarkable.  Mastoid air cells are clear. IMPRESSION: Six intracranial lesions are identified likely reflecting metastases. There is edema associated with the larger lesions but no significant mass effect. Question of a small area of edema versus gliosis in the posterior left frontal lobe without definite underlying lesion. Electronically Signed   By: Macy Mis  M.D.   On: 11/21/2020 13:04   MR BRAIN W WO CONTRAST  Result Date: 11/26/2020 CLINICAL DATA:  Metastatic lung cancer, abnormal noncontrast brain MRI EXAM: MRI HEAD WITHOUT AND WITH CONTRAST TECHNIQUE: Multiplanar, multiecho pulse sequences of the brain and surrounding structures were obtained without and with intravenous contrast. CONTRAST:  54mL GADAVIST GADOBUTROL 1 MMOL/ML IV SOLN COMPARISON:  Multiple recent noncontrast studies FINDINGS: Brain: Multiple lesions are again identified and are annotated on series 1100. There has been no change in size since recent prior imaging. A total of 8 lesions are identified on images 111, 146, 160, 177, 205, 217, 224, and 229. In comparison to the noncontrast imaging, this reflects identification of an additional left frontal and a right thalamic metastasis. The left occipital lesion  demonstrates intrinsic T1 shortening and corresponding susceptibility and could conceivably reflect a cavernous malformation but given associated edema, metastasis is more likely. Stable associated edema. No significant mass effect. Incidental note is made of a left frontal developmental venous anomaly with adjacent gliosis. No acute infarction hemorrhage. Vascular: Major vessel flow voids at the skull base are preserved. Skull and upper cervical spine: No new findings. Sinuses/Orbits: No new findings. Other: Sella is unremarkable.  Mastoid air cells are clear. IMPRESSION: 8 metastatic lesions with stable edema. Left frontal and right thalamic lesions identified in addition to those on prior noncontrast imaging. Electronically Signed   By: Macy Mis M.D.   On: 11/26/2020 13:29   Pelvis Portable  Result Date: 11/22/2020 CLINICAL DATA:  80 year old female status post hip replacement following pathologic fracture secondary to metastatic cancer. EXAM: PORTABLE PELVIS 1-2 VIEWS COMPARISON:  Intraoperative images 1359 hours today. FINDINGS: Portable AP view at 1552 hours. Bipolar right  hip arthroplasty. Hardware appears intact with normal AP alignment. No unexpected osseous changes. Regional postoperative soft tissue gas. Lytic lesion in the superior left femoral neck redemonstrated. Small volume retained oral contrast in the descending colon. Calcified uterine fibroid. IMPRESSION: 1. Bipolar right hip arthroplasty with no adverse features. 2. Proximal left femoral lytic metastasis. Electronically Signed   By: Genevie Ann M.D.   On: 11/22/2020 16:47   MR FEMUR RIGHT W WO CONTRAST  Result Date: 11/20/2020 CLINICAL DATA:  Fracture with metastatic disease EXAM: MRI OF THE RIGHT FEMUR WITHOUT AND WITH CONTRAST TECHNIQUE: Multiplanar, multisequence MR imaging of the right was performed both before and after administration of intravenous contrast. CONTRAST:  2mL GADAVIST GADOBUTROL 1 MMOL/ML IV SOLN COMPARISON:  None. FINDINGS: Bones/Joint/Cartilage There is a nondisplaced slightly impacted subcapital right femoral neck fracture as on the recent exam. There is a heterogeneous T1 isointense/heterogeneously T2 bright peripherally enhancing lesion within the medial right femoral neck. Mild surrounding marrow edema seen. No other fracture is identified. There is moderate bilateral hip osteoarthritis. Within the left femoral head/neck there is also a T1 hypointense/T2 heterogeneous lesion. A moderate right hip joint effusion is seen. Ligaments The ligamentum teres appears to be grossly intact Muscles and Tendons There is edema seen within the adductor musculature and iliopsoas musculature. The tendons appear to be grossly intact. Soft tissues Soft tissue swelling seen along the lateral aspect of the hip. Multiple hypointense uterine fibroids are seen. There is cystic lesion seen within the left adnexa which is partially visualized. IMPRESSION: Nondisplaced slightly impacted subcapital right femoral neck pathologic fracture. There is a osseous lesion within the right femoral neck, likely metastatic lesion .  Would recommend nuclear medicine bone scan for further evaluation other osseous lesions. Osseous lesion within the left femoral head/neck concerning for metastatic lesion. Moderate right hip osteoarthritis and joint effusion Electronically Signed   By: Prudencio Pair M.D.   On: 11/20/2020 20:18   DG Chest Port 1 View  Result Date: 12/17/2020 CLINICAL DATA:  Altered mental status, leg swelling EXAM: PORTABLE CHEST 1 VIEW COMPARISON:  November 20, 2020 FINDINGS: Bilateral layering pleural effusions with adjacent atelectasis. Probable mild pulmonary edema. No pneumothorax. Stable cardiomediastinal contours. Right tunneled line is unchanged. IMPRESSION: Bilateral layering pleural effusions with adjacent atelectasis. Probable mild pulmonary edema. Electronically Signed   By: Macy Mis M.D.   On: 12/17/2020 19:46   EEG adult  Result Date: 12/18/2020 Lora Havens, MD     12/18/2020 10:10 AM Patient Name: Cassandra Dyer MRN: [id number] Epilepsy Attending: Cecil Cranker  Hortense Ramal Referring Physician/Provider: Dr Neva Seat Date: 12/18/2020 Duration: 24.04 mins Patient history: 80 year old female who recently started radiation therapy for brain metastasis who presents with jerking type movement which would occur and last for a little bit before pausing for a little bit, on and off for about 5 minutes total. EEG to evaluate for seizure. Level of alertness: Awake, asleep AEDs during EEG study: LEV Technical aspects: This EEG study was done with scalp electrodes positioned according to the 10-20 International system of electrode placement. Electrical activity was acquired at a sampling rate of 500Hz  and reviewed with a high frequency filter of 70Hz  and a low frequency filter of 1Hz . EEG data were recorded continuously and digitally stored. Description: The posterior dominant rhythm consists of 8-9 Hz activity of moderate voltage (25-35 uV) seen predominantly in posterior head regions, symmetric and reactive to eye  opening and eye closing. Sleep was characterized by vertex waves, sleep spindles (12 to 14 Hz), maximal frontocentral region. EEG showed intermittent generalized and maximal left frontal 3 to 6 Hz theta-delta slowing.  Sharp waves are also noted in left frontal region. Hyperventilation and photic stimulation were not performed.   ABNORMALITY -Sharp wave, left frontal region -Intermittent slow, generalized and maximal left frontal region IMPRESSION: This study showed evidence of epileptogenicity arising from left frontal region as well as cortical dysfunction in left frontal region likely secondary to underlying structural abnormalities.  Additionally there is evidence of mild diffuse encephalopathy, nonspecific etiology.  No seizures were seen throughout the recording. Lora Havens   DG C-Arm 1-60 Min  Result Date: 11/22/2020 CLINICAL DATA:  Right hip hemiarthroplasty. EXAM: DG C-ARM 1-60 MIN; OPERATIVE RIGHT HIP WITH PELVIS FLUOROSCOPY TIME:  Fluoroscopy Time:  6.9 seconds COMPARISON:  November 18, 2020. FINDINGS: Four C-arm fluoroscopic images were obtained intraoperatively and submitted for post operative interpretation. These images demonstrate surgical changes of right hip arthroplasty. Please see the performing provider's procedural report for further detail. IMPRESSION: Intraoperative fluoroscopic images, as detailed above. Electronically Signed   By: Margaretha Sheffield MD   On: 11/22/2020 15:17   DG HIP OPERATIVE UNILAT WITH PELVIS RIGHT  Result Date: 11/22/2020 CLINICAL DATA:  Right hip hemiarthroplasty. EXAM: DG C-ARM 1-60 MIN; OPERATIVE RIGHT HIP WITH PELVIS FLUOROSCOPY TIME:  Fluoroscopy Time:  6.9 seconds COMPARISON:  November 18, 2020. FINDINGS: Four C-arm fluoroscopic images were obtained intraoperatively and submitted for post operative interpretation. These images demonstrate surgical changes of right hip arthroplasty. Please see the performing provider's procedural report for further  detail. IMPRESSION: Intraoperative fluoroscopic images, as detailed above. Electronically Signed   By: Margaretha Sheffield MD   On: 11/22/2020 15:17   [place] EKG SITE RITE  Result Date: 12/19/2020 If Site Rite image not attached, placement could not be confirmed due to current cardiac rhythm.      Discharge Exam: Vitals:   12/19/20 0807 12/19/20 0918  BP: (!) 160/82 (!) 120/45  Pulse: (!) 109 72  Resp: 20 18  Temp: 98.6 F (37 C) 98.5 F (36.9 C)  SpO2: 97% 93%    General: Pt is alert, awake, not in acute distress Cardiovascular: RRR, S1/S2 +, no edema Respiratory: CTA bilaterally, no wheezing, no rhonchi, no respiratory distress, no conversational dyspnea  Abdominal: Soft, NT, ND, bowel sounds + Extremities: no edema, no cyanosis Psych: Normal mood and affect, stable judgement and insight     The results of significant diagnostics from this hospitalization (including imaging, microbiology, ancillary and laboratory) are listed below for reference.  Microbiology: Recent Results (from the past 240 hour(s))  SARS CORONAVIRUS 2 (TAT 6-24 HRS) Nasopharyngeal Nasopharyngeal Swab     Status: None   Collection Time: 12/17/20  7:53 PM   Specimen: Nasopharyngeal Swab  Result Value Ref Range Status   SARS Coronavirus 2 NEGATIVE NEGATIVE Final    Comment: (NOTE) SARS-CoV-2 target nucleic acids are NOT DETECTED.  The SARS-CoV-2 RNA is generally detectable in upper and lower respiratory specimens during the acute phase of infection. Negative results do not preclude SARS-CoV-2 infection, do not rule out co-infections with other pathogens, and should not be used as the sole basis for treatment or other patient management decisions. Negative results must be combined with clinical observations, patient history, and epidemiological information. The expected result is Negative.  Fact Sheet for Patients: SugarRoll.be  Fact Sheet for Healthcare  Providers: https://www.woods-mathews.com/  This test is not yet approved or cleared by the [place] FDA and  has been authorized for detection and/or diagnosis of SARS-CoV-2 by FDA under an Emergency Use Authorization (EUA). This EUA will remain  in effect (meaning this test can be used) for the duration of the COVID-19 declaration under Se ction 564(b)(1) of the Act, 21 U.S.C. section 360bbb-3(b)(1), unless the authorization is terminated or revoked sooner.  Performed at [facility], [place] [address] [place], [place] [address]      Labs: BNP (last 3 results) No results for input(s): BNP in the last 8760 hours. Basic Metabolic Panel: Recent Labs  Lab 12/17/20 1636 12/18/20 0539 12/19/20 0237  NA 129* 131* 136  K 5.7* 5.1 3.7  CL 88* 87* 95*  CO2 19* 24 27  GLUCOSE 139* 147* 132*  BUN 123* 132* 54*  CREATININE 6.93* 7.35* 4.10*  CALCIUM 8.3* 8.2* 8.0*   Liver Function Tests: Recent Labs  Lab 12/18/20 0539  AST 20  ALT 22  ALKPHOS 151*  BILITOT 1.4*  PROT 4.7*  ALBUMIN 2.0*   No results for input(s): LIPASE, AMYLASE in the last 168 hours. Recent Labs  Lab 12/18/20 0057  AMMONIA 23   CBC: Recent Labs  Lab 12/17/20 1636 12/18/20 0539 12/19/20 0237 12/19/20 1019  WBC 56.2* 41.4* 33.7*  --   HGB 10.0* 8.1* 6.6* 7.1*  HCT 31.7* 24.2* 19.4* 21.9*  MCV 90.8 85.8 87.0  --   PLT 197 171 150  --    Cardiac Enzymes: No results for input(s): CKTOTAL, CKMB, CKMBINDEX, TROPONINI in the last 168 hours. BNP: Invalid input(s): POCBNP CBG: Recent Labs  Lab 12/17/20 1855  GLUCAP 158*   D-Dimer No results for input(s): DDIMER in the last 72 hours. Hgb A1c No results for input(s): HGBA1C in the last 72 hours. Lipid Profile No results for input(s): CHOL, HDL, LDLCALC, TRIG, CHOLHDL, LDLDIRECT in the last 72 hours. Thyroid function studies No results for input(s): TSH, T4TOTAL, T3FREE, THYROIDAB in the last 72 hours.  Invalid input(s):  FREET3 Anemia work up No results for input(s): VITAMINB12, FOLATE, FERRITIN, TIBC, IRON, RETICCTPCT in the last 72 hours. Urinalysis    Component Value Date/Time   COLORURINE YELLOW 07/28/2020 1131   APPEARANCEUR HAZY (A) 07/28/2020 1131   LABSPEC 1.016 07/28/2020 1131   PHURINE 5.0 07/28/2020 [place] 07/28/2020 [place] 04/26/2020 [place] 07/28/2020 [place] 07/28/2020 [place] 07/28/2020 1131   PROTEINUR NEGATIVE 07/28/2020 1131   UROBILINOGEN 0.2 04/26/2020 0909   NITRITE NEGATIVE 07/28/2020 1131  LEUKOCYTESUR LARGE (A) 07/28/2020 1131   Sepsis Labs Invalid input(s): PROCALCITONIN,  WBC,  LACTICIDVEN Microbiology Recent Results (from the past 240 hour(s))  SARS CORONAVIRUS 2 (TAT 6-24 HRS) Nasopharyngeal Nasopharyngeal Swab     Status: None   Collection Time: 12/17/20  7:53 PM   Specimen: Nasopharyngeal Swab  Result Value Ref Range Status   SARS Coronavirus 2 NEGATIVE NEGATIVE Final    Comment: (NOTE) SARS-CoV-2 target nucleic acids are NOT DETECTED.  The SARS-CoV-2 RNA is generally detectable in upper and lower respiratory specimens during the acute phase of infection. Negative results do not preclude SARS-CoV-2 infection, do not rule out co-infections with other pathogens, and should not be used as the sole basis for treatment or other patient management decisions. Negative results must be combined with clinical observations, patient history, and epidemiological information. The expected result is Negative.  Fact Sheet for Patients: SugarRoll.be  Fact Sheet for Healthcare Providers: https://www.woods-mathews.com/  This test is not yet approved or cleared by the [place] FDA and  has been authorized for detection and/or diagnosis of SARS-CoV-2 by FDA under an Emergency Use Authorization (EUA). This EUA will remain  in effect (meaning  this test can be used) for the duration of the COVID-19 declaration under Se ction 564(b)(1) of the Act, 21 U.S.C. section 360bbb-3(b)(1), unless the authorization is terminated or revoked sooner.  Performed at [facility], [place] [address], [place], [place] [address]      Patient was seen and examined on the day of discharge and was found to be in stable condition. Time coordinating discharge: 35 minutes including assessment and coordination of care, as well as examination of the patient.   SIGNED:  Dessa Phi, DO [facility] 12/19/2020, 12:34 PM

## 2020-12-19 NOTE — Progress Notes (Addendum)
Pt back from [facility]. VSS and pt denies any pain. No IV access MD aware, per MD to hold off on 1 unit of RBCs. Pt will not DC today and has HD scheduled for tomorrow.   Paulla Fore, RN, BSN

## 2020-12-19 NOTE — Progress Notes (Signed)
Call from lab reporting critical lab:  HGB: 6.6.  MD on call paged and made aware.

## 2020-12-19 NOTE — Progress Notes (Addendum)
NEUROLOGY CONSULTATION PROGRESS NOTE   Date of service: December 19, 2020 Patient Name: Cassandra Dyer MRN:  [id number] DOB:  11/19/1941  Brief HPI  Cassandra Dyer is a 80 y.o. female with PMH significant for  has a past medical history of Anemia, ESRD on hemodialysis ([facility]), Hepatitis, History of blood transfusion, History of kidney cancer, Hyperlipidemia, Hypertension, Renal cell carcinoma ([place]) (2018), and Stage 4 lung cancer ([place]). who presented with arm shaking with concerns of seizure.    Interval Hx  Cassandra Dyer is a 80 year old female who recently started radiation therapy for brain metastatic disease who presented with complaints of  arm shaking suggestive of seizure.  MRI brain revealed Probable mild disease progression as compared to previous MRI from 11/27/2019 as evidenced by mildly increased vasogenic edema about a few lesions situated at the left frontal lobe and left cerebellum. No midline shift or hydrocephalus noted. EEG showed evidence of epileptogenicity arising from left frontal region as well as cortical dysfunction in left frontal region likely secondary to underlying structural abnormalities. No seizures were seen on recording. Patient symptoms likely due to increased brain edema due to metastatic lesions.  Patient was started on Keppra 500 mg daily due to increased risk of seizure due to metastatic lesions.  Today, patient is doing better and does not complain of any arm shaking. Slight weakness in Rt hand and Rt Leg noted which which has been present since time of radiation.  No syncopal or seizure episodes noted since admission. Vitals   Vitals:   12/19/20 0500 12/19/20 0552 12/19/20 0807 12/19/20 0918  BP:  (!) 131/45 (!) 160/82 (!) 120/45  Pulse:  73 (!) 109 72  Resp:   20 18  Temp:  98.9 F (37.2 C) 98.6 F (37 C) 98.5 F (36.9 C)  TempSrc:  Oral Oral Oral  SpO2:  100% 97% 93%  Weight: 52.4 kg        Body mass index is 21.13 kg/m.  Physical Exam   General: Laying  comfortably in bed; in no acute distress. Oriented x4 HENT: Normal oropharynx and mucosa. Normal external appearance of ears and nose. Neck: Supple, no pain or tenderness  CV: No JVD. No peripheral edema.  Pulmonary: Symmetric Chest rise. Normal respiratory effort.  Abdomen: Soft to touch, non-tender.  Ext: No cyanosis, edema, or deformity  Skin: No rash. Normal palpation of skin.   Musculoskeletal: Normal digits and nails by inspection. No clubbing.   Neurologic Examination  Mental status/Cognition: Alert, oriented to self, place, month and year, good attention.   Speech/language: Fluent, comprehension intact, object naming intact. No Aphasia.  Cranial nerves:   CN II Pupils equal and reactive to light, no VF deficits    CN III,IV,VI EOM intact, no gaze preference or deviation, no nystagmus    CN V normal sensation in V1, V2, and V3 segments bilaterally    CN VII no asymmetry, no nasolabial fold flattening    CN VIII normal hearing to speech    CN IX & X normal palatal elevation, no uvular deviation    CN XI 5/5 head turn and 5/5 shoulder shrug bilaterally    CN XII midline tongue protrusion    Motor:   Bulk - Normal, Tone normal,  No Tremors Mild weakness in right arm and right leg.  Strength 4/5 in Rt Arm and 5/5 in Lt arm.   strength-3/5 in Rt leg and 5/5 in Left leg. Patient is able to lift both legs. Reflexes:  Right Left  Comments  Pectoralis      Biceps (C5/6) 2+ 2+   Brachioradialis (C5/6) 2+ 2+    Triceps (C6/7) 2+ 2+    Patellar (L3/4) absent absent    Achilles (S1) absent absent    Hoffman      Plantar     Jaw jerk    Sensation:  Light touch Sensation symmetric on both upper and lower limbs.   Pin prick    Temperature    Vibration   Proprioception    Coordination/Complex Motor:  - Finger to Nose normal - Rapid alternating movement  normal - Gait- Not tested  Labs   Basic Metabolic Panel:  Lab Results  Component Value Date   NA 136 12/19/2020   K  3.7 12/19/2020   CO2 27 12/19/2020   GLUCOSE 132 (H) 12/19/2020   BUN 54 (H) 12/19/2020   CREATININE 4.10 (H) 12/19/2020   CALCIUM 8.0 (L) 12/19/2020   GFRNONAA 11 (L) 12/19/2020   GFRAA 9 (L) 09/12/2020   HbA1c:  Lab Results  Component Value Date   HGBA1C 5.6 04/08/2020   LDL:  Lab Results  Component Value Date   LDLCALC 45 06/29/2019   Urine Drug Screen: No results found for: LABOPIA, COCAINSCRNUR, LABBENZ, AMPHETMU, THCU, LABBARB  Alcohol Level No results found for: ETH No results found for: PHENYTOIN, ZONISAMIDE, LAMOTRIGINE, LEVETIRACETA No results found for: PHENYTOIN, PHENOBARB, VALPROATE, CBMZ  Imaging and Diagnostic studies  Results for orders placed during the hospital encounter of 12/17/20  MR BRAIN WO CONTRAST  Narrative CLINICAL DATA:  Initial evaluation for acute seizure, history of metastatic lung carcinoma.  EXAM: MRI HEAD WITHOUT CONTRAST  TECHNIQUE: Multiplanar, multiecho pulse sequences of the brain and surrounding structures were obtained without intravenous contrast.  COMPARISON:  Prior CT from earlier same day as well as recent brain MRI from 11/26/2020.  FINDINGS: Brain: Again seen are multiple lesions scattered throughout the supratentorial and infratentorial brain, consistent with known intracranial metastatic disease. Overall number of these lesions appears grossly similar, although exact delineation limited due to lack of IV contrast. Associated vasogenic edema about a lesion centered at the posterior left frontoparietal region is similar to perhaps mildly increased. Additionally, there is mildly increased vasogenic edema seen about a few lesions at the left frontal lobe anteriorly (series 11, images 21, 22). Increased edema also seen about a lesion at the peripheral left cerebellum (series 11, image 7). The lesion positioned along the posterior septum pellucidum may be slightly increased in size measuring 2.6 x 1.9 cm on  today's exam, previously 2.4 x 1.7 cm when measured in a similar fashion. Overall, appearance suggest mild disease progression as compared to previous. No midline shift. Associated susceptibility artifact about a lesion at the left occipital lobe is stable from previous. No other associated hemorrhage.  No evidence for acute or subacute infarct. No other diffusion abnormality to suggest changes related to seizure. No other evidence for acute or chronic intracranial hemorrhage. No hydrocephalus or ventricular trapping. No extra-axial fluid collection. Pituitary gland suprasellar region within normal limits.  Vascular: Major intracranial vascular flow voids are maintained.  Skull and upper cervical spine: Craniocervical junction within normal limits. Bone marrow signal intensity normal. No scalp soft tissue abnormality.  Sinuses/Orbits: Patient status post bilateral ocular lens replacement. No acute abnormality about the globes and orbital soft tissues. Paranasal sinuses are largely clear. No significant mastoid effusion.  Other: None.  IMPRESSION: 1. Probable mild disease progression as compared to previous MRI from 11/27/2019 as evidenced  by mildly increased vasogenic edema about a few lesions situated at the left frontal lobe and left cerebellum as above. No midline shift or hydrocephalus. 2. No other acute intracranial abnormality.   Electronically Signed By: Jeannine Boga M.D. On: 12/17/2020 23:52   Impression   ANJOLIE MAJER is a 79 y.o. female with PMH significant for stage IV lung cancer with brain metastasis, ESRD, HTN, history of RCC.  She initially presented with shaking concerning for focal onset seizure. Her neurologic examination is  showed slight weakness in right arm and right leg which has been present since starting radiation. MRI brain showed mildly increased vasogenic edema about a few lesions situated at the left frontal lobe and left cerebellum.  EEG revealed evidence of epileptogenicity arising from left frontal region as well as cortical dysfunction in left frontal region. Patient symptoms likely due to increased brain edema due to metastatic lesions. No syncopal or seizure episodes noted since admission. Keppra was stared as Cassandra Dyer is high risk for seizures due to metastatic brain lesions. We recommend continuing Keppra 500 mg daily, and taking it after dialysis on dialysis days.  Recommendations  -Continue Keppra 500 mg daily, and on days of dialysis, she should take it immediately after dialysis.(discussed with patient and re-iterated multiple times). - continue Dexamethasone 4mg  TID.. -No further inpatient neurological workup needed at this time. Neurology will sign off. She should follow up with her outpatient neuro-oncologist. ______________________________________________________________________   Thank you for the opportunity to take part in the care of this patient. If you have any further questions, please contact the neurology consultation attending.  Signed,  Linna Darner MD PGY1 Resident [facility] Neurology

## 2020-12-19 NOTE — Evaluation (Signed)
Physical Therapy Evaluation Patient Details Name: Cassandra Dyer MRN: [id number] DOB: [birth year] Today's Date: 12/19/2020   History of Present Illness  Pt is a 80 y/o female admitted secondary to LLE pain and R arm shaking. Concern for seizure activity. Pt currently getting radiation for brain mets. PMH includes HTN, ESRD on HD MWF, renal cell carcinoma, and lung cancer.  Clinical Impression  Pt admitted secondary to problem above with deficits below. Pt fatiguing easily throughout session. Required prolonged seated rest after performing bed mobility tasks. Attempted to stand X3 with and without RW; unable to achieve full upright with max A. Feel pt will benefit from SNF level therapies prior to return home. Will continue to follow acutely.     Follow Up Recommendations SNF;Supervision/Assistance - 24 hour    Equipment Recommendations  Wheelchair (measurements PT);Wheelchair cushion (measurements PT);Hospital bed;Other (comment) (hoyer lift with pad)    Recommendations for Other Services       Precautions / Restrictions Precautions Precautions: Fall Restrictions Weight Bearing Restrictions: No      Mobility  Bed Mobility Overal bed mobility: Needs Assistance Bed Mobility: Supine to Sit;Sit to Supine     Supine to sit: Min assist Sit to supine: Min assist   General bed mobility comments: Min A for trunk assist. increased time to perform bed mobility tasks.    Transfers Overall transfer level: Needs assistance Equipment used: Rolling walker (2 wheeled);1 person hand held assist Transfers: Sit to/from Stand Sit to Stand: Max assist         General transfer comment: Attempted to stand X3. Able to clear hips, but not able to achieve full upright with max A.  Ambulation/Gait                Stairs            Wheelchair Mobility    Modified Rankin (Stroke Patients Only)       Balance Overall balance assessment: Needs assistance Sitting-balance  support: No upper extremity supported;Feet supported Sitting balance-Leahy Scale: Fair       Standing balance-Leahy Scale: Zero Standing balance comment: Unable to acheive full upright with max A                             Pertinent Vitals/Pain Pain Assessment: Faces Faces Pain Scale: Hurts little more Pain Location: LLE Pain Descriptors / Indicators: Aching;Grimacing;Guarding Pain Intervention(s): Limited activity within patient's tolerance;Monitored during session;Repositioned    Home Living Family/patient expects to be discharged to:: Private residence Living Arrangements: Non-relatives/Friends Available Help at Discharge: Friend(s);Available 24 hours/day Type of Home: House Home Access: Level entry     Home Layout: One level Home Equipment: Shower seat      Prior Function Level of Independence: Independent with assistive device(s)         Comments: Used RW for mobility since LLE started hurting.     Hand Dominance        Extremity/Trunk Assessment   Upper Extremity Assessment Upper Extremity Assessment: Generalized weakness    Lower Extremity Assessment Lower Extremity Assessment: Generalized weakness    Cervical / Trunk Assessment Cervical / Trunk Assessment: Normal  Communication   Communication: No difficulties  Cognition Arousal/Alertness: Awake/alert Behavior During Therapy: WFL for tasks assessed/performed Overall Cognitive Status: Within Functional Limits for tasks assessed  General Comments: Tearful throughout as she reports she does not want to feel like a burden. Very aware of deficits.      General Comments      Exercises     Assessment/Plan    PT Assessment Patient needs continued PT services  PT Problem List Decreased strength;Decreased activity tolerance;Decreased balance;Decreased mobility;Cardiopulmonary status limiting activity       PT Treatment Interventions  Gait training;DME instruction;Therapeutic activities;Functional mobility training;Therapeutic exercise;Balance training;Patient/family education    PT Goals (Current goals can be found in the Care Plan section)  Acute Rehab PT Goals Patient Stated Goal: regain independence PT Goal Formulation: With patient Time For Goal Achievement: 01/02/21 Potential to Achieve Goals: Good    Frequency Min 2X/week   Barriers to discharge        Co-evaluation               AM-PAC PT "6 Clicks" Mobility  Outcome Measure Help needed turning from your back to your side while in a flat bed without using bedrails?: A Little Help needed moving from lying on your back to sitting on the side of a flat bed without using bedrails?: A Little Help needed moving to and from a bed to a chair (including a wheelchair)?: Total Help needed standing up from a chair using your arms (e.g., wheelchair or bedside chair)?: Total Help needed to walk in hospital room?: Total Help needed climbing 3-5 steps with a railing? : Total 6 Click Score: 10    End of Session Equipment Utilized During Treatment: Gait belt;Oxygen Activity Tolerance: Patient limited by pain;Patient limited by fatigue Patient left: in bed;with call bell/phone within reach;with bed alarm set Nurse Communication: Mobility status PT Visit Diagnosis: Unsteadiness on feet (R26.81);Muscle weakness (generalized) (M62.81);Difficulty in walking, not elsewhere classified (R26.2)    Time: 8502-7741 PT Time Calculation (min) (ACUTE ONLY): 21 min   Charges:   PT Evaluation $PT Eval Moderate Complexity: 1 Mod          Reuel Derby, PT, DPT  Acute Rehabilitation Services  Pager: [phone] Office: [phone]   Rudean Hitt 12/19/2020, 2:21 PM

## 2020-12-19 NOTE — Progress Notes (Signed)
[facility] Progress Note   Subjective:  Seen in room - no overnight complaints, ate most of her breakfast. Did ok with dialysis yesterday - 1.4L removed. Hgb down to 6.6 this morning - looks like blood ordered, but issues getting IV access.  Objective Vitals:   12/19/20 0500 12/19/20 0552 12/19/20 0807 12/19/20 0918  BP:  (!) 131/45 (!) 160/82 (!) 120/45  Pulse:  73 (!) 109 72  Resp:   20 18  Temp:  98.9 F (37.2 C) 98.6 F (37 C) 98.5 F (36.9 C)  TempSrc:  Oral Oral Oral  SpO2:  100% 97% 93%  Weight: 52.4 kg      Physical Exam General: Elderly woman, NAD. On nasal O2 (uses at home). Heart: RRR; no murmur Lungs: CTA anteriorly, dull lateral bases Abdomen: soft, non-tender Extremities: No LE edema Dialysis Access: TDC in R chest, LUE AVF + thrill with significant bruising (s/p infiltration 1st use)  Additional Objective Labs: Basic Metabolic Panel: Recent Labs  Lab 12/17/20 1636 12/18/20 0539 12/19/20 0237  NA 129* 131* 136  K 5.7* 5.1 3.7  CL 88* 87* 95*  CO2 19* 24 27  GLUCOSE 139* 147* 132*  BUN 123* 132* 54*  CREATININE 6.93* 7.35* 4.10*  CALCIUM 8.3* 8.2* 8.0*   Liver Function Tests: Recent Labs  Lab 12/18/20 0539  AST 20  ALT 22  ALKPHOS 151*  BILITOT 1.4*  PROT 4.7*  ALBUMIN 2.0*   CBC: Recent Labs  Lab 12/17/20 1636 12/18/20 0539 12/19/20 0237  WBC 56.2* 41.4* 33.7*  HGB 10.0* 8.1* 6.6*  HCT 31.7* 24.2* 19.4*  MCV 90.8 85.8 87.0  PLT 197 171 150   Studies/Results: CT Head Wo Contrast  Result Date: 12/17/2020 CLINICAL DATA:  Mental status change EXAM: CT HEAD WITHOUT CONTRAST TECHNIQUE: Contiguous axial images were obtained from the base of the skull through the vertex without intravenous contrast. COMPARISON:  MRI 11/26/2020 FINDINGS: Brain: No acute interval hemorrhage. Stable ventricle size. Grossly similar edema within the left parietal white matter. Slightly dense metastatic lesions are again noted. 1.4 x 1.3 cm left  parietal mass, series 4, image number 26. Small 7 mm hyperdense mass within the left frontal lobe, series 4, image number 25. Hyperdense mass within the right lateral ventricle measuring 1.6 x 2.3 cm. Approximate 14 mm left cerebellar lesion vaguely visualized. The other metastatic lesions on MRI are better seen on MRI. No significant midline shift. Vascular: No hyperdense vessels.  Carotid vascular calcification. Skull: Normal. Negative for fracture or focal lesion. Sinuses/Orbits: No acute finding. Other: None IMPRESSION: 1. No definite acute interval change as compared with MRI from December 2021 allowing for limitations of inter modality comparison. Intracranial metastatic disease with grossly stable edema within the left parietal white matter. Only some of the metastatic lesions are appreciated on this non contrasted CT. 2. Mild chronic small vessel ischemic change of the white matter. Electronically Signed   By: Donavan Foil M.D.   On: 12/17/2020 19:41   MR BRAIN WO CONTRAST  Result Date: 12/17/2020 CLINICAL DATA:  Initial evaluation for acute seizure, history of metastatic lung carcinoma. EXAM: MRI HEAD WITHOUT CONTRAST TECHNIQUE: Multiplanar, multiecho pulse sequences of the brain and surrounding structures were obtained without intravenous contrast. COMPARISON:  Prior CT from earlier same day as well as recent brain MRI from 11/26/2020. FINDINGS: Brain: Again seen are multiple lesions scattered throughout the supratentorial and infratentorial brain, consistent with known intracranial metastatic disease. Overall number of these lesions appears grossly similar,  although exact delineation limited due to lack of IV contrast. Associated vasogenic edema about a lesion centered at the posterior left frontoparietal region is similar to perhaps mildly increased. Additionally, there is mildly increased vasogenic edema seen about a few lesions at the left frontal lobe anteriorly (series 11, images 21, 22).  Increased edema also seen about a lesion at the peripheral left cerebellum (series 11, image 7). The lesion positioned along the posterior septum pellucidum may be slightly increased in size measuring 2.6 x 1.9 cm on today's exam, previously 2.4 x 1.7 cm when measured in a similar fashion. Overall, appearance suggest mild disease progression as compared to previous. No midline shift. Associated susceptibility artifact about a lesion at the left occipital lobe is stable from previous. No other associated hemorrhage. No evidence for acute or subacute infarct. No other diffusion abnormality to suggest changes related to seizure. No other evidence for acute or chronic intracranial hemorrhage. No hydrocephalus or ventricular trapping. No extra-axial fluid collection. Pituitary gland suprasellar region within normal limits. Vascular: Major intracranial vascular flow voids are maintained. Skull and upper cervical spine: Craniocervical junction within normal limits. Bone marrow signal intensity normal. No scalp soft tissue abnormality. Sinuses/Orbits: Patient status post bilateral ocular lens replacement. No acute abnormality about the globes and orbital soft tissues. Paranasal sinuses are largely clear. No significant mastoid effusion. Other: None. IMPRESSION: 1. Probable mild disease progression as compared to previous MRI from 11/27/2019 as evidenced by mildly increased vasogenic edema about a few lesions situated at the left frontal lobe and left cerebellum as above. No midline shift or hydrocephalus. 2. No other acute intracranial abnormality. Electronically Signed   By: Jeannine Boga M.D.   On: 12/17/2020 23:52   DG Chest Port 1 View  Result Date: 12/17/2020 CLINICAL DATA:  Altered mental status, leg swelling EXAM: PORTABLE CHEST 1 VIEW COMPARISON:  November 20, 2020 FINDINGS: Bilateral layering pleural effusions with adjacent atelectasis. Probable mild pulmonary edema. No pneumothorax. Stable  cardiomediastinal contours. Right tunneled line is unchanged. IMPRESSION: Bilateral layering pleural effusions with adjacent atelectasis. Probable mild pulmonary edema. Electronically Signed   By: Macy Mis M.D.   On: 12/17/2020 19:46   EEG adult  Result Date: 12/18/2020 Lora Havens, MD     12/18/2020 10:10 AM Patient Name: Cassandra Dyer MRN: [id number] Epilepsy Attending: Lora Havens Referring Physician/Provider: Dr Neva Seat Date: 12/18/2020 Duration: 24.04 mins Patient history: 80 year old female who recently started radiation therapy for brain metastasis who presents with jerking type movement which would occur and last for a little bit before pausing for a little bit, on and off for about 5 minutes total. EEG to evaluate for seizure. Level of alertness: Awake, asleep AEDs during EEG study: LEV Technical aspects: This EEG study was done with scalp electrodes positioned according to the 10-20 International system of electrode placement. Electrical activity was acquired at a sampling rate of 500Hz  and reviewed with a high frequency filter of 70Hz  and a low frequency filter of 1Hz . EEG data were recorded continuously and digitally stored. Description: The posterior dominant rhythm consists of 8-9 Hz activity of moderate voltage (25-35 uV) seen predominantly in posterior head regions, symmetric and reactive to eye opening and eye closing. Sleep was characterized by vertex waves, sleep spindles (12 to 14 Hz), maximal frontocentral region. EEG showed intermittent generalized and maximal left frontal 3 to 6 Hz theta-delta slowing.  Sharp waves are also noted in left frontal region. Hyperventilation and photic stimulation were not performed.  ABNORMALITY -Sharp wave, left frontal region -Intermittent slow, generalized and maximal left frontal region IMPRESSION: This study showed evidence of epileptogenicity arising from left frontal region as well as cortical dysfunction in left frontal region  likely secondary to underlying structural abnormalities.  Additionally there is evidence of mild diffuse encephalopathy, nonspecific etiology.  No seizures were seen throughout the recording. Priyanka O Yadav   [place] EKG SITE RITE  Result Date: 12/19/2020 If Site Rite image not attached, placement could not be confirmed due to current cardiac rhythm.  Medications:  . (feeding supplement) PROSource Plus  30 mL Oral BID BM  . sodium chloride   Intravenous Once  . amLODipine  10 mg Oral Daily  . carvedilol  12.5 mg Oral BID WC  . Chlorhexidine Gluconate Cloth  6 each Topical Q0600  . dexamethasone  4 mg Oral Q8H  . docusate sodium  100 mg Oral BID  . heparin  5,000 Units Subcutaneous Q8H  . irbesartan  300 mg Oral Daily  . levETIRAcetam  500 mg Oral Daily  . pantoprazole  40 mg Oral Daily  . rosuvastatin  10 mg Oral Daily  . sodium chloride flush  3 mL Intravenous Q12H    Dialysis Orders: MWF @ [facility] 4[facility], 350/800, EDW 48.5kg, 2K/2Ca, TDC + LUE AVF, heparin 2000 - Venofer 50mg  IV q week - No ESA d/t cancer  Assessment/Plan: 1.  Seizure: Started on PO Keppra - per neuro. 2.  Metastatic lung cancer to brain: On dexamethazone TID, starting palliative chemo soon. 3.  ESRD: Continue HD per MWF schedule - next 1/7. BUN improved today. 4.  Hypertension/volume: BP better today. B effusions in lungs but unclear if we can touch this with dialysis, likely malignant effusions - UF as tolerated. 5.  Anemia: Hgb 10 -> 8.1 -> 6.6 today. ?GI loss. Transfusion already ordered. Typically ESAs are held in setting of cancer. In her case however, may need to weigh against risks of repeated transfusions, esp when cure is not expected. 6.  Metabolic bone disease: CorrCa slightly high, no VDRA - follow. 7.  Nutrition: Alb low, continue protein supplements. 8.  DNR   Veneta Penton, PA-C 12/19/2020, 10:17 AM  [facility]

## 2020-12-19 NOTE — Progress Notes (Signed)
DC cancelled after PT assessed pt and recommended SNF. Pt to transport via [facility] to [facility] for radiation tx.   Paulla Fore, RN, BSN

## 2020-12-19 NOTE — Progress Notes (Signed)
Patient is scheduled for a radiation treatment today at 3:15.  I left a message for the nurse to set up carelink to have the patient here at 2:45 pm.  Call back number [phone]) left for questions or concerns.  Will follow as necessary.  Gloriajean Dell. Leonie Green, BSN

## 2020-12-19 NOTE — Progress Notes (Signed)
  PROGRESS NOTE  Patient was set to discharge home today, medically stabilized. However, after working with PT it was determined that she would need SNF placement. DC canceled and TOC consulted for placement.    Dessa Phi, DO [facility] 12/19/2020, 2:45 PM  Available via Epic secure chat 7am-7pm After these hours, please refer to coverage provider listed on [email or web address]

## 2020-12-19 NOTE — Progress Notes (Signed)
Notified MD Choi via secure chat that per IV team pt is not a candidate for PICC/midline access. Pt will need another access in order to receive blood transfusion.

## 2020-12-19 NOTE — Progress Notes (Signed)
Spoke with primary RN about PICC placement . Patient is an active renal patient with right [place] and left fistula therefore is not a candidate for a PICC line or a Midline . Recommendation would have a line placed on the left chest or IJ by IR or MD. Staff nurse to notify MD .

## 2020-12-19 NOTE — Consult Note (Signed)
WOC Nurse Consult Note: Reason for Consult: several partial thickness and full thickness open lesions on sacrum Wound type: full thickness open areas secondary to moisture, friction and pressure Pressure Injury POA: N/A Measurement: 5cm x 5cm area over coccygeal region with 4 irregularly shaped lesions. The largest measures 1.5cm x 2cm x 0.2cm. Moist, pink wound beds, scant serous exudate. Presentation is not consistent with that of herpetic lesions, and are more in alignment with friction, pressure, moisture lesions. If wounds do not begin to improve, consider alternative etiology. Wound bed:As described above Drainage (amount, consistency, odor) As described above Periwound:intact, moist Dressing procedure/placement/frequency: I have implemented a POC directed to drying and protecting the lesions using turning and repositioning and topical care with an antimicrobial nonadherent dressing (xeroform) topped with dry gauze and covered with a silicone foam dressing for the sacrum. I have provided a pressure redistribution chair cushion for times when the patient is OOB to a chair.  [facility] nursing team will not follow, but will remain available to this patient, the nursing and medical teams.  Please re-consult if needed. Thanks, Maudie Flakes, MSN, RN, [place], Arther Abbott  Pager# [phone]

## 2020-12-20 ENCOUNTER — Inpatient Hospital Stay: Payer: Medicare Other

## 2020-12-20 DIAGNOSIS — R569 Unspecified convulsions: Secondary | ICD-10-CM | POA: Diagnosis not present

## 2020-12-20 LAB — CBC
HCT: 21.1 % — ABNORMAL LOW (ref 36.0–46.0)
Hemoglobin: 6.8 g/dL — CL (ref 12.0–15.0)
MCH: 28.8 pg (ref 26.0–34.0)
MCHC: 32.2 g/dL (ref 30.0–36.0)
MCV: 89.4 fL (ref 80.0–100.0)
Platelets: 156 10*3/uL (ref 150–400)
RBC: 2.36 MIL/uL — ABNORMAL LOW (ref 3.87–5.11)
RDW: 17.2 % — ABNORMAL HIGH (ref 11.5–15.5)
WBC: 35.7 10*3/uL — ABNORMAL HIGH (ref 4.0–10.5)
nRBC: 0 % (ref 0.0–0.2)

## 2020-12-20 LAB — BASIC METABOLIC PANEL
Anion gap: 18 — ABNORMAL HIGH (ref 5–15)
BUN: 83 mg/dL — ABNORMAL HIGH (ref 8–23)
CO2: 21 mmol/L — ABNORMAL LOW (ref 22–32)
Calcium: 8.2 mg/dL — ABNORMAL LOW (ref 8.9–10.3)
Chloride: 92 mmol/L — ABNORMAL LOW (ref 98–111)
Creatinine, Ser: 5.5 mg/dL — ABNORMAL HIGH (ref 0.44–1.00)
GFR, Estimated: 8 mL/min — ABNORMAL LOW (ref >60)
Glucose, Bld: 189 mg/dL — ABNORMAL HIGH (ref 70–99)
Potassium: 4.2 mmol/L (ref 3.5–5.1)
Sodium: 131 mmol/L — ABNORMAL LOW (ref 135–145)

## 2020-12-20 LAB — HEMOGLOBIN AND HEMATOCRIT, BLOOD
HCT: 27.6 % — ABNORMAL LOW (ref 36.0–46.0)
Hemoglobin: 9.4 g/dL — ABNORMAL LOW (ref 12.0–15.0)

## 2020-12-20 LAB — PREPARE RBC (CROSSMATCH)

## 2020-12-20 MED ORDER — HEPARIN SODIUM (PORCINE) 1000 UNIT/ML IJ SOLN
INTRAMUSCULAR | Status: AC
  Administered 2020-12-20: 1000 [IU]
  Filled 2020-12-20: qty 4

## 2020-12-20 MED ORDER — SODIUM CHLORIDE 0.9% IV SOLUTION: Freq: Once | INTRAVENOUS | Status: DC

## 2020-12-20 NOTE — Plan of Care (Signed)
  Problem: Education: Goal: Knowledge of disease and its progression will improve Outcome: Progressing   Problem: Health Behavior/Discharge Planning: Goal: Ability to manage health-related needs will improve Outcome: Progressing

## 2020-12-20 NOTE — Progress Notes (Signed)
Late Entry   CRITICAL VALUE STICKER  CRITICAL VALUE:   Hgb= 6.8  RECEIVER (on-site recipient of call): Emilio Math, RN   DATE & TIME NOTIFIED: 12/21/19 0905  MESSENGER (representative from lab):  MD NOTIFIED:  MD Maylene Roes   TIME OF NOTIFICATION: 0908  RESPONSE: Type and screen ordered by MD and pt will get 1 unit of PRBCs in HD.

## 2020-12-20 NOTE — Progress Notes (Signed)
[facility] Progress Note   Subjective:  Seen in room - no overnight complaints. Eating breakfast. No sob, cp. For dialysis today. Transfuse 1 u prbcs on dialysis.   Objective Vitals:   12/19/20 1713 12/19/20 1900 12/19/20 2040 12/20/20 0523  BP: (!) 127/48  (!) 135/51 (!) 145/67  Pulse: 71  65 70  Resp: 18     Temp: 98.2 F (36.8 C)  98.7 F (37.1 C) 98.5 F (36.9 C)  TempSrc: Oral  Oral Oral  SpO2: 93%  98% 94%  Weight:  55.1 kg     Physical Exam General: Elderly woman, NAD. On nasal O2 (uses at home). Heart: RRR; no murmur Lungs: CTA anteriorly, dull lateral bases Abdomen: soft, non-tender Extremities: No LE edema Dialysis Access: TDC in R chest, LUE AVF + thrill with significant bruising (s/p infiltration 1st use)  Additional Objective Labs: Basic Metabolic Panel: Recent Labs  Lab 12/17/20 1636 12/18/20 0539 12/19/20 0237  NA 129* 131* 136  K 5.7* 5.1 3.7  CL 88* 87* 95*  CO2 19* 24 27  GLUCOSE 139* 147* 132*  BUN 123* 132* 54*  CREATININE 6.93* 7.35* 4.10*  CALCIUM 8.3* 8.2* 8.0*   Liver Function Tests: Recent Labs  Lab 12/18/20 0539  AST 20  ALT 22  ALKPHOS 151*  BILITOT 1.4*  PROT 4.7*  ALBUMIN 2.0*   CBC: Recent Labs  Lab 12/17/20 1636 12/18/20 0539 12/19/20 0237 12/19/20 1019  WBC 56.2* 41.4* 33.7*  --   HGB 10.0* 8.1* 6.6* 7.1*  HCT 31.7* 24.2* 19.4* 21.9*  MCV 90.8 85.8 87.0  --   PLT 197 171 150  --    Studies/Results: EEG adult  Result Date: 12/18/2020 Lora Havens, MD     12/18/2020 10:10 AM Patient Name: Cassandra Dyer MRN: [id number] Epilepsy Attending: Lora Havens Referring Physician/Provider: Dr Neva Seat Date: 12/18/2020 Duration: 24.04 mins Patient history: 80 year old female who recently started radiation therapy for brain metastasis who presents with jerking type movement which would occur and last for a little bit before pausing for a little bit, on and off for about 5 minutes total. EEG to  evaluate for seizure. Level of alertness: Awake, asleep AEDs during EEG study: LEV Technical aspects: This EEG study was done with scalp electrodes positioned according to the 10-20 International system of electrode placement. Electrical activity was acquired at a sampling rate of 500Hz  and reviewed with a high frequency filter of 70Hz  and a low frequency filter of 1Hz . EEG data were recorded continuously and digitally stored. Description: The posterior dominant rhythm consists of 8-9 Hz activity of moderate voltage (25-35 uV) seen predominantly in posterior head regions, symmetric and reactive to eye opening and eye closing. Sleep was characterized by vertex waves, sleep spindles (12 to 14 Hz), maximal frontocentral region. EEG showed intermittent generalized and maximal left frontal 3 to 6 Hz theta-delta slowing.  Sharp waves are also noted in left frontal region. Hyperventilation and photic stimulation were not performed.   ABNORMALITY -Sharp wave, left frontal region -Intermittent slow, generalized and maximal left frontal region IMPRESSION: This study showed evidence of epileptogenicity arising from left frontal region as well as cortical dysfunction in left frontal region likely secondary to underlying structural abnormalities.  Additionally there is evidence of mild diffuse encephalopathy, nonspecific etiology.  No seizures were seen throughout the recording. Priyanka O Yadav   [place] EKG SITE RITE  Result Date: 12/19/2020 [facility] image not attached, placement could not be confirmed due  to current cardiac rhythm.  Medications:  . (feeding supplement) PROSource Plus  30 mL Oral BID BM  . sodium chloride   Intravenous Once  . amLODipine  10 mg Oral Daily  . carvedilol  12.5 mg Oral BID WC  . Chlorhexidine Gluconate Cloth  6 each Topical Q0600  . dexamethasone  4 mg Oral Q8H  . docusate sodium  100 mg Oral BID  . heparin  5,000 Units Subcutaneous Q8H  . irbesartan  300 mg Oral Daily  .  levETIRAcetam  500 mg Oral Daily  . pantoprazole  40 mg Oral Daily  . rosuvastatin  10 mg Oral Daily  . sodium chloride flush  3 mL Intravenous Q12H    Dialysis Orders: MWF @ [facility] 4hr, 350/800, EDW 48.5kg, 2K/2Ca, TDC + LUE AVF, heparin 2000 - Venofer 50mg  IV q week - No ESA d/t cancer  Assessment/Plan: 1.  Seizure: Started on PO Keppra - per neuro. 2.  Metastatic lung cancer to brain: On dexamethazone TID, starting palliative chemo soon. 3.  ESRD: Continue HD per MWF schedule - next today.  4.  Hypertension/volume: BP better today. B effusions in lungs but unclear if we can touch this with dialysis, likely malignant effusions - UF as tolerated. 5.  Anemia: Hgb 10 -> 8.1 -> 7.1  ?GI loss. Transfusion already ordered. Typically ESAs are held in setting of cancer. In her case however, may need to weigh against risks of repeated transfusions, esp when cure is not expected. 6.  Metabolic bone disease: CorrCa slightly high, no VDRA - follow. 7.  Nutrition: Alb low, continue protein supplements. 8.  DNR 9. Dispo: SNF placement pending    Takeyah Wieman Larina Earthly PA-C [facility] 12/20/2020,8:50 AM

## 2020-12-20 NOTE — Progress Notes (Signed)
PROGRESS NOTE    OLUWATOMISIN HUSTEAD  ZOX:[id number] DOB: [birth year] DOA: 12/17/2020 PCP: Biagio Borg, MD     Brief Narrative:  PAYTEN HOBIN is a 80 y.o. female with medical history significant of stage IV lung cancer with brain metastasis, anemia, ESRD on HD Monday Wednesday Friday, COPD, hyperlipidemia, hypertension, hepatitis C, renal cell carcinoma, and thrombocytopenia who presents following 2 to 3 days of left leg pain and right arm shaking. Patient reportedly has had to 3 days of left leg pain and right arm shaking.  She missed her dialysis session due to this. While in the ED she acutely became altered and had an apparent seizure.  Neurology was consulted; she got a Keppra load and she is now mentating appropriately.  Patient was supposed to discharge home on 1/6.  However, after evaluation with PT, it was determined that she would need SNF placement.  New events last 24 hours / Subjective: Patient sitting in bed, eating breakfast without any new complaints.  Complains of diarrhea has improved.  Remains weak.  Assessment & Plan:   Principal Problem:   Seizures ([place]) Active Problems:   Essential hypertension   Dyslipidemia   Hyperkalemia   Anemia   ESRD (end stage renal disease) (HCC)   Volume overload   Brain metastases (HCC)   Hyponatremia   Seizure (HCC)   Pressure injury of skin   Seizure -Patient with history of lung cancer with brain mets -EEG:No seizures seen, did revealevidence of epileptogenicity arising from left frontal region as well as cortical dysfunction in left frontal region likely secondary to underlying structural abnormalities.Additionally there is evidence of mild diffuse encephalopathy, nonspecific etiology -MRIbrain:Probable mild disease progression as compared to previous MRI from 11/27/2019 as evidenced by mildly increased vasogenic edema about a few lesions situated at the left frontal lobe and left cerebellum as above. No midline shift or  hydrocephalus. -Neurology consulted -Continue Keppra XR 500 mg daily, take immediately after dialysis on day of dialysis  ESRD, on HD MWF -Nephrology consulted  Stage IV lung cancer with brain mets -Continue Decadron 4 mg 3 times daily -Follows withDr. Julien Nordmann  Essential hypertension -Continue Coreg, Norvasc, ARB  Hyperlipidemia -Continue Crestor  Leukocytosis -In setting of decadron use. Monitor -No signs or symptoms of infectious process  Anemia of chronic disease -Continue to monitor hemoglobin as outpatient and transfuse to maintain hemoglobin >7 -Transfuse 1 unit packed red blood cell today    DVT prophylaxis:  heparin injection 5,000 Units Start: 12/18/20 1400  Code Status: DNR Family Communication: No family at bedside Disposition Plan:  Status is: Inpatient  Remains inpatient appropriate because:Unsafe d/c plan   Dispo: The patient is from: Home              Anticipated d/c is to: SNF              Anticipated d/c date is: 1 day              Patient currently is medically stable to d/c.  Patient to receive 1 unit packed red blood cells with dialysis today. SNF placement pending.        Consultants:   Neurology  Nephrology   Antimicrobials:  Anti-infectives (From admission, onward)   None       Objective: Vitals:   12/20/20 0955 12/20/20 1000 12/20/20 1030 12/20/20 1100  BP: 140/62 (!) 143/62 (!) 147/63 (!) 143/61  Pulse: 72 70 74 73  Resp: 18 16    Temp: 98.1  F (36.7 C)     TempSrc: Oral     SpO2: 98%     Weight: 53.3 kg       Intake/Output Summary (Last 24 hours) at 12/20/2020 1127 Last data filed at 12/20/2020 0930 Gross per 24 hour  Intake 886 ml  Output --  Net 886 ml   Filed Weights   12/19/20 0500 12/19/20 1900 12/20/20 0955  Weight: 52.4 kg 55.1 kg 53.3 kg    Examination: General exam: Appears calm and comfortable  Respiratory system: Clear to auscultation. Respiratory effort normal. Cardiovascular system:  S1 & S2 heard, RRR. No pedal edema. Gastrointestinal system: Abdomen is nondistended, soft and nontender. Normal bowel sounds heard. Central nervous system: Alert and oriented. Non focal exam. Speech clear  Extremities: Symmetric in appearance bilaterally  Skin: No rashes, lesions or ulcers on exposed skin  Psychiatry: Judgement and insight appear stable. Mood & affect appropriate.     Data Reviewed: I have personally reviewed following labs and imaging studies  CBC: Recent Labs  Lab 12/17/20 1636 12/18/20 0539 12/19/20 0237 12/19/20 1019 12/20/20 0829  WBC 56.2* 41.4* 33.7*  --  35.7*  HGB 10.0* 8.1* 6.6* 7.1* 6.8*  HCT 31.7* 24.2* 19.4* 21.9* 21.1*  MCV 90.8 85.8 87.0  --  89.4  PLT 197 171 150  --  712   Basic Metabolic Panel: Recent Labs  Lab 12/17/20 1636 12/18/20 0539 12/19/20 0237 12/20/20 0829  NA 129* 131* 136 131*  K 5.7* 5.1 3.7 4.2  CL 88* 87* 95* 92*  CO2 19* 24 27 21*  GLUCOSE 139* 147* 132* 189*  BUN 123* 132* 54* 83*  CREATININE 6.93* 7.35* 4.10* 5.50*  CALCIUM 8.3* 8.2* 8.0* 8.2*   GFR: Estimated Creatinine Clearance: 6.8 mL/min (A) (by C-G formula based on SCr of 5.5 mg/dL (H)). Liver Function Tests: Recent Labs  Lab 12/18/20 0539  AST 20  ALT 22  ALKPHOS 151*  BILITOT 1.4*  PROT 4.7*  ALBUMIN 2.0*   No results for input(s): LIPASE, AMYLASE in the last 168 hours. Recent Labs  Lab 12/18/20 0057  AMMONIA 23   Coagulation Profile: Recent Labs  Lab 12/18/20 0057  INR 1.2   Cardiac Enzymes: No results for input(s): CKTOTAL, CKMB, CKMBINDEX, TROPONINI in the last 168 hours. BNP (last 3 results) No results for input(s): PROBNP in the last 8760 hours. HbA1C: No results for input(s): HGBA1C in the last 72 hours. CBG: Recent Labs  Lab 12/17/20 1855  GLUCAP 158*   Lipid Profile: No results for input(s): CHOL, HDL, LDLCALC, TRIG, CHOLHDL, LDLDIRECT in the last 72 hours. Thyroid Function Tests: No results for input(s): TSH,  T4TOTAL, FREET4, T3FREE, THYROIDAB in the last 72 hours. Anemia Panel: No results for input(s): VITAMINB12, FOLATE, FERRITIN, TIBC, IRON, RETICCTPCT in the last 72 hours. Sepsis Labs: No results for input(s): PROCALCITON, LATICACIDVEN in the last 168 hours.  Recent Results (from the past 240 hour(s))  SARS CORONAVIRUS 2 (TAT 6-24 HRS) Nasopharyngeal Nasopharyngeal Swab     Status: None   Collection Time: 12/17/20  7:53 PM   Specimen: Nasopharyngeal Swab  Result Value Ref Range Status   SARS Coronavirus 2 NEGATIVE NEGATIVE Final    Comment: (NOTE) SARS-CoV-2 target nucleic acids are NOT DETECTED.  The SARS-CoV-2 RNA is generally detectable in upper and lower respiratory specimens during the acute phase of infection. Negative results do not preclude SARS-CoV-2 infection, do not rule out co-infections with other pathogens, and should not be used as the sole basis  for treatment or other patient management decisions. Negative results must be combined with clinical observations, patient history, and epidemiological information. The expected result is Negative.  Fact Sheet for Patients: SugarRoll.be  Fact Sheet for Healthcare Providers: https://www.woods-mathews.com/  This test is not yet approved or cleared by the [place] FDA and  has been authorized for detection and/or diagnosis of SARS-CoV-2 by FDA under an Emergency Use Authorization (EUA). This EUA will remain  in effect (meaning this test can be used) for the duration of the COVID-19 declaration under Se ction 564(b)(1) of the Act, 21 U.S.C. section 360bbb-3(b)(1), unless the authorization is terminated or revoked sooner.  Performed at [facility], [place] [address] [place], [place] [address]       Radiology Studies: [place] EKG SITE RITE  Result Date: 12/19/2020 If Site Rite image not attached, placement could not be confirmed due to current cardiac  rhythm.     Scheduled Meds: . (feeding supplement) PROSource Plus  30 mL Oral BID BM  . sodium chloride   Intravenous Once  . sodium chloride   Intravenous Once  . amLODipine  10 mg Oral Daily  . carvedilol  12.5 mg Oral BID WC  . Chlorhexidine Gluconate Cloth  6 each Topical Q0600  . dexamethasone  4 mg Oral Q8H  . docusate sodium  100 mg Oral BID  . heparin  5,000 Units Subcutaneous Q8H  . irbesartan  300 mg Oral Daily  . levETIRAcetam  500 mg Oral Daily  . pantoprazole  40 mg Oral Daily  . rosuvastatin  10 mg Oral Daily  . sodium chloride flush  3 mL Intravenous Q12H   Continuous Infusions:    LOS: 2 days      Time spent: 25 minutes   Dessa Phi, DO [facility] 12/20/2020, 11:27 AM   Available via Epic secure chat 7am-7pm After these hours, please refer to coverage provider listed on [email or web address]

## 2020-12-20 NOTE — Plan of Care (Signed)
  Problem: Education: Goal: Knowledge of disease and its progression will improve Outcome: Progressing   Problem: Fluid Volume: Goal: Compliance with measures to maintain balanced fluid volume will improve Outcome: Progressing   

## 2020-12-21 DIAGNOSIS — R569 Unspecified convulsions: Secondary | ICD-10-CM | POA: Diagnosis not present

## 2020-12-21 LAB — TYPE AND SCREEN
ABO/RH(D): O POS
Antibody Screen: NEGATIVE
Unit division: 0

## 2020-12-21 LAB — CBC
HCT: 24.7 % — ABNORMAL LOW (ref 36.0–46.0)
Hemoglobin: 8.4 g/dL — ABNORMAL LOW (ref 12.0–15.0)
MCH: 30.3 pg (ref 26.0–34.0)
MCHC: 34 g/dL (ref 30.0–36.0)
MCV: 89.2 fL (ref 80.0–100.0)
Platelets: 139 10*3/uL — ABNORMAL LOW (ref 150–400)
RBC: 2.77 MIL/uL — ABNORMAL LOW (ref 3.87–5.11)
RDW: 16 % — ABNORMAL HIGH (ref 11.5–15.5)
WBC: 32.1 10*3/uL — ABNORMAL HIGH (ref 4.0–10.5)
nRBC: 0 % (ref 0.0–0.2)

## 2020-12-21 LAB — BASIC METABOLIC PANEL
Anion gap: 15 (ref 5–15)
BUN: 32 mg/dL — ABNORMAL HIGH (ref 8–23)
CO2: 26 mmol/L (ref 22–32)
Calcium: 7.9 mg/dL — ABNORMAL LOW (ref 8.9–10.3)
Chloride: 94 mmol/L — ABNORMAL LOW (ref 98–111)
Creatinine, Ser: 3.13 mg/dL — ABNORMAL HIGH (ref 0.44–1.00)
GFR, Estimated: 15 mL/min — ABNORMAL LOW (ref >60)
Glucose, Bld: 226 mg/dL — ABNORMAL HIGH (ref 70–99)
Potassium: 3.1 mmol/L — ABNORMAL LOW (ref 3.5–5.1)
Sodium: 135 mmol/L (ref 135–145)

## 2020-12-21 LAB — BPAM RBC
Blood Product Expiration Date: 202202062359
ISSUE DATE / TIME: 202201071111
Unit Type and Rh: 5100

## 2020-12-21 MED ORDER — POTASSIUM CHLORIDE CRYS ER 20 MEQ PO TBCR
40.0000 meq | EXTENDED_RELEASE_TABLET | Freq: Once | ORAL | Status: AC
  Administered 2020-12-21: 40 meq via ORAL
  Filled 2020-12-21: qty 2

## 2020-12-21 NOTE — TOC Initial Note (Addendum)
Transition of Care [facility]) - Initial/Assessment Note    Patient Details  Name: Cassandra Dyer MRN: [id number] Date of Birth: 12-05-1941  Transition of Care [facility]) CM/SW Contact:    Coralee Pesa, [place] Phone Number: 12/21/2020, 4:01 PM  Clinical Narrative:                 CSW spoke with pt at bedside to discuss SNF recommendation. Pt is agreeable to SNF placement and requested [facility]. She has had her pfizer vaccines. She requested that CSW contact her sister to update her, a voicemail was left. CSW will complete workup and faxout for pt and update facility. SW will continue to follow for DC planning.  CSW spoke with pt's sister and she is agreeable to SNF and feels it is a good idea. She appreciated the update.  Expected Discharge Plan: Skilled Nursing Facility Barriers to Discharge: Insurance Authorization,Continued Medical Work up,SNF Pending bed offer   Patient Goals and CMS Choice Patient states their goals for this hospitalization and ongoing recovery are:: Pt is agreeable to go to SNF CMS Medicare.gov Compare Post Acute Care list provided to:: Patient Choice offered to / list presented to : Patient  Expected Discharge Plan and Services Expected Discharge Plan: [place] Choice: [place] Living arrangements for the past 2 months: Single Family Home Expected Discharge Date: 12/19/20               DME Arranged: N/A DME Agency: NA       HH Arranged: PT,OT [place] Agency: [place] [facility]) Date [place]: 12/19/20 Time [place]: 1062 Representative spoke with at [place]: [place] Arrangements/Services Living arrangements for the past 2 months: [place] Lives with:: Self Patient language and need for interpreter reviewed:: Yes Do you feel safe going back to the place where you live?: Yes      Need for Family Participation in Patient Care: Yes  (Comment) Care giver support system in place?: Yes (comment)   Criminal Activity/Legal Involvement Pertinent to Current Situation/Hospitalization: No - Comment as needed  Activities of Daily Living      Permission Sought/Granted Permission sought to share information with : Family Supports Permission granted to share information with : Yes, Verbal Permission Granted  Share Information with NAME: Cassandra Dyer     Permission granted to share info w Relationship: Sister  Permission granted to share info w Contact Information: [phone]  Emotional Assessment Appearance:: Appears stated age Attitude/Demeanor/Rapport: Engaged Affect (typically observed): Appropriate Orientation: : Oriented to Self,Oriented to Place,Oriented to  Time,Oriented to Situation Alcohol / Substance Use: Not Applicable Psych Involvement: No (comment)  Admission diagnosis:  Seizure ([place]) [R56.9] Seizures ([place]) [R56.9] Patient Active Problem List   Diagnosis Date Noted  . Pressure injury of skin 12/19/2020  . Seizure ([place]) 12/18/2020  . Seizures ([place]) 12/17/2020  . Hyponatremia 12/17/2020  . Brain metastases ([place]) 12/11/2020  . Anemia in chronic kidney disease 12/05/2020  . Encounter for antineoplastic immunotherapy 12/05/2020  . Closed displaced fracture of right femoral neck ([place]) 11/22/2020  . Noncompliance of patient with renal dialysis ([place]) 11/20/2020  . Adenocarcinoma of left lung, stage 4 ([place]) 11/16/2020  . Encounter for antineoplastic chemotherapy 11/16/2020  . Goals of care, counseling/discussion 11/16/2020  . Mass of upper lobe of left lung 11/05/2020  . Mediastinal adenopathy 11/05/2020  . Pulmonary nodules 11/05/2020  . Acute respiratory failure with hypoxia (  [place]) 10/23/2020  . Pulmonary edema 10/22/2020  . Acute pulmonary edema ([place]) 10/22/2020  . Educated about COVID-19 virus infection 10/10/2020  . Chronic hypoxemic respiratory failure ([place]) 09/26/2020  . COPD (chronic  obstructive pulmonary disease) ([place]) 09/26/2020  . Nausea vomiting and diarrhea 09/10/2020  . ESRD (end stage renal disease) ([place])   . Volume overload   . Cough 08/04/2020  . Thrombocytopenia ([place]) 08/02/2020  . Anemia 08/02/2020  . Hyperkalemia 07/27/2020  . Hyperlipidemia 07/27/2020  . Metabolic acidosis 27/78/2423  . CKD (chronic kidney disease) 05/23/2020  . Solitary kidney, acquired 05/23/2020  . Fever 04/26/2020  . Acute hypoxemic respiratory failure ([place]) 04/19/2020  . Diarrhea 04/19/2020  . Nausea & vomiting 04/19/2020  . Pneumonia 04/09/2020  . Acute kidney injury (AKI) with acute tubular necrosis (ATN) ([place]) 04/09/2020  . Hypertensive urgency 04/09/2020  . History of hepatitis C 04/09/2020  . Sepsis ([place]) 04/08/2020  . Venous insufficiency 12/28/2019  . Hyperglycemia 06/29/2019  . Osteoporosis 02/20/2018  . Essential hypertension 02/15/2017  . Dyslipidemia 02/15/2017  . Renal cell carcinoma of right kidney ([place]) 02/15/2017   PCP:  Biagio Borg, MD Pharmacy:   [place]), [place] - [address] [place] ([place]) [place] [address] Phone: [phone] Fax: [place], [place] [address] [place] [address] Phone: [phone] Fax: [phone]     Social Determinants of Health (SDOH) Interventions    Readmission Risk Interventions Readmission Risk Prevention Plan 08/08/2020  Transportation Screening Complete  Medication Review ([place]) Complete  PCP or Specialist appointment within 3-5 days of discharge Complete  HRI or [place] Complete  SW Recovery Care/Counseling Consult Complete  [place] Not Applicable  Some recent data might be hidden

## 2020-12-21 NOTE — Progress Notes (Signed)
PROGRESS NOTE    FREDDA CLARIDA  YOV:[id number] DOB: 26-Nov-1941 DOA: 12/17/2020 PCP: Biagio Borg, MD     Brief Narrative:  Cassandra Dyer is a 80 y.o. female with medical history significant of stage IV lung cancer with brain metastasis, anemia, ESRD on HD Monday Wednesday Friday, COPD, hyperlipidemia, hypertension, hepatitis C, renal cell carcinoma, and thrombocytopenia who presented following 2 to 3 days of left leg pain and right arm shaking. Patient reportedly has had to 3 days of left leg pain and right arm shaking.  She missed her dialysis session due to this. While in the ED she acutely became altered and had an apparent seizure.  Neurology was consulted; she got a Keppra load and she is now mentating appropriately.  Patient was supposed to discharge home on 1/6.  However, after evaluation with PT, it was determined that she would need SNF placement.  New events last 24 hours / Subjective: -Just finished breakfast, denies any complaints  Assessment & Plan:   Seizure -Patient with history of lung cancer with brain mets -XAJ:[id number] of epileptogenicity arising from left frontal region as well as cortical dysfunction in left frontal region likely secondary to underlying structural abnormalities. -MRIbrain:Probable mild disease progression as compared to previous MRI from 11/27/2019 as evidenced by mildly increased vasogenic edema about a few lesions situated at the left frontal lobe and left cerebellum. -Seen by neurology in consultation, started on Keppra this is to be taken immediately after dialysis on dialysis days-MWF -PT OT eval completed, SNF recommended for rehab -Social work consulted  ESRD, on HD MWF -Nephrology consulted  Stage IV lung cancer with brain mets -Continue Decadron 4 mg 3 times daily -Follows withDr. Julien Nordmann -On palliative chemotherapy and recently had SRS  Essential hypertension -Continue Coreg, Norvasc,  ARB  Hyperlipidemia -Continue Crestor  Leukocytosis -Secondary to advanced malignancy and high-dose Decadron --No signs or symptoms of acute infectious process, remains afebrile and nontoxic  Anemia of chronic disease Secondary to stage IV lung cancer and ESRD -No active bleeding, transfuse 1 unit of PRBC on 1/7 -Check CBC in a.m.  DVT prophylaxis:  heparin injection 5,000 Units Start: 12/18/20 1400  Code Status: DNR Family Communication: No family at bedside Disposition Plan:  Status is: Inpatient  Remains inpatient appropriate because:Unsafe d/c plan   Dispo: The patient is from: Home              Anticipated d/c is to: SNF              Anticipated d/c date is: 1 day              Patient currently is medically stable to d/c.     Consultants:   Neurology  Nephrology   Antimicrobials:  Anti-infectives (From admission, onward)   None       Objective: Vitals:   12/20/20 1759 12/20/20 2113 12/21/20 0620 12/21/20 0854  BP: (!) 159/61 (!) 147/59 (!) 184/59 (!) 186/66  Pulse: 80 81 74 72  Resp: 18 18 16 14   Temp: 98.3 F (36.8 C) 99.1 F (37.3 C) 99 F (37.2 C) 99.1 F (37.3 C)  TempSrc: Oral Oral Oral Oral  SpO2: 97% 94% 100% 98%  Weight:  52.9 kg      Intake/Output Summary (Last 24 hours) at 12/21/2020 1108 Last data filed at 12/21/2020 0900 Gross per 24 hour  Intake 1728 ml  Output 1500 ml  Net 228 ml   Filed Weights   12/20/20 0955 12/20/20 1325  12/20/20 2113  Weight: 53.3 kg 52 kg 52.9 kg    Examination: General exam: Pleasant elderly female laying in bed, awake alert oriented to self and place, mild cognitive deficits CVS: S1-S2, regular rate rhythm Lungs: Decreased breath sounds the bases otherwise clear Abdomen: Soft, nontender, bowel sounds present Extremities: No edema Neuro: Moves all extremities, no localizing signs Psych: Appropriate mood and affect, poor insight   Data Reviewed: I have personally reviewed following labs and  imaging studies  CBC: Recent Labs  Lab 12/17/20 1636 12/18/20 0539 12/19/20 0237 12/19/20 1019 12/20/20 0829 12/20/20 1901 12/21/20 0235  WBC 56.2* 41.4* 33.7*  --  35.7*  --  32.1*  HGB 10.0* 8.1* 6.6* 7.1* 6.8* 9.4* 8.4*  HCT 31.7* 24.2* 19.4* 21.9* 21.1* 27.6* 24.7*  MCV 90.8 85.8 87.0  --  89.4  --  89.2  PLT 197 171 150  --  156  --  440*   Basic Metabolic Panel: Recent Labs  Lab 12/17/20 1636 12/18/20 0539 12/19/20 0237 12/20/20 0829 12/21/20 0235  NA 129* 131* 136 131* 135  K 5.7* 5.1 3.7 4.2 3.1*  CL 88* 87* 95* 92* 94*  CO2 19* 24 27 21* 26  GLUCOSE 139* 147* 132* 189* 226*  BUN 123* 132* 54* 83* 32*  CREATININE 6.93* 7.35* 4.10* 5.50* 3.13*  CALCIUM 8.3* 8.2* 8.0* 8.2* 7.9*   GFR: Estimated Creatinine Clearance: 11.9 mL/min (A) (by C-G formula based on SCr of 3.13 mg/dL (H)). Liver Function Tests: Recent Labs  Lab 12/18/20 0539  AST 20  ALT 22  ALKPHOS 151*  BILITOT 1.4*  PROT 4.7*  ALBUMIN 2.0*   No results for input(s): LIPASE, AMYLASE in the last 168 hours. Recent Labs  Lab 12/18/20 0057  AMMONIA 23   Coagulation Profile: Recent Labs  Lab 12/18/20 0057  INR 1.2   Cardiac Enzymes: No results for input(s): CKTOTAL, CKMB, CKMBINDEX, TROPONINI in the last 168 hours. BNP (last 3 results) No results for input(s): PROBNP in the last 8760 hours. HbA1C: No results for input(s): HGBA1C in the last 72 hours. CBG: Recent Labs  Lab 12/17/20 1855  GLUCAP 158*   Lipid Profile: No results for input(s): CHOL, HDL, LDLCALC, TRIG, CHOLHDL, LDLDIRECT in the last 72 hours. Thyroid Function Tests: No results for input(s): TSH, T4TOTAL, FREET4, T3FREE, THYROIDAB in the last 72 hours. Anemia Panel: No results for input(s): VITAMINB12, FOLATE, FERRITIN, TIBC, IRON, RETICCTPCT in the last 72 hours. Sepsis Labs: No results for input(s): PROCALCITON, LATICACIDVEN in the last 168 hours.  Recent Results (from the past 240 hour(s))  SARS CORONAVIRUS 2  (TAT 6-24 HRS) Nasopharyngeal Nasopharyngeal Swab     Status: None   Collection Time: 12/17/20  7:53 PM   Specimen: Nasopharyngeal Swab  Result Value Ref Range Status   SARS Coronavirus 2 NEGATIVE NEGATIVE Final    Comment: (NOTE) SARS-CoV-2 target nucleic acids are NOT DETECTED.  The SARS-CoV-2 RNA is generally detectable in upper and lower respiratory specimens during the acute phase of infection. Negative results do not preclude SARS-CoV-2 infection, do not rule out co-infections with other pathogens, and should not be used as the sole basis for treatment or other patient management decisions. Negative results must be combined with clinical observations, patient history, and epidemiological information. The expected result is Negative.  Fact Sheet for Patients: SugarRoll.be  Fact Sheet for Healthcare Providers: https://www.woods-mathews.com/  This test is not yet approved or cleared by the [place] FDA and  has been authorized for detection and/or  diagnosis of SARS-CoV-2 by FDA under an Emergency Use Authorization (EUA). This EUA will remain  in effect (meaning this test can be used) for the duration of the COVID-19 declaration under Se ction 564(b)(1) of the Act, 21 U.S.C. section 360bbb-3(b)(1), unless the authorization is terminated or revoked sooner.  Performed at [facility], [place] [address] [place], [place] [address]       Radiology Studies: No results found.    Scheduled Meds: . (feeding supplement) PROSource Plus  30 mL Oral BID BM  . sodium chloride   Intravenous Once  . sodium chloride   Intravenous Once  . amLODipine  10 mg Oral Daily  . carvedilol  12.5 mg Oral BID WC  . Chlorhexidine Gluconate Cloth  6 each Topical Q0600  . dexamethasone  4 mg Oral Q8H  . docusate sodium  100 mg Oral BID  . heparin  5,000 Units Subcutaneous Q8H  . irbesartan  300 mg Oral Daily  . levETIRAcetam  500 mg Oral Daily   . pantoprazole  40 mg Oral Daily  . rosuvastatin  10 mg Oral Daily  . sodium chloride flush  3 mL Intravenous Q12H   Continuous Infusions:    LOS: 3 days      Time spent: 25 minutes   Domenic Polite, MD [facility] 12/21/2020, 11:08 AM

## 2020-12-21 NOTE — NC FL2 (Cosign Needed)
[place] LEVEL OF CARE SCREENING TOOL     IDENTIFICATION  Patient Name: Cassandra Dyer Birthdate: [birth year] Sex: female Admission Date (Current Location): 12/17/2020  [facility] and [place] Number:  Herbalist and Address:  [facility] [place], [place] [address] [place], [place] [address]      Provider Number: [id number]  Attending Physician Name and Address:  Domenic Polite, MD  Relative Name and Phone Number:  Hien Cunliffe    Current Level of Care:   Recommended Level of Care: [place] Prior Approval Number:    Date Approved/Denied:   PASRR Number: [id number]  Discharge Plan: SNF    Current Diagnoses: Patient Active Problem List   Diagnosis Date Noted  . Pressure injury of skin 12/19/2020  . Seizure ([place]) 12/18/2020  . Seizures ([place]) 12/17/2020  . Hyponatremia 12/17/2020  . Brain metastases ([place]) 12/11/2020  . Anemia in chronic kidney disease 12/05/2020  . Encounter for antineoplastic immunotherapy 12/05/2020  . Closed displaced fracture of right femoral neck ([place]) 11/22/2020  . Noncompliance of patient with renal dialysis ([place]) 11/20/2020  . Adenocarcinoma of left lung, stage 4 ([place]) 11/16/2020  . Encounter for antineoplastic chemotherapy 11/16/2020  . Goals of care, counseling/discussion 11/16/2020  . Mass of upper lobe of left lung 11/05/2020  . Mediastinal adenopathy 11/05/2020  . Pulmonary nodules 11/05/2020  . Acute respiratory failure with hypoxia ([place]) 10/23/2020  . Pulmonary edema 10/22/2020  . Acute pulmonary edema ([place]) 10/22/2020  . Educated about COVID-19 virus infection 10/10/2020  . Chronic hypoxemic respiratory failure ([place]) 09/26/2020  . COPD (chronic obstructive pulmonary disease) ([place]) 09/26/2020  . Nausea vomiting and diarrhea 09/10/2020  . ESRD (end stage renal disease) ([place])   . Volume overload   . Cough 08/04/2020  . Thrombocytopenia ([place]) 08/02/2020  . Anemia 08/02/2020  .  Hyperkalemia 07/27/2020  . Hyperlipidemia 07/27/2020  . Metabolic acidosis 93/23/5573  . CKD (chronic kidney disease) 05/23/2020  . Solitary kidney, acquired 05/23/2020  . Fever 04/26/2020  . Acute hypoxemic respiratory failure ([place]) 04/19/2020  . Diarrhea 04/19/2020  . Nausea & vomiting 04/19/2020  . Pneumonia 04/09/2020  . Acute kidney injury (AKI) with acute tubular necrosis (ATN) ([place]) 04/09/2020  . Hypertensive urgency 04/09/2020  . History of hepatitis C 04/09/2020  . Sepsis ([place]) 04/08/2020  . Venous insufficiency 12/28/2019  . Hyperglycemia 06/29/2019  . Osteoporosis 02/20/2018  . Essential hypertension 02/15/2017  . Dyslipidemia 02/15/2017  . Renal cell carcinoma of right kidney ([place]) 02/15/2017    Orientation RESPIRATION BLADDER Height & Weight     Self,Time,Situation,Place  O2 ([place] 2) Continent Weight: 116 lb 10 oz (52.9 kg) Height:     BEHAVIORAL SYMPTOMS/MOOD NEUROLOGICAL BOWEL NUTRITION STATUS      Incontinent Diet (See DC summary)  AMBULATORY STATUS COMMUNICATION OF NEEDS Skin   Limited Assist Verbally PU Stage and Appropriate Care (Stage 2, R, L, Medial Buttocks PI. Foam dressing)                       Personal Care Assistance Level of Assistance  Bathing,Feeding,Dressing Bathing Assistance: Limited assistance Feeding assistance: Independent Dressing Assistance: Limited assistance     Functional Limitations Info  Sight,Hearing,Speech Sight Info: Impaired Hearing Info: Adequate Speech Info: Adequate    SPECIAL CARE FACTORS FREQUENCY  PT (By licensed PT),OT (By licensed OT)     PT Frequency: 5 week OT Frequency: 5x week  Contractures Contractures Info: Not present    Additional Factors Info  Code Status,Allergies Code Status Info: DNR Allergies Info: NKA           Current Medications (12/21/2020):  This is the current hospital active medication list Current Facility-Administered Medications  Medication Dose Route  Frequency Provider Last Rate Last Admin  . (feeding supplement) PROSource Plus liquid 30 mL  30 mL Oral BID BM Loren Racer, PA-C   30 mL at 12/21/20 1405  . 0.9 %  sodium chloride infusion (Manually program via Guardrails IV Fluids)   Intravenous Once Zierle-Ghosh, Asia B, DO      . 0.9 %  sodium chloride infusion (Manually program via Guardrails IV Fluids)   Intravenous Once Dessa Phi, DO      . acetaminophen (TYLENOL) tablet 650 mg  650 mg Oral Q6H PRN Marcelyn Bruins, MD       Or  . acetaminophen (TYLENOL) suppository 650 mg  650 mg Rectal Q6H PRN Marcelyn Bruins, MD      . amLODipine (NORVASC) tablet 10 mg  10 mg Oral Daily Marcelyn Bruins, MD   10 mg at 12/21/20 0905  . carvedilol (COREG) tablet 12.5 mg  12.5 mg Oral BID WC Marcelyn Bruins, MD   12.5 mg at 12/21/20 0905  . Chlorhexidine Gluconate Cloth 2 % PADS 6 each  6 each Topical Q0600 Elmarie Shiley, MD   6 each at 12/21/20 0535  . dexamethasone (DECADRON) tablet 4 mg  4 mg Oral Q8H Marcelyn Bruins, MD   4 mg at 12/21/20 1405  . docusate sodium (COLACE) capsule 100 mg  100 mg Oral BID Marcelyn Bruins, MD   100 mg at 12/21/20 0905  . heparin injection 5,000 Units  5,000 Units Subcutaneous Q8H Marcelyn Bruins, MD   5,000 Units at 12/21/20 1405  . irbesartan (AVAPRO) tablet 300 mg  300 mg Oral Daily Marcelyn Bruins, MD   300 mg at 12/21/20 0905  . levETIRAcetam (KEPPRA XR) 24 hr tablet 500 mg  500 mg Oral Daily Dessa Phi, DO   500 mg at 12/21/20 0906  . ondansetron (ZOFRAN) tablet 4 mg  4 mg Oral Q8H PRN Marcelyn Bruins, MD      . pantoprazole (PROTONIX) EC tablet 40 mg  40 mg Oral Daily Marcelyn Bruins, MD   40 mg at 12/21/20 0906  . polyethylene glycol (MIRALAX / GLYCOLAX) packet 17 g  17 g Oral Daily PRN Marcelyn Bruins, MD      . prochlorperazine (COMPAZINE) tablet 10 mg  10 mg Oral Q6H PRN Marcelyn Bruins, MD      . rosuvastatin (CRESTOR) tablet 10 mg  10 mg Oral Daily  Marcelyn Bruins, MD   10 mg at 12/21/20 0906  . sodium chloride flush (NS) 0.9 % injection 10-40 mL  10-40 mL Intracatheter PRN Marcelyn Bruins, MD      . sodium chloride flush (NS) 0.9 % injection 3 mL  3 mL Intravenous Q12H Marcelyn Bruins, MD   3 mL at 12/18/20 0055     Discharge Medications: Please see discharge summary for a list of discharge medications.  Relevant Imaging Results:  Relevant Lab Results:   Additional Information SS# [place], [place]

## 2020-12-21 NOTE — Progress Notes (Signed)
[facility] Progress Note   Subjective:  Seen in room. No complaints this am.  Completed dialysis with 1.5L UF. Transfused 1u prbcs on dialysis. Denies cp, sob, n/v, melena.   Objective Vitals:   12/20/20 1759 12/20/20 2113 12/21/20 0620 12/21/20 0854  BP: (!) 159/61 (!) 147/59 (!) 184/59 (!) 186/66  Pulse: 80 81 74 72  Resp: 18 18 16 14   Temp: 98.3 F (36.8 C) 99.1 F (37.3 C) 99 F (37.2 C) 99.1 F (37.3 C)  TempSrc: Oral Oral Oral Oral  SpO2: 97% 94% 100% 98%  Weight:  52.9 kg     Physical Exam General: Elderly woman, NAD. On nasal O2 (uses at home). Heart: RRR; no murmur Lungs: CTA anteriorly, dull lateral bases Abdomen: soft, non-tender Extremities: No LE edema Dialysis Access: TDC in R chest, LUE AVF + thrill with significant bruising (s/p infiltration 1st use)  Additional Objective Labs: Basic Metabolic Panel: Recent Labs  Lab 12/19/20 0237 12/20/20 0829 12/21/20 0235  NA 136 131* 135  K 3.7 4.2 3.1*  CL 95* 92* 94*  CO2 27 21* 26  GLUCOSE 132* 189* 226*  BUN 54* 83* 32*  CREATININE 4.10* 5.50* 3.13*  CALCIUM 8.0* 8.2* 7.9*   Liver Function Tests: Recent Labs  Lab 12/18/20 0539  AST 20  ALT 22  ALKPHOS 151*  BILITOT 1.4*  PROT 4.7*  ALBUMIN 2.0*   CBC: Recent Labs  Lab 12/17/20 1636 12/18/20 0539 12/19/20 0237 12/19/20 1019 12/20/20 0829 12/20/20 1901 12/21/20 0235  WBC 56.2* 41.4* 33.7*  --  35.7*  --  32.1*  HGB 10.0* 8.1* 6.6*   < > 6.8* 9.4* 8.4*  HCT 31.7* 24.2* 19.4*   < > 21.1* 27.6* 24.7*  MCV 90.8 85.8 87.0  --  89.4  --  89.2  PLT 197 171 150  --  156  --  139*   < > = values in this interval not displayed.   Studies/Results: No results found. Medications:  . (feeding supplement) PROSource Plus  30 mL Oral BID BM  . sodium chloride   Intravenous Once  . sodium chloride   Intravenous Once  . amLODipine  10 mg Oral Daily  . carvedilol  12.5 mg Oral BID WC  . Chlorhexidine Gluconate Cloth  6 each Topical  Q0600  . dexamethasone  4 mg Oral Q8H  . docusate sodium  100 mg Oral BID  . heparin  5,000 Units Subcutaneous Q8H  . irbesartan  300 mg Oral Daily  . levETIRAcetam  500 mg Oral Daily  . pantoprazole  40 mg Oral Daily  . rosuvastatin  10 mg Oral Daily  . sodium chloride flush  3 mL Intravenous Q12H    Dialysis Orders: MWF @ [facility] 4hr, 350/800, EDW 48.5kg, 2K/2Ca, TDC + LUE AVF, heparin 2000 - Venofer 50mg  IV q week - No ESA d/t cancer  Assessment/Plan: 1.  Seizure: Started on PO Keppra - per neuro. 2.  Metastatic lung cancer to brain: On dexamethazone TID, starting palliative chemo soon. 3.  ESRD: Continue HD per MWF schedule. Next HD 1/10. K 3.1- KCl 40 ordered.  4.  Hypertension/volume: BP better. B effusions in lungs but unclear if we can touch this with dialysis, likely malignant effusions - UF as tolerated. 5.  Anemia: Hgb 10 -> 8.1 -> 7.1  ?GI loss.  8.4 today  s/p 1 u prbc 1/7.  Typically ESAs are held in setting of cancer. In her case however, may need to weigh  against risks of repeated transfusions, esp when cure is not expected. 6.  Metabolic bone disease: CorrCa slightly high, no VDRA - follow. 7.  Nutrition: Alb low, continue protein supplements. 8.  DNR 9. Dispo: SNF placement pending    [place] Larina Earthly PA-C [facility] 12/21/2020,9:58 AM

## 2020-12-22 DIAGNOSIS — R569 Unspecified convulsions: Secondary | ICD-10-CM | POA: Diagnosis not present

## 2020-12-22 MED ORDER — POTASSIUM CHLORIDE CRYS ER 20 MEQ PO TBCR
40.0000 meq | EXTENDED_RELEASE_TABLET | Freq: Once | ORAL | Status: AC
  Administered 2020-12-22: 40 meq via ORAL
  Filled 2020-12-22: qty 2

## 2020-12-22 MED ORDER — PROCHLORPERAZINE MALEATE 10 MG PO TABS
10.0000 mg | ORAL_TABLET | Freq: Four times a day (QID) | ORAL | Status: DC | PRN
  Filled 2020-12-22: qty 1

## 2020-12-22 NOTE — Progress Notes (Signed)
PROGRESS NOTE    Cassandra Dyer  QQV:[id number] DOB: [birth year] DOA: 12/17/2020 PCP: Biagio Borg, MD     Brief Narrative:  Cassandra Dyer is a 80 y.o. female with medical history significant of stage IV lung cancer with brain metastasis, anemia, ESRD on HD Monday Wednesday Friday, COPD, hyperlipidemia, hypertension, hepatitis C, renal cell carcinoma, and thrombocytopenia who presented following 2 to 3 days of left leg pain and right arm shaking. Patient reportedly has had to 3 days of left leg pain and right arm shaking.  She missed her dialysis session due to this. While in the ED she acutely became altered and had an apparent seizure.  Neurology was consulted; she got a Keppra load and she is now mentating appropriately.  Patient was supposed to discharge home on 1/6.  However, after evaluation with PT, it was determined that she would need SNF placement.   Subjective: -Feels okay, denies any complaints, awaiting rehab  Assessment & Plan:   Seizure -history of lung cancer with brain mets -PPI:[id number] of epileptogenicity arising from left frontal region as well as cortical dysfunction in left frontal region likely secondary to underlying structural abnormalities. -MRIbrain:Probable mild disease progression as compared to previous MRI from 11/27/2019 as evidenced by mildly increased vasogenic edema about a few lesions situated at the left frontal lobe and left cerebellum. -Seen by neurology in consultation, started on Keppra this is to be taken immediately after dialysis on dialysis days-MWF -PT OT eval completed, SNF recommended for rehab -Social work consulted, discharge planning  ESRD, on HD MWF -Nephrology consulted  Stage IV lung cancer with brain mets -Continue Decadron 4 mg 3 times daily -Follows withDr. Julien Nordmann -On palliative chemotherapy and recently completed SRS  Essential hypertension -Continue Coreg, Norvasc, ARB  Hyperlipidemia -Continue  Crestor  Leukocytosis -Secondary to advanced malignancy and high-dose Decadron --No signs or symptoms of acute infectious process, remains afebrile and nontoxic  Anemia of chronic disease Secondary to stage IV lung cancer and ESRD -No active bleeding, transfused 1 unit of PRBC on 1/7 -Hemoglobin stable, monitor  DVT prophylaxis:  heparin injection 5,000 Units Start: 12/18/20 1400  Code Status: DNR Family Communication: No family at bedside Disposition Plan:  Status is: Inpatient  Remains inpatient appropriate because:Unsafe d/c plan   Dispo: The patient is from: Home              Anticipated d/c is to: SNF              Anticipated d/c date is: Unknown, when bed available              Patient currently is medically stable to d/c.     Consultants:   Neurology  Nephrology   Antimicrobials:  Anti-infectives (From admission, onward)   None       Objective: Vitals:   12/21/20 1821 12/21/20 2009 12/22/20 0630 12/22/20 1023  BP: (!) 145/68 (!) 153/62 (!) 176/64 (!) 172/60  Pulse: 76 74 72 70  Resp: 20 18 18 18   Temp: 99.2 F (37.3 C) 98.9 F (37.2 C) 98.9 F (37.2 C) 99 F (37.2 C)  TempSrc: Oral Oral Oral Oral  SpO2: 96% 98% 96% 98%  Weight:        Intake/Output Summary (Last 24 hours) at 12/22/2020 1120 Last data filed at 12/22/2020 0900 Gross per 24 hour  Intake 720 ml  Output 0 ml  Net 720 ml   Filed Weights   12/20/20 0955 12/20/20 1325 12/20/20 2113  Weight:  53.3 kg 52 kg 52.9 kg    Examination: General exam: Pleasant elderly female sitting up in bed, awake alert oriented to self and place, mild cognitive deficits CVS: S1-S2, regular rate rhythm Lungs: Decreased breath sounds the bases, otherwise clear Abdomen: Soft, nontender, bowel sounds present Extremities: No edema  Neuro: Moves all extremities, no localizing signs Psych: Appropriate mood and affect, poor insight   Data Reviewed: I have personally reviewed following labs and imaging  studies  CBC: Recent Labs  Lab 12/17/20 1636 12/18/20 0539 12/19/20 0237 12/19/20 1019 12/20/20 0829 12/20/20 1901 12/21/20 0235  WBC 56.2* 41.4* 33.7*  --  35.7*  --  32.1*  HGB 10.0* 8.1* 6.6* 7.1* 6.8* 9.4* 8.4*  HCT 31.7* 24.2* 19.4* 21.9* 21.1* 27.6* 24.7*  MCV 90.8 85.8 87.0  --  89.4  --  89.2  PLT 197 171 150  --  156  --  376*   Basic Metabolic Panel: Recent Labs  Lab 12/17/20 1636 12/18/20 0539 12/19/20 0237 12/20/20 0829 12/21/20 0235  NA 129* 131* 136 131* 135  K 5.7* 5.1 3.7 4.2 3.1*  CL 88* 87* 95* 92* 94*  CO2 19* 24 27 21* 26  GLUCOSE 139* 147* 132* 189* 226*  BUN 123* 132* 54* 83* 32*  CREATININE 6.93* 7.35* 4.10* 5.50* 3.13*  CALCIUM 8.3* 8.2* 8.0* 8.2* 7.9*   GFR: Estimated Creatinine Clearance: 11.9 mL/min (A) (by C-G formula based on SCr of 3.13 mg/dL (H)). Liver Function Tests: Recent Labs  Lab 12/18/20 0539  AST 20  ALT 22  ALKPHOS 151*  BILITOT 1.4*  PROT 4.7*  ALBUMIN 2.0*   No results for input(s): LIPASE, AMYLASE in the last 168 hours. Recent Labs  Lab 12/18/20 0057  AMMONIA 23   Coagulation Profile: Recent Labs  Lab 12/18/20 0057  INR 1.2   Cardiac Enzymes: No results for input(s): CKTOTAL, CKMB, CKMBINDEX, TROPONINI in the last 168 hours. BNP (last 3 results) No results for input(s): PROBNP in the last 8760 hours. HbA1C: No results for input(s): HGBA1C in the last 72 hours. CBG: Recent Labs  Lab 12/17/20 1855  GLUCAP 158*   Lipid Profile: No results for input(s): CHOL, HDL, LDLCALC, TRIG, CHOLHDL, LDLDIRECT in the last 72 hours. Thyroid Function Tests: No results for input(s): TSH, T4TOTAL, FREET4, T3FREE, THYROIDAB in the last 72 hours. Anemia Panel: No results for input(s): VITAMINB12, FOLATE, FERRITIN, TIBC, IRON, RETICCTPCT in the last 72 hours. Sepsis Labs: No results for input(s): PROCALCITON, LATICACIDVEN in the last 168 hours.  Recent Results (from the past 240 hour(s))  SARS CORONAVIRUS 2 (TAT 6-24  HRS) Nasopharyngeal Nasopharyngeal Swab     Status: None   Collection Time: 12/17/20  7:53 PM   Specimen: Nasopharyngeal Swab  Result Value Ref Range Status   SARS Coronavirus 2 NEGATIVE NEGATIVE Final    Comment: (NOTE) SARS-CoV-2 target nucleic acids are NOT DETECTED.  The SARS-CoV-2 RNA is generally detectable in upper and lower respiratory specimens during the acute phase of infection. Negative results do not preclude SARS-CoV-2 infection, do not rule out co-infections with other pathogens, and should not be used as the sole basis for treatment or other patient management decisions. Negative results must be combined with clinical observations, patient history, and epidemiological information. The expected result is Negative.  Fact Sheet for Patients: SugarRoll.be  Fact Sheet for Healthcare Providers: https://www.woods-mathews.com/  This test is not yet approved or cleared by the [place] FDA and  has been authorized for detection and/or diagnosis of  SARS-CoV-2 by FDA under an Emergency Use Authorization (EUA). This EUA will remain  in effect (meaning this test can be used) for the duration of the COVID-19 declaration under Se ction 564(b)(1) of the Act, 21 U.S.C. section 360bbb-3(b)(1), unless the authorization is terminated or revoked sooner.  Performed at [facility], [place] [address], [place], [place] [address]       Radiology Studies: No results found.    Scheduled Meds: . (feeding supplement) PROSource Plus  30 mL Oral BID BM  . sodium chloride   Intravenous Once  . sodium chloride   Intravenous Once  . amLODipine  10 mg Oral Daily  . carvedilol  12.5 mg Oral BID WC  . Chlorhexidine Gluconate Cloth  6 each Topical Q0600  . dexamethasone  4 mg Oral Q8H  . docusate sodium  100 mg Oral BID  . heparin  5,000 Units Subcutaneous Q8H  . irbesartan  300 mg Oral Daily  . levETIRAcetam  500 mg Oral Daily  .  pantoprazole  40 mg Oral Daily  . rosuvastatin  10 mg Oral Daily  . sodium chloride flush  3 mL Intravenous Q12H   Continuous Infusions:    LOS: 4 days   Time spent: 25 minutes   Domenic Polite, MD [facility] 12/22/2020, 11:20 AM

## 2020-12-22 NOTE — Progress Notes (Signed)
[facility] Progress Note   Subjective:  Seen in room. No overnight issues.  No complaints this am.    Objective Vitals:   12/21/20 0854 12/21/20 1821 12/21/20 2009 12/22/20 0630  BP: (!) 186/66 (!) 145/68 (!) 153/62 (!) 176/64  Pulse: 72 76 74 72  Resp: 14 20 18 18   Temp: 99.1 F (37.3 C) 99.2 F (37.3 C) 98.9 F (37.2 C) 98.9 F (37.2 C)  TempSrc: Oral Oral Oral Oral  SpO2: 98% 96% 98% 96%  Weight:       Physical Exam General: Elderly woman, NAD. On nasal O2 (uses at home). Heart: RRR; no murmur Lungs: CTA anteriorly, dull lateral bases Abdomen: soft, non-tender Extremities: No LE edema Dialysis Access: TDC in R chest, LUE AVF + thrill with significant bruising (s/p infiltration 1st use)  Additional Objective Labs: Basic Metabolic Panel: Recent Labs  Lab 12/19/20 0237 12/20/20 0829 12/21/20 0235  NA 136 131* 135  K 3.7 4.2 3.1*  CL 95* 92* 94*  CO2 27 21* 26  GLUCOSE 132* 189* 226*  BUN 54* 83* 32*  CREATININE 4.10* 5.50* 3.13*  CALCIUM 8.0* 8.2* 7.9*   Liver Function Tests: Recent Labs  Lab 12/18/20 0539  AST 20  ALT 22  ALKPHOS 151*  BILITOT 1.4*  PROT 4.7*  ALBUMIN 2.0*   CBC: Recent Labs  Lab 12/17/20 1636 12/18/20 0539 12/19/20 0237 12/19/20 1019 12/20/20 0829 12/20/20 1901 12/21/20 0235  WBC 56.2* 41.4* 33.7*  --  35.7*  --  32.1*  HGB 10.0* 8.1* 6.6*   < > 6.8* 9.4* 8.4*  HCT 31.7* 24.2* 19.4*   < > 21.1* 27.6* 24.7*  MCV 90.8 85.8 87.0  --  89.4  --  89.2  PLT 197 171 150  --  156  --  139*   < > = values in this interval not displayed.   Studies/Results: No results found. Medications:  . (feeding supplement) PROSource Plus  30 mL Oral BID BM  . sodium chloride   Intravenous Once  . sodium chloride   Intravenous Once  . amLODipine  10 mg Oral Daily  . carvedilol  12.5 mg Oral BID WC  . Chlorhexidine Gluconate Cloth  6 each Topical Q0600  . dexamethasone  4 mg Oral Q8H  . docusate sodium  100 mg Oral BID   . heparin  5,000 Units Subcutaneous Q8H  . irbesartan  300 mg Oral Daily  . levETIRAcetam  500 mg Oral Daily  . pantoprazole  40 mg Oral Daily  . potassium chloride  40 mEq Oral Once  . rosuvastatin  10 mg Oral Daily  . sodium chloride flush  3 mL Intravenous Q12H    Dialysis Orders: MWF @ [facility] 4hr, 350/800, EDW 48.5kg, 2K/2Ca, TDC + LUE AVF, heparin 2000 - Venofer 50mg  IV q week - No ESA d/t cancer  Assessment/Plan: 1.  Seizure: Started on PO Keppra - per neuro. 2. Metastatic lung cancer to brain: On dexamethazone TID, starting palliative chemo soon. 3. ESRD: Continue HD per MWF schedule. Next HD 1/10. K 3.1- Repleting. Recheck in the am.  4. Hypertension/volume: BP high.  Cont home meds. B effusions in lungs but unclear if we can touch this with dialysis, likely malignant effusions - UF as tolerated. 5. Anemia: Hgb 10 -> 8.1 -> 7.1  ?GI loss. Hgb improved to 8.4   s/p 1 u prbc 1/7.  Typically ESAs are held in setting of cancer. In her case however, may need to  weigh against risks of repeated transfusions, esp when cure is not expected. Follow trend.  6. Metabolic bone disease: CorrCa slightly high, no VDRA - follow. 7. Nutrition: Alb low, continue protein supplements. 8. DNR 9. Dispo: SNF placement pending    Cassandra Dyer Larina Earthly PA-C [facility] 12/22/2020,8:57 AM

## 2020-12-23 DIAGNOSIS — R569 Unspecified convulsions: Secondary | ICD-10-CM | POA: Diagnosis not present

## 2020-12-23 LAB — CBC
HCT: 26.8 % — ABNORMAL LOW (ref 36.0–46.0)
Hemoglobin: 8.5 g/dL — ABNORMAL LOW (ref 12.0–15.0)
MCH: 29.3 pg (ref 26.0–34.0)
MCHC: 31.7 g/dL (ref 30.0–36.0)
MCV: 92.4 fL (ref 80.0–100.0)
Platelets: 185 10*3/uL (ref 150–400)
RBC: 2.9 MIL/uL — ABNORMAL LOW (ref 3.87–5.11)
RDW: 16.1 % — ABNORMAL HIGH (ref 11.5–15.5)
WBC: 34.8 10*3/uL — ABNORMAL HIGH (ref 4.0–10.5)
nRBC: 0 % (ref 0.0–0.2)

## 2020-12-23 LAB — RENAL FUNCTION PANEL
Albumin: 1.9 g/dL — ABNORMAL LOW (ref 3.5–5.0)
Anion gap: 14 (ref 5–15)
BUN: 92 mg/dL — ABNORMAL HIGH (ref 8–23)
CO2: 22 mmol/L (ref 22–32)
Calcium: 8.3 mg/dL — ABNORMAL LOW (ref 8.9–10.3)
Chloride: 93 mmol/L — ABNORMAL LOW (ref 98–111)
Creatinine, Ser: 6.03 mg/dL — ABNORMAL HIGH (ref 0.44–1.00)
GFR, Estimated: 7 mL/min — ABNORMAL LOW (ref >60)
Glucose, Bld: 171 mg/dL — ABNORMAL HIGH (ref 70–99)
Phosphorus: 4.1 mg/dL (ref 2.5–4.6)
Potassium: 5.7 mmol/L — ABNORMAL HIGH (ref 3.5–5.1)
Sodium: 129 mmol/L — ABNORMAL LOW (ref 135–145)

## 2020-12-23 LAB — SARS CORONAVIRUS 2 (TAT 6-24 HRS): SARS Coronavirus 2: NEGATIVE

## 2020-12-23 MED ORDER — PENTAFLUOROPROP-TETRAFLUOROETH EX AERO: 1.0000 "application " | INHALATION_SPRAY | CUTANEOUS | Status: DC | PRN

## 2020-12-23 MED ORDER — LIDOCAINE HCL (PF) 1 % IJ SOLN: 5.0000 mL | INTRAMUSCULAR | Status: DC | PRN

## 2020-12-23 MED ORDER — ALTEPLASE 2 MG IJ SOLR: 2.0000 mg | Freq: Once | INTRAMUSCULAR | Status: DC | PRN

## 2020-12-23 MED ORDER — ALTEPLASE 2 MG IJ SOLR
INTRAMUSCULAR | Status: AC
  Administered 2020-12-23: 4 mg
  Filled 2020-12-23: qty 4

## 2020-12-23 MED ORDER — HEPARIN SODIUM (PORCINE) 1000 UNIT/ML DIALYSIS: 1000.0000 [IU] | INTRAMUSCULAR | Status: DC | PRN

## 2020-12-23 MED ORDER — SODIUM CHLORIDE 0.9 % IV SOLN: 100.0000 mL | INTRAVENOUS | Status: DC | PRN

## 2020-12-23 MED ORDER — LIDOCAINE-PRILOCAINE 2.5-2.5 % EX CREA: 1.0000 "application " | TOPICAL_CREAM | CUTANEOUS | Status: DC | PRN

## 2020-12-23 MED ORDER — ALTEPLASE 2 MG IJ SOLR: 4.0000 mg | Freq: Once | INTRAMUSCULAR | Status: AC

## 2020-12-23 NOTE — Progress Notes (Signed)
Calling to give report [facility] was transferred and no one answered  Called back  And was told to leave name and phone number and someone would call back.

## 2020-12-23 NOTE — Progress Notes (Signed)
Physical Therapy Treatment Patient Details Name: Cassandra Dyer MRN: [id number] DOB: [birth year] Today's Date: 12/23/2020    History of Present Illness Pt is a 80 y/o female admitted secondary to LLE pain and R arm shaking. Concern for seizure activity. Pt currently getting radiation for brain mets. PMH includes HTN, ESRD on HD MWF, renal cell carcinoma, and lung cancer.    PT Comments    Patient received in bed, very pleasant and cooperative today and willing to attempt therapy. Able to get to EOB with Min-ModA, but very pain limited more in left hip than the right this morning. Had planned to attempt getting up in the stedy but transport staff arrived to take her to HD at this point. Positioned back in bed with all needs met and transport staff present and attending. Will continue to follow acutely.     Follow Up Recommendations  SNF;Supervision/Assistance - 24 hour     Equipment Recommendations  Wheelchair (measurements PT);Wheelchair cushion (measurements PT);Hospital bed;Other (comment) (hoyer lift with pad)    Recommendations for Other Services       Precautions / Restrictions Precautions Precautions: Fall Precaution Comments: watch O2 sats Restrictions Weight Bearing Restrictions: No RLE Weight Bearing: Weight bearing as tolerated Other Position/Activity Restrictions: direct anterior hip    Mobility  Bed Mobility Overal bed mobility: Needs Assistance Bed Mobility: Supine to Sit;Sit to Supine     Supine to sit: Min assist Sit to supine: Max assist   General bed mobility comments: MinA to bring BLEs around to EOB, able to manage her trunk today but had a lot of pain in her left hip once sitting EOB, needed ModA to scoot hips around to square up at EOB then MaxA to return back to bed due to pain  Transfers                 General transfer comment: deferred- pain and HD transport arrived  Ambulation/Gait             General Gait Details: deferred- pain  and HD transport staff had arrived   Stairs             Wheelchair Mobility    Modified Rankin (Stroke Patients Only)       Balance Overall balance assessment: Needs assistance Sitting-balance support: No upper extremity supported;Feet supported Sitting balance-Leahy Scale: Fair                                      Cognition Arousal/Alertness: Awake/alert Behavior During Therapy: WFL for tasks assessed/performed Overall Cognitive Status: Within Functional Limits for tasks assessed                                 General Comments: tearful throughout and focusing on her L hip pain      Exercises      General Comments        Pertinent Vitals/Pain Pain Assessment: Faces Faces Pain Scale: Hurts even more Pain Location: LLE Pain Descriptors / Indicators: Aching;Grimacing;Guarding;Moaning Pain Intervention(s): Limited activity within patient's tolerance;Monitored during session;Repositioned    Home Living                      Prior Function            PT Goals (current goals can now be found in the  care plan section) Acute Rehab PT Goals Patient Stated Goal: regain independence PT Goal Formulation: With patient Time For Goal Achievement: 01/02/21 Potential to Achieve Goals: Good Progress towards PT goals: Progressing toward goals    Frequency    Min 2X/week      PT Plan Current plan remains appropriate    Co-evaluation              AM-PAC PT "6 Clicks" Mobility   Outcome Measure  Help needed turning from your back to your side while in a flat bed without using bedrails?: A Little Help needed moving from lying on your back to sitting on the side of a flat bed without using bedrails?: A Little Help needed moving to and from a bed to a chair (including a wheelchair)?: Total Help needed standing up from a chair using your arms (e.g., wheelchair or bedside chair)?: Total Help needed to walk in hospital  room?: Total Help needed climbing 3-5 steps with a railing? : Total 6 Click Score: 10    End of Session Equipment Utilized During Treatment: Oxygen Activity Tolerance: Patient limited by pain Patient left: in bed;with call bell/phone within reach (HD transport staff present and attending) Nurse Communication: Mobility status PT Visit Diagnosis: Unsteadiness on feet (R26.81);Muscle weakness (generalized) (M62.81);Difficulty in walking, not elsewhere classified (R26.2) Pain - Right/Left: Right Pain - part of body: Hip     Time: 7915-0569 PT Time Calculation (min) (ACUTE ONLY): 13 min  Charges:  $Therapeutic Activity: 8-22 mins                     Windell Norfolk, DPT, PN1   Supplemental Physical Therapist [place]    Pager [phone] Acute Rehab Office [phone]

## 2020-12-23 NOTE — TOC Transition Note (Signed)
Transition of Care [facility]) - CM/SW Discharge Note *Discharge to [facility]  *Room 122   Patient Details  Name: Cassandra Dyer MRN: [id number] Date of Birth: 1941/07/16  Transition of Care [facility]) CM/SW Contact:  Sable Feil, LCSW Phone Number: 12/23/2020, 4:48 PM   Clinical Narrative: Patient medically stable for discharge and going to [facility] for [place] rehab. Discharge clinicals transmitted to facility, nurse provided with information to call report, and patient's sister contacted and informed regarding discharge and ambulance transport.    Final next level of care: Skilled Nursing Facility Barriers to Discharge: Barriers Resolved   Patient Goals and CMS Choice Patient states their goals for this hospitalization and ongoing recovery are:: Patient agreeable to SNF CMS Medicare.gov Compare Post Acute Care list provided to:: Patient Choice offered to / list presented to : Patient  Discharge Placement   Existing PASRR number confirmed : 12/21/20          Patient chooses bed at: [facility] Patient to be transferred to facility by: Non-emergency ambulance transport Name of family member notified: Sister - [phone] Patient and family notified of of transfer: 12/23/20  Discharge Plan and Services     Post Acute Care Choice: [place]          DME Arranged: N/A DME Agency: NA       HH Arranged: PT,OT [place] Agency: [place] ([employer]) Date HH Agency Contacted: 12/19/20 Time [place]: [id number] Representative spoke with at [place]: [place] Determinants of Health ([place]) Interventions  No SDOH interventions requested or needed at discharge   Readmission Risk Interventions Readmission Risk Prevention Plan 08/08/2020  Transportation Screening Complete  Medication Review Press photographer) Complete  PCP or Specialist appointment within 3-5 days of discharge Complete  HRI or [place] Complete  SW Recovery Care/Counseling Consult Complete  [place] Not Applicable  Some recent data might be hidden

## 2020-12-23 NOTE — Progress Notes (Signed)
Pt. HD catheter functioning poorly unable to obtain ordered BFR currently at BFR 175. Jen Mow, PA aware orders to d/c tx and cathflo catheter.

## 2020-12-23 NOTE — Plan of Care (Signed)
  Problem: Nutrition: Goal: Adequate nutrition will be maintained Outcome: Adequate for Discharge   Problem: Elimination: Goal: Will not experience complications related to bowel motility Outcome: Adequate for Discharge   

## 2020-12-23 NOTE — Progress Notes (Signed)
[facility] Progress Note   Subjective:   Patient seen and examined at bedside.  Reports she is feeling "pretty good."  No new complaints.  Denies CP, SOB, and n/v/d.   Objective Vitals:   12/23/20 1222 12/23/20 1237 12/23/20 1252 12/23/20 1307  BP: (!) 159/61 (!) 144/61 (!) 143/74 (!) 123/56  Pulse:      Resp: 13 15 15 16   Temp:      TempSrc:      SpO2:      Weight:       Physical Exam General:chronically ill appearing female in NAD Heart:RRR Lungs:nml WOB on 2L via [place], breath sounds diminished Abdomen:soft, NTND Extremities:no LE edema Dialysis Access: R TDC, LUE AVF +t/b  Filed Weights   12/20/20 1325 12/20/20 2113 12/23/20 1215  Weight: 52 kg 52.9 kg 54 kg    Intake/Output Summary (Last 24 hours) at 12/23/2020 1425 Last data filed at 12/23/2020 0900 Gross per 24 hour  Intake 600 ml  Output 0 ml  Net 600 ml    Additional Objective Labs: Basic Metabolic Panel: Recent Labs  Lab 12/20/20 0829 12/21/20 0235 12/23/20 1304  NA 131* 135 129*  K 4.2 3.1* 5.7*  CL 92* 94* 93*  CO2 21* 26 22  GLUCOSE 189* 226* 171*  BUN 83* 32* 92*  CREATININE 5.50* 3.13* 6.03*  CALCIUM 8.2* 7.9* 8.3*  PHOS  --   --  4.1   Liver Function Tests: Recent Labs  Lab 12/18/20 0539 12/23/20 1304  AST 20  --   ALT 22  --   ALKPHOS 151*  --   BILITOT 1.4*  --   PROT 4.7*  --   ALBUMIN 2.0* 1.9*   CBC: Recent Labs  Lab 12/18/20 0539 12/19/20 0237 12/19/20 1019 12/20/20 0829 12/20/20 1901 12/21/20 0235 12/23/20 1304  WBC 41.4* 33.7*  --  35.7*  --  32.1* 34.8*  HGB 8.1* 6.6*   < > 6.8* 9.4* 8.4* 8.5*  HCT 24.2* 19.4*   < > 21.1* 27.6* 24.7* 26.8*  MCV 85.8 87.0  --  89.4  --  89.2 92.4  PLT 171 150  --  156  --  139* 185   < > = values in this interval not displayed.   CBG: Recent Labs  Lab 12/17/20 1855  GLUCAP 158*   Medications: . [START ON 12/24/2020] sodium chloride    . [START ON 12/24/2020] sodium chloride     . (feeding supplement)  PROSource Plus  30 mL Oral BID BM  . sodium chloride   Intravenous Once  . sodium chloride   Intravenous Once  . amLODipine  10 mg Oral Daily  . carvedilol  12.5 mg Oral BID WC  . Chlorhexidine Gluconate Cloth  6 each Topical Q0600  . dexamethasone  4 mg Oral Q8H  . docusate sodium  100 mg Oral BID  . heparin  5,000 Units Subcutaneous Q8H  . irbesartan  300 mg Oral Daily  . levETIRAcetam  500 mg Oral Daily  . pantoprazole  40 mg Oral Daily  . rosuvastatin  10 mg Oral Daily  . sodium chloride flush  3 mL Intravenous Q12H    Dialysis Orders: MWF @ [facility] 4hr, 350/800, EDW 48.5kg, 2K/2Ca, TDC + LUE AVF, heparin 2000 - Venofer 50mg  IV q week - No ESA d/t cancer  Assessment/Plan: 1. Seizure: Started on PO Keppra - per neuro. 2. Metastatic lung cancer to brain: On dexamethazone TID, starting palliative chemo soon. 3. ESRD:On HD  MWF.  HD today per regular schedule. K 5.7 this AM.   4. Hypertension/volume:BP high but improving with HD.  Cont home meds. B effusions in lungs but unclear if we can touch this with dialysis, likely malignant effusions - UF as tolerated. 5. Anemia:Hgb 8.5 today.  s/p 1 u prbc 1/7.  Typically ESAs are held in setting of cancer. In her case however, may need to weigh against risks of repeated transfusions, esp when cure is not expected. Follow trend.  6. Metabolic bone disease:CorrCa slightly high, no VDRA - follow.  Phos at goal. 7. Nutrition:Alb low, continue protein supplements.  Renal diet w/fluid restrictions.  8. DNR 9. Dispo: SNF placement pending     Jen Mow, PA-C [facility] 12/23/2020,2:25 PM  LOS: 5 days

## 2020-12-23 NOTE — Discharge Summary (Signed)
Physician Discharge Summary  Cassandra Dyer KGM:[id number] DOB: 05/16/1941 DOA: 12/17/2020  PCP: Biagio Borg, MD  Admit date: 12/17/2020 Discharge date: 12/23/2020  Time spent: 35 minutes  Recommendations for Outpatient Follow-up:  1. PCP in 1 week 2. Oncology Dr. Earlie Server in 2 weeks 3. Please continue palliative care discussions 4. Continue Keppra 500 mg daily on dialysis days to be taken immediately after dialysis Monday Wednesday Friday   Discharge Diagnoses:  Principal Problem:   Seizures ([place]) Brain metastasis Stage IV lung cancer   Essential hypertension   Dyslipidemia   Hyperkalemia   Anemia   ESRD (end stage renal disease) (HCC)   Volume overload   Brain metastases (HCC)   Hyponatremia   Seizure ([place]) DO NOT RESUSCITATE   Discharge Condition: Stable  Diet recommendation: Renal  Filed Weights   12/20/20 0955 12/20/20 1325 12/20/20 2113  Weight: 53.3 kg 52 kg 52.9 kg    History of present illness:  Cassandra Dyer is a 80 y.o.femalewith medical history significant ofstage IV lung cancer with brain metastasis, anemia, ESRD on HD Monday Wednesday Friday, COPD, hyperlipidemia, hypertension, hepatitis C, renal cell carcinoma,and thrombocytopeniawho presented following 2 to 3 days of left leg pain and right arm shaking. Patient reportedly has had to 3 days of left leg pain and right arm shaking. She missed her dialysis session due to this. While in the ED she acutely became altered and had an apparent seizure  Hospital Course:   Seizure -Admitted with confusion, possible focal seizure involving right arm followed by tonic-clonic seizure in the ED  -Known history of lung cancer with brain mets -UYQ:[id number] of epileptogenicity arising from left frontal region as well as cortical dysfunction in left frontal region likely secondary to underlying structural abnormalities. -MRIbrain:Probable mild disease progression as compared to previous MRI from  11/27/2019 as evidenced by mildly increased vasogenic edema about a few lesions situated at the left frontal lobe and left cerebellum. -Seen by neurology in consultation, started on Keppra this is to be taken immediately after dialysis on dialysis days-MWF -PT OT eval completed, SNF recommended -Discharge to SNF for rehabilitation  ESRD, on HD MWF -Nephrology consulted  Stage IV lung cancer with brain mets -Continue Decadron4 mg 3 times daily -Follows withDr. Julien Nordmann -On palliative chemotherapy and recently completed SRS for brain mets -Follow-up at the cancer center for chemo every 3 weeks -Also needs ongoing palliative care conversations  Essential hypertension -Continue Coreg, Norvasc, ARB  Hyperlipidemia -Continue Crestor  Leukocytosis -Secondary to advanced malignancy and high-dose Decadron --No signs or symptoms of acute infectious process, remains afebrile and nontoxic  Anemia of chronic disease Secondary to stage IV lung cancer and ESRD -No active bleeding, transfused 1 unit of PRBC on 1/7 -Hemoglobin stable  Code Status: DNR  Discharge Exam: Vitals:   12/23/20 0639 12/23/20 1008  BP: (!) 190/68 (!) 163/66  Pulse: 75 74  Resp: 18 16  Temp: 98.3 F (36.8 C) 99.3 F (37.4 C)  SpO2: 97% 94%    General: Awake alert oriented x3 Cardiovascular: S1-S2, regular rate rhythm Respiratory: Decreased breath sounds bases  Discharge Instructions   Discharge Instructions    Call MD for:  difficulty breathing, headache or visual disturbances   Complete by: As directed    Call MD for:  extreme fatigue   Complete by: As directed    Call MD for:  persistant dizziness or light-headedness   Complete by: As directed    Call MD for:  persistant nausea and vomiting  Complete by: As directed    Call MD for:  severe uncontrolled pain   Complete by: As directed    Call MD for:  temperature >100.4   Complete by: As directed    Diet - low sodium heart healthy    Complete by: As directed    Discharge instructions   Complete by: As directed    You were cared for by a hospitalist during your hospital stay. If you have any questions about your discharge medications or the care you received while you were in the hospital after you are discharged, you can call the unit and ask to speak with the hospitalist on call if the hospitalist that took care of you is not available. Once you are discharged, your primary care physician will handle any further medical issues. Please note that NO REFILLS for any discharge medications will be authorized once you are discharged, as it is imperative that you return to your primary care physician (or establish a relationship with a primary care physician if you do not have one) for your aftercare needs so that they can reassess your need for medications and monitor your lab values.   Discharge instructions   Complete by: As directed    Renal diet   Discharge wound care:   Complete by: As directed    Wound care to full thickness lesions on coccygeal area:  Wash area with soap and water, rinse and pat dry. Cover with xeroform gauze Kellie Simmering # [id number]), top with dry gauze and cover with silicone foam dressing for sacrum.   Discharge wound care:   Complete by: As directed    routine   Increase activity slowly   Complete by: As directed    Increase activity slowly   Complete by: As directed      Allergies as of 12/23/2020   No Known Allergies     Medication List    STOP taking these medications   docusate sodium 100 MG capsule Commonly known as: COLACE   enoxaparin 30 MG/0.3ML injection Commonly known as: LOVENOX   ondansetron 4 MG tablet Commonly known as: Zofran   oxyCODONE-acetaminophen 5-325 MG tablet Commonly known as: Percocet     TAKE these medications   amLODipine 10 MG tablet Commonly known as: NORVASC Take 1 tablet (10 mg total) by mouth daily.   ascorbic acid 500 MG tablet Commonly known as: VITAMIN  C Take 500 mg by mouth 2 (two) times daily.   Black Currant Seed Oil 500 MG Caps Take 500 mg by mouth every morning.   carvedilol 12.5 MG tablet Commonly known as: COREG Take 1 tablet (12.5 mg total) by mouth 2 (two) times daily with a meal.   dexamethasone 4 MG tablet Commonly known as: DECADRON Take 1 tablet (4 mg total) by mouth every 8 (eight) hours. What changed: when to take this   FISH OIL PO Take 1 capsule by mouth every morning.   levETIRAcetam 500 MG 24 hr tablet Commonly known as: KEPPRA XR Take 1 tablet (500 mg total) by mouth daily. On day of dialysis, take it immediately after dialysis.   multivitamin with minerals Tabs tablet Take 1 tablet by mouth every morning.   OXYGEN Inhale 2 L into the lungs continuous.   pantoprazole 40 MG tablet Commonly known as: PROTONIX Take 1 tablet (40 mg total) by mouth daily.   polyethylene glycol 17 g packet Commonly known as: MIRALAX / GLYCOLAX Take 17 g by mouth daily as needed for mild  constipation.   prochlorperazine 10 MG tablet Commonly known as: COMPAZINE Take 1 tablet (10 mg total) by mouth every 6 (six) hours as needed.   rosuvastatin 10 MG tablet Commonly known as: CRESTOR Take 1 tablet (10 mg total) by mouth daily.   senna-docusate 8.6-50 MG tablet Commonly known as: Senokot-S Take 1 tablet by mouth daily as needed for mild constipation.   telmisartan 80 MG tablet Commonly known as: MICARDIS Take 80 mg by mouth daily.   Vitamin D3 50 MCG (2000 UT) capsule Take 2,000 Units by mouth every morning.            Discharge Care Instructions  (From admission, onward)         Start     Ordered   12/23/20 0000  Discharge wound care:       Comments: routine   12/23/20 1133   12/19/20 0000  Discharge wound care:       Comments: Wound care to full thickness lesions on coccygeal area:  Wash area with soap and water, rinse and pat dry. Cover with xeroform gauze Kellie Simmering # [id number]), top with dry gauze and  cover with silicone foam dressing for sacrum.   12/19/20 1233         No Known Allergies  Follow-up Information    Biagio Borg, MD. Schedule an appointment as soon as possible for a visit in 1 week(s).   Specialties: Internal Medicine, Radiology Contact information: [place] [address] [phone]        Curt Bears, MD. Go on 12/26/2020.   Specialty: Oncology Contact information: [place] [address] [place] Follow up.   Contact information: [address] [place], [place] [address] [phone]               The results of significant diagnostics from this hospitalization (including imaging, microbiology, ancillary and laboratory) are listed below for reference.    Significant Diagnostic Studies: CT Head Wo Contrast  Result Date: 12/17/2020 CLINICAL DATA:  Mental status change EXAM: CT HEAD WITHOUT CONTRAST TECHNIQUE: Contiguous axial images were obtained from the base of the skull through the vertex without intravenous contrast. COMPARISON:  MRI 11/26/2020 FINDINGS: Brain: No acute interval hemorrhage. Stable ventricle size. Grossly similar edema within the left parietal white matter. Slightly dense metastatic lesions are again noted. 1.4 x 1.3 cm left parietal mass, series 4, image number 26. Small 7 mm hyperdense mass within the left frontal lobe, series 4, image number 25. Hyperdense mass within the right lateral ventricle measuring 1.6 x 2.3 cm. Approximate 14 mm left cerebellar lesion vaguely visualized. The other metastatic lesions on MRI are better seen on MRI. No significant midline shift. Vascular: No hyperdense vessels.  Carotid vascular calcification. Skull: Normal. Negative for fracture or focal lesion. Sinuses/Orbits: No acute finding. Other: None IMPRESSION: 1. No definite acute interval change as compared with MRI from December 2021 allowing for limitations of inter  modality comparison. Intracranial metastatic disease with grossly stable edema within the left parietal white matter. Only some of the metastatic lesions are appreciated on this non contrasted CT. 2. Mild chronic small vessel ischemic change of the white matter. Electronically Signed   By: Donavan Foil M.D.   On: 12/17/2020 19:41   MR BRAIN WO CONTRAST  Result Date: 12/17/2020 CLINICAL DATA:  Initial evaluation for acute seizure, history of metastatic lung carcinoma. EXAM: MRI HEAD WITHOUT CONTRAST  TECHNIQUE: Multiplanar, multiecho pulse sequences of the brain and surrounding structures were obtained without intravenous contrast. COMPARISON:  Prior CT from earlier same day as well as recent brain MRI from 11/26/2020. FINDINGS: Brain: Again seen are multiple lesions scattered throughout the supratentorial and infratentorial brain, consistent with known intracranial metastatic disease. Overall number of these lesions appears grossly similar, although exact delineation limited due to lack of IV contrast. Associated vasogenic edema about a lesion centered at the posterior left frontoparietal region is similar to perhaps mildly increased. Additionally, there is mildly increased vasogenic edema seen about a few lesions at the left frontal lobe anteriorly (series 11, images 21, 22). Increased edema also seen about a lesion at the peripheral left cerebellum (series 11, image 7). The lesion positioned along the posterior septum pellucidum may be slightly increased in size measuring 2.6 x 1.9 cm on today's exam, previously 2.4 x 1.7 cm when measured in a similar fashion. Overall, appearance suggest mild disease progression as compared to previous. No midline shift. Associated susceptibility artifact about a lesion at the left occipital lobe is stable from previous. No other associated hemorrhage. No evidence for acute or subacute infarct. No other diffusion abnormality to suggest changes related to seizure. No other  evidence for acute or chronic intracranial hemorrhage. No hydrocephalus or ventricular trapping. No extra-axial fluid collection. Pituitary gland suprasellar region within normal limits. Vascular: Major intracranial vascular flow voids are maintained. Skull and upper cervical spine: Craniocervical junction within normal limits. Bone marrow signal intensity normal. No scalp soft tissue abnormality. Sinuses/Orbits: Patient status post bilateral ocular lens replacement. No acute abnormality about the globes and orbital soft tissues. Paranasal sinuses are largely clear. No significant mastoid effusion. Other: None. IMPRESSION: 1. Probable mild disease progression as compared to previous MRI from 11/27/2019 as evidenced by mildly increased vasogenic edema about a few lesions situated at the left frontal lobe and left cerebellum as above. No midline shift or hydrocephalus. 2. No other acute intracranial abnormality. Electronically Signed   By: Jeannine Boga M.D.   On: 12/17/2020 23:52   MR BRAIN WO CONTRAST  Result Date: 11/24/2020 CLINICAL DATA:  80 year old female with cerebral metastatic disease. Postoperative day 2 ORIF for pathologic right femoral fracture. EXAM: MRI HEAD WITHOUT CONTRAST TECHNIQUE: Multiplanar, multiecho pulse sequences of the brain and surrounding structures were obtained without intravenous contrast. COMPARISON:  Brain MRI without contrast 01/23/2020. FINDINGS: Brain: Confluent vasogenic edema in the posterior left frontal and superior left parietal lobes surrounding a lobulated 14 mm presumed isointense cerebral metastasis is stable from 11/21/2020. No significant regional mass effect. Other predominantly T2 dark, FLAIR heterogeneous brain masses appear stable, some with associated hemosiderin as before (medial left occipital lobe). And additional lesions are possible given the absence of IV contrast on both of these exams. However, one lesion of the anterior left frontal lobe has  substantially regressed in the short-term based on T2 and FLAIR imaging (series 6, image 21) with resolved adjacent edema. No new areas of vasogenic edema. No significant intracranial mass effect. No superimposed restricted diffusion to suggest acute infarction. No ventriculomegaly, extra-axial collection or acute intracranial hemorrhage. Cervicomedullary junction and pituitary are within normal limits. Vascular: Major intracranial vascular flow voids are stable. Dominant right vertebral artery again suspected. Skull and upper cervical spine: Visualized bone marrow signal is within normal limits. Grossly normal visible cervical spine. Sinuses/Orbits: Stable, negative. Other: Mastoids remain clear. Grossly normal visible internal auditory structures. IMPRESSION: 1. Stable metastatic disease related cerebral edema since the noncontrast scan  on 11/21/2020. No new or progressive lesions are evident in the absence of contrast. Postcontrast imaging likely would identify additional small metastases. 2. Regression of a single small anterior left frontal lobe lesion since 11/21/2020 with largely resolved edema there. 3. No new intracranial abnormality. Electronically Signed   By: Genevie Ann M.D.   On: 11/24/2020 15:53   MR BRAIN W WO CONTRAST  Result Date: 11/26/2020 CLINICAL DATA:  Metastatic lung cancer, abnormal noncontrast brain MRI EXAM: MRI HEAD WITHOUT AND WITH CONTRAST TECHNIQUE: Multiplanar, multiecho pulse sequences of the brain and surrounding structures were obtained without and with intravenous contrast. CONTRAST:  43mL GADAVIST GADOBUTROL 1 MMOL/ML IV SOLN COMPARISON:  Multiple recent noncontrast studies FINDINGS: Brain: Multiple lesions are again identified and are annotated on series 1100. There has been no change in size since recent prior imaging. A total of 8 lesions are identified on images 111, 146, 160, 177, 205, 217, 224, and 229. In comparison to the noncontrast imaging, this reflects identification  of an additional left frontal and a right thalamic metastasis. The left occipital lesion demonstrates intrinsic T1 shortening and corresponding susceptibility and could conceivably reflect a cavernous malformation but given associated edema, metastasis is more likely. Stable associated edema. No significant mass effect. Incidental note is made of a left frontal developmental venous anomaly with adjacent gliosis. No acute infarction hemorrhage. Vascular: Major vessel flow voids at the skull base are preserved. Skull and upper cervical spine: No new findings. Sinuses/Orbits: No new findings. Other: Sella is unremarkable.  Mastoid air cells are clear. IMPRESSION: 8 metastatic lesions with stable edema. Left frontal and right thalamic lesions identified in addition to those on prior noncontrast imaging. Electronically Signed   By: Macy Mis M.D.   On: 11/26/2020 13:29   DG Chest Port 1 View  Result Date: 12/17/2020 CLINICAL DATA:  Altered mental status, leg swelling EXAM: PORTABLE CHEST 1 VIEW COMPARISON:  November 20, 2020 FINDINGS: Bilateral layering pleural effusions with adjacent atelectasis. Probable mild pulmonary edema. No pneumothorax. Stable cardiomediastinal contours. Right tunneled line is unchanged. IMPRESSION: Bilateral layering pleural effusions with adjacent atelectasis. Probable mild pulmonary edema. Electronically Signed   By: Macy Mis M.D.   On: 12/17/2020 19:46   EEG adult  Result Date: 12/18/2020 Lora Havens, MD     12/18/2020 10:10 AM Patient Name: HARMONI LUCUS MRN: [id number] Epilepsy Attending: Lora Havens Referring Physician/Provider: Dr Neva Seat Date: 12/18/2020 Duration: 24.04 mins Patient history: 79 year old female who recently started radiation therapy for brain metastasis who presents with jerking type movement which would occur and last for a little bit before pausing for a little bit, on and off for about 5 minutes total. EEG to evaluate for seizure. Level  of alertness: Awake, asleep AEDs during EEG study: LEV Technical aspects: This EEG study was done with scalp electrodes positioned according to the 10-20 International system of electrode placement. Electrical activity was acquired at a sampling rate of 500Hz  and reviewed with a high frequency filter of 70Hz  and a low frequency filter of 1Hz . EEG data were recorded continuously and digitally stored. Description: The posterior dominant rhythm consists of 8-9 Hz activity of moderate voltage (25-35 uV) seen predominantly in posterior head regions, symmetric and reactive to eye opening and eye closing. Sleep was characterized by vertex waves, sleep spindles (12 to 14 Hz), maximal frontocentral region. EEG showed intermittent generalized and maximal left frontal 3 to 6 Hz theta-delta slowing.  Sharp waves are also noted in left frontal region.  Hyperventilation and photic stimulation were not performed.   ABNORMALITY -Sharp wave, left frontal region -Intermittent slow, generalized and maximal left frontal region IMPRESSION: This study showed evidence of epileptogenicity arising from left frontal region as well as cortical dysfunction in left frontal region likely secondary to underlying structural abnormalities.  Additionally there is evidence of mild diffuse encephalopathy, nonspecific etiology.  No seizures were seen throughout the recording. Priyanka O Yadav   [place] EKG SITE RITE  Result Date: 12/19/2020 If Site Rite image not attached, placement could not be confirmed due to current cardiac rhythm.   Microbiology: Recent Results (from the past 240 hour(s))  SARS CORONAVIRUS 2 (TAT 6-24 HRS) Nasopharyngeal Nasopharyngeal Swab     Status: None   Collection Time: 12/17/20  7:53 PM   Specimen: Nasopharyngeal Swab  Result Value Ref Range Status   SARS Coronavirus 2 NEGATIVE NEGATIVE Final    Comment: (NOTE) SARS-CoV-2 target nucleic acids are NOT DETECTED.  The SARS-CoV-2 RNA is generally detectable in upper  and lower respiratory specimens during the acute phase of infection. Negative results do not preclude SARS-CoV-2 infection, do not rule out co-infections with other pathogens, and should not be used as the sole basis for treatment or other patient management decisions. Negative results must be combined with clinical observations, patient history, and epidemiological information. The expected result is Negative.  Fact Sheet for Patients: SugarRoll.be  Fact Sheet for Healthcare Providers: https://www.woods-mathews.com/  This test is not yet approved or cleared by the [place] FDA and  has been authorized for detection and/or diagnosis of SARS-CoV-2 by FDA under an Emergency Use Authorization (EUA). This EUA will remain  in effect (meaning this test can be used) for the duration of the COVID-19 declaration under Se ction 564(b)(1) of the Act, 21 U.S.C. section 360bbb-3(b)(1), unless the authorization is terminated or revoked sooner.  Performed at [facility], [place] [address] [place], [place] [address]   SARS CORONAVIRUS 2 (TAT 6-24 HRS) Nasopharyngeal Nasopharyngeal Swab     Status: None   Collection Time: 12/22/20  2:13 PM   Specimen: Nasopharyngeal Swab  Result Value Ref Range Status   SARS Coronavirus 2 NEGATIVE NEGATIVE Final    Comment: (NOTE) SARS-CoV-2 target nucleic acids are NOT DETECTED.  The SARS-CoV-2 RNA is generally detectable in upper and lower respiratory specimens during the acute phase of infection. Negative results do not preclude SARS-CoV-2 infection, do not rule out co-infections with other pathogens, and should not be used as the sole basis for treatment or other patient management decisions. Negative results must be combined with clinical observations, patient history, and epidemiological information. The expected result is Negative.  Fact Sheet for  Patients: SugarRoll.be  Fact Sheet for Healthcare Providers: https://www.woods-mathews.com/  This test is not yet approved or cleared by the [place] FDA and  has been authorized for detection and/or diagnosis of SARS-CoV-2 by FDA under an Emergency Use Authorization (EUA). This EUA will remain  in effect (meaning this test can be used) for the duration of the COVID-19 declaration under Se ction 564(b)(1) of the Act, 21 U.S.C. section 360bbb-3(b)(1), unless the authorization is terminated or revoked sooner.  Performed at [facility], [place] [address], [place], [place] [address]      Labs: Basic Metabolic Panel: Recent Labs  Lab 12/17/20 1636 12/18/20 0539 12/19/20 0237 12/20/20 0829 12/21/20 0235  NA 129* 131* 136 131* 135  K 5.7* 5.1 3.7 4.2 3.1*  CL 88* 87* 95* 92* 94*  CO2 19* 24 27 21* 26  GLUCOSE 139* 147* 132* 189* 226*  BUN 123* 132* 54* 83* 32*  CREATININE 6.93* 7.35* 4.10* 5.50* 3.13*  CALCIUM 8.3* 8.2* 8.0* 8.2* 7.9*   Liver Function Tests: Recent Labs  Lab 12/18/20 0539  AST 20  ALT 22  ALKPHOS 151*  BILITOT 1.4*  PROT 4.7*  ALBUMIN 2.0*   No results for input(s): LIPASE, AMYLASE in the last 168 hours. Recent Labs  Lab 12/18/20 0057  AMMONIA 23   CBC: Recent Labs  Lab 12/17/20 1636 12/18/20 0539 12/19/20 0237 12/19/20 1019 12/20/20 0829 12/20/20 1901 12/21/20 0235  WBC 56.2* 41.4* 33.7*  --  35.7*  --  32.1*  HGB 10.0* 8.1* 6.6* 7.1* 6.8* 9.4* 8.4*  HCT 31.7* 24.2* 19.4* 21.9* 21.1* 27.6* 24.7*  MCV 90.8 85.8 87.0  --  89.4  --  89.2  PLT 197 171 150  --  156  --  139*   Cardiac Enzymes: No results for input(s): CKTOTAL, CKMB, CKMBINDEX, TROPONINI in the last 168 hours. BNP: BNP (last 3 results) No results for input(s): BNP in the last 8760 hours.  ProBNP (last 3 results) No results for input(s): PROBNP in the last 8760 hours.  CBG: Recent Labs  Lab 12/17/20 1855  GLUCAP  158*       Signed:  Domenic Polite MD.  [facility] 12/23/2020, 11:33 AM

## 2020-12-23 NOTE — Care Management Important Message (Signed)
Important Message  Patient Details  Name: Cassandra Dyer MRN: [id number] Date of Birth: 10-26-1941   Medicare Important Message Given:  Yes     Barb Merino Diallo Ponder 12/23/2020, 2:17 PM

## 2020-12-23 NOTE — Plan of Care (Signed)
  Problem: Nutrition: Goal: Adequate nutrition will be maintained Outcome: Adequate for Discharge   

## 2020-12-24 ENCOUNTER — Telehealth: Payer: Self-pay | Admitting: Nephrology

## 2020-12-24 ENCOUNTER — Encounter (HOSPITAL_COMMUNITY): Admission: RE | Admit: 2020-12-24 | Payer: Medicare Other | Source: Ambulatory Visit

## 2020-12-24 ENCOUNTER — Telehealth: Payer: Self-pay | Admitting: *Deleted

## 2020-12-24 ENCOUNTER — Telehealth: Payer: Self-pay | Admitting: Medical Oncology

## 2020-12-24 NOTE — Consult Note (Signed)
   [facility] CM Inpatient Consult   12/24/2020  ALVEENA TAIRA 1941/07/12 [id number]  Late entry for 12/23/2020 [facility] [ACO] Patient: Medicare  Patient was reviewed for transition of care. Patient will be alerted to be followed by [facility] PAC with [employer] [facility].  Patient transitioning to [facility] for [place] [SNF] discharge. Thank you.  Natividad Brood, RN BSN [facility] Liaison  [phone] business mobile phone Toll free office [phone]  Fax number: [phone] [email or web address]   .

## 2020-12-24 NOTE — Progress Notes (Signed)
Patient discharged to [facility] transported by [facility]. Report already called by Day RN. Belongings sent with patient,upper and lower dentures,shirt and slippers. AVS given to [facility]AR transporter.

## 2020-12-24 NOTE — Telephone Encounter (Signed)
Received vm call from sister, Enid Derry with questions about appts.  Verified pt has appt for 12/26/20 @ 10:30 am for lab & then see Dr Julien Nordmann.  She states pt was supposed to have a scan today but pt has been in hospital & was transferred to [facility] early this am.  Per previous note from Dr Worthy Flank RN, pt will keep appt.  Informed that if Dr Julien Nordmann wants scan, he will reorder.  She expressed understanding of appts.

## 2020-12-24 NOTE — Telephone Encounter (Signed)
Transition of Care Contact from [place]  Date of Discharge: 12/23/20 Date of Contact: 12/24/20 Method of contact: phone - attempted  Attempted to contact patient to discuss transition of care from inpatient admission.  Patient did not answer the phone.  Unable to leave voicemail as it was not set up.  Will attempt to call again or follow up at dialysis.  Jen Mow, PA-C [facility] Pager: [phone]

## 2020-12-24 NOTE — Telephone Encounter (Signed)
Pt resides at [facility] care center for now for rehab. She wants to keep her appt at the cancer center tomorrow.

## 2020-12-25 ENCOUNTER — Telehealth: Payer: Self-pay | Admitting: Radiation Therapy

## 2020-12-25 ENCOUNTER — Ambulatory Visit: Payer: Medicare Other | Admitting: Internal Medicine

## 2020-12-25 ENCOUNTER — Other Ambulatory Visit: Payer: Medicare Other

## 2020-12-25 NOTE — Telephone Encounter (Signed)
The patient's sister, Enid Derry, called to inform us that Oral has tested positive for Covid at the facility she is staying. She has an appointment with Dr. Julien Nordmann tomorrow and wants to know how to proceed. I messaged Diane Clinical research associate, about this and have asked her to follow-up with the patient's sister for next steps.   Mont Dutton R.T.(R)(T) Radiation Special Procedures Navigator

## 2020-12-26 ENCOUNTER — Inpatient Hospital Stay: Payer: Medicare Other

## 2020-12-26 ENCOUNTER — Inpatient Hospital Stay: Payer: Medicare Other | Admitting: Internal Medicine

## 2020-12-26 ENCOUNTER — Telehealth: Payer: Self-pay

## 2020-12-26 NOTE — Telephone Encounter (Signed)
Attempted to contact patient to discuss transition of care from inpatient admission from [facility] on 12/24/2020.  Patient did not answer the phone.  Unable to leave voicemail as it was not set up.  Will attempt to call again or follow up.

## 2020-12-27 ENCOUNTER — Other Ambulatory Visit: Payer: Self-pay | Admitting: *Deleted

## 2020-12-27 NOTE — Patient Outreach (Signed)
Member screened for potential [facility] Care Management needs.  Ms. Castrellon is receiving skilled therapy at [facility] SN[facility]. Update received from [facility] indicating member has tested positive for COVID.  Member was active with [facility] prior to admission.   Will continue to follow for transition plans and for [facility] re-engagement.   Marthenia Rolling, MSN, RN,BSN [place] Acute Care Coordinator [phone] [facility]) [phone]  (Toll free office)

## 2021-01-07 ENCOUNTER — Encounter (HOSPITAL_COMMUNITY): Payer: Self-pay | Admitting: Internal Medicine

## 2021-01-08 MED FILL — Dexamethasone Sodium Phosphate Inj 100 MG/10ML: INTRAMUSCULAR | Qty: 1 | Status: AC

## 2021-01-08 MED FILL — Fosaprepitant Dimeglumine For IV Infusion 150 MG (Base Eq): INTRAVENOUS | Qty: 5 | Status: AC

## 2021-01-09 ENCOUNTER — Inpatient Hospital Stay (HOSPITAL_BASED_OUTPATIENT_CLINIC_OR_DEPARTMENT_OTHER): Payer: Medicare Other | Admitting: Internal Medicine

## 2021-01-09 ENCOUNTER — Inpatient Hospital Stay: Payer: Medicare Other

## 2021-01-09 ENCOUNTER — Telehealth: Payer: Self-pay | Admitting: Medical Oncology

## 2021-01-09 ENCOUNTER — Other Ambulatory Visit: Payer: Self-pay | Admitting: Medical

## 2021-01-09 ENCOUNTER — Other Ambulatory Visit: Payer: Self-pay

## 2021-01-09 ENCOUNTER — Other Ambulatory Visit: Payer: Self-pay | Admitting: Physician Assistant

## 2021-01-09 ENCOUNTER — Inpatient Hospital Stay: Payer: Medicare Other | Attending: Internal Medicine

## 2021-01-09 ENCOUNTER — Other Ambulatory Visit: Payer: Self-pay | Admitting: Medical Oncology

## 2021-01-09 VITALS — BP 162/88 | HR 79 | Temp 98.1°F | Resp 20

## 2021-01-09 VITALS — BP 113/50 | HR 87 | Temp 97.3°F | Resp 16 | Ht 62.0 in

## 2021-01-09 DIAGNOSIS — Z5112 Encounter for antineoplastic immunotherapy: Secondary | ICD-10-CM

## 2021-01-09 DIAGNOSIS — C7931 Secondary malignant neoplasm of brain: Secondary | ICD-10-CM | POA: Insufficient documentation

## 2021-01-09 DIAGNOSIS — C3492 Malignant neoplasm of unspecified part of left bronchus or lung: Secondary | ICD-10-CM

## 2021-01-09 DIAGNOSIS — Z79899 Other long term (current) drug therapy: Secondary | ICD-10-CM | POA: Insufficient documentation

## 2021-01-09 DIAGNOSIS — Z992 Dependence on renal dialysis: Secondary | ICD-10-CM | POA: Diagnosis not present

## 2021-01-09 DIAGNOSIS — Z905 Acquired absence of kidney: Secondary | ICD-10-CM | POA: Insufficient documentation

## 2021-01-09 DIAGNOSIS — I6782 Cerebral ischemia: Secondary | ICD-10-CM | POA: Diagnosis not present

## 2021-01-09 DIAGNOSIS — R0609 Other forms of dyspnea: Secondary | ICD-10-CM | POA: Diagnosis not present

## 2021-01-09 DIAGNOSIS — R5383 Other fatigue: Secondary | ICD-10-CM

## 2021-01-09 DIAGNOSIS — Z5111 Encounter for antineoplastic chemotherapy: Secondary | ICD-10-CM | POA: Diagnosis not present

## 2021-01-09 DIAGNOSIS — L89004 Pressure ulcer of unspecified elbow, stage 4: Secondary | ICD-10-CM

## 2021-01-09 DIAGNOSIS — M7989 Other specified soft tissue disorders: Secondary | ICD-10-CM | POA: Diagnosis not present

## 2021-01-09 DIAGNOSIS — R52 Pain, unspecified: Secondary | ICD-10-CM

## 2021-01-09 DIAGNOSIS — Z7952 Long term (current) use of systemic steroids: Secondary | ICD-10-CM | POA: Diagnosis not present

## 2021-01-09 DIAGNOSIS — C3432 Malignant neoplasm of lower lobe, left bronchus or lung: Secondary | ICD-10-CM | POA: Diagnosis present

## 2021-01-09 DIAGNOSIS — N186 End stage renal disease: Secondary | ICD-10-CM | POA: Diagnosis not present

## 2021-01-09 DIAGNOSIS — D509 Iron deficiency anemia, unspecified: Secondary | ICD-10-CM

## 2021-01-09 DIAGNOSIS — J90 Pleural effusion, not elsewhere classified: Secondary | ICD-10-CM | POA: Diagnosis not present

## 2021-01-09 DIAGNOSIS — I120 Hypertensive chronic kidney disease with stage 5 chronic kidney disease or end stage renal disease: Secondary | ICD-10-CM | POA: Diagnosis not present

## 2021-01-09 DIAGNOSIS — M6281 Muscle weakness (generalized): Secondary | ICD-10-CM | POA: Insufficient documentation

## 2021-01-09 DIAGNOSIS — G9340 Encephalopathy, unspecified: Secondary | ICD-10-CM | POA: Diagnosis not present

## 2021-01-09 DIAGNOSIS — Z85528 Personal history of other malignant neoplasm of kidney: Secondary | ICD-10-CM | POA: Insufficient documentation

## 2021-01-09 DIAGNOSIS — Z8619 Personal history of other infectious and parasitic diseases: Secondary | ICD-10-CM | POA: Diagnosis not present

## 2021-01-09 LAB — CBC WITH DIFFERENTIAL (CANCER CENTER ONLY)
Abs Immature Granulocytes: 0.83 10*3/uL — ABNORMAL HIGH (ref 0.00–0.07)
Basophils Absolute: 0 10*3/uL (ref 0.0–0.1)
Basophils Relative: 0 %
Eosinophils Absolute: 0 10*3/uL (ref 0.0–0.5)
Eosinophils Relative: 0 %
HCT: 20.2 % — ABNORMAL LOW (ref 36.0–46.0)
Hemoglobin: 6.3 g/dL — CL (ref 12.0–15.0)
Immature Granulocytes: 2 %
Lymphocytes Relative: 1 %
Lymphs Abs: 0.6 10*3/uL — ABNORMAL LOW (ref 0.7–4.0)
MCH: 30.1 pg (ref 26.0–34.0)
MCHC: 31.2 g/dL (ref 30.0–36.0)
MCV: 96.7 fL (ref 80.0–100.0)
Monocytes Absolute: 1.3 10*3/uL — ABNORMAL HIGH (ref 0.1–1.0)
Monocytes Relative: 3 %
Neutro Abs: 43.3 10*3/uL — ABNORMAL HIGH (ref 1.7–7.7)
Neutrophils Relative %: 94 %
Platelet Count: 167 10*3/uL (ref 150–400)
RBC: 2.09 MIL/uL — ABNORMAL LOW (ref 3.87–5.11)
RDW: 15.9 % — ABNORMAL HIGH (ref 11.5–15.5)
WBC Count: 46 10*3/uL — ABNORMAL HIGH (ref 4.0–10.5)
nRBC: 0 % (ref 0.0–0.2)

## 2021-01-09 LAB — CMP (CANCER CENTER ONLY)
ALT: 27 U/L (ref 0–44)
AST: 33 U/L (ref 15–41)
Albumin: 1.8 g/dL — ABNORMAL LOW (ref 3.5–5.0)
Alkaline Phosphatase: 248 U/L — ABNORMAL HIGH (ref 38–126)
Anion gap: 10 (ref 5–15)
BUN: 24 mg/dL — ABNORMAL HIGH (ref 8–23)
CO2: 32 mmol/L (ref 22–32)
Calcium: 9 mg/dL (ref 8.9–10.3)
Chloride: 97 mmol/L — ABNORMAL LOW (ref 98–111)
Creatinine: 2.09 mg/dL — ABNORMAL HIGH (ref 0.44–1.00)
GFR, Estimated: 24 mL/min — ABNORMAL LOW (ref >60)
Glucose, Bld: 122 mg/dL — ABNORMAL HIGH (ref 70–99)
Potassium: 3.8 mmol/L (ref 3.5–5.1)
Sodium: 139 mmol/L (ref 135–145)
Total Bilirubin: 0.5 mg/dL (ref 0.3–1.2)
Total Protein: 5.6 g/dL — ABNORMAL LOW (ref 6.5–8.1)

## 2021-01-09 LAB — TSH: TSH: 0.601 u[IU]/mL (ref 0.308–3.960)

## 2021-01-09 LAB — SAMPLE TO BLOOD BANK

## 2021-01-09 LAB — PREPARE RBC (CROSSMATCH)

## 2021-01-09 MED ORDER — DIPHENHYDRAMINE HCL 25 MG PO CAPS
25.0000 mg | ORAL_CAPSULE | Freq: Once | ORAL | Status: AC
  Administered 2021-01-09: 25 mg via ORAL

## 2021-01-09 MED ORDER — ACETAMINOPHEN 325 MG PO TABS
650.0000 mg | ORAL_TABLET | Freq: Once | ORAL | Status: AC
  Administered 2021-01-09: 650 mg via ORAL

## 2021-01-09 MED ORDER — OXYCODONE HCL 5 MG PO TABS
ORAL_TABLET | ORAL | Status: AC
  Filled 2021-01-09: qty 1

## 2021-01-09 MED ORDER — OXYCODONE HCL 5 MG PO TABS
5.0000 mg | ORAL_TABLET | Freq: Once | ORAL | Status: AC
  Administered 2021-01-09: 5 mg via ORAL

## 2021-01-09 MED ORDER — SODIUM CHLORIDE 0.9% IV SOLUTION
250.0000 mL | Freq: Once | INTRAVENOUS | Status: AC
  Administered 2021-01-09: 250 mL via INTRAVENOUS
  Filled 2021-01-09: qty 250

## 2021-01-09 MED ORDER — ACETAMINOPHEN 325 MG PO TABS
ORAL_TABLET | ORAL | Status: AC
  Filled 2021-01-09: qty 2

## 2021-01-09 MED ORDER — DIPHENHYDRAMINE HCL 25 MG PO CAPS
ORAL_CAPSULE | ORAL | Status: AC
  Filled 2021-01-09: qty 1

## 2021-01-09 NOTE — Telephone Encounter (Signed)
Hospice notified to contact Eddie Dibbles or [facility] re visit.

## 2021-01-09 NOTE — Progress Notes (Signed)
Cassandra Dyer [phone]   Fax:([phone]  OFFICE PROGRESS NOTE  Biagio Borg, MD [place] [address]  DIAGNOSIS: stage IV (T3, N2, M1c) non-small cell lung cancer, adenocarcinoma presented with large left lower lobe lung mass in addition to left hilar and mediastinal lymphadenopathy as well as pleural-based metastasis in the left lung diagnosed in November 2021. She has metastatic disease to the brain. pending further staging work-up with PET scan.  DETECTED ALTERATION(S) / BIOMARKER(S)     % CFDNA OR AMPLIFICATION        ASSOCIATED FDA-APPROVED THERAPIES         CLINICAL TRIAL AVAILABILITY [id number] 16.5% None     Yes [id number] 0.8% None     Yes [id number] 15.3% None     Yes  PDL1 Expression: 25%  PRIOR THERAPY: None  CURRENT THERAPY: 1) SRS to the metastatic brain lesions under the care of Dr. MLisbeth Renshaw Last treatment expected on 12/12/20 2) palliative systemic chemotherapy with carboplatin for an AUC of 5, paclitaxel 175 mg/m, Keytruda 200 mg IV every 3 weeks with Neulasta support.    She was supposed to start the first cycle of her treatment on December 17, 2020 but this was delayed secondary to hospitalization.    INTERVAL HISTORY: Cassandra SCALF80y.o. female returns to the clinic today for follow-up visit.  The patient continues to complain of significant fatigue and weakness as well as pain on the left side of the chest and shortness of breath at baseline increased with exertion.  She was supposed to start palliative systemic chemotherapy 3 weeks ago but this was delayed because of her hospitalization for the shortness of breath and the left-sided chest pain.  The patient was also found at that time to have severe anemia and she received PRBCs transfusion.  She denied having any current nausea, vomiting, diarrhea or constipation.  She has no headache or visual changes.  She has no fever or chills.  She is currently  on dialysis on Monday, Wednesday and Fridays.  She is here today for evaluation before restarting her chemotherapy.  MEDICAL HISTORY: Past Medical History:  Diagnosis Date  . Anemia   . [place]   . Hepatitis    hx of hep C - 10-20 years ago   . History of blood transfusion   . History of kidney cancer   . Hyperlipidemia   . Hypertension    not taken b/p med in 2-3 years md deceased never went to another md   . [place] cell [place]  . Stage 4 lung cancer ([facility]    new - due to start therapy with next appt on 12/14    ALLERGIES:  has No Known Allergies.  MEDICATIONS:  Current Outpatient Medications  Medication Sig Dispense Refill  . amLODipine (NORVASC) 10 MG tablet Take 1 tablet (10 mg total) by mouth daily. 90 tablet 3  . ascorbic acid (VITAMIN C) 500 MG tablet Take 500 mg by mouth 2 (two) times daily.     . Black Currant Seed Oil 500 MG CAPS Take 500 mg by mouth every morning.     . carvedilol (COREG) 12.5 MG tablet Take 1 tablet (12.5 mg total) by mouth 2 (two) times daily with a meal. 60 tablet 1  . Cholecalciferol (VITAMIN D3) 50 MCG (2000 UT) capsule Take 2,000 Units by mouth every morning.    .Marland Kitchendexamethasone (DECADRON) 4 MG tablet  Take 1 tablet (4 mg total) by mouth every 8 (eight) hours. 90 tablet 0  . levETIRAcetam (KEPPRA XR) 500 MG 24 hr tablet Take 1 tablet (500 mg total) by mouth daily. On day of dialysis, take it immediately after dialysis. 30 tablet 0  . Multiple Vitamin (MULTIVITAMIN WITH MINERALS) TABS tablet Take 1 tablet by mouth every morning.     . Omega-3 Fatty Acids (FISH OIL PO) Take 1 capsule by mouth every morning.     . OXYGEN Inhale 2 L into the lungs continuous.    . pantoprazole (PROTONIX) 40 MG tablet Take 1 tablet (40 mg total) by mouth daily. 30 tablet 0  . polyethylene glycol (MIRALAX / GLYCOLAX) 17 g packet Take 17 g by mouth daily as needed for mild constipation.    . prochlorperazine (COMPAZINE) 10 MG tablet Take 1  tablet (10 mg total) by mouth every 6 (six) hours as needed. 30 tablet 2  . rosuvastatin (CRESTOR) 10 MG tablet Take 1 tablet (10 mg total) by mouth daily. 90 tablet 3  . senna-docusate (SENOKOT-S) 8.6-50 MG tablet Take 1 tablet by mouth daily as needed for mild constipation.    Marland Kitchen telmisartan (MICARDIS) 80 MG tablet Take 80 mg by mouth daily.     No current facility-administered medications for this visit.    SURGICAL HISTORY:  Past Surgical History:  Procedure Laterality Date  . ANTERIOR APPROACH HEMI HIP ARTHROPLASTY Right 11/22/2020   Procedure: ANTERIOR APPROACH RIGHT HIP HEMIARTHROPLASTY;  Surgeon: Leandrew Koyanagi, MD;  Location: [facility];  Service: Orthopedics;  Laterality: Right;  Needs RNFA  . AV FISTULA PLACEMENT Left 08/07/2020   Procedure: LEFT ARM ARTERIOVENOUS (AV) FISTULA CREATION;  Surgeon: [name], MD;  Location: [place];  Service: Vascular;  Laterality: Left;  . BRONCHIAL BIOPSY  11/05/2020   Procedure: BRONCHIAL BIOPSIES;  Surgeon: Collene Gobble, MD;  Location: [facility] ENDOSCOPY;  Service: Cardiopulmonary;;  . BRONCHIAL BRUSHINGS  11/05/2020   Procedure: BRONCHIAL BRUSHINGS;  Surgeon: Collene Gobble, MD;  Location: [place];  Service: Cardiopulmonary;;  . BRONCHIAL NEEDLE ASPIRATION BIOPSY  11/05/2020   Procedure: BRONCHIAL NEEDLE ASPIRATION BIOPSIES;  Surgeon: Collene Gobble, MD;  Location: [place];  Service: Cardiopulmonary;;  . CATARACT EXTRACTION Bilateral    11/30 left /12/9 right  . ELECTROMAGNETIC NAVIGATION BROCHOSCOPY N/A 11/05/2020   Procedure: ELECTROMAGNETIC NAVIGATION BRONCHOSCOPY;  Surgeon: Collene Gobble, MD;  Location: [place];  Service: Cardiopulmonary;  Laterality: N/A;  . ENDOBRONCHIAL ULTRASOUND N/A 11/05/2020   Procedure: ENDOBRONCHIAL ULTRASOUND;  Surgeon: Collene Gobble, MD;  Location: [facility] ENDOSCOPY;  Service: Cardiopulmonary;  Laterality: N/A;  . FINE NEEDLE ASPIRATION  11/05/2020   Procedure: FINE NEEDLE ASPIRATION (FNA) LINEAR;   Surgeon: Collene Gobble, MD;  Location: [place] ENDOSCOPY;  Service: Cardiopulmonary;;  . IR FLUORO GUIDE CV LINE RIGHT  07/27/2020  . IR THORACENTESIS ASP PLEURAL SPACE W/IMG GUIDE  10/24/2020  . IR THORACENTESIS ASP PLEURAL SPACE W/IMG GUIDE  10/25/2020  . IR THORACENTESIS ASP PLEURAL SPACE W/IMG GUIDE  10/31/2020  . IR US GUIDE VASC ACCESS RIGHT  07/27/2020  . ROBOT ASSISTED LAPAROSCOPIC NEPHRECTOMY Right 01/13/2017   Procedure: XI ROBOTIC ASSISTED LAPAROSCOPIC RADICAL NEPHRECTOMY WITH LYSIS OF ADHESION;  Surgeon: Alexis Frock, MD;  Location: [facility];  Service: Urology;  Laterality: Right;  Marland Kitchen VIDEO BRONCHOSCOPY N/A 11/05/2020   Procedure: VIDEO BRONCHOSCOPY WITH FLUORO;  Surgeon: Collene Gobble, MD;  Location: [facility] ENDOSCOPY;  Service: Cardiopulmonary;  Laterality: N/A;  REVIEW OF SYSTEMS:  Constitutional: positive for fatigue and weight loss Eyes: negative Ears, nose, mouth, throat, and face: negative Respiratory: positive for dyspnea on exertion and pleurisy/chest pain Cardiovascular: negative Gastrointestinal: negative Genitourinary:negative Integument/breast: negative Hematologic/lymphatic: negative Musculoskeletal:positive for muscle weakness Neurological: negative Behavioral/Psych: negative Endocrine: negative Allergic/Immunologic: negative   PHYSICAL EXAMINATION: General appearance: alert, cooperative, fatigued and no distress Head: Normocephalic, without obvious abnormality, atraumatic Neck: no adenopathy, no JVD, supple, symmetrical, trachea midline and thyroid not enlarged, symmetric, no tenderness/mass/nodules Lymph nodes: Cervical, supraclavicular, and axillary nodes normal. Resp: clear to auscultation bilaterally Back: symmetric, no curvature. ROM normal. No CVA tenderness. Cardio: regular rate and rhythm, S1, S2 normal, no murmur, click, rub or gallop GI: soft, non-tender; bowel sounds normal; no masses,  no organomegaly Extremities: extremities normal, atraumatic, no  cyanosis or edema Neurologic: Alert and oriented X 3, normal strength and tone. Normal symmetric reflexes. Normal coordination and gait  ECOG PERFORMANCE STATUS: 1 - Symptomatic but completely ambulatory  Blood pressure (!) 113/50, pulse 87, temperature (!) 97.3 F (36.3 C), temperature source Tympanic, resp. rate 16, height 5' 2"  (1.575 m), SpO2 97 %.  LABORATORY DATA: Lab Results  Component Value Date   WBC 46.0 (H) 01/09/2021   HGB 6.3 (LL) 01/09/2021   HCT 20.2 (L) 01/09/2021   MCV 96.7 01/09/2021   PLT 167 01/09/2021      Chemistry      Component Value Date/Time   NA 129 (L) 12/23/2020 1304   NA 137 02/18/2015 0000   K 5.7 (H) 12/23/2020 1304   CL 93 (L) 12/23/2020 1304   CO2 22 12/23/2020 1304   BUN 92 (H) 12/23/2020 1304   BUN 15 02/18/2015 0000   CREATININE 6.03 (H) 12/23/2020 1304   CREATININE 3.51 (HH) 12/10/2020 0808   GLU 122 02/18/2015 0000      Component Value Date/Time   CALCIUM 8.3 (L) 12/23/2020 1304   CALCIUM 8.1 (L) 08/06/2020 0059   ALKPHOS 151 (H) 12/18/2020 0539   AST 20 12/18/2020 0539   AST 23 12/10/2020 0808   ALT 22 12/18/2020 0539   ALT 25 12/10/2020 0808   BILITOT 1.4 (H) 12/18/2020 0539   BILITOT 0.6 12/10/2020 7654       RADIOGRAPHIC STUDIES: CT Head Wo Contrast  Result Date: 12/17/2020 CLINICAL DATA:  Mental status change EXAM: CT HEAD WITHOUT CONTRAST TECHNIQUE: Contiguous axial images were obtained from the base of the skull through the vertex without intravenous contrast. COMPARISON:  MRI 11/26/2020 FINDINGS: Brain: No acute interval hemorrhage. Stable ventricle size. Grossly similar edema within the left parietal white matter. Slightly dense metastatic lesions are again noted. 1.4 x 1.3 cm left parietal mass, series 4, image number 26. Small 7 mm hyperdense mass within the left frontal lobe, series 4, image number 25. Hyperdense mass within the right lateral ventricle measuring 1.6 x 2.3 cm. Approximate 14 mm left cerebellar lesion  vaguely visualized. The other metastatic lesions on MRI are better seen on MRI. No significant midline shift. Vascular: No hyperdense vessels.  Carotid vascular calcification. Skull: Normal. Negative for fracture or focal lesion. Sinuses/Orbits: No acute finding. Other: None IMPRESSION: 1. No definite acute interval change as compared with MRI from December 2021 allowing for limitations of inter modality comparison. Intracranial metastatic disease with grossly stable edema within the left parietal white matter. Only some of the metastatic lesions are appreciated on this non contrasted CT. 2. Mild chronic small vessel ischemic change of the white matter. Electronically Signed   By: Maudie Mercury  Francoise Ceo M.D.   On: 12/17/2020 19:41   MR BRAIN WO CONTRAST  Result Date: 12/17/2020 CLINICAL DATA:  Initial evaluation for acute seizure, history of metastatic lung carcinoma. EXAM: MRI HEAD WITHOUT CONTRAST TECHNIQUE: Multiplanar, multiecho pulse sequences of the brain and surrounding structures were obtained without intravenous contrast. COMPARISON:  Prior CT from earlier same day as well as recent brain MRI from 11/26/2020. FINDINGS: Brain: Again seen are multiple lesions scattered throughout the supratentorial and infratentorial brain, consistent with known intracranial metastatic disease. Overall number of these lesions appears grossly similar, although exact delineation limited due to lack of IV contrast. Associated vasogenic edema about a lesion centered at the posterior left frontoparietal region is similar to perhaps mildly increased. Additionally, there is mildly increased vasogenic edema seen about a few lesions at the left frontal lobe anteriorly (series 11, images 21, 22). Increased edema also seen about a lesion at the peripheral left cerebellum (series 11, image 7). The lesion positioned along the posterior septum pellucidum may be slightly increased in size measuring 2.6 x 1.9 cm on today's exam, previously 2.4 x  1.7 cm when measured in a similar fashion. Overall, appearance suggest mild disease progression as compared to previous. No midline shift. Associated susceptibility artifact about a lesion at the left occipital lobe is stable from previous. No other associated hemorrhage. No evidence for acute or subacute infarct. No other diffusion abnormality to suggest changes related to seizure. No other evidence for acute or chronic intracranial hemorrhage. No hydrocephalus or ventricular trapping. No extra-axial fluid collection. Pituitary gland suprasellar region within normal limits. Vascular: Major intracranial vascular flow voids are maintained. Skull and upper cervical spine: Craniocervical junction within normal limits. Bone marrow signal intensity normal. No scalp soft tissue abnormality. Sinuses/Orbits: Patient status post bilateral ocular lens replacement. No acute abnormality about the globes and orbital soft tissues. Paranasal sinuses are largely clear. No significant mastoid effusion. Other: None. IMPRESSION: 1. Probable mild disease progression as compared to previous MRI from 11/27/2019 as evidenced by mildly increased vasogenic edema about a few lesions situated at the left frontal lobe and left cerebellum as above. No midline shift or hydrocephalus. 2. No other acute intracranial abnormality. Electronically Signed   By: Jeannine Boga M.D.   On: 12/17/2020 23:52   DG Chest Port 1 View  Result Date: 12/17/2020 CLINICAL DATA:  Altered mental status, leg swelling EXAM: PORTABLE CHEST 1 VIEW COMPARISON:  November 20, 2020 FINDINGS: Bilateral layering pleural effusions with adjacent atelectasis. Probable mild pulmonary edema. No pneumothorax. Stable cardiomediastinal contours. Right tunneled line is unchanged. IMPRESSION: Bilateral layering pleural effusions with adjacent atelectasis. Probable mild pulmonary edema. Electronically Signed   By: Macy Mis M.D.   On: 12/17/2020 19:46   EEG adult  Result  Date: 12/18/2020 Lora Havens, MD     12/18/2020 10:10 AM Patient Name: Cassandra Dyer MRN: [id number] Epilepsy Attending: Lora Havens Referring Physician/Provider: Dr Neva Seat Date: 12/18/2020 Duration: 24.04 mins Patient history: 80 year old female who recently started radiation therapy for brain metastasis who presents with jerking type movement which would occur and last for a little bit before pausing for a little bit, on and off for about 5 minutes total. EEG to evaluate for seizure. Level of alertness: Awake, asleep AEDs during EEG study: LEV Technical aspects: This EEG study was done with scalp electrodes positioned according to the 10-20 International system of electrode placement. Electrical activity was acquired at a sampling rate of 500Hz  and reviewed with a high frequency filter  of 70Hz  and a low frequency filter of 1Hz . EEG data were recorded continuously and digitally stored. Description: The posterior dominant rhythm consists of 8-9 Hz activity of moderate voltage (25-35 uV) seen predominantly in posterior head regions, symmetric and reactive to eye opening and eye closing. Sleep was characterized by vertex waves, sleep spindles (12 to 14 Hz), maximal frontocentral region. EEG showed intermittent generalized and maximal left frontal 3 to 6 Hz theta-delta slowing.  Sharp waves are also noted in left frontal region. Hyperventilation and photic stimulation were not performed.   ABNORMALITY -Sharp wave, left frontal region -Intermittent slow, generalized and maximal left frontal region IMPRESSION: This study showed evidence of epileptogenicity arising from left frontal region as well as cortical dysfunction in left frontal region likely secondary to underlying structural abnormalities.  Additionally there is evidence of mild diffuse encephalopathy, nonspecific etiology.  No seizures were seen throughout the recording. Priyanka O Yadav   [place] EKG SITE RITE  Result Date: 12/19/2020 If Site  Rite image not attached, placement could not be confirmed due to current cardiac rhythm.   ASSESSMENT AND PLAN: This is a very pleasant 80 years old African-American female diagnosed with a stage IV non-small cell lung cancer, adenocarcinoma with large left lower lobe lung mass in addition to left hilar and mediastinal lymphadenopathy as well as pleural-based metastasis in the left lung diagnosed in November 2021 and multiple brain metastasis. Molecular study showed no actionable mutations and PD-L1 expression was 25%. The patient completed SRS to brain metastasis. She was supposed to start palliative systemic chemotherapy 3 weeks ago but this was delayed because of hospitalization and severe anemia as well as shortness of breath. The patient continues to feel poor today with persistent left-sided chest pain as well as the shortness of breath and significant fatigue and weakness. I do not think the patient is a great candidate to give her any systemic chemotherapy at this point. I discussed with the patient the option of palliative care and hospice referral and she is in agreement with this option. I will cancel all her chemotherapy appointments. For the anemia of chronic disease, she will receive 2 units of PRBCs transfusion today. I will see the patient on as-needed basis at this point. She was advised to call immediately if she has any concerning symptoms in the interval. The patient voices understanding of current disease status and treatment options and is in agreement with the current care plan.  All questions were answered. The patient knows to call the clinic with any problems, questions or concerns. We can certainly see the patient much sooner if necessary.  The total time spent in the appointment was 30 minutes.  Disclaimer: This note was dictated with voice recognition software. Similar sounding words can inadvertently be transcribed and may not be corrected upon review.

## 2021-01-09 NOTE — Patient Instructions (Signed)

## 2021-01-09 NOTE — Progress Notes (Signed)
At 1750 patient began to complain of pain in right arm above IV site during blood transfusion. IV had started to infiltrate.  The IV was removed and Cassandra Dyer was called to chair.  Per [name] a new IV did not need to be started and blood could be stopped.  Patient discharged in stable condition.

## 2021-01-09 NOTE — Telephone Encounter (Signed)
Pt referred to [facility].

## 2021-01-10 LAB — TYPE AND SCREEN
ABO/RH(D): O POS
Antibody Screen: NEGATIVE
Unit division: 0
Unit division: 0

## 2021-01-10 LAB — BPAM RBC
Blood Product Expiration Date: 202202232359
Blood Product Expiration Date: 202202232359
ISSUE DATE / TIME: 202201271257
ISSUE DATE / TIME: 202201271257
Unit Type and Rh: 5100
Unit Type and Rh: 5100

## 2021-01-11 ENCOUNTER — Inpatient Hospital Stay: Payer: Medicare Other

## 2021-01-12 NOTE — Progress Notes (Signed)
  Radiation Oncology         [phone] ________________________________  Name: Cassandra Dyer MRN: [id number]  Date: 12/19/2020  DOB: Jul 24, 1941  End of Treatment Note  Diagnosis:   Brain metastasis     Indication for treatment:  palliative       Radiation treatment dates:   12/05/20 - 12/19/20  Site/dose:    1.   ExacTrac, Max dose=132.6% 7 vmat beams PTV1 Lt Cerebellum 27mm 20 Gy PTV2 Lt Mid Occipital 14mm 20 Gy PTV3 Rt Temporal 51mm 20 Gy PTV4 Rt Parietal 40mm 9 Gy PTV5 Lt Parietal 32mm 20 Gy PTV6 Lt Frontal 37mm 20 Gy PTV7 Lt Frontal 58mm 20Gy PTV8 Lt Frontal 56mm 20 Gy   2.  ExacTrac, max dose=125.2% 5 vmat beams PTV4 Rt Parietal 55mm 18Gy (Total:  27 Gy in 3 fractions)  Narrative: The patient tolerated radiation treatment well.   There were no signs of acute toxicity after treatment.  Plan: The patient has completed radiation treatment. The patient will return to radiation oncology clinic for routine followup in one month. I advised the patient to call or return sooner if they have any questions or concerns related to their recovery or treatment. ________________________________  ------------------------------------------------  Jodelle Gross, MD, PhD

## 2021-01-13 ENCOUNTER — Ambulatory Visit: Payer: Medicare Other | Admitting: Radiation Oncology

## 2021-01-13 ENCOUNTER — Telehealth: Payer: Medicare Other | Admitting: Radiation Oncology

## 2021-01-13 ENCOUNTER — Ambulatory Visit
Admission: RE | Admit: 2021-01-13 | Discharge: 2021-01-13 | Disposition: A | Discharge status: Home / Self Care | Payer: Medicare Other | Source: Ambulatory Visit | Attending: Internal Medicine | Admitting: Internal Medicine

## 2021-01-13 ENCOUNTER — Other Ambulatory Visit: Payer: Self-pay

## 2021-01-13 DIAGNOSIS — C641 Malignant neoplasm of right kidney, except renal pelvis: Secondary | ICD-10-CM

## 2021-01-13 DIAGNOSIS — C3492 Malignant neoplasm of unspecified part of left bronchus or lung: Secondary | ICD-10-CM

## 2021-01-13 DIAGNOSIS — C7931 Secondary malignant neoplasm of brain: Secondary | ICD-10-CM

## 2021-01-13 NOTE — Progress Notes (Signed)
°  Radiation Oncology         [phone] ________________________________  Name: Cassandra Dyer MRN: [id number]  Date of Service: 01/13/2021  DOB: 02-11-1941  Post Treatment Telephone Note  Diagnosis:  Stage IV, [id number], NSCLC, adenocarcinoma of the LUL  Interval Since Last Radiation:  4 weeks   12/05/20 - 12/19/20: ExacTrac, Max dose=132.6% 7 vmat beams PTV1 Lt Cerebellum 87mm 20 Gy PTV2 Lt Mid Occipital 81mm 20 Gy PTV3 Rt Temporal 72mm 20 Gy PTV4 Rt Parietal 13mm 9 Gy PTV5 Lt Parietal 39mm 20 Gy PTV6 Lt Frontal 70mm 20 Gy PTV7 Lt Frontal 11mm 20Gy PTV8 Lt Frontal 46mm 20 Gy  Narrative:  The patient was contacted today for routine follow-up. During treatment she did very well with radiotherapy and did not have significant desquamation.   Impression/Plan: 1. Stage IV, [id number], NSCLC, adenocarcinoma of the LUL. I was unable to reach the patient's sister who is very actively engaged in her health care. On the message I let her know that we would plan to repeat an MRI for surveillance in about 2 months time. She was taking steroids as well but I do not see a documented steroid taper instruction sheet. I asked that she call back to clarify what Ms. Ohlsen is taking including dose and frequency. She is also in need of follow up with  Dr. Julien Nordmann in medical oncology and I will send his team a message so she can get rescheduled.     Carola Rhine, PAC

## 2021-01-14 ENCOUNTER — Other Ambulatory Visit: Payer: Self-pay

## 2021-01-14 ENCOUNTER — Inpatient Hospital Stay (HOSPITAL_COMMUNITY)
Admission: EM | Admit: 2021-01-14 | Discharge: 2021-02-11 | DRG: 951 | Disposition: E | Payer: Medicare Other | Attending: Internal Medicine | Admitting: Internal Medicine

## 2021-01-14 ENCOUNTER — Emergency Department (HOSPITAL_COMMUNITY): Payer: Medicare Other

## 2021-01-14 ENCOUNTER — Encounter (HOSPITAL_COMMUNITY): Payer: Self-pay | Admitting: Family Medicine

## 2021-01-14 DIAGNOSIS — I4581 Long QT syndrome: Secondary | ICD-10-CM | POA: Diagnosis present

## 2021-01-14 DIAGNOSIS — Z66 Do not resuscitate: Secondary | ICD-10-CM | POA: Diagnosis present

## 2021-01-14 DIAGNOSIS — Z515 Encounter for palliative care: Principal | ICD-10-CM

## 2021-01-14 DIAGNOSIS — Z85118 Personal history of other malignant neoplasm of bronchus and lung: Secondary | ICD-10-CM

## 2021-01-14 DIAGNOSIS — Z9981 Dependence on supplemental oxygen: Secondary | ICD-10-CM

## 2021-01-14 DIAGNOSIS — I120 Hypertensive chronic kidney disease with stage 5 chronic kidney disease or end stage renal disease: Secondary | ICD-10-CM | POA: Diagnosis present

## 2021-01-14 DIAGNOSIS — L89312 Pressure ulcer of right buttock, stage 2: Secondary | ICD-10-CM | POA: Diagnosis present

## 2021-01-14 DIAGNOSIS — J069 Acute upper respiratory infection, unspecified: Secondary | ICD-10-CM

## 2021-01-14 DIAGNOSIS — D72829 Elevated white blood cell count, unspecified: Secondary | ICD-10-CM | POA: Diagnosis present

## 2021-01-14 DIAGNOSIS — U071 COVID-19: Secondary | ICD-10-CM | POA: Diagnosis present

## 2021-01-14 DIAGNOSIS — R569 Unspecified convulsions: Secondary | ICD-10-CM | POA: Diagnosis not present

## 2021-01-14 DIAGNOSIS — C7931 Secondary malignant neoplasm of brain: Secondary | ICD-10-CM | POA: Diagnosis present

## 2021-01-14 DIAGNOSIS — C7951 Secondary malignant neoplasm of bone: Secondary | ICD-10-CM | POA: Diagnosis present

## 2021-01-14 DIAGNOSIS — D696 Thrombocytopenia, unspecified: Secondary | ICD-10-CM | POA: Diagnosis present

## 2021-01-14 DIAGNOSIS — C3490 Malignant neoplasm of unspecified part of unspecified bronchus or lung: Secondary | ICD-10-CM

## 2021-01-14 DIAGNOSIS — E785 Hyperlipidemia, unspecified: Secondary | ICD-10-CM | POA: Diagnosis present

## 2021-01-14 DIAGNOSIS — J9621 Acute and chronic respiratory failure with hypoxia: Secondary | ICD-10-CM | POA: Diagnosis not present

## 2021-01-14 DIAGNOSIS — C3492 Malignant neoplasm of unspecified part of left bronchus or lung: Secondary | ICD-10-CM | POA: Diagnosis present

## 2021-01-14 DIAGNOSIS — Z8249 Family history of ischemic heart disease and other diseases of the circulatory system: Secondary | ICD-10-CM

## 2021-01-14 DIAGNOSIS — Z803 Family history of malignant neoplasm of breast: Secondary | ICD-10-CM

## 2021-01-14 DIAGNOSIS — Z923 Personal history of irradiation: Secondary | ICD-10-CM | POA: Diagnosis not present

## 2021-01-14 DIAGNOSIS — J90 Pleural effusion, not elsewhere classified: Secondary | ICD-10-CM | POA: Diagnosis present

## 2021-01-14 DIAGNOSIS — J910 Malignant pleural effusion: Secondary | ICD-10-CM | POA: Diagnosis present

## 2021-01-14 DIAGNOSIS — C3432 Malignant neoplasm of lower lobe, left bronchus or lung: Secondary | ICD-10-CM | POA: Diagnosis present

## 2021-01-14 DIAGNOSIS — C782 Secondary malignant neoplasm of pleura: Secondary | ICD-10-CM | POA: Diagnosis present

## 2021-01-14 DIAGNOSIS — Z87891 Personal history of nicotine dependence: Secondary | ICD-10-CM

## 2021-01-14 DIAGNOSIS — Z85528 Personal history of other malignant neoplasm of kidney: Secondary | ICD-10-CM

## 2021-01-14 DIAGNOSIS — L89322 Pressure ulcer of left buttock, stage 2: Secondary | ICD-10-CM | POA: Diagnosis present

## 2021-01-14 DIAGNOSIS — Z992 Dependence on renal dialysis: Secondary | ICD-10-CM

## 2021-01-14 DIAGNOSIS — Z7952 Long term (current) use of systemic steroids: Secondary | ICD-10-CM

## 2021-01-14 DIAGNOSIS — Z96641 Presence of right artificial hip joint: Secondary | ICD-10-CM | POA: Diagnosis present

## 2021-01-14 DIAGNOSIS — J9620 Acute and chronic respiratory failure, unspecified whether with hypoxia or hypercapnia: Secondary | ICD-10-CM | POA: Diagnosis present

## 2021-01-14 DIAGNOSIS — N186 End stage renal disease: Secondary | ICD-10-CM | POA: Diagnosis not present

## 2021-01-14 DIAGNOSIS — J449 Chronic obstructive pulmonary disease, unspecified: Secondary | ICD-10-CM | POA: Diagnosis present

## 2021-01-14 DIAGNOSIS — G4089 Other seizures: Secondary | ICD-10-CM | POA: Diagnosis present

## 2021-01-14 DIAGNOSIS — Z800 Family history of malignant neoplasm of digestive organs: Secondary | ICD-10-CM

## 2021-01-14 DIAGNOSIS — Z79899 Other long term (current) drug therapy: Secondary | ICD-10-CM

## 2021-01-14 LAB — CBC
HCT: 26.2 % — ABNORMAL LOW (ref 36.0–46.0)
Hemoglobin: 8.1 g/dL — ABNORMAL LOW (ref 12.0–15.0)
MCH: 29.8 pg (ref 26.0–34.0)
MCHC: 30.9 g/dL (ref 30.0–36.0)
MCV: 96.3 fL (ref 80.0–100.0)
Platelets: 126 10*3/uL — ABNORMAL LOW (ref 150–400)
RBC: 2.72 MIL/uL — ABNORMAL LOW (ref 3.87–5.11)
RDW: 16 % — ABNORMAL HIGH (ref 11.5–15.5)
WBC: 50.8 10*3/uL (ref 4.0–10.5)
nRBC: 0 % (ref 0.0–0.2)

## 2021-01-14 LAB — I-STAT VENOUS BLOOD GAS, ED
Acid-Base Excess: 10 mmol/L — ABNORMAL HIGH (ref 0.0–2.0)
Bicarbonate: 36 mmol/L — ABNORMAL HIGH (ref 20.0–28.0)
Calcium, Ion: 1.03 mmol/L — ABNORMAL LOW (ref 1.15–1.40)
HCT: 23 % — ABNORMAL LOW (ref 36.0–46.0)
Hemoglobin: 7.8 g/dL — ABNORMAL LOW (ref 12.0–15.0)
O2 Saturation: 99 %
Potassium: 3.9 mmol/L (ref 3.5–5.1)
Sodium: 135 mmol/L (ref 135–145)
TCO2: 38 mmol/L — ABNORMAL HIGH (ref 22–32)
pCO2, Ven: 57.3 mmHg (ref 44.0–60.0)
pH, Ven: 7.405 (ref 7.250–7.430)
pO2, Ven: 135 mmHg — ABNORMAL HIGH (ref 32.0–45.0)

## 2021-01-14 LAB — COMPREHENSIVE METABOLIC PANEL
ALT: 30 U/L (ref 0–44)
AST: 37 U/L (ref 15–41)
Albumin: 1.5 g/dL — ABNORMAL LOW (ref 3.5–5.0)
Alkaline Phosphatase: 232 U/L — ABNORMAL HIGH (ref 38–126)
Anion gap: 16 — ABNORMAL HIGH (ref 5–15)
BUN: 21 mg/dL (ref 8–23)
CO2: 27 mmol/L (ref 22–32)
Calcium: 7.7 mg/dL — ABNORMAL LOW (ref 8.9–10.3)
Chloride: 93 mmol/L — ABNORMAL LOW (ref 98–111)
Creatinine, Ser: 1.95 mg/dL — ABNORMAL HIGH (ref 0.44–1.00)
GFR, Estimated: 26 mL/min — ABNORMAL LOW (ref >60)
Glucose, Bld: 165 mg/dL — ABNORMAL HIGH (ref 70–99)
Potassium: 4 mmol/L (ref 3.5–5.1)
Sodium: 136 mmol/L (ref 135–145)
Total Bilirubin: 0.9 mg/dL (ref 0.3–1.2)
Total Protein: 4.8 g/dL — ABNORMAL LOW (ref 6.5–8.1)

## 2021-01-14 LAB — SARS CORONAVIRUS 2 BY RT PCR (HOSPITAL ORDER, PERFORMED IN ~~LOC~~ HOSPITAL LAB): SARS Coronavirus 2: POSITIVE — AB

## 2021-01-14 LAB — TROPONIN I (HIGH SENSITIVITY): Troponin I (High Sensitivity): 181 ng/L (ref <18)

## 2021-01-14 LAB — APTT: aPTT: 32 seconds (ref 24–36)

## 2021-01-14 LAB — PROTIME-INR
INR: 1.2 (ref 0.8–1.2)
Prothrombin Time: 14.6 seconds (ref 11.4–15.2)

## 2021-01-14 LAB — LACTIC ACID, PLASMA: Lactic Acid, Venous: 3.4 mmol/L (ref 0.5–1.9)

## 2021-01-14 LAB — BRAIN NATRIURETIC PEPTIDE: B Natriuretic Peptide: 1020.3 pg/mL — ABNORMAL HIGH (ref 0.0–100.0)

## 2021-01-14 MED ORDER — POLYVINYL ALCOHOL 1.4 % OP SOLN
1.0000 [drp] | Freq: Four times a day (QID) | OPHTHALMIC | Status: DC | PRN
Start: 2021-01-14 — End: 2021-01-16

## 2021-01-14 MED ORDER — SENNOSIDES-DOCUSATE SODIUM 8.6-50 MG PO TABS: 1.0000 | ORAL_TABLET | Freq: Every evening | ORAL | Status: DC | PRN

## 2021-01-14 MED ORDER — LORAZEPAM 1 MG PO TABS: 1.0000 mg | ORAL_TABLET | ORAL | Status: DC | PRN

## 2021-01-14 MED ORDER — IPRATROPIUM-ALBUTEROL 20-100 MCG/ACT IN AERS
1.0000 | INHALATION_SPRAY | Freq: Four times a day (QID) | RESPIRATORY_TRACT | Status: DC | PRN
  Filled 2021-01-14: qty 4

## 2021-01-14 MED ORDER — DEXAMETHASONE 4 MG PO TABS
4.0000 mg | ORAL_TABLET | Freq: Three times a day (TID) | ORAL | Status: DC
  Administered 2021-01-14 – 2021-01-15 (×6): 4 mg via ORAL
  Filled 2021-01-14 (×9): qty 1

## 2021-01-14 MED ORDER — LORAZEPAM 2 MG/ML PO CONC: 1.0000 mg | ORAL | Status: DC | PRN

## 2021-01-14 MED ORDER — SODIUM CHLORIDE 0.9 % IV SOLN: 250.0000 mL | INTRAVENOUS | Status: DC | PRN

## 2021-01-14 MED ORDER — ONDANSETRON HCL 4 MG/2ML IJ SOLN: 4.0000 mg | Freq: Four times a day (QID) | INTRAMUSCULAR | Status: DC | PRN

## 2021-01-14 MED ORDER — TEMAZEPAM 7.5 MG PO CAPS
7.5000 mg | ORAL_CAPSULE | Freq: Every evening | ORAL | Status: DC | PRN
  Administered 2021-01-15: 7.5 mg via ORAL
  Filled 2021-01-14: qty 1

## 2021-01-14 MED ORDER — LORAZEPAM 2 MG/ML IJ SOLN: 1.0000 mg | INTRAMUSCULAR | Status: DC | PRN

## 2021-01-14 MED ORDER — ONDANSETRON 4 MG PO TBDP: 4.0000 mg | ORAL_TABLET | Freq: Four times a day (QID) | ORAL | Status: DC | PRN

## 2021-01-14 MED ORDER — FENTANYL CITRATE (PF) 100 MCG/2ML IJ SOLN
12.5000 ug | INTRAMUSCULAR | Status: DC | PRN
  Administered 2021-01-15 – 2021-01-16 (×2): 12.5 ug via INTRAVENOUS
  Filled 2021-01-14 (×2): qty 2

## 2021-01-14 MED ORDER — LEVETIRACETAM ER 500 MG PO TB24
500.0000 mg | ORAL_TABLET | Freq: Every day | ORAL | Status: DC
  Filled 2021-01-14: qty 1

## 2021-01-14 MED ORDER — PANTOPRAZOLE SODIUM 40 MG PO TBEC
40.0000 mg | DELAYED_RELEASE_TABLET | Freq: Every day | ORAL | Status: DC
Start: 2021-01-14 — End: 2021-01-16
  Administered 2021-01-14 – 2021-01-15 (×2): 40 mg via ORAL
  Filled 2021-01-14 (×3): qty 1

## 2021-01-14 MED ORDER — BISACODYL 5 MG PO TBEC: 5.0000 mg | DELAYED_RELEASE_TABLET | Freq: Every day | ORAL | Status: DC | PRN

## 2021-01-14 MED ORDER — ACETAMINOPHEN 325 MG PO TABS: 650.0000 mg | ORAL_TABLET | Freq: Four times a day (QID) | ORAL | Status: DC | PRN

## 2021-01-14 MED ORDER — ACETAMINOPHEN 650 MG RE SUPP: 650.0000 mg | Freq: Four times a day (QID) | RECTAL | Status: DC | PRN

## 2021-01-14 NOTE — Progress Notes (Signed)
Care started prior to midnight in the emergency room and patient is admitted early this morning after midnight by Dr. Christia Reading (I am in current agreement with his assessment plan.  Additional changes to the plan of care been made accordingly.  The patient is a 80 year old Caucasian female with a past medical history significant for but not limited to COPD with chronic hypoxic respiratory failure, history of remote renal cell carcinoma, hypertension, ESRD on hemodialysis, stage IV non-small cell lung cancer with brain metastasis as well as other comorbidities who presented to the emergency room for evaluation of worsening shortness of breath and hypoxia.  She has been requiring 4 L supplemental oxygen via nasal cast chronically however became increasingly short of breath over last 4 days with significant desaturations.  EMS found her to be saturating in the 70s on her 4 L of supplemental oxygen and was very dyspneic at rest and while she was transitioned to a nonrebreather saturations dropped to 50%.  She had been plan for chemotherapy with this was delayed by her recurrent hospitalizations.  She is hospitalized back in November 2021 with recurrent pleural effusion underwent multiple thoracentesis.  Lung cancer was diagnosed at that time and chemotherapy was delayed by her recurrent hospitalization.  Then in December she had resection of her right femoral head and neck metastatic lesion.  She is started on Decadron for brain metastasis and underwent radiation therapy.  She was then hospitalized early again in January 2022 with seizures.  She follow-up with her primary oncologist 01/09/2021 and she is not felt to be a great candidate for any systemic chemotherapy and the patient opted for hospice referral and palliative approach.  Her chemotherapy appointments were then canceled.  Upon presentation to the ED she is found to be afebrile with saturation in the mid 90s and was on 15 L supplemental oxygen via nasal  cannula and mildly tachypneic.  Chest x-ray demonstrated a left hemithorax opacification due to large pleural effusion.  The ED physician discussed the chest x-ray findings with the patient and her sister in the indicated that they were not interested in further interventions until he wanted to focus on comfort.  The patient's sister supported this decision and she is not interested in undergoing dialysis anymore and will continue on oxygen via nonrebreather for comfort.  She was admitted for a left pleural effusion with acute on chronic hypoxic respiratory failure and then incidentally found to be Covid positive.  We will not treat this patient's Covid disease now given that she is transitioning to comfort measures.  Hospice referral has been placed as we have consulted case management but she remains on airborne and contact precautions given her Covid positivity.  We will continue supplemental oxygen, fentanyl for dyspnea and pain.  Patient is non-small cell lung cancer and brain metastasis were diagnosed in November 2021 and she had a very large left lower lobe mass with left hilar and mediastinal lymphadenopathy as well as pleural metastases and multiple brain metastases.  Her chemotherapy appointments are canceled last week when she elected for hospice referral and will continue Decadron for now.  Have a history of seizure disorder and for comfortable continue her Keppra  She also has ESRD but will not continue with hemodialysis which may hasten her expected death given her prognosis being very grim and poor  Patient has been admitted for terminal care and hospice referral has been made.  Once hospice bed is offered if she is stable at that time we will transfer  to residential hospice for completion of her terminal care.  He is a very high risk of further decompensation worsening given now that she is Covid positive and will not be getting any treatment for Covid disease.  Will not pursue any more  blood work either given that we will transition the patient to comfort care she is at very high risk of inpatient decompensation, worsening and possible in-hospital death if she does not get transfer to residential hospice soon.

## 2021-01-14 NOTE — TOC Initial Note (Addendum)
Transition of Care [facility]) - Initial/Assessment Note    Patient Details  Name: Cassandra Dyer MRN: [id number] Date of Birth: 1941/06/22  [facility] CM/SW Contact:    Benard Halsted, [place] Phone Number: 01/25/2021, 3:38 PM  Clinical Narrative:                 Per [place], patient first tested COVID + on 01/02/21 and completed 10 days of isolation there. CSW alerted [facility] of that date; [facility] will check and let CSW know, though they do not have beds available at this time. CSW spoke with patient. She reported that if [facility] is unable to accept her, she is agreeable to [facility] [place] referral. She provided permission for CSW to update her friend, Eddie Dibbles, which [place] did. Eddie Dibbles reported being hopeful for [employer]. CSW will follow up.   Expected Discharge Plan: [place] Barriers to Discharge: Hospice Bed not available   Patient Goals and CMS Choice Patient states their goals for this hospitalization and ongoing recovery are:: Comfort CMS Medicare.gov Compare Post Acute Care list provided to:: Patient Choice offered to / list presented to : Patient  Expected Discharge Plan and Services Expected Discharge Plan: [place] [address] In-house Referral: Clinical Social Work,Hospice / [place] Acute Care Choice: Hospice Living arrangements for the past 2 months: [place]                                      Prior Living Arrangements/Services Living arrangements for the past 2 months: [place] Lives with:: Self Patient language and need for interpreter reviewed:: Yes Do you feel safe going back to the place where you live?: Yes      Need for Family Participation in Patient Care: Yes (Comment) Care giver support system in place?: Yes (comment)   Criminal Activity/Legal Involvement Pertinent to Current Situation/Hospitalization: No - Comment as needed  Activities of Daily  Living Home Assistive Devices/Equipment: None ADL Screening (condition at time of admission) Patient's cognitive ability adequate to safely complete daily activities?: Yes Is the patient deaf or have difficulty hearing?: No Does the patient have difficulty seeing, even when wearing glasses/contacts?: No Does the patient have difficulty concentrating, remembering, or making decisions?: No Patient able to express need for assistance with ADLs?: Yes Does the patient have difficulty dressing or bathing?: Yes Independently performs ADLs?: No Communication: Independent Dressing (OT): Needs assistance Is this a change from baseline?: Change from baseline, expected to last >3 days Grooming: Needs assistance Is this a change from baseline?: Change from baseline, expected to last >3 days Feeding: Independent Bathing: Needs assistance Is this a change from baseline?: Change from baseline, expected to last >3 days Toileting: Needs assistance Is this a change from baseline?: Change from baseline, expected to last >3days In/Out Bed: Needs assistance Is this a change from baseline?: Change from baseline, expected to last >3 days Walks in Home: Needs assistance Is this a change from baseline?: Change from baseline, expected to last >3 days Does the patient have difficulty walking or climbing stairs?: No Weakness of Legs: Both Weakness of Arms/Hands: None  Permission Sought/Granted Permission sought to share information with : Facility Heritage manager Permission granted to share information with : Yes, Verbal Permission Granted  Share Information with NAME: Paul/Shirley  Permission granted to share info w AGENCY: Hospice  Permission  granted to share info w Relationship: Friend/Sister     Emotional Assessment   Attitude/Demeanor/Rapport: Engaged,Gracious Affect (typically observed): Accepting,Appropriate Orientation: : Oriented to Self,Oriented to Place,Oriented to   Time,Oriented to Situation Alcohol / Substance Use: Not Applicable Psych Involvement: No (comment)  Admission diagnosis:  Pleural effusion [J90] Acute on chronic respiratory failure (HCC) [J96.20] Acute on chronic respiratory failure with hypoxia (HCC) [J96.21] Malignant neoplasm of lung, unspecified laterality, unspecified part of lung ([place]) [C34.90] Patient Active Problem List   Diagnosis Date Noted  . Acute on chronic respiratory failure with hypoxia ([place]) 01/30/2021  . Pleural effusion, left 01/23/2021  . Acute on chronic respiratory failure ([place]) 02/09/2021  . Acute respiratory disease due to COVID-19 virus 01/23/2021  . Terminal care 01/15/2021  . End of life care 02/10/2021  . Pressure injury of skin 12/19/2020  . Seizure ([place]) 12/18/2020  . Seizures ([place]) 12/17/2020  . Hyponatremia 12/17/2020  . Brain metastases ([place]) 12/11/2020  . Anemia in chronic kidney disease 12/05/2020  . Encounter for antineoplastic immunotherapy 12/05/2020  . Closed displaced fracture of right femoral neck ([place]) 11/22/2020  . Noncompliance of patient with renal dialysis ([place]) 11/20/2020  . Adenocarcinoma of left lung, stage 4 ([place]) 11/16/2020  . Encounter for antineoplastic chemotherapy 11/16/2020  . Goals of care, counseling/discussion 11/16/2020  . Mass of upper lobe of left lung 11/05/2020  . Mediastinal adenopathy 11/05/2020  . Pulmonary nodules 11/05/2020  . Acute respiratory failure with hypoxia ([place]) 10/23/2020  . Pulmonary edema 10/22/2020  . Acute pulmonary edema ([place]) 10/22/2020  . Educated about COVID-19 virus infection 10/10/2020  . Chronic hypoxemic respiratory failure ([place]) 09/26/2020  . COPD (chronic obstructive pulmonary disease) ([place]) 09/26/2020  . Nausea vomiting and diarrhea 09/10/2020  . ESRD (end stage renal disease) ([place])   . Volume overload   . Cough 08/04/2020  . Thrombocytopenia ([place]) 08/02/2020  . Anemia 08/02/2020  . Hyperkalemia 07/27/2020  . Hyperlipidemia  07/27/2020  . Metabolic acidosis 03/55/9741  . CKD (chronic kidney disease) 05/23/2020  . Solitary kidney, acquired 05/23/2020  . Fever 04/26/2020  . Acute hypoxemic respiratory failure ([place]) 04/19/2020  . Diarrhea 04/19/2020  . Nausea & vomiting 04/19/2020  . Pneumonia 04/09/2020  . Acute kidney injury (AKI) with acute tubular necrosis (ATN) ([place]) 04/09/2020  . Hypertensive urgency 04/09/2020  . History of hepatitis C 04/09/2020  . Sepsis ([place]) 04/08/2020  . Venous insufficiency 12/28/2019  . Hyperglycemia 06/29/2019  . Osteoporosis 02/20/2018  . Essential hypertension 02/15/2017  . Dyslipidemia 02/15/2017  . Renal cell carcinoma of right kidney ([place]) 02/15/2017   PCP:  Biagio Borg, MD Pharmacy:   [place] ([place]), [place] - [address] [place] [address] Phone: [phone] Fax: [place], [place] [address] [place] [address] Phone: [phone] Fax: [phone]     Social Determinants of Health (SDOH) Interventions    Readmission Risk Interventions Readmission Risk Prevention Plan 01/29/2021 08/08/2020  Transportation Screening Complete Complete  Medication Review (RN Care Manager) Referral to Pharmacy Complete  PCP or Specialist appointment within 3-5 days of discharge Complete Complete  HRI or Home Care Consult Complete Complete  SW Recovery Care/Counseling Consult Complete Complete  Palliative Care Screening Not Applicable Not [place] Complete Not Applicable  Some recent data might be hidden

## 2021-01-14 NOTE — ED Provider Notes (Signed)
[facility] Emergency Department Provider Note MRN:  [id number]  Arrival date & time: 01/17/2021     Chief Complaint   Hypoxia History of Present Illness   Cassandra Dyer is a 80 y.o. year-old female with a history of metastatic lung cancer presenting to the ED with chief complaint of hypoxia.  EMS called to care facility for low oxygen numbers.  Despite her normal 4 L of nasal cannula oxygen she was saturating in the 70s.  She was being transitioned to EMS oxygen supplementation and on room air she dropped to the 50s.  She denies any pain.  Review of Systems  A complete 10 system review of systems was obtained and all systems are negative except as noted in the HPI and PMH.   Patient's Health History    Past Medical History:  Diagnosis Date  . Anemia   . ESRD on hemodialysis ([place])   . Hepatitis    hx of hep C - 10-20 years ago   . History of blood transfusion   . History of kidney cancer   . Hyperlipidemia   . Hypertension    not taken b/p med in 2-3 years md deceased never went to another md   . Renal cell carcinoma ([place]) 2018  . Stage 4 lung cancer ([place])    new - due to start therapy with next appt on 12/14    Past Surgical History:  Procedure Laterality Date  . ANTERIOR APPROACH HEMI HIP ARTHROPLASTY Right 11/22/2020   Procedure: ANTERIOR APPROACH RIGHT HIP HEMIARTHROPLASTY;  Surgeon: Leandrew Koyanagi, MD;  Location: [facility];  Service: Orthopedics;  Laterality: Right;  Needs RNFA  . AV FISTULA PLACEMENT Left 08/07/2020   Procedure: LEFT ARM ARTERIOVENOUS (AV) FISTULA CREATION;  Surgeon: [name], MD;  Location: [facility];  Service: Vascular;  Laterality: Left;  . BRONCHIAL BIOPSY  11/05/2020   Procedure: BRONCHIAL BIOPSIES;  Surgeon: Collene Gobble, MD;  Location: [facility] ENDOSCOPY;  Service: Cardiopulmonary;;  . BRONCHIAL BRUSHINGS  11/05/2020   Procedure: BRONCHIAL BRUSHINGS;  Surgeon: Collene Gobble, MD;  Location: [facility];  Service:  Cardiopulmonary;;  . BRONCHIAL NEEDLE ASPIRATION BIOPSY  11/05/2020   Procedure: BRONCHIAL NEEDLE ASPIRATION BIOPSIES;  Surgeon: Collene Gobble, MD;  Location: [facility];  Service: Cardiopulmonary;;  . CATARACT EXTRACTION Bilateral    11/30 left /12/9 right  . ELECTROMAGNETIC NAVIGATION BROCHOSCOPY N/A 11/05/2020   Procedure: ELECTROMAGNETIC NAVIGATION BRONCHOSCOPY;  Surgeon: Collene Gobble, MD;  Location: [place];  Service: Cardiopulmonary;  Laterality: N/A;  . ENDOBRONCHIAL ULTRASOUND N/A 11/05/2020   Procedure: ENDOBRONCHIAL ULTRASOUND;  Surgeon: Collene Gobble, MD;  Location: [facility] ENDOSCOPY;  Service: Cardiopulmonary;  Laterality: N/A;  . FINE NEEDLE ASPIRATION  11/05/2020   Procedure: FINE NEEDLE ASPIRATION (FNA) LINEAR;  Surgeon: Collene Gobble, MD;  Location: [facility] ENDOSCOPY;  Service: Cardiopulmonary;;  . IR FLUORO GUIDE CV LINE RIGHT  07/27/2020  . IR THORACENTESIS ASP PLEURAL SPACE W/IMG GUIDE  10/24/2020  . IR THORACENTESIS ASP PLEURAL SPACE W/IMG GUIDE  10/25/2020  . IR THORACENTESIS ASP PLEURAL SPACE W/IMG GUIDE  10/31/2020  . IR US GUIDE VASC ACCESS RIGHT  07/27/2020  . ROBOT ASSISTED LAPAROSCOPIC NEPHRECTOMY Right 01/13/2017   Procedure: XI ROBOTIC ASSISTED LAPAROSCOPIC RADICAL NEPHRECTOMY WITH LYSIS OF ADHESION;  Surgeon: Alexis Frock, MD;  Location: [facility];  Service: Urology;  Laterality: Right;  [place] Kitchen VIDEO BRONCHOSCOPY N/A 11/05/2020   Procedure: VIDEO BRONCHOSCOPY WITH FLUORO;  Surgeon: Collene Gobble, MD;  Location: [facility] ENDOSCOPY;  Service: Cardiopulmonary;  Laterality: N/A;    Family History  Problem Relation Age of Onset  . Hypertension Mother   . Pancreatic cancer Father   . Breast cancer Sister     Social History   Socioeconomic History  . Marital status: Significant Other    Spouse name: Not on file  . Number of children: 0  . Years of education: 9  . Highest education level: Not on file  Occupational History  . Occupation: Retired  Tobacco Use  .  Smoking status: Former Smoker    Packs/day: 0.50    Years: 30.00    Pack years: 15.00    Types: Cigarettes    Quit date: 12/14/2000    Years since quitting: 20.0  . Smokeless tobacco: Never Used  Vaping Use  . Vaping Use: Never used  Substance and Sexual Activity  . Alcohol use: Yes    Comment: occasional wine   . Drug use: No  . Sexual activity: Not Currently  Other Topics Concern  . Not on file  Social History Narrative   Fun/Hobby: Watching TV and crossword puzzles   Social Determinants of Health   Financial Resource Strain: Low Risk   . Difficulty of Paying Living Expenses: Not hard at all  Food Insecurity: No Food Insecurity  . Worried About Charity fundraiser in the Last Year: Never true  . Ran Out of Food in the Last Year: Never true  Transportation Needs: No Transportation Needs  . Lack of Transportation (Medical): No  . Lack of Transportation (Non-Medical): No  Physical Activity: Inactive  . Days of Exercise per Week: 0 days  . Minutes of Exercise per Session: 0 min  Stress: No Stress Concern Present  . Feeling of Stress : Not at all  Social Connections: Moderately Integrated  . Frequency of Communication with Friends and Family: More than three times a week  . Frequency of Social Gatherings with Friends and Family: Once a week  . Attends Religious Services: More than 4 times per year  . Active Member of Clubs or Organizations: Yes  . Attends Archivist Meetings: More than 4 times per year  . Marital Status: Widowed  Intimate Partner Violence: Not on file     Physical Exam   Vitals:   02/06/2021 0315 02/03/2021 0345  BP: 111/64 (!) 115/54  Pulse: (!) 104 100  Resp: (!) 21 (!) 21  Temp:    SpO2: 100% 94%    CONSTITUTIONAL: Well-appearing, NAD NEURO:  Alert and oriented x 3, no focal deficits EYES:  eyes equal and reactive ENT/NECK:  no LAD, no JVD CARDIO: Regular rate, well-perfused, normal S1 and S2 PULM:  CTAB no wheezing or rhonchi GI/GU:   normal bowel sounds, non-distended, non-tender MSK/SPINE:  No gross deformities, no edema SKIN:  no rash, atraumatic PSYCH:  Appropriate speech and behavior  *Additional and/or pertinent findings included in MDM below  Diagnostic and Interventional Summary    EKG Interpretation  Date/Time:  January 14, 2021 at 00: 34: 59 Ventricular Rate:  109 PR Interval:    QRS Duration: 67 QT Interval:  384 QTC Calculation: 518 R Axis:     Text Interpretation: Sinus tachycardia Confirmed by Dr. Gerlene Fee at 4:06 AM      Labs Reviewed  CULTURE, BLOOD (SINGLE)  SARS CORONAVIRUS 2 BY RT PCR (HOSPITAL ORDER, [facility])  CBC  COMPREHENSIVE METABOLIC PANEL  LACTIC ACID, PLASMA  URINALYSIS, ROUTINE  W REFLEX MICROSCOPIC  PROTIME-INR  APTT  BRAIN NATRIURETIC PEPTIDE  TROPONIN I (HIGH SENSITIVITY)  TROPONIN I (HIGH SENSITIVITY)    DG Chest Port 1 View  Final Result      Medications - No data to display   Procedures  /  Critical Care .Critical Care Performed by: Maudie Flakes, MD Authorized by: Maudie Flakes, MD   Critical care provider statement:    Critical care time (minutes):  45   Critical care was necessary to treat or prevent imminent or life-threatening deterioration of the following conditions:  Respiratory failure   Critical care was time spent personally by me on the following activities:  Discussions with consultants, evaluation of patient's response to treatment, examination of patient, ordering and performing treatments and interventions, ordering and review of laboratory studies, ordering and review of radiographic studies, pulse oximetry, re-evaluation of patient's condition, obtaining history from patient or surrogate and review of old charts    ED Course and Medical Decision Making  I have reviewed the triage vital signs, the nursing notes, and pertinent available records from the EMR.  Listed above are laboratory and imaging tests  that I personally ordered, reviewed, and interpreted and then considered in my medical decision making (see below for details).  Metastatic lung cancer here with hypoxic respiratory failure.  Considering worsening of metastatic disease, pneumonia, PE, COVID-19.  Discussed goals of care with patient's sister.  We have agreed on a DNR/DNI status, we will obtain further diagnostic testing to determine what we are up against and then we will have a repeat discussion regarding comfort care measures.     Chest x-ray reveals full opacity of the left lung likely due to pleural effusion.  Suspect related to patient's underlying lung cancer.  Patient is awake, alert, oriented fully, capable of making her own decisions at this time.  We discussed management options of this pleural effusion.  She is currently not interested in procedural intervention, she is more interested in comfort measures.  She has been fighting this cancer for a long time and she is "ready to go".  I relayed this conversation to her sister, who feels this is in line with patient's recent emotions regarding end-of-life care.  Plan is to transition to comfort care, admit to the hospital.  Barth Kirks. [place] Small, [place] [email or web address]  Final Clinical Impressions(s) / ED Diagnoses     ICD-10-CM   1. Malignant neoplasm of lung, unspecified laterality, unspecified part of lung ([place])  C34.90   2. Pleural effusion  J90     ED Discharge Orders    None       Discharge Instructions Discussed with and Provided to Patient:   Discharge Instructions   None       Maudie Flakes, MD 02/02/2021 [phone]

## 2021-01-14 NOTE — ED Notes (Signed)
Breakfast order placed ?

## 2021-01-14 NOTE — ED Notes (Signed)
Attempted report x 2 

## 2021-01-14 NOTE — H&P (Signed)
History and Physical    Cassandra Dyer NTZ:[id number] DOB: 01-Jul-1941 DOA: 02/07/2021  PCP: Biagio Borg, MD   Patient coming from: SNF   Chief Complaint: SOB, hypoxia   HPI: Cassandra Dyer is a 80 y.o. female with medical history significant for COPD with chronic hypoxic respiratory failure, remote renal cell carcinoma, hypertension, ESRD on hemodialysis, and stage IV non-small cell lung cancer with brain metastases, now presenting to emergency department for evaluation of shortness of breath and hypoxia.  She had been requiring 4 L/min of supplemental oxygen chronically but was becoming increasingly short of breath over the past day with significant desaturations.  EMS found the patient to be saturating in the 70s on her usual 4 L/min and was dyspneic at rest.  While transitioning to nonrebreather, saturations dropped to 50%.  She denies any chest pain, fevers, or change in her chronic cough.  She had been planned for chemotherapy but this was delayed by recurrent hospitalizations.  She was hospitalized in November with recurrent pleural effusions and and underwent multiple thoracenteses.  Lung cancer was diagnosed in November 2021 and chemotherapy was delayed by recurrent hospitalization.  She had resection of her right femoral head and neck metastatic lesion in December.  She was started on Decadron for her brain metastases and underwent radiation therapy.  She was hospitalized again in early January 2022 with seizures.  She followed up with her oncologist on 01/09/2021, she was not felt to be a great candidate for any systemic chemotherapy and the patient opted for hospice referral and palliative care approach; her chemotherapy appointments were canceled.  ED Course: Upon arrival to the ED, patient is found to be afebrile, saturating mid to upper 90s on 15 L/min supplemental oxygen, mildly tachypneic and tachycardic, and with stable blood pressure.  EKG features sinus tachycardia with rate 109  and prolonged QT interval.  Chest x-ray demonstrates left hemithorax opacification due to large pleural effusion.  Chemistry panel notable for alkaline phosphatase 232 and albumin 1.5.  CBC features a leukocytosis to 50,800, hemoglobin 8.1, and mild thrombocytopenia.  Lactic acid is elevated to 3.4 and troponin is 191.  ED physician discussed the chest x-ray with the patient and her sister, and the patient indicated that she was not interested in any further interventions and wanted to focus solely on comfort.  Her sister supported this decision.  Patient is alert and fully oriented in the ED and confirms these wishes on admission, adding that she is not interested in undergoing dialysis anymore either, understanding that this will hasten her passing.  She wants to continue oxygen via NRB for comfort.   Review of Systems:  All other systems reviewed and apart from HPI, are negative.  Past Medical History:  Diagnosis Date  . Anemia   . ESRD on hemodialysis ([place])   . Hepatitis    hx of hep C - 10-20 years ago   . History of blood transfusion   . History of kidney cancer   . Hyperlipidemia   . Hypertension    not taken b/p med in 2-3 years md deceased never went to another md   . Renal cell carcinoma ([place]) 2018  . Stage 4 lung cancer ([place])    new - due to start therapy with next appt on 12/14    Past Surgical History:  Procedure Laterality Date  . ANTERIOR APPROACH HEMI HIP ARTHROPLASTY Right 11/22/2020   Procedure: ANTERIOR APPROACH RIGHT HIP HEMIARTHROPLASTY;  Surgeon: Leandrew Koyanagi, MD;  Location: [place];  Service: Orthopedics;  Laterality: Right;  Needs RNFA  . AV FISTULA PLACEMENT Left 08/07/2020   Procedure: LEFT ARM ARTERIOVENOUS (AV) FISTULA CREATION;  Surgeon: [name], MD;  Location: [place];  Service: Vascular;  Laterality: Left;  . BRONCHIAL BIOPSY  11/05/2020   Procedure: BRONCHIAL BIOPSIES;  Surgeon: Collene Gobble, MD;  Location: [facility] ENDOSCOPY;  Service: Cardiopulmonary;;  .  BRONCHIAL BRUSHINGS  11/05/2020   Procedure: BRONCHIAL BRUSHINGS;  Surgeon: Collene Gobble, MD;  Location: [place];  Service: Cardiopulmonary;;  . BRONCHIAL NEEDLE ASPIRATION BIOPSY  11/05/2020   Procedure: BRONCHIAL NEEDLE ASPIRATION BIOPSIES;  Surgeon: Collene Gobble, MD;  Location: [place];  Service: Cardiopulmonary;;  . CATARACT EXTRACTION Bilateral    11/30 left /12/9 right  . ELECTROMAGNETIC NAVIGATION BROCHOSCOPY N/A 11/05/2020   Procedure: ELECTROMAGNETIC NAVIGATION BRONCHOSCOPY;  Surgeon: Collene Gobble, MD;  Location: [place];  Service: Cardiopulmonary;  Laterality: N/A;  . ENDOBRONCHIAL ULTRASOUND N/A 11/05/2020   Procedure: ENDOBRONCHIAL ULTRASOUND;  Surgeon: Collene Gobble, MD;  Location: [facility] ENDOSCOPY;  Service: Cardiopulmonary;  Laterality: N/A;  . FINE NEEDLE ASPIRATION  11/05/2020   Procedure: FINE NEEDLE ASPIRATION (FNA) LINEAR;  Surgeon: Collene Gobble, MD;  Location: [place] ENDOSCOPY;  Service: Cardiopulmonary;;  . IR FLUORO GUIDE CV LINE RIGHT  07/27/2020  . IR THORACENTESIS ASP PLEURAL SPACE W/IMG GUIDE  10/24/2020  . IR THORACENTESIS ASP PLEURAL SPACE W/IMG GUIDE  10/25/2020  . IR THORACENTESIS ASP PLEURAL SPACE W/IMG GUIDE  10/31/2020  . IR US GUIDE VASC ACCESS RIGHT  07/27/2020  . ROBOT ASSISTED LAPAROSCOPIC NEPHRECTOMY Right 01/13/2017   Procedure: XI ROBOTIC ASSISTED LAPAROSCOPIC RADICAL NEPHRECTOMY WITH LYSIS OF ADHESION;  Surgeon: Alexis Frock, MD;  Location: [facility];  Service: Urology;  Laterality: Right;  Marland Kitchen VIDEO BRONCHOSCOPY N/A 11/05/2020   Procedure: VIDEO BRONCHOSCOPY WITH FLUORO;  Surgeon: Collene Gobble, MD;  Location: [facility] ENDOSCOPY;  Service: Cardiopulmonary;  Laterality: N/A;    Social History:   reports that she quit smoking about 20 years ago. Her smoking use included cigarettes. She has a 15.00 pack-year smoking history. She has never used smokeless tobacco. She reports current alcohol use. She reports that she does not use drugs.  No  Known Allergies  Family History  Problem Relation Age of Onset  . Hypertension Mother   . Pancreatic cancer Father   . Breast cancer Sister      Prior to Admission medications   Medication Sig Start Date End Date Taking? Authorizing Provider  amLODipine (NORVASC) 10 MG tablet Take 1 tablet (10 mg total) by mouth daily. 04/19/20   Biagio Borg, MD  ascorbic acid (VITAMIN C) 500 MG tablet Take 500 mg by mouth 2 (two) times daily.  08/31/20   [provider]  Black Currant Seed Oil 500 MG CAPS Take 500 mg by mouth every morning.     [provider]  carvedilol (COREG) 12.5 MG tablet Take 1 tablet (12.5 mg total) by mouth 2 (two) times daily with a meal. 11/28/20   Regalado, Belkys A, MD  Cholecalciferol (VITAMIN D3) 50 MCG (2000 UT) capsule Take 2,000 Units by mouth every morning. 08/31/20   [provider]  dexamethasone (DECADRON) 4 MG tablet Take 1 tablet (4 mg total) by mouth every 8 (eight) hours. 12/19/20   Dessa Phi, DO  levETIRAcetam (KEPPRA XR) 500 MG 24 hr tablet Take 1 tablet (500 mg total) by mouth daily. On day of dialysis, take it immediately after  dialysis. 12/19/20 01/18/21  Dessa Phi, DO  Multiple Vitamin (MULTIVITAMIN WITH MINERALS) TABS tablet Take 1 tablet by mouth every morning.     [provider]  Omega-3 Fatty Acids (FISH OIL PO) Take 1 capsule by mouth every morning.     [provider]  OXYGEN Inhale 2 L into the lungs continuous.    [provider]  pantoprazole (PROTONIX) 40 MG tablet Take 1 tablet (40 mg total) by mouth daily. 11/28/20   Regalado, Belkys A, MD  polyethylene glycol (MIRALAX / GLYCOLAX) 17 g packet Take 17 g by mouth daily as needed for mild constipation. 09/12/20   Domenic Polite, MD  prochlorperazine (COMPAZINE) 10 MG tablet Take 1 tablet (10 mg total) by mouth every 6 (six) hours as needed. 12/05/20   Heilingoetter, Cassandra L, PA-C  rosuvastatin (CRESTOR) 10 MG tablet Take 1 tablet (10 mg  total) by mouth daily. 09/26/20   Biagio Borg, MD  senna-docusate (SENOKOT-S) 8.6-50 MG tablet Take 1 tablet by mouth daily as needed for mild constipation. 09/12/20   Domenic Polite, MD  telmisartan (MICARDIS) 80 MG tablet Take 80 mg by mouth daily. 10/15/20   [provider]    Physical Exam: Vitals:   01/24/2021 0315 01/15/2021 0345 01/25/2021 0415 01/29/2021 0445  BP: 111/64 (!) 115/54 (!) 141/59 (!) 127/59  Pulse: (!) 104 100 (!) 101 98  Resp: (!) 21 (!) 21 (!) 21 17  Temp:      TempSrc:      SpO2: 100% 94% 97% 99%    Constitutional: NAD, calm  Eyes: PERTLA, lids and conjunctivae normal ENMT: Mucous membranes are moist. Posterior pharynx clear of any exudate or lesions.   Neck: normal, supple, no masses, no thyromegaly Respiratory: Absent breath sounds on left, no wheezing. Labored respirations. No pallor or cyanosis.  Cardiovascular: Rate ~100 and regular. No extremity edema.  Abdomen: No distension, no tenderness, soft. Bowel sounds active.  Musculoskeletal: no clubbing / cyanosis. Ecchymosis and edema involving RUE.    Skin: no significant rashes, lesions, ulcers on exposed surfaces. Warm, dry, well-perfused. Neurologic: CN 2-12 grossly intact. Sensation intact. Moving all extremities.  Psychiatric: Alert and oriented to person, place, and situation. Very pleasant and cooperative.    Labs and Imaging on Admission: I have personally reviewed following labs and imaging studies  CBC: Recent Labs  Lab 01/09/21 0953 02/09/2021 0340 01/24/2021 0433  WBC 46.0* 50.8*  --   NEUTROABS 43.3*  --   --   HGB 6.3* 8.1* 7.8*  HCT 20.2* 26.2* 23.0*  MCV 96.7 96.3  --   PLT 167 126*  --    Basic Metabolic Panel: Recent Labs  Lab 01/09/21 0953 01/23/2021 0340 02/08/2021 0433  NA 139 136 135  K 3.8 4.0 3.9  CL 97* 93*  --   CO2 32 27  --   GLUCOSE 122* 165*  --   BUN 24* 21  --   CREATININE 2.09* 1.95*  --   CALCIUM 9.0 7.7*  --    GFR: CrCl cannot be calculated (Unknown  ideal weight.). Liver Function Tests: Recent Labs  Lab 01/09/21 0953 02/06/2021 0340  AST 33 37  ALT 27 30  ALKPHOS 248* 232*  BILITOT 0.5 0.9  PROT 5.6* 4.8*  ALBUMIN 1.8* 1.5*   No results for input(s): LIPASE, AMYLASE in the last 168 hours. No results for input(s): AMMONIA in the last 168 hours. Coagulation Profile: Recent Labs  Lab 02/09/2021 0340  INR 1.2  Cardiac Enzymes: No results for input(s): CKTOTAL, CKMB, CKMBINDEX, TROPONINI in the last 168 hours. BNP (last 3 results) No results for input(s): PROBNP in the last 8760 hours. HbA1C: No results for input(s): HGBA1C in the last 72 hours. CBG: No results for input(s): GLUCAP in the last 168 hours. Lipid Profile: No results for input(s): CHOL, HDL, LDLCALC, TRIG, CHOLHDL, LDLDIRECT in the last 72 hours. Thyroid Function Tests: No results for input(s): TSH, T4TOTAL, FREET4, T3FREE, THYROIDAB in the last 72 hours. Anemia Panel: No results for input(s): VITAMINB12, FOLATE, FERRITIN, TIBC, IRON, RETICCTPCT in the last 72 hours. Urine analysis:    Component Value Date/Time   COLORURINE YELLOW 07/28/2020 1131   APPEARANCEUR HAZY (A) 07/28/2020 1131   LABSPEC 1.016 07/28/2020 1131   PHURINE 5.0 07/28/2020 1131   GLUCOSEU NEGATIVE 07/28/2020 [place] 04/26/2020 0909   HGBUR NEGATIVE 07/28/2020 1131   BILIRUBINUR NEGATIVE 07/28/2020 1131   KETONESUR NEGATIVE 07/28/2020 1131   PROTEINUR NEGATIVE 07/28/2020 1131   UROBILINOGEN 0.2 04/26/2020 0909   NITRITE NEGATIVE 07/28/2020 1131   LEUKOCYTESUR LARGE (A) 07/28/2020 1131   Sepsis Labs: @LABRCNTIP (procalcitonin:4,lacticidven:4) )No results found for this or any previous visit (from the past 240 hour(s)).   Radiological Exams on Admission: DG Chest Port 1 View  Result Date: 02/01/2021 CLINICAL DATA:  Shortness of breath EXAM: PORTABLE CHEST 1 VIEW COMPARISON:  12/17/2020 FINDINGS: Cardiac shadow is stable. Dialysis catheter is again seen. Opacification  of the left hemithorax is noted consistent with a large pleural effusion considerably worse than that seen on the prior exam. Small right pleural effusion is noted. IMPRESSION: Left hemithorax opacification secondary to large pleural effusion. Electronically Signed   By: Inez Catalina M.D.   On: 01/30/2021 01:52    EKG: Independently reviewed. Sinus tachycardia, rate 109, QTc 518 ms.   Assessment/Plan   1. Left pleural effusion; acute on chronic hypoxic respiratory failure  - Presents with SOB progressing over the past day, was saturating 70s on her usual 4 Lpm, and is found to have large pleural effusion opacification of left hemithorax - Patient is alert and fully oriented in ED and has decided that she does not want any further interventions, wants to continue oxygen via NRB for comfort; her family is supportive of her decision  - Continue supplemental O2, fentanyl as needed for pain or dyspnea, consult CM for possible discharge to residential hospice     2. Non-small cell lung cancer; brain metastases  - Diagnosed in Nov 2021 with large LLL mass, left hilar and mediastinal LAD, pleural metastases, and multiple brain metastases  - Chemotherapy appointments were cancelled last week when patient elected for hospice referral instead  - Continue Decadron for now    3. Seizures  - Continue Keppra   4. ESRD  - Patient indicates that she does not want to continue dialysis   5. COPD  - No wheezing, use combivent as needed    DVT prophylaxis: Comfort care only  Code Status: DNR/DNI  Level of Care: Level of care: Med-Surg Family Communication: Sister updated from ED  Disposition Plan:  Patient is from: SNF [employer])  Anticipated d/c is to: Hospice  Anticipated d/c date is: 2021/02/06 Patient currently: Requiring 15 Lpm supplemental O2 at rest  Consults called: None  Admission status: Inpatient     Vianne Bulls, MD [facility]  02/03/2021, 5:43 AM

## 2021-01-14 NOTE — ED Notes (Signed)
Attempted report x1. 

## 2021-01-14 NOTE — ED Notes (Signed)
Pt clothing and depends changed. PT given warm blankets. Cardiac monitoring removed as per comfort care orders.

## 2021-01-14 NOTE — ED Triage Notes (Addendum)
Pt arrived via [facility] from [place] after pt drops to 50% on RA and 70's on her home 4L. Pt is now maintaining in the low 90's on NRB. Pt has decub on left hip per facility. Pt also has hx of Lung CA with brain mets and kidney mets per EMS.

## 2021-01-14 NOTE — Progress Notes (Signed)
[facility])      This patient has been referred to [facility] for hospice services, pending admission when pt transferred to [facility].  ACC will continue to follow for any discharge planning needs and to coordinate admission onto hospice services if appropriate.     Thank you for the opportunity to participate in this patient's care.     Domenic Moras, BSN, RN [facility] Liaison   [phone] (24h on call)

## 2021-01-14 NOTE — Progress Notes (Signed)
CSW received hospice consult. CSW notes patient is COVID+ and [facility] does not accept COVID + patients. CSW attempting to contact patient, will request assistance of nurse. Referral will need to be sent to [place] with patient's permission.   Verlena Marlette LCSW

## 2021-01-14 NOTE — ED Notes (Signed)
Unable to obtain urinalysis because pt is anuric.

## 2021-01-14 NOTE — ED Notes (Signed)
Lunch Tray Ordered @ 1034. 

## 2021-01-15 DIAGNOSIS — J9621 Acute and chronic respiratory failure with hypoxia: Secondary | ICD-10-CM | POA: Diagnosis not present

## 2021-01-15 NOTE — TOC Progression Note (Signed)
Transition of Care [facility]) - Progression Note    Patient Details  Name: Cassandra Dyer MRN: [id number] Date of Birth: 1940-12-16  Transition of Care [facility]) CM/SW [place], LCSW Phone Number: 01/15/2021, 8:57 AM  Clinical Narrative:    Per Bing Ree, for hospice placement, patient needs to be off of isolation precautions and have no cannot have any active respiratory symptoms. CSW will follow up.       Expected Discharge Plan: [place] Barriers to Discharge: Hospice Bed not available  Expected Discharge Plan and Services Expected Discharge Plan: [place] In-house Referral: Clinical Social Work,Hospice / [place] Acute Care Choice: Hospice Living arrangements for the past 2 months: [place]                                       Social Determinants of Health (SDOH) Interventions    Readmission Risk Interventions Readmission Risk Prevention Plan 01/27/2021 08/08/2020  Transportation Screening Complete Complete  Medication Review [employer]) Referral to Pharmacy Complete  PCP or Specialist appointment within 3-5 days of discharge Complete Complete  HRI or Home Care Consult Complete Complete  SW Recovery Care/Counseling Consult Complete Complete  Palliative Care Screening Not Applicable Not Applicable  Skilled Nursing Facility Complete Not Applicable  Some recent data might be hidden

## 2021-01-15 NOTE — Progress Notes (Addendum)
[facility])  Ms. Hallstrom is not currently eligible for residential hospice at [facility].  We do not currently accept COVID + pt that are still on respiratory isolation.  ACC can reassess after isolation precautions have been stopped.   Venia Carbon RN, BSN, [facility] Liaison

## 2021-01-15 NOTE — Progress Notes (Signed)
PROGRESS NOTE                                                                                                                                                                                                             Patient Demographics:    Cassandra Dyer, is a 80 y.o. female, DOB - 1941/02/06, KCL:[id number]  Admit date - 02/08/2021   Admitting Physician Kerney Elbe, DO  Outpatient Primary MD for the patient is Biagio Borg, MD  LOS - 1  No chief complaint on file.      Brief Narrative (HPI from H&P)  : Cassandra Dyer is a 80 y.o. female with medical history significant for COPD with chronic hypoxic respiratory failure, remote renal cell carcinoma, hypertension, ESRD on hemodialysis, and stage IV non-small cell lung cancer with brain metastases, now presenting to emergency department for evaluation of shortness of breath and hypoxia.  She had been requiring 4 L/min of supplemental oxygen chronically but was becoming increasingly short of breath, in the ER found to have large left-sided pleural effusion presumably malignant, patient expressed her desire not to pursue aggressive measures and to stop her hemodialysis treatments, she wanted to be transition to residential hospice.   Subjective:    Cassandra Dyer today has, No headache, No chest pain, No abdominal pain - No Nausea, No new weakness tingling or numbness, mild SOB.   Assessment  & Plan :     1.  Acute on chronic large left-sided pleural effusion likely due to underlying metastatic non-small cell lung cancer with brain mets.  Patient has had very poor quality of life for the past several weeks, plan was discussed with her and her sister Enid Derry -they wish not to pursue any aggressive treatment, they wish to pursue full comfort measures and transition to residential hospice.  This will be done.  All treatment will be directed towards comfort.  2.  Brain mets causing seizures.  On Decadron and Keppra for  now.  3.  ESRD.  She has decided to stop dialysis.  4.  Underlying COPD.  Supportive care.  Pressure Injury 12/18/20 Buttocks Right;Left;Medial Stage 2 -  Partial thickness loss of dermis presenting as a shallow open injury with a red, pink wound bed without slough. (Active)  12/18/20 1748  Location: Buttocks  Location Orientation: Right;Left;Medial  Staging: Stage 2 -  Partial thickness loss of dermis presenting as a shallow open injury with a red, pink wound bed without slough.  Wound Description (Comments):  Present on Admission: Yes        Condition - Extremely Guarded  Family Communication  :  Eben Burow [phone] on 01/15/2021  Code Status :  DNR  Consults  :  Pall. Care  Procedures  :    PUD Prophylaxis : PPI  Disposition Plan  :    Status is: Inpatient  Remains inpatient appropriate because:IV treatments appropriate due to intensity of illness or inability to take PO   Dispo: The patient is from: Home              Anticipated d/c is to: Res.Hospice              Anticipated d/c date is: 2 days              Patient currently is not medically stable to d/c.   Difficult to place patient No    DVT Prophylaxis  : Comfort care  Lab Results  Component Value Date   PLT 126 (L) 01/22/2021    Diet :  Diet Order            Diet renal with fluid restriction Fluid restriction: 1200 mL Fluid; Room service appropriate? Yes; Fluid consistency: Thin  Diet effective now                  Inpatient Medications Scheduled Meds: . dexamethasone  4 mg Oral Q8H  . pantoprazole  40 mg Oral Daily   Continuous Infusions: . sodium chloride     PRN Meds:.sodium chloride, acetaminophen **OR** acetaminophen, bisacodyl, fentaNYL (SUBLIMAZE) injection, Ipratropium-Albuterol, LORazepam **OR** [DISCONTINUED] LORazepam **OR** LORazepam, ondansetron **OR** ondansetron (ZOFRAN) IV, polyvinyl alcohol, senna-docusate, temazepam  Antibiotics  :   Anti-infectives (From  admission, onward)   None          Objective:   Vitals:   01/27/2021 0415 01/23/2021 0445 01/22/2021 1044 01/28/2021 1200  BP: (!) 141/59 (!) 127/59  (!) 126/59  Pulse: (!) 101 98  99  Resp: (!) 21 17  18   Temp:   97.9 F (36.6 C)   TempSrc:   Oral   SpO2: 97% 99%  95%  Weight:   54 kg   Height:   5\' 2"  (1.575 m)     SpO2: 95 % O2 Flow Rate (L/min): 4 L/min  Wt Readings from Last 3 Encounters:  01/29/2021 54 kg  12/23/20 52.7 kg  12/12/20 51.8 kg    No intake or output data in the 24 hours ending 01/15/21 0240   Physical Exam  Awake and now mildly confused, moving all 4 extremities by herself  [place].AT,PERRAL Supple Neck,No JVD, No cervical lymphadenopathy appriciated.  Symmetrical Chest wall movement, reduced left-sided breath sounds RRR,No Gallops,Rubs or new Murmurs, No Parasternal Heave +ve B.Sounds, Abd Soft, No tenderness, No organomegaly appriciated, No rebound - guarding or rigidity. No Cyanosis, trace leg edema,    Pressure Injury 12/18/20 Buttocks Right;Left;Medial Stage 2 -  Partial thickness loss of dermis presenting as a shallow open injury with a red, pink wound bed without slough. (Active)  12/18/20 1748  Location: Buttocks  Location Orientation: Right;Left;Medial  Staging: Stage 2 -  Partial thickness loss of dermis presenting as a shallow open injury with a red, pink wound bed without slough.  Wound Description (Comments):   Present on Admission: Yes     Data Review:   Recent Labs  Lab 01/09/21 0953 01/29/2021 0340 02/07/2021 0433  WBC 46.0* 50.8*  --   HGB 6.3* 8.1* 7.8*  HCT  20.2* 26.2* 23.0*  PLT 167 126*  --   MCV 96.7 96.3  --   MCH 30.1 29.8  --   MCHC 31.2 30.9  --   RDW 15.9* 16.0*  --   LYMPHSABS 0.6*  --   --   MONOABS 1.3*  --   --   EOSABS 0.0  --   --   BASOSABS 0.0  --   --     Recent Labs  Lab 01/09/21 0952 01/09/21 0953 01/20/2021 0340 02/06/2021 0403 01/30/2021 0433  NA  --  139 136  --  135  K  --  3.8 4.0  --  3.9  CL   --  97* 93*  --   --   CO2  --  32 27  --   --   GLUCOSE  --  122* 165*  --   --   BUN  --  24* 21  --   --   CREATININE  --  2.09* 1.95*  --   --   CALCIUM  --  9.0 7.7*  --   --   AST  --  33 37  --   --   ALT  --  27 30  --   --   ALKPHOS  --  248* 232*  --   --   BILITOT  --  0.5 0.9  --   --   ALBUMIN  --  1.8* 1.5*  --   --   LATICACIDVEN  --   --   --  3.4*  --   INR  --   --  1.2  --   --   TSH 0.601  --   --   --   --   BNP  --   --  1,020.3*  --   --     Recent Labs  Lab 01/17/2021 0323 02/06/2021 0340  BNP  --  1,020.3*  SARSCOV2NAA POSITIVE*  --     ------------------------------------------------------------------------------------------------------------------ No results for input(s): CHOL, HDL, LDLCALC, TRIG, CHOLHDL, LDLDIRECT in the last 72 hours.  Lab Results  Component Value Date   HGBA1C 5.6 04/08/2020   ------------------------------------------------------------------------------------------------------------------ No results for input(s): TSH, T4TOTAL, T3FREE, THYROIDAB in the last 72 hours.  Invalid input(s): FREET3 ------------------------------------------------------------------------------------------------------------------ No results for input(s): VITAMINB12, FOLATE, FERRITIN, TIBC, IRON, RETICCTPCT in the last 72 hours.  Coagulation profile Recent Labs  Lab 01/22/2021 0340  INR 1.2    No results for input(s): DDIMER in the last 72 hours.  Cardiac Enzymes No results for input(s): CKMB, TROPONINI, MYOGLOBIN in the last 168 hours.  Invalid input(s): CK ------------------------------------------------------------------------------------------------------------------    Component Value Date/Time   BNP 1,020.3 (H) 02/09/2021 0340    Micro Results Recent Results (from the past 240 hour(s))  SARS Coronavirus 2 by RT PCR (hospital order, performed in [facility] hospital lab) Nasopharyngeal Nasopharyngeal Swab     Status: Abnormal    Collection Time: 02/10/2021  3:23 AM   Specimen: Nasopharyngeal Swab  Result Value Ref Range Status   SARS Coronavirus 2 POSITIVE (A) NEGATIVE Final    Comment: RESULT CALLED TO, READ BACK BY AND VERIFIED WITH: J. Elveria Rising [id number] 01/15/2020 T. TYSOR (NOTE) SARS-CoV-2 target nucleic acids are DETECTED  SARS-CoV-2 RNA is generally detectable in upper respiratory specimens  during the acute phase of infection.  Positive results are indicative  of the presence of the identified virus, but do not rule out bacterial infection or co-infection with other pathogens not detected  by the test.  Clinical correlation with patient history and  other diagnostic information is necessary to determine patient infection status.  The expected result is negative.  Fact Sheet for Patients:   StrictlyIdeas.no   Fact Sheet for Healthcare Providers:   BankingDealers.co.za    This test is not yet approved or cleared by the [place] FDA and  has been authorized for detection and/or diagnosis of SARS-CoV-2 by FDA under an Emergency Use Authorization (EUA).  This EUA will remain in effect (meaning this t est can be used) for the duration of  the COVID-19 declaration under Section 564(b)(1) of the Act, 21 U.S.C. section 360-bbb-3(b)(1), unless the authorization is terminated or revoked sooner.  Performed at [facility], [place] [address] [place], [place] [address]     Radiology Reports CT Head Wo Contrast  Result Date: 12/17/2020 CLINICAL DATA:  Mental status change EXAM: CT HEAD WITHOUT CONTRAST TECHNIQUE: Contiguous axial images were obtained from the base of the skull through the vertex without intravenous contrast. COMPARISON:  MRI 11/26/2020 FINDINGS: Brain: No acute interval hemorrhage. Stable ventricle size. Grossly similar edema within the left parietal white matter. Slightly dense metastatic lesions are again noted. 1.4 x 1.3 cm left parietal mass,  series 4, image number 26. Small 7 mm hyperdense mass within the left frontal lobe, series 4, image number 25. Hyperdense mass within the right lateral ventricle measuring 1.6 x 2.3 cm. Approximate 14 mm left cerebellar lesion vaguely visualized. The other metastatic lesions on MRI are better seen on MRI. No significant midline shift. Vascular: No hyperdense vessels.  Carotid vascular calcification. Skull: Normal. Negative for fracture or focal lesion. Sinuses/Orbits: No acute finding. Other: None IMPRESSION: 1. No definite acute interval change as compared with MRI from December 2021 allowing for limitations of inter modality comparison. Intracranial metastatic disease with grossly stable edema within the left parietal white matter. Only some of the metastatic lesions are appreciated on this non contrasted CT. 2. Mild chronic small vessel ischemic change of the white matter. Electronically Signed   By: Donavan Foil M.D.   On: 12/17/2020 19:41   MR BRAIN WO CONTRAST  Result Date: 12/17/2020 CLINICAL DATA:  Initial evaluation for acute seizure, history of metastatic lung carcinoma. EXAM: MRI HEAD WITHOUT CONTRAST TECHNIQUE: Multiplanar, multiecho pulse sequences of the brain and surrounding structures were obtained without intravenous contrast. COMPARISON:  Prior CT from earlier same day as well as recent brain MRI from 11/26/2020. FINDINGS: Brain: Again seen are multiple lesions scattered throughout the supratentorial and infratentorial brain, consistent with known intracranial metastatic disease. Overall number of these lesions appears grossly similar, although exact delineation limited due to lack of IV contrast. Associated vasogenic edema about a lesion centered at the posterior left frontoparietal region is similar to perhaps mildly increased. Additionally, there is mildly increased vasogenic edema seen about a few lesions at the left frontal lobe anteriorly (series 11, images 21, 22). Increased edema also  seen about a lesion at the peripheral left cerebellum (series 11, image 7). The lesion positioned along the posterior septum pellucidum may be slightly increased in size measuring 2.6 x 1.9 cm on today's exam, previously 2.4 x 1.7 cm when measured in a similar fashion. Overall, appearance suggest mild disease progression as compared to previous. No midline shift. Associated susceptibility artifact about a lesion at the left occipital lobe is stable from previous. No other associated hemorrhage. No evidence for acute or subacute infarct. No other diffusion abnormality to suggest changes related  to seizure. No other evidence for acute or chronic intracranial hemorrhage. No hydrocephalus or ventricular trapping. No extra-axial fluid collection. Pituitary gland suprasellar region within normal limits. Vascular: Major intracranial vascular flow voids are maintained. Skull and upper cervical spine: Craniocervical junction within normal limits. Bone marrow signal intensity normal. No scalp soft tissue abnormality. Sinuses/Orbits: Patient status post bilateral ocular lens replacement. No acute abnormality about the globes and orbital soft tissues. Paranasal sinuses are largely clear. No significant mastoid effusion. Other: None. IMPRESSION: 1. Probable mild disease progression as compared to previous MRI from 11/27/2019 as evidenced by mildly increased vasogenic edema about a few lesions situated at the left frontal lobe and left cerebellum as above. No midline shift or hydrocephalus. 2. No other acute intracranial abnormality. Electronically Signed   By: Jeannine Boga M.D.   On: 12/17/2020 23:52   DG Chest Port 1 View  Result Date: 01/26/2021 CLINICAL DATA:  Shortness of breath EXAM: PORTABLE CHEST 1 VIEW COMPARISON:  12/17/2020 FINDINGS: Cardiac shadow is stable. Dialysis catheter is again seen. Opacification of the left hemithorax is noted consistent with a large pleural effusion considerably worse than that  seen on the prior exam. Small right pleural effusion is noted. IMPRESSION: Left hemithorax opacification secondary to large pleural effusion. Electronically Signed   By: Inez Catalina M.D.   On: 02/02/2021 01:52   DG Chest Port 1 View  Result Date: 12/17/2020 CLINICAL DATA:  Altered mental status, leg swelling EXAM: PORTABLE CHEST 1 VIEW COMPARISON:  November 20, 2020 FINDINGS: Bilateral layering pleural effusions with adjacent atelectasis. Probable mild pulmonary edema. No pneumothorax. Stable cardiomediastinal contours. Right tunneled line is unchanged. IMPRESSION: Bilateral layering pleural effusions with adjacent atelectasis. Probable mild pulmonary edema. Electronically Signed   By: Macy Mis M.D.   On: 12/17/2020 19:46   EEG adult  Result Date: 12/18/2020 Lora Havens, MD     12/18/2020 10:10 AM Patient Name: CASEY MAXFIELD MRN: [id number] Epilepsy Attending: Lora Havens Referring Physician/Provider: Dr Neva Seat Date: 12/18/2020 Duration: 24.04 mins Patient history: 80 year old female who recently started radiation therapy for brain metastasis who presents with jerking type movement which would occur and last for a little bit before pausing for a little bit, on and off for about 5 minutes total. EEG to evaluate for seizure. Level of alertness: Awake, asleep AEDs during EEG study: LEV Technical aspects: This EEG study was done with scalp electrodes positioned according to the 10-20 International system of electrode placement. Electrical activity was acquired at a sampling rate of 500Hz  and reviewed with a high frequency filter of 70Hz  and a low frequency filter of 1Hz . EEG data were recorded continuously and digitally stored. Description: The posterior dominant rhythm consists of 8-9 Hz activity of moderate voltage (25-35 uV) seen predominantly in posterior head regions, symmetric and reactive to eye opening and eye closing. Sleep was characterized by vertex waves, sleep spindles (12 to  14 Hz), maximal frontocentral region. EEG showed intermittent generalized and maximal left frontal 3 to 6 Hz theta-delta slowing.  Sharp waves are also noted in left frontal region. Hyperventilation and photic stimulation were not performed.   ABNORMALITY -Sharp wave, left frontal region -Intermittent slow, generalized and maximal left frontal region IMPRESSION: This study showed evidence of epileptogenicity arising from left frontal region as well as cortical dysfunction in left frontal region likely secondary to underlying structural abnormalities.  Additionally there is evidence of mild diffuse encephalopathy, nonspecific etiology.  No seizures were seen throughout the recording. Priyanka O  Hortense Ramal   [place] EKG SITE RITE  Result Date: 12/19/2020 If Site Rite image not attached, placement could not be confirmed due to current cardiac rhythm.   Time Spent in minutes  30   Lala Lund M.D on 01/15/2021 at 8:12 AM  To page go to [email or web address]

## 2021-01-16 DIAGNOSIS — J9621 Acute and chronic respiratory failure with hypoxia: Secondary | ICD-10-CM | POA: Diagnosis not present

## 2021-01-19 LAB — CULTURE, BLOOD (SINGLE): Culture: NO GROWTH

## 2021-01-30 ENCOUNTER — Other Ambulatory Visit: Payer: Medicare Other

## 2021-01-30 ENCOUNTER — Ambulatory Visit: Payer: Medicare Other | Admitting: Internal Medicine

## 2021-01-30 ENCOUNTER — Ambulatory Visit: Payer: Medicare Other

## 2021-02-01 ENCOUNTER — Ambulatory Visit: Payer: Medicare Other

## 2021-02-11 ENCOUNTER — Ambulatory Visit: Payer: Medicare Other | Admitting: Internal Medicine

## 2021-02-11 NOTE — Death Summary Note (Signed)
[facility] Death Note                                                                                                                                                                                               Cassandra Dyer, is a 80 y.o. female, DOB - 10-22-1941, WOE:[id number]  Admit date - 01/21/2021   Admitting Physician Kerney Elbe, DO  Outpatient Primary MD for the patient is Biagio Borg, MD  LOS - 2  No chief complaint on file.      Notification: Biagio Borg, MD notified of death of 2021/02/08   Date and Time of Death - 02-08-2021- 5:10 am  Pronounced by - RN  History of present illness:   Cassandra Dyer is a 80 y.o. female with a history of -  COPD with chronic hypoxic respiratory failure, remote renal cell carcinoma, hypertension, ESRD on hemodialysis, and stage IV non-small cell lung cancer with brain metastases, now presenting to emergency department for evaluation of shortness of breath and hypoxia.  She had been requiring 4 L/min of supplemental oxygen chronically but was becoming increasingly short of breath, in the ER found to have large left-sided pleural effusion presumably malignant, patient expressed her desire not to pursue aggressive measures and to stop her hemodialysis treatments, she wanted to be transition to residential hospice, was admitted under comfort measures and passed away comfortably early morning of 02-08-21.   Final Diagnoses:  Cause if death - Metastatic stage IV non-small cell lung cancer  Signature  Lala Lund M.D on 2021/02/08 at 9:46 AM  [facility]  Office Phone [phone]  Total clinical and documentation time for today Under 30 minutes   Last Note              PROGRESS NOTE  Patient Demographics:    Cassandra Dyer, is a 80 y.o. female, DOB - 1941-02-21, WER:[id number]  Admit date - 01/27/2021   Admitting Physician Kerney Elbe, DO  Outpatient Primary MD for the patient is Biagio Borg, MD  LOS - 2  No chief complaint on file.      Brief Narrative (HPI from H&P)  : Cassandra Dyer is a 80 y.o. female with medical history significant for COPD with chronic hypoxic respiratory failure, remote renal cell carcinoma, hypertension, ESRD on hemodialysis, and stage IV non-small cell lung cancer with brain metastases, now presenting to emergency department for evaluation of shortness of breath and hypoxia.  She had been requiring 4 L/min of supplemental oxygen chronically but was becoming increasingly short of breath, in the ER found to have large left-sided pleural effusion presumably malignant, patient expressed her desire not to pursue aggressive measures and to stop her hemodialysis treatments, she wanted to be transition to residential hospice.   Subjective:    Cassandra Dyer today has, No headache, No chest pain, No abdominal pain - No Nausea, No new weakness tingling or numbness, mild SOB.   Assessment  & Plan :     1.  Acute on chronic large left-sided pleural effusion likely due to underlying metastatic non-small cell lung cancer with brain mets.  Patient has had very poor quality of life for the past several weeks, plan was discussed with her and her sister Enid Derry -they wish not to pursue any aggressive treatment, they wish to pursue full comfort measures and transition to residential hospice.  This will be done.  All treatment will be directed towards comfort.  2.  Brain mets causing seizures.  On Decadron and Keppra for now.  3.  ESRD.  She has decided to stop dialysis.  4.  Underlying COPD.  Supportive care.  Pressure Injury 12/18/20 Buttocks Right;Left;Medial Stage 2 -  Partial thickness loss of dermis  presenting as a shallow open injury with a red, pink wound bed without slough. (Active)  12/18/20 1748  Location: Buttocks  Location Orientation: Right;Left;Medial  Staging: Stage 2 -  Partial thickness loss of dermis presenting as a shallow open injury with a red, pink wound bed without slough.  Wound Description (Comments):   Present on Admission: Yes     Pressure Injury 01/15/21 Buttocks Right;Left;Medial Stage 2 -  Partial thickness loss of dermis presenting as a shallow open injury with a red, pink wound bed without slough. (Active)  01/15/21 2100  Location: Buttocks  Location Orientation: Right;Left;Medial  Staging: Stage 2 -  Partial thickness loss of dermis presenting as a shallow open injury with a red, pink wound bed without slough.  Wound Description (Comments):   Present on Admission:         Condition - Extremely Guarded  Family Communication  :  Eben Burow [phone] on 01/15/2021  Code Status :  DNR  Consults  :  Pall. Care  Procedures  :    PUD Prophylaxis : PPI  Disposition Plan  :    Status is: Inpatient  Remains inpatient appropriate because:IV treatments appropriate due to intensity of illness or inability to take PO   Dispo: The patient is from: Home              Anticipated d/c is to: Res.Hospice              Anticipated d/c date is: 2 days  Patient currently is not medically stable to d/c.   Difficult to place patient No    DVT Prophylaxis  : Comfort care  Lab Results  Component Value Date   PLT 126 (L) 02/04/2021    Diet :  Diet Order            Diet renal with fluid restriction Fluid restriction: 1200 mL Fluid; Room service appropriate? Yes; Fluid consistency: Thin  Diet effective now                  Inpatient Medications Scheduled Meds: . dexamethasone  4 mg Oral Q8H  . pantoprazole  40 mg Oral Daily   Continuous Infusions: . sodium chloride     PRN Meds:.sodium chloride, acetaminophen **OR**  acetaminophen, bisacodyl, fentaNYL (SUBLIMAZE) injection, Ipratropium-Albuterol, LORazepam **OR** [DISCONTINUED] LORazepam **OR** LORazepam, ondansetron **OR** ondansetron (ZOFRAN) IV, polyvinyl alcohol, senna-docusate, temazepam  Antibiotics  :   Anti-infectives (From admission, onward)   None          Objective:   Vitals:   01/15/21 1300 01/15/21 2258 01/15/21 2259 2021-01-28 0559  BP: (!) 134/51     Pulse: 100 (!) 112 (!) 115   Resp: 20  (!) 34   Temp: 97.8 F (36.6 C)  98.4 F (36.9 C)   TempSrc: Oral  Oral   SpO2: 94%  91%   Weight:    54 kg  Height:    5' 2.01" (1.575 m)    SpO2: 91 % O2 Flow Rate (L/min): 4 L/min  Wt Readings from Last 3 Encounters:  2021-01-28 54 kg  12/23/20 52.7 kg  12/12/20 51.8 kg     Intake/Output Summary (Last 24 hours) at 01-28-2021 0946 Last data filed at 01/15/2021 1417 Gross per 24 hour  Intake 120 ml  Output 1 ml  Net 119 ml     Physical Exam  Awake and now mildly confused, moving all 4 extremities by herself  [place].AT,PERRAL Supple Neck,No JVD, No cervical lymphadenopathy appriciated.  Symmetrical Chest wall movement, reduced left-sided breath sounds RRR,No Gallops,Rubs or new Murmurs, No Parasternal Heave +ve B.Sounds, Abd Soft, No tenderness, No organomegaly appriciated, No rebound - guarding or rigidity. No Cyanosis, trace leg edema,    Pressure Injury 12/18/20 Buttocks Right;Left;Medial Stage 2 -  Partial thickness loss of dermis presenting as a shallow open injury with a red, pink wound bed without slough. (Active)  12/18/20 1748  Location: Buttocks  Location Orientation: Right;Left;Medial  Staging: Stage 2 -  Partial thickness loss of dermis presenting as a shallow open injury with a red, pink wound bed without slough.  Wound Description (Comments):   Present on Admission: Yes     Pressure Injury 01/15/21 Buttocks Right;Left;Medial Stage 2 -  Partial thickness loss of dermis presenting as a shallow open injury with a  red, pink wound bed without slough. (Active)  01/15/21 2100  Location: Buttocks  Location Orientation: Right;Left;Medial  Staging: Stage 2 -  Partial thickness loss of dermis presenting as a shallow open injury with a red, pink wound bed without slough.  Wound Description (Comments):   Present on Admission:      Data Review:   Recent Labs  Lab 01/09/21 0953 01/23/2021 0340 02/08/2021 0433  WBC 46.0* 50.8*  --   HGB 6.3* 8.1* 7.8*  HCT 20.2* 26.2* 23.0*  PLT 167 126*  --   MCV 96.7 96.3  --   MCH 30.1 29.8  --   MCHC 31.2 30.9  --   RDW 15.9*  16.0*  --   LYMPHSABS 0.6*  --   --   MONOABS 1.3*  --   --   EOSABS 0.0  --   --   BASOSABS 0.0  --   --     Recent Labs  Lab 01/09/21 0952 01/09/21 0953 02/08/2021 0340 02/10/2021 0403 02/05/2021 0433  NA  --  139 136  --  135  K  --  3.8 4.0  --  3.9  CL  --  97* 93*  --   --   CO2  --  32 27  --   --   GLUCOSE  --  122* 165*  --   --   BUN  --  24* 21  --   --   CREATININE  --  2.09* 1.95*  --   --   CALCIUM  --  9.0 7.7*  --   --   AST  --  33 37  --   --   ALT  --  27 30  --   --   ALKPHOS  --  248* 232*  --   --   BILITOT  --  0.5 0.9  --   --   ALBUMIN  --  1.8* 1.5*  --   --   LATICACIDVEN  --   --   --  3.4*  --   INR  --   --  1.2  --   --   TSH 0.601  --   --   --   --   BNP  --   --  1,020.3*  --   --     Recent Labs  Lab 01/24/2021 0323 01/29/2021 0340  BNP  --  1,020.3*  SARSCOV2NAA POSITIVE*  --     ------------------------------------------------------------------------------------------------------------------ No results for input(s): CHOL, HDL, LDLCALC, TRIG, CHOLHDL, LDLDIRECT in the last 72 hours.  Lab Results  Component Value Date   HGBA1C 5.6 04/08/2020   ------------------------------------------------------------------------------------------------------------------ No results for input(s): TSH, T4TOTAL, T3FREE, THYROIDAB in the last 72 hours.  Invalid input(s):  FREET3 ------------------------------------------------------------------------------------------------------------------ No results for input(s): VITAMINB12, FOLATE, FERRITIN, TIBC, IRON, RETICCTPCT in the last 72 hours.  Coagulation profile Recent Labs  Lab 01/24/2021 0340  INR 1.2    No results for input(s): DDIMER in the last 72 hours.  Cardiac Enzymes No results for input(s): CKMB, TROPONINI, MYOGLOBIN in the last 168 hours.  Invalid input(s): CK ------------------------------------------------------------------------------------------------------------------    Component Value Date/Time   BNP 1,020.3 (H) 01/29/2021 0340    Micro Results Recent Results (from the past 240 hour(s))  SARS Coronavirus 2 by RT PCR (hospital order, performed in [facility] hospital lab) Nasopharyngeal Nasopharyngeal Swab     Status: Abnormal   Collection Time: 01/23/2021  3:23 AM   Specimen: Nasopharyngeal Swab  Result Value Ref Range Status   SARS Coronavirus 2 POSITIVE (A) NEGATIVE Final    Comment: RESULT CALLED TO, READ BACK BY AND VERIFIED WITH: J. Elveria Rising 6440 01/15/2020 T. TYSOR (NOTE) SARS-CoV-2 target nucleic acids are DETECTED  SARS-CoV-2 RNA is generally detectable in upper respiratory specimens  during the acute phase of infection.  Positive results are indicative  of the presence of the identified virus, but do not rule out bacterial infection or co-infection with other pathogens not detected by the test.  Clinical correlation with patient history and  other diagnostic information is necessary to determine patient infection status.  The expected result is negative.  Fact Sheet for Patients:   StrictlyIdeas.no  Fact Sheet for Healthcare Providers:   BankingDealers.co.za    This test is not yet approved or cleared by the [place] FDA and  has been authorized for detection and/or diagnosis of SARS-CoV-2 by FDA under an  Emergency Use Authorization (EUA).  This EUA will remain in effect (meaning this t est can be used) for the duration of  the COVID-19 declaration under Section 564(b)(1) of the Act, 21 U.S.C. section 360-bbb-3(b)(1), unless the authorization is terminated or revoked sooner.  Performed at [facility], [place] [address], [place], [place] [address]   Culture, blood (single) w Reflex to ID Panel     Status: None (Preliminary result)   Collection Time: 01/23/2021  3:40 AM   Specimen: BLOOD  Result Value Ref Range Status   Specimen Description BLOOD LEFT HAND  Final   Special Requests   Final    BOTTLES DRAWN AEROBIC AND ANAEROBIC Blood Culture results may not be optimal due to an inadequate volume of blood received in culture bottles   Culture   Final    NO GROWTH 2 DAYS Performed at [facility], [place] [address], [place], [place] [address]    Report Status PENDING  Incomplete    Radiology Reports CT Head Wo Contrast  Result Date: 12/17/2020 CLINICAL DATA:  Mental status change EXAM: CT HEAD WITHOUT CONTRAST TECHNIQUE: Contiguous axial images were obtained from the base of the skull through the vertex without intravenous contrast. COMPARISON:  MRI 11/26/2020 FINDINGS: Brain: No acute interval hemorrhage. Stable ventricle size. Grossly similar edema within the left parietal white matter. Slightly dense metastatic lesions are again noted. 1.4 x 1.3 cm left parietal mass, series 4, image number 26. Small 7 mm hyperdense mass within the left frontal lobe, series 4, image number 25. Hyperdense mass within the right lateral ventricle measuring 1.6 x 2.3 cm. Approximate 14 mm left cerebellar lesion vaguely visualized. The other metastatic lesions on MRI are better seen on MRI. No significant midline shift. Vascular: No hyperdense vessels.  Carotid vascular calcification. Skull: Normal. Negative for fracture or focal lesion. Sinuses/Orbits: No acute finding. Other: None IMPRESSION: 1. No definite  acute interval change as compared with MRI from December 2021 allowing for limitations of inter modality comparison. Intracranial metastatic disease with grossly stable edema within the left parietal white matter. Only some of the metastatic lesions are appreciated on this non contrasted CT. 2. Mild chronic small vessel ischemic change of the white matter. Electronically Signed   By: Donavan Foil M.D.   On: 12/17/2020 19:41   MR BRAIN WO CONTRAST  Result Date: 12/17/2020 CLINICAL DATA:  Initial evaluation for acute seizure, history of metastatic lung carcinoma. EXAM: MRI HEAD WITHOUT CONTRAST TECHNIQUE: Multiplanar, multiecho pulse sequences of the brain and surrounding structures were obtained without intravenous contrast. COMPARISON:  Prior CT from earlier same day as well as recent brain MRI from 11/26/2020. FINDINGS: Brain: Again seen are multiple lesions scattered throughout the supratentorial and infratentorial brain, consistent with known intracranial metastatic disease. Overall number of these lesions appears grossly similar, although exact delineation limited due to lack of IV contrast. Associated vasogenic edema about a lesion centered at the posterior left frontoparietal region is similar to perhaps mildly increased. Additionally, there is mildly increased vasogenic edema seen about a few lesions at the left frontal lobe anteriorly (series 11, images 21, 22). Increased edema also seen about a lesion at the peripheral left cerebellum (series 11, image 7). The lesion positioned along the posterior  septum pellucidum may be slightly increased in size measuring 2.6 x 1.9 cm on today's exam, previously 2.4 x 1.7 cm when measured in a similar fashion. Overall, appearance suggest mild disease progression as compared to previous. No midline shift. Associated susceptibility artifact about a lesion at the left occipital lobe is stable from previous. No other associated hemorrhage. No evidence for acute or  subacute infarct. No other diffusion abnormality to suggest changes related to seizure. No other evidence for acute or chronic intracranial hemorrhage. No hydrocephalus or ventricular trapping. No extra-axial fluid collection. Pituitary gland suprasellar region within normal limits. Vascular: Major intracranial vascular flow voids are maintained. Skull and upper cervical spine: Craniocervical junction within normal limits. Bone marrow signal intensity normal. No scalp soft tissue abnormality. Sinuses/Orbits: Patient status post bilateral ocular lens replacement. No acute abnormality about the globes and orbital soft tissues. Paranasal sinuses are largely clear. No significant mastoid effusion. Other: None. IMPRESSION: 1. Probable mild disease progression as compared to previous MRI from 11/27/2019 as evidenced by mildly increased vasogenic edema about a few lesions situated at the left frontal lobe and left cerebellum as above. No midline shift or hydrocephalus. 2. No other acute intracranial abnormality. Electronically Signed   By: Jeannine Boga M.D.   On: 12/17/2020 23:52   DG Chest Port 1 View  Result Date: 02/01/2021 CLINICAL DATA:  Shortness of breath EXAM: PORTABLE CHEST 1 VIEW COMPARISON:  12/17/2020 FINDINGS: Cardiac shadow is stable. Dialysis catheter is again seen. Opacification of the left hemithorax is noted consistent with a large pleural effusion considerably worse than that seen on the prior exam. Small right pleural effusion is noted. IMPRESSION: Left hemithorax opacification secondary to large pleural effusion. Electronically Signed   By: Inez Catalina M.D.   On: 01/23/2021 01:52   DG Chest Port 1 View  Result Date: 12/17/2020 CLINICAL DATA:  Altered mental status, leg swelling EXAM: PORTABLE CHEST 1 VIEW COMPARISON:  November 20, 2020 FINDINGS: Bilateral layering pleural effusions with adjacent atelectasis. Probable mild pulmonary edema. No pneumothorax. Stable cardiomediastinal  contours. Right tunneled line is unchanged. IMPRESSION: Bilateral layering pleural effusions with adjacent atelectasis. Probable mild pulmonary edema. Electronically Signed   By: Macy Mis M.D.   On: 12/17/2020 19:46   EEG adult  Result Date: 12/18/2020 Lora Havens, MD     12/18/2020 10:10 AM Patient Name: Cassandra Dyer MRN: [id number] Epilepsy Attending: Lora Havens Referring Physician/Provider: Dr Neva Seat Date: 12/18/2020 Duration: 24.04 mins Patient history: 80 year old female who recently started radiation therapy for brain metastasis who presents with jerking type movement which would occur and last for a little bit before pausing for a little bit, on and off for about 5 minutes total. EEG to evaluate for seizure. Level of alertness: Awake, asleep AEDs during EEG study: LEV Technical aspects: This EEG study was done with scalp electrodes positioned according to the 10-20 International system of electrode placement. Electrical activity was acquired at a sampling rate of 500Hz  and reviewed with a high frequency filter of 70Hz  and a low frequency filter of 1Hz . EEG data were recorded continuously and digitally stored. Description: The posterior dominant rhythm consists of 8-9 Hz activity of moderate voltage (25-35 uV) seen predominantly in posterior head regions, symmetric and reactive to eye opening and eye closing. Sleep was characterized by vertex waves, sleep spindles (12 to 14 Hz), maximal frontocentral region. EEG showed intermittent generalized and maximal left frontal 3 to 6 Hz theta-delta slowing.  Sharp waves are also noted in  left frontal region. Hyperventilation and photic stimulation were not performed.   ABNORMALITY -Sharp wave, left frontal region -Intermittent slow, generalized and maximal left frontal region IMPRESSION: This study showed evidence of epileptogenicity arising from left frontal region as well as cortical dysfunction in left frontal region likely secondary to  underlying structural abnormalities.  Additionally there is evidence of mild diffuse encephalopathy, nonspecific etiology.  No seizures were seen throughout the recording. Priyanka O Yadav   [place] EKG SITE RITE  Result Date: 12/19/2020 If Site Rite image not attached, placement could not be confirmed due to current cardiac rhythm.   Time Spent in minutes  30   Lala Lund M.D on 2021-02-10 at 9:46 AM  To page go to [email or web address]

## 2021-02-11 NOTE — Progress Notes (Signed)
   02/10/21 0300  Glasgow Coma Scale  Eye Opening 2  Best Verbal Response (NON-intubated) 3  Best Motor Response 4  Glasgow Coma Scale Score 9   Patient obtunded during nursing rounds, elevated respiration rate noted.  Patient repositioned, pain medication administered.  MD notified.

## 2021-02-11 NOTE — Progress Notes (Addendum)
RN and nurse tech at patient's bedside.  Cheynne-stokes respirations noted initially.  Patient's respirations ceased.  Patient not responsive to voice, touch or pain.  Patient not responsive to sternal rub.  No apical pulse auscultated for 3 minutes.  Cardiac rhythm verified with bedside monitor with Elmyra Ricks charge RN- asystole noted.  No rate or rhythm compatible with life noted.  Patient on comfort care measures.  RN to pronounce death orders in place.  Cardiac death pronounced at 20 by this RN and Rodolph Bong RN (charge RN).    Post-mortem care provided by nurse tech and this RN.  Patient's sister, Bryley Chrisman, notified of patient status at [phone], questions and concerns have been addressed.  Enid Derry verifies that funeral home is [facility] on [address].  Sister requests that patient's belongings (yellow t-shirt and cell phone) be brought to front lobby for pick up.  [place] [phone]) called at [facility], spoke to [place] Dec.  Reference number #[id number].  Patient brought to morgue by this RN and nurse tech at [employer].

## 2021-02-11 NOTE — Progress Notes (Signed)
   2021-01-18 0501  Respiratory  Respiratory (WDL) X  Respiratory Pattern Irregular;Labored;Cheyne-Stokes  Chest Assessment Chest expansion symmetrical  Bilateral Breath Sounds Diminished  R Upper  Breath Sounds Clear;Diminished   Change in respirations noted, Dr.Rathore in department and notified.  Patient is unresponsive to voice and touch, patient responsive to pain.

## 2021-02-11 NOTE — Progress Notes (Signed)
   2021-01-25 0559  Attending Physican Contact  Attending Physician Notified Y  Attending Physician (First and Last Name) Lala Lund  Will the above attending physician sign death certificate? Yes  Post Mortem Checklist  Date of Death 25-Jan-2021  Time of Death 0510  Pronounced By Cristino Martes & nicole lightner rn  Next of kin notified Yes  Name of next of kin notified of death Endia Moncur  Contact Person's Relationship to Patient Sister  Contact Person's Phone Number [phone]  Was the patient a No Code Blue or a Limited Code Blue? Yes  Did the patient die unattended? No (primary RN and primary NT at bedside)  Patient restrained? Not applicable  Height 5' 4.73" (1.575 m)  Weight 54 kg  Body preparation complete Y (IV and CVC removed)  [facility]  Notification Date 01-25-2021  Notification Time [place] [facility]or Service Number [phone]  Is patient a potential donor? N  Autopsy  Autopsy requested by N/A  Patient and Hospital Property Returned  Patient belongings from bedside/safe/pharmacy returned  Yes  Valuables returned to? sister to pick up at front lobby  Specify valuables returned T-shirt and cell phone  Dermatherapy linen/gowns NOT sent with patient or transporter Not applicable  Notifications  Patient Placement notified that Post Mortem checklist is complete Yes  Patient Placement notified body transferred Transported to [place]  Is this a medical examiner's case? [facility] home name/address/phone # [place] [phone]  Planned location of pickup [place]

## 2021-02-11 NOTE — Plan of Care (Signed)
  Problem: Education: Goal: Knowledge of risk factors and measures for prevention of condition will improve Outcome: Progressing   Problem: Coping: Goal: Psychosocial and spiritual needs will be supported Outcome: Progressing   Problem: Respiratory: Goal: Will maintain a patent airway Outcome: Progressing Goal: Complications related to the disease process, condition or treatment will be avoided or minimized Outcome: Progressing   Problem: Education: Goal: Knowledge of the prescribed therapeutic regimen will improve Outcome: Progressing   Problem: Coping: Goal: Ability to identify and develop effective coping behavior will improve Outcome: Progressing   Problem: Clinical Measurements: Goal: Quality of life will improve Outcome: Progressing   Problem: Respiratory: Goal: Verbalizations of increased ease of respirations will increase Outcome: Progressing   Problem: Role Relationship: Goal: Family's ability to cope with current situation will improve Outcome: Progressing Goal: Ability to verbalize concerns, feelings, and thoughts to partner or family member will improve Outcome: Progressing   Problem: Pain Management: Goal: Satisfaction with pain management regimen will improve Outcome: Progressing

## 2021-02-11 DEATH — deceased

## 2022-04-06 IMAGING — MR MR HEAD W/O CM
9 of 13 series · 30 of 48 positions shown · non-contrast
Comparison: Brain MRI without contrast 01/23/2020.

CLINICAL DATA: 79-year-old female with cerebral metastatic disease.
Postoperative day 2 ORIF for pathologic right femoral fracture.

EXAM:
MRI HEAD WITHOUT CONTRAST
TECHNIQUE: Multiplanar, multiecho pulse sequences of the brain and surrounding
structures were obtained without intravenous contrast.

[Series 2: DWI · axial · 3.0mm · 0.94mm/px · z∈[-37,+124]mm · 7 of 110 slices shown (1 of 4)]
[im 1/110]
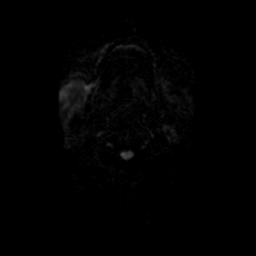
[im 19/110]
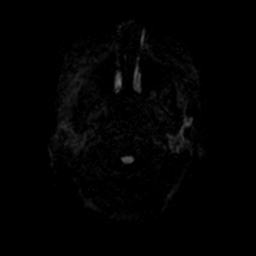
[im 37/110]
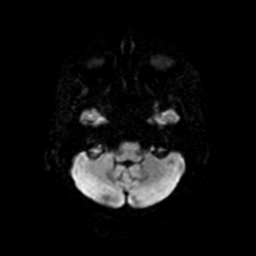
[im 55/110]
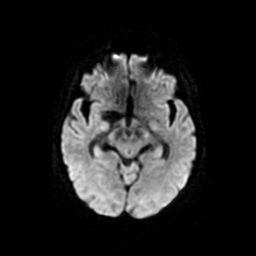
[im 73/110]
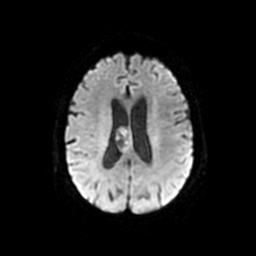
[im 91/110]
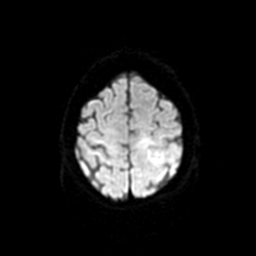
[im 110/110]
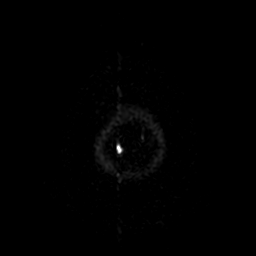

[Series 3: DWI · coronal · 4.0mm · 0.94mm/px · 4 of 74 slices shown (2 of 4)]
[im 1/74]
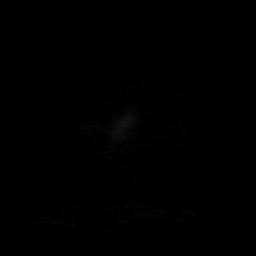
[im 25/74]
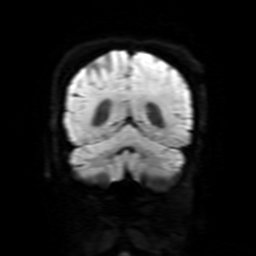
[im 49/74]
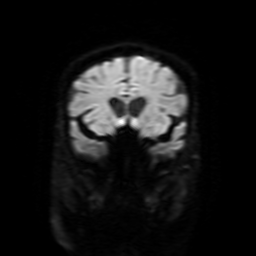
[im 74/74]
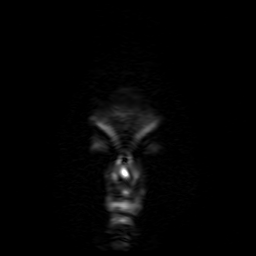

[Series 4: FLAIR · sagittal · 5.0mm · 0.23mm/px · 1 of 23 slices shown (1 of 2)]
[im 1/23]
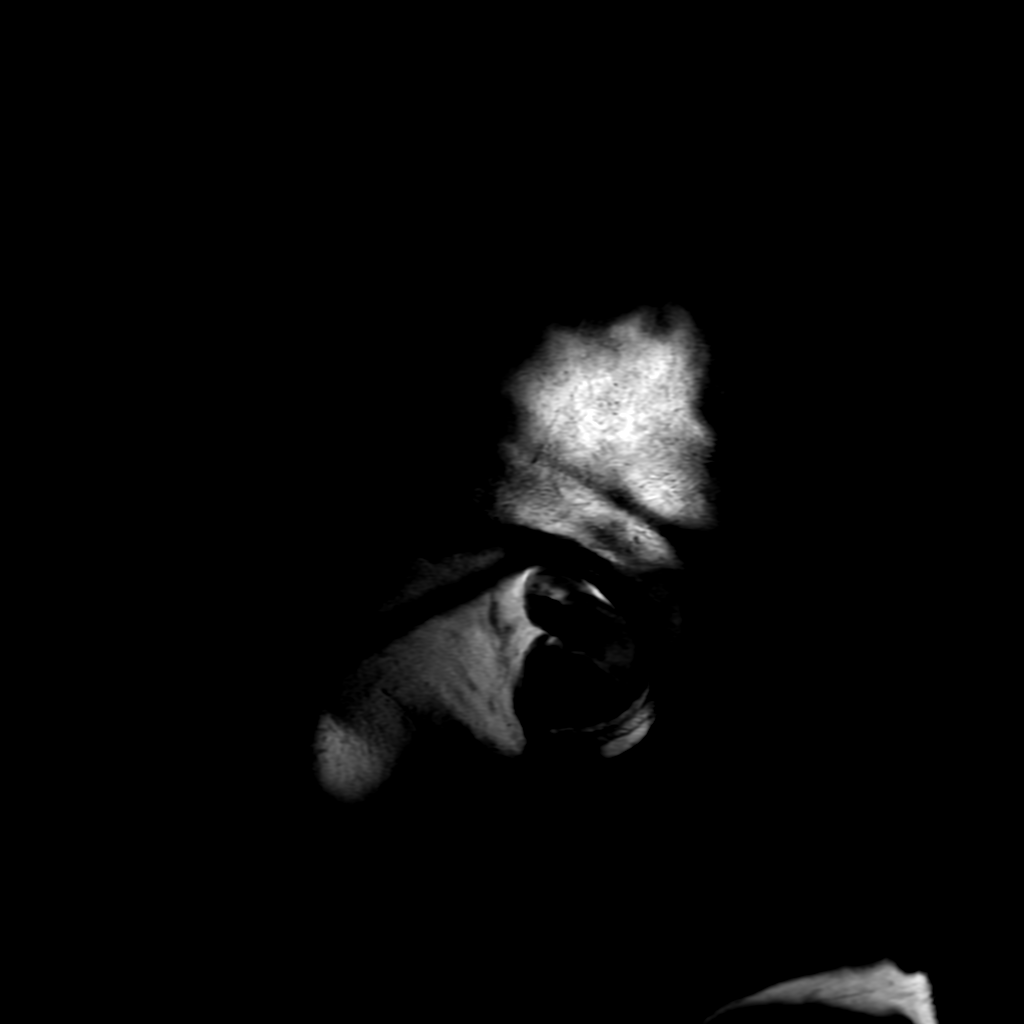

[Series 5: T2 · axial · 5.0mm · 0.23mm/px · 1 of 28 slices shown]
[im 1/28]
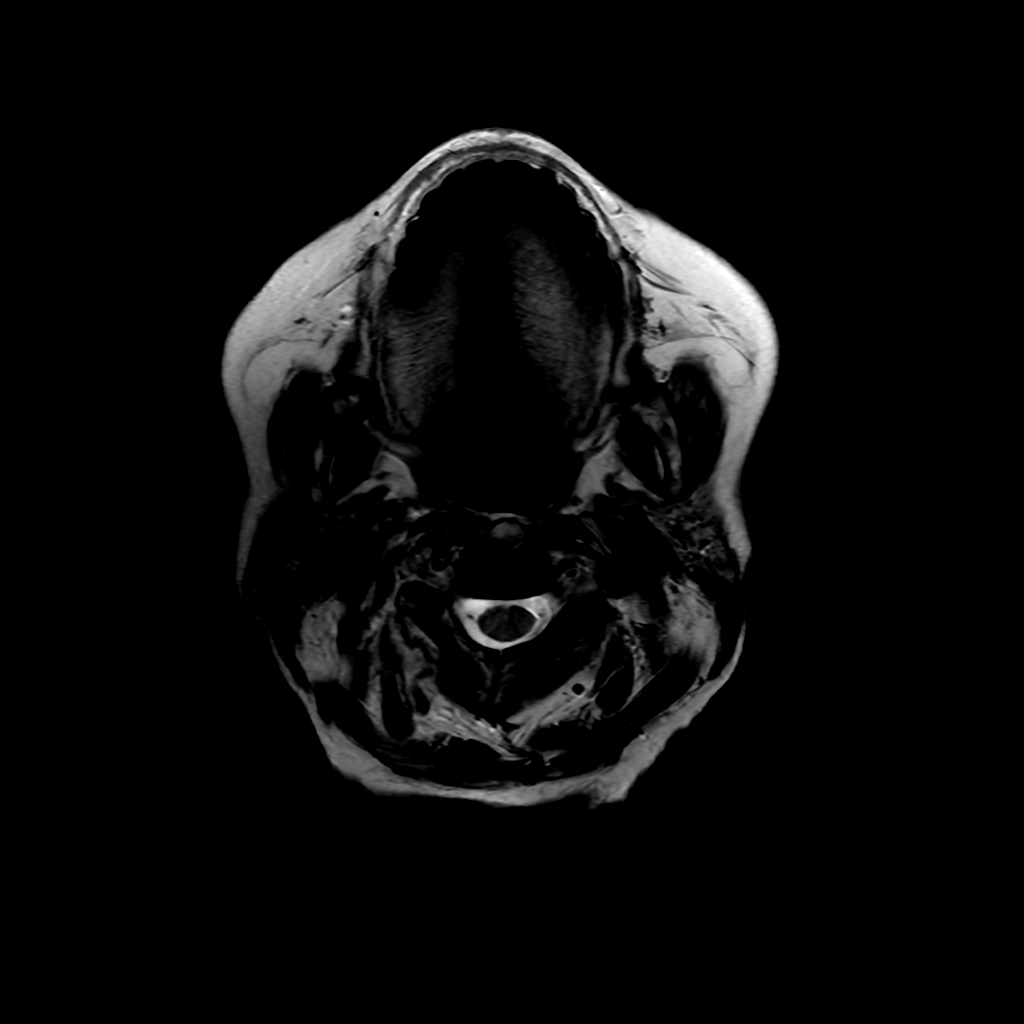

[Series 6: FLAIR · axial · 3.0mm · 0.45mm/px · z∈[-38,+124]mm · 2 of 28 slices shown (2 of 2)]
[im 1/28]
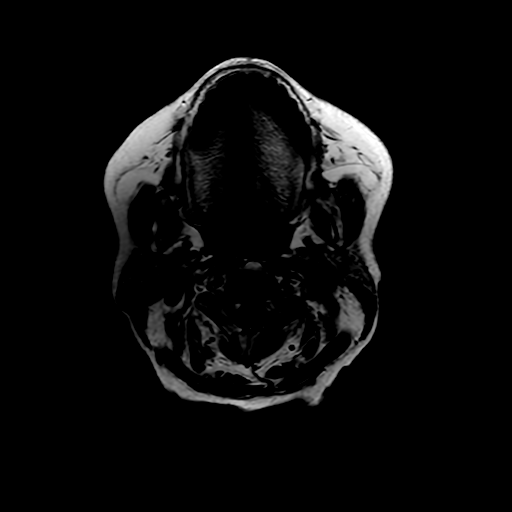
[im 28/28]
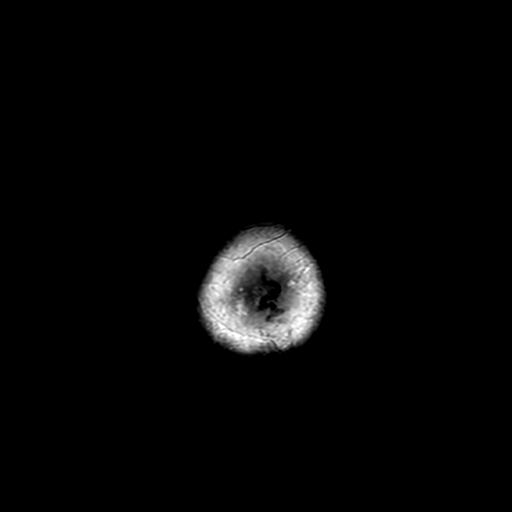

[Series 210: DWI · axial · 3.0mm · 0.94mm/px · z∈[-37,+124]mm · 6 of 109 slices shown (3 of 4)]
[im 1/109]
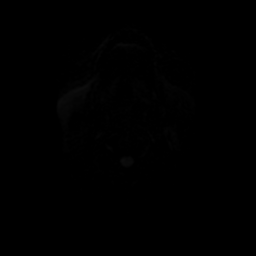
[im 22/109]
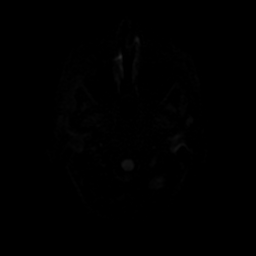
[im 44/109]
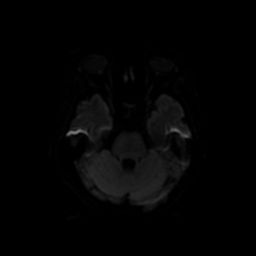
[im 65/109]
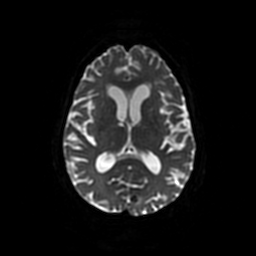
[im 87/109]
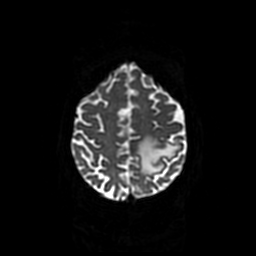
[im 109/109]
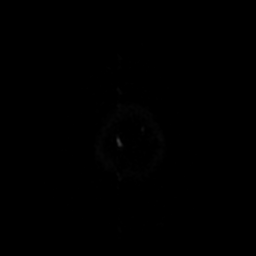

[Series 250: ADC · axial · 3.0mm · 0.94mm/px · z∈[-37,+124]mm · 3 of 55 slices shown (1 of 2)]
[im 1/55]
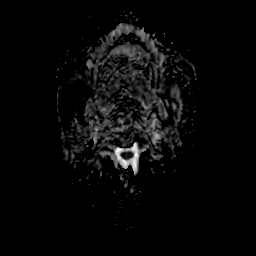
[im 28/55]
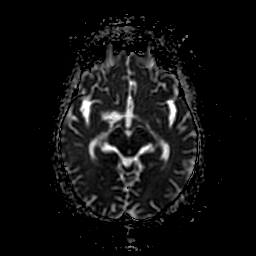
[im 55/55]
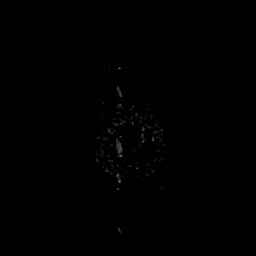

[Series 310: DWI · coronal · 4.0mm · 0.94mm/px · 4 of 74 slices shown (4 of 4)]
[im 1/74]
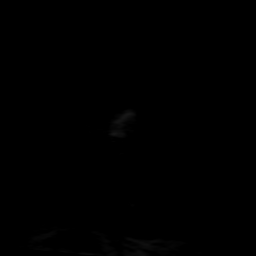
[im 25/74]
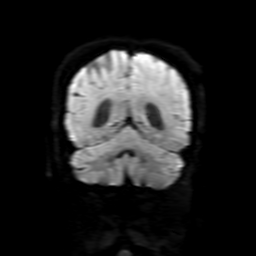
[im 49/74]
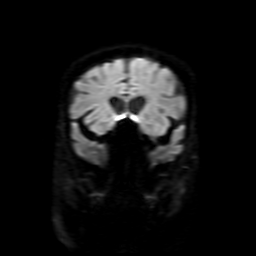
[im 74/74]
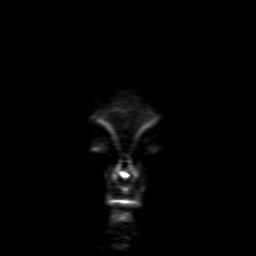

[Series 350: ADC · coronal · 4.0mm · 0.94mm/px · 2 of 37 slices shown (2 of 2)]
[im 1/37]
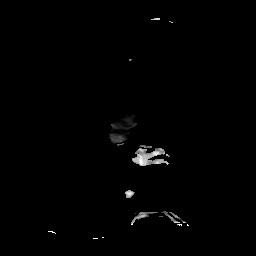
[im 37/37]
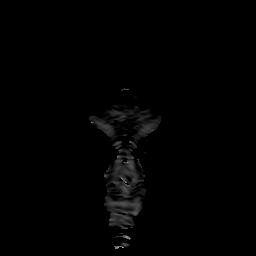

[30 of 48 positions shown; findings below may reference images not displayed]

FINDINGS: Brain: Confluent vasogenic edema in the posterior left frontal and
superior left parietal lobes surrounding a lobulated 14 mm presumed
isointense cerebral metastasis is stable from 11/21/2020. No
significant regional mass effect.

Other predominantly T2 dark, FLAIR heterogeneous brain masses appear
stable, some with associated hemosiderin as before (medial left
occipital lobe). And additional lesions are possible given the
absence of IV contrast on both of these exams. However, one lesion
of the anterior left frontal lobe has substantially regressed in the
short-term based on T2 and FLAIR imaging (series 6, image 21) with
resolved adjacent edema.

No new areas of vasogenic edema. No significant intracranial mass
effect. No superimposed restricted diffusion to suggest acute
infarction. No ventriculomegaly, extra-axial collection or acute
intracranial hemorrhage. Cervicomedullary junction and pituitary are
within normal limits.

Vascular: Major intracranial vascular flow voids are stable.
Dominant right vertebral artery again suspected.

Skull and upper cervical spine: Visualized bone marrow signal is
within normal limits. Grossly normal visible cervical spine.

Sinuses/Orbits: Stable, negative.

Other: Mastoids remain clear. Grossly normal visible internal
auditory structures.
IMPRESSION: 1. Stable metastatic disease related cerebral edema since the
noncontrast scan on 11/21/2020. No new or progressive lesions are
evident in the absence of contrast. Postcontrast imaging likely
would identify additional small metastases.

2. Regression of a single small anterior left frontal lobe lesion
since 11/21/2020 with largely resolved edema there.

3. No new intracranial abnormality.
